# Patient Record
Sex: Male | Born: 1960 | Race: White | Hispanic: No | Smoking: Current every day smoker
Health system: Southern US, Community
[De-identification: ages and names within clinical notes are randomized; demographics above are authoritative.]

## PROBLEM LIST (undated history)

## (undated) DIAGNOSIS — R0600 Dyspnea, unspecified: Secondary | ICD-10-CM

## (undated) DIAGNOSIS — J449 Chronic obstructive pulmonary disease, unspecified: Secondary | ICD-10-CM

## (undated) DIAGNOSIS — M1990 Unspecified osteoarthritis, unspecified site: Secondary | ICD-10-CM

## (undated) DIAGNOSIS — R519 Headache, unspecified: Secondary | ICD-10-CM

## (undated) DIAGNOSIS — Z8719 Personal history of other diseases of the digestive system: Secondary | ICD-10-CM

## (undated) DIAGNOSIS — F209 Schizophrenia, unspecified: Secondary | ICD-10-CM

## (undated) DIAGNOSIS — M5090 Cervical disc disorder, unspecified, unspecified cervical region: Secondary | ICD-10-CM

## (undated) DIAGNOSIS — I10 Essential (primary) hypertension: Secondary | ICD-10-CM

## (undated) DIAGNOSIS — F4310 Post-traumatic stress disorder, unspecified: Secondary | ICD-10-CM

## (undated) DIAGNOSIS — K649 Unspecified hemorrhoids: Secondary | ICD-10-CM

## (undated) DIAGNOSIS — K219 Gastro-esophageal reflux disease without esophagitis: Secondary | ICD-10-CM

## (undated) DIAGNOSIS — T783XXA Angioneurotic edema, initial encounter: Secondary | ICD-10-CM

## (undated) DIAGNOSIS — N429 Disorder of prostate, unspecified: Secondary | ICD-10-CM

## (undated) HISTORY — DX: Essential (primary) hypertension: I10

## (undated) HISTORY — DX: Disorder of prostate, unspecified: N42.9

## (undated) HISTORY — DX: Unspecified osteoarthritis, unspecified site: M19.90

## (undated) HISTORY — PX: FOOT SURGERY: SHX648

## (undated) HISTORY — PX: EYE SURGERY: SHX253

## (undated) HISTORY — DX: Chronic obstructive pulmonary disease, unspecified: J44.9

---
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
## 2005-03-15 ENCOUNTER — Emergency Department: Payer: Self-pay | Admitting: Emergency Medicine

## 2005-07-20 ENCOUNTER — Emergency Department: Payer: Self-pay | Admitting: Internal Medicine

## 2005-09-26 ENCOUNTER — Ambulatory Visit: Payer: Self-pay | Admitting: Unknown Physician Specialty

## 2005-09-26 IMAGING — RF DG UGI W/ KUB
2 series · 15 of 24 positions shown · non-contrast
Comparison: none

REASON FOR EXAM: Abdominal pain, epigastric
COMMENTS:

[Series 1: run · 20 acquisitions, 14 frames shown]
[im 1/20]
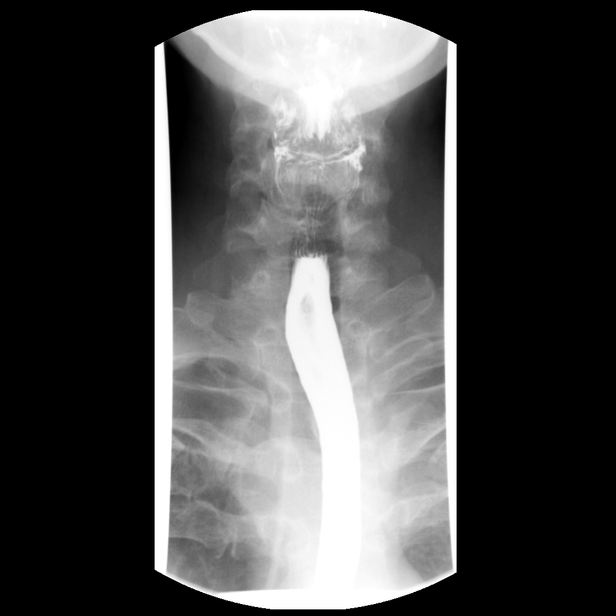
[im 1/20]
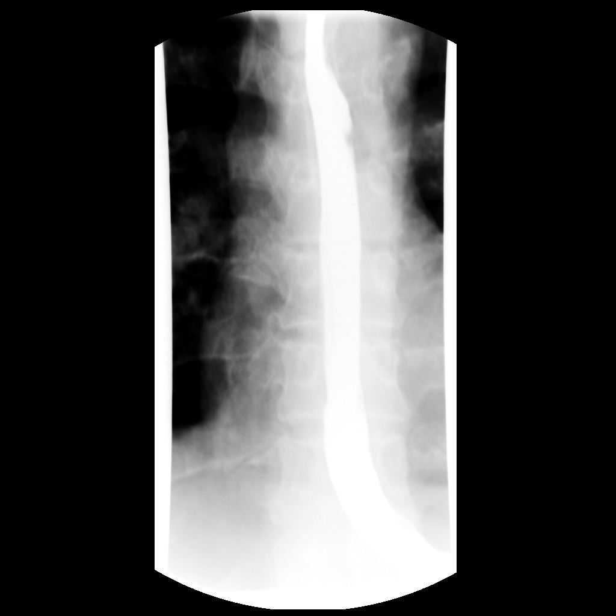
[im 2/20]
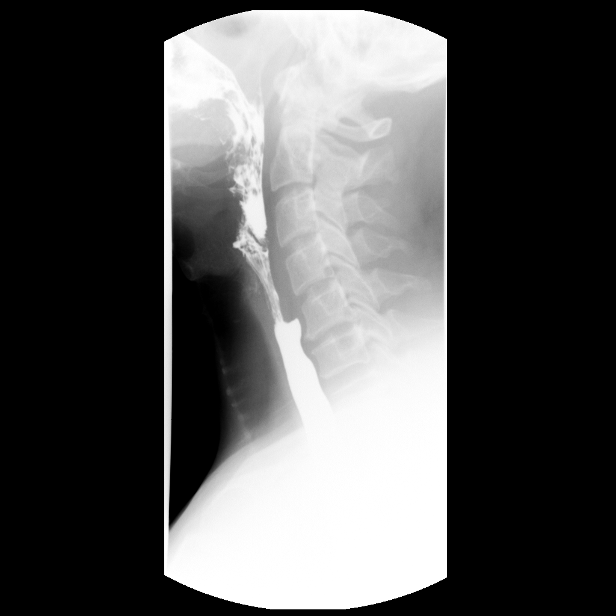
[im 2/20]
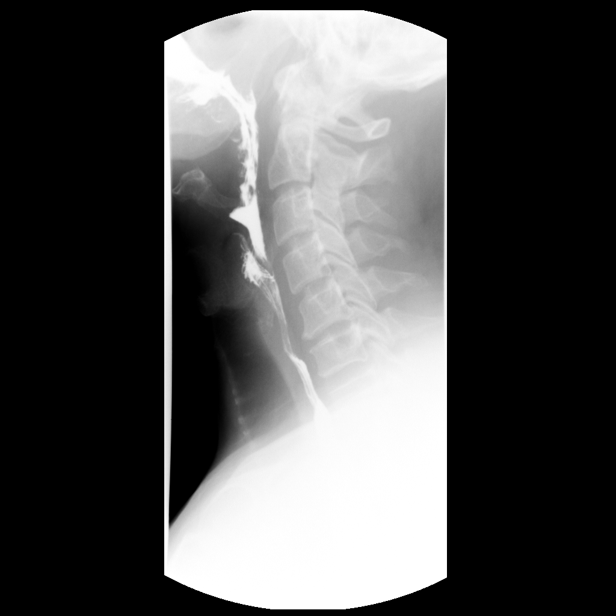
[im 3/20]
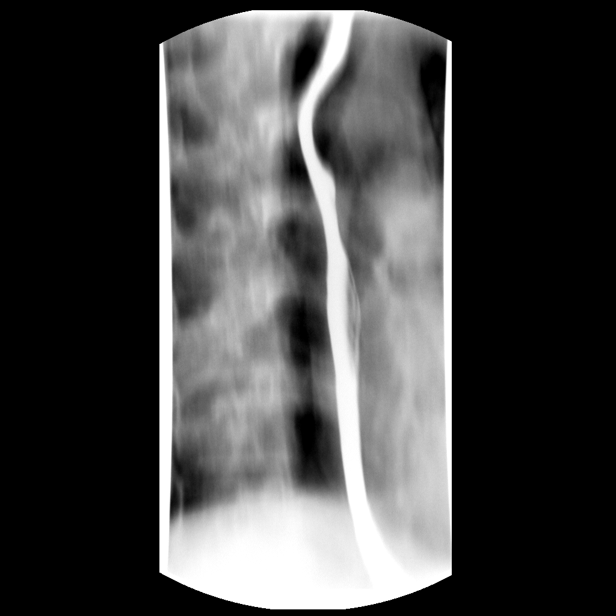
[im 3/20]
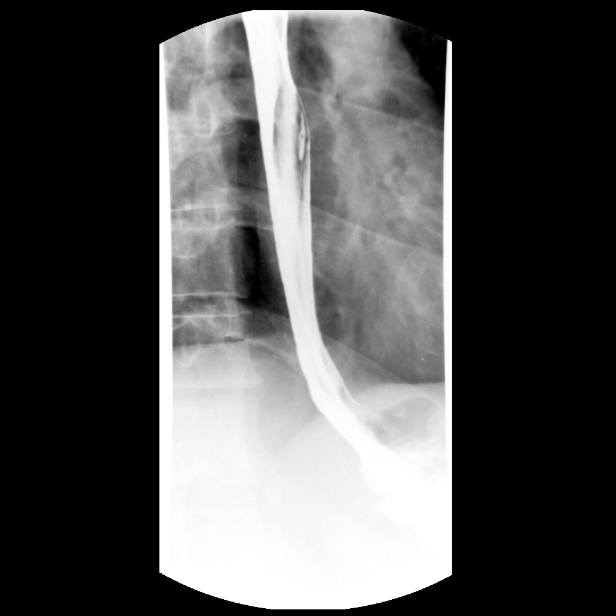
[im 6/20]
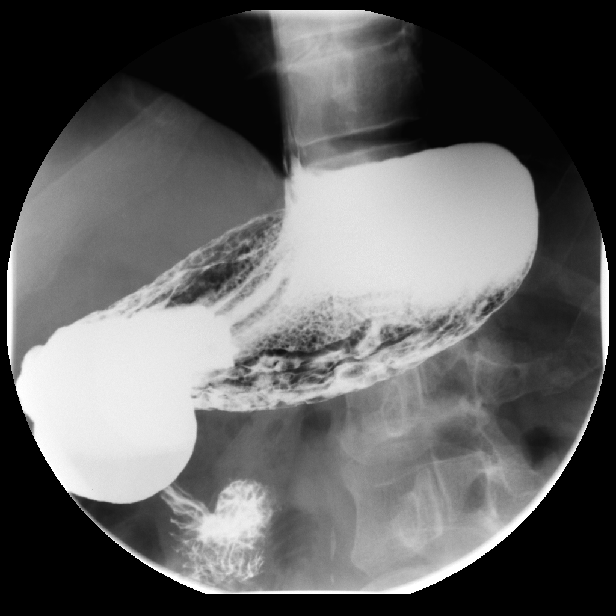
[im 8/20]
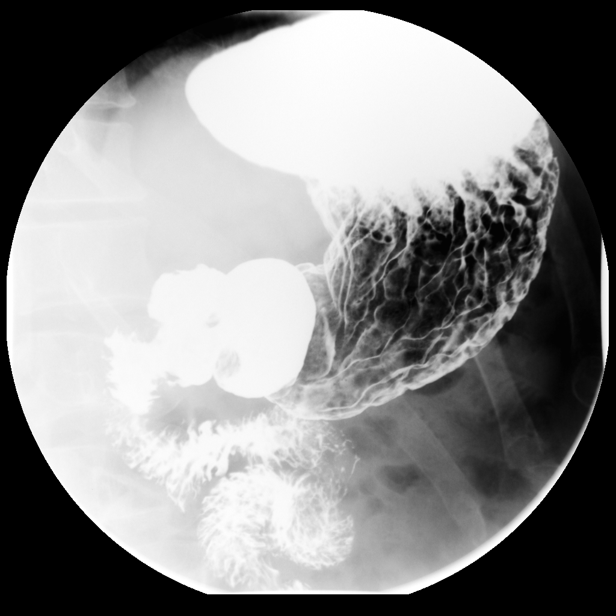
[im 9/20]
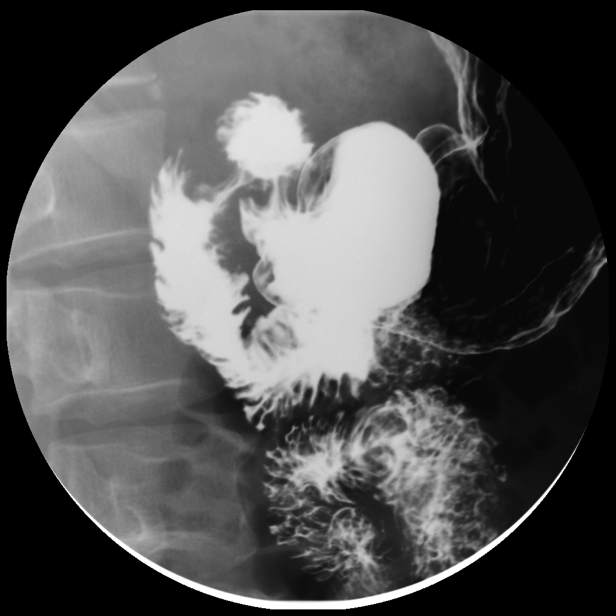
[im 12/20]
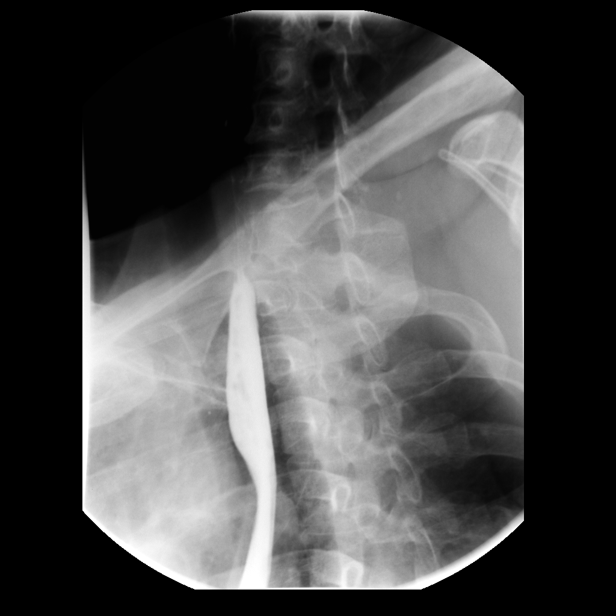
[im 13/20]
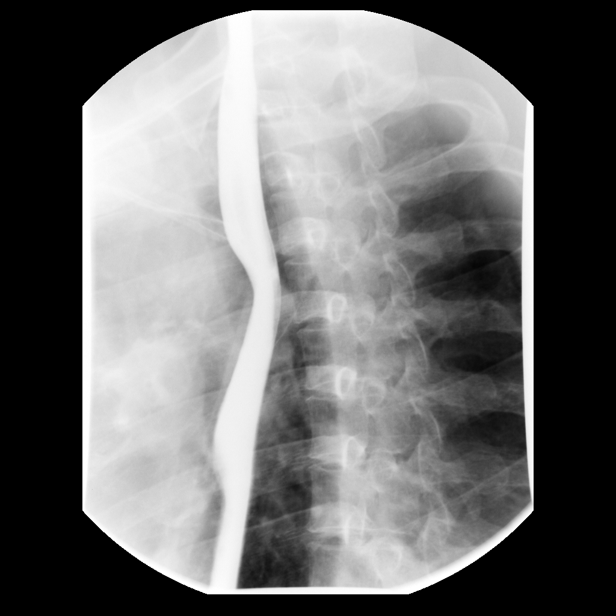
[im 15/20]
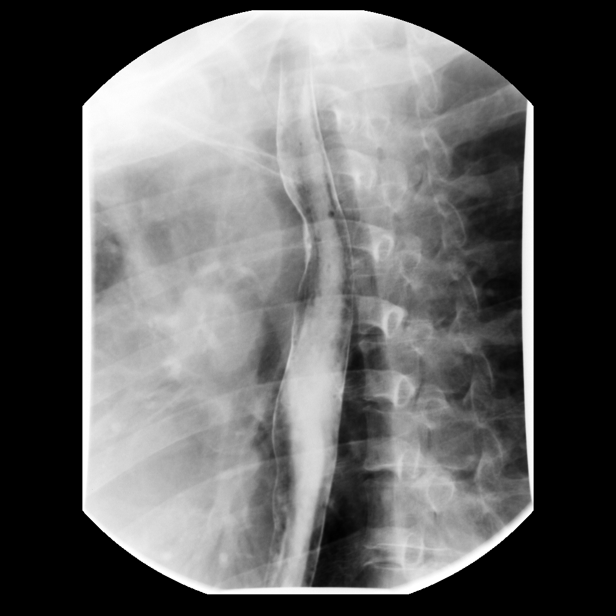
[im 17/20]
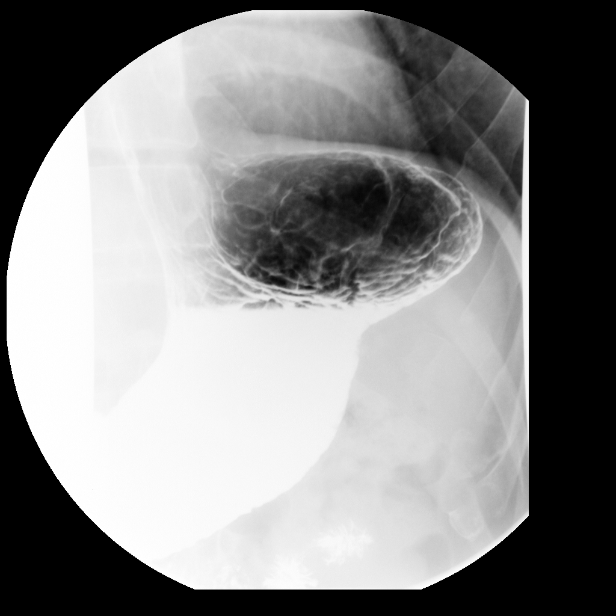
[im 19/20]
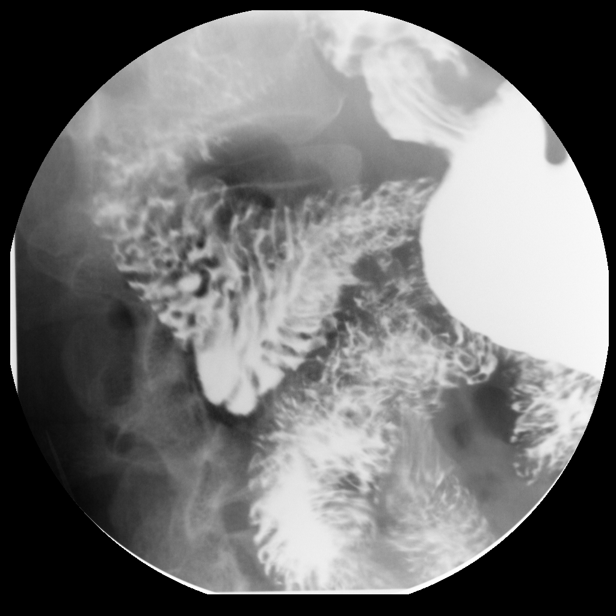

[Series 1: view not recorded · 0.17mm/px · 1 of 1 slices shown]
[im 1/1]
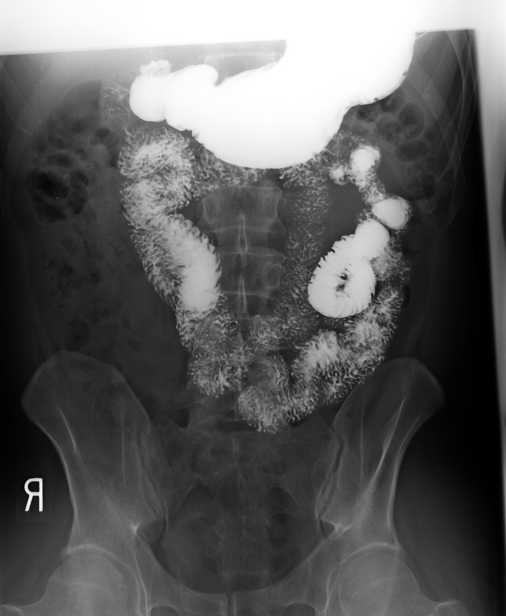

[15 of 24 positions shown; findings below may reference images not displayed]

PROCEDURE:     FL  - FL UPPER GI W/ BARIUM SWALLOW  - [DATE]  [DATE]

RESULT:     Double-contrast upper GI examination with barium swallow shows
the patient easily ingests the liquid barium.  The esophageal mucosa and
morphology are normal.  There is no stenosis. The barium flows into the
stomach.  The stomach distends fully.  The gastric folds are mildly
prominent as are the duodenal folds which can be consistent with a mild
gastroduodenitis.  No ulceration is evident.  No gastroesophageal reflux can
be elicited.
IMPRESSION: Mild gastroduodenitis.  Otherwise, unremarkable study.

## 2005-10-10 ENCOUNTER — Emergency Department: Payer: Self-pay | Admitting: Emergency Medicine

## 2006-11-30 ENCOUNTER — Emergency Department: Payer: Self-pay | Admitting: Emergency Medicine

## 2007-01-19 ENCOUNTER — Emergency Department: Payer: Self-pay | Admitting: Emergency Medicine

## 2007-02-04 ENCOUNTER — Emergency Department: Payer: Self-pay | Admitting: Emergency Medicine

## 2007-02-04 IMAGING — CR DG CHEST 2V
1 series · 2 of 2 positions shown · non-contrast
Comparison: none

REASON FOR EXAM: Injury, pain
COMMENTS:

PROCEDURE:     DXR - DXR CHEST PA (OR AP) AND LATERAL  - [DATE]  [DATE]
RESULT:     The lung fields are clear. The heart, mediastinal and osseous
structures show no significant abnormalities. The chest is mildly
hyperexpanded suspicious for a history of COPD or asthma.

[Series 1: view not recorded · 0.17mm/px · 2 of 2 slices shown]
[im 1/2]
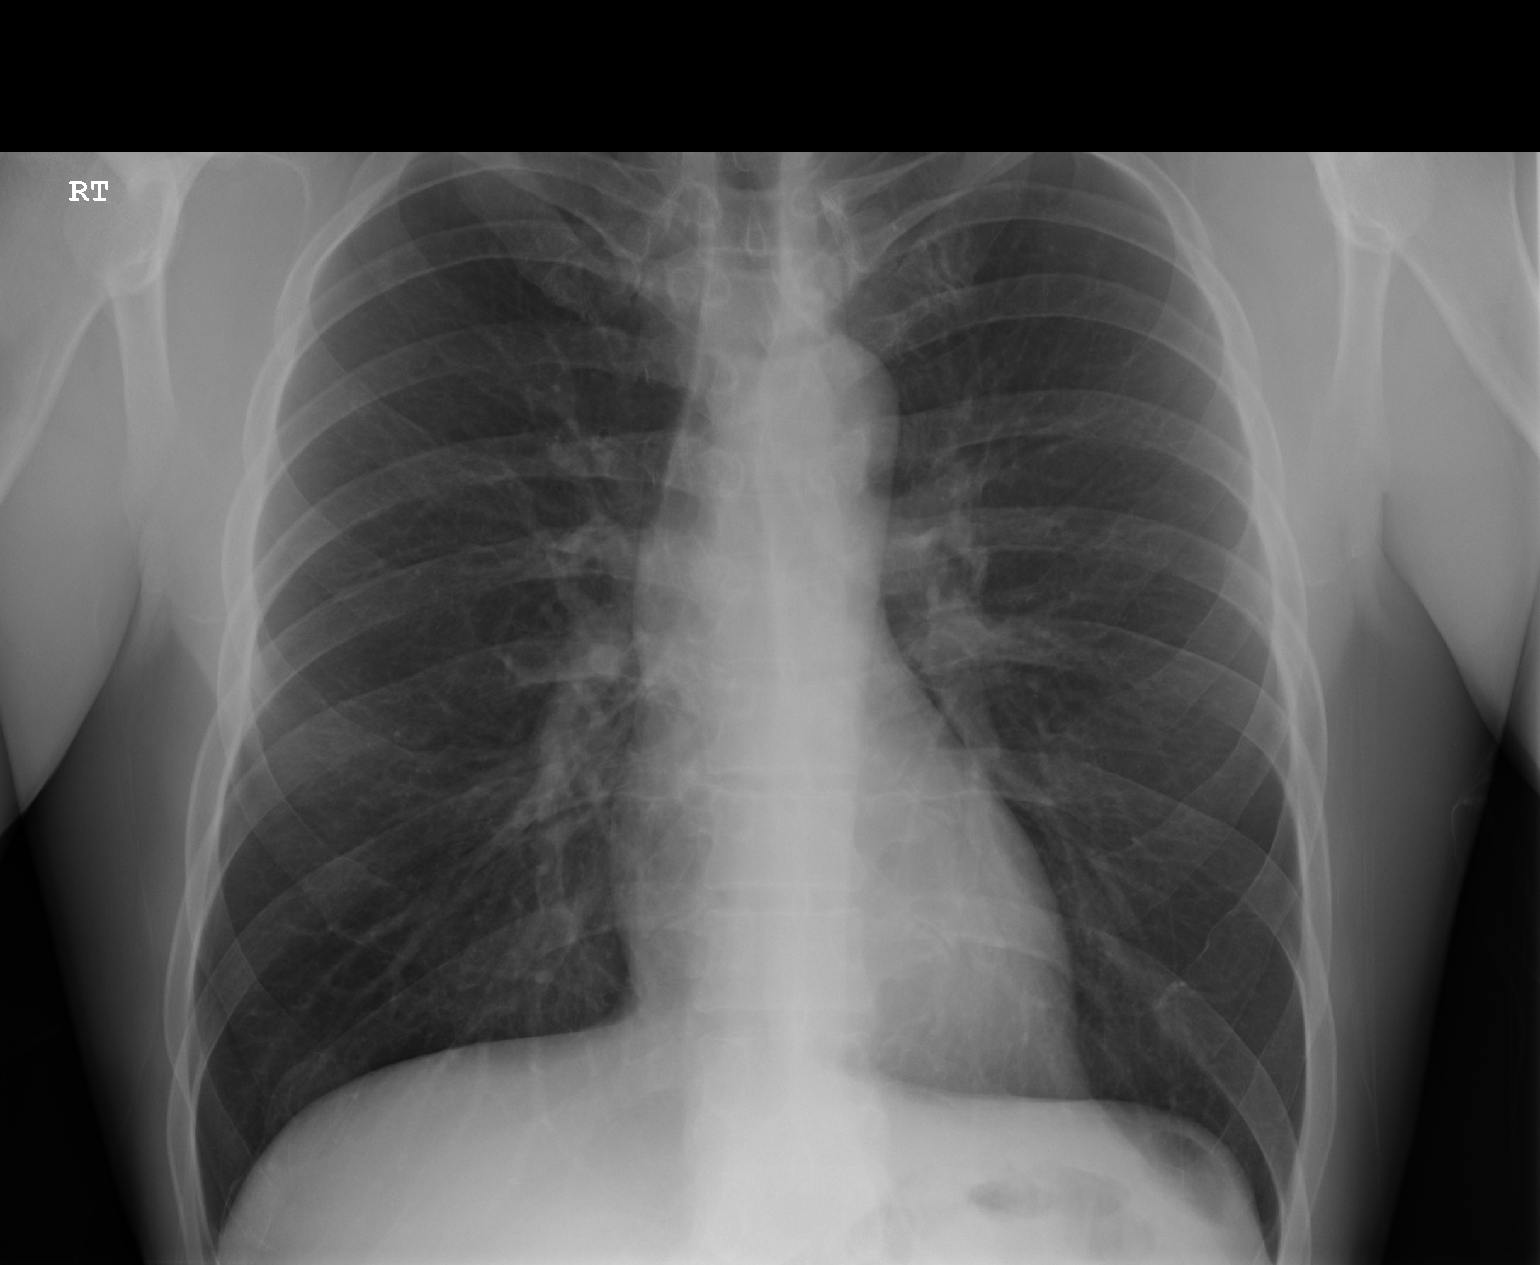
[im 2/2]
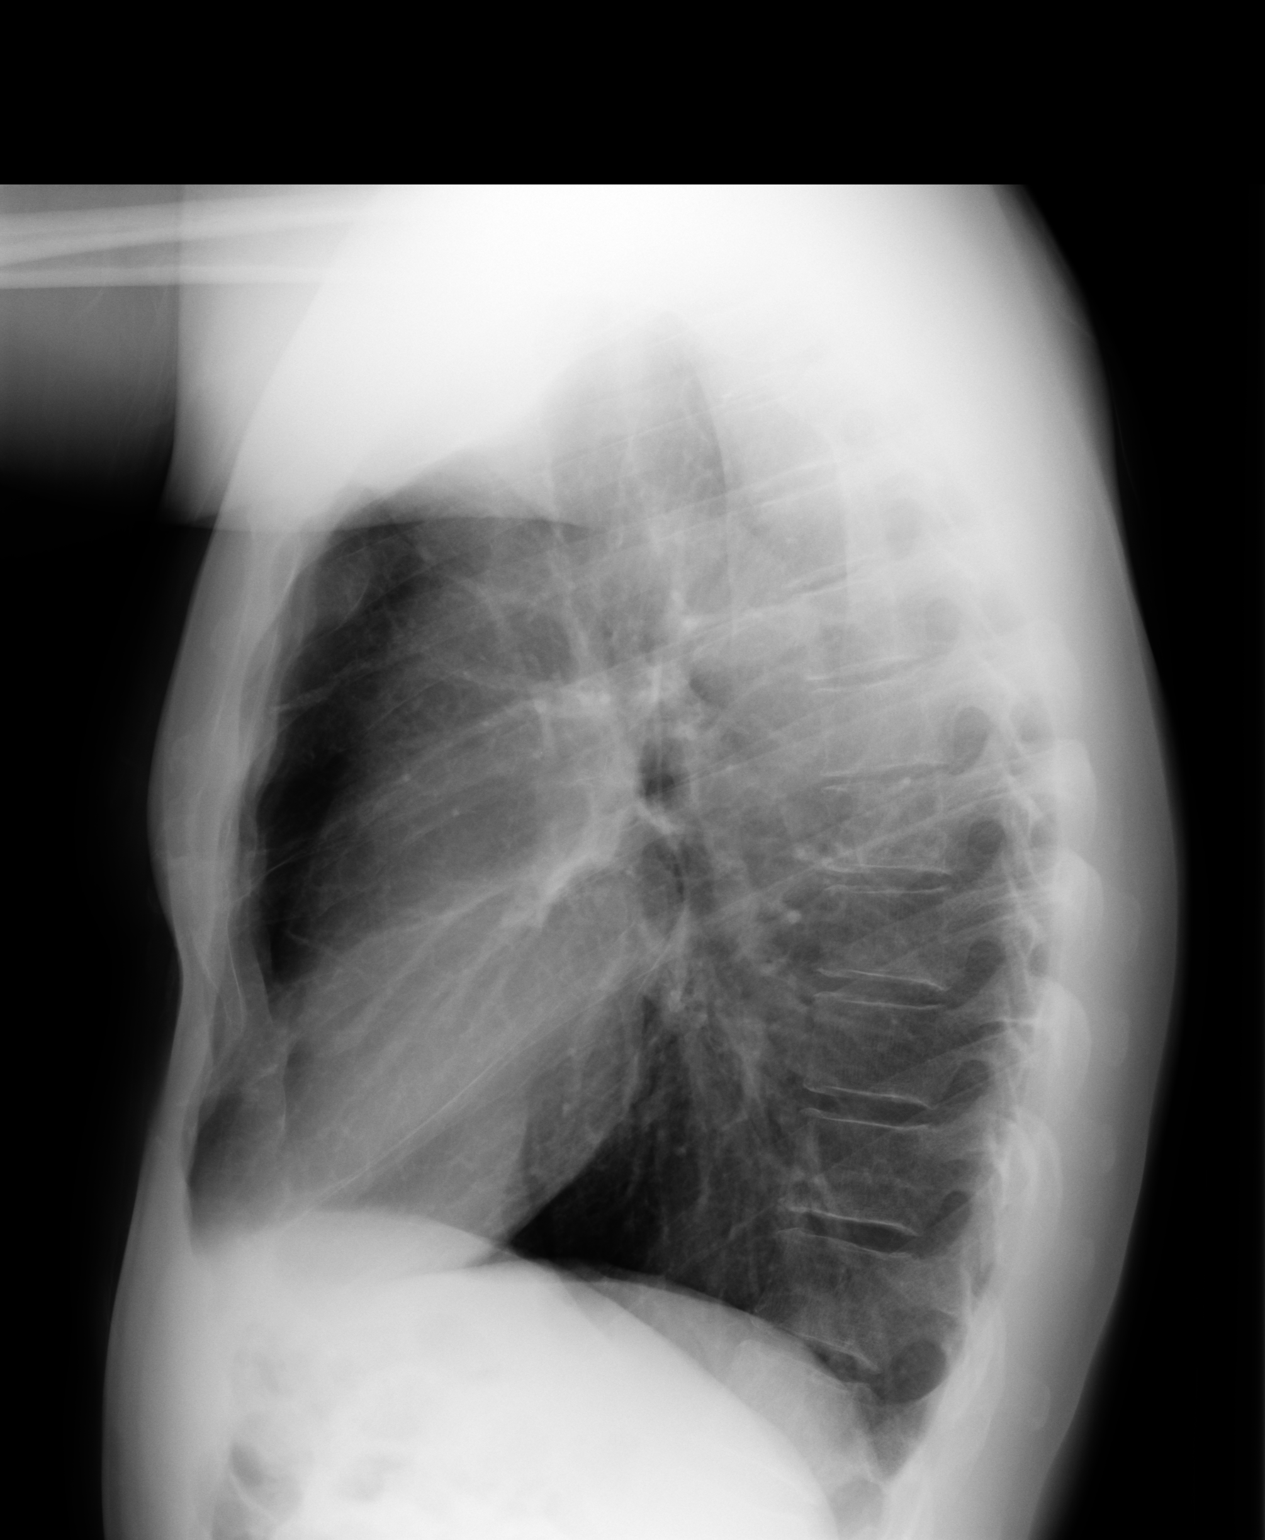

[2 of 2 positions shown; findings below may reference images not displayed]

IMPRESSION: 1.  The lung fields are clear.
2.  No acute bony abnormalities are identified.
3.  The chest appears mildly hyperexpanded.

## 2007-12-17 ENCOUNTER — Other Ambulatory Visit: Payer: Self-pay

## 2007-12-17 ENCOUNTER — Emergency Department: Payer: Self-pay | Admitting: Emergency Medicine

## 2007-12-17 IMAGING — CR DG CHEST 2V
1 series · 2 of 2 positions shown · non-contrast
Comparison: none

REASON FOR EXAM: hemoptysis
COMMENTS:

[Series 1: view not recorded · 0.17mm/px · 2 of 2 slices shown]
[im 1/2]
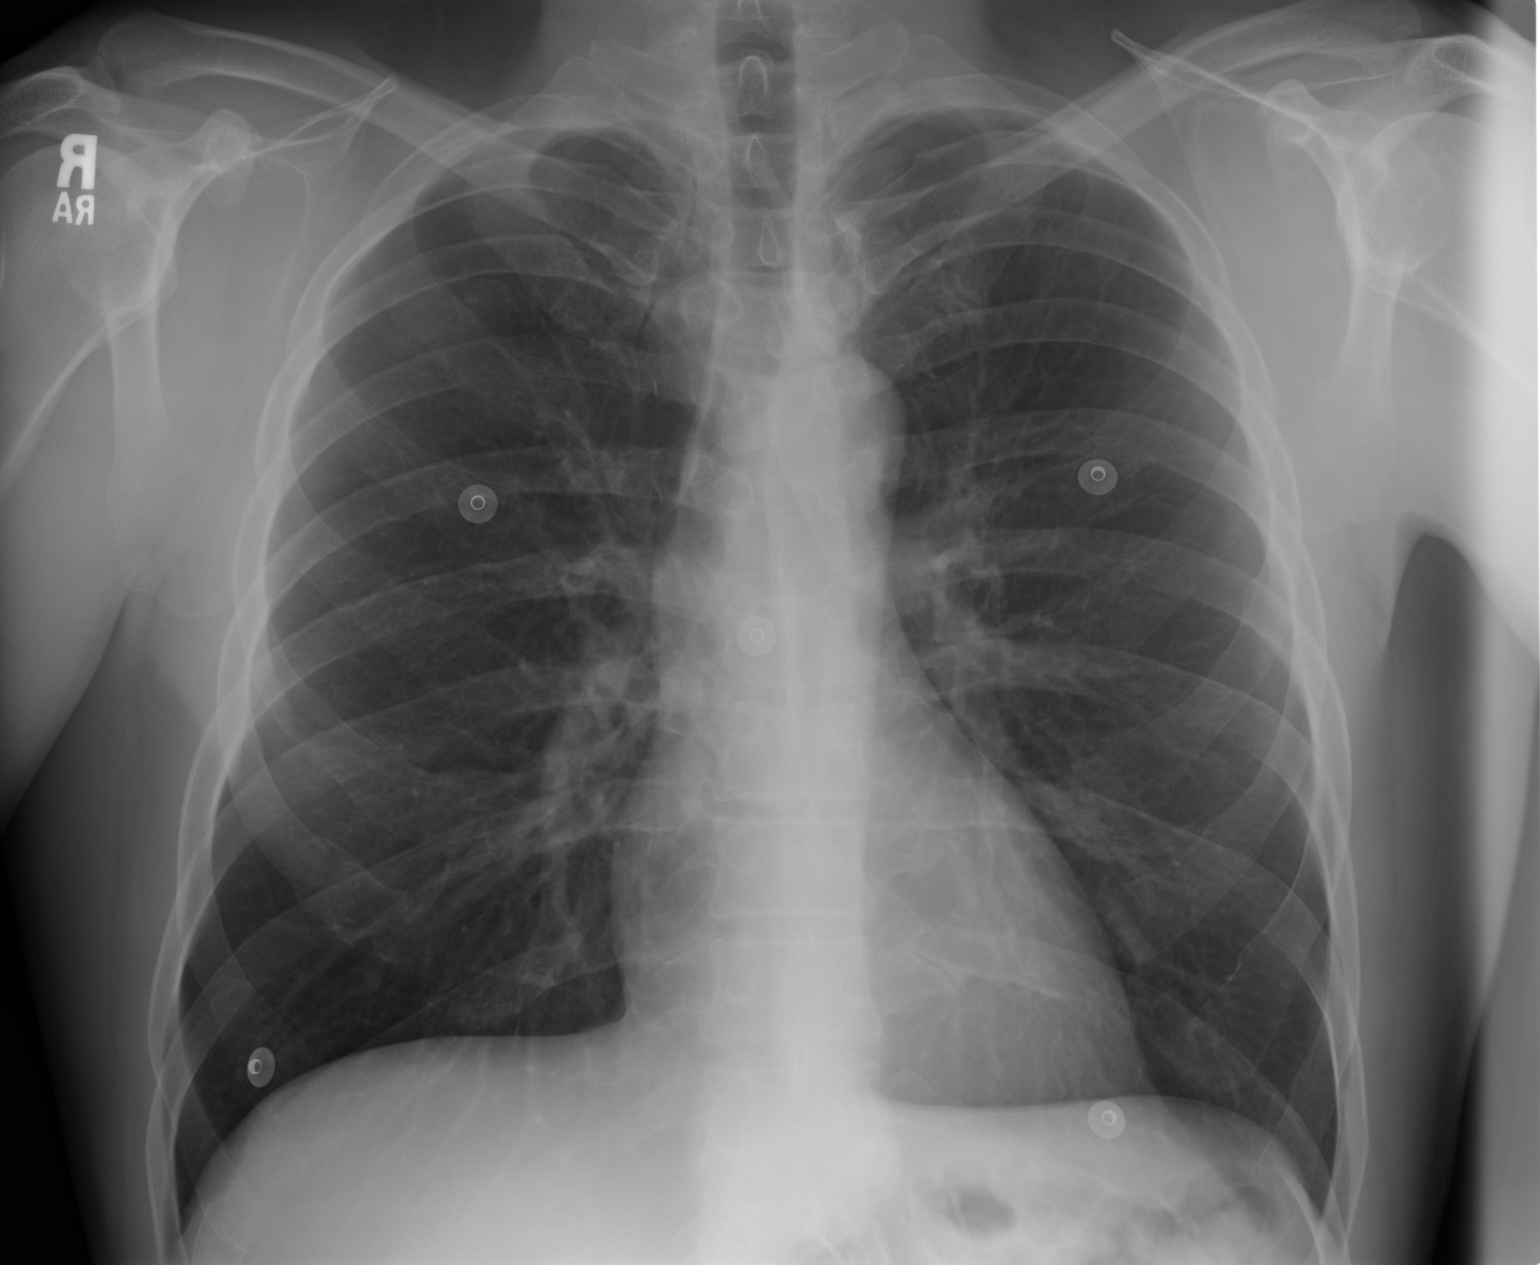
[im 2/2]
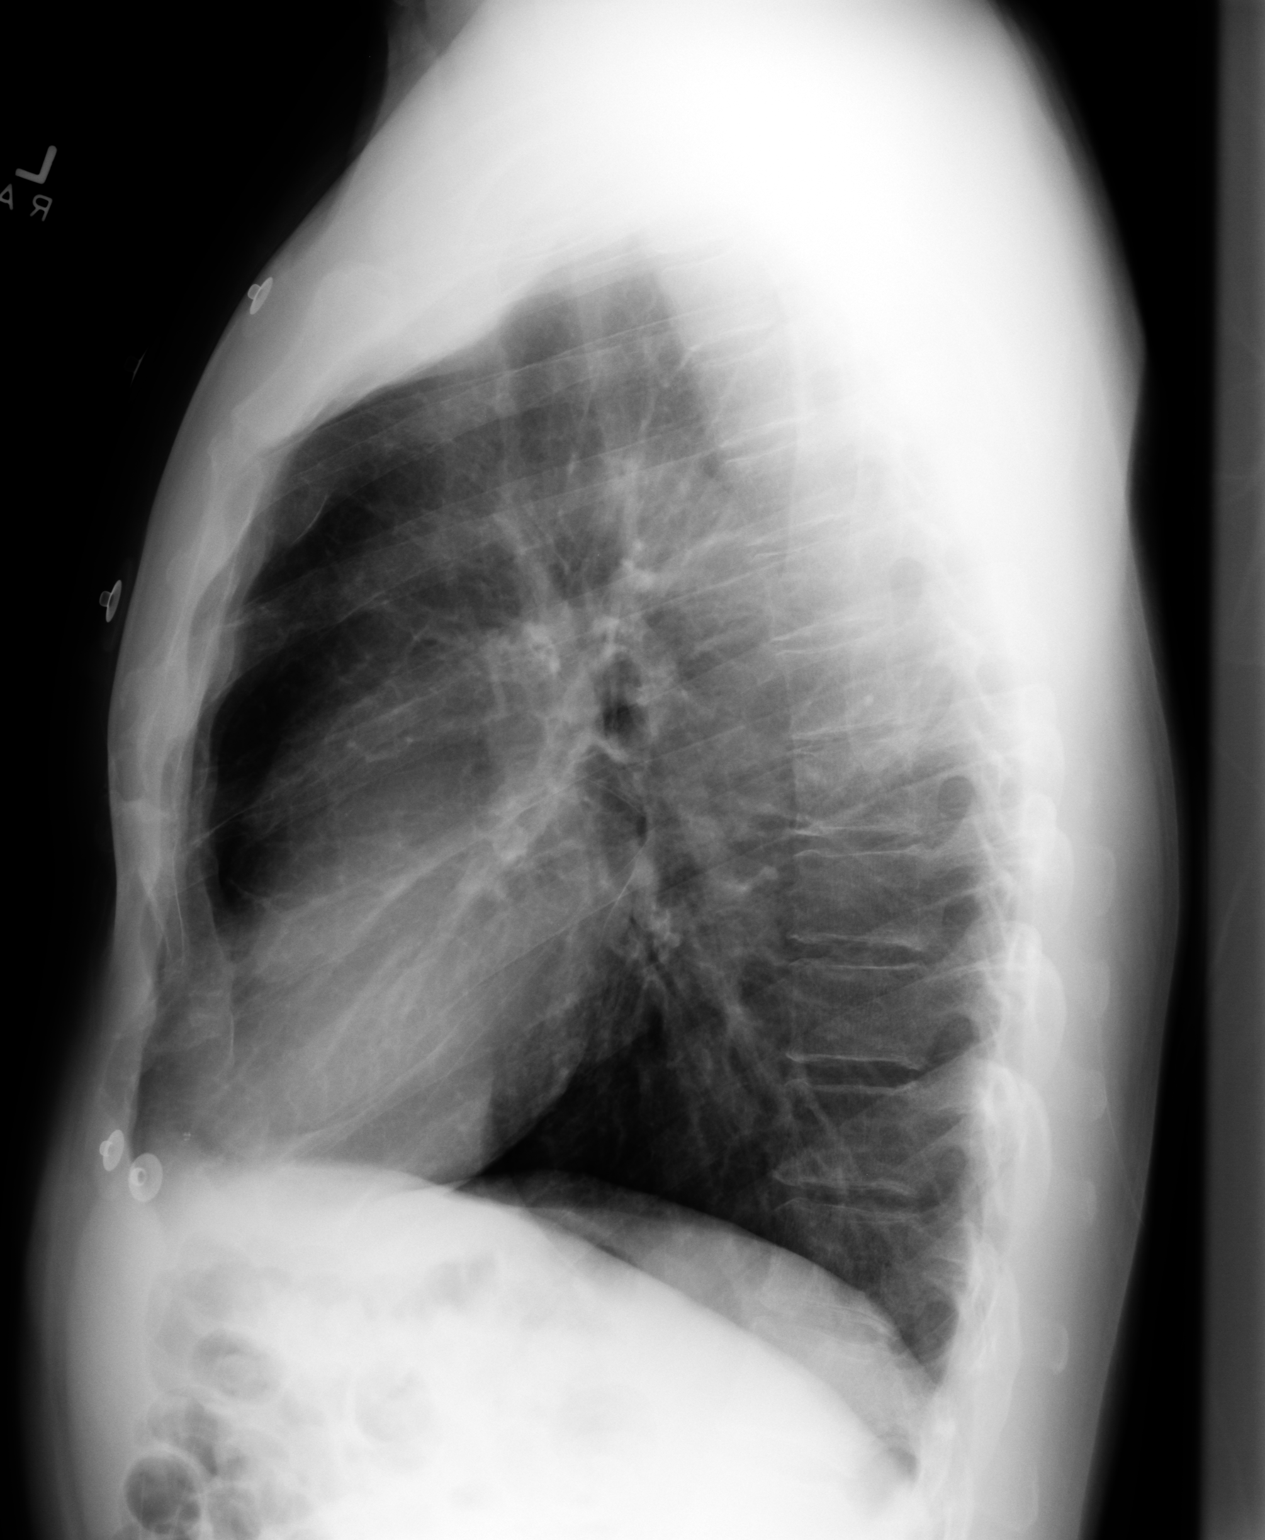

[2 of 2 positions shown; findings below may reference images not displayed]

PROCEDURE:     DXR - DXR CHEST PA (OR AP) AND LATERAL  - [DATE] [DATE]

RESULT:     Comparison is made to the exam dated [DATE].

The lungs are mildly hyperinflated. The lungs are clear. The heart and
pulmonary vessels are normal. The bony and mediastinal structures are
unremarkable. There is no effusion. There is no pneumothorax or evidence of
congestive failure.
IMPRESSION: No acute cardiopulmonary disease. There is mild
hyperinflation as noted previously.

## 2008-03-09 ENCOUNTER — Inpatient Hospital Stay: Payer: Self-pay | Admitting: Psychiatry

## 2008-04-11 ENCOUNTER — Emergency Department: Payer: Self-pay | Admitting: Emergency Medicine

## 2008-04-11 IMAGING — CT CT HEAD WITHOUT CONTRAST
1 series · 16 of 30 positions shown, 20 images · non-contrast
Comparison: none

REASON FOR EXAM: severe episodic headache
COMMENTS:

[Series 2: soft tissue · axial · 0.42mm/px · z∈[-560,-424]mm · 16 of 31 slices shown, 20 images]
[im 2/31  brain]
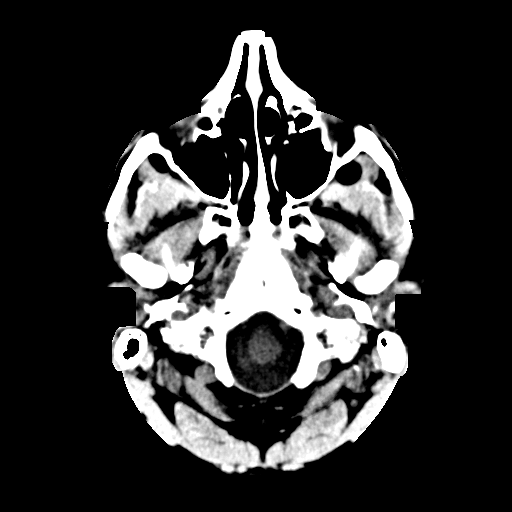
[im 2/31  bone]
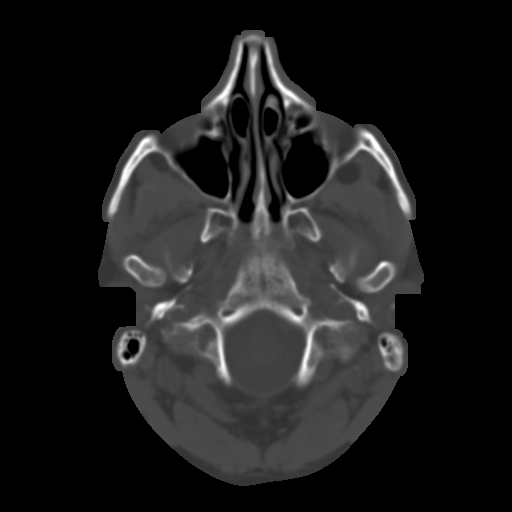
[im 4/31  brain]
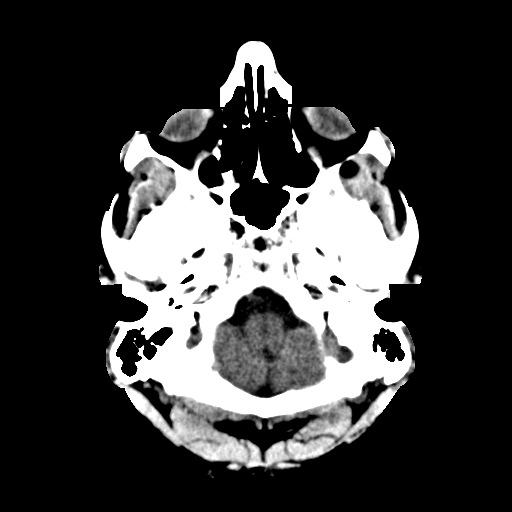
[im 6/31  brain]
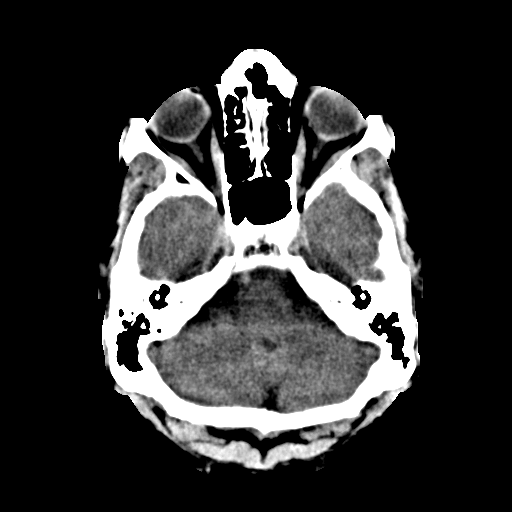
[im 8/31  brain]
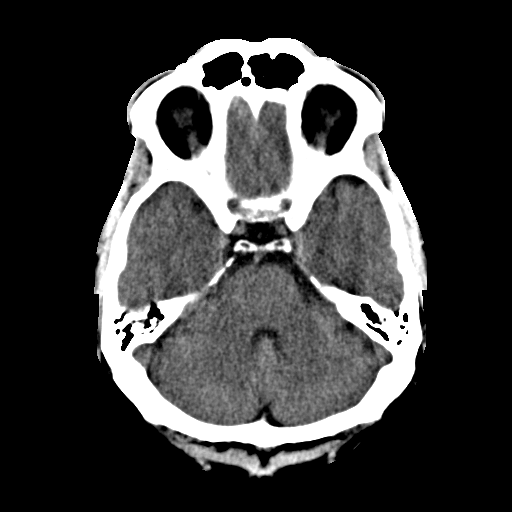
[im 9/31  brain]
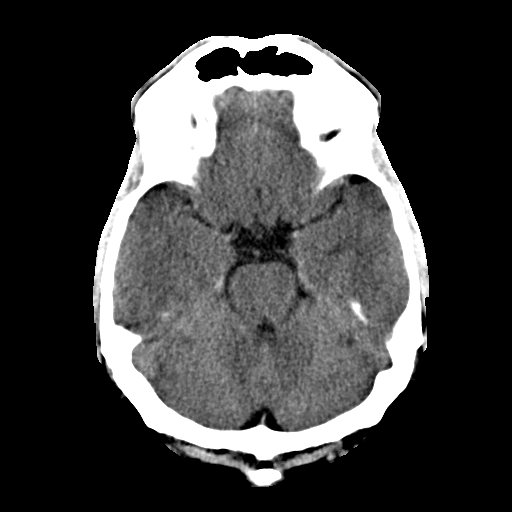
[im 9/31  bone]
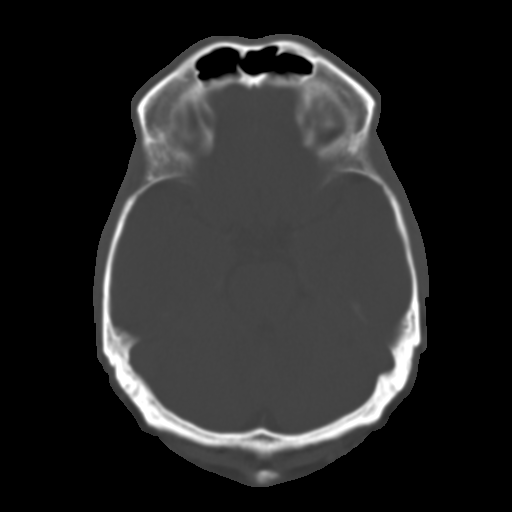
[im 11/31  brain]
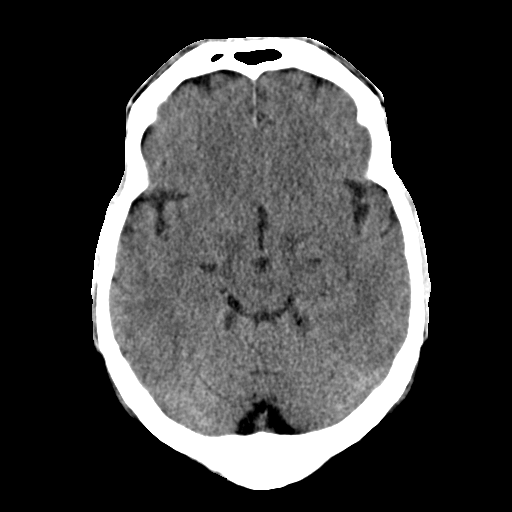
[im 13/31  brain]
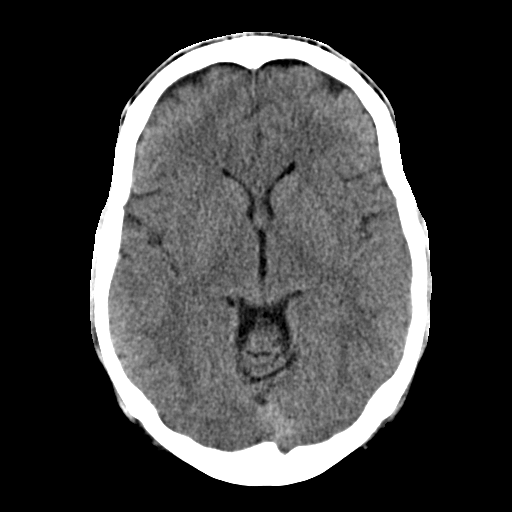
[im 15/31  brain]
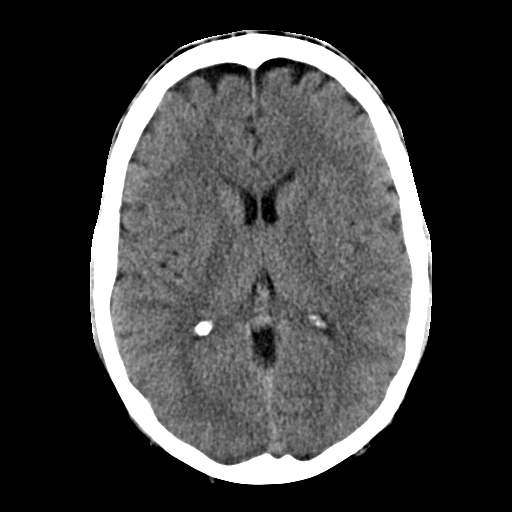
[im 16/31  brain]
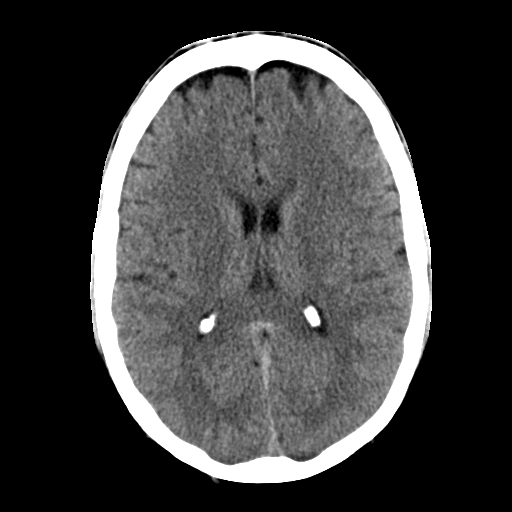
[im 16/31  bone]
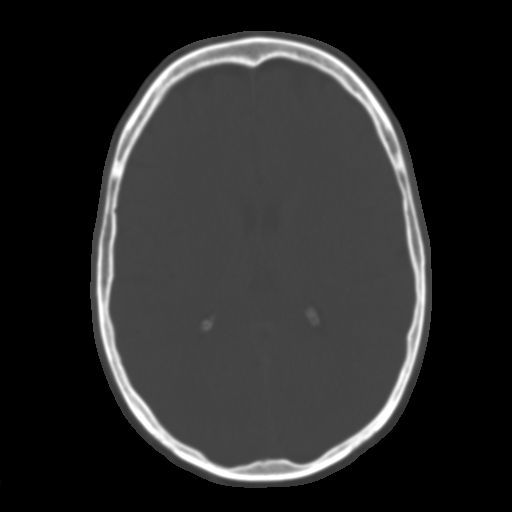
[im 18/31  brain]
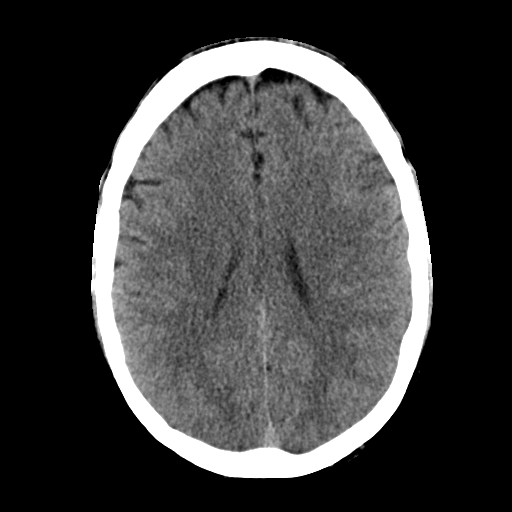
[im 20/31  brain]
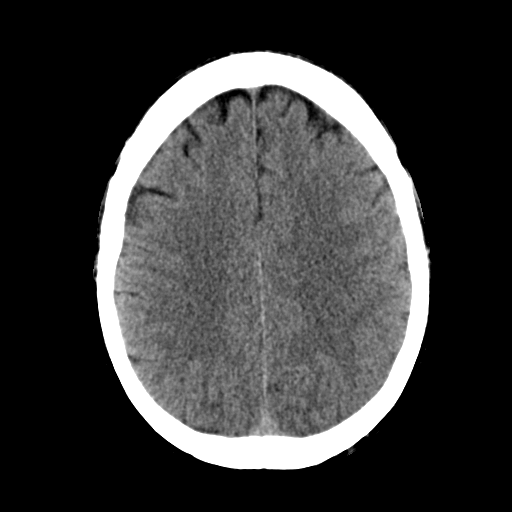
[im 22/31  brain]
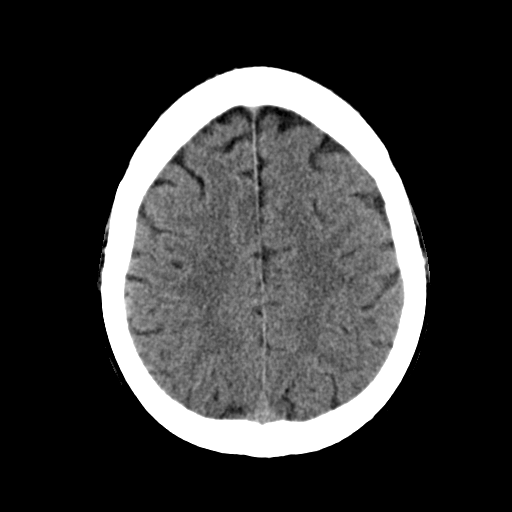
[im 23/31  brain]
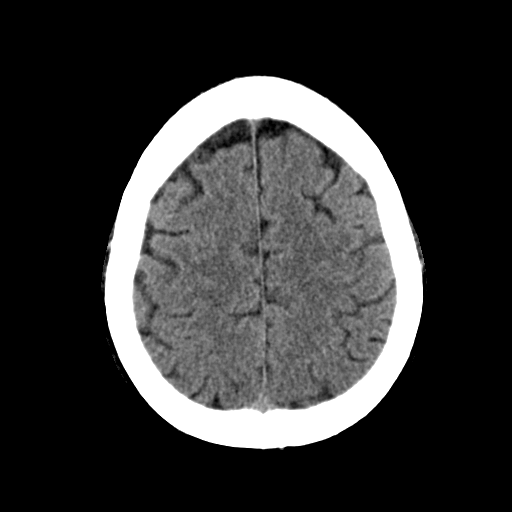
[im 23/31  bone]
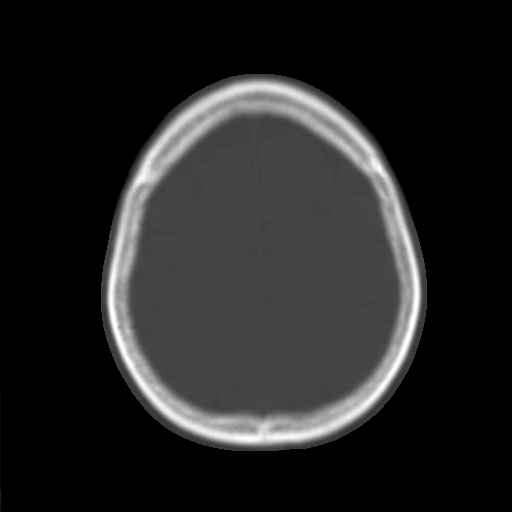
[im 25/31  brain]
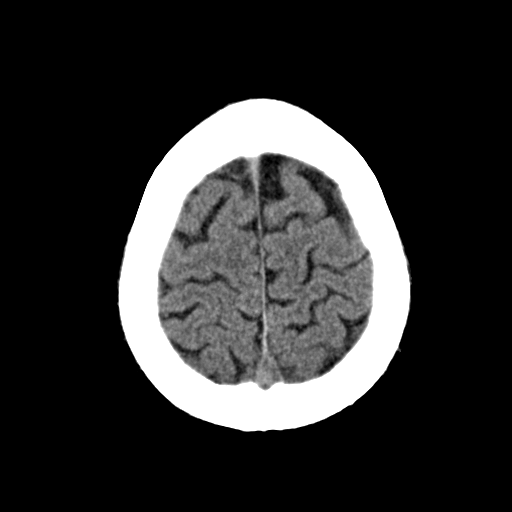
[im 27/31  brain]
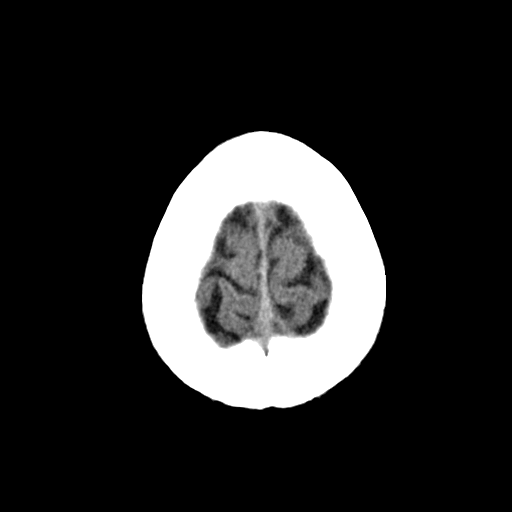
[im 29/31  brain]
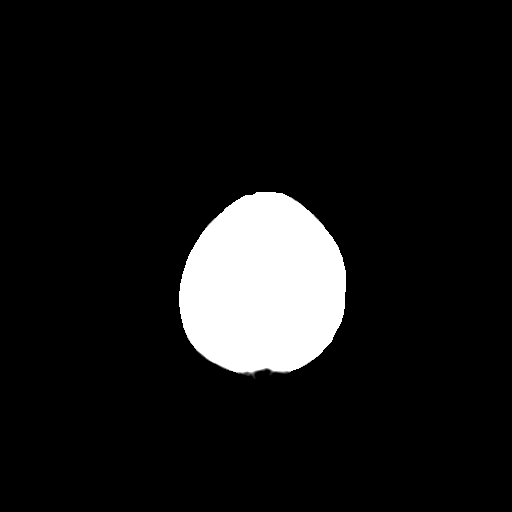

[16 of 30 positions shown; findings below may reference images not displayed]

PROCEDURE:     CT  - CT HEAD WITHOUT CONTRAST  - [DATE]  [DATE]

RESULT:     Emergent CT of the brain is performed without contrast. There is
no previous exam for comparison.

The ventricles and sulci are normal. There is no hemorrhage. There is no
focal mass, mass-effect or midline shift. There is no evidence of edema or
territorial infarct. The bone windows demonstrate normal aeration of the
paranasal sinuses and mastoid air cells. There is no skull fracture
demonstrated.
IMPRESSION: 1. No acute intracranial abnormality.

## 2008-04-11 IMAGING — CT CT ANGIOGRAPHY NECK
1 of 4 series · 12 of 33 positions shown · IV contrast (APPLIED)
Comparison: none

REASON FOR EXAM: pulsating pressure to right neck
COMMENTS:

[Series 5: soft tissue · axial · 0.41mm/px · z∈[-756,-472]mm · 12 of 113 slices shown]
[im 9/113  soft-tissue]
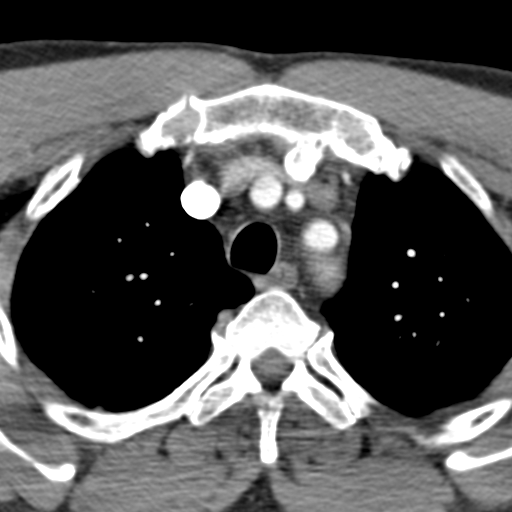
[im 18/113  bone]
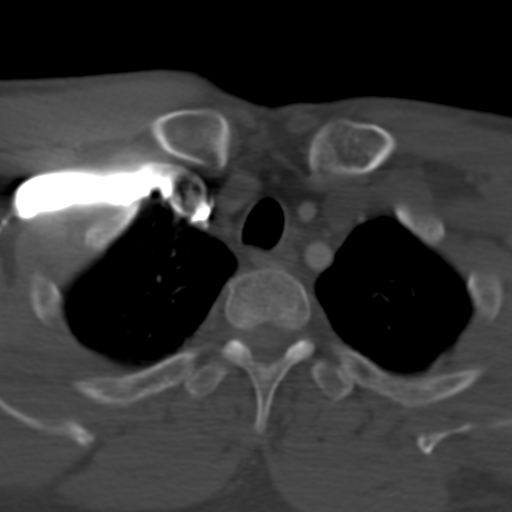
[im 26/113  soft-tissue]
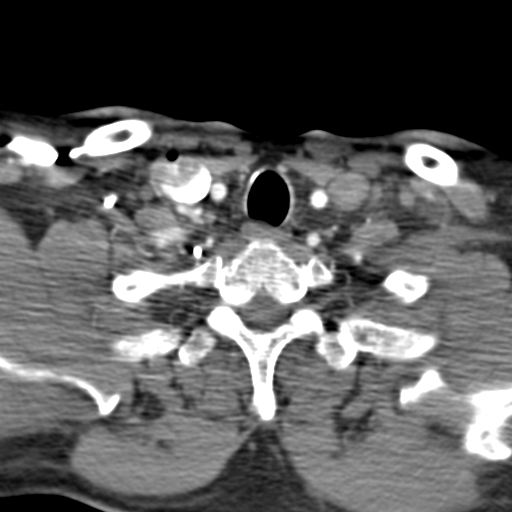
[im 35/113  bone]
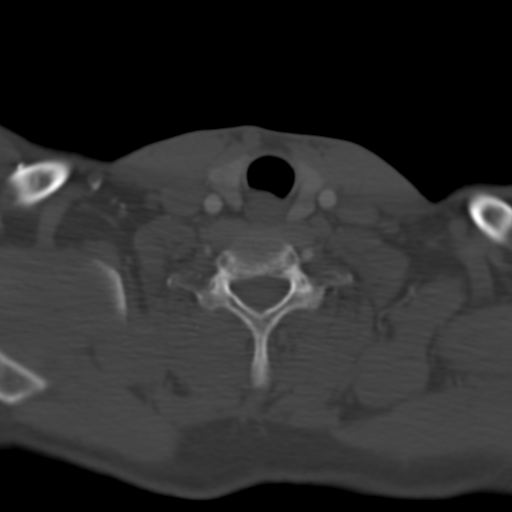
[im 44/113  soft-tissue]
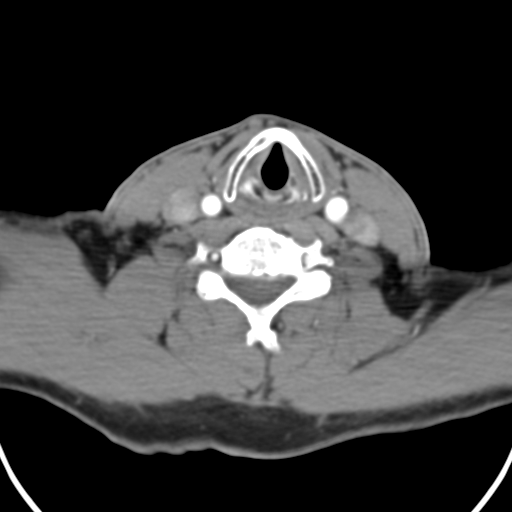
[im 52/113  bone]
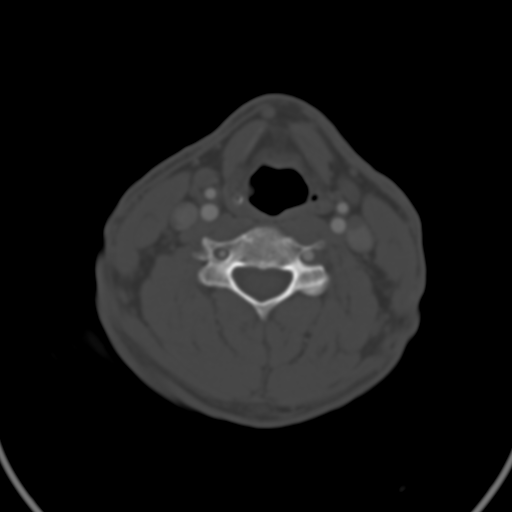
[im 61/113  soft-tissue]
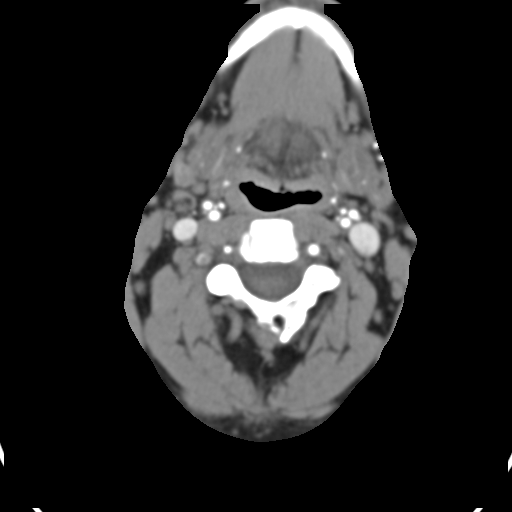
[im 69/113  bone]
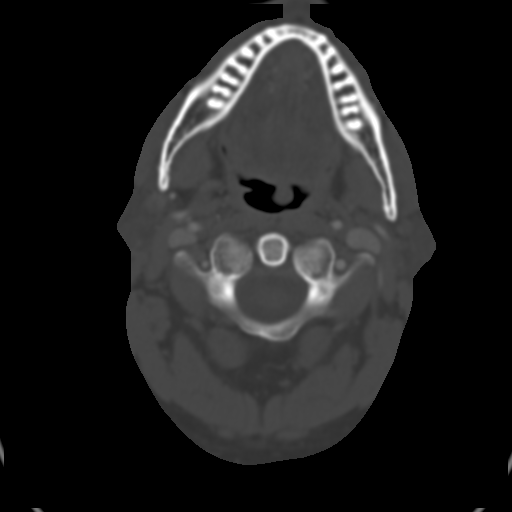
[im 78/113  soft-tissue]
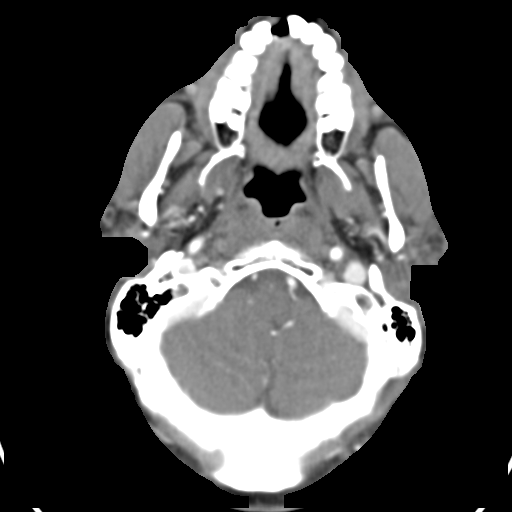
[im 87/113  bone]
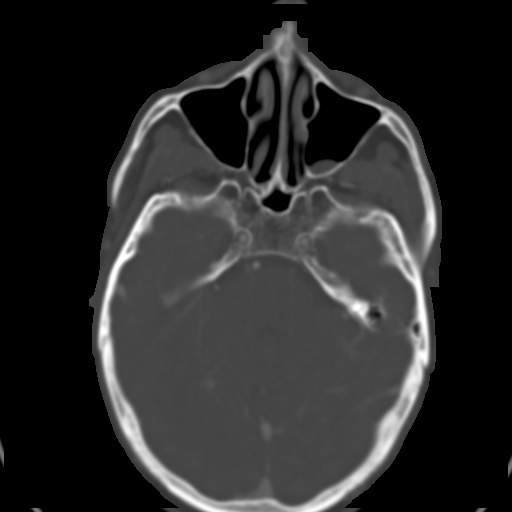
[im 95/113  soft-tissue]
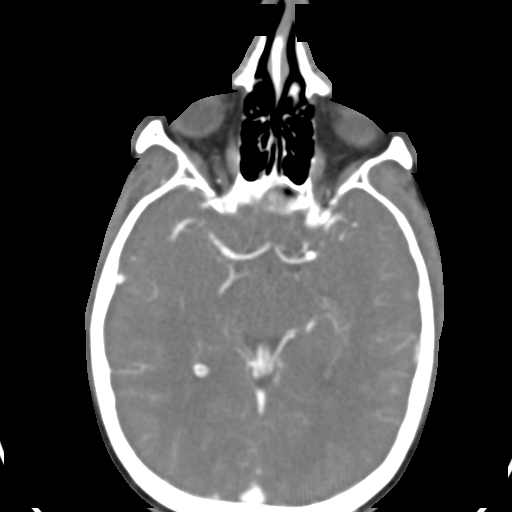
[im 104/113  bone]
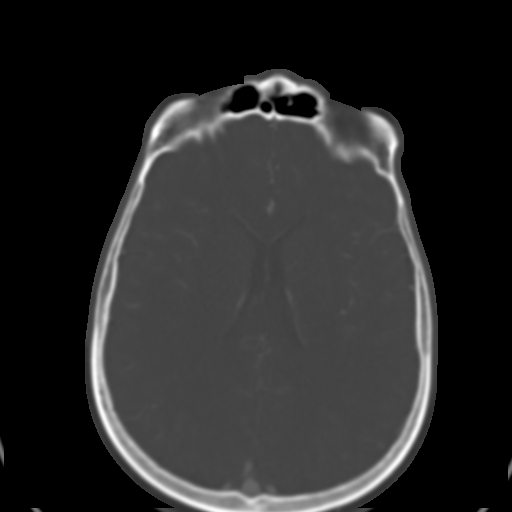

[12 of 33 positions shown; findings below may reference images not displayed]

PROCEDURE:     CT  - CT ANGIOGRAPHY NECK W/WO  - [DATE]  [DATE]

RESULT:     CTA of the carotids is performed. The patient received a bolus
of 100 ml of [ST] iodinated intravenous contrast for the exam.

 The examination demonstrates no evidence of stenosis. There is some venous
contamination in the superior portion of the neck in the jugular veins. The
thyroid lobes enhance normally. There is no aneurysm. The salivary glands
appear unremarkable. The sinuses appear to be clear. The intracranial
vessels included on the exam appear unremarkable.
IMPRESSION: No evidence of aneurysm. No significant stenosis.

## 2008-05-10 ENCOUNTER — Inpatient Hospital Stay: Payer: Self-pay | Admitting: Internal Medicine

## 2008-05-10 ENCOUNTER — Other Ambulatory Visit: Payer: Self-pay

## 2008-05-10 IMAGING — CR DG CHEST 2V
1 series · 2 of 2 positions shown · non-contrast
Comparison: none

REASON FOR EXAM: Chest Pain
COMMENTS:

PROCEDURE:     DXR - DXR CHEST PA (OR AP) AND LATERAL  - [DATE]  [DATE]
RESULT:     The lung fields are clear. No pneumonia, pneumothorax or pleural
effusion is seen. The heart size is normal.  The chest is hyperexpanded
compatible with a history of COPD or asthma.

[Series 1: view not recorded · 0.17mm/px · 2 of 2 slices shown]
[im 1/2]
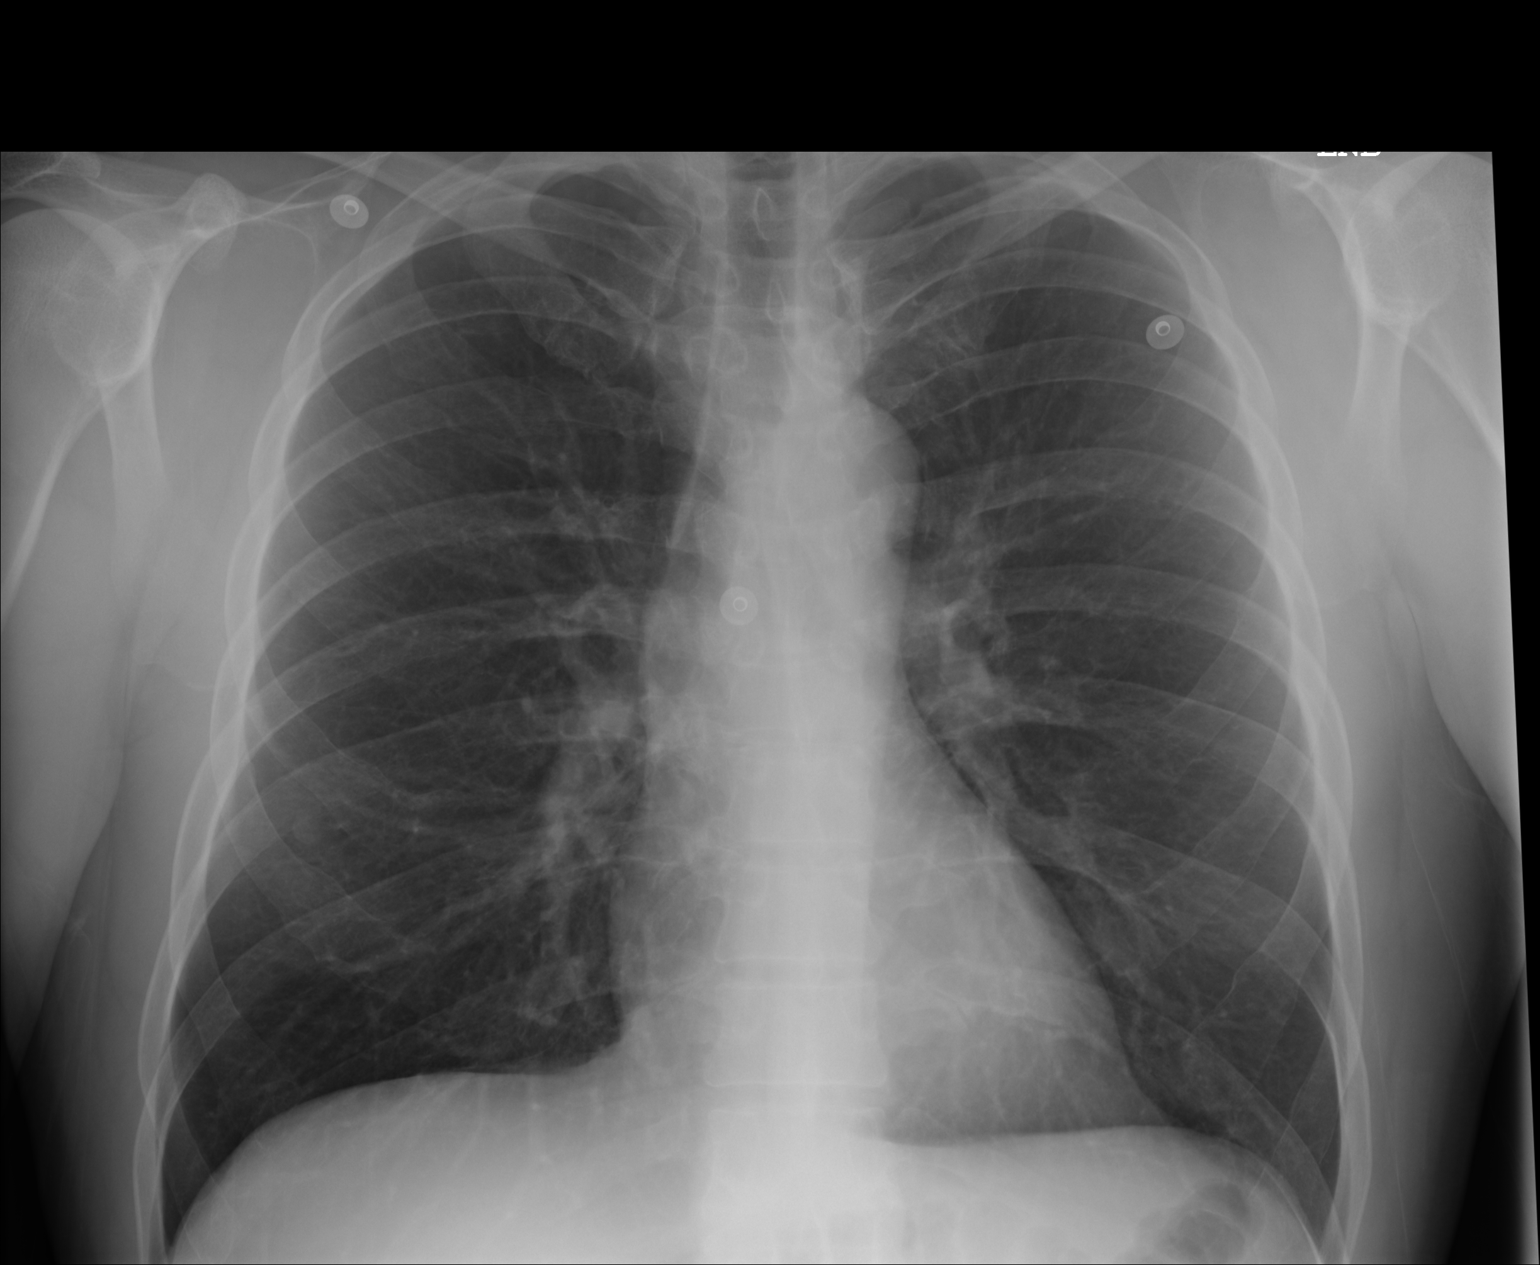
[im 2/2]
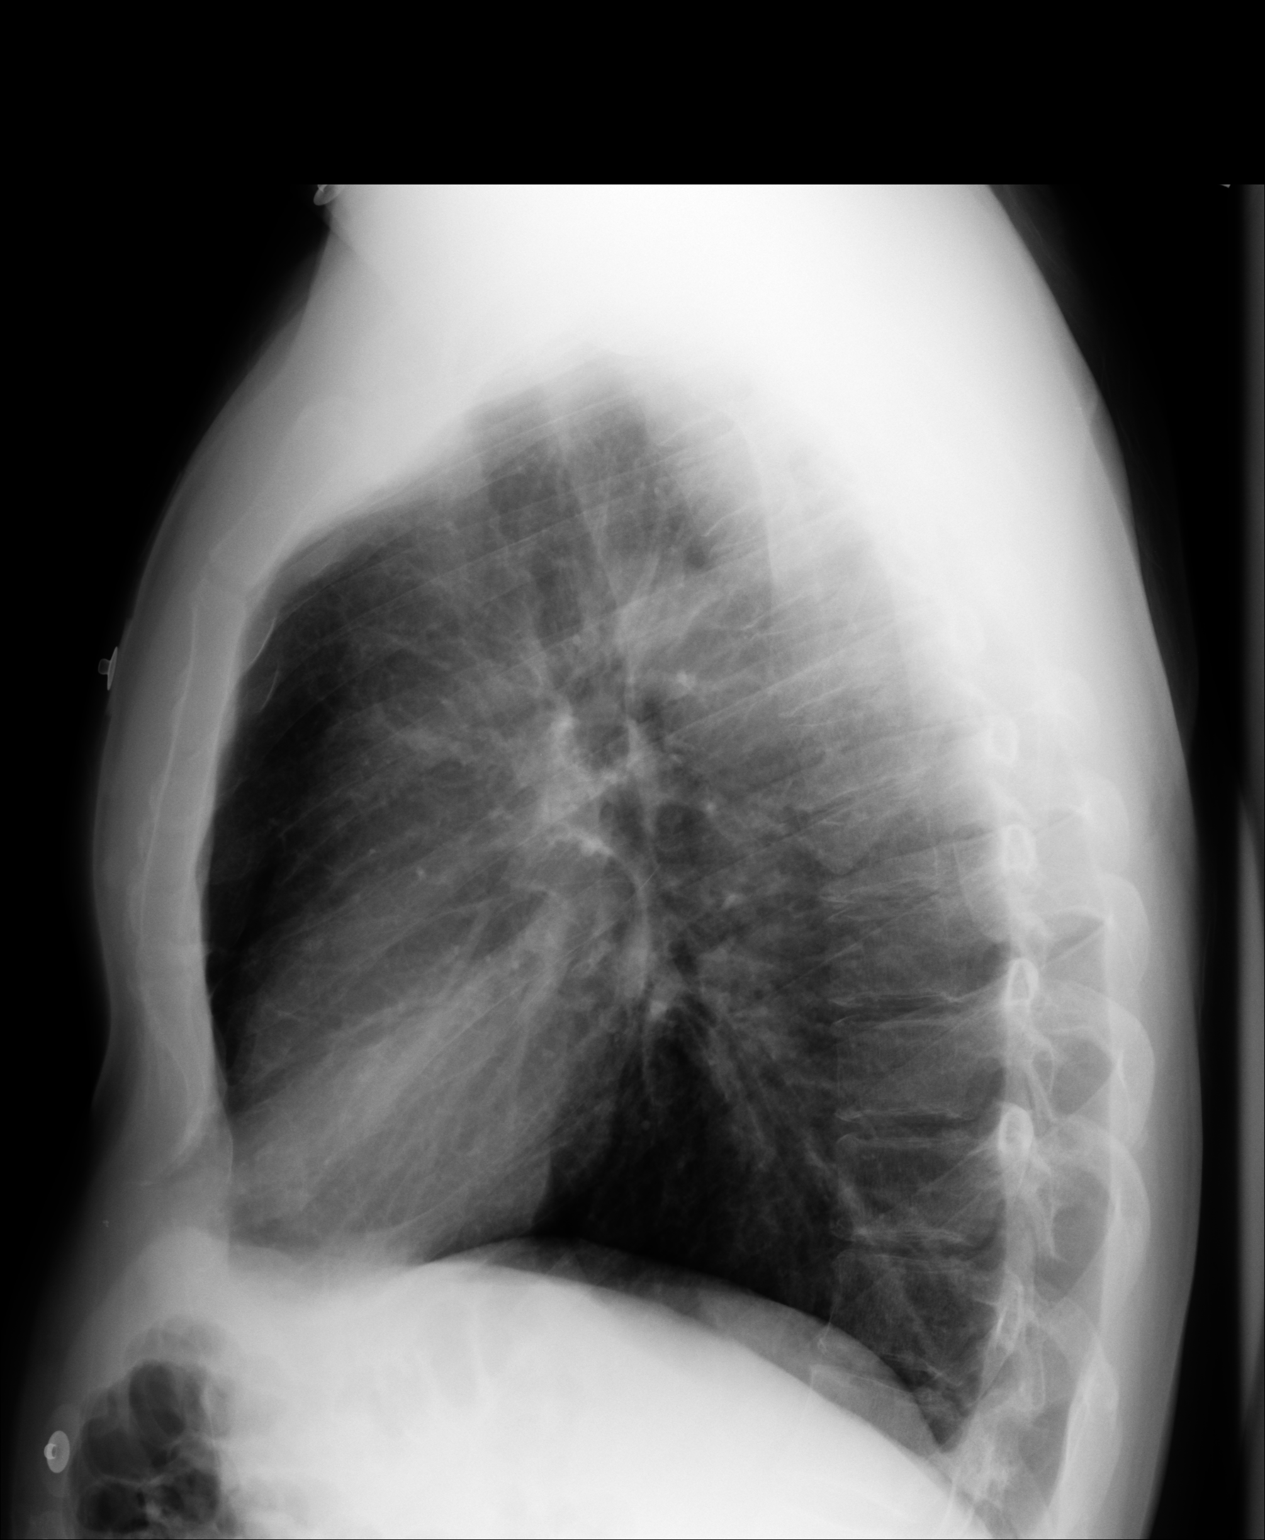

[2 of 2 positions shown; findings below may reference images not displayed]

IMPRESSION: 1. The lung fields are clear.
2. The chest is hyperexpanded compatible with a history of COPD or asthma.

## 2008-06-08 ENCOUNTER — Emergency Department: Payer: Self-pay | Admitting: Emergency Medicine

## 2008-06-08 IMAGING — CR DG CHEST 2V
1 series · 3 of 3 positions shown · non-contrast
Comparison: none

REASON FOR EXAM: Chest Pain
COMMENTS:

[Series 1: view not recorded · 0.17mm/px · 3 of 3 slices shown]
[im 1/3]
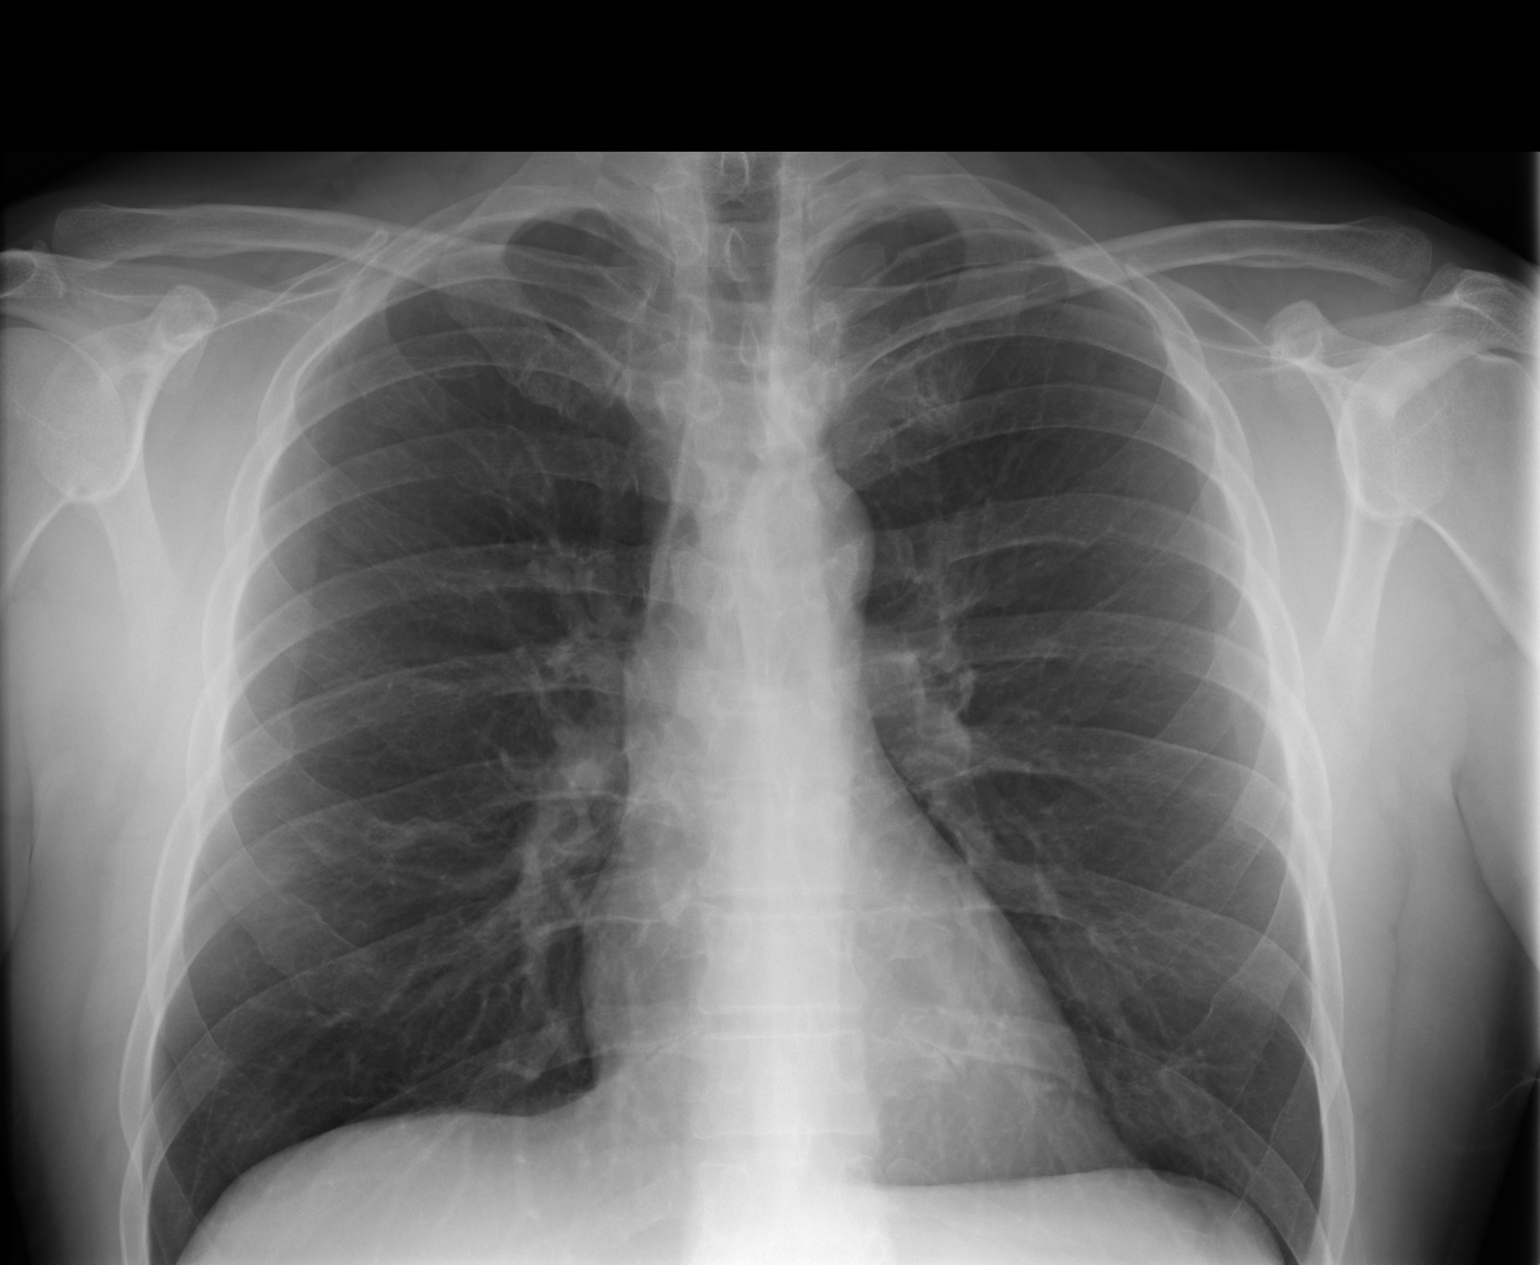
[im 2/3]
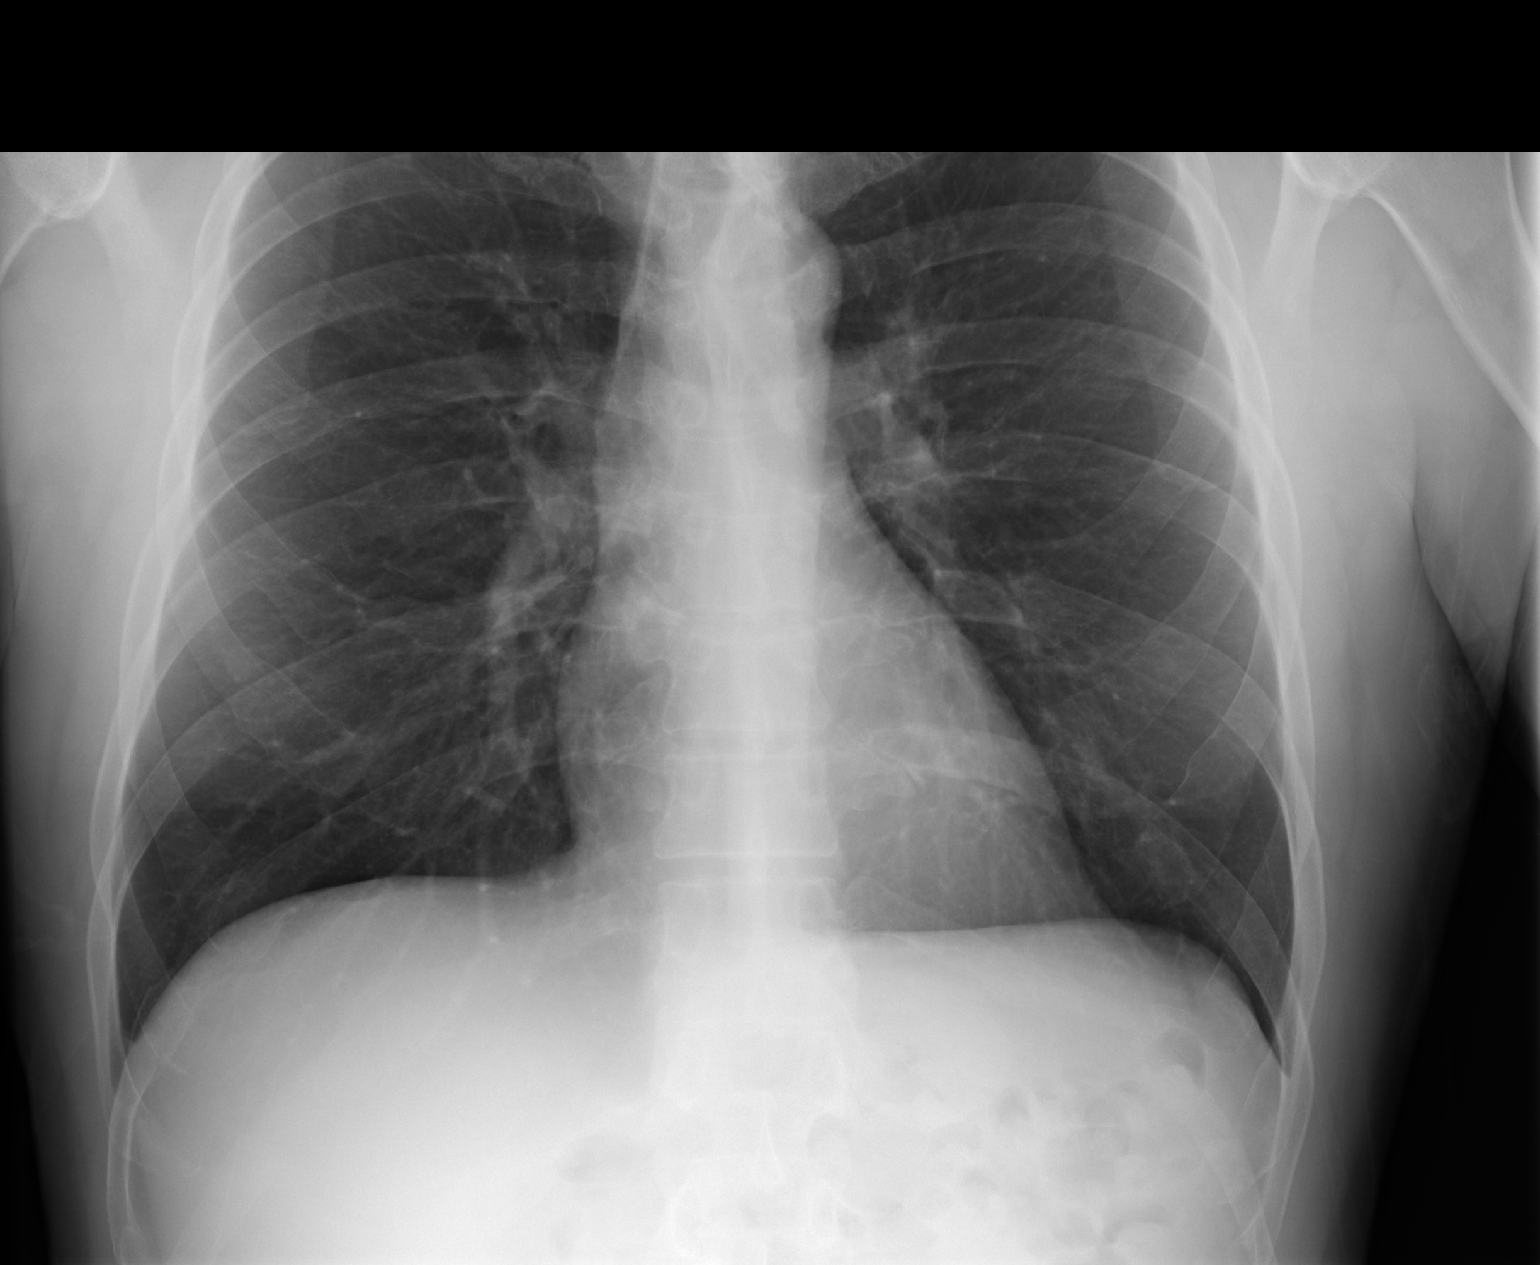
[im 3/3]
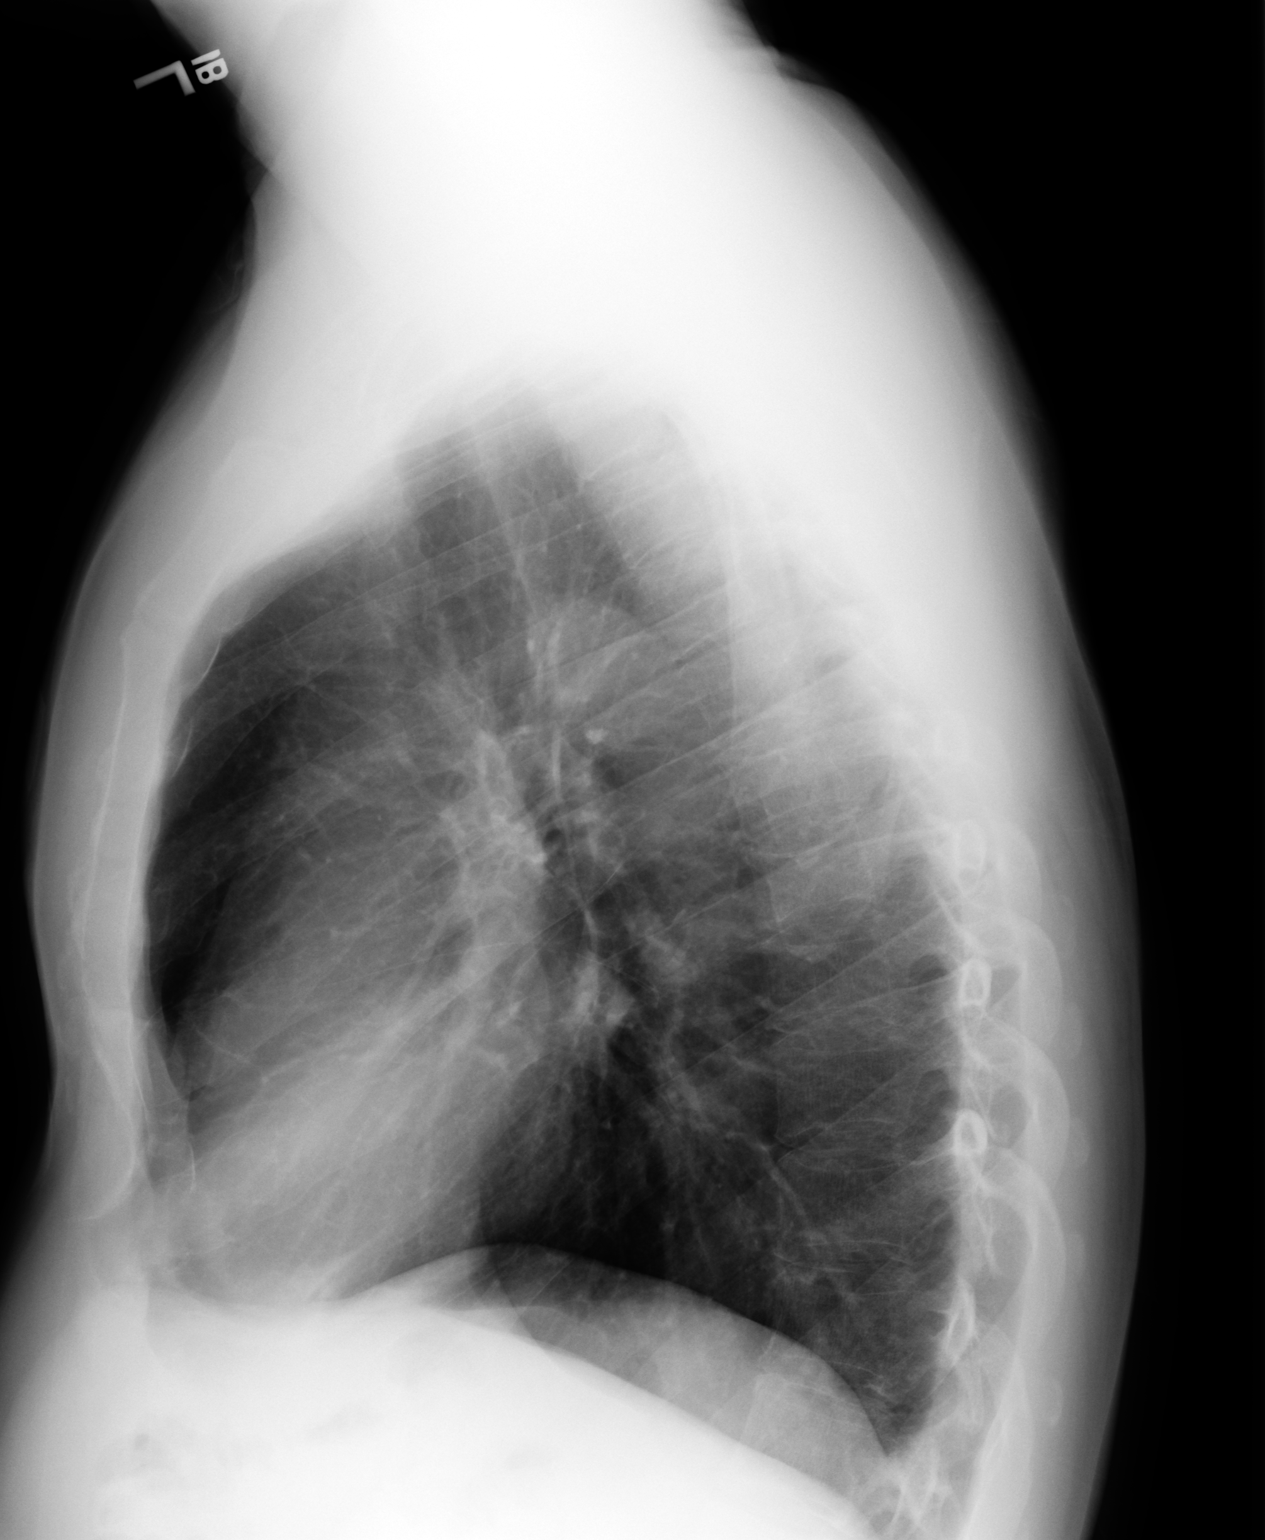

[3 of 3 positions shown; findings below may reference images not displayed]

PROCEDURE:     DXR - DXR CHEST PA (OR AP) AND LATERAL  - [DATE]  [DATE]

RESULT:     There is no previous examination for comparison. There is mild
hyperinflation. The lungs are clear. The heart and pulmonary vessels are
normal. The bony and mediastinal structures are unremarkable. There is no
effusion. There is no pneumothorax or evidence of congestive failure.
IMPRESSION: No acute cardiopulmonary disease.

## 2008-06-10 ENCOUNTER — Inpatient Hospital Stay: Payer: Self-pay | Admitting: *Deleted

## 2008-06-10 IMAGING — CR DG CHEST 1V PORT
1 series · 2 of 2 positions shown · non-contrast
Comparison: none

REASON FOR EXAM: pain
COMMENTS:

PROCEDURE:     DXR - DXR PORTABLE CHEST SINGLE VIEW  - [DATE] [DATE]
RESULT:     Comparison is made to study [DATE].
The lungs are adequately inflated. There is no focal infiltrate. The heart
is normal in size. The pulmonary vascularity does not appear engorged. I see
no pleural effusion.

[Series 1: view not recorded · 0.17mm/px · 2 of 2 slices shown]
[im 1/2]
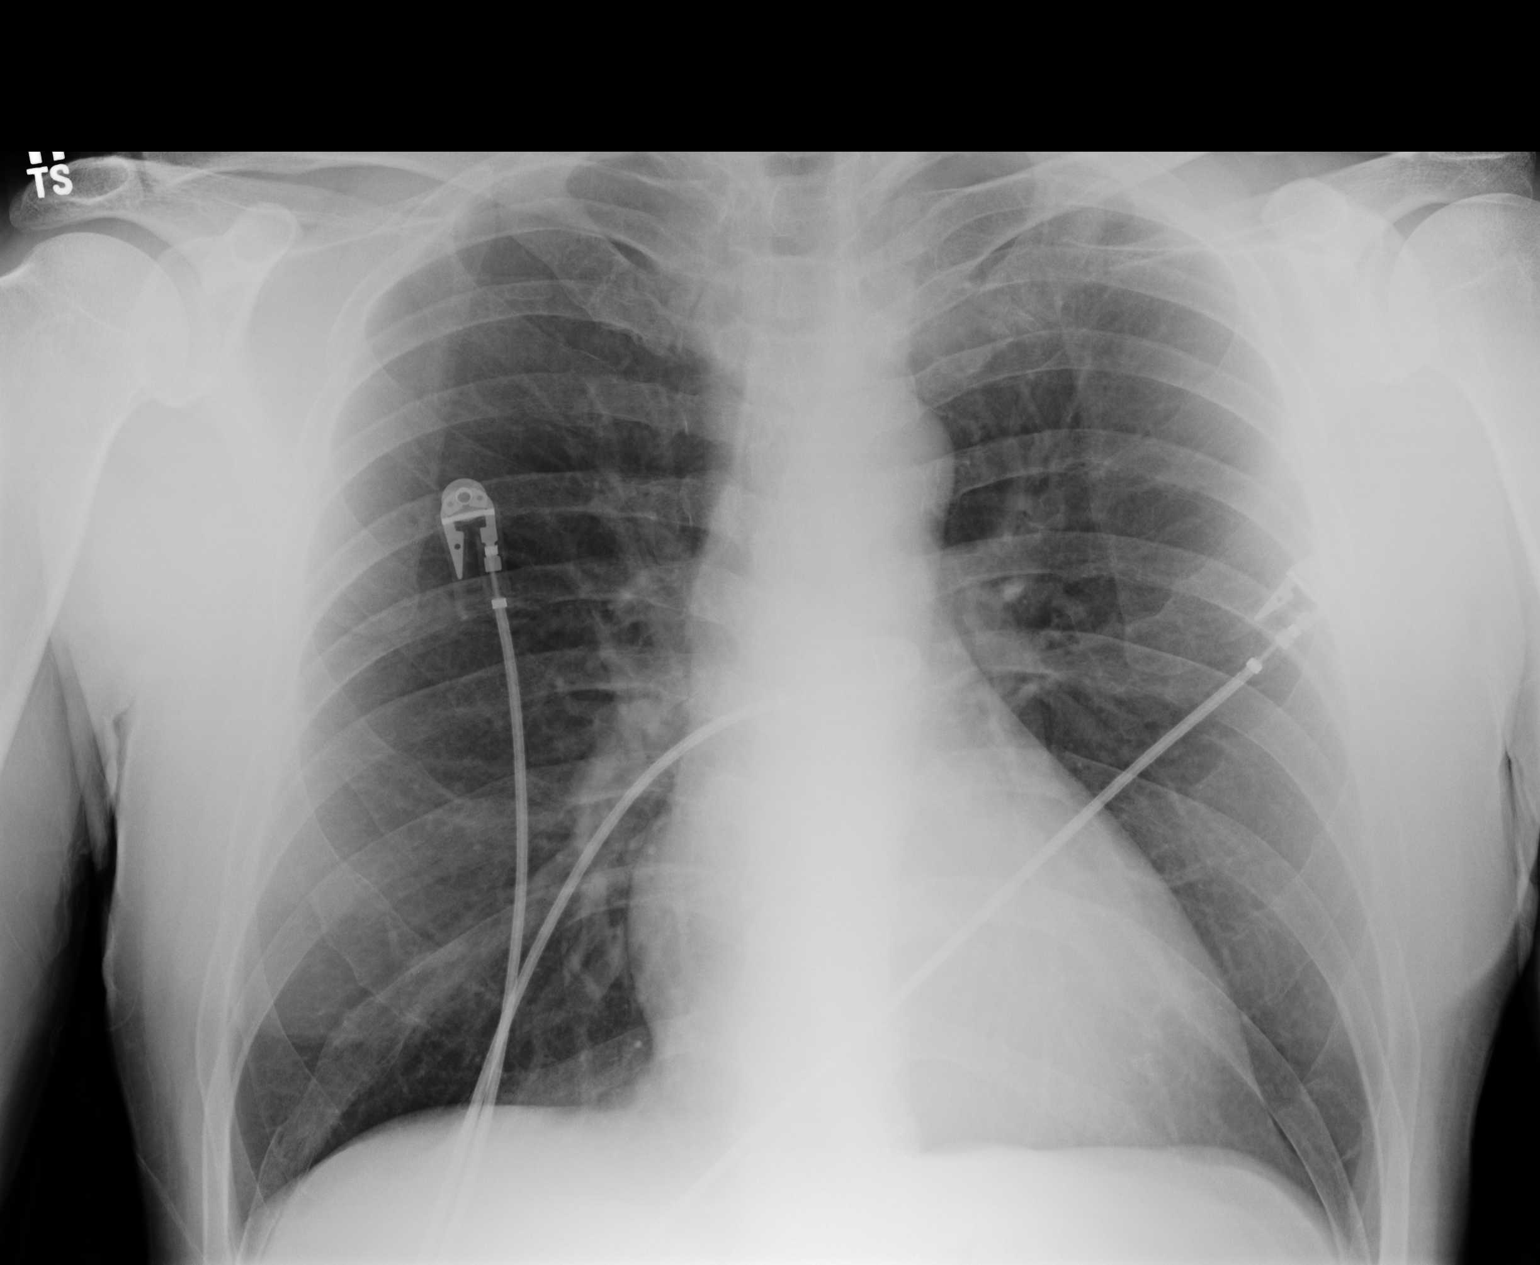
[im 2/2]
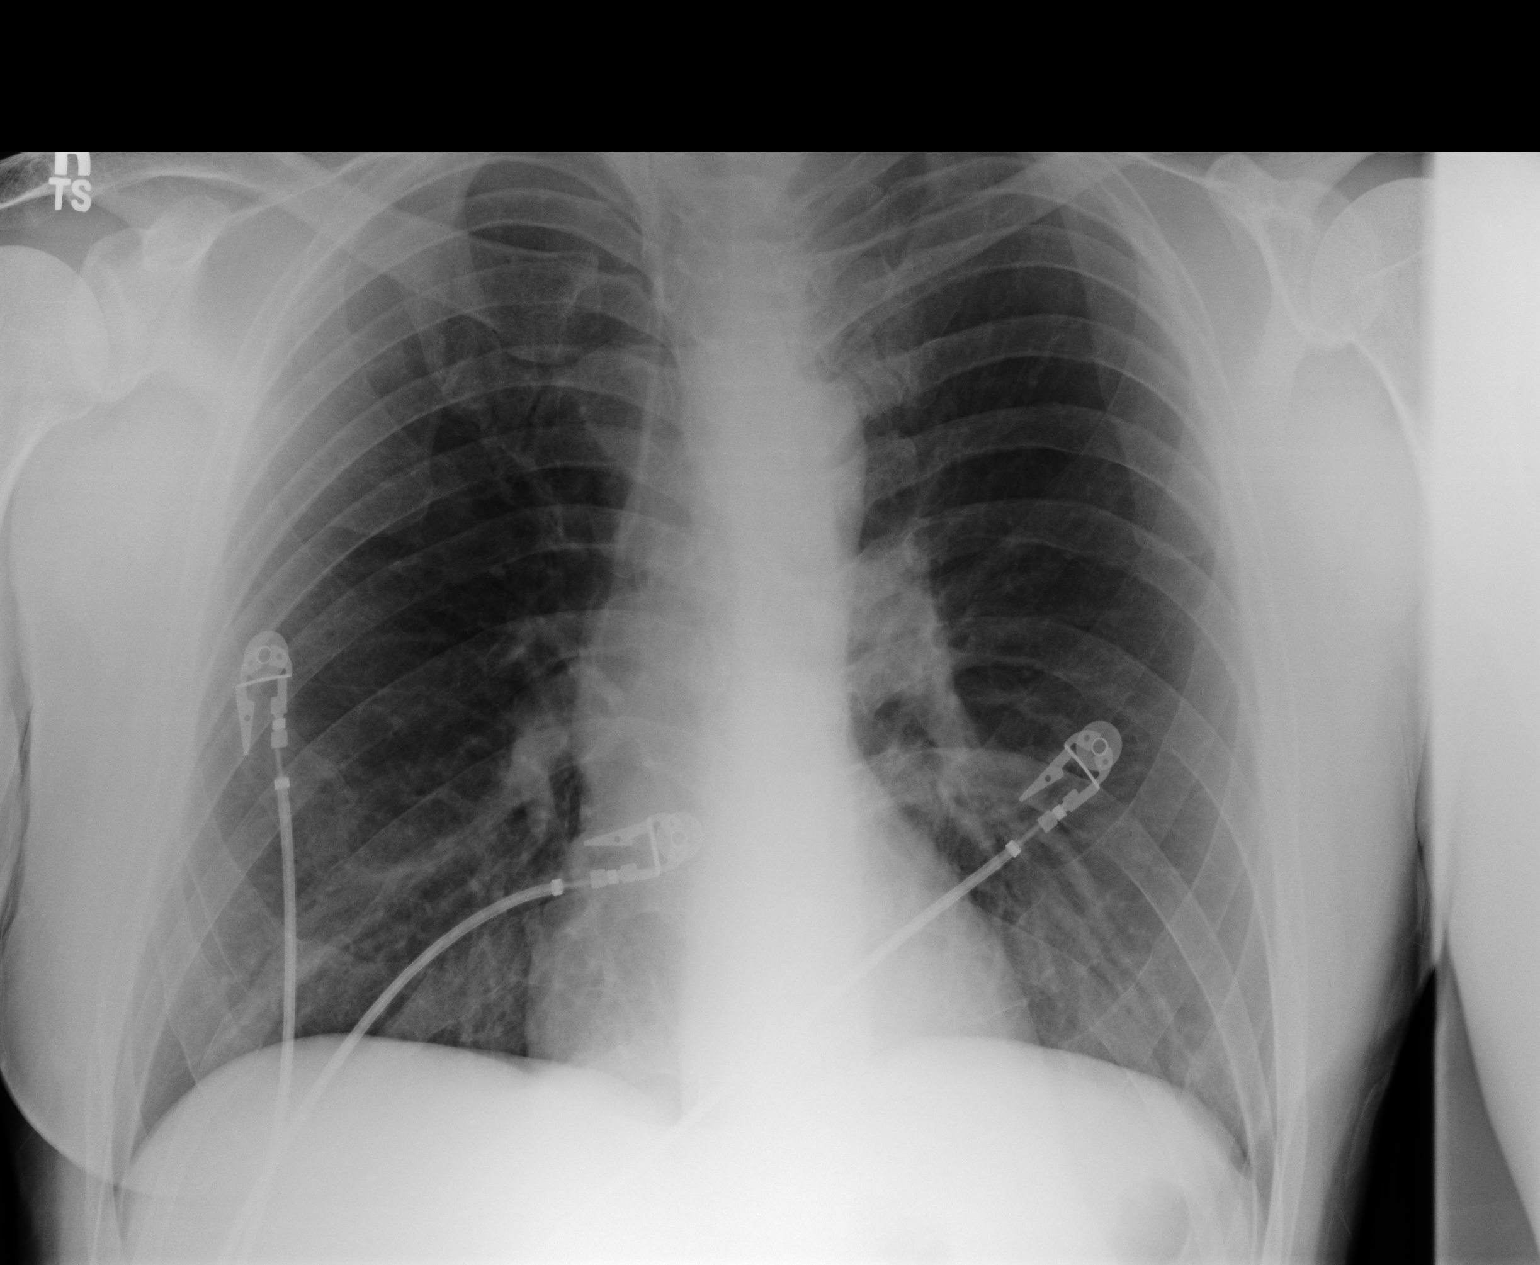

[2 of 2 positions shown; findings below may reference images not displayed]

IMPRESSION: I do not see evidence of acute cardiopulmonary disease.
Mild hyperinflation is present and stable.

## 2008-09-21 ENCOUNTER — Emergency Department: Payer: Self-pay | Admitting: Emergency Medicine

## 2008-09-21 IMAGING — CR DG CHEST 2V
1 series · 2 of 2 positions shown · non-contrast
Comparison: none

REASON FOR EXAM: Chest Pain
COMMENTS:

[Series 1: view not recorded · 0.17mm/px · 2 of 2 slices shown]
[im 1/2]
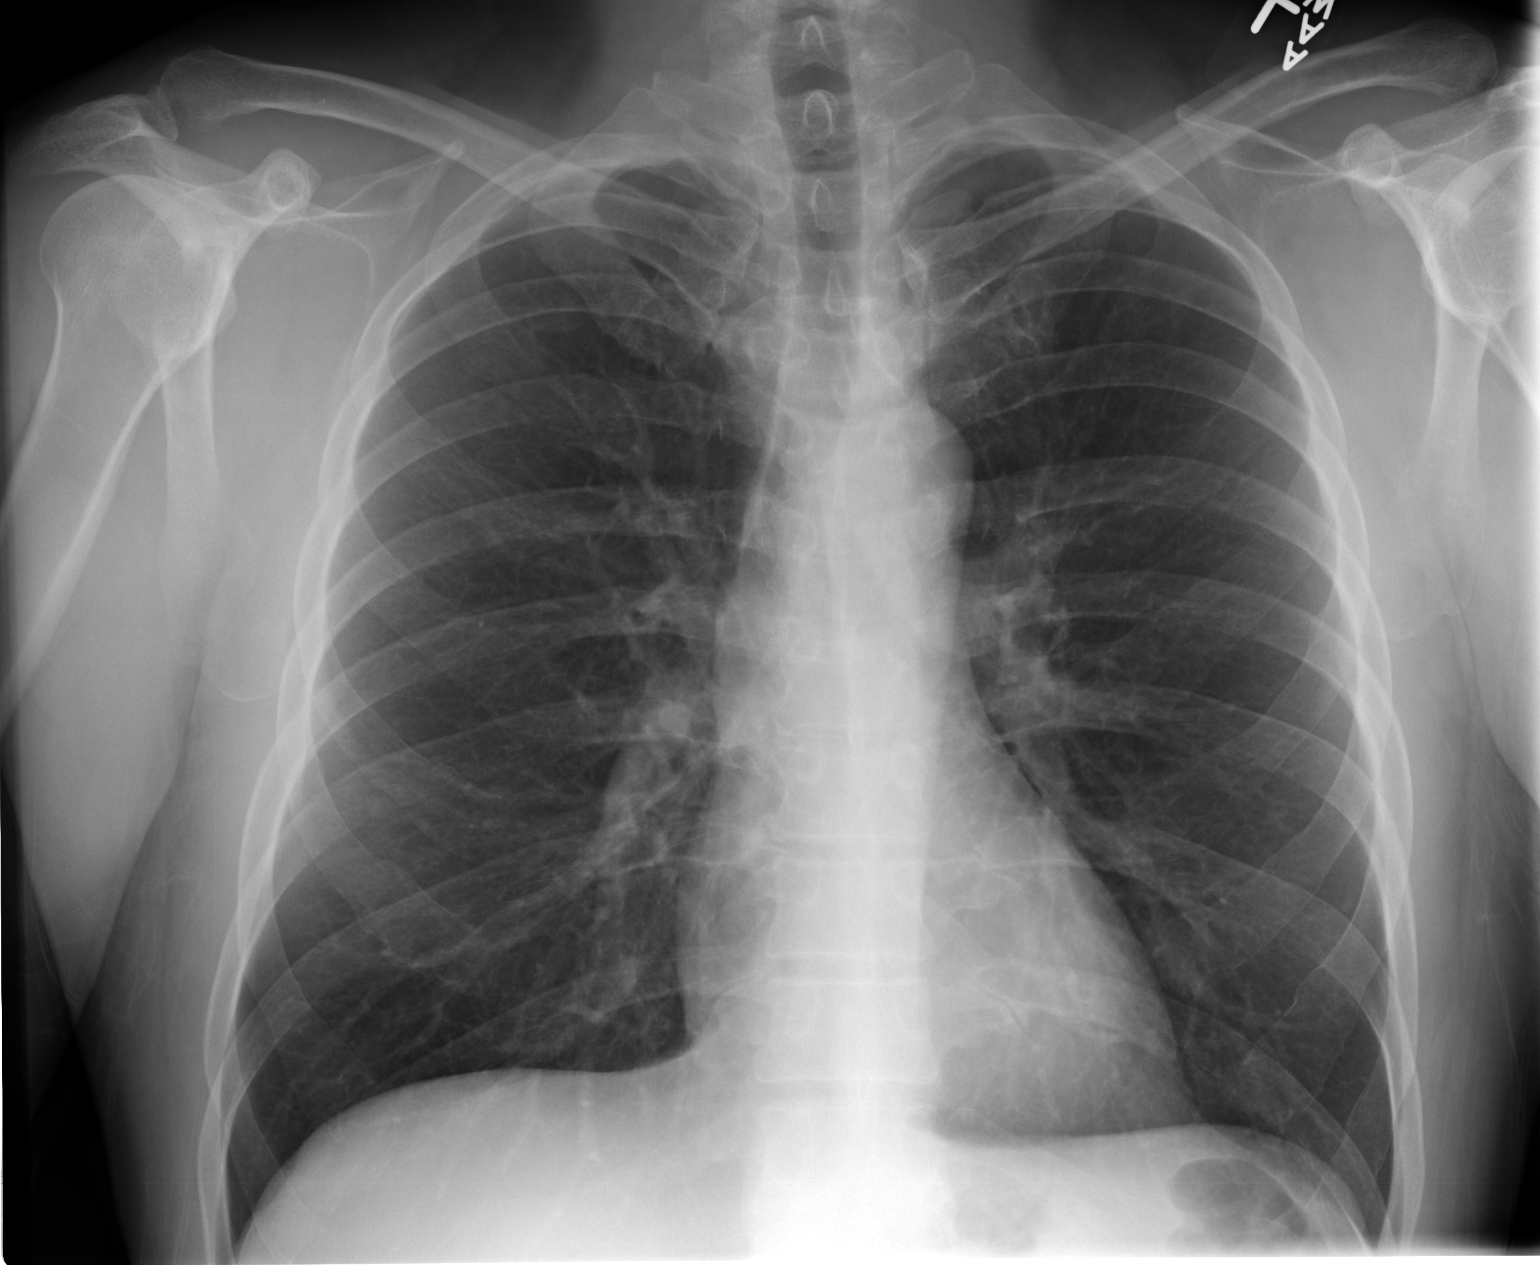
[im 2/2]
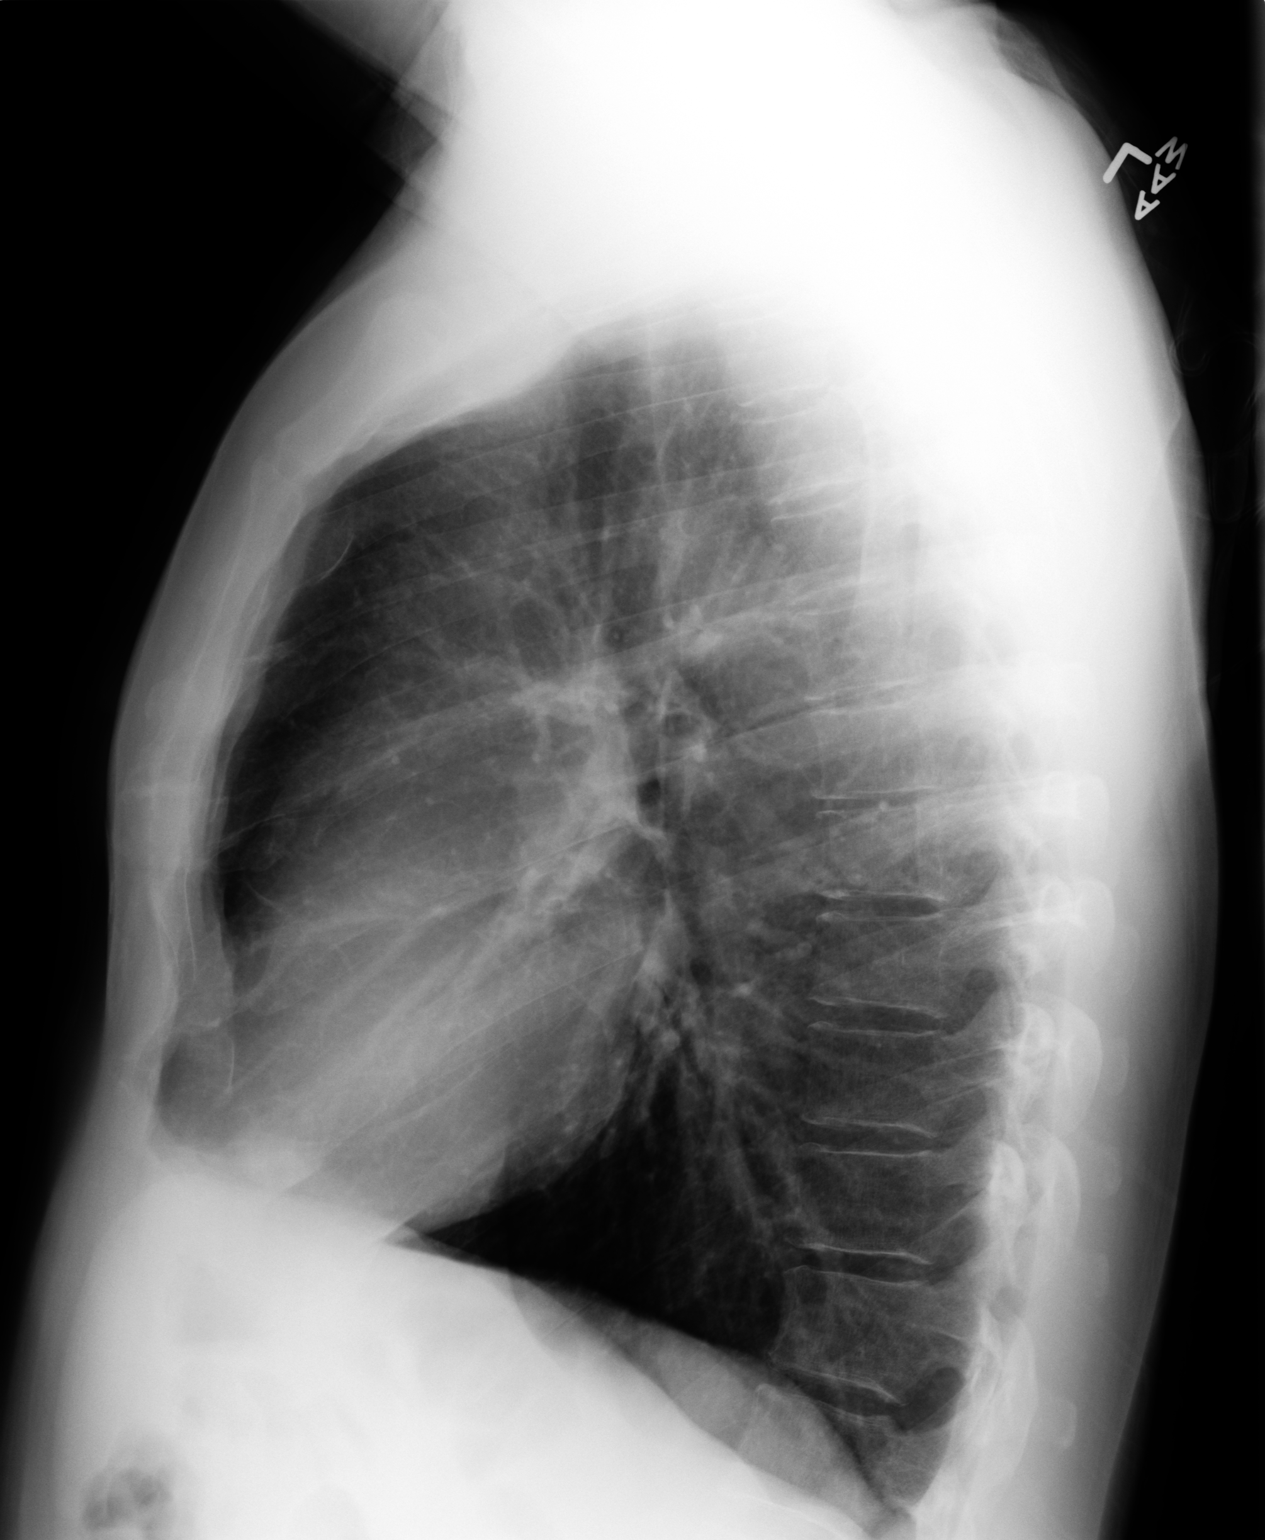

[2 of 2 positions shown; findings below may reference images not displayed]

PROCEDURE:     DXR - DXR CHEST PA (OR AP) AND LATERAL  - [DATE]  [DATE]

RESULT:     Comparison is made to the exam dated [DATE].

The lungs are hyperinflated. The lungs are clear. The heart and pulmonary
vessels are normal. The bony and mediastinal structures are unremarkable.
There is no effusion. There is no pneumothorax or evidence of congestive
failure.
IMPRESSION: No acute cardiopulmonary disease. Hyperinflation suggestive
of COPD. Reactive airway disease could give a similar appearance. Clinical
correlation is recommended.

## 2008-10-12 ENCOUNTER — Emergency Department: Payer: Self-pay | Admitting: Emergency Medicine

## 2017-04-21 LAB — PULMONARY FUNCTION TEST

## 2018-08-06 ENCOUNTER — Encounter: Payer: Self-pay | Admitting: Nurse Practitioner

## 2019-04-19 ENCOUNTER — Telehealth: Payer: Self-pay | Admitting: Family Medicine

## 2019-04-19 DIAGNOSIS — J189 Pneumonia, unspecified organism: Secondary | ICD-10-CM

## 2019-04-19 HISTORY — DX: Pneumonia, unspecified organism: J18.9

## 2019-04-19 NOTE — Telephone Encounter (Signed)
Copied from [place] [phone]. Topic: Appointment Scheduling - Scheduling Inquiry for Clinic >> Apr 19, 2019  9:16 AM Blase Mess A wrote: CRM for notification telephone encounter for: 04/19/19.  Patient is calling to schedule new patient appointment. Patient needs a medication refill. Patient moved back from [place].  And patient has medicaid. [phone] (M)

## 2019-04-28 ENCOUNTER — Encounter: Payer: Self-pay | Admitting: Nurse Practitioner

## 2019-04-28 ENCOUNTER — Ambulatory Visit (INDEPENDENT_AMBULATORY_CARE_PROVIDER_SITE_OTHER): Payer: Medicaid Other | Admitting: Nurse Practitioner

## 2019-04-28 ENCOUNTER — Other Ambulatory Visit: Payer: Self-pay

## 2019-04-28 VITALS — BP 148/88 | HR 96 | Temp 98.3°F

## 2019-04-28 DIAGNOSIS — N400 Enlarged prostate without lower urinary tract symptoms: Secondary | ICD-10-CM | POA: Insufficient documentation

## 2019-04-28 DIAGNOSIS — K219 Gastro-esophageal reflux disease without esophagitis: Secondary | ICD-10-CM | POA: Insufficient documentation

## 2019-04-28 DIAGNOSIS — L2082 Flexural eczema: Secondary | ICD-10-CM

## 2019-04-28 DIAGNOSIS — G894 Chronic pain syndrome: Secondary | ICD-10-CM

## 2019-04-28 DIAGNOSIS — K649 Unspecified hemorrhoids: Secondary | ICD-10-CM | POA: Insufficient documentation

## 2019-04-28 DIAGNOSIS — L309 Dermatitis, unspecified: Secondary | ICD-10-CM | POA: Insufficient documentation

## 2019-04-28 DIAGNOSIS — F209 Schizophrenia, unspecified: Secondary | ICD-10-CM

## 2019-04-28 DIAGNOSIS — F1021 Alcohol dependence, in remission: Secondary | ICD-10-CM | POA: Diagnosis not present

## 2019-04-28 DIAGNOSIS — K640 First degree hemorrhoids: Secondary | ICD-10-CM

## 2019-04-28 DIAGNOSIS — F4312 Post-traumatic stress disorder, chronic: Secondary | ICD-10-CM | POA: Diagnosis not present

## 2019-04-28 DIAGNOSIS — I10 Essential (primary) hypertension: Secondary | ICD-10-CM | POA: Insufficient documentation

## 2019-04-28 DIAGNOSIS — F17219 Nicotine dependence, cigarettes, with unspecified nicotine-induced disorders: Secondary | ICD-10-CM | POA: Insufficient documentation

## 2019-04-28 DIAGNOSIS — F1020 Alcohol dependence, uncomplicated: Secondary | ICD-10-CM | POA: Insufficient documentation

## 2019-04-28 DIAGNOSIS — F17200 Nicotine dependence, unspecified, uncomplicated: Secondary | ICD-10-CM | POA: Insufficient documentation

## 2019-04-28 MED ORDER — OMEPRAZOLE 40 MG PO CPDR: 40.0000 mg | DELAYED_RELEASE_CAPSULE | Freq: Every day | ORAL | 2 refills | Status: DC

## 2019-04-28 MED ORDER — TIOTROPIUM BROMIDE MONOHYDRATE 18 MCG IN CAPS: 18.0000 ug | ORAL_CAPSULE | Freq: Every day | RESPIRATORY_TRACT | 12 refills | Status: DC

## 2019-04-28 MED ORDER — OXYBUTYNIN CHLORIDE 5 MG PO TABS: 5.0000 mg | ORAL_TABLET | Freq: Two times a day (BID) | ORAL | 2 refills | Status: DC

## 2019-04-28 MED ORDER — HYDROCORTISONE (PERIANAL) 2.5 % EX CREA: 1.0000 "application " | TOPICAL_CREAM | Freq: Two times a day (BID) | CUTANEOUS | 0 refills | Status: DC

## 2019-04-28 MED ORDER — TRIAMCINOLONE ACETONIDE 0.1 % EX CREA: 1.0000 "application " | TOPICAL_CREAM | Freq: Two times a day (BID) | CUTANEOUS | 0 refills | Status: DC

## 2019-04-28 MED ORDER — CLONIDINE HCL 0.1 MG PO TABS: 0.1000 mg | ORAL_TABLET | Freq: Two times a day (BID) | ORAL | 2 refills | Status: DC

## 2019-04-28 MED ORDER — LISINOPRIL 5 MG PO TABS: 5.0000 mg | ORAL_TABLET | Freq: Every day | ORAL | 2 refills | Status: DC

## 2019-04-28 MED ORDER — BUPROPION HCL ER (XL) 150 MG PO TB24: 150.0000 mg | ORAL_TABLET | Freq: Every day | ORAL | 2 refills | Status: DC

## 2019-04-28 MED ORDER — FINASTERIDE 5 MG PO TABS: 5.0000 mg | ORAL_TABLET | Freq: Every day | ORAL | 2 refills | Status: DC

## 2019-04-28 MED ORDER — TAMSULOSIN HCL 0.4 MG PO CAPS: 0.4000 mg | ORAL_CAPSULE | Freq: Every day | ORAL | 2 refills | Status: DC

## 2019-04-28 MED ORDER — ALBUTEROL SULFATE HFA 108 (90 BASE) MCG/ACT IN AERS: 2.0000 | INHALATION_SPRAY | Freq: Four times a day (QID) | RESPIRATORY_TRACT | 3 refills | Status: DC | PRN

## 2019-04-28 NOTE — Assessment & Plan Note (Signed)
Reports history of heavy alcohol use, denies current heavy use, although also reported drinking 4-5 beers night before appointment today.  Monitor closely and obtain UDS with alc today.

## 2019-04-28 NOTE — Assessment & Plan Note (Addendum)
Chronic, ongoing with current treatment of Norco 1 tablet Q6H PRN.  Has tried multiple other pain management methods in past.  Will obtain UDS today and place pain management clinic referral, patient agrees to this plan of care and reports understanding that refill for Norco can not be provided today.  He agrees to return in one week, at which time we hope to have previous provider records.  If available and UDS shows no concerning results, will provide patient with 30 day supply of Norco only until he can be seen at pain clinic.  Return in one week.

## 2019-04-28 NOTE — Patient Instructions (Signed)

## 2019-04-28 NOTE — Assessment & Plan Note (Signed)
Chronic, ongoing.  Continue current medication regimen, consider discontinuation of Finasteride, as is on long term Flomax.  Discuss PSA lab next visit.

## 2019-04-28 NOTE — Assessment & Plan Note (Signed)
With COPD.  I have recommended complete cessation of tobacco use. I have discussed various options available for assistance with tobacco cessation including over the counter methods (Nicotine gum, patch and lozenges). We also discussed prescription options (Chantix, Nicotine Inhaler / Nasal Spray). The patient is not interested in pursuing any prescription tobacco cessation options at this time.  
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
## 2019-04-28 NOTE — Assessment & Plan Note (Signed)
Script for Triamcinolone cream sent.  Monitor for worsening.  No current flares.

## 2019-04-28 NOTE — Assessment & Plan Note (Signed)
Chronic per patient report.  Current psych medication is Wellbutrin.  Will monitor patient mood and behavior, may benefit from addition of antipsychotic at future visit.  He denies SI/HI.  Will review previous provider notes once available.

## 2019-04-28 NOTE — Assessment & Plan Note (Signed)
Chronic, ongoing.  Continue current medication regimen, Prilosec, and obtain Mag level next visit.
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
## 2019-04-28 NOTE — Assessment & Plan Note (Signed)
Anusol script sent, discuss with at next appointment patient GI referral for routine colonoscopy screening.

## 2019-04-28 NOTE — Assessment & Plan Note (Signed)
Chronic, ongoing.  Initial BP elevated and repeat improved, but continues to be above goal.  Continue current medication regimen and review past provider records once available, as patient reports allergy to a previous HTN medication although does not recall which one.  Consider increase of Lisinopril at next visit and obtain baseline labs.  Recommend cessation of smoking.

## 2019-04-30 LAB — URINE DRUGS OF ABUSE SCREEN W ALC, ROUTINE (REF LAB)
Amphetamines, Urine: NEGATIVE ng/mL
Barbiturate Quant, Ur: NEGATIVE ng/mL
Benzodiazepine Quant, Ur: NEGATIVE ng/mL
Cannabinoid Quant, Ur: NEGATIVE ng/mL
Cocaine (Metab.): NEGATIVE ng/mL
Ethanol, Urine: NEGATIVE %
Methadone Screen, Urine: NEGATIVE ng/mL
Opiate Quant, Ur: NEGATIVE ng/mL
PCP Quant, Ur: NEGATIVE ng/mL
Propoxyphene: NEGATIVE ng/mL

## 2019-05-04 ENCOUNTER — Telehealth: Payer: Self-pay | Admitting: Nurse Practitioner

## 2019-05-04 NOTE — Telephone Encounter (Signed)
Called pt to go over script screening for Covid-19 no answer left vm °

## 2019-05-05 ENCOUNTER — Encounter: Payer: Self-pay | Admitting: Nurse Practitioner

## 2019-05-05 ENCOUNTER — Ambulatory Visit (INDEPENDENT_AMBULATORY_CARE_PROVIDER_SITE_OTHER): Payer: Medicaid Other | Admitting: Nurse Practitioner

## 2019-05-05 ENCOUNTER — Other Ambulatory Visit: Payer: Self-pay

## 2019-05-05 VITALS — BP 152/86 | HR 78 | Temp 98.6°F

## 2019-05-05 DIAGNOSIS — Z79899 Other long term (current) drug therapy: Secondary | ICD-10-CM

## 2019-05-05 DIAGNOSIS — G894 Chronic pain syndrome: Secondary | ICD-10-CM | POA: Diagnosis not present

## 2019-05-05 DIAGNOSIS — Z13220 Encounter for screening for lipoid disorders: Secondary | ICD-10-CM

## 2019-05-05 DIAGNOSIS — Z114 Encounter for screening for human immunodeficiency virus [HIV]: Secondary | ICD-10-CM

## 2019-05-05 DIAGNOSIS — Z1329 Encounter for screening for other suspected endocrine disorder: Secondary | ICD-10-CM | POA: Diagnosis not present

## 2019-05-05 DIAGNOSIS — I10 Essential (primary) hypertension: Secondary | ICD-10-CM | POA: Diagnosis not present

## 2019-05-05 DIAGNOSIS — Z1159 Encounter for screening for other viral diseases: Secondary | ICD-10-CM

## 2019-05-05 MED ORDER — HYDROCODONE-ACETAMINOPHEN 5-325 MG PO TABS: 1.0000 | ORAL_TABLET | Freq: Four times a day (QID) | ORAL | 0 refills | Status: DC | PRN

## 2019-05-05 MED ORDER — LISINOPRIL 10 MG PO TABS: 10.0000 mg | ORAL_TABLET | Freq: Every day | ORAL | 3 refills | Status: DC

## 2019-05-05 NOTE — Patient Instructions (Signed)
DASH Eating Plan  DASH stands for "Dietary Approaches to Stop Hypertension." The DASH eating plan is a healthy eating plan that has been shown to reduce high blood pressure (hypertension). It may also reduce your risk for type 2 diabetes, heart disease, and stroke. The DASH eating plan may also help with weight loss.  What are tips for following this plan?    General guidelines   Avoid eating more than 2,300 mg (milligrams) of salt (sodium) a day. If you have hypertension, you may need to reduce your sodium intake to 1,500 mg a day.   Limit alcohol intake to no more than 1 drink a day for nonpregnant women and 2 drinks a day for men. One drink equals 12 oz of beer, 5 oz of wine, or 1 oz of hard liquor.   Work with your health care provider to maintain a healthy body weight or to lose weight. Ask what an ideal weight is for you.   Get at least 30 minutes of exercise that causes your heart to beat faster (aerobic exercise) most days of the week. Activities may include walking, swimming, or biking.   Work with your health care provider or diet and nutrition specialist (dietitian) to adjust your eating plan to your individual calorie needs.  Reading food labels     Check food labels for the amount of sodium per serving. Choose foods with less than 5 percent of the Daily Value of sodium. Generally, foods with less than 300 mg of sodium per serving fit into this eating plan.   To find whole grains, look for the word "whole" as the first word in the ingredient list.  Shopping   Buy products labeled as "low-sodium" or "no salt added."   Buy fresh foods. Avoid canned foods and premade or frozen meals.  Cooking   Avoid adding salt when cooking. Use salt-free seasonings or herbs instead of table salt or sea salt. Check with your health care provider or pharmacist before using salt substitutes.   Do not fry foods. Cook foods using healthy methods such as baking, boiling, grilling, and broiling instead.   Cook with  heart-healthy oils, such as olive, canola, soybean, or sunflower oil.  Meal planning   Eat a balanced diet that includes:  ? 5 or more servings of fruits and vegetables each day. At each meal, try to fill half of your plate with fruits and vegetables.  ? Up to 6-8 servings of whole grains each day.  ? Less than 6 oz of lean meat, poultry, or fish each day. A 3-oz serving of meat is about the same size as a deck of cards. One egg equals 1 oz.  ? 2 servings of low-fat dairy each day.  ? A serving of nuts, seeds, or beans 5 times each week.  ? Heart-healthy fats. Healthy fats called Omega-3 fatty acids are found in foods such as flaxseeds and coldwater fish, like sardines, salmon, and mackerel.   Limit how much you eat of the following:  ? Canned or prepackaged foods.  ? Food that is high in trans fat, such as fried foods.  ? Food that is high in saturated fat, such as fatty meat.  ? Sweets, desserts, sugary drinks, and other foods with added sugar.  ? Full-fat dairy products.   Do not salt foods before eating.   Try to eat at least 2 vegetarian meals each week.   Eat more home-cooked food and less restaurant, buffet, and fast food.     When eating at a restaurant, ask that your food be prepared with less salt or no salt, if possible.  What foods are recommended?  The items listed may not be a complete list. Talk with your dietitian about what dietary choices are best for you.  Grains  Whole-grain or whole-wheat bread. Whole-grain or whole-wheat pasta. Brown rice. Oatmeal. Quinoa. Bulgur. Whole-grain and low-sodium cereals. Pita bread. Low-fat, low-sodium crackers. Whole-wheat flour tortillas.  Vegetables  Fresh or frozen vegetables (raw, steamed, roasted, or grilled). Low-sodium or reduced-sodium tomato and vegetable juice. Low-sodium or reduced-sodium tomato sauce and tomato paste. Low-sodium or reduced-sodium canned vegetables.  Fruits  All fresh, dried, or frozen fruit. Canned fruit in natural juice (without  added sugar).  Meat and other protein foods  Skinless chicken or turkey. Ground chicken or turkey. Pork with fat trimmed off. Fish and seafood. Egg whites. Dried beans, peas, or lentils. Unsalted nuts, nut butters, and seeds. Unsalted canned beans. Lean cuts of beef with fat trimmed off. Low-sodium, lean deli meat.  Dairy  Low-fat (1%) or fat-free (skim) milk. Fat-free, low-fat, or reduced-fat cheeses. Nonfat, low-sodium ricotta or cottage cheese. Low-fat or nonfat yogurt. Low-fat, low-sodium cheese.  Fats and oils  Soft margarine without trans fats. Vegetable oil. Low-fat, reduced-fat, or light mayonnaise and salad dressings (reduced-sodium). Canola, safflower, olive, soybean, and sunflower oils. Avocado.  Seasoning and other foods  Herbs. Spices. Seasoning mixes without salt. Unsalted popcorn and pretzels. Fat-free sweets.  What foods are not recommended?  The items listed may not be a complete list. Talk with your dietitian about what dietary choices are best for you.  Grains  Baked goods made with fat, such as croissants, muffins, or some breads. Dry pasta or rice meal packs.  Vegetables  Creamed or fried vegetables. Vegetables in a cheese sauce. Regular canned vegetables (not low-sodium or reduced-sodium). Regular canned tomato sauce and paste (not low-sodium or reduced-sodium). Regular tomato and vegetable juice (not low-sodium or reduced-sodium). Pickles. Olives.  Fruits  Canned fruit in a light or heavy syrup. Fried fruit. Fruit in cream or butter sauce.  Meat and other protein foods  Fatty cuts of meat. Ribs. Fried meat. Bacon. Sausage. Bologna and other processed lunch meats. Salami. Fatback. Hotdogs. Bratwurst. Salted nuts and seeds. Canned beans with added salt. Canned or smoked fish. Whole eggs or egg yolks. Chicken or turkey with skin.  Dairy  Whole or 2% milk, cream, and half-and-half. Whole or full-fat cream cheese. Whole-fat or sweetened yogurt. Full-fat cheese. Nondairy creamers. Whipped toppings.  Processed cheese and cheese spreads.  Fats and oils  Butter. Stick margarine. Lard. Shortening. Ghee. Bacon fat. Tropical oils, such as coconut, palm kernel, or palm oil.  Seasoning and other foods  Salted popcorn and pretzels. Onion salt, garlic salt, seasoned salt, table salt, and sea salt. Worcestershire sauce. Tartar sauce. Barbecue sauce. Teriyaki sauce. Soy sauce, including reduced-sodium. Steak sauce. Canned and packaged gravies. Fish sauce. Oyster sauce. Cocktail sauce. Horseradish that you find on the shelf. Ketchup. Mustard. Meat flavorings and tenderizers. Bouillon cubes. Hot sauce and Tabasco sauce. Premade or packaged marinades. Premade or packaged taco seasonings. Relishes. Regular salad dressings.  Where to find more information:   National Heart, Lung, and Blood Institute: www.nhlbi.nih.gov   American Heart Association: www.heart.org  Summary   The DASH eating plan is a healthy eating plan that has been shown to reduce high blood pressure (hypertension). It may also reduce your risk for type 2 diabetes, heart disease, and stroke.   With the   DASH eating plan, you should limit salt (sodium) intake to 2,300 mg a day. If you have hypertension, you may need to reduce your sodium intake to 1,500 mg a day.   When on the DASH eating plan, aim to eat more fresh fruits and vegetables, whole grains, lean proteins, low-fat dairy, and heart-healthy fats.   Work with your health care provider or diet and nutrition specialist (dietitian) to adjust your eating plan to your individual calorie needs.  This information is not intended to replace advice given to you by your health care provider. Make sure you discuss any questions you have with your health care provider.  Document Released: 10/24/2011 Document Revised: 10/28/2016 Document Reviewed: 10/28/2016  Elsevier Interactive Patient Education  2019 [employer]

## 2019-05-05 NOTE — Progress Notes (Signed)
BP (!) 152/86 (BP Location: Left Arm, Patient Position: Sitting)   Pulse 78   Temp 98.6 F (37 C) (Oral)   SpO2 99%    Subjective:    Patient ID: Luke Pollen., Briggs    DOB: 09/15/1961, 58 y.o.   MRN: [id number]  HPI: Luke Mcnab. is a 58 y.o. Briggs  Chief Complaint  Patient presents with  . Follow-up    1 week   CHRONIC PAIN FOLLOW-UP: On Norco for two years, currently takes this four times a day.  Arthritis in neck, knees, hands, lower back.  Reports he has deterioration of discs in neck per his report.  Prior pain management went to [facility], Dr. Christen Bame.  Reviewed database and noted most recent refill was 03/03/2019 in [place] for 120 tablets.  This clinic is located in [place], [place].  Urine drug screen negative on 04/28/2019, had been without pain meds for 3 weeks.  He is very accepting of UDS and monitoring as did this with previous doctor.  Reports pain at baseline is 10/10 at worst and 7/10 at best to knees, neck, hands, neck.  Has had injections in both knees, steroid which helped for 1-2 weeks.  No history epidural injections.  Took Gabapentin at one time, but no longer takes as it made sick to stomach.  States he is not a drug addict and reports willingness to comply by rules of any pain clinic he attends.  States he has tried all things for pain and Norco works best.    States he has a history of drinking heavily, but reports now he does not. Reports he drinks only occasionally now, 1-2 times a week.  Says "I am not young and dumb anymore".   Denies illegal drug use.  We discussed that would provide 30 days medication today until can get into pain clinic.  Referral authorized on review and awaiting appointment.  HYPERTENSION Has been on Lisinopril for a long while.  Hypertension status: stable  Satisfied with current treatment? yes Duration of hypertension: chronic BP monitoring frequency:  not checking BP range: not checking BP  medication side effects:  no Medication compliance: good compliance Aspirin: no Recurrent headaches: no Visual changes: no Palpitations: no Dyspnea: no Chest pain: no Lower extremity edema: no Dizzy/lightheaded: no   Relevant past medical, surgical, family and social history reviewed and updated as indicated. Interim medical history since our last visit reviewed. Allergies and medications reviewed and updated.  Review of Systems  Constitutional: Negative for activity change, diaphoresis, fatigue and fever.  Respiratory: Negative for cough, chest tightness, shortness of breath and wheezing.   Cardiovascular: Negative for chest pain, palpitations and leg swelling.  Gastrointestinal: Negative for abdominal distention, abdominal pain, constipation, diarrhea, nausea and vomiting.  Endocrine: Negative for cold intolerance, heat intolerance, polydipsia, polyphagia and polyuria.  Musculoskeletal: Positive for arthralgias and neck pain.  Skin: Negative.   Neurological: Negative for dizziness, syncope, weakness, light-headedness, numbness and headaches.  Psychiatric/Behavioral: Negative.     Per HPI unless specifically indicated above     Objective:    BP (!) 152/86 (BP Location: Left Arm, Patient Position: Sitting)   Pulse 78   Temp 98.6 F (37 C) (Oral)   SpO2 99%   Wt Readings from Last 3 Encounters:  No data found for Wt    Physical Exam Vitals signs and nursing note reviewed.  Constitutional:      General: He is awake. He is not in  acute distress.    Appearance: He is well-developed. He is not ill-appearing.  HENT:     Head: Normocephalic and atraumatic.     Right Ear: Hearing normal. No drainage.     Left Ear: Hearing normal. No drainage.     Mouth/Throat:     Pharynx: Uvula midline.  Eyes:     General: Lids are normal.        Right eye: No discharge.        Left eye: No discharge.     Conjunctiva/sclera: Conjunctivae normal.     Pupils: Pupils are equal, round, and  reactive to light.  Neck:     Musculoskeletal: Normal range of motion and neck supple.     Thyroid: No thyromegaly.     Vascular: No carotid bruit or JVD.     Trachea: Trachea normal.  Cardiovascular:     Rate and Rhythm: Normal rate and regular rhythm.     Heart sounds: Normal heart sounds, S1 normal and S2 normal. No murmur. No gallop.   Pulmonary:     Effort: Pulmonary effort is normal.     Breath sounds: Normal breath sounds.  Abdominal:     General: Bowel sounds are normal.     Palpations: Abdomen is soft. There is no hepatomegaly or splenomegaly.  Musculoskeletal: Normal range of motion.     Right lower leg: No edema.     Left lower leg: No edema.  Skin:    General: Skin is warm and dry.     Capillary Refill: Capillary refill takes less than 2 seconds.     Findings: No rash.  Neurological:     Mental Status: He is alert and oriented to person, place, and time.     Deep Tendon Reflexes: Reflexes are normal and symmetric.  Psychiatric:        Mood and Affect: Mood normal.        Behavior: Behavior normal. Behavior is cooperative.        Thought Content: Thought content normal.        Judgment: Judgment normal.     Results for orders placed or performed in visit on 04/28/19  Urine drugs of abuse scrn w alc, routine (Ref Lab)  Result Value Ref Range   Amphetamines, Urine Negative Cutoff=1000 ng/mL   Barbiturate Quant, Ur Negative Cutoff=300 ng/mL   Benzodiazepine Quant, Ur Negative Cutoff=300 ng/mL   Cannabinoid Quant, Ur Negative Cutoff=50 ng/mL   Cocaine (Metab.) Negative Cutoff=300 ng/mL   Opiate Quant, Ur Negative Cutoff=300 ng/mL   PCP Quant, Ur Negative Cutoff=25 ng/mL   Methadone Screen, Urine Negative Cutoff=300 ng/mL   Propoxyphene Negative Cutoff=300 ng/mL   Ethanol, Urine Negative Cutoff=0.020 %      Assessment & Plan:   Problem List Items Addressed This Visit      Cardiovascular and Mediastinum   Essential hypertension - Primary    Chronic, ongoing.   Initial BP elevated and repeat remains above goal.  Will increase Lisinopril to 10 MG daily.  Labs ordered.  Recommend monitoring BP at home and smoking cessation. Return in 4 weeks.      Relevant Medications   lisinopril (ZESTRIL) 10 MG tablet   Other Relevant Orders   CBC with Differential/Platelet   Comprehensive metabolic panel     Other   Chronic pain syndrome    Chronic, ongoing.  UDS negative.  Referral to pain clinic in place and authorized.  Controlled substance contact signed.  Will provide one month supply only  of Norco to assist until seen by pain clinic.  He is aware of risks of opioid medications, had at length discussion.  Return in 4 weeks.      Relevant Medications   HYDROcodone-acetaminophen (NORCO/VICODIN) 5-325 MG tablet   Controlled substance agreement signed    Agreement signed on visit today 05/05/2019.       Other Visit Diagnoses    Thyroid disorder screen       Relevant Orders   TSH   Screening cholesterol level       Relevant Orders   Lipid Panel w/o Chol/HDL Ratio   Encounter for screening for HIV       Relevant Orders   HIV Antibody (routine testing w rflx)   Need for hepatitis C screening test       Relevant Orders   Hepatitis C antibody      Controlled substance.  Checked data base and no other refills of controlled substances noted, last 03/03/2019.  Follow up plan: Return in about 4 weeks (around 06/02/2019) for HTN and pain.

## 2019-05-05 NOTE — Assessment & Plan Note (Signed)
Agreement signed on visit today 05/05/2019.

## 2019-05-05 NOTE — Assessment & Plan Note (Signed)
Chronic, ongoing.  UDS negative.  Referral to pain clinic in place and authorized.  Controlled substance contact signed.  Will provide one month supply only of Norco to assist until seen by pain clinic.  He is aware of risks of opioid medications, had at length discussion.  Return in 4 weeks.

## 2019-05-05 NOTE — Assessment & Plan Note (Signed)
Chronic, ongoing.  Initial BP elevated and repeat remains above goal.  Will increase Lisinopril to 10 MG daily.  Labs ordered.  Recommend monitoring BP at home and smoking cessation. Return in 4 weeks.

## 2019-05-06 ENCOUNTER — Telehealth: Payer: Self-pay | Admitting: Nurse Practitioner

## 2019-05-06 LAB — CBC WITH DIFFERENTIAL/PLATELET
Basophils Absolute: 0.1 10*3/uL (ref 0.0–0.2)
Basos: 1 %
EOS (ABSOLUTE): 0.1 10*3/uL (ref 0.0–0.4)
Eos: 1 %
Hematocrit: 46.1 % (ref 37.5–51.0)
Hemoglobin: 16 g/dL (ref 13.0–17.7)
Immature Grans (Abs): 0 10*3/uL (ref 0.0–0.1)
Immature Granulocytes: 0 %
Lymphocytes Absolute: 2.7 10*3/uL (ref 0.7–3.1)
Lymphs: 31 %
MCH: 30.8 pg (ref 26.6–33.0)
MCHC: 34.7 g/dL (ref 31.5–35.7)
MCV: 89 fL (ref 79–97)
Monocytes Absolute: 0.9 10*3/uL (ref 0.1–0.9)
Monocytes: 10 %
Neutrophils Absolute: 4.9 10*3/uL (ref 1.4–7.0)
Neutrophils: 57 %
Platelets: 296 10*3/uL (ref 150–450)
RBC: 5.2 x10E6/uL (ref 4.14–5.80)
RDW: 12.3 % (ref 11.6–15.4)
WBC: 8.6 10*3/uL (ref 3.4–10.8)

## 2019-05-06 LAB — COMPREHENSIVE METABOLIC PANEL
ALT: 25 IU/L (ref 0–44)
AST: 27 IU/L (ref 0–40)
Albumin/Globulin Ratio: 1.4 (ref 1.2–2.2)
Albumin: 3.8 g/dL (ref 3.8–4.9)
Alkaline Phosphatase: 82 IU/L (ref 39–117)
BUN/Creatinine Ratio: 9 (ref 9–20)
BUN: 8 mg/dL (ref 6–24)
Bilirubin Total: 0.4 mg/dL (ref 0.0–1.2)
CO2: 24 mmol/L (ref 20–29)
Calcium: 8.9 mg/dL (ref 8.7–10.2)
Chloride: 105 mmol/L (ref 96–106)
Creatinine, Ser: 0.92 mg/dL (ref 0.76–1.27)
GFR calc Af Amer: 108 mL/min/{1.73_m2} (ref >59)
GFR calc non Af Amer: 93 mL/min/{1.73_m2} (ref >59)
Globulin, Total: 2.8 g/dL (ref 1.5–4.5)
Glucose: 73 mg/dL (ref 65–99)
Potassium: 3.9 mmol/L (ref 3.5–5.2)
Sodium: 141 mmol/L (ref 134–144)
Total Protein: 6.6 g/dL (ref 6.0–8.5)

## 2019-05-06 LAB — LIPID PANEL W/O CHOL/HDL RATIO
Cholesterol, Total: 166 mg/dL (ref 100–199)
HDL: 56 mg/dL (ref >39)
LDL Calculated: 86 mg/dL (ref 0–99)
Triglycerides: 121 mg/dL (ref 0–149)
VLDL Cholesterol Cal: 24 mg/dL (ref 5–40)

## 2019-05-06 LAB — TSH: TSH: 1.09 u[IU]/mL (ref 0.450–4.500)

## 2019-05-06 LAB — HIV ANTIBODY (ROUTINE TESTING W REFLEX): HIV Screen 4th Generation wRfx: NONREACTIVE

## 2019-05-06 LAB — HEPATITIS C ANTIBODY: Hep C Virus Ab: <0.1 s/co ratio (ref 0.0–0.9)

## 2019-05-06 NOTE — Telephone Encounter (Signed)
Have addressed this in another communication with patient.  Will have him come in one week for follow-up and placed one month refill to last until pain clinic appointment.  Due to STOP ACT one week only allowed for first fill.

## 2019-05-06 NOTE — Telephone Encounter (Signed)
Copied from [place] [id number]. Topic: General - Other >> May 06, 2019 10:11 AM Carolyn Stare wrote: Pharmacy told pt that since this is his time they can only give him a weeks worth so they only gave him 28 pills

## 2019-05-12 ENCOUNTER — Other Ambulatory Visit: Payer: Self-pay

## 2019-05-12 ENCOUNTER — Encounter: Payer: Self-pay | Admitting: Nurse Practitioner

## 2019-05-12 ENCOUNTER — Ambulatory Visit (INDEPENDENT_AMBULATORY_CARE_PROVIDER_SITE_OTHER): Payer: Medicaid Other | Admitting: Nurse Practitioner

## 2019-05-12 DIAGNOSIS — G894 Chronic pain syndrome: Secondary | ICD-10-CM | POA: Diagnosis not present

## 2019-05-12 MED ORDER — HYDROCODONE-ACETAMINOPHEN 5-325 MG PO TABS: 1.0000 | ORAL_TABLET | Freq: Four times a day (QID) | ORAL | 0 refills | Status: DC | PRN

## 2019-05-12 NOTE — Patient Instructions (Signed)
Pain Medicine Instructions  You may need pain medicine after an injury or illness. There are many types of pain medicine. Two common types of pain medicine are:   Non-opioid pain medicines. These include:  ? Over-the-counter (OTC) medicines such as acetaminophen or non-steroidal anti-inflammatory medicines (NSAIDS).  ? Prescription medicines such as gabapentin or pregabalin.   Opioid prescription pain medicines. These include:  ? Hydrocodone.  ? Oxycodone.  ? Tramadol.  It is important to follow instructions from your health care provider when you are taking prescription pain medicines. It is also important to take steps to keep others safe while taking pain medicine, such as avoiding certain activities and storing your medicines safely.  How can pain medicine affect me?  Pain medicine may be prescribed to relieve most or all of your pain. It may not be possible to make all of your pain go away, but you should be comfortable enough to move, breathe, and do normal activities.  Opioid pain medicines can cause side effects, such as:   Constipation.   Nausea.   Vomiting.   Drowsiness.   Confusion.   Taking opioids for nonmedical reasons even though taking them hurts your health and well-being (opioid use disorder).   Difficulty breathing (respiratory depression).  Using opioid pain medicines for longer than 3 days increases your risk of these side effects.  Taking opioid pain medicine for a long period of time can affect your ability to do daily tasks. It also puts you at risk for:   Motor vehicle accidents.   Depression.   Suicide.   Heart attack.   Taking too much of the medicine (overdose), which can sometimes lead to death.  What actions can I take to lower my risk of problems?  Take your medicine as directed     Take your pain medicine exactly as told by your health care provider. Take it only when you need it.  ? If your pain gets less severe, you may take less than your prescribed dose if your  health care provider approves.  ? If you are not having pain, do not take pain medicine unless your health care provider tells you to take it.   If your pain is severe, do not try to treat it yourself by taking more pills than instructed on your prescription. Contact your health care provider for help.   Write down the times when you take your pain medicine. It is easy to become confused while on pain medicine. Writing the time down can help you avoid overdose.   Take other over-the-counter or prescription medicines only as told by your health care provider.  ? If your pain medicine contains acetaminophen, do not take any other acetaminophen while taking this medicine. Acetaminophen is found in many over-the-counter and prescription medicines. An overdose of acetaminophen can result in severe liver damage.   To prevent or treat constipation while you are taking prescription pain medicine:  ? Drink enough water to keep your urine pale yellow.  ? Eat more fruits and vegetables.  ? Use a stool softener as recommended or prescribed by your health care provider.  Restrict your activity as directed  While you are taking prescription pain medicine, and for 8 hours after your last dose, follow these instructions:   Do not drive.   Do not use machinery or power tools.   Do not sign legal documents.   Do not drink alcohol.   Do not take sleeping pills.   Do not supervise   children by yourself.   Do not participate in activities that require climbing or being in high places.   Do not enter a body of water-such as a lake, river, ocean, spa, or swimming pool-unless an adult is nearby who can monitor and help you.    Keep others safe while you are taking prescription pain medicine   Keep prescription pain medicine in a locked cabinet, or in a secure area where children and pets cannot reach it.   Never share your prescription pain medicine with anyone.   Do not save any leftover pills. If you have leftover medicine,  you can:  ? Bring the medicine to a prescription take-back program. This is usually offered by the county or law enforcement.  ? Bring it to a pharmacy that has a drug disposal container.  ? Throw it out in the trash. Check the label or package insert of your medicine to see whether this is safe to do. If it is safe to throw it out, remove the medicine from the container and mix it with material that makes it unusable (such as pet waste) before putting it in the trash.  Know your pain treatment plan  To manage your pain successfully, you and your health care provider need to understand each other and work together. To do this:   Discuss the goals of your treatment, including how much pain you might expect to have and how you will manage the pain.   Ask your health care provider to refer you to one or more specialists who can help you manage pain in other ways. Some other ways of managing pain include physical therapy, exercise, massage, or biofeedback.   Review the risks and benefits of taking pain medicines for your condition.   Be honest about the amount of medicines you take, and about any drug or alcohol use.   Get pain medicine prescriptions from only one health care provider.   Keep all follow-up visits as told by your health care provider. This is important.  Contact a health care provider if:   Your medicine is not relieving your pain.   You have a rash.   You feel depressed.  Get help right away if:  Seek medical care right away if you are taking pain medicines and you (or people close to you) notice any of the following:   Difficulty breathing.   Breathing that is slower or more shallow than normal.   Confusion.   Sleepiness or difficulty staying awake.   Nausea and vomiting.   Your skin or lips turning pale or bluish in color.   Tongue swelling.  If you ever feel like you may hurt yourself or others, or have thoughts about taking your own life, get help right away. You can go to your  nearest emergency department or call:   Your local emergency services (911 in the [place]   A suicide crisis helpline, such as the National Suicide Prevention Lifeline at 1-800-273-8255. This is open 24 hours a day.  Summary   It is important to follow instructions from your health care provider when you are taking prescription pain medicines.   Pain medicine can help reduce pain but may also cause side effects. Make sure you understand the risks and benefits of taking pain medicine for your condition.   Take steps to lower your risk of problems by taking medicine exactly as told, restricting activity, keeping others safe, preventing constipation, and having a pain treatment plan.  This

## 2019-05-12 NOTE — Progress Notes (Signed)
BP (!) 149/85   Pulse 82   Temp 98.8 F (37.1 C) (Oral)   SpO2 98%    Subjective:    Patient ID: Luke Briggs., male    DOB: [birth year], 58 y.o.   MRN: [id number]  HPI: Luke Briggs. is a 58 y.o. male  Chief Complaint  Patient presents with  . Pain   CHRONIC PAIN: On Norco for two years, currently takes this four times a day. Has arthritis in neck, knees, hands, lower back. States he has deterioration ofdiscsin neck.  [facility].Reviewed database and noted most recent refill by this provider was 03/03/2019 in [place] for 120 tablets.[facility] in [place], [place]. Urine drug screen negative on 04/28/2019, had been without pain meds for 3 weeks. He is very accepting of UDS and monitoring as did this with previous doctor.  Reports pain at baseline is10/10 at worst and 7/10 at best to knees, neck, hands, neck. Has had injections in both knees, steroid which helped for 1-2 weeks. No history epidural injections. Took Gabapentin at one time, but no longer takes as it made sick to stomach. States he is not a drug addict and reports willingness to comply by rules of any pain clinic he attends. States he has tried all things for pain and Norco works best.   Has a history of drinking heavily, but reports now he does not. Reports he drinks only occasionally now, 1-2 times a week.  Denies illegal drug use. We discussed that would provide 30 days medication today until can get into pain clinic.  Referral authorized on review and awaiting appointment. Will follow-up on this.  Relevant past medical, surgical, family and social history reviewed and updated as indicated. Interim medical history since our last visit reviewed. Allergies and medications reviewed and updated.  Review of Systems  Constitutional: Negative for activity change, diaphoresis, fatigue and fever.  Respiratory: Negative for  cough, chest tightness, shortness of breath and wheezing.   Cardiovascular: Negative for chest pain, palpitations and leg swelling.  Gastrointestinal: Negative for abdominal distention, abdominal pain, constipation, diarrhea, nausea and vomiting.  Musculoskeletal: Negative.   Skin: Negative.   Neurological: Negative for dizziness, syncope, weakness, light-headedness, numbness and headaches.  Psychiatric/Behavioral: Negative.     Per HPI unless specifically indicated above     Objective:    BP (!) 149/85   Pulse 82   Temp 98.8 F (37.1 C) (Oral)   SpO2 98%   Wt Readings from Last 3 Encounters:  No data found for Wt    Physical Exam Vitals signs and nursing note reviewed.  Constitutional:      General: He is awake. He is not in acute distress.    Appearance: He is well-developed. He is not ill-appearing.  HENT:     Head: Normocephalic and atraumatic.     Right Ear: Hearing normal. No drainage.     Left Ear: Hearing normal. No drainage.     Mouth/Throat:     Pharynx: Uvula midline.  Eyes:     General: Lids are normal.        Right eye: No discharge.        Left eye: No discharge.     Conjunctiva/sclera: Conjunctivae normal.     Pupils: Pupils are equal, round, and reactive to light.  Neck:     Musculoskeletal: Normal range of motion and neck supple.     Thyroid: No thyromegaly.  Cardiovascular:  Rate and Rhythm: Normal rate and regular rhythm.     Heart sounds: Normal heart sounds, S1 normal and S2 normal. No murmur. No gallop.   Pulmonary:     Effort: Pulmonary effort is normal. No accessory muscle usage or respiratory distress.     Breath sounds: Normal breath sounds.  Abdominal:     General: Bowel sounds are normal.     Palpations: Abdomen is soft. There is no hepatomegaly or splenomegaly.  Musculoskeletal: Normal range of motion.     Right lower leg: No edema.     Left lower leg: No edema.  Skin:    General: Skin is warm and dry.     Capillary Refill:  Capillary refill takes less than 2 seconds.     Findings: No rash.  Neurological:     Mental Status: He is alert and oriented to person, place, and time.     Deep Tendon Reflexes: Reflexes are normal and symmetric.  Psychiatric:        Mood and Affect: Mood normal.        Behavior: Behavior normal. Behavior is cooperative.        Thought Content: Thought content normal.        Judgment: Judgment normal.     Results for orders placed or performed in visit on 05/05/19  CBC with Differential/Platelet  Result Value Ref Range   WBC 8.6 3.4 - 10.8 x10E3/uL   RBC 5.20 4.14 - 5.80 x10E6/uL   Hemoglobin 16.0 13.0 - 17.7 g/dL   Hematocrit 46.1 37.5 - 51.0 %   MCV 89 79 - 97 fL   MCH 30.8 26.6 - 33.0 pg   MCHC 34.7 31.5 - 35.7 g/dL   RDW 12.3 11.6 - 15.4 %   Platelets 296 150 - 450 x10E3/uL   Neutrophils 57 Not Estab. %   Lymphs 31 Not Estab. %   Monocytes 10 Not Estab. %   Eos 1 Not Estab. %   Basos 1 Not Estab. %   Neutrophils Absolute 4.9 1.4 - 7.0 x10E3/uL   Lymphocytes Absolute 2.7 0.7 - 3.1 x10E3/uL   Monocytes Absolute 0.9 0.1 - 0.9 x10E3/uL   EOS (ABSOLUTE) 0.1 0.0 - 0.4 x10E3/uL   Basophils Absolute 0.1 0.0 - 0.2 x10E3/uL   Immature Granulocytes 0 Not Estab. %   Immature Grans (Abs) 0.0 0.0 - 0.1 x10E3/uL  Comprehensive metabolic panel  Result Value Ref Range   Glucose 73 65 - 99 mg/dL   BUN 8 6 - 24 mg/dL   Creatinine, Ser 0.92 0.76 - 1.27 mg/dL   GFR calc non Af Amer 93 >59 mL/min/1.73   GFR calc Af Amer 108 >59 mL/min/1.73   BUN/Creatinine Ratio 9 9 - 20   Sodium 141 134 - 144 mmol/L   Potassium 3.9 3.5 - 5.2 mmol/L   Chloride 105 96 - 106 mmol/L   CO2 24 20 - 29 mmol/L   Calcium 8.9 8.7 - 10.2 mg/dL   Total Protein 6.6 6.0 - 8.5 g/dL   Albumin 3.8 3.8 - 4.9 g/dL   Globulin, Total 2.8 1.5 - 4.5 g/dL   Albumin/Globulin Ratio 1.4 1.2 - 2.2   Bilirubin Total 0.4 0.0 - 1.2 mg/dL   Alkaline Phosphatase 82 39 - 117 IU/L   AST 27 0 - 40 IU/L   ALT 25 0 - 44 IU/L   Lipid Panel w/o Chol/HDL Ratio  Result Value Ref Range   Cholesterol, Total 166 100 - 199 mg/dL   Triglycerides  121 0 - 149 mg/dL   HDL 56 >39 mg/dL   VLDL Cholesterol Cal 24 5 - 40 mg/dL   LDL Calculated 86 0 - 99 mg/dL  TSH  Result Value Ref Range   TSH 1.090 0.450 - 4.500 uIU/mL  HIV Antibody (routine testing w rflx)  Result Value Ref Range   HIV Screen 4th Generation wRfx Non Reactive Non Reactive  Hepatitis C antibody  Result Value Ref Range   Hep C Virus Ab <0.1 0.0 - 0.9 s/co ratio      Assessment & Plan:   Problem List Items Addressed This Visit      Other   Chronic pain syndrome    Chronic, ongoing.  Awaiting pain clinic appointment.  Received initial 7 day supply, returns today for 30 day supply.  Script sent for #120 pills with 0 refills.  Aware of risk of opioid medication.  Has controlled substance contract on file.  Return in 4 weeks.      Relevant Medications   HYDROcodone-acetaminophen (NORCO/VICODIN) 5-325 MG tablet       Follow up plan: Return in about 4 weeks (around 06/09/2019) for HTN, pain clinic.

## 2019-05-12 NOTE — Assessment & Plan Note (Signed)
Chronic, ongoing.  Awaiting pain clinic appointment.  Received initial 7 day supply, returns today for 30 day supply.  Script sent for #120 pills with 0 refills.  Aware of risk of opioid medication.  Has controlled substance contract on file.  Return in 4 weeks.

## 2019-05-17 ENCOUNTER — Telehealth: Payer: Self-pay | Admitting: Nurse Practitioner

## 2019-05-17 NOTE — Telephone Encounter (Signed)
Pt's insurance has only been covering the medication below for 7 day supplies. Pt is trying to get things straightened out with them but will need a refill for Wednesday through Wednesday for HYDROcodone-acetaminophen (NORCO/VICODIN) 5-325 MG tablet    Please advise   [place], [place] - [place] [phone] (Phone) [phone] (Fax)

## 2019-05-17 NOTE — Telephone Encounter (Signed)
Noted, will call refill in Wednesday.  Not sure why this is getting covered this way as patient has had initial fill and should be able to get chronic pain medication fills now.  Can we also look into pain clinic appointment?  Still not scheduled, although authorized.  Thanks

## 2019-05-18 NOTE — Telephone Encounter (Signed)
Submitted PA through medicaid. Approved for 120 tablets for 30 days. Length of therapy 60 days.   Confirmation #:[id number] W Prior Approval [id number]  Will look into pain management referral.

## 2019-05-19 ENCOUNTER — Other Ambulatory Visit: Payer: Self-pay | Admitting: Nurse Practitioner

## 2019-05-19 MED ORDER — HYDROCODONE-ACETAMINOPHEN 5-325 MG PO TABS: 1.0000 | ORAL_TABLET | Freq: Four times a day (QID) | ORAL | 0 refills | Status: DC | PRN

## 2019-05-19 NOTE — Telephone Encounter (Signed)
Patient calling to check and see if refill has been sent to the pharmacy yet. States that his insurance is only covering 1 week at a time. Would like a call once this prescription is sent to the pharmacy.  [place], [place] - [address]

## 2019-05-19 NOTE — Telephone Encounter (Signed)
PA information was sent to [place] correct?  Why will they not fill?

## 2019-05-19 NOTE — Telephone Encounter (Signed)
Pt called stating the pharmacy refused to fill medication. Pt states they asked him for a PA for the medication. Pt is confused because he states he has been taking it for years. Please advise.

## 2019-05-19 NOTE — Progress Notes (Signed)
Unable to fill 120 due to current PA on medication.  Was notified to supply 7 day supply until PA approved.

## 2019-05-19 NOTE — Telephone Encounter (Signed)
Completed.

## 2019-05-19 NOTE — Telephone Encounter (Signed)
Can he present his [employer] insurance to [facility]?  That is my only thought.

## 2019-05-19 NOTE — Telephone Encounter (Signed)
Spoke with pharmacist at [facility].  Please send in new rx for hydrocodone for #120.  Patient filled #28 on 05/12/2019 and forfeited the remaining from the 120 script.  The remained of the Rx was canceled and they can't go back into that one to fill.   Routing to provider.

## 2019-05-19 NOTE — Telephone Encounter (Signed)
Patient has separate insurance for medications with [employer].  PA sent via covermymeds Key: [id number]  [name], any advice till this prior Josem Kaufmann comes back?

## 2019-05-19 NOTE — Telephone Encounter (Signed)
[facility] and spoke with [place].  I'd ask if she see if the Rx went through since I got it approved. She stated that she hasn't ran in yet but when she does she'll give me a call back.   Creating CRM. PEC please forward information about Rx when pharmacist calls back.

## 2019-05-20 NOTE — Telephone Encounter (Addendum)
Spoke with [name].  Patient has separate insurance that his medications were being ran under. Not medicaid. [name] sent in 7 day supply due to me submitting new PA via covermymeds.  Called to notify pharmacy. They did not have patient's [place] Medicaid card on file. Medicaid information given and medication went through as being covered. Spoke with [name] again and told Melanie at the pharmacy to d/c the 7 day supply of oxycodone and fill the #120 since it went through [facility].   Called patient and patient was notified he could pick up his 30 day supply of medication at pharmacy. Patient verbalized understanding. PA still pending with additional insurance.  Routing to [employer] as FYI.

## 2019-05-20 NOTE — Telephone Encounter (Signed)
Thank you for all your help on this one.  It was wonderful.

## 2019-05-31 ENCOUNTER — Emergency Department
Admission: EM | Admit: 2019-05-31 | Discharge: 2019-05-31 | Disposition: A | Discharge status: Home / Self Care | Payer: Medicaid Other | Attending: Emergency Medicine | Admitting: Emergency Medicine

## 2019-05-31 ENCOUNTER — Other Ambulatory Visit: Payer: Self-pay

## 2019-05-31 ENCOUNTER — Emergency Department: Payer: Medicaid Other

## 2019-05-31 ENCOUNTER — Encounter: Payer: Self-pay | Admitting: Emergency Medicine

## 2019-05-31 DIAGNOSIS — Z20828 Contact with and (suspected) exposure to other viral communicable diseases: Secondary | ICD-10-CM | POA: Insufficient documentation

## 2019-05-31 DIAGNOSIS — J441 Chronic obstructive pulmonary disease with (acute) exacerbation: Secondary | ICD-10-CM | POA: Insufficient documentation

## 2019-05-31 DIAGNOSIS — R05 Cough: Secondary | ICD-10-CM | POA: Diagnosis present

## 2019-05-31 DIAGNOSIS — J40 Bronchitis, not specified as acute or chronic: Secondary | ICD-10-CM | POA: Diagnosis not present

## 2019-05-31 DIAGNOSIS — Z79899 Other long term (current) drug therapy: Secondary | ICD-10-CM | POA: Diagnosis not present

## 2019-05-31 DIAGNOSIS — F17210 Nicotine dependence, cigarettes, uncomplicated: Secondary | ICD-10-CM | POA: Insufficient documentation

## 2019-05-31 DIAGNOSIS — I10 Essential (primary) hypertension: Secondary | ICD-10-CM | POA: Diagnosis not present

## 2019-05-31 DIAGNOSIS — F17200 Nicotine dependence, unspecified, uncomplicated: Secondary | ICD-10-CM

## 2019-05-31 LAB — CBC
HCT: 46.5 % (ref 39.0–52.0)
Hemoglobin: 16 g/dL (ref 13.0–17.0)
MCH: 30.9 pg (ref 26.0–34.0)
MCHC: 34.4 g/dL (ref 30.0–36.0)
MCV: 89.8 fL (ref 80.0–100.0)
Platelets: 297 10*3/uL (ref 150–400)
RBC: 5.18 MIL/uL (ref 4.22–5.81)
RDW: 13.6 % (ref 11.5–15.5)
WBC: 9.2 10*3/uL (ref 4.0–10.5)
nRBC: 0 % (ref 0.0–0.2)

## 2019-05-31 LAB — BASIC METABOLIC PANEL
Anion gap: 9 (ref 5–15)
BUN: 10 mg/dL (ref 6–20)
CO2: 25 mmol/L (ref 22–32)
Calcium: 8.8 mg/dL — ABNORMAL LOW (ref 8.9–10.3)
Chloride: 99 mmol/L (ref 98–111)
Creatinine, Ser: 0.85 mg/dL (ref 0.61–1.24)
GFR calc Af Amer: >60 mL/min (ref >60)
GFR calc non Af Amer: >60 mL/min (ref >60)
Glucose, Bld: 102 mg/dL — ABNORMAL HIGH (ref 70–99)
Potassium: 4.5 mmol/L (ref 3.5–5.1)
Sodium: 133 mmol/L — ABNORMAL LOW (ref 135–145)

## 2019-05-31 LAB — TROPONIN I (HIGH SENSITIVITY)
Troponin I (High Sensitivity): 8 ng/L (ref <18)
Troponin I (High Sensitivity): 6 ng/L (ref <18)

## 2019-05-31 IMAGING — CR CHEST - 2 VIEW
1 series · 2 of 2 positions shown · non-contrast
Comparison: [DATE]

CLINICAL DATA: Cough and shortness of breath for 2 weeks.

EXAM:
CHEST - 2 VIEW

[Series 1: dg chest 2 view · 0.14mm/px · 2 of 2 slices shown]
[im 1/2]
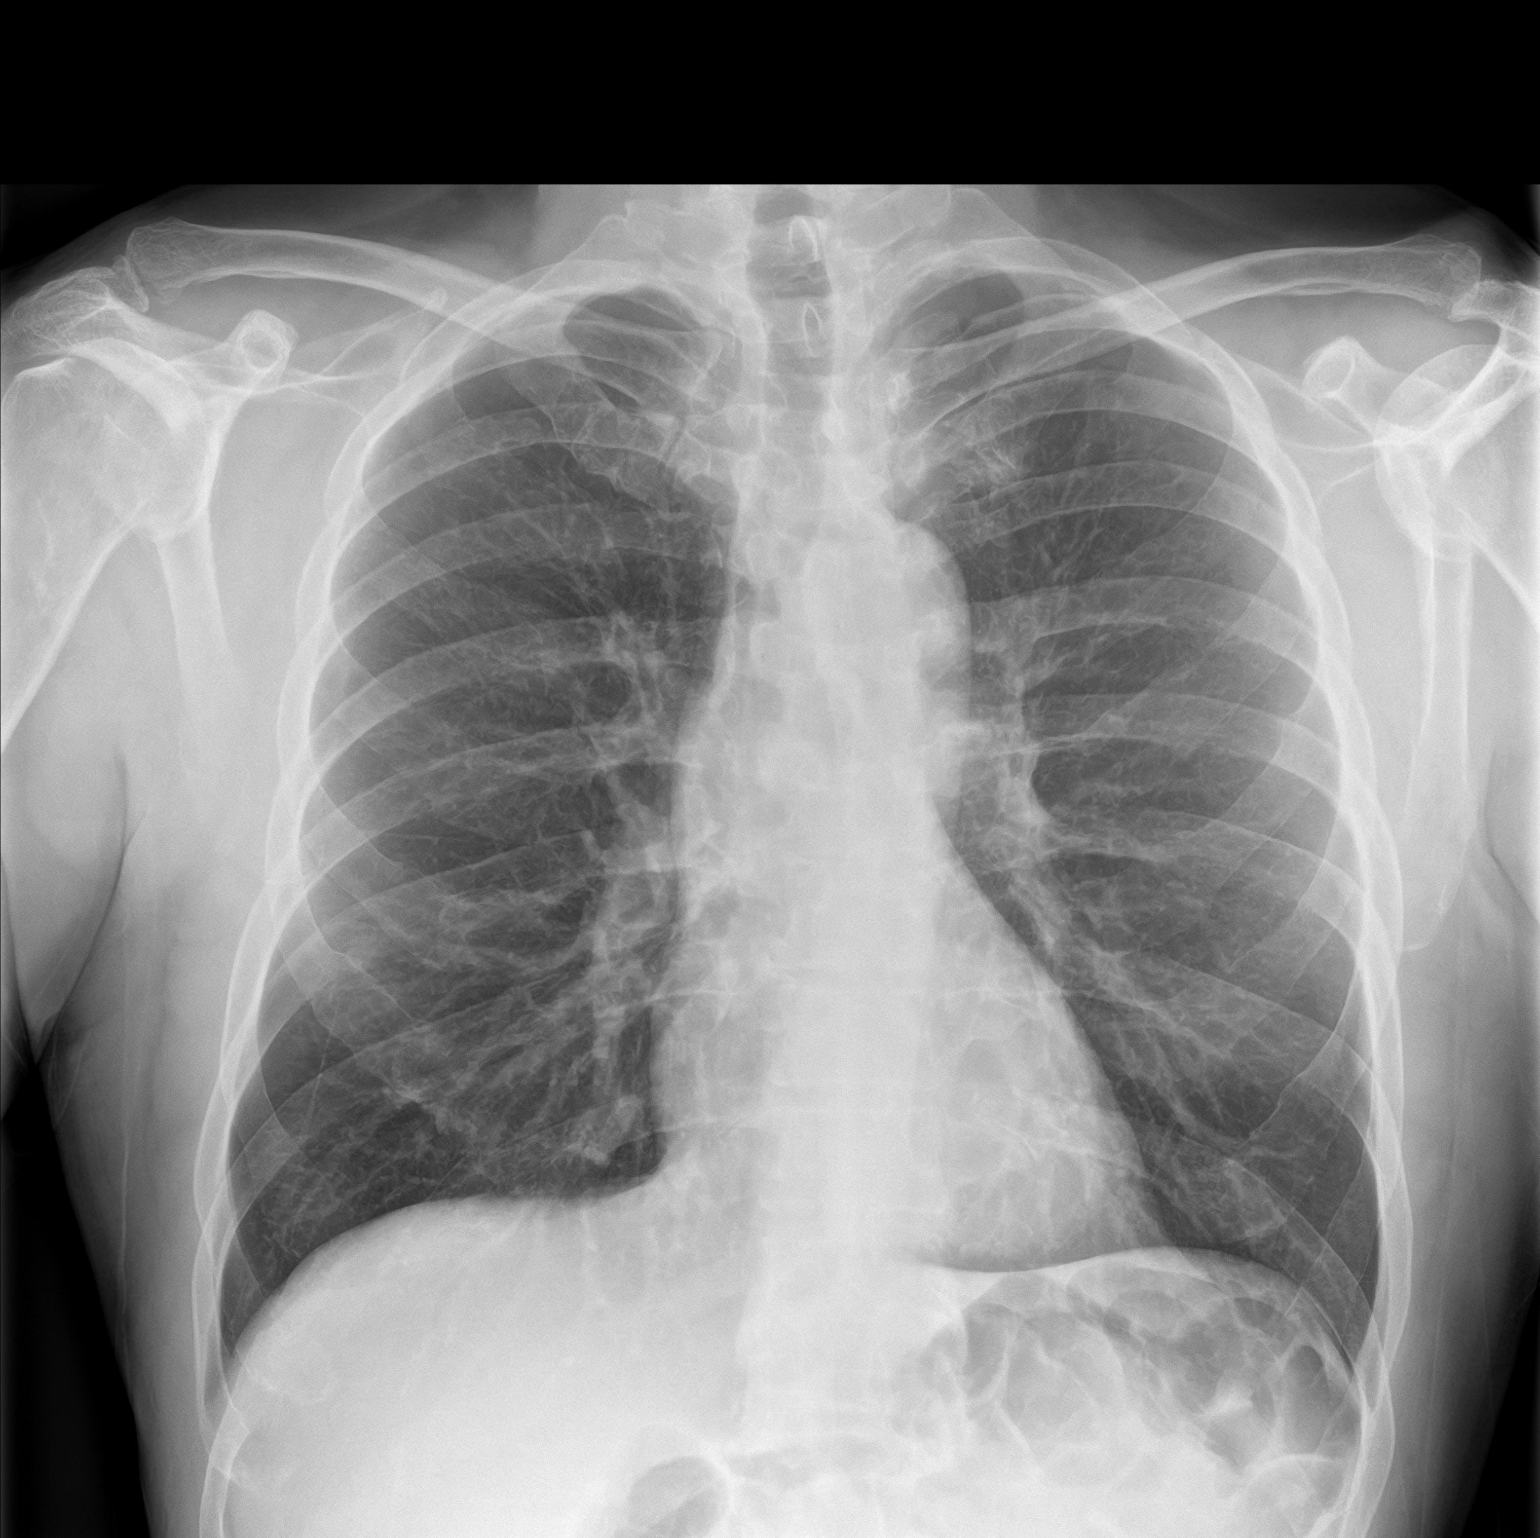
[im 2/2]
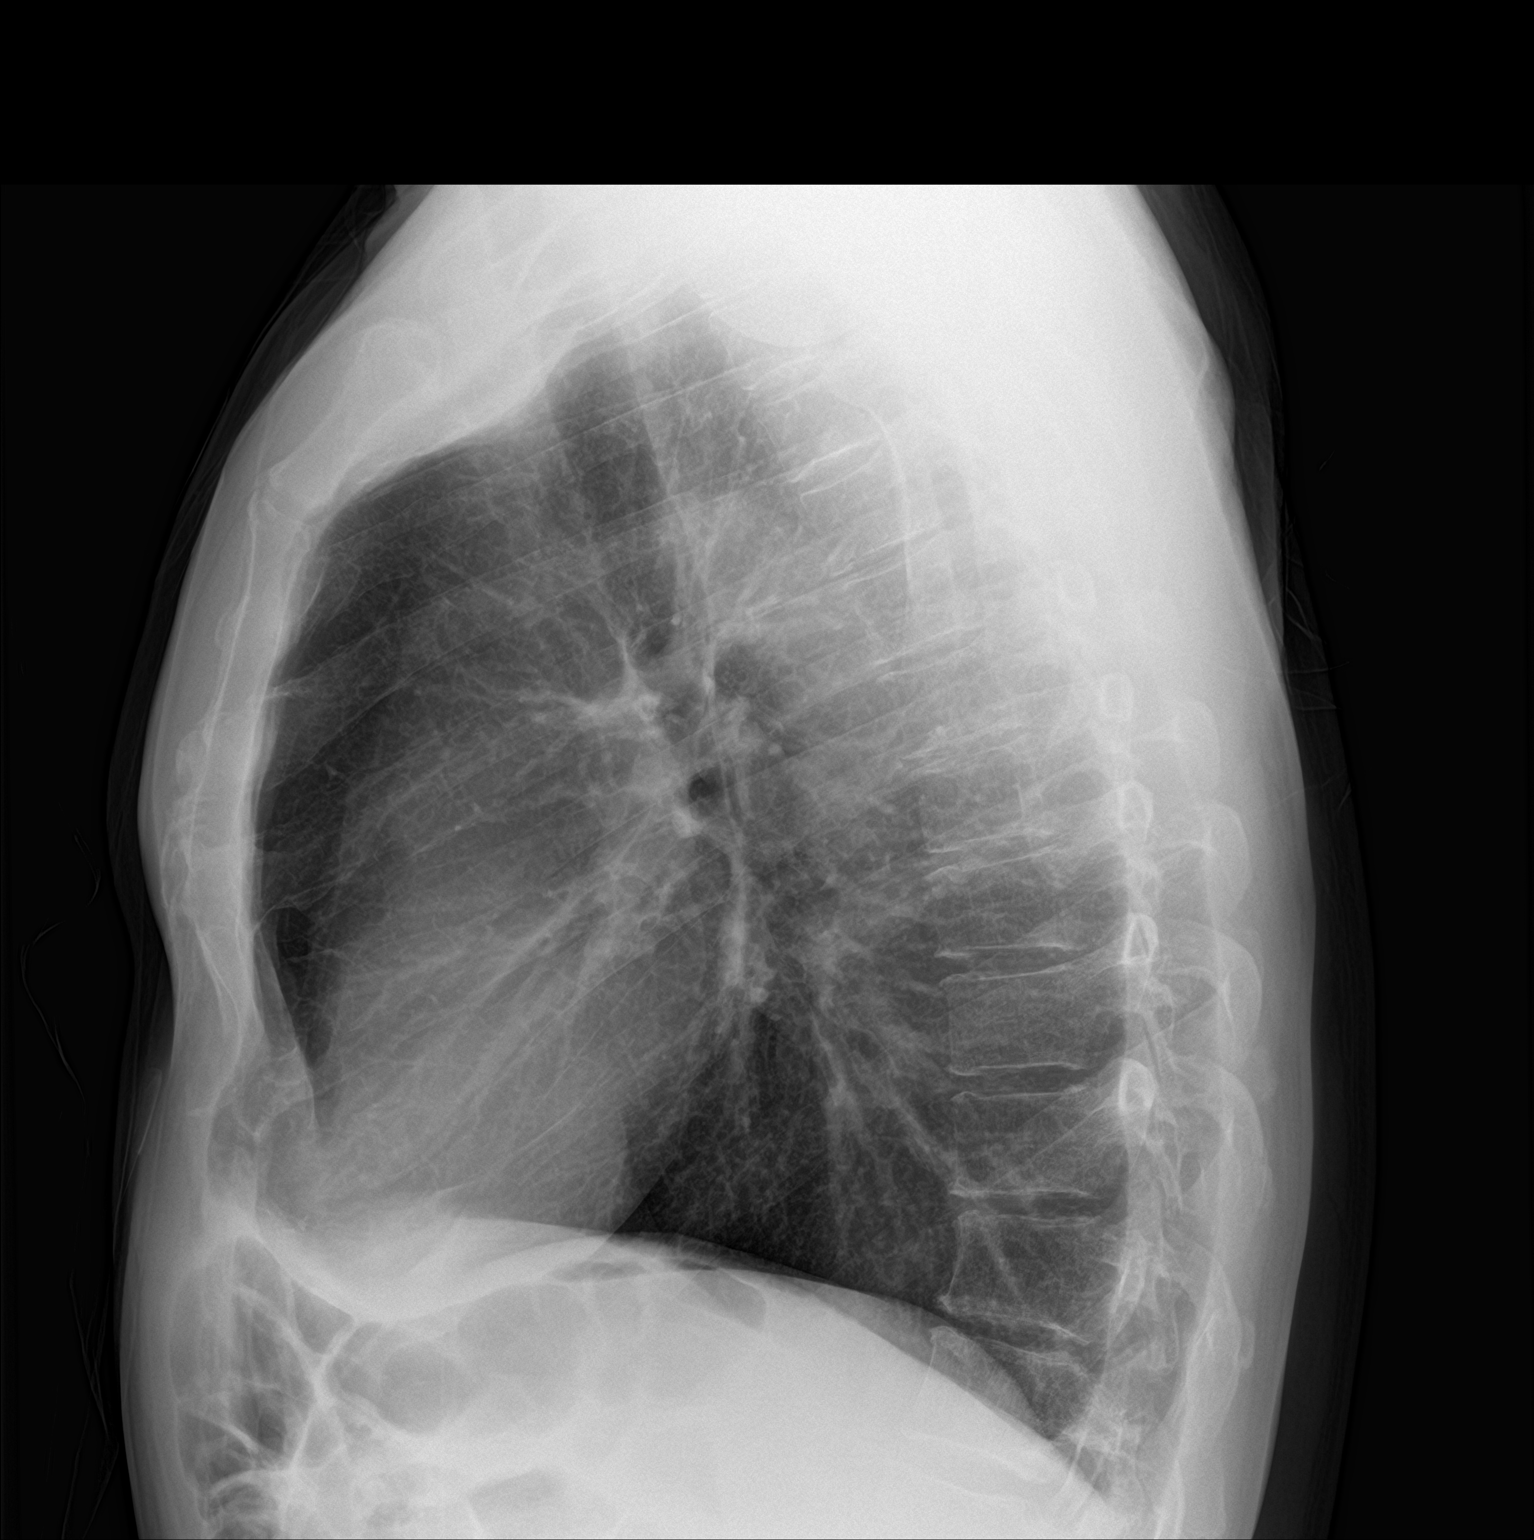

[2 of 2 positions shown; findings below may reference images not displayed]

FINDINGS: Cardiac silhouette is normal in size. No mediastinal or hilar
masses. No evidence of adenopathy.

Lungs are hyperexpanded, but clear.

No pleural effusion or pneumothorax.

Skeletal structures are intact.
IMPRESSION: 1. No acute cardiopulmonary disease.
2. COPD.

## 2019-05-31 MED ORDER — HYDROCODONE-ACETAMINOPHEN 5-325 MG PO TABS
2.0000 | ORAL_TABLET | Freq: Once | ORAL | Status: AC
  Administered 2019-05-31: 2 via ORAL
  Filled 2019-05-31: qty 2

## 2019-05-31 MED ORDER — SODIUM CHLORIDE 0.9% FLUSH: 3.0000 mL | Freq: Once | INTRAVENOUS | Status: DC

## 2019-05-31 MED ORDER — DOXYCYCLINE HYCLATE 100 MG PO CAPS: 100.0000 mg | ORAL_CAPSULE | Freq: Two times a day (BID) | ORAL | 0 refills | Status: AC

## 2019-05-31 MED ORDER — PROMETHAZINE-DM 6.25-15 MG/5ML PO SYRP: 5.0000 mL | ORAL_SOLUTION | Freq: Three times a day (TID) | ORAL | 0 refills | Status: DC | PRN

## 2019-05-31 MED ORDER — ALBUTEROL SULFATE HFA 108 (90 BASE) MCG/ACT IN AERS: 2.0000 | INHALATION_SPRAY | Freq: Four times a day (QID) | RESPIRATORY_TRACT | 0 refills | Status: DC | PRN

## 2019-05-31 MED ORDER — IPRATROPIUM-ALBUTEROL 0.5-2.5 (3) MG/3ML IN SOLN
3.0000 mL | Freq: Once | RESPIRATORY_TRACT | Status: AC
  Administered 2019-05-31: 3 mL via RESPIRATORY_TRACT
  Filled 2019-05-31: qty 3

## 2019-05-31 MED ORDER — PREDNISONE 20 MG PO TABS
60.0000 mg | ORAL_TABLET | Freq: Once | ORAL | Status: AC
  Administered 2019-05-31: 13:00:00 60 mg via ORAL
  Filled 2019-05-31: qty 3

## 2019-05-31 MED ORDER — PREDNISONE 20 MG PO TABS: 60.0000 mg | ORAL_TABLET | Freq: Every day | ORAL | 0 refills | Status: AC

## 2019-05-31 NOTE — ED Triage Notes (Signed)
C/O 2 week history of cough and SOB.  Denies fever.

## 2019-05-31 NOTE — ED Provider Notes (Signed)
[facility] Emergency Department Provider Note  ____________________________________________   First MD Initiated Contact with Patient 05/31/19 1205     (approximate)  I have reviewed the triage vital signs and the nursing notes.   HISTORY  Chief Complaint Cough    HPI Luke Briggs. is a 58 y.o. male  M with h/o COPD, HTN, schizophrenia, here with cough SOB. Pt states that over the last several days, he is a progressively worsening cough with wheezing.  It seems to be worse at night and when he wakes up, and he has had episodes in which she vomits after coughing for so much.  He states that he has felt short of breath, with only minimal relief with his rescue inhaler.  He denies any shortness of breath at rest.  Denies any fevers or chills.  No abdominal pain.  Is been able to eat and drink without difficulty and states his emesis episodes are only after coughing a significant amount of time.  He otherwise denies any acute complaints.  No recent sick contacts.  No loss of taste of sense or smell.        Past Medical History:  Diagnosis Date  . Arthritis   . COPD (chronic obstructive pulmonary disease) ([place])   . Hypertension   . Prostate disorder     Patient Active Problem List   Diagnosis Date Noted  . Controlled substance agreement signed 05/05/2019  . Chronic pain syndrome 04/28/2019  . BPH (benign prostatic hyperplasia) 04/28/2019  . Eczema 04/28/2019  . Essential hypertension 04/28/2019  . GERD (gastroesophageal reflux disease) 04/28/2019  . Schizophrenia ([place]) 04/28/2019  . Chronic post-traumatic stress disorder (PTSD) 04/28/2019  . Hemorrhoids 04/28/2019  . Nicotine dependence, cigarettes, w unsp disorders 04/28/2019  . History of alcohol dependence ([place]) 04/28/2019    Past Surgical History:  Procedure Laterality Date  . EYE SURGERY Left     Prior to Admission medications   Medication Sig Start Date End Date Taking?  Authorizing Provider  albuterol (VENTOLIN HFA) 108 (90 Base) MCG/ACT inhaler Inhale 2 puffs into the lungs every 6 (six) hours as needed for wheezing or shortness of breath. 05/31/19   Duffy Bruce, MD  buPROPion (WELLBUTRIN XL) 150 MG 24 hr tablet Take 1 tablet (150 mg total) by mouth daily. 04/28/19   [name], [name], NP  cloNIDine (CATAPRES) 0.1 MG tablet Take 1 tablet (0.1 mg total) by mouth 2 (two) times daily. 04/28/19   [name], [name], NP  doxycycline (VIBRAMYCIN) 100 MG capsule Take 1 capsule (100 mg total) by mouth 2 (two) times daily for 7 days. 05/31/19 06/07/19  Duffy Bruce, MD  finasteride (PROSCAR) 5 MG tablet Take 1 tablet (5 mg total) by mouth daily. 04/28/19   [name], [name], NP  hydrocortisone (ANUSOL-HC) 2.5 % rectal cream Place 1 application rectally 2 (two) times daily. 04/28/19   [name], [name], NP  lisinopril (ZESTRIL) 10 MG tablet Take 1 tablet (10 mg total) by mouth daily. 05/05/19   [name], [name], NP  omeprazole (PRILOSEC) 40 MG capsule Take 1 capsule (40 mg total) by mouth daily. 04/28/19   [name], [name], NP  oxybutynin (DITROPAN) 5 MG tablet Take 1 tablet (5 mg total) by mouth 2 (two) times daily. 04/28/19   [name], [name], NP  predniSONE (DELTASONE) 20 MG tablet Take 3 tablets (60 mg total) by mouth daily for 5 days. 05/31/19 06/05/19  Duffy Bruce, MD  promethazine-dextromethorphan (PROMETHAZINE-DM) 6.25-15 MG/5ML syrup Take  5 mLs by mouth 3 (three) times daily as needed for cough. 05/31/19   Duffy Bruce, MD  tamsulosin (FLOMAX) 0.4 MG CAPS capsule Take 1 capsule (0.4 mg total) by mouth daily. 04/28/19   [name], [name], NP  tiotropium (SPIRIVA HANDIHALER) 18 MCG inhalation capsule Place 1 capsule (18 mcg total) into inhaler and inhale daily. 04/28/19   [name], [name], NP  triamcinolone cream (KENALOG) 0.1 % Apply 1 application topically 2 (two) times daily. 04/28/19   Marnee Guarneri T, NP    Allergies Penicillins  Family History  Problem  Relation Age of Onset  . Heart disease Mother   . Osteoporosis Mother   . Heart disease Father   . Emphysema Father   . Heart disease Sister   . Emphysema Maternal Grandmother   . Heart disease Maternal Grandfather   . Osteoporosis Sister     Social History Social History   Tobacco Use  . Smoking status: Current Every Day Smoker    Packs/day: 1.00  . Smokeless tobacco: Never Used  Substance Use Topics  . Alcohol use: Yes    Comment: beer on occasion  . Drug use: Never    Review of Systems  Review of Systems  Constitutional: Positive for fatigue. Negative for chills and fever.  HENT: Negative for sore throat.   Respiratory: Positive for cough, shortness of breath and wheezing.   Cardiovascular: Negative for chest pain.  Gastrointestinal: Negative for abdominal pain.  Genitourinary: Negative for flank pain.  Musculoskeletal: Negative for neck pain.  Skin: Negative for rash and wound.  Allergic/Immunologic: Negative for immunocompromised state.  Neurological: Negative for weakness and numbness.  Hematological: Does not bruise/bleed easily.  All other systems reviewed and are negative.    ____________________________________________  PHYSICAL EXAM:      VITAL SIGNS: ED Triage Vitals  Enc Vitals Group     BP 05/31/19 0933 (!) 163/104     Pulse Rate 05/31/19 0933 80     Resp 05/31/19 0933 16     Temp 05/31/19 0933 99 F (37.2 C)     Temp Source 05/31/19 0933 Oral     SpO2 05/31/19 0933 98 %     Weight 05/31/19 0935 175 lb (79.4 kg)     Height 05/31/19 0935 5\' 10"  (1.778 m)     Head Circumference --      Peak Flow --      Pain Score 05/31/19 0934 8     Pain Loc --      Pain Edu? --      Excl. in [place]? --      Physical Exam Vitals signs and nursing note reviewed.  Constitutional:      General: He is not in acute distress.    Appearance: He is well-developed.  HENT:     Head: Normocephalic and atraumatic.  Eyes:     Conjunctiva/sclera: Conjunctivae  normal.  Neck:     Musculoskeletal: Neck supple.  Cardiovascular:     Rate and Rhythm: Normal rate and regular rhythm.     Heart sounds: Normal heart sounds. No murmur. No friction rub.  Pulmonary:     Effort: Tachypnea and respiratory distress present.     Breath sounds: Wheezing present. No rales.  Abdominal:     General: There is no distension.     Palpations: Abdomen is soft.     Tenderness: There is no abdominal tenderness.  Skin:    General: Skin is warm.     Capillary Refill:  Capillary refill takes less than 2 seconds.  Neurological:     Mental Status: He is alert and oriented to person, place, and time.     Motor: No abnormal muscle tone.       ____________________________________________   LABS (all labs ordered are listed, but only abnormal results are displayed)  Labs Reviewed  BASIC METABOLIC PANEL - Abnormal; Notable for the following components:      Result Value   Sodium 133 (*)    Glucose, Bld 102 (*)    Calcium 8.8 (*)    All other components within normal limits  NOVEL CORONAVIRUS, NAA (HOSPITAL ORDER, SEND-OUT TO REF LAB)  CBC  TROPONIN I (HIGH SENSITIVITY)  TROPONIN I (HIGH SENSITIVITY)    ____________________________________________  EKG: Normal sinus rhythm, ventricular rate 76.  PR 120, QRS 78, QTc 409.  No acute ischemic changes. ________________________________________  RADIOLOGY All imaging, including plain films, CT scans, and ultrasounds, independently reviewed by me, and interpretations confirmed via formal radiology reads.  ED MD interpretation:   Chest x-ray: COPD, no acute abnormality, no focal pneumonia  Official radiology report(s): Dg Chest 2 View  Result Date: 05/31/2019 CLINICAL DATA:  Cough and shortness of breath for 2 weeks. EXAM: CHEST - 2 VIEW COMPARISON:  09/21/2008 FINDINGS: Cardiac silhouette is normal in size. No mediastinal or hilar masses. No evidence of adenopathy. Lungs are hyperexpanded, but clear. No pleural  effusion or pneumothorax. Skeletal structures are intact. IMPRESSION: 1. No acute cardiopulmonary disease. 2. COPD. Electronically Signed   By: Lajean Manes M.D.   On: 05/31/2019 11:39    ____________________________________________  PROCEDURES   Procedure(s) performed (including Critical Care):  Procedures  ____________________________________________  INITIAL IMPRESSION / MDM / [place] / ED COURSE  As part of my medical decision making, I reviewed the following data within the electronic MEDICAL RECORD NUMBER Notes from prior ED visits and  Controlled Substance [place]. was evaluated in Emergency Department on 05/31/2019 for the symptoms described in the history of present illness. He was evaluated in the context of the global COVID-19 pandemic, which necessitated consideration that the patient might be at risk for infection with the SARS-CoV-2 virus that causes COVID-19. Institutional protocols and algorithms that pertain to the evaluation of patients at risk for COVID-19 are in a state of rapid change based on information released by regulatory bodies including the CDC and federal and state organizations. These policies and algorithms were followed during the patient's care in the ED.  Some ED evaluations and interventions may be delayed as a result of limited staffing during the pandemic.*   Clinical Course as of May 30 1299  Mon May 31, 2019  1219 58 yo M here with acute on chronic SOB. Suspect COPD vs early bronchitis/CAP. He is afebrile, non-toxic, satting well on RA. CXR w/o PNA or signs of CHF. Exam, history is not c/w PE and he is not hypoxic or tachycardic. H/o severe COPD w/ ongoing smoking/tobacco dependence. EKG non-ischemic, trop neg, doutb ACS. Will send outpt COVID testing, start on steroids/empiric ABX and d/c with good return precautions. Smoking cessation discussed at length.   [CI]  1244 Trop neg x 2. Doubt ACS. Will d/c with tx for  COPD, bronchitis.   [CI]    Clinical Course User Index [CI] Duffy Bruce, MD    Medical Decision Making: As above. Likely COPD/bronchitis. Satting well, nontoxic and in NAD.  ____________________________________________  FINAL CLINICAL IMPRESSION(S) /  ED DIAGNOSES  Final diagnoses:  Chronic obstructive pulmonary disease with acute exacerbation (HCC)  Bronchitis  Tobacco dependence     MEDICATIONS GIVEN DURING THIS VISIT:  Medications  sodium chloride flush (NS) 0.9 % injection 3 mL (has no administration in time range)  predniSONE (DELTASONE) tablet 60 mg (60 mg Oral Given 05/31/19 1239)  ipratropium-albuterol (DUONEB) 0.5-2.5 (3) MG/3ML nebulizer solution 3 mL (3 mLs Nebulization Given 05/31/19 1241)  HYDROcodone-acetaminophen (NORCO/VICODIN) 5-325 MG per tablet 2 tablet (2 tablets Oral Given 05/31/19 1239)     ED Discharge Orders         Ordered    predniSONE (DELTASONE) 20 MG tablet  Daily     05/31/19 1259    doxycycline (VIBRAMYCIN) 100 MG capsule  2 times daily     05/31/19 1259    promethazine-dextromethorphan (PROMETHAZINE-DM) 6.25-15 MG/5ML syrup  3 times daily PRN     05/31/19 1259    albuterol (VENTOLIN HFA) 108 (90 Base) MCG/ACT inhaler  Every 6 hours PRN     05/31/19 1300           Note:  This document was prepared using Dragon voice recognition software and may include unintentional dictation errors.   Duffy Bruce, MD 05/31/19 1301

## 2019-05-31 NOTE — Discharge Instructions (Signed)
Use your albuterol/pro-air inhaler every 4 hours for the next 24 to 48 hours, then every 4 hours as needed for wheezing.  Take the remainder of the medications as prescribed.

## 2019-06-01 LAB — NOVEL CORONAVIRUS, NAA (HOSP ORDER, SEND-OUT TO REF LAB; TAT 18-24 HRS): SARS-CoV-2, NAA: NOT DETECTED

## 2019-06-07 ENCOUNTER — Ambulatory Visit: Payer: Medicaid Other | Admitting: Nurse Practitioner

## 2019-06-09 ENCOUNTER — Ambulatory Visit: Payer: Medicaid Other | Admitting: Nurse Practitioner

## 2019-06-15 ENCOUNTER — Ambulatory Visit: Payer: Self-pay

## 2019-06-15 ENCOUNTER — Emergency Department
Admission: EM | Admit: 2019-06-15 | Discharge: 2019-06-15 | Disposition: A | Discharge status: Home / Self Care | Payer: Medicaid Other | Attending: Emergency Medicine | Admitting: Emergency Medicine

## 2019-06-15 ENCOUNTER — Emergency Department: Payer: Medicaid Other

## 2019-06-15 ENCOUNTER — Encounter: Payer: Self-pay | Admitting: Emergency Medicine

## 2019-06-15 ENCOUNTER — Other Ambulatory Visit: Payer: Self-pay

## 2019-06-15 DIAGNOSIS — I10 Essential (primary) hypertension: Secondary | ICD-10-CM | POA: Insufficient documentation

## 2019-06-15 DIAGNOSIS — Z79899 Other long term (current) drug therapy: Secondary | ICD-10-CM | POA: Diagnosis not present

## 2019-06-15 DIAGNOSIS — F209 Schizophrenia, unspecified: Secondary | ICD-10-CM | POA: Insufficient documentation

## 2019-06-15 DIAGNOSIS — F17210 Nicotine dependence, cigarettes, uncomplicated: Secondary | ICD-10-CM | POA: Insufficient documentation

## 2019-06-15 DIAGNOSIS — R1110 Vomiting, unspecified: Secondary | ICD-10-CM | POA: Insufficient documentation

## 2019-06-15 DIAGNOSIS — J441 Chronic obstructive pulmonary disease with (acute) exacerbation: Secondary | ICD-10-CM | POA: Diagnosis not present

## 2019-06-15 DIAGNOSIS — R0602 Shortness of breath: Secondary | ICD-10-CM | POA: Diagnosis present

## 2019-06-15 IMAGING — CR CHEST - 2 VIEW
1 series · 2 of 2 positions shown · non-contrast
Comparison: [DATE]

CLINICAL DATA: Cough, vomiting

EXAM:
CHEST - 2 VIEW

[Series 1: dg chest 2 view · 0.14mm/px · 2 of 2 slices shown]
[im 1/2]
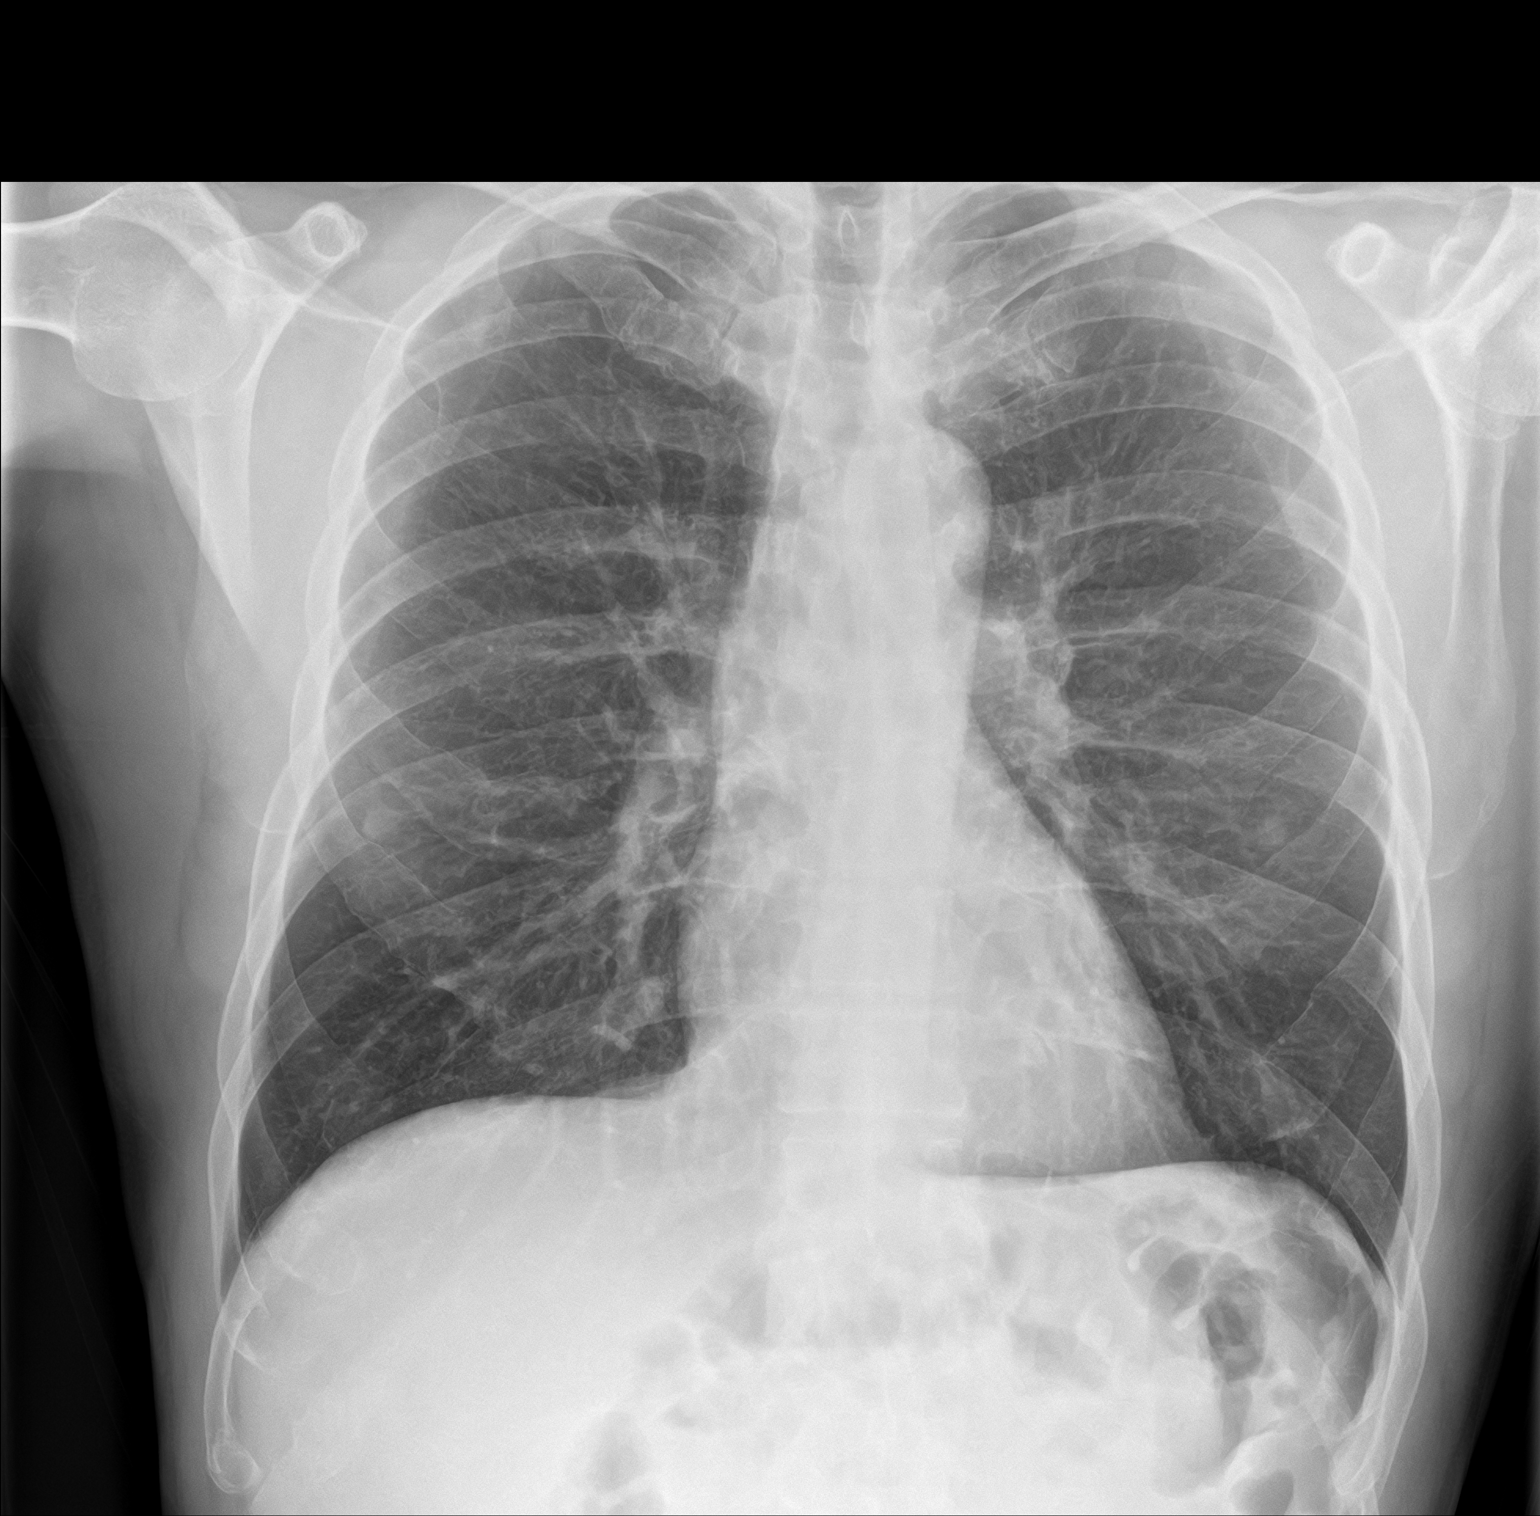
[im 2/2]
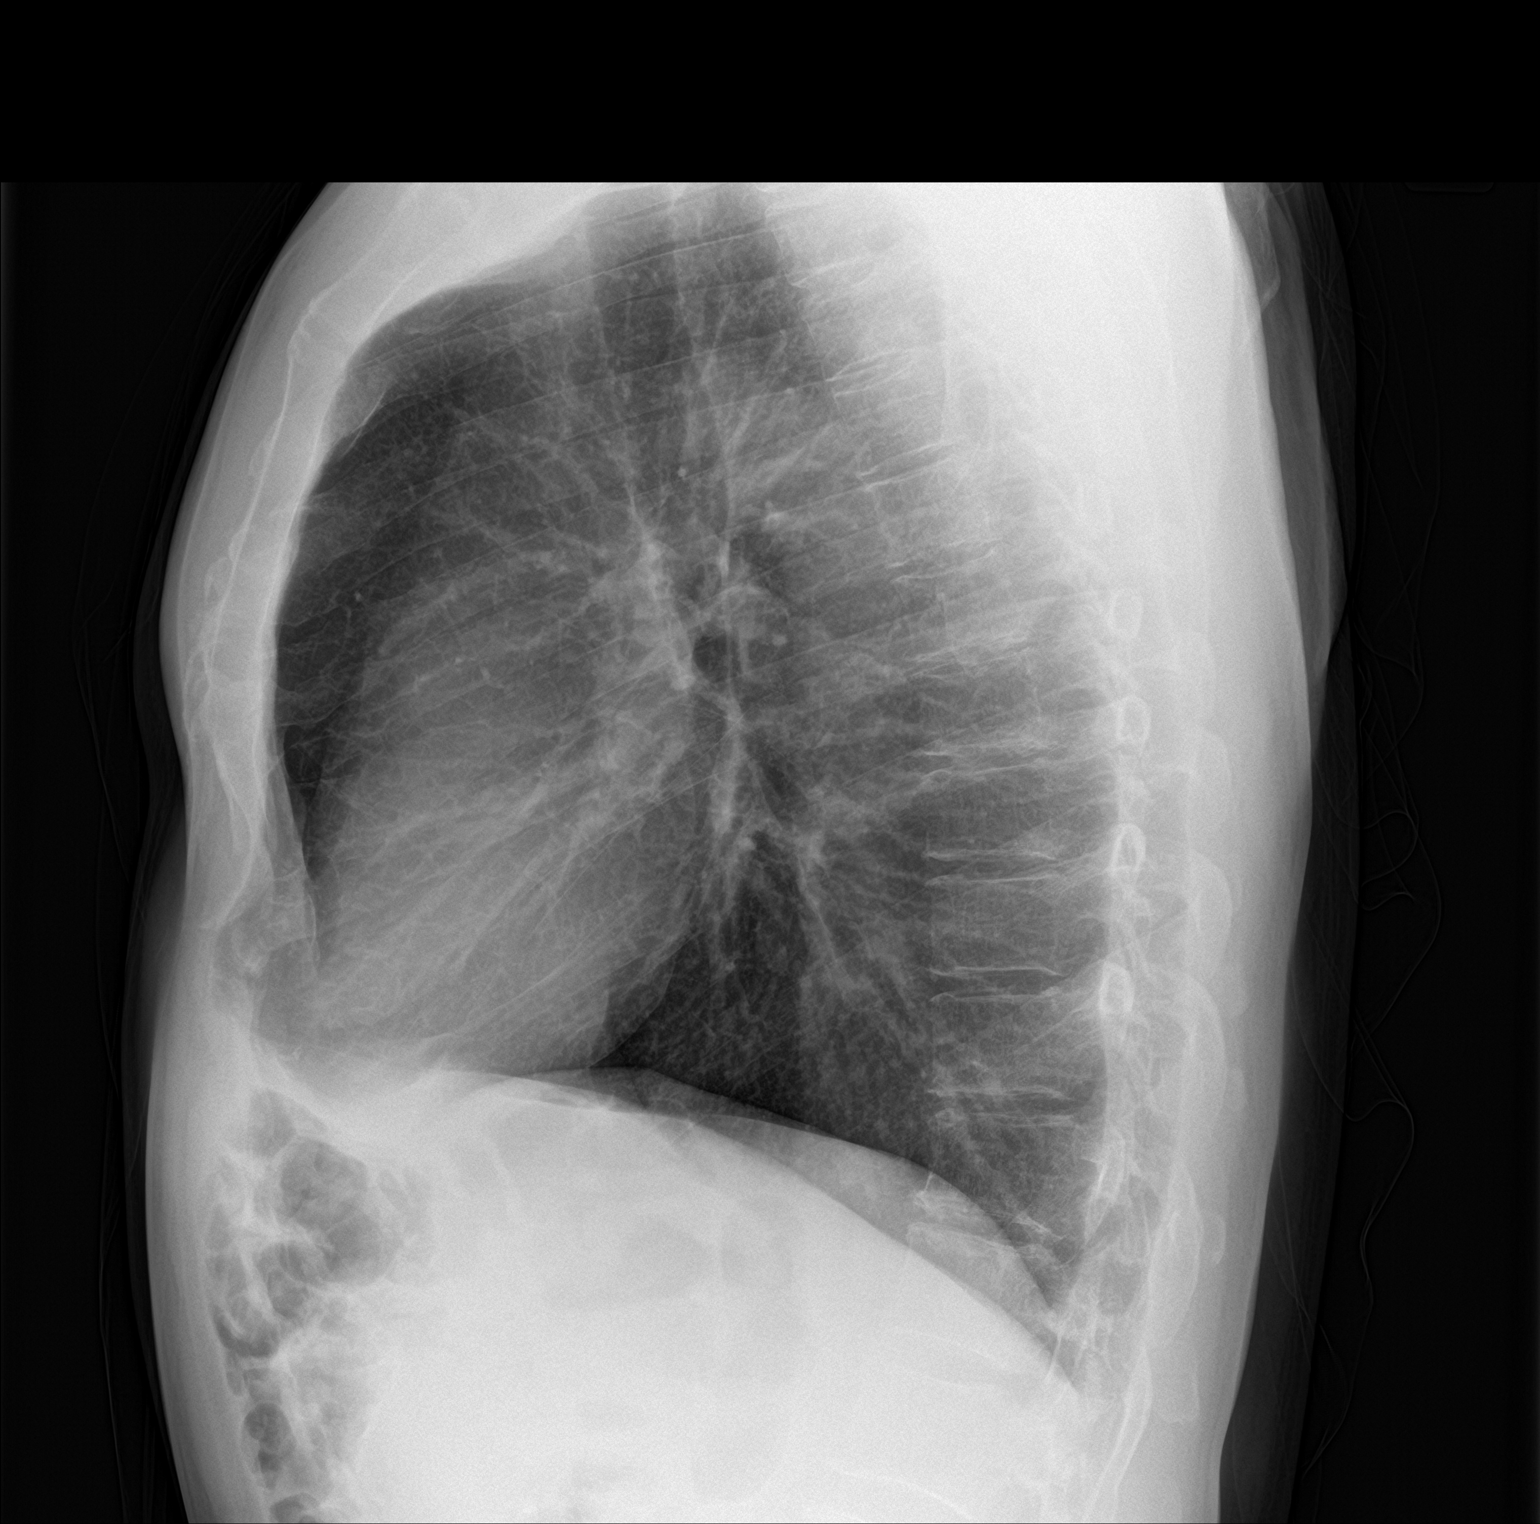

[2 of 2 positions shown; findings below may reference images not displayed]

FINDINGS: The heart size and mediastinal contours are within normal limits.
Both lungs are clear. Nipple shadows project over the bilateral mid
lungs. The visualized skeletal structures are unremarkable.
IMPRESSION: No acute abnormality of the lungs. No radiographic findings to
explain cough. Consider CT to further evaluate otherwise unexplained
persistent cough.

## 2019-06-15 MED ORDER — MONTELUKAST SODIUM 10 MG PO TABS: 10.0000 mg | ORAL_TABLET | Freq: Every day | ORAL | 0 refills | Status: DC

## 2019-06-15 MED ORDER — IPRATROPIUM-ALBUTEROL 20-100 MCG/ACT IN AERS: 1.0000 | INHALATION_SPRAY | Freq: Four times a day (QID) | RESPIRATORY_TRACT | 1 refills | Status: DC

## 2019-06-15 MED ORDER — DEXAMETHASONE SODIUM PHOSPHATE 10 MG/ML IJ SOLN
10.0000 mg | Freq: Once | INTRAMUSCULAR | Status: AC
  Administered 2019-06-15: 10 mg via INTRAMUSCULAR
  Filled 2019-06-15: qty 1

## 2019-06-15 NOTE — Telephone Encounter (Signed)
  Pt called to arrange appointment.  States 2 weeks ago he went to the ER with his symptoms. He states they tested him for the COVID-19. Test was negative. He states that his diagnoses was bronchitis or pneumonia. He has Hx of COPD. Pt states that his symptoms have gotten worse not better and he is really SOB. He has to vomit to breath. He states he is really sick.Triage ended and 911 was called and patient was connected to the operator for assistance.  Reason for Disposition . Sounds like a life-threatening emergency to the triager  Answer Assessment - Initial Assessment Questions 1. RESPIRATORY STATUS: "Describe your breathing?" (e.g., wheezing, shortness of breath, unable to speak, severe coughing)      sob 2. ONSET: "When did this breathing problem begin?"      2 weeks 3. PATTERN "Does the difficult breathing come and go, or has it been constant since it started?"      Getting worse 4. SEVERITY: "How bad is your breathing?" (e.g., mild, moderate, severe)    - MILD: No SOB at rest, mild SOB with walking, speaks normally in sentences, can lay down, no retractions, pulse < 100.    - MODERATE: SOB at rest, SOB with minimal exertion and prefers to sit, cannot lie down flat, speaks in phrases, mild retractions, audible wheezing, pulse 100-120.    - SEVERE: Very SOB at rest, speaks in single words, struggling to breathe, sitting hunched forward, retractions, pulse > 120      At rest 5. RECURRENT SYMPTOM: "Have you had difficulty breathing before?" If so, ask: "When was the last time?" and "What happened that time?"      Ongoing for 2 weeks 6. CARDIAC HISTORY: "Do you have any history of heart disease?" (e.g., heart attack, angina, bypass surgery, angioplasty)     none 7. LUNG HISTORY: "Do you have any history of lung disease?"  (e.g., pulmonary embolus, asthma, emphysema)    Copd 8. CAUSE: "What do you think is causing the breathing problem?"      pneumonis 9. OTHER SYMPTOMS: "Do you have any  other symptoms? (e.g., dizziness, runny nose, cough, chest pain, fever)     Cough vomiting 10. PREGNANCY: "Is there any chance you are pregnant?" "When was your last menstrual period?"      N/A 11. TRAVEL: "Have you traveled out of the country in the last month?" (e.g., travel history, exposures)  Protocols used: BREATHING DIFFICULTY-A-AH

## 2019-06-15 NOTE — ED Notes (Signed)
Patient ambulated to room with a steady gait. Patient was lying supine on the benches in the waiting room. Patient states he takes four Vicodin a day for chronic pain from arthritis and didn't bring any with him.

## 2019-06-15 NOTE — ED Provider Notes (Signed)
[facility] Emergency Department Provider Note  ____________________________________________   First MD Initiated Contact with Patient 06/15/19 1403     (approximate)  I have reviewed the triage vital signs and the nursing notes.   HISTORY  Chief Complaint Shortness of Breath and Emesis   HPI Luke Briggs. is a 58 y.o. male presents to the ED with complaint of difficulty breathing for 2-1/2 weeks.  Patient states that he was tested for COVID 2 weeks ago and never heard from the test.  He continues to smoke 1 pack cigarettes per day.  He denies any fever or chills.  He is unaware of any COVID exposure.  He states that each morning he vomits because he is coughing so hard.  Vomitus is mostly consistent with mucus.  Patient states that he is taking all his medications as directed by his PCP.  He states that the vomiting is every day for the last year which is nothing unusual.     Past Medical History:  Diagnosis Date  . Arthritis   . COPD (chronic obstructive pulmonary disease) ([place])   . Hypertension   . Prostate disorder     Patient Active Problem List   Diagnosis Date Noted  . Controlled substance agreement signed 05/05/2019  . Chronic pain syndrome 04/28/2019  . BPH (benign prostatic hyperplasia) 04/28/2019  . Eczema 04/28/2019  . Essential hypertension 04/28/2019  . GERD (gastroesophageal reflux disease) 04/28/2019  . Schizophrenia ([place]) 04/28/2019  . Chronic post-traumatic stress disorder (PTSD) 04/28/2019  . Hemorrhoids 04/28/2019  . Nicotine dependence, cigarettes, w unsp disorders 04/28/2019  . History of alcohol dependence ([place]) 04/28/2019    Past Surgical History:  Procedure Laterality Date  . EYE SURGERY Left     Prior to Admission medications   Medication Sig Start Date End Date Taking? Authorizing Provider  albuterol (VENTOLIN HFA) 108 (90 Base) MCG/ACT inhaler Inhale 2 puffs into the lungs every 6 (six) hours as needed for  wheezing or shortness of breath. 05/31/19   Duffy Bruce, MD  buPROPion (WELLBUTRIN XL) 150 MG 24 hr tablet Take 1 tablet (150 mg total) by mouth daily. 04/28/19   [name], [name], NP  cloNIDine (CATAPRES) 0.1 MG tablet Take 1 tablet (0.1 mg total) by mouth 2 (two) times daily. 04/28/19   [name], [name], NP  finasteride (PROSCAR) 5 MG tablet Take 1 tablet (5 mg total) by mouth daily. 04/28/19   [name], [name], NP  hydrocortisone (ANUSOL-HC) 2.5 % rectal cream Place 1 application rectally 2 (two) times daily. 04/28/19   [name], [name], NP  Ipratropium-Albuterol (COMBIVENT) 20-100 MCG/ACT AERS respimat Inhale 1 puff into the lungs every 6 (six) hours. 06/15/19   Johnn Hai, PA-C  lisinopril (ZESTRIL) 10 MG tablet Take 1 tablet (10 mg total) by mouth daily. 05/05/19   [name], [name] T, NP  montelukast (SINGULAIR) 10 MG tablet Take 1 tablet (10 mg total) by mouth daily. 06/15/19 06/14/20  Johnn Hai, PA-C  omeprazole (PRILOSEC) 40 MG capsule Take 1 capsule (40 mg total) by mouth daily. 04/28/19   [name], [name], NP  oxybutynin (DITROPAN) 5 MG tablet Take 1 tablet (5 mg total) by mouth 2 (two) times daily. 04/28/19   [name], [name], NP  promethazine-dextromethorphan (PROMETHAZINE-DM) 6.25-15 MG/5ML syrup Take 5 mLs by mouth 3 (three) times daily as needed for cough. 05/31/19   Duffy Bruce, MD  tamsulosin (FLOMAX) 0.4 MG CAPS capsule Take 1 capsule (0.4 mg total) by mouth  daily. 04/28/19   [name], [name], NP  tiotropium (SPIRIVA HANDIHALER) 18 MCG inhalation capsule Place 1 capsule (18 mcg total) into inhaler and inhale daily. 04/28/19   [name], [name], NP  triamcinolone cream (KENALOG) 0.1 % Apply 1 application topically 2 (two) times daily. 04/28/19   Marnee Guarneri T, NP    Allergies Penicillins  Family History  Problem Relation Age of Onset  . Heart disease Mother   . Osteoporosis Mother   . Heart disease Father   . Emphysema Father   . Heart disease Sister   .  Emphysema Maternal Grandmother   . Heart disease Maternal Grandfather   . Osteoporosis Sister     Social History Social History   Tobacco Use  . Smoking status: Current Every Day Smoker    Packs/day: 1.00  . Smokeless tobacco: Never Used  Substance Use Topics  . Alcohol use: Yes    Comment: beer on occasion  . Drug use: Never    Review of Systems  Constitutional: No fever/chills Eyes: No visual changes. ENT: No sore throat. Cardiovascular: Denies chest pain. Respiratory: Positive shortness of breath.  Positive cough, positive smoking.  Positive history of COPD.  Positive posttussive vomiting. Gastrointestinal: No abdominal pain.  No nausea, no vomiting.  No diarrhea.   Musculoskeletal: Negative for back pain. Skin: Negative for rash. Neurological: Negative for headaches, focal weakness or numbness. Psychiatric:  Positive schizophrenia. ____________________________________________   PHYSICAL EXAM:  VITAL SIGNS: ED Triage Vitals  Enc Vitals Group     BP 06/15/19 1245 123/84     Pulse Rate 06/15/19 1245 85     Resp 06/15/19 1245 18     Temp 06/15/19 1245 97.8 F (36.6 C)     Temp Source 06/15/19 1245 Oral     SpO2 06/15/19 1245 98 %     Weight 06/15/19 1246 170 lb (77.1 kg)     Height 06/15/19 1246 5\' 10"  (1.778 m)     Head Circumference --      Peak Flow --      Pain Score 06/15/19 1248 10     Pain Loc --      Pain Edu? --      Excl. in [place]? --    Constitutional: Alert and oriented. Well appearing and in no acute distress. Eyes: Conjunctivae are normal.  Head: Atraumatic. Nose: No congestion/rhinnorhea. Neck: No stridor.   Hematological/Lymphatic/Immunilogical: No cervical lymphadenopathy. Cardiovascular: Normal rate, regular rhythm. Grossly normal heart sounds.  Good peripheral circulation. Respiratory: Normal respiratory effort.  No retractions. Lungs no rales, mild bilateral expiratory wheeze present.  Patient is able to talk in complete sentences without  difficulty. Gastrointestinal: Soft and nontender. No distention. Musculoskeletal: Moves upper and lower extremities without any difficulty.  Patient is noted to be walking in the hallway without any difficulties. Neurologic:  Normal speech and language. No gross focal neurologic deficits are appreciated. No gait instability. Skin:  Skin is warm, dry and intact. No rash noted. Psychiatric: Mood and affect are normal. Speech and behavior are normal.  ____________________________________________   LABS (all labs ordered are listed, but only abnormal results are displayed)  Labs Reviewed - No data to display  RADIOLOGY  Official radiology report(s): Dg Chest 2 View  Result Date: 06/15/2019 CLINICAL DATA:  Cough, vomiting EXAM: CHEST - 2 VIEW COMPARISON:  05/31/2019 FINDINGS: The heart size and mediastinal contours are within normal limits. Both lungs are clear. Nipple shadows project over the bilateral mid lungs. The visualized skeletal structures  are unremarkable. IMPRESSION: No acute abnormality of the lungs. No radiographic findings to explain cough. Consider CT to further evaluate otherwise unexplained persistent cough. Electronically Signed   By: Eddie Candle M.D.   On: 06/15/2019 13:25    ____________________________________________   PROCEDURES  Procedure(s) performed (including Critical Care):  Procedures   ____________________________________________   INITIAL IMPRESSION / ASSESSMENT AND PLAN / ED COURSE  As part of my medical decision making, I reviewed the following data within the electronic MEDICAL RECORD NUMBER Notes from prior ED visits and [place] Controlled Substance Database  Patient presents to the ED with complaint of difficulty breathing for 2-1/2 weeks.  He states that he coughs each morning for the last year until he vomits.  He was tested 2 weeks ago for COVID which was reported as negative.  He states that his PCP will not see him because of his respiratory complaint  now due to [place].  Patient is unaware of any fever or chills.  He continues to smoke at least 1/2 pack cigarettes per day which is down from his usual 1 pack/day.  He continues to take his scheduled medication and states that he completed the prednisone that was prescribed for him on his last visit.  Physical exam is unremarkable with the exception of some mild wheezes heard bilaterally.  Patient O2 sat was 98% and patient was afebrile while in the ED.  Patient is to continue with his regular medication but also Singulair 10 mg 1 daily and Combivent inhaler was prescribed.  We discussed his discontinuing smoking however it is unlikely that he will do this.  He is to follow-up with his PCP as soon as possible.  He was given return precautions should he develop any sudden shortness of breath or difficulty breathing.  We also discussed staying indoors when the humidity is high which would make his COPD more difficult for him to breathe.  ____________________________________________   FINAL CLINICAL IMPRESSION(S) / ED DIAGNOSES  Final diagnoses:  COPD exacerbation ([place])  Cigarette smoker     ED Discharge Orders         Ordered    montelukast (SINGULAIR) 10 MG tablet  Daily     06/15/19 1454    Ipratropium-Albuterol (COMBIVENT) 20-100 MCG/ACT AERS respimat  Every 6 hours     06/15/19 1454           Note:  This document was prepared using Dragon voice recognition software and may include unintentional dictation errors.    Johnn Hai, PA-C 06/15/19 1510    Harvest Dark, MD 06/16/19 1300

## 2019-06-15 NOTE — Discharge Instructions (Signed)
Continue regular medication as prescribed by your doctor.  The Combivent inhaler has 2 medications to help with your breathing.  Use this medication first and save your albuterol inhaler for times that you are short of breath in between.  Also begin taking Singulair 10 mg daily.  Increase fluids.  Decrease or discontinue smoking as this will help your breathing greatly.  Chest x-ray today did not show any pneumonia.  Your oxygen level was 98%.  On days that the humidity is high you should stay indoors.  Call your doctor this week and make an appointment.  Let them know that your COVID test was negative.  Return to the emergency department if any severe worsening of your symptoms or difficulty breathing.

## 2019-06-15 NOTE — Telephone Encounter (Signed)
Noted and agree with plan of care for ER visit.  Will follow-up.

## 2019-06-15 NOTE — ED Triage Notes (Addendum)
Says he cant breath for 2.5 weeks.  Says he vomits every day for a year.  Says he coughs so hard each am, he vomits.   He came in 2 weeks ago and was tested afor covid and he is not any better. His pcp will not see him for resp compliants right now.

## 2019-06-17 ENCOUNTER — Ambulatory Visit (INDEPENDENT_AMBULATORY_CARE_PROVIDER_SITE_OTHER): Payer: Medicaid Other | Admitting: Nurse Practitioner

## 2019-06-17 ENCOUNTER — Other Ambulatory Visit: Payer: Self-pay

## 2019-06-17 ENCOUNTER — Encounter: Payer: Self-pay | Admitting: Nurse Practitioner

## 2019-06-17 DIAGNOSIS — I10 Essential (primary) hypertension: Secondary | ICD-10-CM | POA: Diagnosis not present

## 2019-06-17 DIAGNOSIS — F17219 Nicotine dependence, cigarettes, with unspecified nicotine-induced disorders: Secondary | ICD-10-CM | POA: Diagnosis not present

## 2019-06-17 DIAGNOSIS — J449 Chronic obstructive pulmonary disease, unspecified: Secondary | ICD-10-CM

## 2019-06-17 DIAGNOSIS — G894 Chronic pain syndrome: Secondary | ICD-10-CM | POA: Diagnosis not present

## 2019-06-17 DIAGNOSIS — J438 Other emphysema: Secondary | ICD-10-CM | POA: Insufficient documentation

## 2019-06-17 MED ORDER — HYDROCODONE-ACETAMINOPHEN 5-325 MG PO TABS: 1.0000 | ORAL_TABLET | Freq: Four times a day (QID) | ORAL | 0 refills | Status: AC | PRN

## 2019-06-17 NOTE — Assessment & Plan Note (Signed)
Chronic, ongoing with recent exacerbation (improved) in setting of long term smoking. Continue current inhaler regimen and Singulair.  Consider change to LABA/LAMA combo, such as Stiolto, at upcoming appointment.  Obtain spirometry and discuss low dose CT scan for CA screening.  May require CCM referral for assistance with inhaler costs, will discuss with him at upcoming visit.  Return in 3 weeks.

## 2019-06-17 NOTE — Progress Notes (Signed)
There were no vitals taken for this visit.   Subjective:    Patient ID: Luke Briggs., male    DOB: 1961-05-06, 58 y.o.   MRN: [id number]  HPI: Luke Briggs. is a 58 y.o. male  Chief Complaint  Patient presents with  . Hypertension  . Pain    . This visit was completed via telephone due to the restrictions of the COVID-19 pandemic. All issues as above were discussed and addressed but no physical exam was performed. If it was felt that the patient should be evaluated in the office, they were directed there. The patient verbally consented to this visit. Patient was unable to complete an audio/visual visit due to Lack of equipment. Due to the catastrophic nature of the COVID-19 pandemic, this visit was done through audio contact only. . Location of the patient: home . Location of the provider: home . Those involved with this call:  . Provider: Marnee Guarneri, DNP . CMA: [name], CMA . Front Desk/Registration: [name]  . Time spent on call: 25 minutes on the phone discussing health concerns. 10 minutes total spent in review of patient's record and preparation of their chart.  . I verified patient identity using two factors (patient name and date of birth). Patient consents verbally to being seen via telemedicine visit today.    ER FOLLOW UP Seen in ER x 2 05/31/2019 and 06/15/2019 for SOB, diagnosed with COPD exacerbation.  Was given Combivent to take Q6H and Singulair + had a Prednisone taper.  CXR noted no acute abnormality.  Covid testing negative. Time since discharge: 06/15/2019 Hospital/facility: [facility] Diagnosis: COPD exacerbation Procedures/tests: CXR and labs Consultants: none New medications: Singulair and Combivent Discharge instructions:  Follow-up with PCP Status: better , reports major improvement in cough and SOB with new medications  COPD Continues to smoke 1 PPD and is not interested in quitting.  Restarted on Spiriva and Albuterol on  04/28/2019, current new medications provided in ER.  Discussed with patient need for spirometry testing, which he agrees to and benefit of changing regimen to a maintenance LAMA/LABA like Stiolto in future. COPD status: stable, improvement reported in current exacerbation symptoms Satisfied with current treatment?: yes Oxygen use: no Dyspnea frequency:  Cough frequency:  Rescue inhaler frequency:   Limitation of activity: no Productive cough:  Last Spirometry:  Pneumovax: Not up to Date Influenza: Up to Date  HYPERTENSION Continues on Lisinopril and Clonidine.  Reports BP readings are doing better Hypertension status: stable  Satisfied with current treatment? yes Duration of hypertension: chronic BP monitoring frequency:  daily BP range: 140/82 this morning BP medication side effects:  no Medication compliance: good compliance Aspirin: no Recurrent headaches: no Visual changes: no Palpitations: no Dyspnea: no Chest pain: no Lower extremity edema: no Dizzy/lightheaded: no   CHRONIC PAIN: On Norco for two years, currently takes this four times a day. Has arthritis in neck, knees, hands, lower back. States he has deterioration ofdiscsin neck.  [facility] Christ[facility].Urine drug screen negative on 04/28/2019, had been without pain meds for 3 weeks. He is very accepting of UDS and monitoring as did this with previous doctor.Reports pain at baseline is10/10 at worst and 7/10 at best to knees, neck, hands,neck. Has had injections in both knees, steroidwhich helped for 1-2 weeks. No history epidural injections. Took Gabapentin at one time, but no longer takes as it made sick to stomach. Is scheduled to see pain clinic locally,  Dr. Holley Raring, 07/01/2019.    Relevant past medical, surgical, family and social history reviewed and updated as indicated. Interim medical history since our last visit reviewed. Allergies and  medications reviewed and updated.  Review of Systems  Constitutional: Negative for activity change, diaphoresis, fatigue and fever.  Respiratory: Positive for cough (chronic, but reports improved). Negative for chest tightness, shortness of breath (reports improvement) and wheezing.   Cardiovascular: Negative for chest pain, palpitations and leg swelling.  Gastrointestinal: Negative for abdominal distention, abdominal pain, constipation, diarrhea, nausea and vomiting.  Musculoskeletal: Positive for arthralgias.  Skin: Negative.   Neurological: Negative for dizziness, syncope, weakness, light-headedness, numbness and headaches.  Psychiatric/Behavioral: Negative.     Per HPI unless specifically indicated above     Objective:    There were no vitals taken for this visit.  Wt Readings from Last 3 Encounters:  06/15/19 170 lb (77.1 kg)  05/31/19 175 lb (79.4 kg)    Physical Exam   Unable to perform due to patient with lack of equipment, telephone only.  Talkative without SOB noted.   Results for orders placed or performed during the hospital encounter of 05/31/19  Novel Coronavirus,NAA,(SEND-OUT TO REF LAB - TAT 24-48 hrs); Hosp Order   Specimen: Nasopharyngeal Swab; Respiratory  Result Value Ref Range   SARS-CoV-2, NAA NOT DETECTED NOT DETECTED   Coronavirus Source NASOPHARYNGEAL   Basic metabolic panel  Result Value Ref Range   Sodium 133 (L) 135 - 145 mmol/L   Potassium 4.5 3.5 - 5.1 mmol/L   Chloride 99 98 - 111 mmol/L   CO2 25 22 - 32 mmol/L   Glucose, Bld 102 (H) 70 - 99 mg/dL   BUN 10 6 - 20 mg/dL   Creatinine, Ser 0.85 0.61 - 1.24 mg/dL   Calcium 8.8 (L) 8.9 - 10.3 mg/dL   GFR calc non Af Amer >60 >60 mL/min   GFR calc Af Amer >60 >60 mL/min   Anion gap 9 5 - 15  CBC  Result Value Ref Range   WBC 9.2 4.0 - 10.5 K/uL   RBC 5.18 4.22 - 5.81 MIL/uL   Hemoglobin 16.0 13.0 - 17.0 g/dL   HCT 46.5 39.0 - 52.0 %   MCV 89.8 80.0 - 100.0 fL   MCH 30.9 26.0 - 34.0 pg    MCHC 34.4 30.0 - 36.0 g/dL   RDW 13.6 11.5 - 15.5 %   Platelets 297 150 - 400 K/uL   nRBC 0.0 0.0 - 0.2 %  Troponin I (High Sensitivity)  Result Value Ref Range   Troponin I (High Sensitivity) 8 <18 ng/L  Troponin I (High Sensitivity)  Result Value Ref Range   Troponin I (High Sensitivity) 6 <18 ng/L      Assessment & Plan:   Problem List Items Addressed This Visit      Cardiovascular and Mediastinum   Essential hypertension    Chronic, ongoing.  BP improving, but remains above goal.  Continue current medication regimen and continue daily BP checks at home.  Consider change from Lisinopril to Losartan at next visit due to underlying COPD.  Return in 3 weeks.        Respiratory   COPD, mild (HCC) - Primary    Chronic, ongoing with recent exacerbation (improved) in setting of long term smoking. Continue current inhaler regimen and Singulair.  Consider change to LABA/LAMA combo, such as Stiolto, at upcoming appointment.  Obtain spirometry and discuss low dose CT scan for CA screening.  May require  CCM referral for assistance with inhaler costs, will discuss with him at upcoming visit.  Return in 3 weeks.        Nervous and Auditory   Nicotine dependence, cigarettes, w unsp disorders    I have recommended complete cessation of tobacco use. I have discussed various options available for assistance with tobacco cessation including over the counter methods (Nicotine gum, patch and lozenges). We also discussed prescription options (Chantix, Nicotine Inhaler / Nasal Spray). The patient is not interested in pursuing any prescription tobacco cessation options at this time.        Other   Chronic pain syndrome    Chronic, ongoing.  Has pain clinic initial appointment coming up and is aware for chronic pain management will need to ensure pain clinic visits are attended.  Continue current medication regimen, next refill due 06/19/2019.  Sent script to be filled on that day for 30 day supply, no  refills.         Relevant Medications   HYDROcodone-acetaminophen (NORCO/VICODIN) 5-325 MG tablet (Start on 06/19/2019)      I discussed the assessment and treatment plan with the patient. The patient was provided an opportunity to ask questions and all were answered. The patient agreed with the plan and demonstrated an understanding of the instructions.   The patient was advised to call back or seek an in-person evaluation if the symptoms worsen or if the condition fails to improve as anticipated.   I provided 21+ minutes of time during this encounter.  Follow up plan: Return in about 3 weeks (around 07/08/2019) for COPD in office for spirometry + HTN.

## 2019-06-17 NOTE — Patient Instructions (Signed)
COPD and Physical Activity °Chronic obstructive pulmonary disease (COPD) is a long-term (chronic) condition that affects the lungs. COPD is a general term that can be used to describe many different lung problems that cause lung swelling (inflammation) and limit airflow, including chronic bronchitis and emphysema. °The main symptom of COPD is shortness of breath, which makes it harder to do even simple tasks. This can also make it harder to exercise and be active. Talk with your health care provider about treatments to help you breathe better and actions you can take to prevent breathing problems during physical activity. °What are the benefits of exercising with COPD? °Exercising regularly is an important part of a healthy lifestyle. You can still exercise and do physical activities even though you have COPD. Exercise and physical activity improve your shortness of breath by increasing blood flow (circulation). This causes your heart to pump more oxygen through your body. Moderate exercise can improve your: °· Oxygen use. °· Energy level. °· Shortness of breath. °· Strength in your breathing muscles. °· Heart health. °· Sleep. °· Self-esteem and feelings of self-worth. °· Depression, stress, and anxiety levels. °Exercise can benefit everyone with COPD. The severity of your disease may affect how hard you can exercise, especially at first, but everyone can benefit. Talk with your health care provider about how much exercise is safe for you, and which activities and exercises are safe for you. °What actions can I take to prevent breathing problems during physical activity? °· Sign up for a pulmonary rehabilitation program. This type of program may include: °? Education about lung diseases. °? Exercise classes that teach you how to exercise and be more active while improving your breathing. This usually involves: °§ Exercise using your lower extremities, such as a stationary bicycle. °§ About 30 minutes of exercise, 2  to 5 times per week, for 6 to 12 weeks °§ Strength training, such as push ups or leg lifts. °? Nutrition education. °? Group classes in which you can talk with others who also have COPD and learn ways to manage stress. °· If you use an oxygen tank, you should use it while you exercise. Work with your health care provider to adjust your oxygen for your physical activity. Your resting flow rate is different from your flow rate during physical activity. °· While you are exercising: °? Take slow breaths. °? Pace yourself and do not try to go too fast. °? Purse your lips while breathing out. Pursing your lips is similar to a kissing or whistling position. °? If doing exercise that uses a quick burst of effort, such as weight lifting: °§ Breathe in before starting the exercise. °§ Breathe out during the hardest part of the exercise (such as raising the weights). °Where to find support °You can find support for exercising with COPD from: °· Your health care provider. °· A pulmonary rehabilitation program. °· Your local health department or community health programs. °· Support groups, online or in-person. Your health care provider may be able to recommend support groups. °Where to find more information °You can find more information about exercising with COPD from: °· [employer] Association: lung.org. °· COPD Foundation: copdfoundation.org. °Contact a health care provider if: °· Your symptoms get worse. °· You have chest pain. °· You have nausea. °· You have a fever. °· You have trouble talking or catching your breath. °· You want to start a new exercise program or a new activity. °Summary °· COPD is a general term that can   be used to describe many different lung problems that cause lung swelling (inflammation) and limit airflow. This includes chronic bronchitis and emphysema. °· Exercise and physical activity improve your shortness of breath by increasing blood flow (circulation). This causes your heart to provide more  oxygen to your body. °· Contact your health care provider before starting any exercise program or new activity. Ask your health care provider what exercises and activities are safe for you. °This information is not intended to replace advice given to you by your health care provider. Make sure you discuss any questions you have with your health care provider. °Document Released: 11/27/2017 Document Revised: 02/24/2019 Document Reviewed: 11/27/2017 °[employer]vier Patient Education © 2020 [employer] ° °

## 2019-06-17 NOTE — Assessment & Plan Note (Signed)
Chronic, ongoing.  BP improving, but remains above goal.  Continue current medication regimen and continue daily BP checks at home.  Consider change from Lisinopril to Losartan at next visit due to underlying COPD.  Return in 3 weeks.

## 2019-06-17 NOTE — Assessment & Plan Note (Signed)
Chronic, ongoing.  Has pain clinic initial appointment coming up and is aware for chronic pain management will need to ensure pain clinic visits are attended.  Continue current medication regimen, next refill due 06/19/2019.  Sent script to be filled on that day for 30 day supply, no refills.

## 2019-06-17 NOTE — Assessment & Plan Note (Signed)
I have recommended complete cessation of tobacco use. I have discussed various options available for assistance with tobacco cessation including over the counter methods (Nicotine gum, patch and lozenges). We also discussed prescription options (Chantix, Nicotine Inhaler / Nasal Spray). The patient is not interested in pursuing any prescription tobacco cessation options at this time.  

## 2019-06-21 ENCOUNTER — Ambulatory Visit: Payer: Self-pay | Admitting: Nurse Practitioner

## 2019-06-30 NOTE — Progress Notes (Signed)
Patient's Name: Luke Briggs.  MRN: [id number]  Referring Provider: [name], NP  DOB: [birth year]  PCP: [name], NP  DOS: 07/01/2019  Note by: Gillis Santa, MD  Service setting: Ambulatory outpatient  Specialty: Interventional Pain Management  Location: [facility] (AMB) Pain Management Facility  Visit type: Initial Patient Evaluation  Patient type: New Patient   Primary Reason(s) for Visit: Encounter for initial evaluation of one or more chronic problems (new to examiner) potentially causing chronic pain, and posing a threat to normal musculoskeletal function. (Level of risk: High) CC: Knee Pain (bilateral) and Neck Pain  HPI  Luke Briggs is a 59 y.o. year old, male patient, who comes today to see [place] for the first time for an initial evaluation of his chronic pain. He has Chronic pain syndrome; BPH (benign prostatic hyperplasia); Eczema; Essential hypertension; GERD (gastroesophageal reflux disease); Schizophrenia ([place]); Chronic post-traumatic stress disorder (PTSD); Hemorrhoids; Nicotine dependence, cigarettes, w unsp disorders; History of alcohol dependence ([place]); Controlled substance agreement signed; COPD, mild ([place]); Bilateral primary osteoarthritis of knee; Primary osteoarthritis of right knee; Primary osteoarthritis of left knee; and Drug-seeking behavior on their problem list. Today he comes in for evaluation of his Knee Pain (bilateral) and Neck Pain  Pain Assessment: Location: Right, Left Knee Radiating: denies Onset: More than a month ago Duration: Chronic pain Quality: (pulling) Severity: 6 /10 (subjective, self-reported pain score)  Note: Clear symptom exaggeration. Reported level of pain is not compatible with clinical observations.  Effect on ADL: not able to work Timing: Constant Modifying factors: medications BP: (!) 167/100  HR: 65  Onset and Duration: Gradual Cause of pain: Unknown Severity: Getting worse, NAS-11 at its worse: 10/10, NAS-11 at its  best: 7/10, NAS-11 now: 7/10 and NAS-11 on the average: 7/10 Timing: After activity or exercise and After a period of immobility Aggravating Factors: Motion and Walking Alleviating Factors: Medications Associated Problems: Pain that does not allow patient to sleep Quality of Pain: Constant, Disabling, Distressing, Exhausting, Getting shorter, Uncomfortable and Work related Previous Examinations or Tests: The patient denies any exams or tests Previous Treatments: The patient denies did have steroid injectins in knees and shoulders  The patient comes into the clinics today for the first time for a chronic pain management evaluation.   58 year old male with a history of alcohol dependence, PTSD, schizophrenia, history of prior incarceration who presents complaining of bilateral knee pain and neck pain.  Patient presents demanding hydrocodone.  He was somewhat aggressive and unpleasant and started the conversation off ask "why do I need to see a specialist for these damn medications" I informed the patient about opioid prescribing guidelines and how our pain clinic works.  I also informed him that primary care providers in [place] are less willing to prescribe opioid analgesics especially with the current landscape of opioid misuse and abuse in the community.  Patient moved from [place] and states that he was receiving hydrocodone 10 mg 4 times a day as needed for his pain.  When asked about his pain patient was very short and did not elaborate.  I tried to explain to the patient evidence-based strategies for arthritis and also informed him how we will need to do a work-up to evaluate his pathology or get records from his primary care provider in [place].  Patient became rude and stood up attempting to leave where he proceeded to tell me "shove it up your ass"  This patient is not an appropriate patient for this pain medicine  clinic.  He is practicing drug-seeking behavior.  He would not even let me  present a comprehensive pain management plan to him which could have included opioid analgesics so long as he completed the necessary work-up which usually includes psychiatric assessment as well as urine drug screen however the patient's behavior that he demonstrated in clinic illustrates a dysfunctional emotional process for pain management coping strategies.   I do not recommend opioid analgesics for this patient given behavior that he demonstrated in clinic, unclear pain etiology, lack of opioid narcotics being beneficial primary for arthritic pain, prior history of incarceration, PTSD, schizophrenia, history of alcohol abuse which the patient denied however in his PCP note, patient endorsed drinking 4-6 beers a night before.    Historic Controlled Substance Pharmacotherapy Review  Hydrocodone 10 mg 4 times daily as needed in [place] by PCP  Pharmacodynamics: Desired effects: Analgesia: The patient reports 50% benefit. Reported improvement in function: The patient reports medication allows him to accomplish basic ADLs. Clinically meaningful improvement in function (CMIF): Sustained CMIF goals met Perceived effectiveness: Described as relatively effective, allowing for increase in activities of daily living (ADL) Undesirable effects: Side-effects or Adverse reactions: None reported Historical Monitoring: The patient  reports no history of drug use. List of all UDS Test(s): Lab Results  Component Value Date   COCAINSCRNUR Negative 04/28/2019   PCPQUANT Negative 04/28/2019   CANNABQUANT Negative 04/28/2019   List of other Serum/Urine Drug Screening Test(s):  Lab Results  Component Value Date   COCAINSCRNUR Negative 04/28/2019   PCPQUANT Negative 04/28/2019   CANNABQUANT Negative 04/28/2019   Historical Background Evaluation: [place] PMP: PDMP reviewed during this encounter. Six (6) year initial data search conducted.             [place] Department of public safety, offender search: Editor, commissioning  Information) Non-contributory Risk Assessment Profile: Aberrant behavior: aggressive complaining about need for higher doses or stronger medication, claims that "nothing else works", diminished ability to recognize a problem with one's behavior or use of the medication, drug seeking behavior, dysfunctional emotional process, extensive time discussing medicaiton and resistance to changing therapy Risk factors for fatal opioid overdose: history of alcoholism, liver disease and schizophrenia Fatal overdose hazard ratio (HR): Calculation deferred Non-fatal overdose hazard ratio (HR): Calculation deferred Risk of opioid abuse or dependence: 0.7-3.0% with doses ? 36 MME/day and 6.1-26% with doses ? 120 MME/day. Substance use disorder (SUD) risk level: High Personal History of Substance Abuse (SUD-Substance use disorder):  Alcohol: Positive Male or Male  Illegal Drugs: Negative  Rx Drugs: Negative  ORT Risk Level calculation: High Risk Opioid Risk Tool - 07/01/19 1212      Family History of Substance Abuse   Alcohol  Positive Male    Illegal Drugs  Negative    Rx Drugs  Negative      Personal History of Substance Abuse   Alcohol  Positive Male or Male    Illegal Drugs  Negative    Rx Drugs  Negative      Age   Age between 75-45 years   No      History of Preadolescent Sexual Abuse   History of Preadolescent Sexual Abuse  Negative or Male      Psychological Disease   Psychological Disease  Positive    OCD  Negative    Bipolar  Positive    Schizophrenia  Positive    Depression  Negative      Total Score   Opioid Risk Tool Scoring  8  Opioid Risk Interpretation  High Risk      ORT Scoring interpretation table:  Score <3 = Low Risk for SUD  Score between 4-7 = Moderate Risk for SUD  Score >8 = High Risk for Opioid Abuse   PHQ-2 Depression Scale:  Total score: 0  PHQ-2 Scoring interpretation table: (Score and probability of major depressive disorder)  Score 0 = No  depression  Score 1 = 15.4% Probability  Score 2 = 21.1% Probability  Score 3 = 38.4% Probability  Score 4 = 45.5% Probability  Score 5 = 56.4% Probability  Score 6 = 78.6% Probability   PHQ-9 Depression Scale:  Total score: 0  PHQ-9 Scoring interpretation table:  Score 0-4 = No depression  Score 5-9 = Mild depression  Score 10-14 = Moderate depression  Score 15-19 = Moderately severe depression  Score 20-27 = Severe depression (2.4 times higher risk of SUD and 2.89 times higher risk of overuse)   Pharmacologic Plan: Non-opioid analgesic therapy offered.            Initial impression: High risk for opiate therapy.  Meds   Current Outpatient Medications:  .  albuterol (VENTOLIN HFA) 108 (90 Base) MCG/ACT inhaler, Inhale 2 puffs into the lungs every 6 (six) hours as needed for wheezing or shortness of breath., Disp: 6.7 g, Rfl: 0 .  buPROPion (WELLBUTRIN XL) 150 MG 24 hr tablet, Take 1 tablet (150 mg total) by mouth daily., Disp: 90 tablet, Rfl: 2 .  cloNIDine (CATAPRES) 0.1 MG tablet, Take 1 tablet (0.1 mg total) by mouth 2 (two) times daily., Disp: 180 tablet, Rfl: 2 .  finasteride (PROSCAR) 5 MG tablet, Take 1 tablet (5 mg total) by mouth daily., Disp: 90 tablet, Rfl: 2 .  HYDROcodone-acetaminophen (NORCO/VICODIN) 5-325 MG tablet, Take 1 tablet by mouth every 6 (six) hours as needed for moderate pain., Disp: 120 tablet, Rfl: 0 .  hydrocortisone (ANUSOL-HC) 2.5 % rectal cream, Place 1 application rectally 2 (two) times daily., Disp: 30 g, Rfl: 0 .  Ipratropium-Albuterol (COMBIVENT) 20-100 MCG/ACT AERS respimat, Inhale 1 puff into the lungs every 6 (six) hours., Disp: 4 g, Rfl: 1 .  lisinopril (ZESTRIL) 10 MG tablet, Take 1 tablet (10 mg total) by mouth daily., Disp: 90 tablet, Rfl: 3 .  montelukast (SINGULAIR) 10 MG tablet, Take 1 tablet (10 mg total) by mouth daily., Disp: 30 tablet, Rfl: 0 .  omeprazole (PRILOSEC) 40 MG capsule, Take 1 capsule (40 mg total) by mouth daily., Disp: 90  capsule, Rfl: 2 .  tamsulosin (FLOMAX) 0.4 MG CAPS capsule, Take 1 capsule (0.4 mg total) by mouth daily., Disp: 90 capsule, Rfl: 2 .  tiotropium (SPIRIVA HANDIHALER) 18 MCG inhalation capsule, Place 1 capsule (18 mcg total) into inhaler and inhale daily., Disp: 30 capsule, Rfl: 12 .  triamcinolone cream (KENALOG) 0.1 %, Apply 1 application topically 2 (two) times daily., Disp: 30 g, Rfl: 0 .  oxybutynin (DITROPAN) 5 MG tablet, Take 1 tablet (5 mg total) by mouth 2 (two) times daily. (Patient not taking: Reported on 07/01/2019), Disp: 180 tablet, Rfl: 2    ROS  Cardiovascular: High blood pressure Pulmonary or Respiratory: Difficulty blowing air out (Emphysema) and Shortness of breath Neurological: No reported neurological signs or symptoms such as seizures, abnormal skin sensations, urinary and/or fecal incontinence, being born with an abnormal open spine and/or a tethered spinal cord Review of Past Neurological Studies: No results found for this or any previous visit. Psychological-Psychiatric: Psychiatric disorder Gastrointestinal: Reflux or  heatburn Genitourinary: No reported renal or genitourinary signs or symptoms such as difficulty voiding or producing urine, peeing blood, non-functioning kidney, kidney stones, difficulty emptying the bladder, difficulty controlling the flow of urine, or chronic kidney disease Hematological: No reported hematological signs or symptoms such as prolonged bleeding, low or poor functioning platelets, bruising or bleeding easily, hereditary bleeding problems, low energy levels due to low hemoglobin or being anemic Endocrine: 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  Laboratory Chemistry Profile   Screening Lab Results  Component Value Date   SARSCOV2NAA NOT DETECTED 05/31/2019   COVIDSOURCE NASOPHARYNGEAL 05/31/2019   HIV Non Reactive 05/05/2019    Renal Lab Results  Component Value Date   BUN 10 05/31/2019   CREATININE 0.85 05/31/2019   BCR 9 05/05/2019   GFRAA >60 05/31/2019   GFRNONAA >60 05/31/2019                             Hepatic Lab Results  Component Value Date   AST 27 05/05/2019   ALT 25 05/05/2019   ALBUMIN 3.8 05/05/2019   ALKPHOS 82 05/05/2019                        Electrolytes Lab Results  Component Value Date   NA 133 (L) 05/31/2019   K 4.5 05/31/2019   CL 99 05/31/2019   CALCIUM 8.8 (L) 05/31/2019                        Neuropathy Lab Results  Component Value Date   HIV Non Reactive 05/05/2019                        Coagulation Lab Results  Component Value Date   PLT 297 05/31/2019                        Cardiovascular Lab Results  Component Value Date   HGB 16.0 05/31/2019   HCT 46.5 05/31/2019                         ID Lab Results  Component Value Date   HIV Non Reactive 05/05/2019   SARSCOV2NAA NOT DETECTED 05/31/2019    Cancer No results found for: CEA, CA125, LABCA2                      Endocrine Lab Results  Component Value Date   TSH 1.090 05/05/2019                        Note: Lab results reviewed.  PFSH  Drug: Luke Briggs  reports no history of drug use. Alcohol:  reports previous alcohol use. Tobacco:  reports that he has been smoking. He has been smoking about 0.50 packs per day. He has never used smokeless tobacco. Medical:  has a past medical history of Arthritis, COPD (chronic obstructive pulmonary disease) ([place]), Hypertension, and Prostate disorder. Family: family history includes Emphysema in his father and maternal grandmother; Heart disease in his father, maternal grandfather, mother, and sister; Osteoporosis  in his mother and sister.  Past  Surgical History:  Procedure Laterality Date  . EYE SURGERY Left    Active Ambulatory Problems    Diagnosis Date Noted  . Chronic pain syndrome 04/28/2019  . BPH (benign prostatic hyperplasia) 04/28/2019  . Eczema 04/28/2019  . Essential hypertension 04/28/2019  . GERD (gastroesophageal reflux disease) 04/28/2019  . Schizophrenia ([place]) 04/28/2019  . Chronic post-traumatic stress disorder (PTSD) 04/28/2019  . Hemorrhoids 04/28/2019  . Nicotine dependence, cigarettes, w unsp disorders 04/28/2019  . History of alcohol dependence ([place]) 04/28/2019  . Controlled substance agreement signed 05/05/2019  . COPD, mild ([place]) 06/17/2019  . Bilateral primary osteoarthritis of knee 07/01/2019  . Primary osteoarthritis of right knee 07/01/2019  . Primary osteoarthritis of left knee 07/01/2019  . Drug-seeking behavior 07/01/2019   Resolved Ambulatory Problems    Diagnosis Date Noted  . No Resolved Ambulatory Problems   Past Medical History:  Diagnosis Date  . Arthritis   . COPD (chronic obstructive pulmonary disease) ([place])   . Hypertension   . Prostate disorder    Constitutional Exam  General appearance: alert, controversial and demanding Vitals:   07/01/19 1208  BP: (!) 167/100  Pulse: 65  Resp: 18  Temp: 98.6 F (37 C)  TempSrc: Oral  SpO2: 99%  Weight: 170 lb (77.1 kg)  Height: _0  (1.778 m)   BMI Assessment: Estimated body mass index is 24.39 kg/m as calculated from the following:   Height as of this encounter: _1  (1.778 m).   Weight as of this encounter: 170 lb (77.1 kg).  BMI interpretation table: BMI level Category Range association with higher incidence of chronic pain  <18 kg/m2 Underweight   18.5-24.9 kg/m2 Ideal body weight   25-29.9 kg/m2 Overweight Increased incidence by 20%  30-34.9 kg/m2 Obese (Class I) Increased incidence by 68%  35-39.9 kg/m2 Severe obesity (Class II) Increased incidence by 136%  >40 kg/m2 Extreme obesity (Class III) Increased  incidence by 254%   Patient's current BMI Ideal Body weight  Body mass index is 24.39 kg/m. Ideal body weight: 73 kg (160 lb 15 oz) Adjusted ideal body weight: 74.6 kg (164 lb 9 oz)   BMI Readings from Last 4 Encounters:  07/01/19 24.39 kg/m  06/15/19 24.39 kg/m  05/31/19 25.11 kg/m   Wt Readings from Last 4 Encounters:  07/01/19 170 lb (77.1 kg)  06/15/19 170 lb (77.1 kg)  05/31/19 175 lb (79.4 kg)  Psych/Mental status: Alert, oriented x 3 (person, place, & time)       Eyes: PERLA Respiratory: No evidence of acute respiratory distress  Cervical Spine Area Exam  Skin & Axial Inspection: No masses, redness, edema, swelling, or associated skin lesions Alignment: Symmetrical Functional ROM: Unrestricted ROM      Stability: No instability detected Muscle Tone/Strength: Functionally intact. No obvious neuro-muscular anomalies detected. Sensory (Neurological): Musculoskeletal pain pattern  Thoracic Spine Area Exam  Skin & Axial Inspection: No masses, redness, or swelling Alignment: Symmetrical Functional ROM: Unrestricted ROM Stability: No instability detected Muscle Tone/Strength: Functionally intact. No obvious neuro-muscular anomalies detected. Sensory (Neurological): Unimpaired Muscle strength & Tone: No palpable anomalies  Lumbar Spine Area Exam  Skin & Axial Inspection: No masses, redness, or swelling Alignment: Symmetrical Functional ROM: Unrestricted ROM       Stability: No instability detected Muscle Tone/Strength: Functionally intact. No obvious neuro-muscular anomalies detected. Sensory (Neurological): Musculoskeletal pain pattern    Gait & Posture Assessment  Ambulation: Unassisted Gait: Relatively normal for age and body habitus  Posture: WNL   Lower Extremity Exam    Side: Right lower extremity  Side: Left lower extremity  Stability: No instability observed          Stability: No instability observed          Skin & Extremity Inspection: Skin color,  temperature, and hair growth are WNL. No peripheral edema or cyanosis. No masses, redness, swelling, asymmetry, or associated skin lesions. No contractures.  Skin & Extremity Inspection: Skin color, temperature, and hair growth are WNL. No peripheral edema or cyanosis. No masses, redness, swelling, asymmetry, or associated skin lesions. No contractures.  Functional ROM: Unrestricted ROM                  Functional ROM: Unrestricted ROM                  Muscle Tone/Strength: Functionally intact. No obvious neuro-muscular anomalies detected.  Muscle Tone/Strength: Functionally intact. No obvious neuro-muscular anomalies detected.  Sensory (Neurological): Arthropathic arthralgia        Sensory (Neurological): Arthropathic arthralgia        DTR: Patellar: deferred today Achilles: deferred today Plantar: deferred today  DTR: Patellar: deferred today Achilles: deferred today Plantar: deferred today       Assessment  Primary Diagnosis & Pertinent Problem List: The primary encounter diagnosis was Bilateral primary osteoarthritis of knee. Diagnoses of Primary osteoarthritis of right knee, Primary osteoarthritis of left knee, Chronic pain syndrome, Schizophrenia, unspecified type ([place]), Chronic post-traumatic stress disorder (PTSD), History of alcohol dependence ([place]), and Drug-seeking behavior were also pertinent to this visit.  Visit Diagnosis (New problems to examiner): 1. Bilateral primary osteoarthritis of knee   2. Primary osteoarthritis of right knee   3. Primary osteoarthritis of left knee   4. Chronic pain syndrome   5. Schizophrenia, unspecified type ([place])   6. Chronic post-traumatic stress disorder (PTSD)   7. History of alcohol dependence ([place])   8. Drug-seeking behavior    Plan of Care   Patient is high risk for opioid prescribing. The patient will not be a candidate for opioid therapy through this clinic. Patient will be optimized on non-opioid adjunctive analgesics and a  comprehensive pain management plan was developed which included diagnostic imaging studies in the form of x-rays of his knees as well as his neck region and discussion of steroid injection in his knees versus intra-articular Hyalgan injection as well as genicular nerve block.  I also offered to send the patient to physical therapy for his knee pain and neck pain.  Patient was not interested in any of these.  Please see HPI above.  Patient was very rude and walked out of the clinic once I informed him that he would not be receiving opioid analgesics today and that a work-up would be required to determine his suitability for chronic opioid therapy depending upon his pathology.  I also informed him that chronic opioid therapy is not effective long-term for arthritic type of pain it increases his risk of substance abuse and misuse as well as negative cardiorespiratory side effects in the context of his psychiatric history which includes schizophrenia, PTSD as well as substance abuse disorder with alcohol.  These are all signs at the patient is high risk for opioid prescribing and furthermore his behavior in clinic reinforces that.  Provider-requested follow-up: No follow-ups on file.  No future appointments.  Primary Care Physician: [name], NP Location: [facility] Note by: Gillis Santa, MD Date: 07/01/2019; Time:  2:43 PM  Note: This dictation was prepared with Dragon dictation. Any transcriptional errors that may result from this process are unintentional.

## 2019-07-01 ENCOUNTER — Telehealth: Payer: Self-pay | Admitting: Nurse Practitioner

## 2019-07-01 ENCOUNTER — Other Ambulatory Visit: Payer: Self-pay

## 2019-07-01 ENCOUNTER — Encounter: Payer: Self-pay | Admitting: Student in an Organized Health Care Education/Training Program

## 2019-07-01 ENCOUNTER — Ambulatory Visit
Payer: Medicaid Other | Attending: Student in an Organized Health Care Education/Training Program | Admitting: Student in an Organized Health Care Education/Training Program

## 2019-07-01 VITALS — BP 167/100 | HR 65 | Temp 98.6°F | Resp 18 | Ht 70.0 in | Wt 170.0 lb

## 2019-07-01 DIAGNOSIS — M1712 Unilateral primary osteoarthritis, left knee: Secondary | ICD-10-CM | POA: Insufficient documentation

## 2019-07-01 DIAGNOSIS — M1711 Unilateral primary osteoarthritis, right knee: Secondary | ICD-10-CM | POA: Diagnosis present

## 2019-07-01 DIAGNOSIS — F4312 Post-traumatic stress disorder, chronic: Secondary | ICD-10-CM

## 2019-07-01 DIAGNOSIS — F1021 Alcohol dependence, in remission: Secondary | ICD-10-CM | POA: Diagnosis present

## 2019-07-01 DIAGNOSIS — G894 Chronic pain syndrome: Secondary | ICD-10-CM

## 2019-07-01 DIAGNOSIS — F209 Schizophrenia, unspecified: Secondary | ICD-10-CM

## 2019-07-01 DIAGNOSIS — Z765 Malingerer [conscious simulation]: Secondary | ICD-10-CM | POA: Diagnosis present

## 2019-07-01 DIAGNOSIS — M170 Bilateral primary osteoarthritis of knee: Secondary | ICD-10-CM | POA: Diagnosis present

## 2019-07-01 NOTE — Progress Notes (Signed)
Safety precautions to be maintained throughout the outpatient stay will include: orient to surroundings, keep bed in low position, maintain call bell within reach at all times, provide assistance with transfer out of bed and ambulation.  

## 2019-07-01 NOTE — Telephone Encounter (Signed)
New referral placed for pain management clinic

## 2019-07-01 NOTE — Telephone Encounter (Signed)
Copied from [place] [id number]. Topic: Referral - Request for Referral >> Jul 01, 2019  1:44 PM Erick Blinks wrote: Has patient seen PCP for this complaint? Yes.   *If NO, is insurance requiring patient see PCP for this issue before PCP can refer them? Referral for which specialty: Pain management  Preferred provider/office: Different Office/Provider Reason for referral: Pt called to report that provider Gillis Santa was rude and he does not want to return.  Best contact: [phone]

## 2019-07-01 NOTE — Telephone Encounter (Signed)
Patient notified

## 2019-07-06 ENCOUNTER — Other Ambulatory Visit: Payer: Self-pay

## 2019-07-06 ENCOUNTER — Emergency Department
Admission: EM | Admit: 2019-07-06 | Discharge: 2019-07-06 | Disposition: A | Discharge status: Home / Self Care | Payer: Medicaid Other | Attending: Emergency Medicine | Admitting: Emergency Medicine

## 2019-07-06 ENCOUNTER — Emergency Department: Payer: Medicaid Other

## 2019-07-06 DIAGNOSIS — F17200 Nicotine dependence, unspecified, uncomplicated: Secondary | ICD-10-CM | POA: Diagnosis not present

## 2019-07-06 DIAGNOSIS — I10 Essential (primary) hypertension: Secondary | ICD-10-CM | POA: Insufficient documentation

## 2019-07-06 DIAGNOSIS — J209 Acute bronchitis, unspecified: Secondary | ICD-10-CM | POA: Insufficient documentation

## 2019-07-06 DIAGNOSIS — Z79899 Other long term (current) drug therapy: Secondary | ICD-10-CM | POA: Insufficient documentation

## 2019-07-06 DIAGNOSIS — Z20828 Contact with and (suspected) exposure to other viral communicable diseases: Secondary | ICD-10-CM | POA: Insufficient documentation

## 2019-07-06 DIAGNOSIS — R05 Cough: Secondary | ICD-10-CM | POA: Diagnosis present

## 2019-07-06 DIAGNOSIS — J449 Chronic obstructive pulmonary disease, unspecified: Secondary | ICD-10-CM | POA: Diagnosis not present

## 2019-07-06 DIAGNOSIS — R0789 Other chest pain: Secondary | ICD-10-CM | POA: Diagnosis not present

## 2019-07-06 LAB — BRAIN NATRIURETIC PEPTIDE: B Natriuretic Peptide: 19 pg/mL (ref 0.0–100.0)

## 2019-07-06 LAB — BASIC METABOLIC PANEL
Anion gap: 5 (ref 5–15)
BUN: 13 mg/dL (ref 6–20)
CO2: 25 mmol/L (ref 22–32)
Calcium: 8.5 mg/dL — ABNORMAL LOW (ref 8.9–10.3)
Chloride: 105 mmol/L (ref 98–111)
Creatinine, Ser: 0.85 mg/dL (ref 0.61–1.24)
GFR calc Af Amer: >60 mL/min (ref >60)
GFR calc non Af Amer: >60 mL/min (ref >60)
Glucose, Bld: 126 mg/dL — ABNORMAL HIGH (ref 70–99)
Potassium: 4.2 mmol/L (ref 3.5–5.1)
Sodium: 135 mmol/L (ref 135–145)

## 2019-07-06 LAB — CBC
HCT: 43.5 % (ref 39.0–52.0)
Hemoglobin: 14.9 g/dL (ref 13.0–17.0)
MCH: 30.7 pg (ref 26.0–34.0)
MCHC: 34.3 g/dL (ref 30.0–36.0)
MCV: 89.7 fL (ref 80.0–100.0)
Platelets: 292 10*3/uL (ref 150–400)
RBC: 4.85 MIL/uL (ref 4.22–5.81)
RDW: 14.6 % (ref 11.5–15.5)
WBC: 9.7 10*3/uL (ref 4.0–10.5)
nRBC: 0 % (ref 0.0–0.2)

## 2019-07-06 LAB — TROPONIN I (HIGH SENSITIVITY)
Troponin I (High Sensitivity): 6 ng/L (ref <18)
Troponin I (High Sensitivity): 6 ng/L (ref <18)

## 2019-07-06 IMAGING — DX PORTABLE CHEST - 1 VIEW
1 series · 2 of 2 positions shown · non-contrast
Comparison: [DATE]

CLINICAL DATA: Chest pain.

EXAM:
PORTABLE CHEST 1 VIEW

[Series 1: chest ap · 0.14mm/px · 2 of 2 slices shown]
[im 1/2]
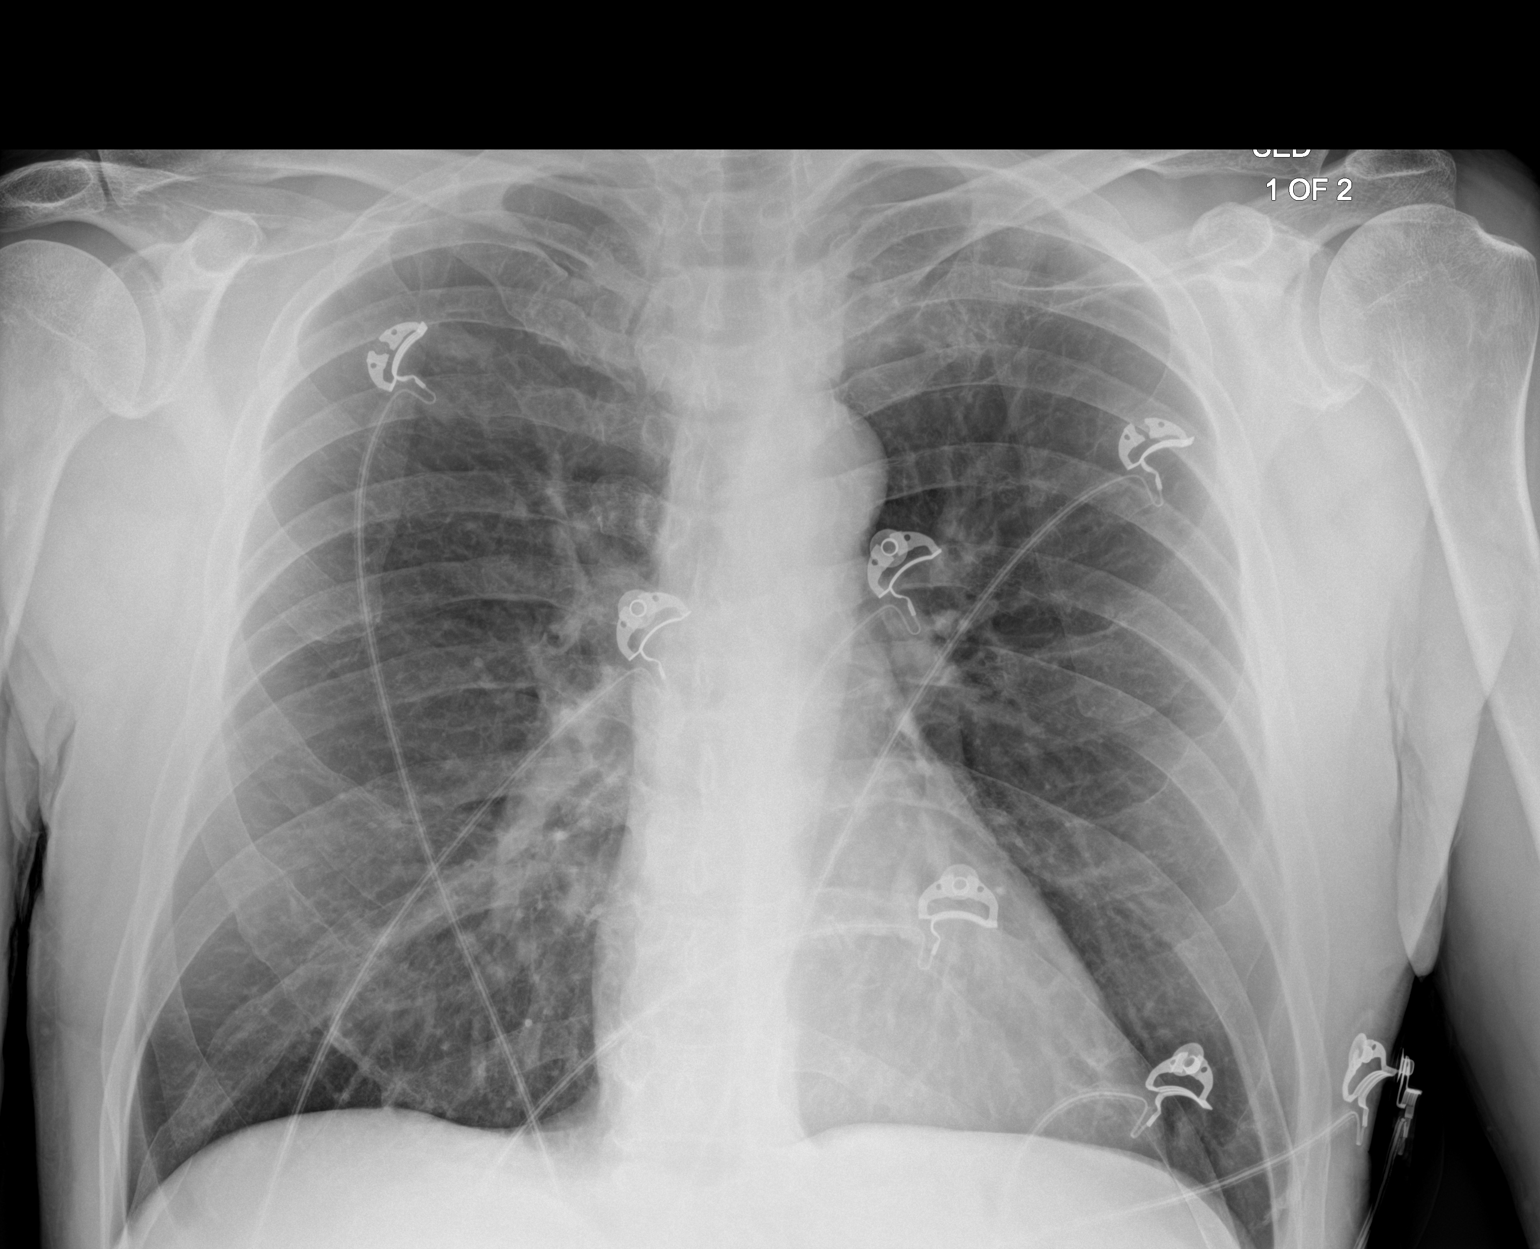
[im 2/2]
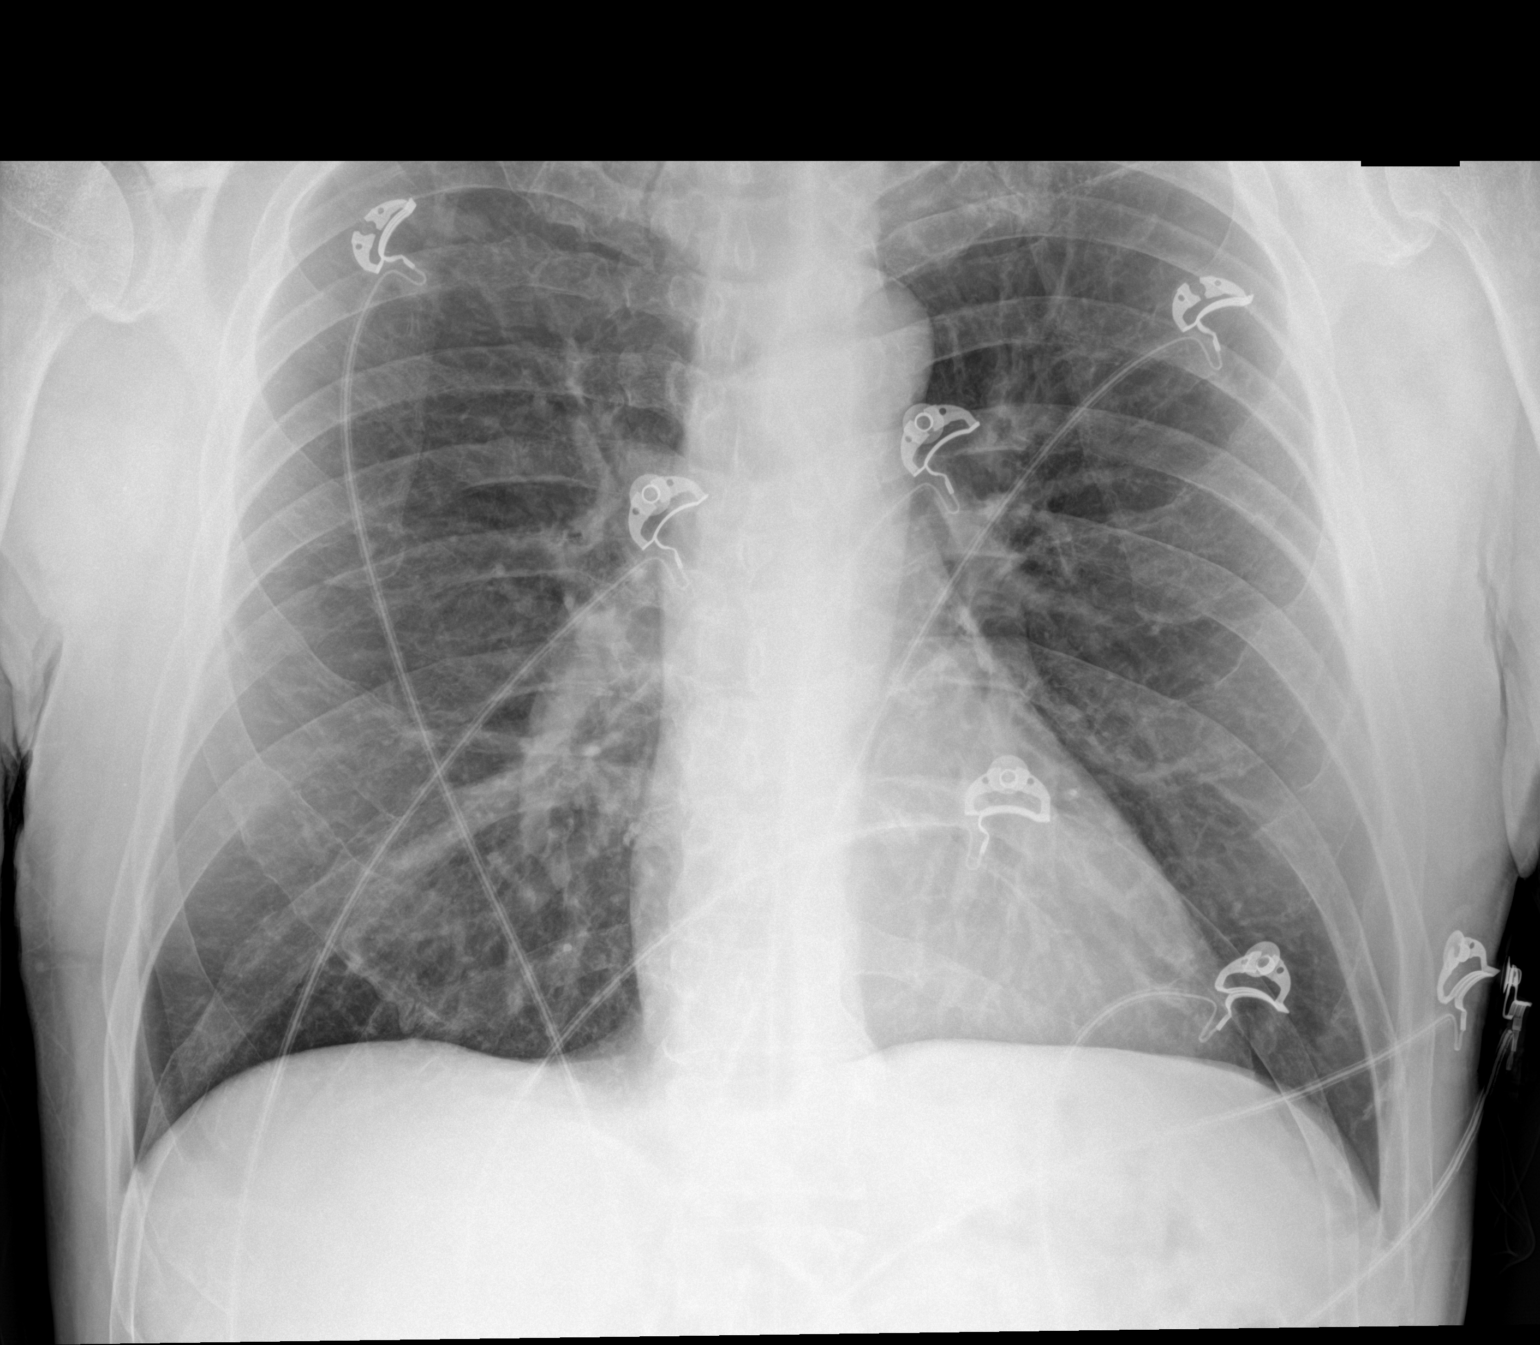

[2 of 2 positions shown; findings below may reference images not displayed]

FINDINGS: Cardiomediastinal silhouette is normal. Mediastinal contours appear
intact.

There is no evidence of focal airspace consolidation, pleural
effusion or pneumothorax.

Osseous structures are without acute abnormality. Soft tissues are
grossly normal.
IMPRESSION: No active disease.

## 2019-07-06 MED ORDER — ASPIRIN 81 MG PO CHEW
324.0000 mg | CHEWABLE_TABLET | Freq: Once | ORAL | Status: AC
  Administered 2019-07-06: 324 mg via ORAL
  Filled 2019-07-06: qty 4

## 2019-07-06 MED ORDER — LIDOCAINE 5 % EX PTCH
1.0000 | MEDICATED_PATCH | CUTANEOUS | Status: DC
  Administered 2019-07-06: 19:00:00 1 via TRANSDERMAL
  Filled 2019-07-06: qty 1

## 2019-07-06 MED ORDER — LIDOCAINE 5 % EX PTCH: 1.0000 | MEDICATED_PATCH | Freq: Two times a day (BID) | CUTANEOUS | 0 refills | Status: DC

## 2019-07-06 MED ORDER — AZITHROMYCIN 250 MG PO TABS: ORAL_TABLET | ORAL | 0 refills | Status: AC

## 2019-07-06 MED ORDER — ALBUTEROL SULFATE HFA 108 (90 BASE) MCG/ACT IN AERS
2.0000 | INHALATION_SPRAY | Freq: Once | RESPIRATORY_TRACT | Status: AC
  Administered 2019-07-06: 19:00:00 2 via RESPIRATORY_TRACT
  Filled 2019-07-06: qty 6.7

## 2019-07-06 NOTE — ED Triage Notes (Signed)
Pt arrives to ED from hotel via [place] for CP. States "its where my heart is." pt states symptoms x 4 days. Treated for pneumonia a month ago. Also productive cough. No fevers. State "every time my heart beats I can feel it." A&O upon arrival.

## 2019-07-06 NOTE — ED Provider Notes (Signed)
[facility] Emergency Department Provider Note   ____________________________________________   First MD Initiated Contact with Patient 07/06/19 1756     (approximate)  I have reviewed the triage vital signs and the nursing notes.   HISTORY  Chief Complaint Chest Pain    HPI Luke Briggs. is a 58 y.o. male with past medical history of hypertension, COPD, and alcohol abuse who presents to the ED complaining of chest pain.  Patient describes sharp pain in the left side of his chest, states "it feels like someone is stabbing me every time my heart beats".  Pain is intermittent but not exacerbated or alleviated by anything.  This is been associated with an increased cough that is productive of brownish sputum.  He denies any fevers or shortness of breath, does continue to smoke 1 pack/day cigarettes.  He was treated for pneumonia approximately a month ago, states he seemed to improve until about 4 days ago.  He has not noticed any pain or swelling in his legs, denies any history of DVT/PE.        Past Medical History:  Diagnosis Date  . Arthritis   . COPD (chronic obstructive pulmonary disease) ([place])   . Hypertension   . Prostate disorder     Patient Active Problem List   Diagnosis Date Noted  . Bilateral primary osteoarthritis of knee 07/01/2019  . Primary osteoarthritis of right knee 07/01/2019  . Primary osteoarthritis of left knee 07/01/2019  . Drug-seeking behavior 07/01/2019  . COPD, mild ([place]) 06/17/2019  . Controlled substance agreement signed 05/05/2019  . Chronic pain syndrome 04/28/2019  . BPH (benign prostatic hyperplasia) 04/28/2019  . Eczema 04/28/2019  . Essential hypertension 04/28/2019  . GERD (gastroesophageal reflux disease) 04/28/2019  . Schizophrenia ([place]) 04/28/2019  . Chronic post-traumatic stress disorder (PTSD) 04/28/2019  . Hemorrhoids 04/28/2019  . Nicotine dependence, cigarettes, w unsp disorders 04/28/2019  .  History of alcohol dependence ([place]) 04/28/2019    Past Surgical History:  Procedure Laterality Date  . EYE SURGERY Left     Prior to Admission medications   Medication Sig Start Date End Date Taking? Authorizing Provider  albuterol (VENTOLIN HFA) 108 (90 Base) MCG/ACT inhaler Inhale 2 puffs into the lungs every 6 (six) hours as needed for wheezing or shortness of breath. 05/31/19   Duffy Bruce, MD  azithromycin (ZITHROMAX Z-PAK) 250 MG tablet Take 2 tablets (500 mg) on  Day 1,  followed by 1 tablet (250 mg) once daily on Days 2 through 5. 07/06/19 07/11/19  Blake Divine, MD  buPROPion (WELLBUTRIN XL) 150 MG 24 hr tablet Take 1 tablet (150 mg total) by mouth daily. 04/28/19   [name], [name], NP  cloNIDine (CATAPRES) 0.1 MG tablet Take 1 tablet (0.1 mg total) by mouth 2 (two) times daily. 04/28/19   [name], [name], NP  finasteride (PROSCAR) 5 MG tablet Take 1 tablet (5 mg total) by mouth daily. 04/28/19   [name], [name], NP  HYDROcodone-acetaminophen (NORCO/VICODIN) 5-325 MG tablet Take 1 tablet by mouth every 6 (six) hours as needed for moderate pain. 06/19/19 07/19/19  [name], [name], NP  hydrocortisone (ANUSOL-HC) 2.5 % rectal cream Place 1 application rectally 2 (two) times daily. 04/28/19   [name], [name], NP  Ipratropium-Albuterol (COMBIVENT) 20-100 MCG/ACT AERS respimat Inhale 1 puff into the lungs every 6 (six) hours. 06/15/19   Johnn Hai, PA-C  lidocaine (LIDODERM) 5 % Place 1 patch onto the skin every 12 (twelve) hours. Remove &  Discard patch within 12 hours or as directed by MD 07/06/19 07/05/20  Blake Divine, MD  lisinopril (ZESTRIL) 10 MG tablet Take 1 tablet (10 mg total) by mouth daily. 05/05/19   [name], [name] T, NP  montelukast (SINGULAIR) 10 MG tablet Take 1 tablet (10 mg total) by mouth daily. 06/15/19 06/14/20  Johnn Hai, PA-C  omeprazole (PRILOSEC) 40 MG capsule Take 1 capsule (40 mg total) by mouth daily. 04/28/19   [name], [name], NP  oxybutynin  (DITROPAN) 5 MG tablet Take 1 tablet (5 mg total) by mouth 2 (two) times daily. Patient not taking: Reported on 07/01/2019 04/28/19   Marnee Guarneri T, NP  tamsulosin (FLOMAX) 0.4 MG CAPS capsule Take 1 capsule (0.4 mg total) by mouth daily. 04/28/19   [name], [name], NP  tiotropium (SPIRIVA HANDIHALER) 18 MCG inhalation capsule Place 1 capsule (18 mcg total) into inhaler and inhale daily. 04/28/19   [name], [name], NP  triamcinolone cream (KENALOG) 0.1 % Apply 1 application topically 2 (two) times daily. 04/28/19   Marnee Guarneri T, NP    Allergies Penicillins  Family History  Problem Relation Age of Onset  . Heart disease Mother   . Osteoporosis Mother   . Heart disease Father   . Emphysema Father   . Heart disease Sister   . Emphysema Maternal Grandmother   . Heart disease Maternal Grandfather   . Osteoporosis Sister     Social History Social History   Tobacco Use  . Smoking status: Current Every Day Smoker    Packs/day: 0.50  . Smokeless tobacco: Never Used  Substance Use Topics  . Alcohol use: Not Currently    Comment: beer on occasion  . Drug use: Never    Review of Systems  Constitutional: No fever/chills Eyes: No visual changes. ENT: No sore throat. Cardiovascular: Positive for chest pain. Respiratory: Denies shortness of breath.  Positive for cough. Gastrointestinal: No abdominal pain.  No nausea, no vomiting.  No diarrhea.  No constipation. Genitourinary: Negative for dysuria. Musculoskeletal: Negative for back pain. Skin: Negative for rash. Neurological: Negative for headaches, focal weakness or numbness.  ____________________________________________   PHYSICAL EXAM:  VITAL SIGNS: ED Triage Vitals  Enc Vitals Group     BP      Pulse      Resp      Temp      Temp src      SpO2      Weight      Height      Head Circumference      Peak Flow      Pain Score      Pain Loc      Pain Edu?      Excl. in [place]?     Constitutional: Alert and  oriented. Eyes: Conjunctivae are normal. Head: Atraumatic. Nose: No congestion/rhinnorhea. Mouth/Throat: Mucous membranes are moist. Neck: Normal ROM Cardiovascular: Normal rate, regular rhythm. Grossly normal heart sounds. Respiratory: Normal respiratory effort.  No retractions. Lungs CTAB.  Left chest wall tender to palpation with reproduction of previous pain. Gastrointestinal: Soft and nontender. No distention. Genitourinary: deferred Musculoskeletal: No lower extremity tenderness nor edema. Neurologic:  Normal speech and language. No gross focal neurologic deficits are appreciated. Skin:  Skin is warm, dry and intact. No rash noted. Psychiatric: Mood and affect are normal. Speech and behavior are normal.  ____________________________________________   LABS (all labs ordered are listed, but only abnormal results are displayed)  Labs Reviewed  BASIC METABOLIC PANEL -  Abnormal; Notable for the following components:      Result Value   Glucose, Bld 126 (*)    Calcium 8.5 (*)    All other components within normal limits  SARS CORONAVIRUS 2  CBC  BRAIN NATRIURETIC PEPTIDE  TROPONIN I (HIGH SENSITIVITY)  TROPONIN I (HIGH SENSITIVITY)   ____________________________________________  EKG  ED ECG REPORT I, Blake Divine, the attending physician, personally viewed and interpreted this ECG.   Date: 07/06/2019  EKG Time: 17:57  Rate: 77  Rhythm: normal sinus rhythm  Axis: Normal  Intervals:none  ST&T Change: None    PROCEDURES  Procedure(s) performed (including Critical Care):  Procedures   ____________________________________________   INITIAL IMPRESSION / ASSESSMENT AND PLAN / ED COURSE       58 year old male with history of COPD, hypertension, and alcohol abuse presents to the ED with sharp left-sided chest pain worse upon palpation over the past 4 days.  EKG without acute ischemic changes and history not particularly concerning for ACS.  Appears more  consistent with pneumonia versus bronchitis, will check chest x-ray.  Will give aspirin and Lidoderm patch to treat patient's pain, additionally give albuterol for cough.  Will check 2 sets of troponin to further assess for ACS, but he is appropriate for discharge home if these are negative given his heart score of 2.  Do not suspect PE as he is low risk by [place], symptoms not clinically consistent with dissection.  2 sets of troponin negative and remainder of labs unremarkable.  Patient reports symptoms improved following albuterol, aspirin, and Lidoderm patch.  Chest x-ray negative for acute process.  Suspect bronchitis with an element of COPD exacerbation, counseled patient to continue albuterol at home and with his sputum changes will prescribe azithromycin.  Counseled to follow-up with his PCP and return to the ED for new or worsening symptoms, patient agrees with plan.      ____________________________________________   FINAL CLINICAL IMPRESSION(S) / ED DIAGNOSES  Final diagnoses:  Atypical chest pain  Acute bronchitis, unspecified organism     ED Discharge Orders         Ordered    azithromycin (ZITHROMAX Z-PAK) 250 MG tablet     07/06/19 2127    lidocaine (LIDODERM) 5 %  Every 12 hours     07/06/19 2127           Note:  This document was prepared using Dragon voice recognition software and may include unintentional dictation errors.   Blake Divine, MD 07/07/19 [phone]

## 2019-07-07 LAB — SARS CORONAVIRUS 2 (TAT 6-24 HRS): SARS Coronavirus 2: NEGATIVE

## 2019-07-13 ENCOUNTER — Telehealth: Payer: Self-pay | Admitting: Nurse Practitioner

## 2019-07-13 ENCOUNTER — Other Ambulatory Visit: Payer: Self-pay | Admitting: Nurse Practitioner

## 2019-07-13 DIAGNOSIS — G894 Chronic pain syndrome: Secondary | ICD-10-CM

## 2019-07-13 NOTE — Telephone Encounter (Signed)
Placed new referral.  Reach out to different facilties locally, [place] to see who would be able to see this patient with long term opioid use [place]CP.

## 2019-07-13 NOTE — Telephone Encounter (Signed)
Received note from [facility] pain management, referral declined due to drug seeking behavior and aggression toward staff.  Please advise where you would like to send referral.

## 2019-07-20 ENCOUNTER — Encounter: Payer: Self-pay | Admitting: Nurse Practitioner

## 2019-07-20 ENCOUNTER — Telehealth: Payer: Self-pay

## 2019-07-20 ENCOUNTER — Ambulatory Visit (INDEPENDENT_AMBULATORY_CARE_PROVIDER_SITE_OTHER): Payer: Medicaid Other | Admitting: Nurse Practitioner

## 2019-07-20 ENCOUNTER — Other Ambulatory Visit: Payer: Self-pay

## 2019-07-20 VITALS — BP 127/86

## 2019-07-20 DIAGNOSIS — I10 Essential (primary) hypertension: Secondary | ICD-10-CM | POA: Diagnosis not present

## 2019-07-20 DIAGNOSIS — F17219 Nicotine dependence, cigarettes, with unspecified nicotine-induced disorders: Secondary | ICD-10-CM

## 2019-07-20 DIAGNOSIS — G894 Chronic pain syndrome: Secondary | ICD-10-CM

## 2019-07-20 MED ORDER — HYDROCODONE-ACETAMINOPHEN 5-325 MG PO TABS: 1.0000 | ORAL_TABLET | Freq: Four times a day (QID) | ORAL | 0 refills | Status: DC | PRN

## 2019-07-20 NOTE — Patient Instructions (Signed)

## 2019-07-20 NOTE — Assessment & Plan Note (Addendum)
Chronic, ongoing with opioid use since September 2018, well documented on previous provider notes and in PMP.  Have placed referral to [facility] in [place] for assistance with patient, [facility] declined referral.  He has tolerated reduction from 10/325 to [place], is agreeable to discussing alternate pain medication options and further reductions, just does not want to be taken off pain medication "cold [place]".  CCM referral placed to assist provider in finding a pain management environment for patient.  Will also discuss with him at next visit a referral to psychiatry, which would be beneficial.  Sent Norco script refill today for 30 days supply only with 0 refills.

## 2019-07-20 NOTE — Assessment & Plan Note (Signed)
Chronic, stable with improvement on home BPs with increase in Lisinopril.  Continue current medication regimen and daily BP checks at home.  May consider switch from Lisinopril to Losartan in upcoming months due to underlying COPD.  Plan to have him return in 4 weeks for in office visit with labs.

## 2019-07-20 NOTE — Telephone Encounter (Addendum)
Prior Authorization initiated via [employer] for Hydrocodone Confirmation #: [id number]

## 2019-07-20 NOTE — Assessment & Plan Note (Signed)
I have recommended complete cessation of tobacco use. I have discussed various options available for assistance with tobacco cessation including over the counter methods (Nicotine gum, patch and lozenges). We also discussed prescription options (Chantix, Nicotine Inhaler / Nasal Spray). The patient is not interested in pursuing any prescription tobacco cessation options at this time.  

## 2019-07-20 NOTE — Progress Notes (Signed)
BP 127/86 Comment: pt reported   Subjective:    Patient ID: Luke Pollen., male    DOB: May 09, 1961, 58 y.o.   MRN: [id number]  HPI: Luke Haris. is a 58 y.o. male  Chief Complaint  Patient presents with   Pain    3 week f/up- states he needs a refill     This visit was completed via telephone due to the restrictions of the COVID-19 pandemic. All issues as above were discussed and addressed but no physical exam was performed. If it was felt that the patient should be evaluated in the office, they were directed there. The patient verbally consented to this visit. Patient was unable to complete an audio/visual visit due to Lack of equipment. Due to the catastrophic nature of the COVID-19 pandemic, this visit was done through audio contact only.  Location of the patient: home  Location of the provider: home  Those involved with this call:   Provider: Marnee Guarneri, DNP  CMA: [name], [place] Desk/Registration: [name]   Time spent on call: 15 minutes on the phone discussing health concerns. 10 minutes total spent in review of patient's record and preparation of their chart.   I verified patient identity using two factors (patient name and date of birth). Patient consents verbally to being seen via telemedicine visit today.    HYPERTENSION Lisinopril increased at previous visit to 10 MG + continues on Clonidine.  Reports BP has improved and he continues to attempt cutting back on cigarettes.   Hypertension status: stable  Satisfied with current treatment? yes Duration of hypertension: chronic BP monitoring frequency:  daily BP range: 120-130/70's average at home BP medication side effects:  no Medication compliance: good compliance Aspirin: no Recurrent headaches: no Visual changes: no Palpitations: no Dyspnea: no Chest pain: no Lower extremity edema: no Dizzy/lightheaded: no   CHRONIC PAIN: On Norco for two years, currently takes  this four times a day.  On review of PMP his scripts for this go back to 08/08/2017 when he was prescribed Norco 10/325. Has arthritis in neck, knees, hands, lower back. States he has deterioration ofdiscsin neck.[facility] Dr. Christen Bame.Urine drug screen negative on 04/28/2019, had been without pain meds for 3 weeks. He is very accepting of UDS and monitoring as did this with previous doctor.Reports pain at baseline is10/10 at worst and 7/10 at best to knees, neck, hands,neck. Has had injections in both knees, steroidwhich helped for 1-2 weeks but then stopped. No history epidural injections, but is willing to try. Took Gabapentin at one time, but no longer takes as it made sick to stomach.   Attended pain management clinic at [facility] on 07/01/2019 and patient admits he spoke inappropriately to physician in office.  Discussed at length with him appropriate engagement with providers is important to care.  He states "I know, I think I just felt they were going to cut me off cold [place] and I don't want to feel sick like that", endorses regrets over how he acted.  Has been out of medication for weeks before and reports it made him feel bad, so he fears being "just cut off".  He is willing to trial reduction if a pain management clinic discussed this with him, but he does not want to quit "cold [place]".  He also reports he is willing to try other things in conjunction with his current regimen to help him use less or come  off opioids.  At this time is taking 5/325 pills vs 10/325 pills and has been tolerating this well, is okay with staying on this dose.  Discussed with him referral to CCM team to assist in helping find chronic pain management practice for him, he agrees to this referral.  Discussed at length with him importance of appropriate behavior at any upcoming pain management visits and to go in with open mind, willing to work towards safer  pain management goals (such as continued reduction in medication and trying other pain management options).  Educated him on risk of opioids in patient with COPD and history of heavy alcohol use.    Relevant past medical, surgical, family and social history reviewed and updated as indicated. Interim medical history since our last visit reviewed. Allergies and medications reviewed and updated.  Review of Systems  Constitutional: Negative for activity change, diaphoresis, fatigue and fever.  Respiratory: Negative for cough, chest tightness, shortness of breath and wheezing.   Cardiovascular: Negative for chest pain, palpitations and leg swelling.  Gastrointestinal: Negative for abdominal distention, abdominal pain, constipation, diarrhea, nausea and vomiting.  Musculoskeletal: Positive for arthralgias.  Neurological: Negative for dizziness, syncope, weakness, light-headedness, numbness and headaches.  Psychiatric/Behavioral: Negative.     Per HPI unless specifically indicated above     Objective:    BP 127/86 Comment: pt reported  Wt Readings from Last 3 Encounters:  07/06/19 170 lb (77.1 kg)  07/01/19 170 lb (77.1 kg)  06/15/19 170 lb (77.1 kg)    Physical Exam   Unable to perform, telephone visit only.  Results for orders placed or performed during the hospital encounter of 07/06/19  SARS CORONAVIRUS 2 Nasal Swab Aptima Multi Swab   Specimen: Aptima Multi Swab; Nasal Swab  Result Value Ref Range   SARS Coronavirus 2 NEGATIVE NEGATIVE  Basic metabolic panel  Result Value Ref Range   Sodium 135 135 - 145 mmol/L   Potassium 4.2 3.5 - 5.1 mmol/L   Chloride 105 98 - 111 mmol/L   CO2 25 22 - 32 mmol/L   Glucose, Bld 126 (H) 70 - 99 mg/dL   BUN 13 6 - 20 mg/dL   Creatinine, Ser 0.85 0.61 - 1.24 mg/dL   Calcium 8.5 (L) 8.9 - 10.3 mg/dL   GFR calc non Af Amer >60 >60 mL/min   GFR calc Af Amer >60 >60 mL/min   Anion gap 5 5 - 15  CBC  Result Value Ref Range   WBC 9.7 4.0 -  10.5 K/uL   RBC 4.85 4.22 - 5.81 MIL/uL   Hemoglobin 14.9 13.0 - 17.0 g/dL   HCT 43.5 39.0 - 52.0 %   MCV 89.7 80.0 - 100.0 fL   MCH 30.7 26.0 - 34.0 pg   MCHC 34.3 30.0 - 36.0 g/dL   RDW 14.6 11.5 - 15.5 %   Platelets 292 150 - 400 K/uL   nRBC 0.0 0.0 - 0.2 %  Brain natriuretic peptide  Result Value Ref Range   B Natriuretic Peptide 19.0 0.0 - 100.0 pg/mL  Troponin I (High Sensitivity)  Result Value Ref Range   Troponin I (High Sensitivity) 6 <18 ng/L  Troponin I (High Sensitivity)  Result Value Ref Range   Troponin I (High Sensitivity) 6 <18 ng/L      Assessment & Plan:   Problem List Items Addressed This Visit      Cardiovascular and Mediastinum   Essential hypertension - Primary    Chronic, stable with improvement on  home BPs with increase in Lisinopril.  Continue current medication regimen and daily BP checks at home.  May consider switch from Lisinopril to Losartan in upcoming months due to underlying COPD.  Plan to have him return in 4 weeks for in office visit with labs.      Relevant Orders   Ambulatory referral to Chronic Care Management Services     Nervous and Auditory   Nicotine dependence, cigarettes, w unsp disorders    I have recommended complete cessation of tobacco use. I have discussed various options available for assistance with tobacco cessation including over the counter methods (Nicotine gum, patch and lozenges). We also discussed prescription options (Chantix, Nicotine Inhaler / Nasal Spray). The patient is not interested in pursuing any prescription tobacco cessation options at this time.        Other   Chronic pain syndrome    Chronic, ongoing with opioid use since September 2018, well documented on previous provider notes and in PMP.  Have placed referral to [facility] in [place] for assistance with patient, [facility] declined referral.  He has tolerated reduction from 10/325 to [place], is agreeable to discussing alternate pain medication  options and further reductions, just does not want to be taken off pain medication "cold [place]".  CCM referral placed to assist provider in finding a pain management environment for patient.  Will also discuss with him at next visit a referral to psychiatry, which would be beneficial.  Sent Norco script refill today for 30 days supply only with 0 refills.        Relevant Medications   HYDROcodone-acetaminophen (NORCO/VICODIN) 5-325 MG tablet   Other Relevant Orders   Ambulatory referral to Chronic Care Management Services      I discussed the assessment and treatment plan with the patient. The patient was provided an opportunity to ask questions and all were answered. The patient agreed with the plan and demonstrated an understanding of the instructions.   The patient was advised to call back or seek an in-person evaluation if the symptoms worsen or if the condition fails to improve as anticipated.   I provided 15 minutes of time during this encounter.  Follow up plan: Return in about 4 weeks (around 08/17/2019) for Chronic pain, HTN/HLD, COPD.

## 2019-07-21 NOTE — Telephone Encounter (Signed)
PA Approved and patient notified.

## 2019-07-29 ENCOUNTER — Other Ambulatory Visit: Payer: Self-pay | Admitting: Nurse Practitioner

## 2019-08-02 ENCOUNTER — Other Ambulatory Visit: Payer: Self-pay | Admitting: Nurse Practitioner

## 2019-08-02 DIAGNOSIS — G894 Chronic pain syndrome: Secondary | ICD-10-CM

## 2019-08-02 NOTE — Progress Notes (Signed)
Pain management referral

## 2019-08-07 ENCOUNTER — Encounter: Payer: Self-pay | Admitting: Emergency Medicine

## 2019-08-07 ENCOUNTER — Emergency Department: Payer: Medicaid Other

## 2019-08-07 ENCOUNTER — Other Ambulatory Visit: Payer: Self-pay

## 2019-08-07 ENCOUNTER — Observation Stay
Admission: EM | Admit: 2019-08-07 | Discharge: 2019-08-09 | DRG: 916 | Disposition: A | Discharge status: Home / Self Care | Payer: Medicaid Other | Attending: Internal Medicine | Admitting: Internal Medicine

## 2019-08-07 DIAGNOSIS — I10 Essential (primary) hypertension: Secondary | ICD-10-CM | POA: Diagnosis present

## 2019-08-07 DIAGNOSIS — M1990 Unspecified osteoarthritis, unspecified site: Secondary | ICD-10-CM | POA: Diagnosis not present

## 2019-08-07 DIAGNOSIS — T783XXA Angioneurotic edema, initial encounter: Secondary | ICD-10-CM | POA: Diagnosis present

## 2019-08-07 DIAGNOSIS — J449 Chronic obstructive pulmonary disease, unspecified: Secondary | ICD-10-CM | POA: Diagnosis present

## 2019-08-07 DIAGNOSIS — T782XXA Anaphylactic shock, unspecified, initial encounter: Secondary | ICD-10-CM

## 2019-08-07 DIAGNOSIS — Z825 Family history of asthma and other chronic lower respiratory diseases: Secondary | ICD-10-CM | POA: Diagnosis not present

## 2019-08-07 DIAGNOSIS — Z20828 Contact with and (suspected) exposure to other viral communicable diseases: Secondary | ICD-10-CM | POA: Diagnosis present

## 2019-08-07 DIAGNOSIS — Z79899 Other long term (current) drug therapy: Secondary | ICD-10-CM | POA: Diagnosis not present

## 2019-08-07 DIAGNOSIS — T464X5A Adverse effect of angiotensin-converting-enzyme inhibitors, initial encounter: Secondary | ICD-10-CM | POA: Diagnosis not present

## 2019-08-07 DIAGNOSIS — K219 Gastro-esophageal reflux disease without esophagitis: Secondary | ICD-10-CM | POA: Diagnosis not present

## 2019-08-07 DIAGNOSIS — Z880 Allergy status to penicillin: Secondary | ICD-10-CM | POA: Diagnosis not present

## 2019-08-07 DIAGNOSIS — Z7952 Long term (current) use of systemic steroids: Secondary | ICD-10-CM | POA: Diagnosis not present

## 2019-08-07 DIAGNOSIS — F17210 Nicotine dependence, cigarettes, uncomplicated: Secondary | ICD-10-CM | POA: Diagnosis not present

## 2019-08-07 DIAGNOSIS — F209 Schizophrenia, unspecified: Secondary | ICD-10-CM | POA: Diagnosis not present

## 2019-08-07 DIAGNOSIS — N400 Enlarged prostate without lower urinary tract symptoms: Secondary | ICD-10-CM | POA: Diagnosis present

## 2019-08-07 DIAGNOSIS — F4310 Post-traumatic stress disorder, unspecified: Secondary | ICD-10-CM | POA: Diagnosis not present

## 2019-08-07 DIAGNOSIS — Z8249 Family history of ischemic heart disease and other diseases of the circulatory system: Secondary | ICD-10-CM | POA: Diagnosis not present

## 2019-08-07 DIAGNOSIS — J438 Other emphysema: Secondary | ICD-10-CM | POA: Diagnosis present

## 2019-08-07 HISTORY — DX: Unspecified hemorrhoids: K64.9

## 2019-08-07 HISTORY — DX: Angioneurotic edema, initial encounter: T78.3XXA

## 2019-08-07 HISTORY — DX: Gastro-esophageal reflux disease without esophagitis: K21.9

## 2019-08-07 HISTORY — DX: Post-traumatic stress disorder, unspecified: F43.10

## 2019-08-07 HISTORY — DX: Schizophrenia, unspecified: F20.9

## 2019-08-07 LAB — CBC WITH DIFFERENTIAL/PLATELET
Abs Immature Granulocytes: 0.1 10*3/uL — ABNORMAL HIGH (ref 0.00–0.07)
Basophils Absolute: 0.1 10*3/uL (ref 0.0–0.1)
Basophils Relative: 0 %
Eosinophils Absolute: 0.1 10*3/uL (ref 0.0–0.5)
Eosinophils Relative: 1 %
HCT: 48.8 % (ref 39.0–52.0)
Hemoglobin: 16.8 g/dL (ref 13.0–17.0)
Immature Granulocytes: 1 %
Lymphocytes Relative: 4 %
Lymphs Abs: 0.8 10*3/uL (ref 0.7–4.0)
MCH: 30.9 pg (ref 26.0–34.0)
MCHC: 34.4 g/dL (ref 30.0–36.0)
MCV: 89.9 fL (ref 80.0–100.0)
Monocytes Absolute: 1.2 10*3/uL — ABNORMAL HIGH (ref 0.1–1.0)
Monocytes Relative: 6 %
Neutro Abs: 19 10*3/uL — ABNORMAL HIGH (ref 1.7–7.7)
Neutrophils Relative %: 88 %
Platelets: 413 10*3/uL — ABNORMAL HIGH (ref 150–400)
RBC: 5.43 MIL/uL (ref 4.22–5.81)
RDW: 12.9 % (ref 11.5–15.5)
WBC: 21.3 10*3/uL — ABNORMAL HIGH (ref 4.0–10.5)
nRBC: 0 % (ref 0.0–0.2)

## 2019-08-07 LAB — BASIC METABOLIC PANEL
Anion gap: 10 (ref 5–15)
BUN: 13 mg/dL (ref 6–20)
CO2: 25 mmol/L (ref 22–32)
Calcium: 9.2 mg/dL (ref 8.9–10.3)
Chloride: 99 mmol/L (ref 98–111)
Creatinine, Ser: 0.92 mg/dL (ref 0.61–1.24)
GFR calc Af Amer: >60 mL/min (ref >60)
GFR calc non Af Amer: >60 mL/min (ref >60)
Glucose, Bld: 129 mg/dL — ABNORMAL HIGH (ref 70–99)
Potassium: 4.1 mmol/L (ref 3.5–5.1)
Sodium: 134 mmol/L — ABNORMAL LOW (ref 135–145)

## 2019-08-07 IMAGING — DX DG CHEST 1V PORT
1 series · 2 of 2 positions shown · non-contrast
Comparison: [DATE]

CLINICAL DATA: Angioedema.

EXAM:
PORTABLE CHEST 1 VIEW

[Series 1: chest ap · 0.14mm/px · 2 of 2 slices shown]
[im 1/2]
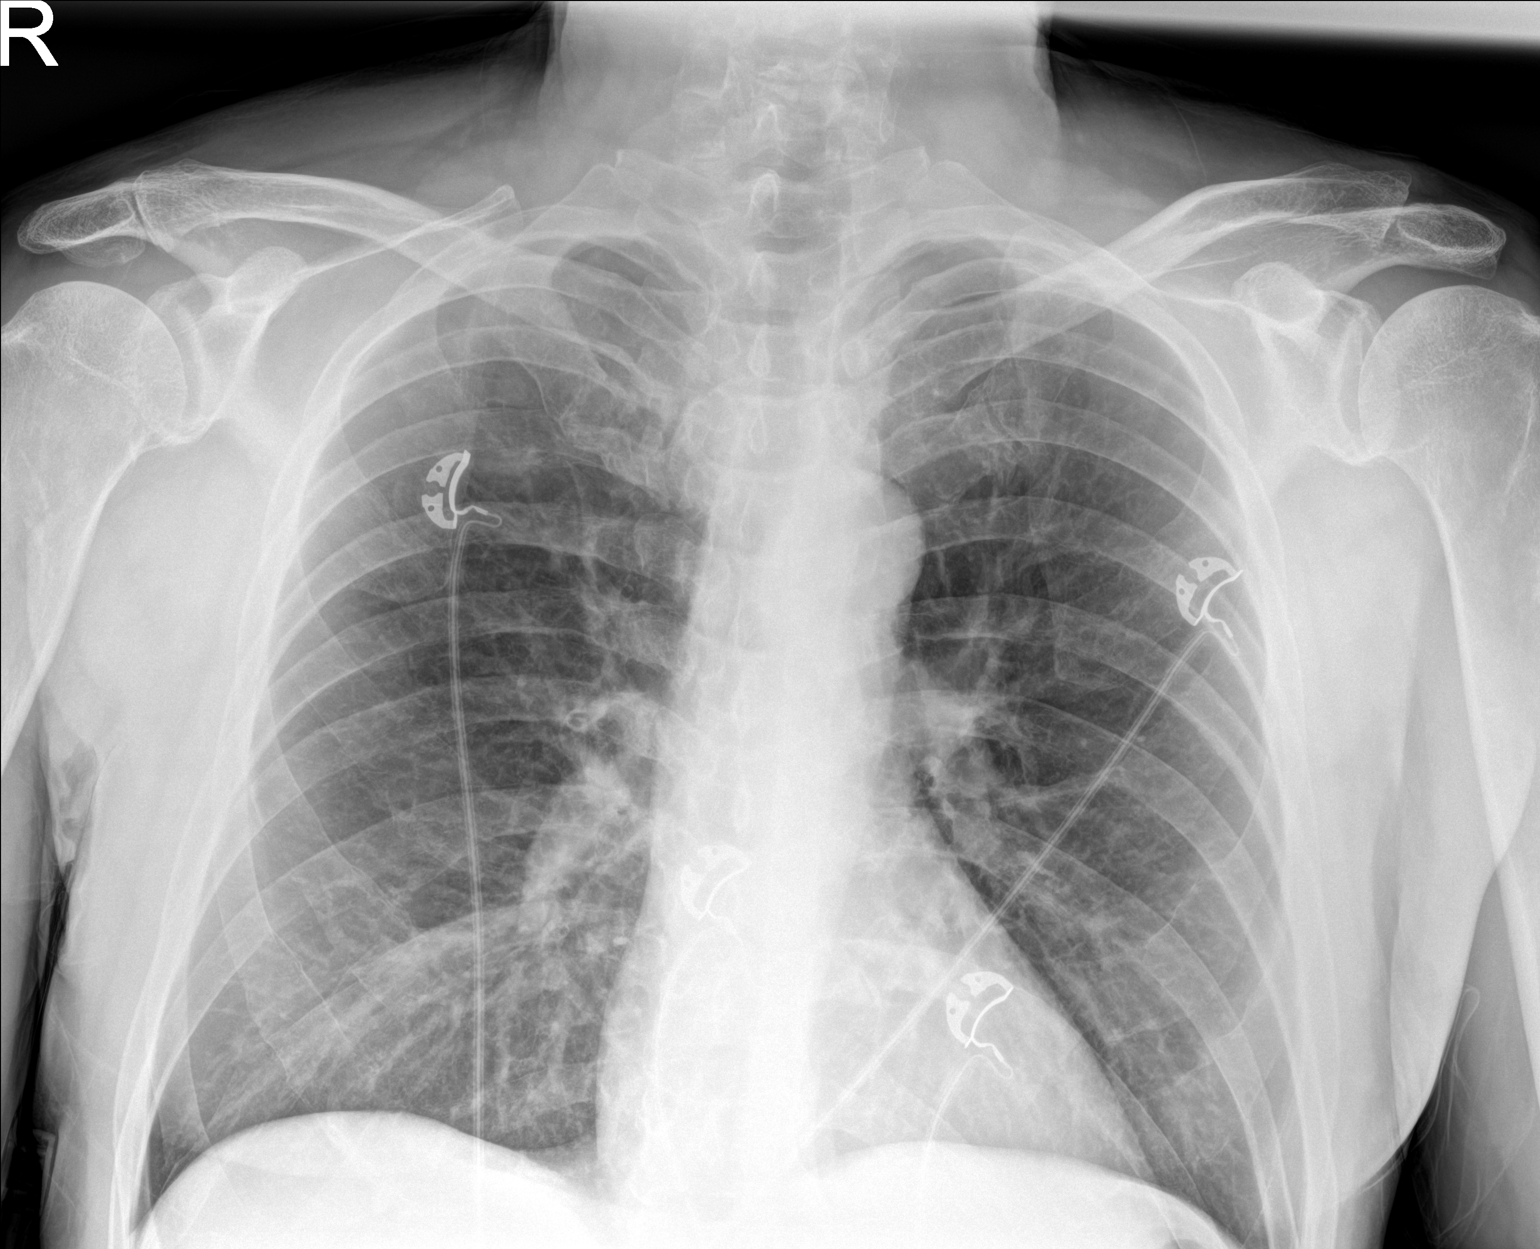
[im 2/2]
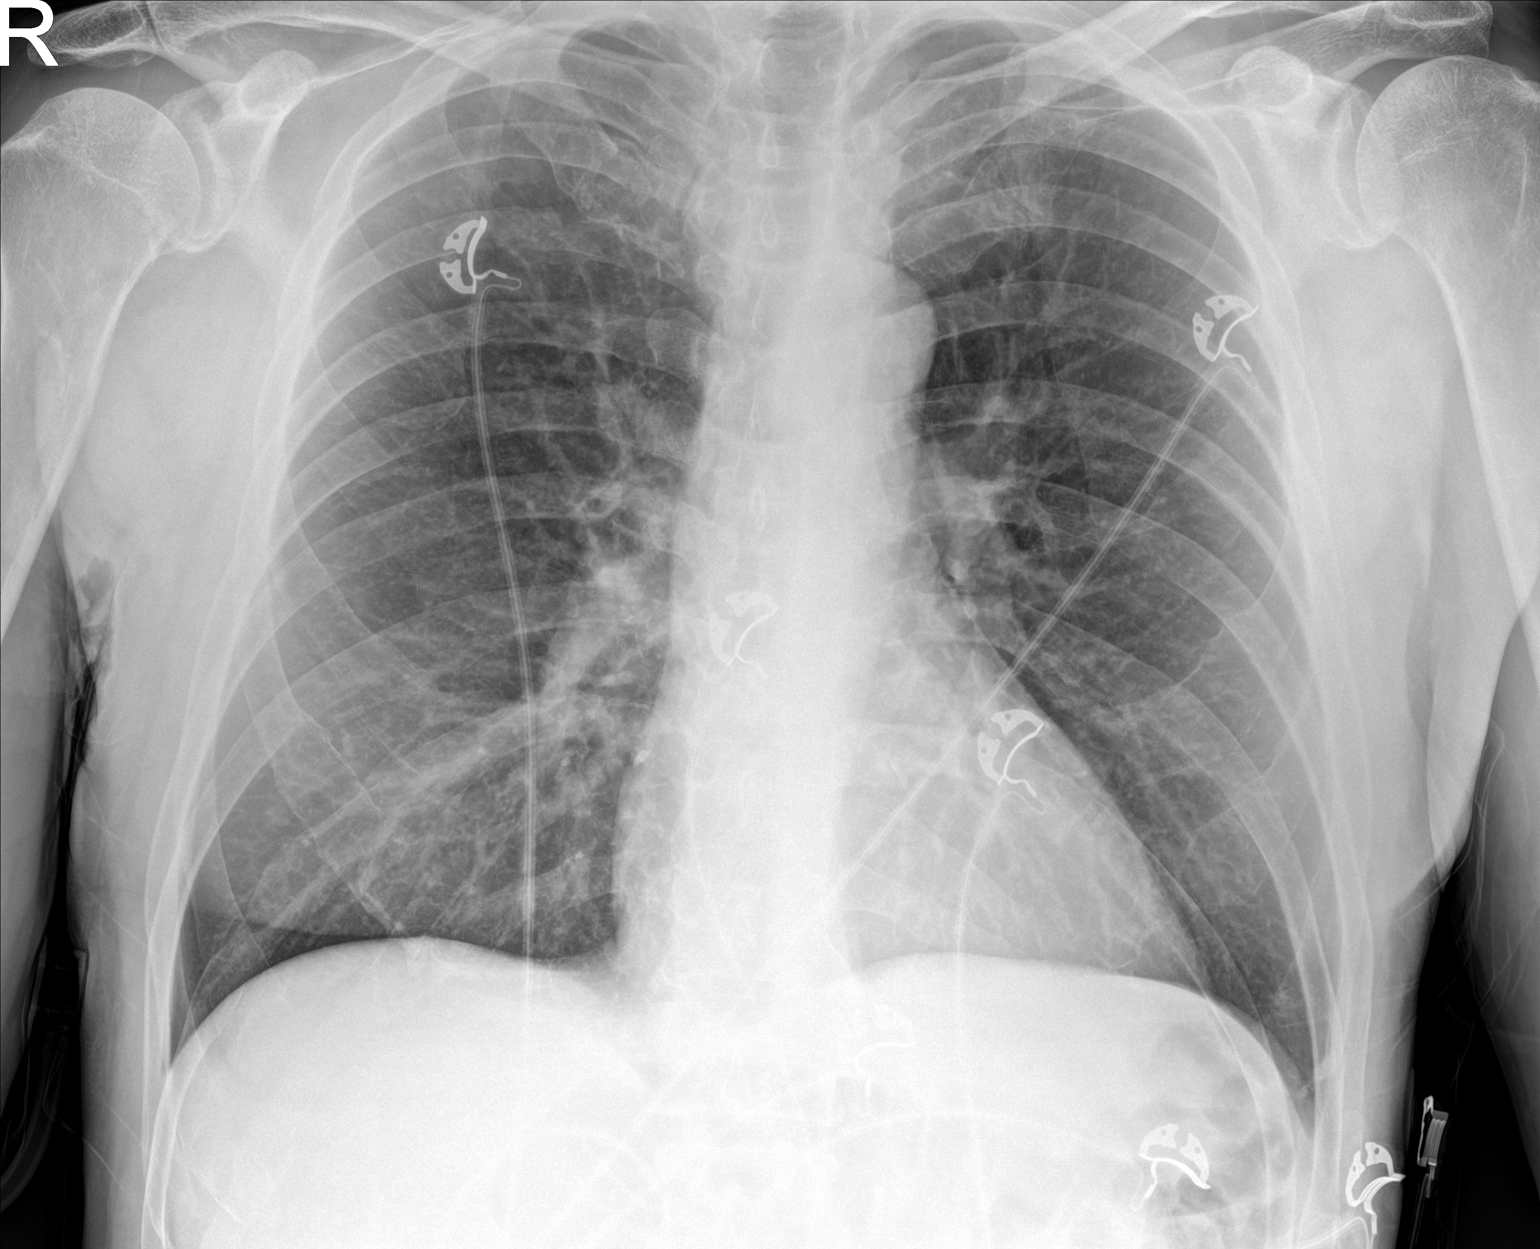

[2 of 2 positions shown; findings below may reference images not displayed]

FINDINGS: The heart size and mediastinal contours are within normal limits.
Both lungs are clear. The visualized skeletal structures are
unremarkable.
IMPRESSION: No active disease.

## 2019-08-07 MED ORDER — DIPHENHYDRAMINE HCL 50 MG/ML IJ SOLN
25.0000 mg | Freq: Once | INTRAMUSCULAR | Status: AC
  Administered 2019-08-07: 25 mg via INTRAVENOUS
  Filled 2019-08-07: qty 1

## 2019-08-07 MED ORDER — SODIUM CHLORIDE 0.9 % IV BOLUS
1000.0000 mL | Freq: Once | INTRAVENOUS | Status: AC
  Administered 2019-08-07: 23:00:00 1000 mL via INTRAVENOUS

## 2019-08-07 MED ORDER — FAMOTIDINE IN NACL 20-0.9 MG/50ML-% IV SOLN
20.0000 mg | Freq: Once | INTRAVENOUS | Status: AC
  Administered 2019-08-07: 20 mg via INTRAVENOUS

## 2019-08-07 MED ORDER — EPINEPHRINE 0.3 MG/0.3ML IJ SOAJ
0.3000 mg | Freq: Once | INTRAMUSCULAR | Status: AC
  Administered 2019-08-07: 0.3 mg via INTRAMUSCULAR

## 2019-08-07 MED ORDER — METHYLPREDNISOLONE SODIUM SUCC 125 MG IJ SOLR
125.0000 mg | Freq: Once | INTRAMUSCULAR | Status: AC
  Administered 2019-08-07: 125 mg via INTRAVENOUS

## 2019-08-07 MED ORDER — DIPHENHYDRAMINE HCL 50 MG/ML IJ SOLN
25.0000 mg | Freq: Once | INTRAMUSCULAR | Status: AC
  Administered 2019-08-07: 25 mg via INTRAVENOUS

## 2019-08-07 NOTE — H&P (Signed)
[facility] NAME: Luke Briggs    MR#:  [id number]  DATE OF BIRTH:  02-07-1961  DATE OF ADMISSION:  08/07/2019  PRIMARY CARE PHYSICIAN: [name], NP   REQUESTING/REFERRING PHYSICIAN: Beather Arbour, MD  CHIEF COMPLAINT:   Chief Complaint  Patient presents with  . Angioedema    HISTORY OF PRESENT ILLNESS:  Luke Briggs  is a 58 y.o. male who presents with chief complaint as above.  Patient presents the ED with a complaint of lip and face swelling.  He states this started around 8:00 in the evening.  He does state that he ate some seafood, some scallops.  However, he states that he is eaten scallops and other seafood and shellfish multiple times in the past, never had any reaction.  Patient is also on lisinopril and has been for the last 20 years or so per his report.  On evaluation in the ED he is found to have significant angioedema, though without posterior pharyngeal involvement.  He was given epinephrine, Solu-Medrol, Benadryl, Pepcid.  Hospitalist were called for admission  PAST MEDICAL HISTORY:   Past Medical History:  Diagnosis Date  . Arthritis   . COPD (chronic obstructive pulmonary disease) ([place])   . Hypertension   . Prostate disorder      PAST SURGICAL HISTORY:   Past Surgical History:  Procedure Laterality Date  . EYE SURGERY Left      SOCIAL HISTORY:   Social History   Tobacco Use  . Smoking status: Current Every Day Smoker    Packs/day: 0.50  . Smokeless tobacco: Never Used  Substance Use Topics  . Alcohol use: Not Currently    Comment: beer on occasion     FAMILY HISTORY:   Family History  Problem Relation Age of Onset  . Heart disease Mother   . Osteoporosis Mother   . Heart disease Father   . Emphysema Father   . Heart disease Sister   . Emphysema Maternal Grandmother   . Heart disease Maternal Grandfather   . Osteoporosis Sister      DRUG ALLERGIES:   Allergies   Allergen Reactions  . Penicillins     MEDICATIONS AT HOME:   Prior to Admission medications   Medication Sig Start Date End Date Taking? Authorizing Provider  albuterol (VENTOLIN HFA) 108 (90 Base) MCG/ACT inhaler Inhale 2 puffs into the lungs every 6 (six) hours as needed for wheezing or shortness of breath. 05/31/19   Duffy Bruce, MD  buPROPion (WELLBUTRIN XL) 150 MG 24 hr tablet Take 1 tablet (150 mg total) by mouth daily. 04/28/19   [name], [name], NP  cloNIDine (CATAPRES) 0.1 MG tablet Take 1 tablet (0.1 mg total) by mouth 2 (two) times daily. 04/28/19   [name], [name], NP  finasteride (PROSCAR) 5 MG tablet Take 1 tablet (5 mg total) by mouth daily. 04/28/19   [name], [name], NP  HYDROcodone-acetaminophen (NORCO/VICODIN) 5-325 MG tablet Take 1 tablet by mouth every 6 (six) hours as needed for moderate pain. 07/20/19 08/19/19  [name], [name], NP  hydrocortisone (ANUSOL-HC) 2.5 % rectal cream Place 1 application rectally 2 (two) times daily. 07/29/19   [name], [name], NP  Ipratropium-Albuterol (COMBIVENT) 20-100 MCG/ACT AERS respimat Inhale 1 puff into the lungs every 6 (six) hours. 06/15/19   Johnn Hai, PA-C  lidocaine (LIDODERM) 5 % Place 1 patch onto the skin every 12 (twelve) hours. Remove & Discard patch within 12 hours  or as directed by MD 07/06/19 07/05/20  Blake Divine, MD  montelukast (SINGULAIR) 10 MG tablet Take 1 tablet (10 mg total) by mouth daily. 06/15/19 06/14/20  Johnn Hai, PA-C  omeprazole (PRILOSEC) 40 MG capsule Take 1 capsule (40 mg total) by mouth daily. 04/28/19   [name], [name], NP  oxybutynin (DITROPAN) 5 MG tablet Take 1 tablet (5 mg total) by mouth 2 (two) times daily. Patient not taking: Reported on 07/01/2019 04/28/19   Marnee Guarneri T, NP  tamsulosin (FLOMAX) 0.4 MG CAPS capsule Take 1 capsule (0.4 mg total) by mouth daily. 04/28/19   [name], [name], NP  tiotropium (SPIRIVA HANDIHALER) 18 MCG inhalation capsule Place 1 capsule (18  mcg total) into inhaler and inhale daily. 04/28/19   [name], [name], NP  triamcinolone cream (KENALOG) 0.1 % Apply 1 application topically 2 (two) times daily. 07/29/19   Marnee Guarneri T, NP    REVIEW OF SYSTEMS:  Review of Systems  Constitutional: Negative for chills, fever, malaise/fatigue and weight loss.  HENT: Negative for ear pain, hearing loss and tinnitus.        Lip and face swelling  Eyes: Negative for blurred vision, double vision, pain and redness.  Respiratory: Negative for cough, hemoptysis and shortness of breath.   Cardiovascular: Negative for chest pain, palpitations, orthopnea and leg swelling.  Gastrointestinal: Negative for abdominal pain, constipation, diarrhea, nausea and vomiting.  Genitourinary: Negative for dysuria, frequency and hematuria.  Musculoskeletal: Negative for back pain, joint pain and neck pain.  Skin:       No acne, rash, or lesions  Neurological: Negative for dizziness, tremors, focal weakness and weakness.  Endo/Heme/Allergies: Negative for polydipsia. Does not bruise/bleed easily.  Psychiatric/Behavioral: Negative for depression. The patient is not nervous/anxious and does not have insomnia.      VITAL SIGNS:   Vitals:   08/07/19 2313 08/07/19 2314 08/07/19 2330  BP: (!) 128/98  (!) 159/95  Pulse: 93  77  Resp: (!) 28  (!) 25  Temp: 98.3 F (36.8 C)    TempSrc: Axillary    SpO2: 99%  98%  Weight:  81.6 kg   Height:  6' (1.829 m)    Wt Readings from Last 3 Encounters:  08/07/19 81.6 kg  07/06/19 77.1 kg  07/01/19 77.1 kg    PHYSICAL EXAMINATION:  Physical Exam  Vitals reviewed. Constitutional: He is oriented to person, place, and time. He appears well-developed and well-nourished. No distress.  HENT:  Head: Normocephalic and atraumatic.  Mouth/Throat: Oropharynx is clear and moist.  Lip and face swelling, no posterior pharyngeal involvement  Eyes: Pupils are equal, round, and reactive to light. Conjunctivae and EOM are  normal. No scleral icterus.  Neck: Normal range of motion. Neck supple. No JVD present. No thyromegaly present.  Cardiovascular: Normal rate, regular rhythm and intact distal pulses. Exam reveals no gallop and no friction rub.  No murmur heard. Respiratory: Effort normal and breath sounds normal. No respiratory distress. He has no wheezes. He has no rales.  GI: Soft. Bowel sounds are normal. He exhibits no distension. There is no abdominal tenderness.  Musculoskeletal: Normal range of motion.        General: No edema.     Comments: No arthritis, no gout  Lymphadenopathy:    He has no cervical adenopathy.  Neurological: He is alert and oriented to person, place, and time. No cranial nerve deficit.  No dysarthria, no aphasia  Skin: Skin is warm and dry. No rash  noted. No erythema.  Psychiatric: He has a normal mood and affect. His behavior is normal. Judgment and thought content normal.    LABORATORY PANEL:   CBC Recent Labs  Lab 08/07/19 2323  WBC 21.3*  HGB 16.8  HCT 48.8  PLT 413*   ------------------------------------------------------------------------------------------------------------------  Chemistries  Recent Labs  Lab 08/07/19 2323  NA 134*  K 4.1  CL 99  CO2 25  GLUCOSE 129*  BUN 13  CREATININE 0.92  CALCIUM 9.2   ------------------------------------------------------------------------------------------------------------------  Cardiac Enzymes No results for input(s): TROPONINI in the last 168 hours. ------------------------------------------------------------------------------------------------------------------  RADIOLOGY:  No results found.  EKG:   Orders placed or performed during the hospital encounter of 08/07/19  . ED EKG  . ED EKG  . EKG 12-Lead  . EKG 12-Lead    IMPRESSION AND PLAN:  Principal Problem:   Angioedema -admit to stepdown unit for close monitoring.  Patient is maintaining his airway at this time.  Continue steroids and H2  blockers.  DC lisinopril and added to his allergy list. Active Problems:   Essential hypertension -continue home meds   COPD (chronic obstructive pulmonary disease) (HCC) -home medications   GERD (gastroesophageal reflux disease) -home dose PPI  Chart review performed and case discussed with ED provider. Labs, imaging and/or ECG reviewed by provider and discussed with patient/family. Management plans discussed with the patient and/or family.  COVID-19 status: Pending  DVT PROPHYLAXIS: SubQ lovenox   GI PROPHYLAXIS:  PPI  ADMISSION STATUS: Inpatient     CODE STATUS: Full  TOTAL CRITICAL CARE TIME TAKING CARE OF THIS PATIENT: 50 minutes.   This patient was evaluated in the context of the global COVID-19 pandemic, which necessitated consideration that the patient might be at risk for infection with the SARS-CoV-2 virus that causes COVID-19. Institutional protocols and algorithms that pertain to the evaluation of patients at risk for COVID-19 are in a state of rapid change based on information released by regulatory bodies including the CDC and federal and state organizations. These policies and algorithms were followed to the best of this provider's knowledge to date during the patient's care at this facility.  Ethlyn Daniels 08/07/2019, 11:58 PM  [facility]  Office  [phone]  CC: Primary care physician; [name], NP  Note:  This document was prepared using Dragon voice recognition software and may include unintentional dictation errors.

## 2019-08-07 NOTE — ED Provider Notes (Signed)
[facility] Emergency Department Provider Note   ____________________________________________   First MD Initiated Contact with Patient 08/07/19 2319     (approximate)  I have reviewed the triage vital signs and the nursing notes.   HISTORY  Chief Complaint Angioedema    HPI Luke Briggs. is a 58 y.o. male who presents to the ED from home with a chief complaint of face, tongue and lip swelling.  Started around 9 PM after he ate some shellfish which he has done previously.  Patient is also taking lisinopril for hypertension.  Took 50 mg oral Benadryl prior to arrival without effect.  Denies chest pain, shortness of breath, abdominal pain, nausea, vomiting, diarrhea.       Past Medical History:  Diagnosis Date  . Arthritis   . COPD (chronic obstructive pulmonary disease) ([place])   . Hypertension   . Prostate disorder     Patient Active Problem List   Diagnosis Date Noted  . Angioedema 08/07/2019  . Bilateral primary osteoarthritis of knee 07/01/2019  . Primary osteoarthritis of right knee 07/01/2019  . Primary osteoarthritis of left knee 07/01/2019  . Drug-seeking behavior 07/01/2019  . COPD (chronic obstructive pulmonary disease) ([place]) 06/17/2019  . Controlled substance agreement signed 05/05/2019  . Chronic pain syndrome 04/28/2019  . BPH (benign prostatic hyperplasia) 04/28/2019  . Eczema 04/28/2019  . Essential hypertension 04/28/2019  . GERD (gastroesophageal reflux disease) 04/28/2019  . Schizophrenia ([place]) 04/28/2019  . Chronic post-traumatic stress disorder (PTSD) 04/28/2019  . Hemorrhoids 04/28/2019  . Nicotine dependence, cigarettes, w unsp disorders 04/28/2019  . History of alcohol dependence ([place]) 04/28/2019    Past Surgical History:  Procedure Laterality Date  . EYE SURGERY Left     Prior to Admission medications   Medication Sig Start Date End Date Taking? Authorizing Provider  albuterol (VENTOLIN HFA) 108 (90 Base)  MCG/ACT inhaler Inhale 2 puffs into the lungs every 6 (six) hours as needed for wheezing or shortness of breath. 05/31/19  Yes Duffy Bruce, MD  buPROPion (WELLBUTRIN XL) 150 MG 24 hr tablet Take 1 tablet (150 mg total) by mouth daily. 04/28/19  Yes [name], [name] T, NP  cloNIDine (CATAPRES) 0.1 MG tablet Take 1 tablet (0.1 mg total) by mouth 2 (two) times daily. 04/28/19  Yes [name], [name] T, NP  finasteride (PROSCAR) 5 MG tablet Take 1 tablet (5 mg total) by mouth daily. 04/28/19  Yes [name], [name] T, NP  HYDROcodone-acetaminophen (NORCO/VICODIN) 5-325 MG tablet Take 1 tablet by mouth every 6 (six) hours as needed for moderate pain. 07/20/19 08/19/19 Yes [name], [name] T, NP  hydrocortisone (ANUSOL-HC) 2.5 % rectal cream Place 1 application rectally 2 (two) times daily. 07/29/19  Yes [name], [name] T, NP  Ipratropium-Albuterol (COMBIVENT) 20-100 MCG/ACT AERS respimat Inhale 1 puff into the lungs every 6 (six) hours. 06/15/19  Yes Letitia Neri L, PA-C  lidocaine (LIDODERM) 5 % Place 1 patch onto the skin every 12 (twelve) hours. Remove & Discard patch within 12 hours or as directed by MD 07/06/19 07/05/20 Yes Blake Divine, MD  montelukast (SINGULAIR) 10 MG tablet Take 1 tablet (10 mg total) by mouth daily. 06/15/19 06/14/20 Yes Johnn Hai, PA-C  omeprazole (PRILOSEC) 40 MG capsule Take 1 capsule (40 mg total) by mouth daily. 04/28/19  Yes [name], [name] T, NP  tamsulosin (FLOMAX) 0.4 MG CAPS capsule Take 1 capsule (0.4 mg total) by mouth daily. 04/28/19  Yes [name], [name] T, NP  tiotropium (SPIRIVA HANDIHALER) 18 MCG inhalation  capsule Place 1 capsule (18 mcg total) into inhaler and inhale daily. 04/28/19  Yes [name], [name] T, NP  triamcinolone cream (KENALOG) 0.1 % Apply 1 application topically 2 (two) times daily. 07/29/19  Yes [name], [name], NP    Allergies Lisinopril and Penicillins  Family History  Problem Relation Age of Onset  . Heart disease Mother   . Osteoporosis Mother    . Heart disease Father   . Emphysema Father   . Heart disease Sister   . Emphysema Maternal Grandmother   . Heart disease Maternal Grandfather   . Osteoporosis Sister     Social History Social History   Tobacco Use  . Smoking status: Current Every Day Smoker    Packs/day: 0.50  . Smokeless tobacco: Never Used  Substance Use Topics  . Alcohol use: Not Currently    Comment: beer on occasion  . Drug use: Never    Review of Systems  Constitutional: No fever/chills Eyes: No visual changes. ENT: Positive for angioedema.  No sore throat. Cardiovascular: Denies chest pain. Respiratory: Denies shortness of breath. Gastrointestinal: No abdominal pain.  No nausea, no vomiting.  No diarrhea.  No constipation. Genitourinary: Negative for dysuria. Musculoskeletal: Negative for back pain. Skin: Negative for rash. Neurological: Negative for headaches, focal weakness or numbness.   ____________________________________________   PHYSICAL EXAM:  VITAL SIGNS: ED Triage Vitals  Enc Vitals Group     BP 08/07/19 2313 (!) 128/98     Pulse Rate 08/07/19 2313 93     Resp 08/07/19 2313 (!) 28     Temp 08/07/19 2313 98.3 F (36.8 C)     Temp Source 08/07/19 2313 Axillary     SpO2 08/07/19 2313 99 %     Weight 08/07/19 2314 180 lb (81.6 kg)     Height 08/07/19 2314 6' (1.829 m)     Head Circumference --      Peak Flow --      Pain Score 08/07/19 2314 0     Pain Loc --      Pain Edu? --      Excl. in [place]? --     Constitutional: Alert and oriented. Well appearing and in moderate acute distress. Eyes: Conjunctivae are normal. PERRL. EOMI. Head: Atraumatic.  Right cheek and lower face swollen. Nose: No congestion/rhinnorhea. Mouth/Throat: Mucous membranes are moist.  Right-sided tongue and lip severe angioedema.  Difficulty tolerating secretions.  Phonation muffled.  Posterior oropharynx intact without edema. Neck: No stridor.  No palpable mass. Cardiovascular: Normal rate, regular  rhythm. Grossly normal heart sounds.  Good peripheral circulation. Respiratory: Increased respiratory effort.  No retractions. Lungs CTAB. Gastrointestinal: Soft and nontender. No distention. No abdominal bruits. No CVA tenderness. Musculoskeletal: No lower extremity tenderness nor edema.  No joint effusions. Neurologic:  Normal speech and language. No gross focal neurologic deficits are appreciated.  Skin:  Skin is warm, dry and intact. No rash noted.  No urticaria. Psychiatric: Mood and affect are normal. Speech and behavior are normal.  ____________________________________________   LABS (all labs ordered are listed, but only abnormal results are displayed)  Labs Reviewed  CBC WITH DIFFERENTIAL/PLATELET - Abnormal; Notable for the following components:      Result Value   WBC 21.3 (*)    Platelets 413 (*)    Neutro Abs 19.0 (*)    Monocytes Absolute 1.2 (*)    Abs Immature Granulocytes 0.10 (*)    All other components within normal limits  BASIC METABOLIC PANEL - Abnormal;  Notable for the following components:   Sodium 134 (*)    Glucose, Bld 129 (*)    All other components within normal limits  URINALYSIS, COMPLETE (UACMP) WITH MICROSCOPIC - Abnormal; Notable for the following components:   Color, Urine YELLOW (*)    APPearance CLEAR (*)    Ketones, ur 5 (*)    All other components within normal limits  GLUCOSE, CAPILLARY - Abnormal; Notable for the following components:   Glucose-Capillary 110 (*)    All other components within normal limits  SARS CORONAVIRUS 2 (HOSPITAL ORDER, [place] [facility])  MRSA PCR SCREENING  BASIC METABOLIC PANEL  CBC  TROPONIN I (HIGH SENSITIVITY)   ____________________________________________  EKG  ED ECG REPORT I, SUNG,JADE J, the attending physician, personally viewed and interpreted this ECG.   Date: 08/07/2019  EKG Time: 2320  Rate: 82  Rhythm: normal EKG, normal sinus rhythm  Axis: Normal  Intervals:none   ST&T Change: Nonspecific  ____________________________________________  RADIOLOGY  ED MD interpretation: No acute cardiopulmonary process  Official radiology report(s): Dg Chest Port 1 View  Result Date: 08/07/2019 CLINICAL DATA:  Angioedema. EXAM: PORTABLE CHEST 1 VIEW COMPARISON:  May 31, 2019 FINDINGS: The heart size and mediastinal contours are within normal limits. Both lungs are clear. The visualized skeletal structures are unremarkable. IMPRESSION: No active disease. Electronically Signed   By: Constance Holster M.D.   On: 08/07/2019 23:56    ____________________________________________   PROCEDURES  Procedure(s) performed (including Critical Care):  Procedures  CRITICAL CARE Performed by: Paulette Blanch   Total critical care time: 45 minutes  Critical care time was exclusive of separately billable procedures and treating other patients.  Critical care was necessary to treat or prevent imminent or life-threatening deterioration.  Critical care was time spent personally by me on the following activities: development of treatment plan with patient and/or surrogate as well as nursing, discussions with consultants, evaluation of patient's response to treatment, examination of patient, obtaining history from patient or surrogate, ordering and performing treatments and interventions, ordering and review of laboratory studies, ordering and review of radiographic studies, pulse oximetry and re-evaluation of patient's condition. ____________________________________________   INITIAL IMPRESSION / ASSESSMENT AND PLAN / ED COURSE  As part of my medical decision making, I reviewed the following data within the [place] History obtained from family, Nursing notes reviewed and incorporated, Labs reviewed, EKG interpreted, Old chart reviewed, Radiograph reviewed, Discussed with admitting physician and Notes from prior ED visits     Vishan Gallerani. was evaluated  in Emergency Department on 08/08/2019 for the symptoms described in the history of present illness. He was evaluated in the context of the global COVID-19 pandemic, which necessitated consideration that the patient might be at risk for infection with the SARS-CoV-2 virus that causes COVID-19. Institutional protocols and algorithms that pertain to the evaluation of patients at risk for COVID-19 are in a state of rapid change based on information released by regulatory bodies including the CDC and federal and state organizations. These policies and algorithms were followed during the patient's care in the ED.    59 year old male on lisinopril who presents with angioedema after eating shellfish.  IM epinephrine administer immediately upon patient's arrival to the treatment room.  125 mg IV Solu-Medrol, 25 mg IV Benadryl and 20 mg IV Pepcid as well as 1 L IV normal saline administered.  Will obtain basic lab work, COVID-19 swab in anticipation for admission.   Clinical Course  as of Aug 07 506  Sat Aug 07, 2019  2327 Reexamined: Patient better after given suction so he can self suction.  Reexamined posterior oropharynx which remains nonedematous.   [JS]  2342 Leukocytosis noted.  Feel this is likely secondary to acute stress reaction; however, will obtain chest x-ray and urinalysis.   [JS]  2343 Reassessed patient at bedside: No change in posterior oropharynx.  Will add additional Benadryl.  Patient tells me that he has been having some suprapubic discomfort and dysuria.  Will obtain urine when he can provide it.  Discussed with hospitalist Dr. Jannifer Franklin who will evaluate patient in the emergency department for admission.   [JS]    Clinical Course User Index [JS] Paulette Blanch, MD     ____________________________________________   FINAL CLINICAL IMPRESSION(S) / ED DIAGNOSES  Final diagnoses:  Angioedema, initial encounter  Anaphylaxis, initial encounter     ED Discharge Orders    None        Note:  This document was prepared using Dragon voice recognition software and may include unintentional dictation errors.   Paulette Blanch, MD 08/08/19 [phone]

## 2019-08-07 NOTE — ED Triage Notes (Signed)
Pt arrives POV to triage with c/o angioedema which started around 2100. Pt states that he ate some seafood and and thought that it may be the seafood. Pt is on Lisinopril.

## 2019-08-08 ENCOUNTER — Encounter: Payer: Self-pay | Admitting: Adult Health

## 2019-08-08 DIAGNOSIS — T783XXA Angioneurotic edema, initial encounter: Secondary | ICD-10-CM | POA: Diagnosis not present

## 2019-08-08 DIAGNOSIS — J449 Chronic obstructive pulmonary disease, unspecified: Secondary | ICD-10-CM | POA: Diagnosis not present

## 2019-08-08 DIAGNOSIS — K219 Gastro-esophageal reflux disease without esophagitis: Secondary | ICD-10-CM

## 2019-08-08 DIAGNOSIS — I10 Essential (primary) hypertension: Secondary | ICD-10-CM

## 2019-08-08 DIAGNOSIS — T464X5A Adverse effect of angiotensin-converting-enzyme inhibitors, initial encounter: Secondary | ICD-10-CM

## 2019-08-08 LAB — URINALYSIS, COMPLETE (UACMP) WITH MICROSCOPIC
Bacteria, UA: NONE SEEN
Bilirubin Urine: NEGATIVE
Glucose, UA: NEGATIVE mg/dL
Hgb urine dipstick: NEGATIVE
Ketones, ur: 5 mg/dL — AB
Leukocytes,Ua: NEGATIVE
Nitrite: NEGATIVE
Protein, ur: NEGATIVE mg/dL
Specific Gravity, Urine: 1.013 (ref 1.005–1.030)
Squamous Epithelial / HPF: NONE SEEN (ref 0–5)
pH: 6 (ref 5.0–8.0)

## 2019-08-08 LAB — CBC
HCT: 44.7 % (ref 39.0–52.0)
Hemoglobin: 15.4 g/dL (ref 13.0–17.0)
MCH: 30.8 pg (ref 26.0–34.0)
MCHC: 34.5 g/dL (ref 30.0–36.0)
MCV: 89.4 fL (ref 80.0–100.0)
Platelets: 380 10*3/uL (ref 150–400)
RBC: 5 MIL/uL (ref 4.22–5.81)
RDW: 12.9 % (ref 11.5–15.5)
WBC: 16.7 10*3/uL — ABNORMAL HIGH (ref 4.0–10.5)
nRBC: 0 % (ref 0.0–0.2)

## 2019-08-08 LAB — TYPE AND SCREEN
ABO/RH(D): O NEG
Antibody Screen: NEGATIVE

## 2019-08-08 LAB — ABO/RH: ABO/RH(D): O NEG

## 2019-08-08 LAB — MRSA PCR SCREENING: MRSA by PCR: POSITIVE — AB

## 2019-08-08 LAB — BASIC METABOLIC PANEL
Anion gap: 10 (ref 5–15)
BUN: 12 mg/dL (ref 6–20)
CO2: 21 mmol/L — ABNORMAL LOW (ref 22–32)
Calcium: 8.7 mg/dL — ABNORMAL LOW (ref 8.9–10.3)
Chloride: 104 mmol/L (ref 98–111)
Creatinine, Ser: 0.78 mg/dL (ref 0.61–1.24)
GFR calc Af Amer: >60 mL/min (ref >60)
GFR calc non Af Amer: >60 mL/min (ref >60)
Glucose, Bld: 135 mg/dL — ABNORMAL HIGH (ref 70–99)
Potassium: 4.2 mmol/L (ref 3.5–5.1)
Sodium: 135 mmol/L (ref 135–145)

## 2019-08-08 LAB — TROPONIN I (HIGH SENSITIVITY): Troponin I (High Sensitivity): 8 ng/L (ref <18)

## 2019-08-08 LAB — SARS CORONAVIRUS 2 BY RT PCR (HOSPITAL ORDER, PERFORMED IN ~~LOC~~ HOSPITAL LAB): SARS Coronavirus 2: NEGATIVE

## 2019-08-08 LAB — GLUCOSE, CAPILLARY: Glucose-Capillary: 110 mg/dL — ABNORMAL HIGH (ref 70–99)

## 2019-08-08 MED ORDER — METHYLPREDNISOLONE SODIUM SUCC 125 MG IJ SOLR: 60.0000 mg | Freq: Every day | INTRAMUSCULAR | Status: DC

## 2019-08-08 MED ORDER — DEXAMETHASONE SODIUM PHOSPHATE 4 MG/ML IJ SOLN
4.0000 mg | INTRAMUSCULAR | Status: AC
  Administered 2019-08-08: 4 mg via INTRAVENOUS

## 2019-08-08 MED ORDER — LORAZEPAM 2 MG/ML IJ SOLN
1.0000 mg | INTRAMUSCULAR | Status: DC | PRN
  Administered 2019-08-08: 2 mg via INTRAVENOUS
  Filled 2019-08-08: qty 1

## 2019-08-08 MED ORDER — LABETALOL HCL 5 MG/ML IV SOLN
10.0000 mg | INTRAVENOUS | Status: DC | PRN
  Administered 2019-08-08: 10 mg via INTRAVENOUS
  Filled 2019-08-08: qty 4

## 2019-08-08 MED ORDER — IPRATROPIUM-ALBUTEROL 0.5-2.5 (3) MG/3ML IN SOLN
3.0000 mL | Freq: Four times a day (QID) | RESPIRATORY_TRACT | Status: DC
  Administered 2019-08-08 – 2019-08-09 (×5): 3 mL via RESPIRATORY_TRACT
  Filled 2019-08-08 (×5): qty 3

## 2019-08-08 MED ORDER — FOLIC ACID 1 MG PO TABS
1.0000 mg | ORAL_TABLET | Freq: Every day | ORAL | Status: DC
  Administered 2019-08-08 – 2019-08-09 (×2): 1 mg via ORAL
  Filled 2019-08-08 (×2): qty 1

## 2019-08-08 MED ORDER — ACETAMINOPHEN 325 MG PO TABS: 650.0000 mg | ORAL_TABLET | Freq: Four times a day (QID) | ORAL | Status: DC | PRN

## 2019-08-08 MED ORDER — SODIUM CHLORIDE 0.9% IV SOLUTION: Freq: Once | INTRAVENOUS | Status: DC

## 2019-08-08 MED ORDER — CLONIDINE HCL 0.1 MG PO TABS: 0.1000 mg | ORAL_TABLET | Freq: Two times a day (BID) | ORAL | Status: DC

## 2019-08-08 MED ORDER — THIAMINE HCL 100 MG/ML IJ SOLN: 100.0000 mg | Freq: Every day | INTRAMUSCULAR | Status: DC

## 2019-08-08 MED ORDER — EPINEPHRINE 0.3 MG/0.3ML IJ SOAJ
0.3000 mg | INTRAMUSCULAR | Status: DC | PRN
  Filled 2019-08-08: qty 0.3

## 2019-08-08 MED ORDER — PANTOPRAZOLE SODIUM 40 MG PO TBEC: 40.0000 mg | DELAYED_RELEASE_TABLET | Freq: Every day | ORAL | Status: DC

## 2019-08-08 MED ORDER — VITAMIN B-1 100 MG PO TABS
100.0000 mg | ORAL_TABLET | Freq: Every day | ORAL | Status: DC
  Administered 2019-08-08 – 2019-08-09 (×2): 100 mg via ORAL
  Filled 2019-08-08: qty 1

## 2019-08-08 MED ORDER — EPINEPHRINE 1 MG/10ML IJ SOSY
PREFILLED_SYRINGE | INTRAMUSCULAR | Status: AC
  Filled 2019-08-08: qty 10

## 2019-08-08 MED ORDER — DIPHENHYDRAMINE HCL 50 MG/ML IJ SOLN
25.0000 mg | Freq: Four times a day (QID) | INTRAMUSCULAR | Status: DC
  Administered 2019-08-08 – 2019-08-09 (×5): 25 mg via INTRAVENOUS
  Filled 2019-08-08 (×5): qty 1

## 2019-08-08 MED ORDER — HYDROCODONE-ACETAMINOPHEN 5-325 MG PO TABS
1.0000 | ORAL_TABLET | Freq: Four times a day (QID) | ORAL | Status: DC | PRN
  Administered 2019-08-08 – 2019-08-09 (×2): 1 via ORAL
  Filled 2019-08-08 (×2): qty 1

## 2019-08-08 MED ORDER — ACETAMINOPHEN 650 MG RE SUPP: 650.0000 mg | Freq: Four times a day (QID) | RECTAL | Status: DC | PRN

## 2019-08-08 MED ORDER — ENOXAPARIN SODIUM 40 MG/0.4ML ~~LOC~~ SOLN
40.0000 mg | Freq: Every day | SUBCUTANEOUS | Status: DC
  Administered 2019-08-08 (×2): 40 mg via SUBCUTANEOUS
  Filled 2019-08-08 (×2): qty 0.4

## 2019-08-08 MED ORDER — ADULT MULTIVITAMIN W/MINERALS CH
1.0000 | ORAL_TABLET | Freq: Every day | ORAL | Status: DC
  Administered 2019-08-08 – 2019-08-09 (×2): 1 via ORAL
  Filled 2019-08-08 (×2): qty 1

## 2019-08-08 MED ORDER — EPINEPHRINE 0.3 MG/0.3ML IJ SOAJ
0.3000 mg | INTRAMUSCULAR | Status: AC
  Administered 2019-08-08: 0.3 mg via INTRAMUSCULAR
  Filled 2019-08-08: qty 0.3

## 2019-08-08 MED ORDER — C1 ESTERASE INHIBITOR (HUMAN) 500 UNITS IV KIT
20.0000 [IU]/kg | PACK | Freq: Once | INTRAVENOUS | Status: AC
  Administered 2019-08-08: 1500 [IU] via INTRAVENOUS
  Filled 2019-08-08: qty 1500

## 2019-08-08 MED ORDER — SODIUM CHLORIDE 0.9 % IV SOLN: INTRAVENOUS | Status: DC

## 2019-08-08 MED ORDER — FENTANYL 12 MCG/HR TD PT72
1.0000 | MEDICATED_PATCH | TRANSDERMAL | Status: DC
  Administered 2019-08-08: 1 via TRANSDERMAL
  Filled 2019-08-08: qty 1

## 2019-08-08 MED ORDER — METHYLPREDNISOLONE SODIUM SUCC 125 MG IJ SOLR
60.0000 mg | Freq: Two times a day (BID) | INTRAMUSCULAR | Status: DC
  Administered 2019-08-09: 60 mg via INTRAVENOUS
  Filled 2019-08-08: qty 2

## 2019-08-08 MED ORDER — SODIUM CHLORIDE 0.9 % IV SOLN: INTRAVENOUS | Status: DC | PRN

## 2019-08-08 MED ORDER — VITAMIN B-1 100 MG PO TABS
100.0000 mg | ORAL_TABLET | Freq: Every day | ORAL | Status: DC
  Filled 2019-08-08: qty 1

## 2019-08-08 MED ORDER — LORAZEPAM 1 MG PO TABS: 1.0000 mg | ORAL_TABLET | ORAL | Status: DC | PRN

## 2019-08-08 MED ORDER — BUPROPION HCL ER (XL) 150 MG PO TB24
150.0000 mg | ORAL_TABLET | Freq: Every day | ORAL | Status: DC
  Administered 2019-08-08 – 2019-08-09 (×2): 150 mg via ORAL
  Filled 2019-08-08 (×3): qty 1

## 2019-08-08 MED ORDER — FAMOTIDINE IN NACL 20-0.9 MG/50ML-% IV SOLN
20.0000 mg | Freq: Two times a day (BID) | INTRAVENOUS | Status: DC
  Administered 2019-08-08 (×2): 20 mg via INTRAVENOUS
  Filled 2019-08-08 (×2): qty 50

## 2019-08-08 MED ORDER — TAMSULOSIN HCL 0.4 MG PO CAPS: 0.4000 mg | ORAL_CAPSULE | Freq: Every day | ORAL | Status: DC

## 2019-08-08 MED ORDER — CLONIDINE HCL 0.1 MG PO TABS
0.2000 mg | ORAL_TABLET | Freq: Two times a day (BID) | ORAL | Status: DC
  Administered 2019-08-08 – 2019-08-09 (×2): 0.2 mg via ORAL
  Filled 2019-08-08 (×2): qty 2

## 2019-08-08 MED ORDER — FINASTERIDE 5 MG PO TABS: 5.0000 mg | ORAL_TABLET | Freq: Every day | ORAL | Status: DC

## 2019-08-08 MED ORDER — ONDANSETRON HCL 4 MG PO TABS: 4.0000 mg | ORAL_TABLET | Freq: Four times a day (QID) | ORAL | Status: DC | PRN

## 2019-08-08 MED ORDER — CLONIDINE HCL 0.2 MG/24HR TD PTWK
0.2000 mg | MEDICATED_PATCH | TRANSDERMAL | Status: DC
  Administered 2019-08-08: 0.2 mg via TRANSDERMAL
  Filled 2019-08-08: qty 1

## 2019-08-08 MED ORDER — THIAMINE HCL 100 MG/ML IJ SOLN
100.0000 mg | Freq: Every day | INTRAMUSCULAR | Status: DC
  Filled 2019-08-08: qty 2

## 2019-08-08 MED ORDER — ONDANSETRON HCL 4 MG/2ML IJ SOLN: 4.0000 mg | Freq: Four times a day (QID) | INTRAMUSCULAR | Status: DC | PRN

## 2019-08-08 MED ORDER — METHYLPREDNISOLONE SODIUM SUCC 125 MG IJ SOLR
60.0000 mg | Freq: Three times a day (TID) | INTRAMUSCULAR | Status: DC
  Administered 2019-08-08: 60 mg via INTRAVENOUS
  Filled 2019-08-08 (×2): qty 2

## 2019-08-08 MED ORDER — AMLODIPINE BESYLATE 10 MG PO TABS: 10.0000 mg | ORAL_TABLET | Freq: Every day | ORAL | Status: DC

## 2019-08-08 MED ORDER — NICOTINE 21 MG/24HR TD PT24
21.0000 mg | MEDICATED_PATCH | Freq: Every day | TRANSDERMAL | Status: DC
  Administered 2019-08-08: 21 mg via TRANSDERMAL
  Filled 2019-08-08: qty 1

## 2019-08-08 MED ORDER — CHLORHEXIDINE GLUCONATE CLOTH 2 % EX PADS
6.0000 | MEDICATED_PAD | Freq: Every day | CUTANEOUS | Status: DC
  Filled 2019-08-08: qty 6

## 2019-08-08 MED ORDER — DIPHENHYDRAMINE HCL 50 MG/ML IJ SOLN
25.0000 mg | INTRAMUSCULAR | Status: AC
  Administered 2019-08-08: 25 mg via INTRAVENOUS

## 2019-08-08 MED ORDER — TAMSULOSIN HCL 0.4 MG PO CAPS
0.4000 mg | ORAL_CAPSULE | Freq: Every day | ORAL | Status: DC
  Administered 2019-08-08: 0.4 mg via ORAL
  Filled 2019-08-08: qty 1

## 2019-08-08 MED ORDER — DEXAMETHASONE SODIUM PHOSPHATE 4 MG/ML IJ SOLN: 4.0000 mg | Freq: Four times a day (QID) | INTRAMUSCULAR | Status: DC

## 2019-08-08 MED ORDER — BUPROPION HCL ER (XL) 150 MG PO TB24
150.0000 mg | ORAL_TABLET | Freq: Every day | ORAL | Status: DC
  Filled 2019-08-08: qty 1

## 2019-08-08 NOTE — Consult Note (Signed)
PULMONARY / CRITICAL CARE MEDICINE  Name: Luke Briggs. MRN: [id number] DOB: May 29, 1961    LOS: 1  Referring Provider: Dr. Jannifer Franklin Reason for Referral: Angioedema Brief patient description: 58 year old male with a medical history as indicated below, current everyday smoker who presented with possible ACE inhibitor induced angioedema.  Currently maintaining airway  HPI: This is a 58 year old Caucasian male with a medical history as indicated below, current everyday smoker, currently on lisinopril for the last 20 years for hypertension, no known allergy to shellfish, and known allergy to penicillins who presented to the ED with complaints of tongue, lips and facial swelling.  He reported that symptoms started at approximately 8 PM after he ate some scallops.  He reports eating scallops and other seafood in the past without any issues.  At the ED, he was given Solu-Medrol, epinephrine, Benadryl and Pepcid.  He continued to maintain his airway but indicated that he did not want to be put him on the ventilator should he decompensate.  He is being admitted to the ICU for further management.  Past Medical History:  Diagnosis Date  . Arthritis   . COPD (chronic obstructive pulmonary disease) ([place])   . GERD (gastroesophageal reflux disease)   . Hemorrhoids   . Hypertension   . Prostate disorder   . PTSD (post-traumatic stress disorder)   . Schizophrenia ([place])    Per patient's report.   Past Surgical History:  Procedure Laterality Date  . EYE SURGERY Left    No current facility-administered medications on file prior to encounter.    Current Outpatient Medications on File Prior to Encounter  Medication Sig  . albuterol (VENTOLIN HFA) 108 (90 Base) MCG/ACT inhaler Inhale 2 puffs into the lungs every 6 (six) hours as needed for wheezing or shortness of breath.  [name] buPROPion (WELLBUTRIN XL) 150 MG 24 hr tablet Take 1 tablet (150 mg total) by mouth daily.  . cloNIDine (CATAPRES) 0.1 MG  tablet Take 1 tablet (0.1 mg total) by mouth 2 (two) times daily.  . finasteride (PROSCAR) 5 MG tablet Take 1 tablet (5 mg total) by mouth daily.  [name] HYDROcodone-acetaminophen (NORCO/VICODIN) 5-325 MG tablet Take 1 tablet by mouth every 6 (six) hours as needed for moderate pain.  . hydrocortisone (ANUSOL-HC) 2.5 % rectal cream Place 1 application rectally 2 (two) times daily.  . Ipratropium-Albuterol (COMBIVENT) 20-100 MCG/ACT AERS respimat Inhale 1 puff into the lungs every 6 (six) hours.  . lidocaine (LIDODERM) 5 % Place 1 patch onto the skin every 12 (twelve) hours. Remove & Discard patch within 12 hours or as directed by MD  . montelukast (SINGULAIR) 10 MG tablet Take 1 tablet (10 mg total) by mouth daily.  [name] omeprazole (PRILOSEC) 40 MG capsule Take 1 capsule (40 mg total) by mouth daily.  . tamsulosin (FLOMAX) 0.4 MG CAPS capsule Take 1 capsule (0.4 mg total) by mouth daily.  [name] tiotropium (SPIRIVA HANDIHALER) 18 MCG inhalation capsule Place 1 capsule (18 mcg total) into inhaler and inhale daily.  [name] triamcinolone cream (KENALOG) 0.1 % Apply 1 application topically 2 (two) times daily.    Allergies Allergies  Allergen Reactions  . Lisinopril Swelling    Reaction: angioedema  . Penicillins Rash    Reaction: Feels like skin is on fire Did it involve swelling of the face/tongue/throat, SOB, or low BP? No Did it involve sudden or severe rash/hives, skin peeling, or any reaction on the inside of your mouth or nose? No Did you need to seek medical  attention at a hospital or doctor's office? Yes When did it last happen?30 years ago If all above answers are "NO", may proceed with cephalosporin use.     Family History Family History  Problem Relation Age of Onset  . Heart disease Mother   . Osteoporosis Mother   . Heart disease Father   . Emphysema Father   . Heart disease Sister   . Emphysema Maternal Grandmother   . Heart disease Maternal Grandfather   . Osteoporosis Sister     Social History  reports that he has been smoking. He has been smoking about 0.50 packs per day. He has never used smokeless tobacco. He reports previous alcohol use. He reports that he does not use drugs.  Review Of Systems:   Constitutional: Negative for fever and chills.  HENT: Reports lips, tongue and facial swelling as well as difficulty swallowing Eyes: Negative for redness and visual disturbance.  Respiratory: Negative for shortness of breath and wheezing.  Cardiovascular: Negative for chest pain and palpitations.  Gastrointestinal: Negative  for nausea , vomiting and abdominal pain and  Loose stools Genitourinary: Reports dysuria and suprapubic pain Endocrine: Denies polyuria, polyphagia and heat intolerance Musculoskeletal: Negative for myalgias and arthralgias.  Skin: Negative for pallor and wound.  Neurological: Negative for dizziness and headaches   VITAL SIGNS: BP (!) 166/91   Pulse 81   Temp 97.9 F (36.6 C) (Oral)   Resp 12   Ht 5\' 10"  (1.778 m)   Wt 79 kg   SpO2 96%   BMI 24.99 kg/m   HEMODYNAMICS:    VENTILATOR SETTINGS:    INTAKE / OUTPUT: I/O last 3 completed shifts: In: 1050 [IV Piggyback:1050] Out: -   PHYSICAL EXAMINATION: General: Well-groomed, well-nourished, in no acute distress HEENT: Normocephalic and atraumatic, PERRLA, lips, face and tongue swollen, positive gag reflex, tonsils invisible Neuro: Alert and oriented x4, no focal deficits Cardiovascular: Apical pulse regular, S1-S2, no murmur regurg or gallop, +2 pulses bilaterally, no edema Lungs: Bilateral breath sounds, diminished in bilateral lower lung fields, no stridor or wheezing Abdomen: Nondistended, normal bowel sounds in all 4 quadrants, mild suprapubic tenderness on palpation Musculoskeletal: No joint deformities, positive range of motion Skin: Warm and dry  LABS:  BMET Recent Labs  Lab 08/07/19 2323 08/08/19 0436  NA 134* 135  K 4.1 4.2  CL 99 104  CO2 25 21*  BUN  13 12  CREATININE 0.92 0.78  GLUCOSE 129* 135*    Electrolytes Recent Labs  Lab 08/07/19 2323 08/08/19 0436  CALCIUM 9.2 8.7*    CBC Recent Labs  Lab 08/07/19 2323 08/08/19 0436  WBC 21.3* 16.7*  HGB 16.8 15.4  HCT 48.8 44.7  PLT 413* 380    Coag's No results for input(s): APTT, INR in the last 168 hours.  Sepsis Markers No results for input(s): LATICACIDVEN, PROCALCITON, O2SATVEN in the last 168 hours.  ABG No results for input(s): PHART, PCO2ART, PO2ART in the last 168 hours.  Liver Enzymes No results for input(s): AST, ALT, ALKPHOS, BILITOT, ALBUMIN in the last 168 hours.  Cardiac Enzymes No results for input(s): TROPONINI, PROBNP in the last 168 hours.  Glucose Recent Labs  Lab 08/08/19 0223  GLUCAP 110*    Imaging Dg Chest Port 1 View  Result Date: 08/07/2019 CLINICAL DATA:  Angioedema. EXAM: PORTABLE CHEST 1 VIEW COMPARISON:  May 31, 2019 FINDINGS: The heart size and mediastinal contours are within normal limits. Both lungs are clear. The visualized skeletal structures are unremarkable.  IMPRESSION: No active disease. Electronically Signed   By: Constance Holster M.D.   On: 08/07/2019 23:56   CULTURES: Urine culture  ANTIBIOTICS: None SIGNIFICANT EVENTS: 08/07/2019: Admitted  LINES/TUBES: Peripheral IV  DISCUSSION: 58 year old male presenting with ACE inhibitor induced angioedema  ASSESSMENT  ACE inhibitor induced angioedema Tobacco use disorder Hypertension COPD BPH  PLAN Monitor airway closely and continue to review options for tracheostomy with patient if needed DC lisinopril Clonidine for blood pressure control Decadron 4 mg given x1 Continue Benadryl, EpiPen, Pepcid and Solu-Medrol Resume all home medications except for lisinopril Fresh frozen plasma 1 unit x 1 ACE inhibitors added to allergy list  Best Practice: Code Status: Full code Diet: N.p.o. until airway swelling has resolved GI prophylaxis: Already on Pepcid VTE  prophylaxis: SCDs and Lovenox  FAMILY  - Updates: Patient updated on current treatment plan.  Will update with further changes in treatment plan.  Murad Staples S. Tukov-Yual ANP-BC Pulmonary and [place] Pager [phone] or [phone]  NB: This document was prepared using Dragon voice recognition software and may include unintentional dictation errors.    08/08/2019, 7:56 AM

## 2019-08-08 NOTE — ED Notes (Signed)
MD Willis at bedside. 

## 2019-08-08 NOTE — Progress Notes (Signed)
[facility] [place] NAME: Luke Briggs    MR#:  [id number]  DATE OF BIRTH:  07-05-1961  SUBJECTIVE:  CHIEF COMPLAINT:  Pt is feeling much better ,swelling of lips is improving  Denies any diff with swallowing  REVIEW OF SYSTEMS:  CONSTITUTIONAL: No fever, fatigue or weakness.  EYES: No blurred or double vision.  EARS, NOSE, AND THROAT: No tinnitus or ear pain. Swallowing ok  RESPIRATORY: No cough, shortness of breath, wheezing or hemoptysis.  CARDIOVASCULAR: No chest pain, orthopnea, edema.  GASTROINTESTINAL: No nausea, vomiting, diarrhea or abdominal pain.  GENITOURINARY: No dysuria, hematuria.  ENDOCRINE: No polyuria, nocturia,  HEMATOLOGY: No anemia, easy bruising or bleeding SKIN: No rash or lesion. MUSCULOSKELETAL: No joint pain or arthritis.   NEUROLOGIC: No tingling, numbness, weakness.  PSYCHIATRY: No anxiety or depression.   DRUG ALLERGIES:   Allergies  Allergen Reactions  . Lisinopril Swelling    Reaction: angioedema  . Penicillins Rash    Reaction: Feels like skin is on fire Did it involve swelling of the face/tongue/throat, SOB, or low BP? No Did it involve sudden or severe rash/hives, skin peeling, or any reaction on the inside of your mouth or nose? No Did you need to seek medical attention at a hospital or doctor's office? Yes When did it last happen?30 years ago If all above answers are "NO", may proceed with cephalosporin use.     VITALS:  Blood pressure (!) 166/89, pulse 69, temperature 98.6 F (37 C), temperature source Oral, resp. rate (!) 26, height 5\' 10"  (1.778 m), weight 79 kg, SpO2 95 %.  PHYSICAL EXAMINATION:  GENERAL:  58 y.o.-year-old patient lying in the bed with no acute distress.  EYES: Pupils equal, round, reactive to light and accommodation. No scleral icterus. Extraocular muscles intact.  HEENT: Head atraumatic, normocephalic. Oropharynx and nasopharynx clear. Lips with less edema   NECK:  Supple, no jugular venous distention. No thyroid enlargement, no tenderness.  LUNGS: Normal breath sounds bilaterally, no wheezing, rales,rhonchi or crepitation. No use of accessory muscles of respiration.  CARDIOVASCULAR: S1, S2 normal. No murmurs, rubs, or gallops.  ABDOMEN: Soft, nontender, nondistended. Bowel sounds present. EXTREMITIES: No pedal edema, cyanosis, or clubbing.  NEUROLOGIC: Cranial nerves II through XII are intact. Muscle strength 5/5 in all extremities. Sensation intact. Gait not checked.  PSYCHIATRIC: The patient is alert and oriented x 3.  SKIN: No obvious rash, lesion, or ulcer.    LABORATORY PANEL:   CBC Recent Labs  Lab 08/08/19 0436  WBC 16.7*  HGB 15.4  HCT 44.7  PLT 380   ------------------------------------------------------------------------------------------------------------------  Chemistries  Recent Labs  Lab 08/08/19 0436  NA 135  K 4.2  CL 104  CO2 21*  GLUCOSE 135*  BUN 12  CREATININE 0.78  CALCIUM 8.7*   ------------------------------------------------------------------------------------------------------------------  Cardiac Enzymes No results for input(s): TROPONINI in the last 168 hours. ------------------------------------------------------------------------------------------------------------------  RADIOLOGY:  Dg Chest Port 1 View  Result Date: 08/07/2019 CLINICAL DATA:  Angioedema. EXAM: PORTABLE CHEST 1 VIEW COMPARISON:  May 31, 2019 FINDINGS: The heart size and mediastinal contours are within normal limits. Both lungs are clear. The visualized skeletal structures are unremarkable. IMPRESSION: No active disease. Electronically Signed   By: Constance Holster M.D.   On: 08/07/2019 23:56    EKG:   Orders placed or performed during the hospital encounter of 08/07/19  . ED EKG  . ED EKG  . EKG 12-Lead  . EKG 12-Lead  ASSESSMENT AND PLAN:   Angioedema - from ACEI  Patient is maintaining his airway at this  time.   Continue steroids and H2 blockers.   [place] one time  DC lisinopril and added to his allergy list.    Essential hypertension -continue home meds and titrate prn     COPD (chronic obstructive pulmonary disease) (HCC) -home medications, inhalers prn     GERD (gastroesophageal reflux disease) - pepcid      All the records are reviewed and case discussed with Care Management/Social Workerr. Management plans discussed with the patient, family and they are in agreement.  CODE STATUS: fc   TOTAL TIME TAKING CARE OF THIS PATIENT: 36  minutes.   POSSIBLE D/C IN 2  DAYS, DEPENDING ON CLINICAL CONDITION.  Note: This dictation was prepared with Dragon dictation along with smaller phrase technology. Any transcriptional errors that result from this process are unintentional.   Nicholes Mango M.D on 08/08/2019 at 11:41 AM  Between 7am to 6pm - Pager - [phone] After 6pm go to [email or web address] - password [email or web address]  Office  [phone]  CC: Primary care physician; [name], NP

## 2019-08-08 NOTE — ED Notes (Signed)
ED TO INPATIENT HANDOFF REPORT  ED Nurse Name and Phone #: Wells Guiles [phone] Name/Age/Gender Luke Briggs. 58 y.o. male Room/Bed: ED02A/ED02A  Code Status   Code Status: Not on file  Home/SNF/Other Home Patient oriented to: self, place, time and situation Is this baseline? Yes   Triage Complete: Triage complete  Chief Complaint allergic reaction  Triage Note Pt arrives POV to triage with c/o angioedema which started around 2100. Pt states that he ate some seafood and and thought that it may be the seafood. Pt is on Lisinopril.    Allergies Allergies  Allergen Reactions  . Lisinopril Swelling    Reaction: angioedema  . Penicillins Rash    Reaction: Feels like skin is on fire Did it involve swelling of the face/tongue/throat, SOB, or low BP? No Did it involve sudden or severe rash/hives, skin peeling, or any reaction on the inside of your mouth or nose? No Did you need to seek medical attention at a hospital or doctor's office? Yes When did it last happen?30 years ago If all above answers are "NO", may proceed with cephalosporin use.     Level of Care/Admitting Diagnosis ED Disposition    ED Disposition Condition [facility] Area: [place] [[id number]]  Level of Care: Stepdown [14]  Covid Evaluation: Asymptomatic Screening Protocol (No Symptoms)  Diagnosis: Angioedema [id number]  Admitting Physician: Lance Coon [id number]  Attending Physician: Lance Coon [phone]  Estimated length of stay: past midnight tomorrow  Certification:: I certify this patient will need inpatient services for at least 2 midnights  PT Class (Do Not Modify): Inpatient [101]  PT Acc Code (Do Not Modify): Private [1]       B Medical/Surgery History Past Medical History:  Diagnosis Date  . Arthritis   . COPD (chronic obstructive pulmonary disease) ([place])   . Hypertension   . Prostate disorder    Past Surgical History:  Procedure Laterality  Date  . EYE SURGERY Left      A IV Location/Drains/Wounds Patient Lines/Drains/Airways Status   Active Line/Drains/Airways    Name:   Placement date:   Placement time:   Site:   Days:   Peripheral IV 08/07/19 Left Antecubital   08/07/19    2305    Antecubital   1          Intake/Output Last 24 hours No intake or output data in the 24 hours ending 08/08/19 0146  Labs/Imaging Results for orders placed or performed during the hospital encounter of 08/07/19 (from the past 48 hour(s))  CBC with Differential     Status: Abnormal   Collection Time: 08/07/19 11:23 PM  Result Value Ref Range   WBC 21.3 (H) 4.0 - 10.5 K/uL   RBC 5.43 4.22 - 5.81 MIL/uL   Hemoglobin 16.8 13.0 - 17.0 g/dL   HCT 48.8 39.0 - 52.0 %   MCV 89.9 80.0 - 100.0 fL   MCH 30.9 26.0 - 34.0 pg   MCHC 34.4 30.0 - 36.0 g/dL   RDW 12.9 11.5 - 15.5 %   Platelets 413 (H) 150 - 400 K/uL   nRBC 0.0 0.0 - 0.2 %   Neutrophils Relative % 88 %   Neutro Abs 19.0 (H) 1.7 - 7.7 K/uL   Lymphocytes Relative 4 %   Lymphs Abs 0.8 0.7 - 4.0 K/uL   Monocytes Relative 6 %   Monocytes Absolute 1.2 (H) 0.1 - 1.0 K/uL   Eosinophils Relative 1 %  Eosinophils Absolute 0.1 0.0 - 0.5 K/uL   Basophils Relative 0 %   Basophils Absolute 0.1 0.0 - 0.1 K/uL   Immature Granulocytes 1 %   Abs Immature Granulocytes 0.10 (H) 0.00 - 0.07 K/uL    Comment: Performed at [facility], [address] [place], [place] [id number]  Basic metabolic panel     Status: Abnormal   Collection Time: 08/07/19 11:23 PM  Result Value Ref Range   Sodium 134 (L) 135 - 145 mmol/L   Potassium 4.1 3.5 - 5.1 mmol/L   Chloride 99 98 - 111 mmol/L   CO2 25 22 - 32 mmol/L   Glucose, Bld 129 (H) 70 - 99 mg/dL   BUN 13 6 - 20 mg/dL   Creatinine, Ser 0.92 0.61 - 1.24 mg/dL   Calcium 9.2 8.9 - 10.3 mg/dL   GFR calc non Af Amer >60 >60 mL/min   GFR calc Af Amer >60 >60 mL/min   Anion gap 10 5 - 15    Comment: Performed at [facility], [address], [place], [place] [address]  SARS Coronavirus 2 [facility] order, Performed in [facility] hospital lab) Nasopharyngeal Nasopharyngeal Swab     Status: None   Collection Time: 08/07/19 11:23 PM   Specimen: Nasopharyngeal Swab  Result Value Ref Range   SARS Coronavirus 2 NEGATIVE NEGATIVE    Comment: (NOTE) If result is NEGATIVE SARS-CoV-2 target nucleic acids are NOT DETECTED. The SARS-CoV-2 RNA is generally detectable in upper and lower  respiratory specimens during the acute phase of infection. The lowest  concentration of SARS-CoV-2 viral copies this assay can detect is 250  copies / mL. A negative result does not preclude SARS-CoV-2 infection  and should not be used as the sole basis for treatment or other  patient management decisions.  A negative result may occur with  improper specimen collection / handling, submission of specimen other  than nasopharyngeal swab, presence of viral mutation(s) within the  areas targeted by this assay, and inadequate number of viral copies  (<250 copies / mL). A negative result must be combined with clinical  observations, patient history, and epidemiological information. If result is POSITIVE SARS-CoV-2 target nucleic acids are DETECTED. The SARS-CoV-2 RNA is generally detectable in upper and lower  respiratory specimens dur ing the acute phase of infection.  Positive  results are indicative of active infection with SARS-CoV-2.  Clinical  correlation with patient history and other diagnostic information is  necessary to determine patient infection status.  Positive results do  not rule out bacterial infection or co-infection with other viruses. If result is PRESUMPTIVE POSTIVE SARS-CoV-2 nucleic acids MAY BE PRESENT.   A presumptive positive result was obtained on the submitted specimen  and confirmed on repeat testing.  While 2019 novel coronavirus  (SARS-CoV-2) nucleic acids may be present in the submitted sample  additional  confirmatory testing may be necessary for epidemiological  and / or clinical management purposes  to differentiate between  SARS-CoV-2 and other Sarbecovirus currently known to infect humans.  If clinically indicated additional testing with an alternate test  methodology [id number]) is advised. The SARS-CoV-2 RNA is generally  detectable in upper and lower respiratory sp ecimens during the acute  phase of infection. The expected result is Negative. Fact Sheet for Patients:  StrictlyIdeas.no Fact Sheet for Healthcare Providers: BankingDealers.co.za This test is not yet approved or cleared by the [place] FDA and has been authorized for detection and/or diagnosis of SARS-CoV-2 by  FDA under an Emergency Use Authorization (EUA).  This EUA will remain in effect (meaning this test can be used) for the duration of the COVID-19 declaration under Section 564(b)(1) of the Act, 21 U.S.C. section 360bbb-3(b)(1), unless the authorization is terminated or revoked sooner. Performed at [facility], [place], [place] [address]   Troponin I (High Sensitivity)     Status: None   Collection Time: 08/07/19 11:23 PM  Result Value Ref Range   Troponin I (High Sensitivity) 8 <18 ng/L    Comment: (NOTE) Elevated high sensitivity troponin I (hsTnI) values and significant  changes across serial measurements may suggest ACS but many other  chronic and acute conditions are known to elevate hsTnI results.  Refer to the "Links" section for chest pain algorithms and additional  guidance. Performed at [facility], [address] [place], [place] [address]    Dg Chest Port 1 View  Result Date: 08/07/2019 CLINICAL DATA:  Angioedema. EXAM: PORTABLE CHEST 1 VIEW COMPARISON:  May 31, 2019 FINDINGS: The heart size and mediastinal contours are within normal limits. Both lungs are clear. The visualized skeletal structures are  unremarkable. IMPRESSION: No active disease. Electronically Signed   By: Constance Holster M.D.   On: 08/07/2019 23:56    Pending Labs Unresulted Labs (From admission, onward)    Start     Ordered   08/07/19 2343  Urinalysis, Complete w Microscopic  ONCE - STAT,   STAT     08/07/19 2342   Signed and Held  CBC  (enoxaparin (LOVENOX)    CrCl >/= 30 ml/min)  Once,   R    Comments: Baseline for enoxaparin therapy IF NOT ALREADY DRAWN.  Notify MD if PLT < 100 K.    Signed and Held   Signed and Held  Creatinine, serum  (enoxaparin (LOVENOX)    CrCl >/= 30 ml/min)  Once,   R    Comments: Baseline for enoxaparin therapy IF NOT ALREADY DRAWN.    Signed and Held   Signed and Held  Creatinine, serum  (enoxaparin (LOVENOX)    CrCl >/= 30 ml/min)  Weekly,   R    Comments: while on enoxaparin therapy    Signed and Held   Signed and Held  Basic metabolic panel  Tomorrow morning,   R     Signed and Held   Signed and Held  CBC  Tomorrow morning,   R     Signed and Held          Vitals/Pain Today's Vitals   08/07/19 2330 08/08/19 0000 08/08/19 0100 08/08/19 0130  BP: (!) 159/95 (!) 170/93 (!) 149/95 (!) 158/103  Pulse: 77 70 75 72  Resp: (!) 25 19 (!) 22 20  Temp:      TempSrc:      SpO2: 98% 99% 98% 97%  Weight:      Height:      PainSc:        Isolation Precautions No active isolations  Medications Medications  methylPREDNISolone sodium succinate (SOLU-MEDROL) 125 mg/2 mL injection 60 mg (has no administration in time range)  diphenhydrAMINE (BENADRYL) injection 25 mg (has no administration in time range)  EPINEPHrine (EPI-PEN) injection 0.3 mg (0.3 mg Intramuscular Given 08/07/19 2306)  diphenhydrAMINE (BENADRYL) injection 25 mg (25 mg Intravenous Given 08/07/19 2308)  methylPREDNISolone sodium succinate (SOLU-MEDROL) 125 mg/2 mL injection 125 mg (125 mg Intravenous Given 08/07/19 2308)  sodium chloride 0.9 % bolus 1,000 mL (0 mLs  Intravenous Stopped 08/08/19 0021)  famotidine  (PEPCID) IVPB 20 mg premix (0 mg Intravenous Stopped 08/07/19 2339)  diphenhydrAMINE (BENADRYL) injection 25 mg (25 mg Intravenous Given 08/07/19 2354)    Mobility walks Low fall risk   Focused Assessments angioedema   R Recommendations: See Admitting Provider Note  Report given to:   Additional Notes:

## 2019-08-09 LAB — MAGNESIUM: Magnesium: 2.2 mg/dL (ref 1.7–2.4)

## 2019-08-09 LAB — PREPARE FRESH FROZEN PLASMA: Unit division: 0

## 2019-08-09 LAB — BPAM FFP
Blood Product Expiration Date: 202009252359
Unit Type and Rh: 9500

## 2019-08-09 LAB — BASIC METABOLIC PANEL
Anion gap: 11 (ref 5–15)
BUN: 16 mg/dL (ref 6–20)
CO2: 23 mmol/L (ref 22–32)
Calcium: 8.6 mg/dL — ABNORMAL LOW (ref 8.9–10.3)
Chloride: 102 mmol/L (ref 98–111)
Creatinine, Ser: 0.96 mg/dL (ref 0.61–1.24)
GFR calc Af Amer: >60 mL/min (ref >60)
GFR calc non Af Amer: >60 mL/min (ref >60)
Glucose, Bld: 151 mg/dL — ABNORMAL HIGH (ref 70–99)
Potassium: 4 mmol/L (ref 3.5–5.1)
Sodium: 136 mmol/L (ref 135–145)

## 2019-08-09 LAB — C1 ESTERASE INHIBITOR: C1INH SerPl-mCnc: 42 mg/dL — ABNORMAL HIGH (ref 21–39)

## 2019-08-09 LAB — URINE CULTURE: Culture: NO GROWTH

## 2019-08-09 LAB — PHOSPHORUS: Phosphorus: 2.7 mg/dL (ref 2.5–4.6)

## 2019-08-09 MED ORDER — DIPHENHYDRAMINE HCL 25 MG PO CAPS: 25.0000 mg | ORAL_CAPSULE | Freq: Four times a day (QID) | ORAL | 0 refills | Status: DC | PRN

## 2019-08-09 MED ORDER — FOLIC ACID 1 MG PO TABS: 1.0000 mg | ORAL_TABLET | Freq: Every day | ORAL | 0 refills | Status: DC

## 2019-08-09 MED ORDER — PREDNISONE 10 MG (21) PO TBPK: 10.0000 mg | ORAL_TABLET | Freq: Every day | ORAL | 0 refills | Status: DC

## 2019-08-09 MED ORDER — CLONIDINE HCL 0.1 MG PO TABS: 0.2000 mg | ORAL_TABLET | Freq: Two times a day (BID) | ORAL | 0 refills | Status: DC

## 2019-08-09 MED ORDER — ADULT MULTIVITAMIN W/MINERALS CH: 1.0000 | ORAL_TABLET | Freq: Every day | ORAL | 0 refills | Status: DC

## 2019-08-09 MED ORDER — ACETAMINOPHEN 325 MG PO TABS: 650.0000 mg | ORAL_TABLET | Freq: Four times a day (QID) | ORAL | Status: DC | PRN

## 2019-08-09 MED ORDER — THIAMINE HCL 100 MG PO TABS: 100.0000 mg | ORAL_TABLET | Freq: Every day | ORAL | 0 refills | Status: DC

## 2019-08-09 MED ORDER — NICOTINE 21 MG/24HR TD PT24: 21.0000 mg | MEDICATED_PATCH | Freq: Every day | TRANSDERMAL | 0 refills | Status: DC

## 2019-08-09 MED ORDER — DIPHENHYDRAMINE HCL 25 MG PO CAPS: 25.0000 mg | ORAL_CAPSULE | Freq: Four times a day (QID) | ORAL | Status: DC | PRN

## 2019-08-09 MED ORDER — EPINEPHRINE 0.3 MG/0.3ML IJ SOAJ: 0.3000 mg | INTRAMUSCULAR | 1 refills | Status: DC | PRN

## 2019-08-09 MED ORDER — FAMOTIDINE 20 MG PO TABS: 20.0000 mg | ORAL_TABLET | Freq: Two times a day (BID) | ORAL | 1 refills | Status: DC

## 2019-08-09 NOTE — Discharge Summary (Signed)
[facility] [place] NAME: Luke Briggs    MR#:  [id number]  DATE OF BIRTH:  09/15/1961  DATE OF ADMISSION:  08/07/2019 ADMITTING PHYSICIAN: Lance Coon, MD  DATE OF DISCHARGE:  08/09/19 PRIMARY CARE PHYSICIAN: [name], NP    ADMISSION DIAGNOSIS:  Anaphylaxis, initial encounter [T78.2XXA] Angioedema, initial encounter [T78.3XXA]  DISCHARGE DIAGNOSIS:  Principal Problem:   Angioedema Active Problems:   Essential hypertension   GERD (gastroesophageal reflux disease)   COPD (chronic obstructive pulmonary disease) ([place])   SECONDARY DIAGNOSIS:   Past Medical History:  Diagnosis Date  . Arthritis   . COPD (chronic obstructive pulmonary disease) ([place])   . GERD (gastroesophageal reflux disease)   . Hemorrhoids   . Hypertension   . Prostate disorder   . PTSD (post-traumatic stress disorder)   . Schizophrenia ([place])    Per patient's report.    HOSPITAL COURSE:  Luke Briggs  is a 58 y.o. male who presents with chief complaint as above.  Patient presents the ED with a complaint of lip and face swelling.  He states this started around 8:00 in the evening.  He does state that he ate some seafood, some scallops.  However, he states that he is eaten scallops and other seafood and shellfish multiple times in the past, never had any reaction.  Patient is also on lisinopril and has been for the last 20 years or so per his report.  On evaluation in the ED he is found to have significant angioedema, though without posterior pharyngeal involvement.  He was given epinephrine, Solu-Medrol, Benadryl, Pepcid.  Hospitalist were called for admission  Hospital course  Angioedema - from ACEI Patient is maintaining his airway.  Clinically improved and back to her normal With steroids and H2 blockers.  Patient will be discharged with the steroids and Pepcid.  He has to take Benadryl as needed.  Patient should call 911 immediately and  come to the emergency department if this recurs in the near future Berinest given one time  DC lisinopril and added to his allergy list.  Patient understands that he cannot take any ACE inhibitors anymore  Essential hypertension -continue home meds and titrate prn.  Lisinopril discontinued, clonidine dose increased 0.2 mg p.o. twice daily   COPD (chronic obstructive pulmonary disease) ([place]) -home medications, inhalers prn   GERD (gastroesophageal reflux disease) - pepcid    DISCHARGE CONDITIONS:   stable  CONSULTS OBTAINED:     PROCEDURES  None   DRUG ALLERGIES:   Allergies  Allergen Reactions  . Lisinopril Swelling    Reaction: angioedema  . Penicillins Rash    Reaction: Feels like skin is on fire Did it involve swelling of the face/tongue/throat, SOB, or low BP? No Did it involve sudden or severe rash/hives, skin peeling, or any reaction on the inside of your mouth or nose? No Did you need to seek medical attention at a hospital or doctor's office? Yes When did it last happen?30 years ago If all above answers are "NO", may proceed with cephalosporin use.     DISCHARGE MEDICATIONS:   Allergies as of 08/09/2019      Reactions   Lisinopril Swelling   Reaction: angioedema   Penicillins Rash   Reaction: Feels like skin is on fire Did it involve swelling of the face/tongue/throat, SOB, or low BP? No Did it involve sudden or severe rash/hives, skin peeling, or any reaction on the inside of your mouth or nose?  No Did you need to seek medical attention at a hospital or doctor's office? Yes When did it last happen?30 years ago If all above answers are "NO", may proceed with cephalosporin use.      Medication List    TAKE these medications   acetaminophen 325 MG tablet Commonly known as: TYLENOL Take 2 tablets (650 mg total) by mouth every 6 (six) hours as needed for mild pain (or Fever >/= 101).   albuterol 108 (90 Base) MCG/ACT inhaler Commonly known  as: VENTOLIN HFA Inhale 2 puffs into the lungs every 6 (six) hours as needed for wheezing or shortness of breath.   buPROPion 150 MG 24 hr tablet Commonly known as: WELLBUTRIN XL Take 1 tablet (150 mg total) by mouth daily.   cloNIDine 0.1 MG tablet Commonly known as: CATAPRES Take 2 tablets (0.2 mg total) by mouth 2 (two) times daily. What changed: how much to take   diphenhydrAMINE 25 mg capsule Commonly known as: BENADRYL Take 1 capsule (25 mg total) by mouth every 6 (six) hours as needed for itching or allergies.   EPINEPHrine 0.3 mg/0.3 mL Soaj injection Commonly known as: EPI-PEN Inject 0.3 mLs (0.3 mg total) into the muscle as needed for anaphylaxis.   famotidine 20 MG tablet Commonly known as: PEPCID Take 1 tablet (20 mg total) by mouth 2 (two) times daily.   finasteride 5 MG tablet Commonly known as: PROSCAR Take 1 tablet (5 mg total) by mouth daily.   folic acid 1 MG tablet Commonly known as: FOLVITE Take 1 tablet (1 mg total) by mouth daily.   HYDROcodone-acetaminophen 5-325 MG tablet Commonly known as: NORCO/VICODIN Take 1 tablet by mouth every 6 (six) hours as needed for moderate pain.   hydrocortisone 2.5 % rectal cream Commonly known as: ANUSOL-HC Place 1 application rectally 2 (two) times daily.   Ipratropium-Albuterol 20-100 MCG/ACT Aers respimat Commonly known as: COMBIVENT Inhale 1 puff into the lungs every 6 (six) hours.   lidocaine 5 % Commonly known as: Lidoderm Place 1 patch onto the skin every 12 (twelve) hours. Remove & Discard patch within 12 hours or as directed by MD   montelukast 10 MG tablet Commonly known as: SINGULAIR Take 1 tablet (10 mg total) by mouth daily.   multivitamin with minerals Tabs tablet Take 1 tablet by mouth daily.   nicotine 21 mg/24hr patch Commonly known as: NICODERM CQ - dosed in mg/24 hours Place 1 patch (21 mg total) onto the skin daily.   omeprazole 40 MG capsule Commonly known as: PRILOSEC Take 1  capsule (40 mg total) by mouth daily.   predniSONE 10 MG (21) Tbpk tablet Commonly known as: STERAPRED UNI-PAK 21 TAB Take 1 tablet (10 mg total) by mouth daily. Take 6 tablets by mouth for 1 day followed by  5 tablets by mouth for 1 day followed by  4 tablets by mouth for 1 day followed by  3 tablets by mouth for 1 day followed by  2 tablets by mouth for 1 day followed by  1 tablet by mouth for a day and stop   tamsulosin 0.4 MG Caps capsule Commonly known as: FLOMAX Take 1 capsule (0.4 mg total) by mouth daily.   thiamine 100 MG tablet Take 1 tablet (100 mg total) by mouth daily.   tiotropium 18 MCG inhalation capsule Commonly known as: Spiriva HandiHaler Place 1 capsule (18 mcg total) into inhaler and inhale daily.   triamcinolone cream 0.1 % Commonly known as: KENALOG Apply 1  application topically 2 (two) times daily.        DISCHARGE INSTRUCTIONS:   Patient to follow-up with primary care physician 2 to 3 days or sooner as needed  DIET:  Low salt diet  DISCHARGE CONDITION:  Fair  ACTIVITY:  Activity as tolerated  OXYGEN:  Home Oxygen: No.   Oxygen Delivery: room air  DISCHARGE LOCATION:  home   If you experience worsening of your admission symptoms, develop shortness of breath, life threatening emergency, suicidal or homicidal thoughts you must seek medical attention immediately by calling 911 or calling your MD immediately  if symptoms less severe.  You Must read complete instructions/literature along with all the possible adverse reactions/side effects for all the Medicines you take and that have been prescribed to you. Take any new Medicines after you have completely understood and accpet all the possible adverse reactions/side effects.   Please note  You were cared for by a hospitalist during your hospital stay. If you have any questions about your discharge medications or the care you received while you were in the hospital after you are discharged,  you can call the unit and asked to speak with the hospitalist on call if the hospitalist that took care of you is not available. Once you are discharged, your primary care physician will handle any further medical issues. Please note that NO REFILLS for any discharge medications will be authorized once you are discharged, as it is imperative that you return to your primary care physician (or establish a relationship with a primary care physician if you do not have one) for your aftercare needs so that they can reassess your need for medications and monitor your lab values.     Today  Chief Complaint  Patient presents with  . Angioedema   Patient is feeling fine.  Swallowing without any difficulty denies any throat pain shortness of breath.  Lips and tongue are back to normal and he wants to go home  ROS:  CONSTITUTIONAL: Denies fevers, chills. Denies any fatigue, weakness.  EYES: Denies blurry vision, double vision, eye pain. EARS, NOSE, THROAT: Denies tinnitus, ear pain, hearing loss. RESPIRATORY: Denies cough, wheeze, shortness of breath.  CARDIOVASCULAR: Denies chest pain, palpitations, edema.  GASTROINTESTINAL: Denies nausea, vomiting, diarrhea, abdominal pain. Denies bright red blood per rectum. GENITOURINARY: Denies dysuria, hematuria. ENDOCRINE: Denies nocturia or thyroid problems. HEMATOLOGIC AND LYMPHATIC: Denies easy bruising or bleeding. SKIN: Denies rash or lesion. MUSCULOSKELETAL: Denies pain in neck, back, shoulder, knees, hips or arthritic symptoms.  NEUROLOGIC: Denies paralysis, paresthesias.  PSYCHIATRIC: Denies anxiety or depressive symptoms.   VITAL SIGNS:  Blood pressure 120/70, pulse 76, temperature 98.7 F (37.1 C), temperature source Oral, resp. rate 16, height 5\' 10"  (1.778 m), weight 79 kg, SpO2 99 %.  I/O:    Intake/Output Summary (Last 24 hours) at 08/09/2019 0933 Last data filed at 08/09/2019 0200 Gross per 24 hour  Intake 89.66 ml  Output 680 ml  Net  -590.34 ml    PHYSICAL EXAMINATION:  GENERAL:  58 y.o.-year-old patient lying in the bed with no acute distress.  EYES: Pupils equal, round, reactive to light and accommodation. No scleral icterus. Extraocular muscles intact.  HEENT: Head atraumatic, normocephalic. Oropharynx and nasopharynx clear.  NECK:  Supple, no jugular venous distention. No thyroid enlargement, no tenderness.  LUNGS: Normal breath sounds bilaterally, no wheezing, rales,rhonchi or crepitation. No use of accessory muscles of respiration.  CARDIOVASCULAR: S1, S2 normal. No murmurs, rubs, or gallops.  ABDOMEN: Soft, non-tender, non-distended.  Bowel sounds present.  EXTREMITIES: No pedal edema, cyanosis, or clubbing.  NEUROLOGIC: Cranial nerves II through XII are intact. Muscle strength 5/5 in all extremities. Sensation intact. Gait not checked.  PSYCHIATRIC: The patient is alert and oriented x 3.  SKIN: No obvious rash, lesion, or ulcer.   DATA REVIEW:   CBC Recent Labs  Lab 08/08/19 0436  WBC 16.7*  HGB 15.4  HCT 44.7  PLT 380    Chemistries  Recent Labs  Lab 08/09/19 0558  NA 136  K 4.0  CL 102  CO2 23  GLUCOSE 151*  BUN 16  CREATININE 0.96  CALCIUM 8.6*  MG 2.2    Cardiac Enzymes No results for input(s): TROPONINI in the last 168 hours.  Microbiology Results  Results for orders placed or performed during the hospital encounter of 08/07/19  SARS Coronavirus 2 [facility] order, Performed in [facility] hospital lab) Nasopharyngeal Nasopharyngeal Swab     Status: None   Collection Time: 08/07/19 11:23 PM   Specimen: Nasopharyngeal Swab  Result Value Ref Range Status   SARS Coronavirus 2 NEGATIVE NEGATIVE Final    Comment: (NOTE) If result is NEGATIVE SARS-CoV-2 target nucleic acids are NOT DETECTED. The SARS-CoV-2 RNA is generally detectable in upper and lower  respiratory specimens during the acute phase of infection. The lowest  concentration of SARS-CoV-2 viral copies this assay can  detect is 250  copies / mL. A negative result does not preclude SARS-CoV-2 infection  and should not be used as the sole basis for treatment or other  patient management decisions.  A negative result may occur with  improper specimen collection / handling, submission of specimen other  than nasopharyngeal swab, presence of viral mutation(s) within the  areas targeted by this assay, and inadequate number of viral copies  (<250 copies / mL). A negative result must be combined with clinical  observations, patient history, and epidemiological information. If result is POSITIVE SARS-CoV-2 target nucleic acids are DETECTED. The SARS-CoV-2 RNA is generally detectable in upper and lower  respiratory specimens dur ing the acute phase of infection.  Positive  results are indicative of active infection with SARS-CoV-2.  Clinical  correlation with patient history and other diagnostic information is  necessary to determine patient infection status.  Positive results do  not rule out bacterial infection or co-infection with other viruses. If result is PRESUMPTIVE POSTIVE SARS-CoV-2 nucleic acids MAY BE PRESENT.   A presumptive positive result was obtained on the submitted specimen  and confirmed on repeat testing.  While 2019 novel coronavirus  (SARS-CoV-2) nucleic acids may be present in the submitted sample  additional confirmatory testing may be necessary for epidemiological  and / or clinical management purposes  to differentiate between  SARS-CoV-2 and other Sarbecovirus currently known to infect humans.  If clinically indicated additional testing with an alternate test  methodology [phone]) is advised. The SARS-CoV-2 RNA is generally  detectable in upper and lower respiratory sp ecimens during the acute  phase of infection. The expected result is Negative. Fact Sheet for Patients:  StrictlyIdeas.no Fact Sheet for Healthcare  Providers: BankingDealers.co.za This test is not yet approved or cleared by the [place] FDA and has been authorized for detection and/or diagnosis of SARS-CoV-2 by FDA under an Emergency Use Authorization (EUA).  This EUA will remain in effect (meaning this test can be used) for the duration of the COVID-19 declaration under Section 564(b)(1) of the Act, 21 U.S.C. section 360bbb-3(b)(1), unless the authorization is terminated or  revoked sooner. Performed at [facility], [address] [place], [place] [address]   MRSA PCR Screening     Status: Abnormal   Collection Time: 08/08/19  2:23 AM   Specimen: Urine, Clean Catch; Nasopharyngeal  Result Value Ref Range Status   MRSA by PCR POSITIVE (A) NEGATIVE Final    Comment:        The GeneXpert MRSA Assay (FDA approved for NASAL specimens only), is one component of a comprehensive MRSA colonization surveillance program. It is not intended to diagnose MRSA infection nor to guide or monitor treatment for MRSA infections. RESULT CALLED TO, READ BACK BY AND VERIFIED WITH: CHRISTINA BOYNES @509  08/08/2019 TTG Performed at [facility], [address] [place], [place] [address]     RADIOLOGY:  Dg Chest Port 1 View  Result Date: 08/07/2019 CLINICAL DATA:  Angioedema. EXAM: PORTABLE CHEST 1 VIEW COMPARISON:  May 31, 2019 FINDINGS: The heart size and mediastinal contours are within normal limits. Both lungs are clear. The visualized skeletal structures are unremarkable. IMPRESSION: No active disease. Electronically Signed   By: Constance Holster M.D.   On: 08/07/2019 23:56    EKG:   Orders placed or performed during the hospital encounter of 08/07/19  . ED EKG  . ED EKG  . EKG 12-Lead  . EKG 12-Lead      Management plans discussed with the patient,he is  in agreement.  CODE STATUS:     Code Status Orders  (From admission, onward)         Start     Ordered   08/08/19 0226   Full code  Continuous     08/08/19 0225        Code Status History    This patient has a current code status but no historical code status.   Advance Care Planning Activity      TOTAL TIME TAKING CARE OF THIS PATIENT: 45  minutes.   Note: This dictation was prepared with Dragon dictation along with smaller phrase technology. Any transcriptional errors that result from this process are unintentional.   @MEC @  on 08/09/2019 at 9:33 AM  Between 7am to 6pm - Pager - [phone]  After 6pm go to [email or web address] - password [email or web address] [facility]  Office  [phone]  CC: Primary care physician; [name], NP

## 2019-08-09 NOTE — Progress Notes (Signed)
Discharged home with a friend.  Instructions given about medication changes.  He has a follow up appointment with his PCP on the 29th.

## 2019-08-09 NOTE — Discharge Instructions (Signed)
Patient to follow-up with primary care physician 2 to 3 days or sooner as needed

## 2019-08-09 NOTE — Progress Notes (Signed)
A few minutes ago patient says his rides works from 7 pm to 7 am and he would like to leave soon so as not to interrupt their sleep.  He now says "Either the doctor comes to see me now or I'm gong to walk out of here".  Messages sent to Dr. Margaretmary Eddy with both concerns.

## 2019-08-10 LAB — C4 COMPLEMENT: Complement C4, Body Fluid: 27 mg/dL (ref 14–44)

## 2019-08-11 ENCOUNTER — Telehealth: Payer: Self-pay

## 2019-08-11 ENCOUNTER — Ambulatory Visit: Payer: Self-pay | Admitting: *Deleted

## 2019-08-11 LAB — COMPLEMENT, TOTAL: Compl, Total (CH50): 42 U/mL (ref >41)

## 2019-08-11 NOTE — Telephone Encounter (Signed)
If he has access to video we can do virtual tomorrow, but if no access to video he will need urgent care for quicker assessment.

## 2019-08-11 NOTE — Telephone Encounter (Signed)
Summary: advice for jaw, tongue, face soreness and slight swelling   Patient called in stating he had an allergic reaction to lisinopril Saturday that caused him to go to the ED and admitted into hospital. Patient is asking for advice on what to do for his jaw, tongue, and face soreness and slight swelling. Stated he has tried rising with peroxide and it cause it to feel like he had "swallowed gasoline and lit a match." Patient has been unable to get comfortable swallowing since. Please advise.      Call to patient- he had allergic reaction- to Lisinopril on Saturday that sent him to hospital ICU. Patient states he is having pain in his mouth due to "rawness" on tongue and inside of mouth.  Call to office- they do not have opening today and protocol states patient needs to be seen in 4 hours. Advised patient UC- patient states he can not go because the charge is too expensive. He does have appointment with office 9/29- but that is too far out. Note sent to office for review.  Reason for Disposition . [1] SEVERE mouth pain (e.g., excruciating) AND [2] not improved after 2 hours of pain medicine  Answer Assessment - Initial Assessment Questions 1. ONSET: "When did the mouth start hurting?" (e.g., hours or days ago)      Mouth has been hurting since Saturday 2. SEVERITY: "How bad is the pain?" (Scale 1-10; mild, moderate or severe)   - MILD (1-3):  doesn't interfere with eating or normal activities   - MODERATE (4-7): interferes with eating some solids and normal activities   - SEVERE (8-10):  excruciating pain, interferes with most normal activities   - SEVERE DYSPHAGIA: can't swallow liquids, drooling     10- severe 3. SORES: "Are there any sores or ulcers in the mouth?" If so, ask: "What part of the mouth are the sores in?"     Tongue has sores 4. FEVER: "Do you have a fever?" If so, ask: "What is your temperature, how was it measured, and when did it start?"     No fever 5. CAUSE: "What do  you think is causing the mouth pain?"     Rawness in mouth 6. OTHER SYMPTOMS: "Do you have any other symptoms?" (e.g., difficulty breathing)     Swelling in tongue and jaw- still present- not returned to normal.  Protocols used: MOUTH PAIN-A-AH

## 2019-08-11 NOTE — Telephone Encounter (Signed)
I have made the 1st attempt to contact the patient or family member in charge, in order to follow up from recently being discharged from the hospital. I left a message on voicemail but I will make another attempt at a different time.  

## 2019-08-13 ENCOUNTER — Telehealth: Payer: Self-pay

## 2019-08-17 ENCOUNTER — Encounter: Payer: Self-pay | Admitting: Nurse Practitioner

## 2019-08-17 ENCOUNTER — Ambulatory Visit (INDEPENDENT_AMBULATORY_CARE_PROVIDER_SITE_OTHER): Payer: Medicaid Other | Admitting: Nurse Practitioner

## 2019-08-17 ENCOUNTER — Other Ambulatory Visit: Payer: Self-pay

## 2019-08-17 ENCOUNTER — Ambulatory Visit: Payer: Self-pay | Admitting: *Deleted

## 2019-08-17 ENCOUNTER — Encounter: Payer: Self-pay | Admitting: *Deleted

## 2019-08-17 ENCOUNTER — Ambulatory Visit: Payer: Self-pay | Admitting: Pharmacist

## 2019-08-17 VITALS — BP 152/88 | HR 81 | Temp 98.1°F

## 2019-08-17 DIAGNOSIS — T783XXS Angioneurotic edema, sequela: Secondary | ICD-10-CM

## 2019-08-17 DIAGNOSIS — F17219 Nicotine dependence, cigarettes, with unspecified nicotine-induced disorders: Secondary | ICD-10-CM

## 2019-08-17 DIAGNOSIS — J449 Chronic obstructive pulmonary disease, unspecified: Secondary | ICD-10-CM

## 2019-08-17 DIAGNOSIS — F4312 Post-traumatic stress disorder, chronic: Secondary | ICD-10-CM

## 2019-08-17 DIAGNOSIS — F209 Schizophrenia, unspecified: Secondary | ICD-10-CM | POA: Diagnosis not present

## 2019-08-17 DIAGNOSIS — I10 Essential (primary) hypertension: Secondary | ICD-10-CM | POA: Diagnosis not present

## 2019-08-17 DIAGNOSIS — Z23 Encounter for immunization: Secondary | ICD-10-CM

## 2019-08-17 DIAGNOSIS — G894 Chronic pain syndrome: Secondary | ICD-10-CM

## 2019-08-17 MED ORDER — HYDROCODONE-ACETAMINOPHEN 5-325 MG PO TABS: 1.0000 | ORAL_TABLET | Freq: Four times a day (QID) | ORAL | 0 refills | Status: DC | PRN

## 2019-08-17 MED ORDER — TIOTROPIUM BROMIDE-OLODATEROL 2.5-2.5 MCG/ACT IN AERS: 2.0000 | INHALATION_SPRAY | Freq: Every day | RESPIRATORY_TRACT | 3 refills | Status: DC

## 2019-08-17 MED ORDER — AMLODIPINE BESYLATE 5 MG PO TABS: 5.0000 mg | ORAL_TABLET | Freq: Every day | ORAL | 3 refills | Status: DC

## 2019-08-17 MED ORDER — HYDROCHLOROTHIAZIDE 25 MG PO TABS: 25.0000 mg | ORAL_TABLET | Freq: Every day | ORAL | 3 refills | Status: DC

## 2019-08-17 NOTE — Patient Instructions (Addendum)
STOP taking spiriva and start taking stiolto.    Stop taking clonidine and start taking Amlodipine-HCTZ.  Influenza (Flu) Vaccine (Inactivated or Recombinant): What You Need to Know 1. Why get vaccinated? Influenza vaccine can prevent influenza (flu). Flu is a contagious disease that spreads around the [place] every year, usually between October and May. Anyone can get the flu, but it is more dangerous for some people. Infants and young children, people 56 years of age and older, pregnant women, and people with certain health conditions or a weakened immune system are at greatest risk of flu complications. Pneumonia, bronchitis, sinus infections and ear infections are examples of flu-related complications. If you have a medical condition, such as heart disease, cancer or diabetes, flu can make it worse. Flu can cause fever and chills, sore throat, muscle aches, fatigue, cough, headache, and runny or stuffy nose. Some people may have vomiting and diarrhea, though this is more common in children than adults. Each year thousands of people in the [place] die from flu, and many more are hospitalized. Flu vaccine prevents millions of illnesses and flu-related visits to the doctor each year. 2. Influenza vaccine CDC recommends everyone 3 months of age and older get vaccinated every flu season. Children 6 months through 49 years of age may need 2 doses during a single flu season. Everyone else needs only 1 dose each flu season. It takes about 2 weeks for protection to develop after vaccination. There are many flu viruses, and they are always changing. Each year a new flu vaccine is made to protect against three or four viruses that are likely to cause disease in the upcoming flu season. Even when the vaccine doesn't exactly match these viruses, it may still provide some protection. Influenza vaccine does not cause flu. Influenza vaccine may be given at the same time as other vaccines. 3. Talk  with your health care provider Tell your vaccine provider if the person getting the vaccine:  Has had an allergic reaction after a previous dose of influenza vaccine, or has any severe, life-threatening allergies.  Has ever had Guillain-Barr Syndrome (also called GBS). In some cases, your health care provider may decide to postpone influenza vaccination to a future visit. People with minor illnesses, such as a cold, may be vaccinated. People who are moderately or severely ill should usually wait until they recover before getting influenza vaccine. Your health care provider can give you more information. 4. Risks of a vaccine reaction  Soreness, redness, and swelling where shot is given, fever, muscle aches, and headache can happen after influenza vaccine.  There may be a very small increased risk of Guillain-Barr Syndrome (GBS) after inactivated influenza vaccine (the flu shot). Young children who get the flu shot along with pneumococcal vaccine (PCV13), and/or DTaP vaccine at the same time might be slightly more likely to have a seizure caused by fever. Tell your health care provider if a child who is getting flu vaccine has ever had a seizure. People sometimes faint after medical procedures, including vaccination. Tell your provider if you feel dizzy or have vision changes or ringing in the ears. As with any medicine, there is a very remote chance of a vaccine causing a severe allergic reaction, other serious injury, or death. 5. What if there is a serious problem? An allergic reaction could occur after the vaccinated person leaves the clinic. If you see signs of a severe allergic reaction (hives, swelling of the face and throat, difficulty breathing, a fast  heartbeat, dizziness, or weakness), call 9-1-1 and get the person to the nearest hospital. For other signs that concern you, call your health care provider. Adverse reactions should be reported to the Vaccine Adverse Event Reporting  System (VAERS). Your health care provider will usually file this report, or you can do it yourself. Visit the VAERS website at www.vaers.SamedayNews.es or call 959-348-1889.VAERS is only for reporting reactions, and VAERS staff do not give medical advice. 6. The National Vaccine Injury Compensation Program The [employer] Vaccine Injury Compensation Program (VICP) is a federal program that was created to compensate people who may have been injured by certain vaccines. Visit the VICP website at GoldCloset.com.ee or call (281) 134-5224 to learn about the program and about filing a claim. There is a time limit to file a claim for compensation. 7. How can I learn more?  Ask your healthcare provider.  Call your local or state health department.  Contact the Centers for Disease Control and Prevention (CDC): ? Call (947)480-8615 (1-800-CDC-INFO) or ? Visit CDC's https://gibson.com/ Vaccine Information Statement (Interim) Inactivated Influenza Vaccine (07/02/2018) This information is not intended to replace advice given to you by your health care provider. Make sure you discuss any questions you have with your health care provider. Document Released: 08/29/2006 Document Revised: 02/23/2019 Document Reviewed: 07/06/2018 Elsevier Patient Education  2020 [employer].

## 2019-08-17 NOTE — Patient Instructions (Signed)
Visit Information  Goals Addressed            This Visit's Progress     Patient Stated   . PharmD "I have a hard time with my breathing" (pt-stated)       Current Barriers:  . Polypharmacy; complex patient with multiple comorbidities including COPD, tobacco abuse . Patient reports that if he stops smoking "I get mean as a snake". Is not interested in discussing cessation at this time. Is not taking bupropion or nicotine patches . Notes that he is taking Spiriva QAM, with the occasional 1-2 doses of Combivent in the afternoons.   Pharmacist Clinical Goal(s):  [name] Over the next 90 days, patient will work with PharmD and provider towards optimized medication management  Interventions: . Comprehensive medication review performed; medication list updated in electronic medical record . Discussed benefit of combo LABA/LAMA therapy. Discussed w/ [name] [name], recommend d/c Spiriva and start Stiolo Respimat (patient understands administration technique d/t taking Combivent Respimat). Patient verbalizes understanding . Will continue to evaluate desire/ability to consider tobacco cessation as patient's psychiatric needs and chronic pain needs are better addressed  Patient Self Care Activities:  . Patient will take medications as prescribed  Initial goal documentation     . PharmD "My blood pressure is uncontrolled" (pt-stated)       Current Barriers:  . Uncontrolled hypertension, complicated by BPH, PTSD, recent admission for angioedema related to lisinopril therapy . Current antihypertensive regimen: clonidine 0.1 mg QAM (though prescribed 0.2 mg BID) o Patient reports feeling "dried out" and "sleepy" with this medication; does report that he will occasionally take a second tablet in the evening to help him sleep . Previous antihypertensives tried: lisinopril (angioedema); HCTZ, remembers diuresis . Current home BP readings: not checking at home since admission  Pharmacist Clinical  Goal(s):  [name] Over the next 90 days, patient will work with PharmD and providers to optimize antihypertensive regimen  Interventions: . Comprehensive medication review performed; medication list updated in the electronic medical record.  . Discussed anticholinergic side effects with clonidine and that it would not be preferred therapy. Discussed w/ [name] [name]; recommend d/c clonidine and start amlodipine + HCTZ. She has sent these in today . Recommended patient check BP at home periodically. Provided with BP log to document readings for future telephonic review with providers.   Patient Self Care Activities:  . Patient will continue to check BP daily , document, and provide at future appointments . Patient will focus on medication adherence  Initial goal documentation        The patient verbalized understanding of instructions provided today and declined a print copy of patient instruction materials.   Plan: - Will outreach patient in 4-6 weeks for continued medication management support.  Catie Darnelle Maffucci, PharmD Clinical Pharmacist [place] [phone]

## 2019-08-17 NOTE — Assessment & Plan Note (Signed)
Chronic, ongoing in setting of long term smoking.  Will discontinue Spiriva and start Stiolto for benefit of LABA/LAMA per GOLD standards, script sent.  Obtain spirometry and discuss low dose CT scan for CA screening next visit.  Recommend complete cessation of smoking.  Return in 4 weeks.

## 2019-08-17 NOTE — Chronic Care Management (AMB) (Signed)
Chronic Care Management   Note  08/17/2019 Name: Luke Briggs. MRN: [id number] DOB: 1960-12-10   Subjective:  Luke Vignola. is a 58 y.o. year old male who is a primary care patient of [name], Luke Faster, NP. The CCM team was consulted for assistance with chronic disease management and care coordination needs.    Met with patient face to face with Luke Minor, RN CM, during primary care provider appointment today.   Review of patient status, including review of consultants reports, laboratory and other test data, was performed as part of comprehensive evaluation and provision of chronic care management services.   Objective:  Lab Results  Component Value Date   CREATININE 0.96 08/09/2019   CREATININE 0.78 08/08/2019   CREATININE 0.92 08/07/2019    No results found for: HGBA1C     Component Value Date/Time   CHOL 166 05/05/2019 1424   TRIG 121 05/05/2019 1424   HDL 56 05/05/2019 1424   Gillespie 86 05/05/2019 1424    Clinical ASCVD: No  The 10-year ASCVD risk score Mikey Bussing DC Jr., et al., 2013) is: 12.8%   Values used to calculate the score:     Age: 49 years     Sex: Male     Is Non-Hispanic African American: No     Diabetic: No     Tobacco smoker: Yes     Systolic Blood Pressure: 417 mmHg     Is BP treated: Yes     HDL Cholesterol: 56 mg/dL     Total Cholesterol: 166 mg/dL    BP Readings from Last 3 Encounters:  08/17/19 (!) 152/88  08/09/19 120/70  07/20/19 127/86    Allergies  Allergen Reactions  . Lisinopril Swelling    Reaction: angioedema  . Penicillins Rash    Reaction: Feels like skin is on fire Did it involve swelling of the face/tongue/throat, SOB, or low BP? No Did it involve sudden or severe rash/hives, skin peeling, or any reaction on the inside of your mouth or nose? No Did you need to seek medical attention at a hospital or doctor's office? Yes When did it last happen?30 years ago If all above answers are "NO", may proceed with  cephalosporin use.     Medications Reviewed Today    Reviewed by Luke Lick, NP (Nurse Practitioner) on 08/17/19 at 1147  Med List Status: <None>  Medication Order Taking? Sig Documenting Provider Last Dose Status Informant  albuterol (VENTOLIN HFA) 108 (90 Base) MCG/ACT inhaler [id number] Yes Inhale 2 puffs into the lungs every 6 (six) hours as needed for wheezing or shortness of breath. Duffy Bruce, MD Taking Active Self  buPROPion (WELLBUTRIN XL) 150 MG 24 hr tablet [id number] Yes Take 1 tablet (150 mg total) by mouth daily.  Patient not taking: Reported on 08/17/2019   Marnee Guarneri T, NP Taking Active Self  cloNIDine (CATAPRES) 0.1 MG tablet [id number] Yes Take 2 tablets (0.2 mg total) by mouth 2 (two) times daily. Nicholes Mango, MD Taking Active            Med Note Darnelle Maffucci,  E   Tue Aug 17, 2019 11:13 AM) Taking 1 tab QAM   diphenhydrAMINE (BENADRYL) 25 mg capsule [id number] Yes Take 1 capsule (25 mg total) by mouth every 6 (six) hours as needed for itching or allergies.  Patient not taking: Reported on 08/17/2019   Nicholes Mango, MD Taking Active   EPINEPHrine 0.3 mg/0.3 mL IJ SOAJ injection [id number] Yes Inject 0.3 mLs (  0.3 mg total) into the muscle as needed for anaphylaxis.  Patient not taking: Reported on 08/17/2019   Nicholes Mango, MD Taking Active   finasteride (PROSCAR) 5 MG tablet [id number] Yes Take 1 tablet (5 mg total) by mouth daily. Marnee Guarneri T, NP Taking Active Self  folic acid (FOLVITE) 1 MG tablet [id number] Yes Take 1 tablet (1 mg total) by mouth daily.  Patient not taking: Reported on 08/17/2019   Nicholes Mango, MD Taking Active   HYDROcodone-acetaminophen (NORCO/VICODIN) 5-325 MG tablet [id number] Yes Take 1 tablet by mouth every 6 (six) hours as needed for moderate pain. Luke Lick, NP Taking Active Self           Med Note ([place] Aug 17, 2019 11:16 AM) Taking 3-4 times daily   hydrocortisone (ANUSOL-HC) 2.5 % rectal cream  [id number] Yes Place 1 application rectally 2 (two) times daily. Luke Lick, NP Taking Active Self  Ipratropium-Albuterol (COMBIVENT) 20-100 MCG/ACT AERS respimat [id number] Yes Inhale 1 puff into the lungs every 6 (six) hours. Johnn Hai, PA-C Taking Active Self           Med Note ([place] Aug 17, 2019 11:17 AM) Using 1-2 times daily   montelukast (SINGULAIR) 10 MG tablet [id number] Yes Take 1 tablet (10 mg total) by mouth daily.  Patient not taking: Reported on 08/17/2019   Johnn Hai, PA-C Taking Active Self  Multiple Vitamin (MULTIVITAMIN WITH MINERALS) TABS tablet [id number] Yes Take 1 tablet by mouth daily. Nicholes Mango, MD Taking Active   omeprazole (PRILOSEC) 40 MG capsule [id number] Yes Take 1 capsule (40 mg total) by mouth daily. Marnee Guarneri T, NP Taking Active Self  tamsulosin (FLOMAX) 0.4 MG CAPS capsule [id number] Yes Take 1 capsule (0.4 mg total) by mouth daily. Marnee Guarneri T, NP Taking Active Self  thiamine 100 MG tablet [id number] No Take 1 tablet (100 mg total) by mouth daily.  Patient not taking: Reported on 08/17/2019   Nicholes Mango, MD Not Taking Active   tiotropium (SPIRIVA HANDIHALER) 18 MCG inhalation capsule [id number] Yes Place 1 capsule (18 mcg total) into inhaler and inhale daily. Marnee Guarneri T, NP Taking Active Self           Med Note Darnelle Maffucci,  E   Tue Aug 17, 2019 11:17 AM) QAM  triamcinolone cream (KENALOG) 0.1 % [id number] Yes Apply 1 application topically 2 (two) times daily. Luke Lick, NP Taking Active Self           Assessment:   Goals Addressed            This Visit's Progress     Patient Stated   . PharmD "I have a hard time with my breathing" (pt-stated)       Current Barriers:  . Polypharmacy; complex patient with multiple comorbidities including COPD, tobacco abuse . Patient reports that if he stops smoking "I get mean as a snake". Is not interested in discussing cessation at this time.  Is not taking bupropion or nicotine patches . Notes that he is taking Spiriva QAM, with the occasional 1-2 doses of Combivent in the afternoons.   Pharmacist Clinical Goal(s):  [name] Over the next 90 days, patient will work with PharmD and provider towards optimized medication management  Interventions: . Comprehensive medication review performed; medication list updated in electronic medical record . Discussed benefit of combo LABA/LAMA therapy. Discussed w/ [name] [name], recommend d/c Spiriva and start Stiolo  Respimat (patient understands administration technique d/t taking Combivent Respimat). Patient verbalizes understanding . Will continue to evaluate desire/ability to consider tobacco cessation as patient's psychiatric needs and chronic pain needs are better addressed  Patient Self Care Activities:  . Patient will take medications as prescribed  Initial goal documentation     . PharmD "My blood pressure is uncontrolled" (pt-stated)       Current Barriers:  . Uncontrolled hypertension, complicated by BPH, PTSD, recent admission for angioedema related to lisinopril therapy . Current antihypertensive regimen: clonidine 0.1 mg QAM (though prescribed 0.2 mg BID) o Patient reports feeling "dried out" and "sleepy" with this medication; does report that he will occasionally take a second tablet in the evening to help him sleep . Previous antihypertensives tried: lisinopril (angioedema); HCTZ, remembers diuresis . Current home BP readings: not checking at home since admission  Pharmacist Clinical Goal(s):  [name] Over the next 90 days, patient will work with PharmD and providers to optimize antihypertensive regimen  Interventions: . Comprehensive medication review performed; medication list updated in the electronic medical record.  . Discussed anticholinergic side effects with clonidine and that it would not be preferred therapy. Discussed w/ [name] [name]; recommend d/c clonidine and start  amlodipine + HCTZ. She has sent these in today . Recommended patient check BP at home periodically. Provided with BP log to document readings for future telephonic review with providers.   Patient Self Care Activities:  . Patient will continue to check BP daily , document, and provide at future appointments . Patient will focus on medication adherence  Initial goal documentation        Plan: - Will outreach patient in 4-6 weeks for continued medication management support.  Catie Darnelle Maffucci, PharmD Clinical Pharmacist [place] [phone]

## 2019-08-17 NOTE — Assessment & Plan Note (Signed)
Ongoing, referral to psychiatry placed as patient would benefit from more comprehensive psychiatric care due to long term history and opioid use.  May consider Depakote, which would benefit mood stabilization if increase in behaviors.  Avoid benzos.
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
## 2019-08-17 NOTE — Assessment & Plan Note (Signed)
Chronic, improved.  No further use of ACE due to angioedema.  Continue to carry epi pen as safety precaution.

## 2019-08-17 NOTE — Patient Instructions (Addendum)
Thank you allowing the Chronic Care Management Team to be a part of your care! It was a pleasure speaking with you today!   CCM (Chronic Care Management) Team   Renea Schoonmaker RN, BSN Nurse Care Coordinator  [phone]  Cat[facility] PharmD  Clinical Pharmacist  [phone]  Eula Fried LCSW Clinical Social Worker [phone]  Goals Addressed            This Visit's Progress   . 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 Chronic Disease Management support and education needs related to HTN, COPD, Chronic pain, Nicotine dependance and PTSD  Nurse Case Manager Clinical Goal(s):  [name] Over the next 120 days, patient will work with [facility] to address needs related to Multiple Chronic disease processes  Interventions:  . Evaluation of current treatment plan related to HTN and COPD and patient's adherence to plan as established by provider. . Advised patient to go by DSS office to make sure his insurance was correct in their system . Provided education to patient re: Tobacco cessation and how to obtain Lung Cancer Screening.  [name] with PCP and [facility] regarding pain management referral. At this time [place] is not accepting [facility] patients. Noted there was a discrepancy with patient's insurance when initial referral was made. Called a second time to clarify with [facility] patient's correct insurance and request they run the referral again, had to leave this on confidential vm. Awaiting a call back.   . Discussed plans with patient for ongoing care management follow up and provided patient with direct contact information for care management team . Advised patient, providing education and rationale, to monitor blood pressure daily and record, calling PCP for findings outside established parameters.  . Provided patient and/or caregiver with mailed  information about [place] TRANSIT community transportation Advice worker). . Provided patient with mailed written  educational materials related to HTN . Care Guide referral for assistance with Medicaid Transportation  Patient Self Care Activities:  . Currently UNABLE TO independently manage multiple chronic conditions.  [name] Performs ADL's independently  Initial goal documentation         Print copy of patient instructions provided.   The patient has been provided with contact information for the care management team and has been advised to call with any health related questions or concerns.     Hypertension, Adult Hypertension is another name for high blood pressure. High blood pressure forces your heart to work harder to pump blood. This can cause problems over time. There are two numbers in a blood pressure reading. There is a top number (systolic) over a bottom number (diastolic). It is best to have a blood pressure that is below 120/80. Healthy choices can help lower your blood pressure, or you may need medicine to help lower it. What are the causes? The cause of this condition is not known. Some conditions may be related to high blood pressure. What increases the risk?  Smoking.  Having type 2 diabetes mellitus, high cholesterol, or both.  Not getting enough exercise or physical activity.  Being overweight.  Having too much fat, sugar, calories, or salt (sodium) in your diet.  Drinking too much alcohol.  Having long-term (chronic) kidney disease.  Having a family history of high blood pressure.  Age. Risk increases with age.  Race. You may be at higher risk if you are  African American.  Gender. Men are at higher risk than women before age 30. After age 26, women are at higher risk than men.  Having obstructive sleep apnea.  Stress. What are the signs or symptoms?  High blood pressure may not cause symptoms. Very high blood pressure (hypertensive crisis) may  cause: ? Headache. ? Feelings of worry or nervousness (anxiety). ? Shortness of breath. ? Nosebleed. ? A feeling of being sick to your stomach (nausea). ? Throwing up (vomiting). ? Changes in how you see. ? Very bad chest pain. ? Seizures. How is this treated?  This condition is treated by making healthy lifestyle changes, such as: ? Eating healthy foods. ? Exercising more. ? Drinking less alcohol.  Your health care provider may prescribe medicine if lifestyle changes are not enough to get your blood pressure under control, and if: ? Your top number is above 130. ? Your bottom number is above 80.  Your personal target blood pressure may vary. Follow these instructions at home: Eating and drinking   If told, follow the DASH eating plan. To follow this plan: ? Fill one half of your plate at each meal with fruits and vegetables. ? Fill one fourth of your plate at each meal with whole grains. Whole grains include whole-wheat pasta, brown rice, and whole-grain bread. ? Eat or drink low-fat dairy products, such as skim milk or low-fat yogurt. ? Fill one fourth of your plate at each meal with low-fat (lean) proteins. Low-fat proteins include fish, chicken without skin, eggs, beans, and tofu. ? Avoid fatty meat, cured and processed meat, or chicken with skin. ? Avoid pre-made or processed food.  Eat less than 1,500 mg of salt each day.  Do not drink alcohol if: ? Your doctor tells you not to drink. ? You are pregnant, may be pregnant, or are planning to become pregnant.  If you drink alcohol: ? Limit how much you use to:  0-1 drink a day for women.  0-2 drinks a day for men. ? Be aware of how much alcohol is in your drink. In the [place] one drink equals one 12 oz bottle of beer (355 mL), one 5 oz glass of wine (148 mL), or one 1 oz glass of hard liquor (44 mL). Lifestyle   Work with your doctor to stay at a healthy weight or to lose weight. Ask your doctor what the best  weight is for you.  Get at least 30 minutes of exercise most days of the week. This may include walking, swimming, or biking.  Get at least 30 minutes of exercise that strengthens your muscles (resistance exercise) at least 3 days a week. This may include lifting weights or doing Pilates.  Do not use any products that contain nicotine or tobacco, such as cigarettes, e-cigarettes, and chewing tobacco. If you need help quitting, ask your doctor.  Check your blood pressure at home as told by your doctor.  Keep all follow-up visits as told by your doctor. This is important. Medicines  Take over-the-counter and prescription medicines only as told by your doctor. Follow directions carefully.  Do not skip doses of blood pressure medicine. The medicine does not work as well if you skip doses. Skipping doses also puts you at risk for problems.  Ask your doctor about side effects or reactions to medicines that you should watch for. Contact a doctor if you:  Think you are having a reaction to the medicine you are taking.  Have headaches that keep  coming back (recurring).  Feel dizzy.  Have swelling in your ankles.  Have trouble with your vision. Get help right away if you:  Get a very bad headache.  Start to feel mixed up (confused).  Feel weak or numb.  Feel faint.  Have very bad pain in your: ? Chest. ? Belly (abdomen).  Throw up more than once.  Have trouble breathing. Summary  Hypertension is another name for high blood pressure.  High blood pressure forces your heart to work harder to pump blood.  For most people, a normal blood pressure is less than 120/80.  Making healthy choices can help lower blood pressure. If your blood pressure does not get lower with healthy choices, you may need to take medicine. This information is not intended to replace advice given to you by your health care provider. Make sure you discuss any questions you have with your health care  provider. Document Released: 04/22/2008 Document Revised: 07/15/2018 Document Reviewed: 07/15/2018 Elsevier Patient Education  2020 [employer].
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
## 2019-08-17 NOTE — Assessment & Plan Note (Signed)
Chronic, ongoing with opioid use since September 2018, well documented on previous provider notes and in PMP.  Continue to work on pain clinic location for patient as he would benefit from more comprehensive pain management and specialist.  He has tolerated reduction from 10/325 to [place], is agreeable to discussing alternate pain medication options and further reductions, just does not want to be taken off pain medication "cold [place]".  CCM team assisting with pain management referral.  He agrees to psychiatry referral today.  Sent Norco script refill today for 30 days supply only with 0 refills to be filled 08/19/19.

## 2019-08-17 NOTE — Chronic Care Management (AMB) (Signed)
Care Management   Initial Visit Note  08/17/2019 Name: Luke Briggs. MRN: [id number] DOB: Mar 05, 1961  Subjective:   Objective:  Assessment: Luke Briggs. is a 58 y.o. year old male who sees [name], [name], NP for primary care. The care management team was consulted for assistance with care management and care coordination needs related to Disease Management Educational Needs Care Coordination Other transportation needs..   Review of patient status, including review of consultants reports, relevant laboratory and other test results, and collaboration with appropriate care team members and the patient's provider was performed as part of comprehensive patient evaluation and provision of care management services.    SDOH (Social Determinants of Health) screening performed today: Biomedical engineer  Food Insecurity  Social Connections Tobacco Use Physical Activity. See Care Plan for related entries.   Advanced Directives: N See Care Plan and Vynca application for related entries.   Outpatient Encounter Medications as of 08/17/2019  Medication Sig Note  . albuterol (VENTOLIN HFA) 108 (90 Base) MCG/ACT inhaler Inhale 2 puffs into the lungs every 6 (six) hours as needed for wheezing or shortness of breath.   [name] amLODipine (NORVASC) 5 MG tablet Take 1 tablet (5 mg total) by mouth daily.   [name] buPROPion (WELLBUTRIN XL) 150 MG 24 hr tablet Take 1 tablet (150 mg total) by mouth daily. (Patient not taking: Reported on 08/17/2019)   . diphenhydrAMINE (BENADRYL) 25 mg capsule Take 1 capsule (25 mg total) by mouth every 6 (six) hours as needed for itching or allergies. (Patient not taking: Reported on 08/17/2019)   . EPINEPHrine 0.3 mg/0.3 mL IJ SOAJ injection Inject 0.3 mLs (0.3 mg total) into the muscle as needed for anaphylaxis. (Patient not taking: Reported on 08/17/2019)   . finasteride (PROSCAR) 5 MG tablet Take 1 tablet (5 mg total) by mouth daily.   . folic acid  (FOLVITE) 1 MG tablet Take 1 tablet (1 mg total) by mouth daily. (Patient not taking: Reported on 08/17/2019)   . hydrochlorothiazide (HYDRODIURIL) 25 MG tablet Take 1 tablet (25 mg total) by mouth daily.   [name] HYDROcodone-acetaminophen (NORCO/VICODIN) 5-325 MG tablet Take 1 tablet by mouth every 6 (six) hours as needed for moderate pain. 08/17/2019: Taking 3-4 times daily   . hydrocortisone (ANUSOL-HC) 2.5 % rectal cream Place 1 application rectally 2 (two) times daily.   . Ipratropium-Albuterol (COMBIVENT) 20-100 MCG/ACT AERS respimat Inhale 1 puff into the lungs every 6 (six) hours. 08/17/2019: Using 1-2 times daily   . montelukast (SINGULAIR) 10 MG tablet Take 1 tablet (10 mg total) by mouth daily. (Patient not taking: Reported on 08/17/2019)   . Multiple Vitamin (MULTIVITAMIN WITH MINERALS) TABS tablet Take 1 tablet by mouth daily.   [name] omeprazole (PRILOSEC) 40 MG capsule Take 1 capsule (40 mg total) by mouth daily.   . tamsulosin (FLOMAX) 0.4 MG CAPS capsule Take 1 capsule (0.4 mg total) by mouth daily.   [name] thiamine 100 MG tablet Take 1 tablet (100 mg total) by mouth daily. (Patient not taking: Reported on 08/17/2019)   . Tiotropium Bromide-Olodaterol 2.5-2.5 MCG/ACT AERS Inhale 2 puffs into the lungs daily.   [name] triamcinolone cream (KENALOG) 0.1 % Apply 1 application topically 2 (two) times daily.   . [DISCONTINUED] cloNIDine (CATAPRES) 0.1 MG tablet Take 2 tablets (0.2 mg total) by mouth 2 (two) times daily. 08/17/2019: Taking 1 tab QAM    No facility-administered encounter medications on file as of 08/17/2019.     Goals  Addressed            This Visit's Progress   . 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 Chronic Disease Management support and education needs related to HTN, COPD, Chronic pain, Nicotine dependance and PTSD  Nurse Case Manager Clinical Goal(s):  [name] Over the next 120 days, patient  will work with [facility] to address needs related to Multiple Chronic disease processes  Interventions:  . Evaluation of current treatment plan related to HTN and COPD and patient's adherence to plan as established by provider. . Advised patient to go by DSS office to make sure his insurance was correct in their system . Provided education to patient re: Tobacco cessation and how to obtain Lung Cancer Screening.  [name] with PCP and [facility] regarding pain management referral. At this time [name] is not accepting Luke Briggs patients. Noted there was a discrepancy with patient's insurance when initial referral was made. Called a second time to clarify with [facility] patient's correct insurance and request they run the referral again, had to leave this on confidential vm. Awaiting a call back.   . Discussed plans with patient for ongoing care management follow up and provided patient with direct contact information for care management team . Advised patient, providing education and rationale, to monitor blood pressure daily and record, calling PCP for findings outside established parameters.  . Provided patient and/or caregiver with mailed information about [place] TRANS[place] community transportation Advice worker). . Provided patient with mailed written  educational materials related to HTN . Care Guide referral for assistance with Medicaid Transportation  Patient Self Care Activities:  . Currently UNABLE TO independently manage multiple chronic conditions.  [name] Performs ADL's independently  Initial goal documentation          Follow up plan:  The care management team will reach out to the patient again over the next 30  days.  The patient has been provided with contact information for the care management team and has been advised to call with any health related questions or concerns.   Luke Briggs was given information about Care Management services today  including:  1. Care Management services include personalized support from designated clinical staff supervised by a physician, including individualized plan of care and coordination with other care providers 2. 24/7 contact phone numbers for assistance for urgent and routine care needs. 3. The patient may stop Care Management services at any time (effective at the end of the month) by phone call to the office staff.  Patient agreed to services and verbal consent obtained.  Merlene Morse Jozlin Bently RN, BSN Nurse Case Editor, [facility] Care Management  [phone]) Business Mobile;

## 2019-08-17 NOTE — Assessment & Plan Note (Signed)
I have recommended complete cessation of tobacco use. I have discussed various options available for assistance with tobacco cessation including over the counter methods (Nicotine gum, patch and lozenges). We also discussed prescription options (Chantix, Nicotine Inhaler / Nasal Spray). The patient is not interested in pursuing any prescription tobacco cessation options at this time.  

## 2019-08-17 NOTE — Assessment & Plan Note (Addendum)
Chronic, ongoing with recent angioedema with Lisinopril.  Discussed with CCM PharmD will discontinue Clonidine and start HCTZ 25 MG and Amlodipine 5 MG, scripts sent.  Recommend patient check BP daily at home and provided log to him for documentation.  CBC and CMP today.  Plan to have him return in 4 weeks for in office visit with labs.

## 2019-08-18 LAB — COMPREHENSIVE METABOLIC PANEL
ALT: 29 IU/L (ref 0–44)
AST: 25 IU/L (ref 0–40)
Albumin/Globulin Ratio: 1.5 (ref 1.2–2.2)
Albumin: 4.1 g/dL (ref 3.8–4.9)
Alkaline Phosphatase: 77 IU/L (ref 39–117)
BUN/Creatinine Ratio: 13 (ref 9–20)
BUN: 13 mg/dL (ref 6–24)
Bilirubin Total: 0.5 mg/dL (ref 0.0–1.2)
CO2: 24 mmol/L (ref 20–29)
Calcium: 9.2 mg/dL (ref 8.7–10.2)
Chloride: 100 mmol/L (ref 96–106)
Creatinine, Ser: 0.98 mg/dL (ref 0.76–1.27)
GFR calc Af Amer: 99 mL/min/{1.73_m2} (ref >59)
GFR calc non Af Amer: 86 mL/min/{1.73_m2} (ref >59)
Globulin, Total: 2.8 g/dL (ref 1.5–4.5)
Glucose: 68 mg/dL (ref 65–99)
Potassium: 4.6 mmol/L (ref 3.5–5.2)
Sodium: 139 mmol/L (ref 134–144)
Total Protein: 6.9 g/dL (ref 6.0–8.5)

## 2019-08-18 LAB — CBC WITH DIFFERENTIAL/PLATELET
Basophils Absolute: 0.1 10*3/uL (ref 0.0–0.2)
Basos: 1 %
EOS (ABSOLUTE): 0.2 10*3/uL (ref 0.0–0.4)
Eos: 1 %
Hematocrit: 43.1 % (ref 37.5–51.0)
Hemoglobin: 14.9 g/dL (ref 13.0–17.7)
Immature Grans (Abs): 0.1 10*3/uL (ref 0.0–0.1)
Immature Granulocytes: 1 %
Lymphocytes Absolute: 3.1 10*3/uL (ref 0.7–3.1)
Lymphs: 21 %
MCH: 31.5 pg (ref 26.6–33.0)
MCHC: 34.6 g/dL (ref 31.5–35.7)
MCV: 91 fL (ref 79–97)
Monocytes Absolute: 1.2 10*3/uL — ABNORMAL HIGH (ref 0.1–0.9)
Monocytes: 8 %
Neutrophils Absolute: 9.9 10*3/uL — ABNORMAL HIGH (ref 1.4–7.0)
Neutrophils: 68 %
Platelets: 405 10*3/uL (ref 150–450)
RBC: 4.73 x10E6/uL (ref 4.14–5.80)
RDW: 12.8 % (ref 11.6–15.4)
WBC: 14.6 10*3/uL — ABNORMAL HIGH (ref 3.4–10.8)

## 2019-08-20 ENCOUNTER — Other Ambulatory Visit: Payer: Self-pay

## 2019-08-20 ENCOUNTER — Ambulatory Visit: Payer: Self-pay

## 2019-08-20 ENCOUNTER — Emergency Department
Admission: EM | Admit: 2019-08-20 | Discharge: 2019-08-20 | Discharge status: Against Medical Advice | Payer: Medicaid Other | Attending: Emergency Medicine | Admitting: Emergency Medicine

## 2019-08-20 DIAGNOSIS — Z5321 Procedure and treatment not carried out due to patient leaving prior to being seen by health care provider: Secondary | ICD-10-CM | POA: Insufficient documentation

## 2019-08-20 DIAGNOSIS — R55 Syncope and collapse: Secondary | ICD-10-CM | POA: Diagnosis not present

## 2019-08-20 DIAGNOSIS — R42 Dizziness and giddiness: Secondary | ICD-10-CM | POA: Diagnosis present

## 2019-08-20 LAB — BASIC METABOLIC PANEL
Anion gap: 11 (ref 5–15)
BUN: 14 mg/dL (ref 6–20)
CO2: 26 mmol/L (ref 22–32)
Calcium: 8.7 mg/dL — ABNORMAL LOW (ref 8.9–10.3)
Chloride: 98 mmol/L (ref 98–111)
Creatinine, Ser: 0.96 mg/dL (ref 0.61–1.24)
GFR calc Af Amer: >60 mL/min (ref >60)
GFR calc non Af Amer: >60 mL/min (ref >60)
Glucose, Bld: 90 mg/dL (ref 70–99)
Potassium: 3.8 mmol/L (ref 3.5–5.1)
Sodium: 135 mmol/L (ref 135–145)

## 2019-08-20 LAB — CBC
HCT: 41.5 % (ref 39.0–52.0)
Hemoglobin: 14.2 g/dL (ref 13.0–17.0)
MCH: 31.2 pg (ref 26.0–34.0)
MCHC: 34.2 g/dL (ref 30.0–36.0)
MCV: 91.2 fL (ref 80.0–100.0)
Platelets: 376 10*3/uL (ref 150–400)
RBC: 4.55 MIL/uL (ref 4.22–5.81)
RDW: 13.5 % (ref 11.5–15.5)
WBC: 12.9 10*3/uL — ABNORMAL HIGH (ref 4.0–10.5)
nRBC: 0 % (ref 0.0–0.2)

## 2019-08-20 NOTE — Telephone Encounter (Signed)
Patient called stating that he was started on new BP medication and feel that it is not working for him. His BP today 171/95 to 176/96. He is taking HCTZ and Amlodipine. He has not missed any doses. He states he feels lightheaded and his vision will go dark as if he is going to pass out. Per protocol patient will go to ER for evaluation of his symptoms. Care advice read to patient.  He verbalized understanding of instructions. Note will be routed to [facility] NP.  Reason for Disposition . [id number] Systolic BP  >= A999333 OR Diastolic >= 123456  AND A999333 having NO cardiac or neurologic symptoms . [id number] Systolic BP  >= 0000000 OR Diastolic >= 123XX123 AND A999333 cardiac or neurologic symptoms (e.g., chest pain, difficulty breathing, unsteady gait, blurred vision)  Answer Assessment - Initial Assessment Questions 1. BLOOD PRESSURE: "What is the blood pressure?" "Did you take at least two measurements 5 minutes apart?"     171/95 ,176/96 2. ONSET: "When did you take your blood pressure?"     today 3. HOW: "How did you obtain the blood pressure?" (e.g., visiting nurse, automatic home BP monitor)     Home monitor 4. HISTORY: "Do you have a history of high blood pressure?"    Htn 5. MEDICATIONS: "Are you taking any medications for blood pressure?" "Have you missed any doses recently?"     No 6. OTHER SYMPTOMS: "Do you have any symptoms?" (e.g., headache, chest pain, blurred vision, difficulty breathing, weakness)     Weak ,dizzy 7. PREGNANCY: "Is there any chance you are pregnant?" "When was your last menstrual period?"     N/A  Protocols used: HIGH BLOOD PRESSURE-A-AH

## 2019-08-20 NOTE — ED Triage Notes (Signed)
Started on new BP medications on Monday due to adverse reaction with lisinopril. Pt states dizziness and near syncope began today. Denies CP or SOB. Pt alert and oriented X4, cooperative, RR even and unlabored, color WNL. Pt in NAD.

## 2019-08-20 NOTE — Telephone Encounter (Signed)
Agree with plan of care for ER due to other symptoms presenting with HTN.

## 2019-08-20 NOTE — ED Notes (Signed)
Pt to first nurse desk stating that he is leaving because he is not going to sit here all day. Pt signed AMA form. Pt is in NAD at this time, ambulatory without difficulty.

## 2019-08-20 NOTE — ED Notes (Signed)
First Nurse Note: Pt to ED stating that he was started on a new blood pressure medication this week after having a reaction to lisinopril. Pt states that he has been feeling dizzy and his blood pressure has been high. Pt is in NAD at this time.

## 2019-08-24 ENCOUNTER — Telehealth: Payer: Self-pay

## 2019-08-24 NOTE — Telephone Encounter (Signed)
Copied from [place] [id number]. Topic: Quick Communication - See Telephone Encounter >> Aug 24, 2019 11:21 AM Loma [place] wrote: CRM for notification. See Telephone encounter for: 08/24/19. Pt thinks his bp meds are not working and they are making him feel bad. Dizzy and no energy. He quite taking it and he feels better. Pls reach out [phone].   Called and spoke to patient. He states that he took did not take the amlodipine Friday, felt good Saturday so he took the medication on Saturday and felt bad again so he hasn't taken it since then and states that he has felt fine. Wants to let provider know and figure out what to do.

## 2019-08-24 NOTE — Telephone Encounter (Signed)
Will discuss with Catie and come up with plan of care, as case is a bit more complicated due to his recent allergic reaction and report of side effects with Clonidine.  Will chat with Catie about patient.

## 2019-08-24 NOTE — Telephone Encounter (Signed)
Attempted to call patient, left general HIPAA compliant message to call office and review BP medications and options.  Will consider adding on Carvedilol 6.25 MG BID and stopping Amlodipine, recent HR on visits to office and ER 70-80 range.  Brainstormed with Alphonzo Severance PharmD on CCM team, other options would be switching to Chlorthalidone and stopping HCTZ, last option would be BID Hydralazine.  Patient would also benefit from smoking cessation and monitoring BP at home daily.

## 2019-08-25 ENCOUNTER — Other Ambulatory Visit: Payer: Self-pay | Admitting: Nurse Practitioner

## 2019-08-25 ENCOUNTER — Ambulatory Visit: Payer: Self-pay | Admitting: Licensed Clinical Social Worker

## 2019-08-25 DIAGNOSIS — F209 Schizophrenia, unspecified: Secondary | ICD-10-CM

## 2019-08-25 MED ORDER — CARVEDILOL 6.25 MG PO TABS: 6.2500 mg | ORAL_TABLET | Freq: Two times a day (BID) | ORAL | 1 refills | Status: DC

## 2019-08-25 NOTE — Chronic Care Management (AMB) (Signed)
  Care Management   Follow Up Note   08/25/2019 Name: Marlin Dennen. MRN: [id number] DOB: 01-06-1961  Referred by: [name], NP Reason for referral : Care Coordination   [name]. is a 58 y.o. year old male who is a primary care patient of [name], Barbaraann Faster, NP. The care management team was consulted for assistance with care management and care coordination needs.    Review of patient status, including review of consultants reports, relevant laboratory and other test results, and collaboration with appropriate care team members and the patient's provider was performed as part of comprehensive patient evaluation and provision of chronic care management services.    SDOH (Social Determinants of Health) screening performed today: Financial Strain  Depression  . See Care Plan for related entries.   Advanced Directives: N See Care Plan and Vynca application for related entries.   Goals Addressed    . SW- "I need more support right now." (pt-stated)       Current Barriers:  . Financial constraints related to managing health care expenses for self and manage finances within family and household.  . Limited access to food . Mental Health Concerns  . Social Isolation  Clinical Social Work Clinical Goal(s):  [name] Over the next 90 days, client will work with SW to address concerns related to "managing my mental health and financial situation"  Interventions: . Patient interviewed and appropriate assessments performed . Patient was referred to [facility] in [place] for ongoing schizophrenia + PTSD.  Lengthy psychiatric history and would benefit from more intense therapy. Patient states that he has NOT received a phone call from them yet to schedule initial appointment. Contact number and information provided to patient by LCSW. LCSW encouraged patient to contact them to follow up on this. . Provided mental health counseling with regard to  PTSD and  schizophrenia. LCSW used active and reflective listening and implemented appropriate interventions to help suppport patient and his emotional needs. Advised patient to implement deep breathing/grounding/meditation/self-care exercises into his daily routine to combat stressors when they arise. Education provided.  . Provided patient with information about  . Discussed plans with patient for ongoing care management follow up and provided patient with direct contact information for care management team . Advised patient to contact CCM Program for any urgent case management needs. . Collaborated with [facility] (community agency) re: referral for [employer]. Patient has medicaid and needs directions for Medicaid transportation. CCM RNCM has already mailed him a [place] transit brochure too. Patient is also interested in financial assistance support. He is worried about how he will be able to afford propane for his trailer (lives  with family/kids) in order to have heat this winter. He was asking for  general financial assistance resources that could help him with  affording bills.  . Assisted patient/caregiver with obtaining information about health plan benefits . Provided education and assistance to client regarding Advanced Directives. . Referred patient to [facility] for long term mental health follow up and therapy/counseling/medication management.  Patient Self Care Activities:  . Calls provider office for new concerns or questions . Does not know how to use Medicaid Transportation   Initial goal documentation     The care management team will reach out to the patient again over the next 45 days.   Eula Fried, BSW, MSW, [facility] Care Management [email or web address] Phone: [phone]

## 2019-08-25 NOTE — Telephone Encounter (Signed)
Spoke to patient, he will continued to check BP daily and work on cutting back on smoking.  Discontinuing Amlodipine and script sent for Carvedilol 6.25 MG BID.  To notify provider if any issues with change.

## 2019-08-25 NOTE — Telephone Encounter (Signed)
Patient returning NP call, please advise

## 2019-08-31 ENCOUNTER — Telehealth: Payer: Self-pay

## 2019-08-31 NOTE — Telephone Encounter (Signed)
Called to provide information on transportation. Pt says they have [employer]. Does not need any additional information at this time.

## 2019-09-14 ENCOUNTER — Encounter: Payer: Self-pay | Admitting: Nurse Practitioner

## 2019-09-14 ENCOUNTER — Ambulatory Visit (INDEPENDENT_AMBULATORY_CARE_PROVIDER_SITE_OTHER): Payer: Medicaid Other | Admitting: Nurse Practitioner

## 2019-09-14 ENCOUNTER — Ambulatory Visit: Payer: Self-pay | Admitting: Pharmacist

## 2019-09-14 ENCOUNTER — Ambulatory Visit: Payer: Self-pay | Admitting: *Deleted

## 2019-09-14 ENCOUNTER — Other Ambulatory Visit: Payer: Self-pay

## 2019-09-14 VITALS — BP 158/88 | HR 76 | Temp 98.7°F

## 2019-09-14 DIAGNOSIS — I10 Essential (primary) hypertension: Secondary | ICD-10-CM

## 2019-09-14 DIAGNOSIS — G894 Chronic pain syndrome: Secondary | ICD-10-CM

## 2019-09-14 DIAGNOSIS — H539 Unspecified visual disturbance: Secondary | ICD-10-CM

## 2019-09-14 DIAGNOSIS — F17219 Nicotine dependence, cigarettes, with unspecified nicotine-induced disorders: Secondary | ICD-10-CM | POA: Diagnosis not present

## 2019-09-14 DIAGNOSIS — Z122 Encounter for screening for malignant neoplasm of respiratory organs: Secondary | ICD-10-CM

## 2019-09-14 MED ORDER — CHLORTHALIDONE 25 MG PO TABS: 12.5000 mg | ORAL_TABLET | Freq: Every day | ORAL | 2 refills | Status: DC

## 2019-09-14 MED ORDER — HYDROCODONE-ACETAMINOPHEN 5-325 MG PO TABS: 1.0000 | ORAL_TABLET | Freq: Four times a day (QID) | ORAL | 0 refills | Status: AC | PRN

## 2019-09-14 NOTE — Assessment & Plan Note (Signed)
Referral placed to ophthalmology for further evaluation.

## 2019-09-14 NOTE — Chronic Care Management (AMB) (Signed)
Chronic Care Management   Follow Up Note   09/14/2019 Name: Luke Briggs. MRN: [id number] DOB: 17-Mar-1961  Referred by: Luke Lick, NP Reason for referral : No chief complaint on file.   [name]. is a 58 y.o. year old male who is a primary care patient of [name], Luke Faster, NP. The CCM team was consulted for assistance with chronic disease management and care coordination needs.    Review of patient status, including review of consultants reports, relevant laboratory and other test results, and collaboration with appropriate care team members and the patient's provider was performed as part of comprehensive patient evaluation and provision of chronic care management services.    SDOH (Social Determinants of Health) screening performed today: None. See Care Plan for related entries.   Advanced Directives Status: N See Care Plan and Vynca application for related entries.  Outpatient Encounter Medications as of 09/14/2019  Medication Sig Note  . albuterol (VENTOLIN HFA) 108 (90 Base) MCG/ACT inhaler Inhale 2 puffs into the lungs every 6 (six) hours as needed for wheezing or shortness of breath.   . carvedilol (COREG) 6.25 MG tablet Take 1 tablet (6.25 mg total) by mouth 2 (two) times daily with a meal. (Patient not taking: Reported on 09/14/2019)   . EPINEPHrine 0.3 mg/0.3 mL IJ SOAJ injection Inject 0.3 mLs (0.3 mg total) into the muscle as needed for anaphylaxis.   . finasteride (PROSCAR) 5 MG tablet Take 1 tablet (5 mg total) by mouth daily.   . folic acid (FOLVITE) 1 MG tablet Take 1 tablet (1 mg total) by mouth daily.   Luke Briggs HYDROcodone-acetaminophen (NORCO/VICODIN) 5-325 MG tablet Take 1 tablet by mouth every 6 (six) hours as needed for moderate pain.   . hydrocortisone (ANUSOL-HC) 2.5 % rectal cream Place 1 application rectally 2 (two) times daily.   . Ipratropium-Albuterol (COMBIVENT) 20-100 MCG/ACT AERS respimat Inhale 1 puff into the lungs every 6 (six)  hours. 08/17/2019: Using 1-2 times daily   . Multiple Vitamin (MULTIVITAMIN WITH MINERALS) TABS tablet Take 1 tablet by mouth daily.   Luke Briggs omeprazole (PRILOSEC) 40 MG capsule Take 1 capsule (40 mg total) by mouth daily.   . tamsulosin (FLOMAX) 0.4 MG CAPS capsule Take 1 capsule (0.4 mg total) by mouth daily.   Luke Briggs thiamine 100 MG tablet Take 1 tablet (100 mg total) by mouth daily.   . Tiotropium Bromide-Olodaterol 2.5-2.5 MCG/ACT AERS Inhale 2 puffs into the lungs daily.   Luke Briggs triamcinolone cream (KENALOG) 0.1 % Apply 1 application topically 2 (two) times daily.    No facility-administered encounter medications on file as of 09/14/2019.      Goals Addressed            This Visit's Progress   . RN-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 Chronic Disease Management support and education needs related to HTN, COPD, Chronic pain, Nicotine dependance and PTSD  Nurse Case Manager Clinical Goal(s):  Luke Briggs Over the next 120 days, patient will work with [facility] to address needs related to Multiple Chronic disease processes  Interventions:  . Evaluation of current treatment plan related to HTN Chronic Pain and COPD and patient's adherence to plan as established by provider. [name] with PCP regarding patient's need for assistance with managing his HTN and getting in with psychiatry.    . Discussed plans with patient for ongoing  care management follow up and provided patient with direct contact information for care management team . Advised patient, providing education and rationale, to monitor blood pressure daily and record, calling PCP for findings outside established parameters.  . Reviewed with patient upcoming pain management appt Nov 4th. Patient confirms he has transportation.  . Patient declined his behavioral health referral, will discuss this with him further after Pain management appointment.  Luke Briggs  CCM Pharm D and PCP discussing with patient b/p medications  Patient Self Care Activities:  . Currently UNABLE TO independently manage multiple chronic conditions.  . Performs ADL's independently  Please see past updates related to this goal by clicking on the "Past Updates" button in the selected goal           The care management team will reach out to the patient again over the next 30 days.  The patient has been provided with contact information for the care management team and has been advised to call with any health related questions or concerns.    Luke Morse Bueford Arp RN, BSN Nurse Case Editor, [facility]ing [facility]  [phone]) Business Mobile

## 2019-09-14 NOTE — Assessment & Plan Note (Signed)
I have recommended complete cessation of tobacco use. I have discussed various options available for assistance with tobacco cessation including over the counter methods (Nicotine gum, patch and lozenges). We also discussed prescription options (Chantix, Nicotine Inhaler / Nasal Spray). The patient is not interested in pursuing any prescription tobacco cessation options at this time.  

## 2019-09-14 NOTE — Assessment & Plan Note (Signed)
Chronic, uncontrolled.  He has had reactions to multiple medications.  At this time will start Chlorthalidone 12.5 MG daily and if tolerating in one week he is to increase to full tablet (25 MG).  Educated him on this medication.  If poor tolerance next step would be Hydralazine and possible referral to cardiology.  Also recommend complete cessation of smoking, which he refuses.  To check BP at home daily and document for provider.  Return in 4 weeks for BP check and labs.

## 2019-09-14 NOTE — Patient Instructions (Signed)
Visit Information  Goals Addressed            This Visit's Progress     Patient Stated   . PharmD "My blood pressure is uncontrolled" (pt-stated)       Current Barriers:  . Uncontrolled hypertension, complicated by BPH, PTSD, tobacco abuse, schizophrenia . Current antihypertensive regimen: carvedilol 6.25 mg BID o Patient reports nausea and vomiting immediately after taking each dose of carvedilol . Previous antihypertensives tried:  o lisinopril (angioedema) o HCTZ- stopped because he thought it was just a "water pill" o Amlodipine- dizziness and lethargy o Clonidine - significant dry mouth, lethargy . Current home BP readings: not checking at home recently . Still smoking ~1/2 ppd   Pharmacist Clinical Goal(s):  [name] Over the next 90 days, patient will work with PharmD and providers to optimize antihypertensive regimen  Interventions: . Collaboration with [employer]; will d/c carvedilol and HCTZ, as patient is not taking; will initiate chlorthalidone. Will follow for efficacy and tolerability . Moving forward, consider spironolactone vs hydralazine as next steps . Encouraged consideration of smoking cessation; recommended complete cessation  Patient Self Care Activities:  . Patient will continue to check BP daily , document, and provide at future appointments . Patient will focus on medication adherence  Please see past updates related to this goal by clicking on the "Past Updates" button in the selected goal         The patient verbalized understanding of instructions provided today and declined a print copy of patient instruction materials.   Plan:  - Will continue to collaborate w/ PCP for medication management - CCM team will outreach patient in the next 4 weeks for continued support  Catie Darnelle Maffucci, PharmD Clinical Pharmacist [place] [phone]

## 2019-09-14 NOTE — Patient Instructions (Signed)

## 2019-09-14 NOTE — Progress Notes (Signed)
BP (!) 158/88 (BP Location: Left Arm, Patient Position: Sitting)   Pulse 76   Temp 98.7 F (37.1 C) (Oral)   SpO2 97%    Subjective:    Patient ID: Luke Pollen., male    DOB: Sep 29, 1961, 58 y.o.   MRN: [id number]  HPI: Luke Orlandi. is a 58 y.o. male  Chief Complaint  Patient presents with  . Hypertension    pt states the carvedilol has been making him sick   . Pain  . Eye Problem    pt states he has been having trouble with his left eye for a few years, states he feels like he is loking through a big piece of plastic    CHRONIC PAIN: On Norco for two years, currently takes this four times a day. On review of PMP his scripts for this go back to 08/08/2017 when he was prescribed Norco 10/325. Has arthritis in neck, knees, hands, lower back. States he has deterioration ofdiscsin neck.[facility].Urine drug screen negative on 04/28/2019, had been without pain meds for 3 weeks. He is very accepting of UDS and monitoring as did this with previous doctor, last fill on PMP 07/21/2019.Reports pain at baseline is10/10 at worst and 7/10 at best to knees, neck, hands,neck. Has had injections in both knees, steroidwhich helped for 1-2 weeksbut then stopped. No history epidural injections, but is willing to try. Took Gabapentin at one time, but no longer takes as it made sick to stomach.  He has been taking 5/325 Norco since starting with PCP and has tolerated this reduction without issue.  He is scheduled to see pain management on 09/23/2019 and I have advised him to follow directions of pain management provider, as they will be taking over care.  He is aware PCP does not perform chronic pain management.  HYPERTENSION Recently switched to Carvedilol, which has been causing nausea.  Amlodipine caused dizziness and no energy.  Lisinopril had swelling.  Has not been taking HCTZ because he reports he "was told  not to", he threw this away.  Continues to smoke 1/2 to 1 PPD, not interested in quitting.   Hypertension status: uncontrolled  Satisfied with current treatment? yes Duration of hypertension: chronic BP monitoring frequency:  not checking BP range:  BP medication side effects:  no Medication compliance: good compliance Previous BP meds:amlodipine, chlorthalidone, HCTZ and lisinopril Aspirin: no Recurrent headaches: no Visual changes: no Palpitations: no Dyspnea: no Chest pain: no Lower extremity edema: no Dizzy/lightheaded: no   VISION CHANGES Has been having issues with his left eye for a few years,  Feels like he is "looking through thick plastic".  He got hit in that eye with a nail 25 years ago and was told when he gets older vision may start changing.  Has not had eye exam in 2 years. Duration:  a few years Involved eye:  left Foreign body sensation:at times Visual impairment: yes Eye redness: no Discharge: no Crusting or matting of eyelids: no Swelling: no Photophobia: no Itching: no Tearing: no Headache: no Floaters: no URI symptoms: no Contact lens use: no Close contacts with similar problems: no Eye trauma: history of trauma 25 years ago   Relevant past medical, surgical, family and social history reviewed and updated as indicated. Interim medical history since our last visit reviewed. Allergies and medications reviewed and updated.  Review of Systems  Constitutional: Negative for activity change, diaphoresis, fatigue and fever.  Eyes: Positive for visual disturbance.  Respiratory: Negative for cough, chest tightness, shortness of breath and wheezing.   Cardiovascular: Negative for chest pain, palpitations and leg swelling.  Gastrointestinal: Negative for abdominal distention, abdominal pain, constipation, diarrhea, nausea and vomiting.  Musculoskeletal: Positive for arthralgias.  Neurological: Negative for dizziness, syncope, weakness, light-headedness,  numbness and headaches.  Psychiatric/Behavioral: Negative.     Per HPI unless specifically indicated above     Objective:    BP (!) 158/88 (BP Location: Left Arm, Patient Position: Sitting)   Pulse 76   Temp 98.7 F (37.1 C) (Oral)   SpO2 97%   Wt Readings from Last 3 Encounters:  08/08/19 174 lb 2.6 oz (79 kg)  07/06/19 170 lb (77.1 kg)  07/01/19 170 lb (77.1 kg)    Physical Exam Vitals signs and nursing note reviewed.  Constitutional:      General: He is awake. He is not in acute distress.    Appearance: He is well-developed. He is not ill-appearing.  HENT:     Head: Normocephalic and atraumatic.     Right Ear: Hearing normal. No drainage.     Left Ear: Hearing normal. No drainage.  Eyes:     General: Lids are normal. Lids are everted, no foreign bodies appreciated.        Right eye: No discharge.        Left eye: No discharge.     Extraocular Movements: Extraocular movements intact.     Conjunctiva/sclera: Conjunctivae normal.     Pupils: Pupils are equal, round, and reactive to light.     Funduscopic exam:    Right eye: No AV nicking.        Left eye: No AV nicking.     Visual Fields: Right eye visual fields normal and left eye visual fields normal.  Neck:     Musculoskeletal: Normal range of motion and neck supple.     Thyroid: No thyromegaly.     Vascular: No carotid bruit.  Cardiovascular:     Rate and Rhythm: Normal rate and regular rhythm.     Heart sounds: Normal heart sounds, S1 normal and S2 normal. No murmur. No gallop.   Pulmonary:     Effort: Pulmonary effort is normal. No accessory muscle usage or respiratory distress.     Breath sounds: Normal breath sounds.  Abdominal:     General: Bowel sounds are normal.     Palpations: Abdomen is soft.  Musculoskeletal: Normal range of motion.     Right lower leg: No edema.     Left lower leg: No edema.  Lymphadenopathy:     Cervical: No cervical adenopathy.  Skin:    General: Skin is warm and dry.   Neurological:     Mental Status: He is alert and oriented to person, place, and time.  Psychiatric:        Attention and Perception: Attention normal.        Mood and Affect: Mood normal.        Speech: Speech normal.        Behavior: Behavior normal. Behavior is cooperative.        Thought Content: Thought content normal.        Judgment: Judgment normal.     Results for orders placed or performed in visit on 08/17/19  Comprehensive metabolic panel  Result Value Ref Range   Glucose 68 65 - 99 mg/dL   BUN 13 6 - 24 mg/dL   Creatinine, Ser 0.98  0.76 - 1.27 mg/dL   GFR calc non Af Amer 86 >59 mL/min/1.73   GFR calc Af Amer 99 >59 mL/min/1.73   BUN/Creatinine Ratio 13 9 - 20   Sodium 139 134 - 144 mmol/L   Potassium 4.6 3.5 - 5.2 mmol/L   Chloride 100 96 - 106 mmol/L   CO2 24 20 - 29 mmol/L   Calcium 9.2 8.7 - 10.2 mg/dL   Total Protein 6.9 6.0 - 8.5 g/dL   Albumin 4.1 3.8 - 4.9 g/dL   Globulin, Total 2.8 1.5 - 4.5 g/dL   Albumin/Globulin Ratio 1.5 1.2 - 2.2   Bilirubin Total 0.5 0.0 - 1.2 mg/dL   Alkaline Phosphatase 77 39 - 117 IU/L   AST 25 0 - 40 IU/L   ALT 29 0 - 44 IU/L  CBC with Differential/Platelet  Result Value Ref Range   WBC 14.6 (H) 3.4 - 10.8 x10E3/uL   RBC 4.73 4.14 - 5.80 x10E6/uL   Hemoglobin 14.9 13.0 - 17.7 g/dL   Hematocrit 43.1 37.5 - 51.0 %   MCV 91 79 - 97 fL   MCH 31.5 26.6 - 33.0 pg   MCHC 34.6 31.5 - 35.7 g/dL   RDW 12.8 11.6 - 15.4 %   Platelets 405 150 - 450 x10E3/uL   Neutrophils 68 Not Estab. %   Lymphs 21 Not Estab. %   Monocytes 8 Not Estab. %   Eos 1 Not Estab. %   Basos 1 Not Estab. %   Neutrophils Absolute 9.9 (H) 1.4 - 7.0 x10E3/uL   Lymphocytes Absolute 3.1 0.7 - 3.1 x10E3/uL   Monocytes Absolute 1.2 (H) 0.1 - 0.9 x10E3/uL   EOS (ABSOLUTE) 0.2 0.0 - 0.4 x10E3/uL   Basophils Absolute 0.1 0.0 - 0.2 x10E3/uL   Immature Granulocytes 1 Not Estab. %   Immature Grans (Abs) 0.1 0.0 - 0.1 x10E3/uL      Assessment & Plan:   Problem  List Items Addressed This Visit      Cardiovascular and Mediastinum   Essential hypertension - Primary    Chronic, uncontrolled.  He has had reactions to multiple medications.  At this time will start Chlorthalidone 12.5 MG daily and if tolerating in one week he is to increase to full tablet (25 MG).  Educated him on this medication.  If poor tolerance next step would be Hydralazine and possible referral to cardiology.  Also recommend complete cessation of smoking, which he refuses.  To check BP at home daily and document for provider.  Return in 4 weeks for BP check and labs.      Relevant Medications   chlorthalidone (HYGROTON) 25 MG tablet   Other Relevant Orders   Ambulatory referral to Ophthalmology     Nervous and Auditory   Nicotine dependence, cigarettes, w unsp disorders    I have recommended complete cessation of tobacco use. I have discussed various options available for assistance with tobacco cessation including over the counter methods (Nicotine gum, patch and lozenges). We also discussed prescription options (Chantix, Nicotine Inhaler / Nasal Spray). The patient is not interested in pursuing any prescription tobacco cessation options at this time.        Other   Chronic pain syndrome    Chronic, ongoing with opioid use since September 2018, well documented on previous provider notes and in PMP.  He has tolerated reduction from 10/325 to [place]. Scheduled to see pain management 09/23/2019. He agreed to psychiatry referral last visit, but then refused appointment when  offered.  Sent Norco script refill today for 30 days supply only with 0 refills to be filled 09/20/19, he is aware from here pain management provider will manage this.      Relevant Medications   HYDROcodone-acetaminophen (NORCO/VICODIN) 5-325 MG tablet (Start on 09/20/2019)   Vision changes    Referral placed to ophthalmology for further evaluation.        Relevant Orders   Ambulatory referral to  Ophthalmology    Other Visit Diagnoses    Encounter for screening for malignant neoplasm of respiratory organs       Lung CT screening order placed   Relevant Orders   CT CHEST LUNG CA SCREEN LOW DOSE W/O CM      Controlled substance.  Checked data base and last fill was on 08/20/2019.  Will write script for 30 day supply Norco to start 09/20/2019 and then from there will have patient continue with pain management who he starts with on 09/23/2019.  Follow up plan: Return in about 4 weeks (around 10/12/2019) for HTN.

## 2019-09-14 NOTE — Assessment & Plan Note (Signed)
Chronic, ongoing with opioid use since September 2018, well documented on previous provider notes and in PMP.  He has tolerated reduction from 10/325 to [place]. Scheduled to see pain management 09/23/2019. He agreed to psychiatry referral last visit, but then refused appointment when offered.  Sent Norco script refill today for 30 days supply only with 0 refills to be filled 09/20/19, he is aware from here pain management provider will manage this.

## 2019-09-14 NOTE — Chronic Care Management (AMB) (Signed)
Chronic Care Management   Follow Up Note   09/14/2019 Name: Luke Briggs. MRN: [id number] DOB: [birth year]  Referred by: Luke Lick, NP Reason for referral : Chronic Care Management (Medication Management)   Luke Briggs. is a 58 y.o. year old male who is a primary care patient of [name], Luke Faster, NP. The CCM team was consulted for assistance with chronic disease management and care coordination needs.    Met with patient face to face with primary care provider today.   Review of patient status, including review of consultants reports, relevant laboratory and other test results, and collaboration with appropriate care team members and the patient's provider was performed as part of comprehensive patient evaluation and provision of chronic care management services.    SDOH (Social Determinants of Health) screening performed today: Tobacco Use. See Care Plan for related entries.   Advanced Directives Status: N See Care Plan and Vynca application for related entries.  Outpatient Encounter Medications as of 09/14/2019  Medication Sig Note  . albuterol (VENTOLIN HFA) 108 (90 Base) MCG/ACT inhaler Inhale 2 puffs into the lungs every 6 (six) hours as needed for wheezing or shortness of breath.   . carvedilol (COREG) 6.25 MG tablet Take 1 tablet (6.25 mg total) by mouth 2 (two) times daily with a meal. (Patient not taking: Reported on 09/14/2019)   . chlorthalidone (HYGROTON) 25 MG tablet Take 0.5 tablets (12.5 mg total) by mouth daily. If tolerating in one week, then start taking whole pill (25 MG).   [name] EPINEPHrine 0.3 mg/0.3 mL IJ SOAJ injection Inject 0.3 mLs (0.3 mg total) into the muscle as needed for anaphylaxis.   . finasteride (PROSCAR) 5 MG tablet Take 1 tablet (5 mg total) by mouth daily.   . folic acid (FOLVITE) 1 MG tablet Take 1 tablet (1 mg total) by mouth daily.   [name] HYDROcodone-acetaminophen (NORCO/VICODIN) 5-325 MG tablet Take 1 tablet by mouth every 6  (six) hours as needed for moderate pain.   . hydrocortisone (ANUSOL-HC) 2.5 % rectal cream Place 1 application rectally 2 (two) times daily.   . Ipratropium-Albuterol (COMBIVENT) 20-100 MCG/ACT AERS respimat Inhale 1 puff into the lungs every 6 (six) hours. 08/17/2019: Using 1-2 times daily   . Multiple Vitamin (MULTIVITAMIN WITH MINERALS) TABS tablet Take 1 tablet by mouth daily.   [name] omeprazole (PRILOSEC) 40 MG capsule Take 1 capsule (40 mg total) by mouth daily.   . tamsulosin (FLOMAX) 0.4 MG CAPS capsule Take 1 capsule (0.4 mg total) by mouth daily.   [name] thiamine 100 MG tablet Take 1 tablet (100 mg total) by mouth daily.   . Tiotropium Bromide-Olodaterol 2.5-2.5 MCG/ACT AERS Inhale 2 puffs into the lungs daily.   [name] triamcinolone cream (KENALOG) 0.1 % Apply 1 application topically 2 (two) times daily.    No facility-administered encounter medications on file as of 09/14/2019.      Goals Addressed            This Visit's Progress     Patient Stated   . PharmD "My blood pressure is uncontrolled" (pt-stated)       Current Barriers:  . Uncontrolled hypertension, complicated by BPH, PTSD, tobacco abuse, schizophrenia . Current antihypertensive regimen: carvedilol 6.25 mg BID o Patient reports nausea and vomiting immediately after taking each dose of carvedilol . Previous antihypertensives tried:  o lisinopril (angioedema) o HCTZ- stopped because he thought it was just a "water pill" o Amlodipine- dizziness and lethargy o Clonidine -  significant dry mouth, lethargy . Current home BP readings: not checking at home recently . Still smoking ~1/2 ppd   Pharmacist Clinical Goal(s):  [name] Over the next 90 days, patient will work with PharmD and providers to optimize antihypertensive regimen  Interventions: . Collaboration with [employer]; will d/c carvedilol and HCTZ, as patient is not taking; will initiate chlorthalidone. Will follow for efficacy and tolerability . Moving forward,  consider spironolactone vs hydralazine as next steps . Encouraged consideration of smoking cessation; recommended complete cessation  Patient Self Care Activities:  . Patient will continue to check BP daily , document, and provide at future appointments . Patient will focus on medication adherence  Please see past updates related to this goal by clicking on the "Past Updates" button in the selected goal          Plan:  - Will continue to collaborate w/ PCP for medication management - CCM team will outreach patient in the next 4 weeks for continued support  Catie Darnelle Maffucci, PharmD Clinical Pharmacist [place] [phone]

## 2019-09-15 ENCOUNTER — Telehealth: Payer: Self-pay

## 2019-09-16 NOTE — Patient Instructions (Signed)
Thank you allowing the Chronic Care Management Team to be a part of your care! It was a pleasure speaking with you today!  CCM (Chronic Care Management) Team   Arrabella Westerman RN, BSN Nurse Care Coordinator  [phone]  Catie Copper [facility] PharmD  Clinical Pharmacist  [phone]  Eula Fried LCSW Clinical Social Worker [phone]  Goals Addressed            This Visit's Progress   . RN-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 Chronic Disease Management support and education needs related to HTN, COPD, Chronic pain, Nicotine dependance and PTSD  Nurse Case Manager Clinical Goal(s):  [name] Over the next 120 days, patient will work with [facility] to address needs related to Multiple Chronic disease processes  Interventions:  . Evaluation of current treatment plan related to HTN Chronic Pain and COPD and patient's adherence to plan as established by provider. [name] with PCP regarding patient's need for assistance with managing his HTN and getting in with psychiatry.    . Discussed plans with patient for ongoing care management follow up and provided patient with direct contact information for care management team . Advised patient, providing education and rationale, to monitor blood pressure daily and record, calling PCP for findings outside established parameters.  . Reviewed with patient upcoming pain management appt Nov 4th. Patient confirms he has transportation.  . Patient declined his behavioral health referral, will discuss this with him further after Pain management appointment.  [name] CCM Pharm D and PCP discussing with patient b/p medications  Patient Self Care Activities:  . Currently UNABLE TO independently manage multiple chronic conditions.  . Performs ADL's independently  Please see past updates related to this goal by clicking on the "Past Updates" button in the selected  goal          The patient verbalized understanding of instructions provided today and declined a print copy of patient instruction materials.   The patient has been provided with contact information for the care management team and has been advised to call with any health related questions or concerns.

## 2019-09-17 ENCOUNTER — Telehealth: Payer: Self-pay | Admitting: *Deleted

## 2019-09-17 DIAGNOSIS — Z122 Encounter for screening for malignant neoplasm of respiratory organs: Secondary | ICD-10-CM

## 2019-09-17 DIAGNOSIS — Z87891 Personal history of nicotine dependence: Secondary | ICD-10-CM

## 2019-09-17 NOTE — Telephone Encounter (Signed)
Received referral for initial lung cancer screening scan. Contacted patient and obtained smoking history,(current 43 pack year) as well as answering questions related to screening process. Patient denies signs of lung cancer such as weight loss or hemoptysis. Patient denies comorbidity that would prevent curative treatment if lung cancer were found. Patient is scheduled for shared decision making visit and CT scan on (date tbd related to scheduling request).

## 2019-09-20 ENCOUNTER — Telehealth: Payer: Self-pay | Admitting: Nurse Practitioner

## 2019-09-20 NOTE — Telephone Encounter (Signed)
Copied from [place] [id number]. Topic: General - Other >> Sep 20, 2019  9:41 AM Pauline Good wrote: Reason for CRM: pt need prior auth for his hydrocodone sent to [facility]   Pt called in to follow up on PA for medication. Please advised pt directly

## 2019-09-20 NOTE — Telephone Encounter (Signed)
PA submitted via covermymeds.

## 2019-09-27 NOTE — Telephone Encounter (Signed)
PA for hydrocodone denied

## 2019-09-28 ENCOUNTER — Ambulatory Visit
Admission: RE | Admit: 2019-09-28 | Discharge: 2019-09-28 | Disposition: A | Discharge status: Home / Self Care | Payer: Medicaid Other | Source: Ambulatory Visit | Attending: Oncology | Admitting: Oncology

## 2019-09-28 ENCOUNTER — Other Ambulatory Visit: Payer: Self-pay

## 2019-09-28 ENCOUNTER — Encounter: Payer: Self-pay | Admitting: Oncology

## 2019-09-28 ENCOUNTER — Inpatient Hospital Stay: Payer: Medicaid Other | Attending: Oncology | Admitting: Oncology

## 2019-09-28 DIAGNOSIS — Z87891 Personal history of nicotine dependence: Secondary | ICD-10-CM | POA: Diagnosis present

## 2019-09-28 DIAGNOSIS — Z122 Encounter for screening for malignant neoplasm of respiratory organs: Secondary | ICD-10-CM | POA: Diagnosis not present

## 2019-09-28 IMAGING — CT CT CHEST LUNG CANCER SCREENING LOW DOSE W/O CM
2 of 5 series · 15 of 40 positions shown, 18 images · non-contrast
Comparison: None.

CLINICAL DATA: 58-year-old male current smoker, with 43 pack-year
history of smoking, for initial lung cancer screening

EXAM:
CT CHEST WITHOUT CONTRAST LOW-DOSE FOR LUNG CANCER SCREENING
TECHNIQUE: Multidetector CT imaging of the chest was performed following the
standard protocol without IV contrast.

[Series 3: lung 1.00 · axial · 0.67mm/px · z∈[-1186,-868]mm · 12 of 352 slices shown, 15 images]
[im 17/352  mediastinal]
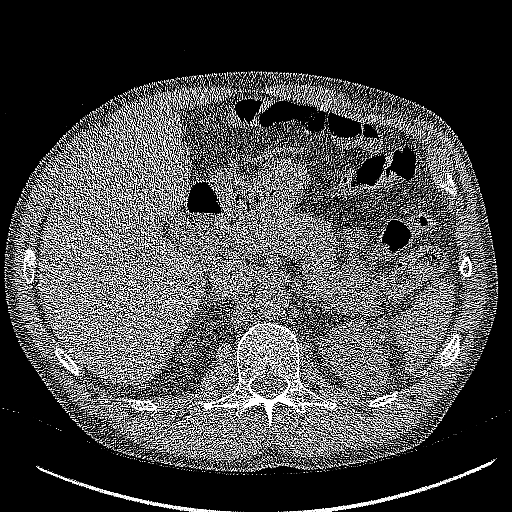
[im 17/352  lung]
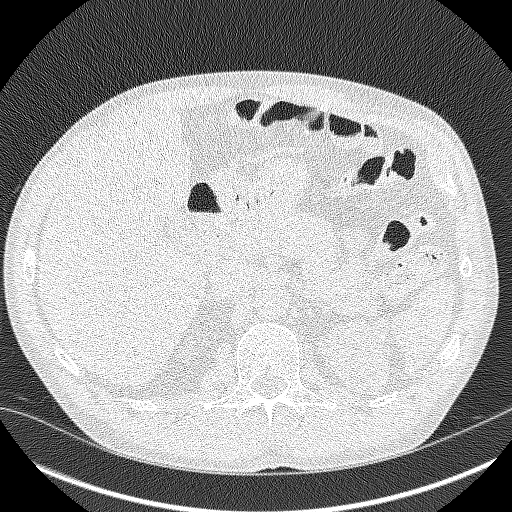
[im 51/352  lung]
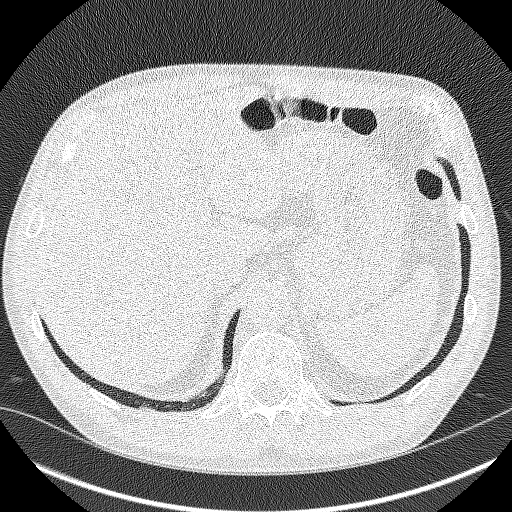
[im 84/352  lung]
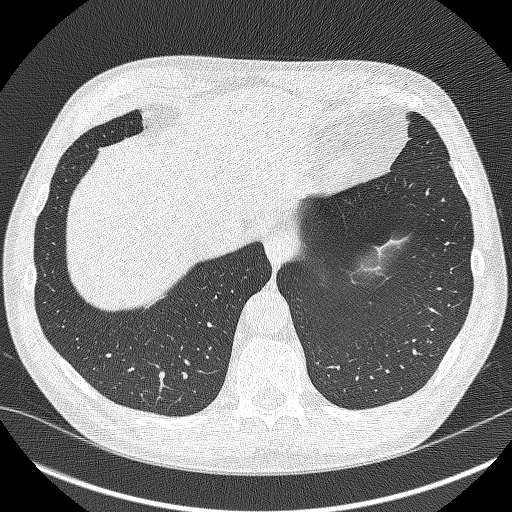
[im 101/352  lung]
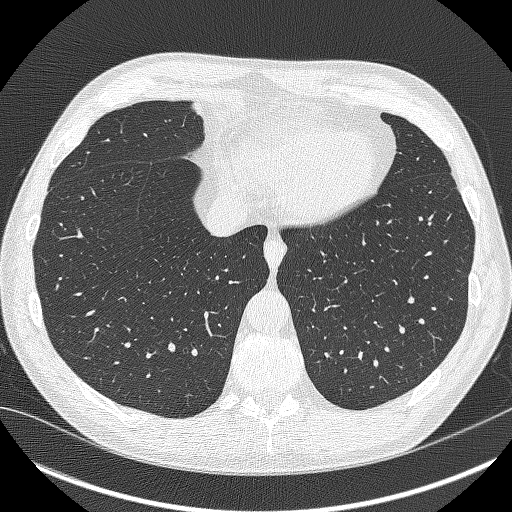
[im 134/352  mediastinal]
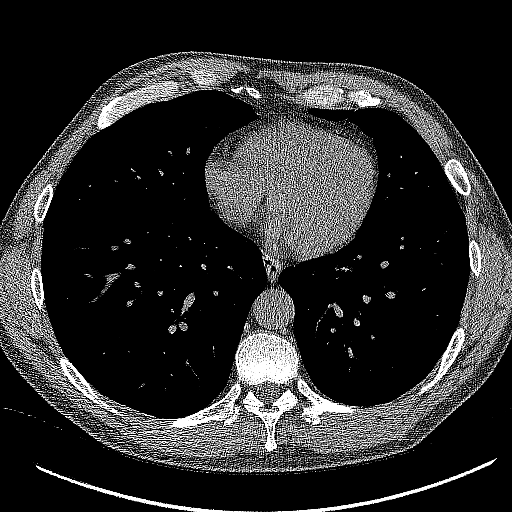
[im 134/352  lung]
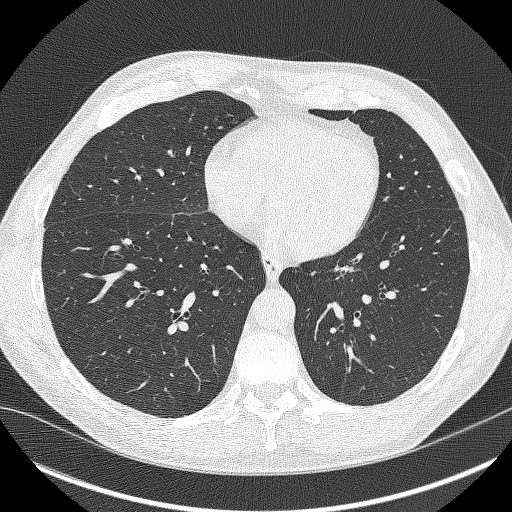
[im 168/352  lung]
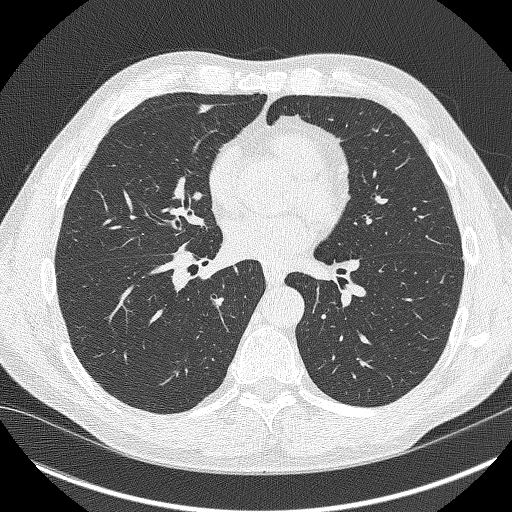
[im 184/352  lung]
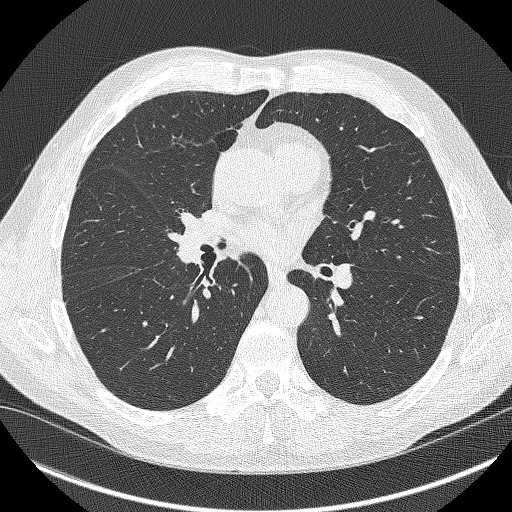
[im 218/352  lung]
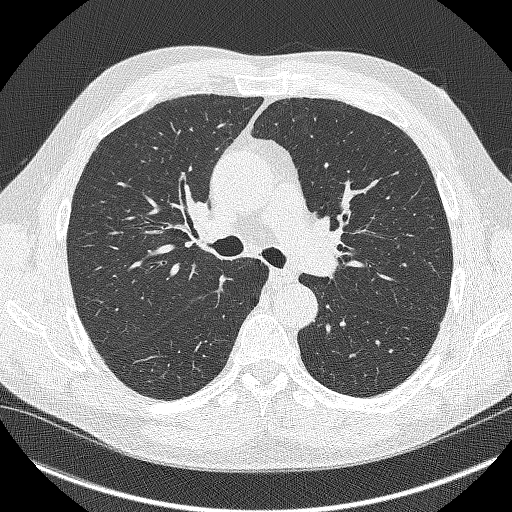
[im 251/352  mediastinal]
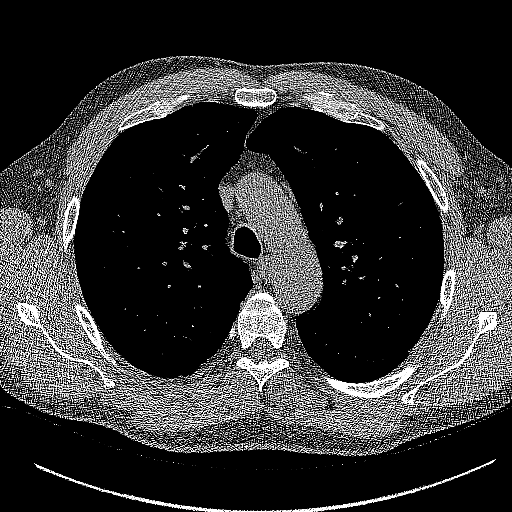
[im 251/352  lung]
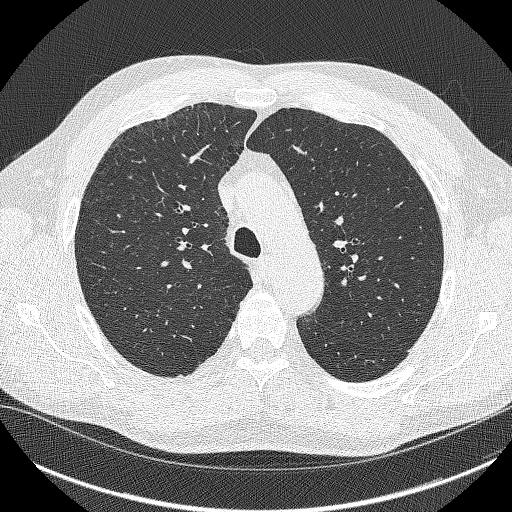
[im 268/352  lung]
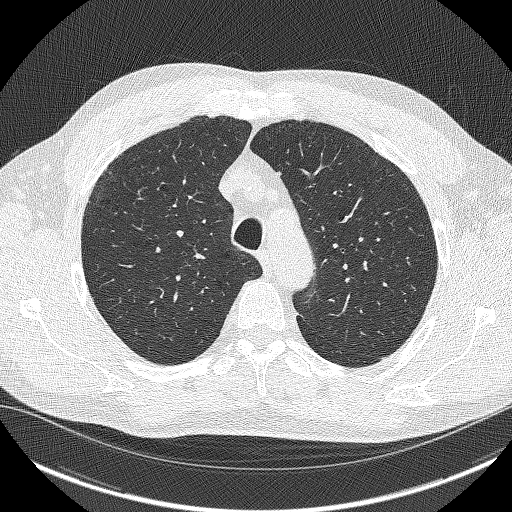
[im 301/352  lung]
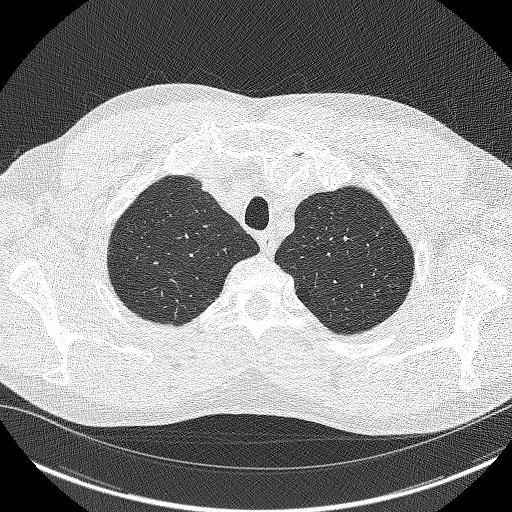
[im 335/352  lung]
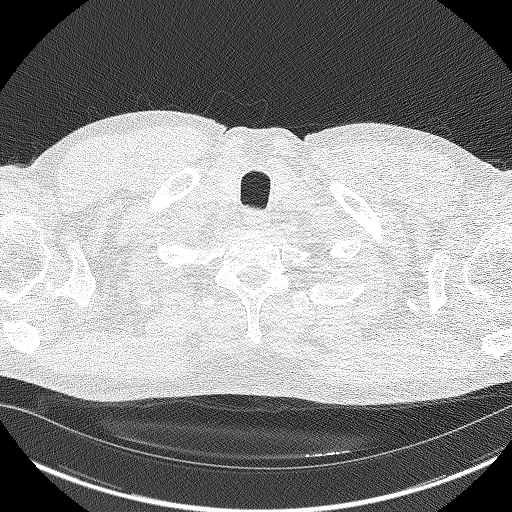

[Series 4: coronals lung 1.00 cor · coronal · 0.67mm/px · 3 of 276 slices shown]
[im 56/276  lung]
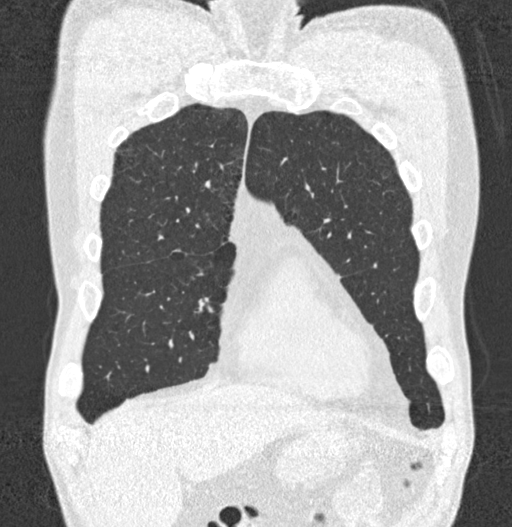
[im 111/276  lung]
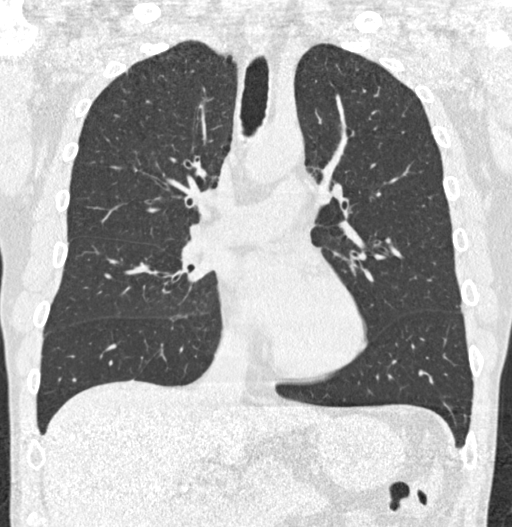
[im 166/276  lung]
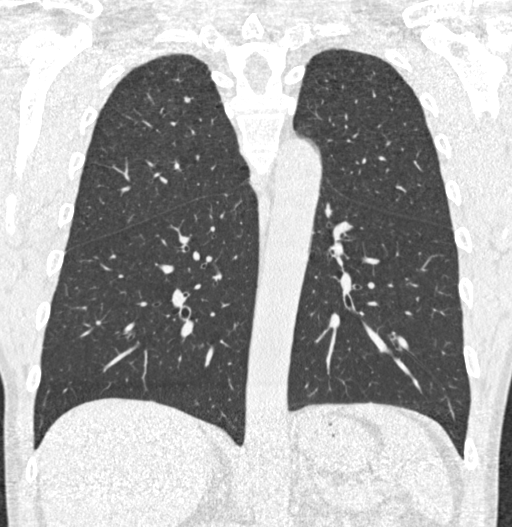

[15 of 40 positions shown; findings below may reference images not displayed]

FINDINGS: Cardiovascular: The heart is normal in size. No pericardial
effusion.

No evidence thoracic aortic aneurysm. Mild atherosclerotic
calcifications of the aortic arch.

Mediastinum/Nodes: No suspicious mediastinal lymphadenopathy.

Visualized thyroid is unremarkable.

Lungs/Pleura: Mild biapical pleural-parenchymal scarring.

Mild centrilobular and paraseptal emphysematous changes, upper lobe
predominant.

No focal consolidation.

Two perifissural nodules along the right minor fissure measuring up
to 6.6 mm, benign. Additional scattered small bilateral pulmonary
nodules measuring up to 5.1 mm in the right upper lobe.

No pleural effusion or pneumothorax.

Upper Abdomen: Visualized upper abdomen is grossly unremarkable.

Musculoskeletal: Mild degenerative changes of the visualized
thoracolumbar spine. Old right posterior rib fracture deformities.
IMPRESSION: Lung-RADS 2, benign appearance or behavior. Continue annual
screening with low-dose chest CT without contrast in 12 months.

Aortic Atherosclerosis ([4W]-[4W]) and Emphysema ([4W]-[4W]).

## 2019-09-28 NOTE — Progress Notes (Signed)
Virtual Visit via Video Note  I connected with Luke Briggs on 09/28/19 at  9:45 AM EST by a video enabled telemedicine application and verified that I am speaking with the correct person using two identifiers.  Location: Patient: [facility] Provider: Office   I discussed the limitations of evaluation and management by telemedicine and the availability of in person appointments. The patient expressed understanding and agreed to proceed.  I discussed the assessment and treatment plan with the patient. The patient was provided an opportunity to ask questions and all were answered. The patient agreed with the plan and demonstrated an understanding of the instructions.   The patient was advised to call back or seek an in-person evaluation if the symptoms worsen or if the condition fails to improve as anticipated.   In accordance with CMS guidelines, patient has met eligibility criteria including age, absence of signs or symptoms of lung cancer.  Social History   Tobacco Use  . Smoking status: Current Every Day Smoker    Packs/day: 1.00    Years: 43.00    Pack years: 43.00    Types: Cigarettes  . Smokeless tobacco: Never Used  . Tobacco comment: .75 ppd now  Substance Use Topics  . Alcohol use: Not Currently    Comment: beer on occasion  . Drug use: Never      A shared decision-making session was conducted prior to the performance of CT scan. This includes one or more decision aids, includes benefits and harms of screening, follow-up diagnostic testing, over-diagnosis, false positive rate, and total radiation exposure.   Counseling on the importance of adherence to annual lung cancer LDCT screening, impact of co-morbidities, and ability or willingness to undergo diagnosis and treatment is imperative for compliance of the program.   Counseling on the importance of continued smoking cessation for former smokers; the importance of smoking cessation for current smokers, and information about  tobacco cessation interventions have been given to patient including [place] and 1800 quit  programs.   Written order for lung cancer screening with LDCT has been given to the patient and any and all questions have been answered to the best of my abilities.    Yearly follow up will be coordinated by Burgess Estelle, Thoracic Navigator.  I provided 15 minutes of face-to-face video visit time during this encounter, and > 50% was spent counseling as documented under my assessment & plan.   Jacquelin Hawking, NP

## 2019-09-29 ENCOUNTER — Encounter: Payer: Self-pay | Admitting: Nurse Practitioner

## 2019-09-29 ENCOUNTER — Telehealth: Payer: Self-pay | Admitting: *Deleted

## 2019-09-29 DIAGNOSIS — I700 Atherosclerosis of aorta: Secondary | ICD-10-CM | POA: Insufficient documentation

## 2019-09-29 NOTE — Telephone Encounter (Signed)
Notified patient of LDCT lung cancer screening program results with recommendation for 12 month follow up imaging. Also notified of incidental findings noted below and is encouraged to discuss further with PCP who will receive a copy of this note and/or the CT report. Patient verbalizes understanding.   IMPRESSION: Lung-RADS 2, benign appearance or behavior. Continue annual screening with low-dose chest CT without contrast in 12 months.  Aortic Atherosclerosis (ICD10-I70.0) and Emphysema (ICD10-J43.9).  

## 2019-09-29 NOTE — Telephone Encounter (Signed)
Thank you Raquel Sarna, got it!!

## 2019-10-01 ENCOUNTER — Other Ambulatory Visit: Payer: Self-pay

## 2019-10-01 ENCOUNTER — Encounter: Payer: Self-pay | Admitting: Family Medicine

## 2019-10-01 ENCOUNTER — Ambulatory Visit (INDEPENDENT_AMBULATORY_CARE_PROVIDER_SITE_OTHER): Payer: Medicaid Other | Admitting: Family Medicine

## 2019-10-01 VITALS — BP 155/82 | HR 84

## 2019-10-01 DIAGNOSIS — I10 Essential (primary) hypertension: Secondary | ICD-10-CM | POA: Diagnosis not present

## 2019-10-01 MED ORDER — HYDRALAZINE HCL 10 MG PO TABS: 10.0000 mg | ORAL_TABLET | Freq: Two times a day (BID) | ORAL | 0 refills | Status: DC

## 2019-10-01 NOTE — Assessment & Plan Note (Signed)
Will add low dose hydralazine BID and have him start following home readings closely and logging them. F/u in 1 week virtual or in office for recheck to get form signed for eye procedure stating his BPs are improved and well controlled. Call if abnormal home readings or side effects in meantime

## 2019-10-01 NOTE — Progress Notes (Signed)
BP (!) 155/82 (BP Location: Left Arm, Cuff Size: Normal)   Pulse 84   SpO2 99%    Subjective:    Patient ID: Luke Pollen., male    DOB: Mar 07, 1961, 58 y.o.   MRN: [id number]  HPI: Luke Louque. is a 57 y.o. male  Chief Complaint  Patient presents with  . Hypertension   Pt presents today from [facility] appt for elevated BPs that were not well controlled this morning. BPs 173/104 and 163/104 at this appt. Does not check home BP readings but does have a monitor available for use. Medications were recently adjusted, came off HCTZ and currently taking 25 mg chlorthalidone the past week or two. Tolerating this well without issue. Denies CP, SOB, HAs, dizziness. Has cataract procedure now scheduled for 11/24 and has a clearance form to be filled out stating his BP regimen has been reviewed/adjusted and he is appropriate for procedure.   Relevant past medical, surgical, family and social history reviewed and updated as indicated. Interim medical history since our last visit reviewed. Allergies and medications reviewed and updated.  Review of Systems  Per HPI unless specifically indicated above     Objective:    BP (!) 155/82 (BP Location: Left Arm, Cuff Size: Normal)   Pulse 84   SpO2 99%   Wt Readings from Last 3 Encounters:  09/28/19 175 lb (79.4 kg)  08/08/19 174 lb 2.6 oz (79 kg)  07/06/19 170 lb (77.1 kg)    Physical Exam Vitals signs and nursing note reviewed.  Constitutional:      Appearance: Normal appearance.  HENT:     Head: Atraumatic.  Eyes:     Extraocular Movements: Extraocular movements intact.     Conjunctiva/sclera: Conjunctivae normal.  Neck:     Musculoskeletal: Normal range of motion and neck supple.  Cardiovascular:     Rate and Rhythm: Normal rate and regular rhythm.  Pulmonary:     Effort: Pulmonary effort is normal.     Breath sounds: Normal breath sounds.  Musculoskeletal: Normal range of motion.  Skin:    General: Skin is  warm and dry.  Neurological:     General: No focal deficit present.     Mental Status: He is oriented to person, place, and time.  Psychiatric:        Mood and Affect: Mood normal.        Thought Content: Thought content normal.        Judgment: Judgment normal.     Results for orders placed or performed in visit on 08/17/19  Comprehensive metabolic panel  Result Value Ref Range   Glucose 68 65 - 99 mg/dL   BUN 13 6 - 24 mg/dL   Creatinine, Ser 0.98 0.76 - 1.27 mg/dL   GFR calc non Af Amer 86 >59 mL/min/1.73   GFR calc Af Amer 99 >59 mL/min/1.73   BUN/Creatinine Ratio 13 9 - 20   Sodium 139 134 - 144 mmol/L   Potassium 4.6 3.5 - 5.2 mmol/L   Chloride 100 96 - 106 mmol/L   CO2 24 20 - 29 mmol/L   Calcium 9.2 8.7 - 10.2 mg/dL   Total Protein 6.9 6.0 - 8.5 g/dL   Albumin 4.1 3.8 - 4.9 g/dL   Globulin, Total 2.8 1.5 - 4.5 g/dL   Albumin/Globulin Ratio 1.5 1.2 - 2.2   Bilirubin Total 0.5 0.0 - 1.2 mg/dL   Alkaline Phosphatase 77 39 - 117 IU/L   AST  25 0 - 40 IU/L   ALT 29 0 - 44 IU/L  CBC with Differential/Platelet  Result Value Ref Range   WBC 14.6 (H) 3.4 - 10.8 x10E3/uL   RBC 4.73 4.14 - 5.80 x10E6/uL   Hemoglobin 14.9 13.0 - 17.7 g/dL   Hematocrit 43.1 37.5 - 51.0 %   MCV 91 79 - 97 fL   MCH 31.5 26.6 - 33.0 pg   MCHC 34.6 31.5 - 35.7 g/dL   RDW 12.8 11.6 - 15.4 %   Platelets 405 150 - 450 x10E3/uL   Neutrophils 68 Not Estab. %   Lymphs 21 Not Estab. %   Monocytes 8 Not Estab. %   Eos 1 Not Estab. %   Basos 1 Not Estab. %   Neutrophils Absolute 9.9 (H) 1.4 - 7.0 x10E3/uL   Lymphocytes Absolute 3.1 0.7 - 3.1 x10E3/uL   Monocytes Absolute 1.2 (H) 0.1 - 0.9 x10E3/uL   EOS (ABSOLUTE) 0.2 0.0 - 0.4 x10E3/uL   Basophils Absolute 0.1 0.0 - 0.2 x10E3/uL   Immature Granulocytes 1 Not Estab. %   Immature Grans (Abs) 0.1 0.0 - 0.1 x10E3/uL      Assessment & Plan:   Problem List Items Addressed This Visit      Cardiovascular and Mediastinum   Essential hypertension -  Primary    Will add low dose hydralazine BID and have him start following home readings closely and logging them. F/u in 1 week virtual or in office for recheck to get form signed for eye procedure stating his BPs are improved and well controlled. Call if abnormal home readings or side effects in meantime      Relevant Medications   hydrALAZINE (APRESOLINE) 10 MG tablet       Follow up plan: Return in about 1 week (around 10/08/2019) for BP f/u, can be virtual.

## 2019-10-06 ENCOUNTER — Other Ambulatory Visit: Payer: Self-pay | Admitting: Nurse Practitioner

## 2019-10-06 ENCOUNTER — Other Ambulatory Visit: Payer: Self-pay

## 2019-10-06 ENCOUNTER — Telehealth: Payer: Self-pay

## 2019-10-06 ENCOUNTER — Encounter: Payer: Self-pay | Admitting: *Deleted

## 2019-10-06 ENCOUNTER — Encounter: Payer: Self-pay | Admitting: Anesthesiology

## 2019-10-07 ENCOUNTER — Emergency Department: Payer: Medicaid Other

## 2019-10-07 ENCOUNTER — Ambulatory Visit (INDEPENDENT_AMBULATORY_CARE_PROVIDER_SITE_OTHER): Payer: Medicaid Other | Admitting: Nurse Practitioner

## 2019-10-07 ENCOUNTER — Telehealth: Payer: Self-pay | Admitting: Emergency Medicine

## 2019-10-07 ENCOUNTER — Encounter: Payer: Self-pay | Admitting: Nurse Practitioner

## 2019-10-07 ENCOUNTER — Other Ambulatory Visit: Payer: Self-pay

## 2019-10-07 ENCOUNTER — Telehealth: Payer: Self-pay | Admitting: Nurse Practitioner

## 2019-10-07 ENCOUNTER — Encounter: Payer: Self-pay | Admitting: *Deleted

## 2019-10-07 ENCOUNTER — Emergency Department
Admission: EM | Admit: 2019-10-07 | Discharge: 2019-10-07 | Discharge status: Against Medical Advice | Payer: Medicaid Other | Source: Home / Self Care

## 2019-10-07 VITALS — BP 153/101 | HR 88

## 2019-10-07 DIAGNOSIS — F1020 Alcohol dependence, uncomplicated: Secondary | ICD-10-CM | POA: Diagnosis present

## 2019-10-07 DIAGNOSIS — J449 Chronic obstructive pulmonary disease, unspecified: Secondary | ICD-10-CM | POA: Diagnosis present

## 2019-10-07 DIAGNOSIS — Z888 Allergy status to other drugs, medicaments and biological substances status: Secondary | ICD-10-CM

## 2019-10-07 DIAGNOSIS — D72829 Elevated white blood cell count, unspecified: Secondary | ICD-10-CM | POA: Diagnosis present

## 2019-10-07 DIAGNOSIS — Z79899 Other long term (current) drug therapy: Secondary | ICD-10-CM

## 2019-10-07 DIAGNOSIS — I10 Essential (primary) hypertension: Secondary | ICD-10-CM | POA: Diagnosis not present

## 2019-10-07 DIAGNOSIS — E860 Dehydration: Secondary | ICD-10-CM | POA: Diagnosis present

## 2019-10-07 DIAGNOSIS — N400 Enlarged prostate without lower urinary tract symptoms: Secondary | ICD-10-CM | POA: Diagnosis present

## 2019-10-07 DIAGNOSIS — Z8249 Family history of ischemic heart disease and other diseases of the circulatory system: Secondary | ICD-10-CM

## 2019-10-07 DIAGNOSIS — R112 Nausea with vomiting, unspecified: Secondary | ICD-10-CM | POA: Diagnosis present

## 2019-10-07 DIAGNOSIS — E876 Hypokalemia: Secondary | ICD-10-CM | POA: Diagnosis present

## 2019-10-07 DIAGNOSIS — G894 Chronic pain syndrome: Secondary | ICD-10-CM

## 2019-10-07 DIAGNOSIS — Z20828 Contact with and (suspected) exposure to other viral communicable diseases: Secondary | ICD-10-CM | POA: Diagnosis present

## 2019-10-07 DIAGNOSIS — F17210 Nicotine dependence, cigarettes, uncomplicated: Secondary | ICD-10-CM | POA: Diagnosis present

## 2019-10-07 DIAGNOSIS — Z825 Family history of asthma and other chronic lower respiratory diseases: Secondary | ICD-10-CM

## 2019-10-07 DIAGNOSIS — F17219 Nicotine dependence, cigarettes, with unspecified nicotine-induced disorders: Secondary | ICD-10-CM | POA: Diagnosis not present

## 2019-10-07 DIAGNOSIS — E871 Hypo-osmolality and hyponatremia: Principal | ICD-10-CM | POA: Diagnosis present

## 2019-10-07 DIAGNOSIS — Z880 Allergy status to penicillin: Secondary | ICD-10-CM

## 2019-10-07 DIAGNOSIS — K219 Gastro-esophageal reflux disease without esophagitis: Secondary | ICD-10-CM | POA: Diagnosis present

## 2019-10-07 DIAGNOSIS — F209 Schizophrenia, unspecified: Secondary | ICD-10-CM | POA: Diagnosis present

## 2019-10-07 LAB — BASIC METABOLIC PANEL
Anion gap: 16 — ABNORMAL HIGH (ref 5–15)
BUN: 14 mg/dL (ref 6–20)
CO2: 27 mmol/L (ref 22–32)
Calcium: 8.8 mg/dL — ABNORMAL LOW (ref 8.9–10.3)
Chloride: 80 mmol/L — ABNORMAL LOW (ref 98–111)
Creatinine, Ser: 1.01 mg/dL (ref 0.61–1.24)
GFR calc Af Amer: >60 mL/min (ref >60)
GFR calc non Af Amer: >60 mL/min (ref >60)
Glucose, Bld: 97 mg/dL (ref 70–99)
Potassium: 2.7 mmol/L — CL (ref 3.5–5.1)
Sodium: 123 mmol/L — ABNORMAL LOW (ref 135–145)

## 2019-10-07 LAB — CBC
HCT: 43.7 % (ref 39.0–52.0)
HCT: 42.4 % (ref 39.0–52.0)
Hemoglobin: 16.1 g/dL (ref 13.0–17.0)
Hemoglobin: 15.9 g/dL (ref 13.0–17.0)
MCH: 30.8 pg (ref 26.0–34.0)
MCH: 31 pg (ref 26.0–34.0)
MCHC: 36.8 g/dL — ABNORMAL HIGH (ref 30.0–36.0)
MCHC: 37.5 g/dL — ABNORMAL HIGH (ref 30.0–36.0)
MCV: 83.6 fL (ref 80.0–100.0)
MCV: 82.7 fL (ref 80.0–100.0)
Platelets: 262 10*3/uL (ref 150–400)
Platelets: 298 10*3/uL (ref 150–400)
RBC: 5.23 MIL/uL (ref 4.22–5.81)
RBC: 5.13 MIL/uL (ref 4.22–5.81)
RDW: 11.8 % (ref 11.5–15.5)
RDW: 11.5 % (ref 11.5–15.5)
WBC: 12.9 10*3/uL — ABNORMAL HIGH (ref 4.0–10.5)
WBC: 11.6 10*3/uL — ABNORMAL HIGH (ref 4.0–10.5)
nRBC: 0 % (ref 0.0–0.2)
nRBC: 0 % (ref 0.0–0.2)

## 2019-10-07 LAB — COMPREHENSIVE METABOLIC PANEL
ALT: 24 U/L (ref 0–44)
AST: 38 U/L (ref 15–41)
Albumin: 3.9 g/dL (ref 3.5–5.0)
Alkaline Phosphatase: 75 U/L (ref 38–126)
Anion gap: 19 — ABNORMAL HIGH (ref 5–15)
BUN: 16 mg/dL (ref 6–20)
CO2: 23 mmol/L (ref 22–32)
Calcium: 8.9 mg/dL (ref 8.9–10.3)
Chloride: 81 mmol/L — ABNORMAL LOW (ref 98–111)
Creatinine, Ser: 0.79 mg/dL (ref 0.61–1.24)
GFR calc Af Amer: >60 mL/min (ref >60)
GFR calc non Af Amer: >60 mL/min (ref >60)
Glucose, Bld: 106 mg/dL — ABNORMAL HIGH (ref 70–99)
Potassium: 2.4 mmol/L — CL (ref 3.5–5.1)
Sodium: 123 mmol/L — ABNORMAL LOW (ref 135–145)
Total Bilirubin: 1.2 mg/dL (ref 0.3–1.2)
Total Protein: 7.6 g/dL (ref 6.5–8.1)

## 2019-10-07 LAB — TROPONIN I (HIGH SENSITIVITY)
Troponin I (High Sensitivity): 7 ng/L (ref <18)
Troponin I (High Sensitivity): 7 ng/L (ref <18)

## 2019-10-07 IMAGING — CR DG CHEST 2V
2 series · 2 of 2 positions shown · non-contrast
Comparison: Radiograph [DATE]. CT 9 days ago [DATE]

CLINICAL DATA: Shortness of breath.

EXAM:
CHEST - 2 VIEW

[chest pa]
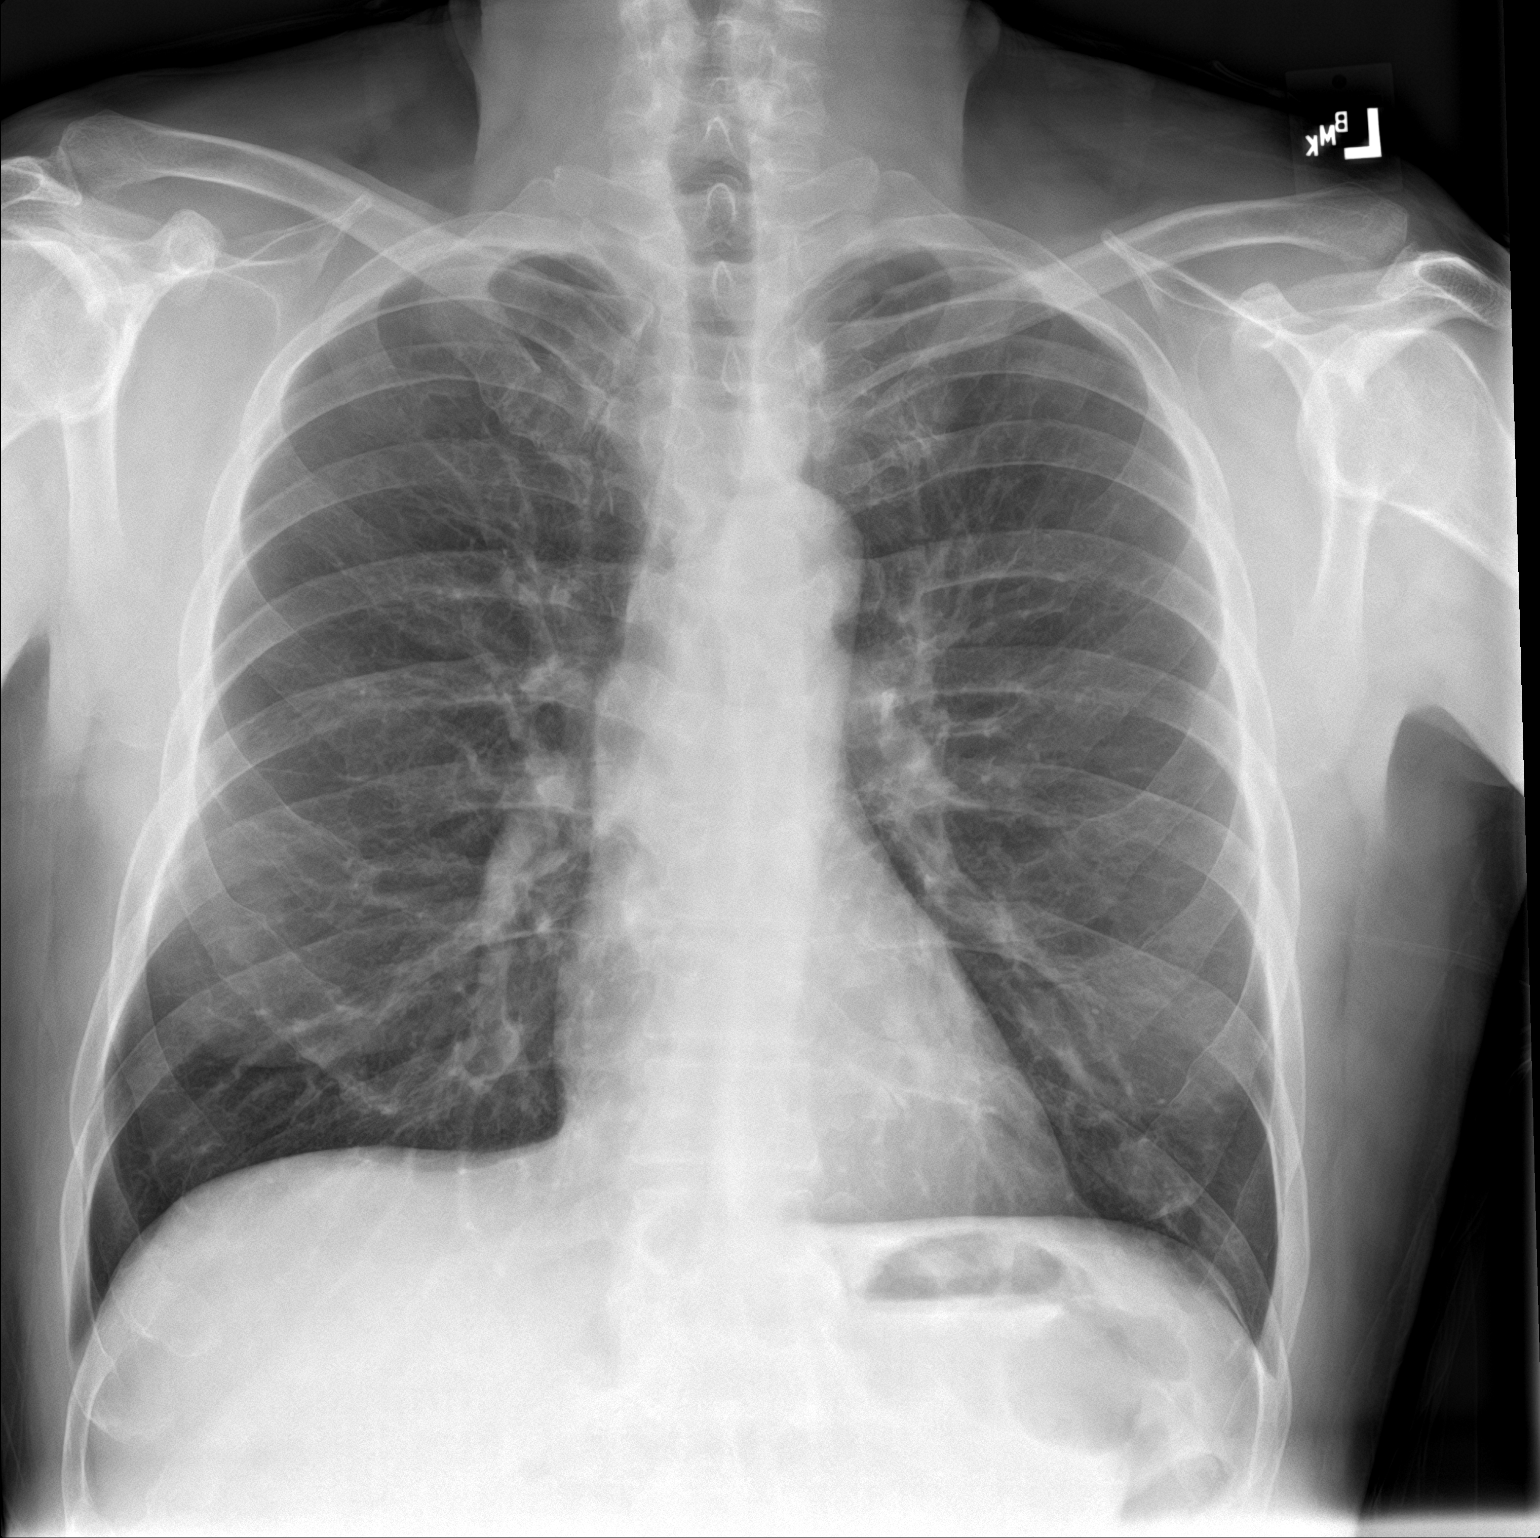

[chest lat]
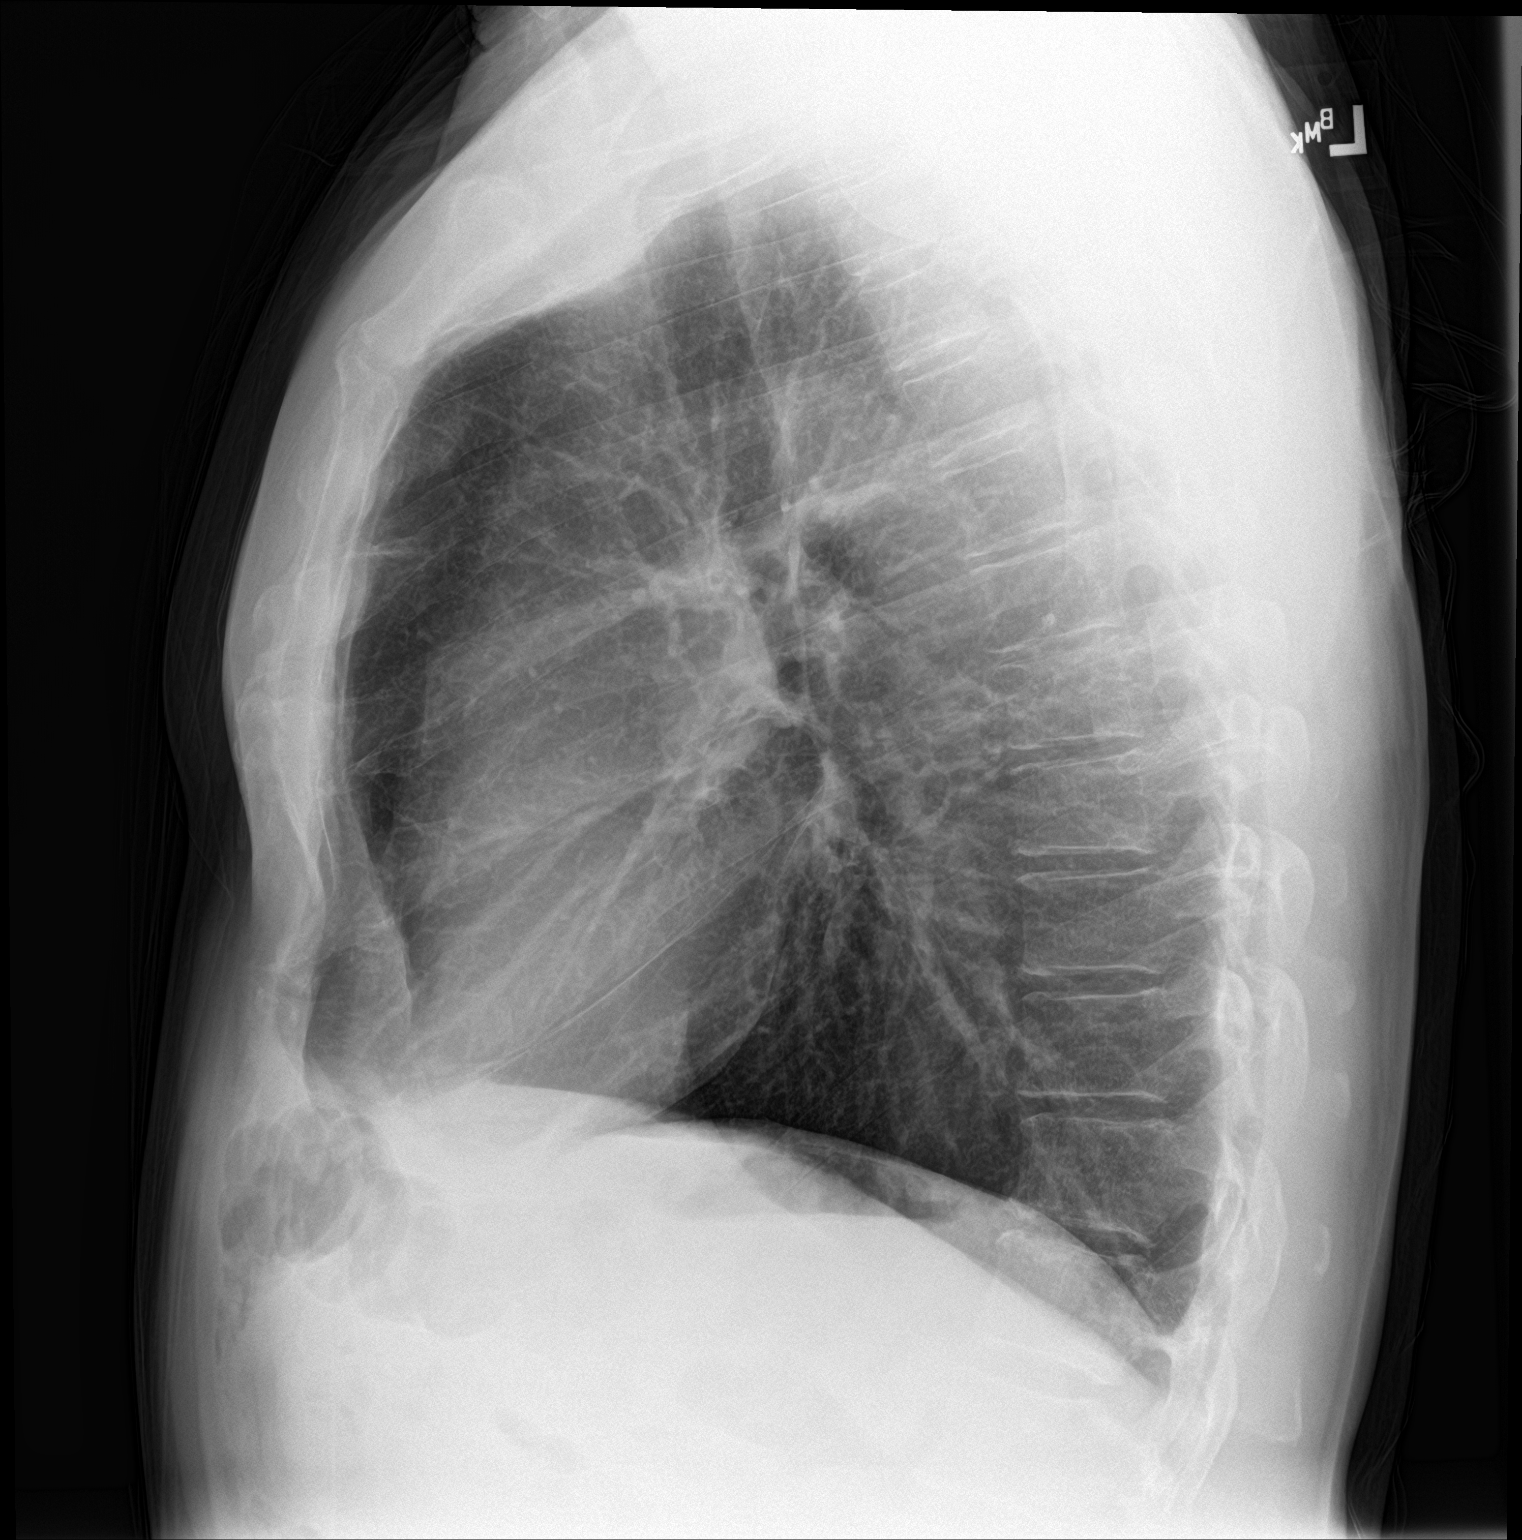

[2 of 2 positions shown; findings below may reference images not displayed]

FINDINGS: The cardiomediastinal contours are normal. Mild emphysema.
Hyperinflation. Pulmonary vasculature is normal. No consolidation,
pleural effusion, or pneumothorax. No acute osseous abnormalities
are seen.
IMPRESSION: 1. No acute abnormality.
2. Mild emphysema and hyperinflation.

## 2019-10-07 MED ORDER — HYDRALAZINE HCL 10 MG PO TABS: 10.0000 mg | ORAL_TABLET | Freq: Four times a day (QID) | ORAL | 2 refills | Status: DC

## 2019-10-07 MED ORDER — SODIUM CHLORIDE 0.9% FLUSH: 3.0000 mL | Freq: Once | INTRAVENOUS | Status: DC

## 2019-10-07 NOTE — ED Notes (Signed)
Patient symptoms and recent visit discussed with Dr. Cherylann Banas orders placed in computer as recommended by edp.

## 2019-10-07 NOTE — ED Triage Notes (Signed)
Pt was here earlier today for vomiting, htn, and shob but eloped prior to being seen. Pt was called back due to low potassium and sodium.

## 2019-10-07 NOTE — Telephone Encounter (Signed)
Spoke to patient via telephone and advised him to return to ER ASAP for further evaluation.  NA+ 123 and K+ 2.7 with patient complaining of nausea and vomiting.  He reports being scheduled for Covid testing tomorrow, have recommended he return to ER tonight and have this performed + obtain further work-up and IV replacement of electrolytes.  He stated "alright", reiterated to him importance of following these recommendations.

## 2019-10-07 NOTE — Telephone Encounter (Signed)
Lab calling to inform of critical lab value of Potassium 2.7, discussed results with EDP Siadecki .  Instructed to call the patient to return for further Eval   Called and spoke to patient via phone , informed him of lab values and stressed the importance of coming back for eval , and/ or call PCP   Pt verbalized understanding

## 2019-10-07 NOTE — Telephone Encounter (Signed)
Copied from [place] [id number]. Topic: General - Other >> Oct 07, 2019  1:49 PM Beverley Fiedler wrote: [facility] is requesting patients surgical clearance to be faxed to [phone]. Patients surgery is scheduled for 10/12/2019

## 2019-10-07 NOTE — Progress Notes (Signed)
BP (!) 153/101    Pulse 88    Subjective:    Patient ID: Luke Pollen., male    DOB: [birth year], 58 y.o.   MRN: [id number]  HPI: Luke Aftab. is a 58 y.o. male  Chief Complaint  Patient presents with   Hypertension   Pain      This visit was completed via telephone due to the restrictions of the COVID-19 pandemic. All issues as above were discussed and addressed but no physical exam was performed. If it was felt that the patient should be evaluated in the office, they were directed there. The patient verbally consented to this visit. Patient was unable to complete an audio/visual visit due to Lack of equipment. Due to the catastrophic nature of the COVID-19 pandemic, this visit was done through audio contact only.  Location of the patient: home  Location of the provider: home  Those involved with this call:   Provider: Marnee Guarneri, DNP  CMA: [name], [place] Desk/Registration: [name]   Time spent on call: 15 minutes on the phone discussing health concerns. 10 minutes total spent in review of patient's record and preparation of their chart.   I verified patient identity using two factors (patient name and date of birth). Patient consents verbally to being seen via telemedicine visit today.    CHRONIC PAIN: Currently being followed by Dr. Humphrey Rolls at pain clinic and patient reports they are about to trial new medications once Norco prescription completed.  On Norco for two years, currently takes this four times a day. On review of PMP his scripts for this go back to 08/08/2017 when he was prescribed Norco 10/325. Has arthritis in neck, knees, hands, lower back. States he has deterioration ofdiscsin neck.[facility] Christen [facility]ame.Urine drug screen negative on 04/28/2019, had been without pain meds for 3 weeks. He is very accepting of UDS and monitoring as did this with previous doctor,  last fill on PMP 07/21/2019.Reports pain at baseline is10/10 at worst and 7/10 at best to knees, neck, hands,neck. Has had injections in both knees, steroidwhich helped for 1-2 weeksbut then stopped. No history epidural injections, but is willing to try. Took Gabapentin at one time, but no longer takes as it made sick to stomach.He has been taking 5/325 Norco since starting with PCP and has tolerated this reduction without issue. He is aware PCP does not perform chronic pain management and once pain management involved he needs to adhere to their instructions.  HYPERTENSION Recently trialed Carvedilol, which caused nausea.  Amlodipine caused dizziness and no energy.  Lisinopril had swelling and anaphylaxis.  Started Chlorthalidone 25 MG daily on 09/14/2019 and then Hydralazine 10 MG BID started last week, has tolerated well without ADR. Continues to smoke 1/2 to 1 PPD, not interested in quitting.  Denies a high sodium diet at home or alcohol or illicit drug use. Hypertension status: uncontrolled  Satisfied with current treatment? yes Duration of hypertension: chronic BP monitoring frequency:  not checking BP range: 150-160/100 at home BP medication side effects:  no Medication compliance: good compliance Previous BP meds:amlodipine, chlorthalidone, HCTZ and lisinopril, carvedilol Aspirin: no Recurrent headaches: no Visual changes: no Palpitations: no Dyspnea: no Chest pain: no Lower extremity edema: no Dizzy/lightheaded: no   Relevant past medical, surgical, family and social history reviewed and updated as indicated. Interim medical history since our last visit reviewed. Allergies and medications reviewed and updated.  Review of Systems  Constitutional: Negative for activity change, diaphoresis, fatigue and fever.  Respiratory: Negative for cough, chest tightness, shortness of breath and wheezing.   Cardiovascular: Negative for chest pain, palpitations and leg swelling.    Gastrointestinal: Negative for abdominal distention, abdominal pain, constipation, diarrhea, nausea and vomiting.  Neurological: Negative for dizziness, syncope, weakness, light-headedness, numbness and headaches.  Psychiatric/Behavioral: Negative.     Per HPI unless specifically indicated above     Objective:    BP (!) 153/101    Pulse 88   Wt Readings from Last 3 Encounters:  09/28/19 175 lb (79.4 kg)  08/08/19 174 lb 2.6 oz (79 kg)  07/06/19 170 lb (77.1 kg)    Physical Exam   Unable to perform due to telephone only visit.  Results for orders placed or performed in visit on 08/17/19  Comprehensive metabolic panel  Result Value Ref Range   Glucose 68 65 - 99 mg/dL   BUN 13 6 - 24 mg/dL   Creatinine, Ser 0.98 0.76 - 1.27 mg/dL   GFR calc non Af Amer 86 >59 mL/min/1.73   GFR calc Af Amer 99 >59 mL/min/1.73   BUN/Creatinine Ratio 13 9 - 20   Sodium 139 134 - 144 mmol/L   Potassium 4.6 3.5 - 5.2 mmol/L   Chloride 100 96 - 106 mmol/L   CO2 24 20 - 29 mmol/L   Calcium 9.2 8.7 - 10.2 mg/dL   Total Protein 6.9 6.0 - 8.5 g/dL   Albumin 4.1 3.8 - 4.9 g/dL   Globulin, Total 2.8 1.5 - 4.5 g/dL   Albumin/Globulin Ratio 1.5 1.2 - 2.2   Bilirubin Total 0.5 0.0 - 1.2 mg/dL   Alkaline Phosphatase 77 39 - 117 IU/L   AST 25 0 - 40 IU/L   ALT 29 0 - 44 IU/L  CBC with Differential/Platelet  Result Value Ref Range   WBC 14.6 (H) 3.4 - 10.8 x10E3/uL   RBC 4.73 4.14 - 5.80 x10E6/uL   Hemoglobin 14.9 13.0 - 17.7 g/dL   Hematocrit 43.1 37.5 - 51.0 %   MCV 91 79 - 97 fL   MCH 31.5 26.6 - 33.0 pg   MCHC 34.6 31.5 - 35.7 g/dL   RDW 12.8 11.6 - 15.4 %   Platelets 405 150 - 450 x10E3/uL   Neutrophils 68 Not Estab. %   Lymphs 21 Not Estab. %   Monocytes 8 Not Estab. %   Eos 1 Not Estab. %   Basos 1 Not Estab. %   Neutrophils Absolute 9.9 (H) 1.4 - 7.0 x10E3/uL   Lymphocytes Absolute 3.1 0.7 - 3.1 x10E3/uL   Monocytes Absolute 1.2 (H) 0.1 - 0.9 x10E3/uL   EOS (ABSOLUTE) 0.2 0.0 - 0.4  x10E3/uL   Basophils Absolute 0.1 0.0 - 0.2 x10E3/uL   Immature Granulocytes 1 Not Estab. %   Immature Grans (Abs) 0.1 0.0 - 0.1 x10E3/uL      Assessment & Plan:   Problem List Items Addressed This Visit      Cardiovascular and Mediastinum   Uncontrolled hypertension - Primary    Chronic, uncontrolled.  Continue Chlorthalidone 25 MG and will increase Hydralazine to 10 MG QID, as is tolerating it without ADR.  Discussed at length with patient, will placed referral to cardiology for further work-up and guidance due to poorly controlled HTN with trials of multiple medications.  Recommend complete cessation of smoking, which he refuses.  Return in 4 weeks to office for labs and face to face visit, sooner if worsening.  Relevant Medications   hydrALAZINE (APRESOLINE) 10 MG tablet   Other Relevant Orders   Ambulatory referral to Cardiology     Nervous and Auditory   Nicotine dependence, cigarettes, w unsp disorders    I have recommended complete cessation of tobacco use. I have discussed various options available for assistance with tobacco cessation including over the counter methods (Nicotine gum, patch and lozenges). We also discussed prescription options (Chantix, Nicotine Inhaler / Nasal Spray). The patient is not interested in pursuing any prescription tobacco cessation options at this time.         Other   Chronic pain syndrome    Followed by Dr. Humphrey Rolls at pain clinic.  Continue this collaboration, patient aware PCP does not perform chronic pain management and will need to continue this pain clinic relationship for further refills or management.           Follow up plan: Return in about 4 weeks (around 11/04/2019) for HTN.

## 2019-10-07 NOTE — Assessment & Plan Note (Signed)
Followed by Dr. Humphrey Rolls at pain clinic.  Continue this collaboration, patient aware PCP does not perform chronic pain management and will need to continue this pain clinic relationship for further refills or management.

## 2019-10-07 NOTE — Patient Instructions (Signed)

## 2019-10-07 NOTE — Telephone Encounter (Signed)
Patient states he was seen at [facility] ED today and his potassium level was really low. He states he was advised to contact PCP to call in a medication for him.

## 2019-10-07 NOTE — ED Triage Notes (Signed)
Pt reports elevated blood pressure for weeks.  Pt on new blood pressure meds for 2 weeks.  Vomited x 1.  No abd pain.   No chest pain or sob.  pt alert  Speech clear.

## 2019-10-07 NOTE — Telephone Encounter (Signed)
[facility] notified of Luke Briggs's message.

## 2019-10-07 NOTE — Assessment & Plan Note (Signed)
I have recommended complete cessation of tobacco use. I have discussed various options available for assistance with tobacco cessation including over the counter methods (Nicotine gum, patch and lozenges). We also discussed prescription options (Chantix, Nicotine Inhaler / Nasal Spray). The patient is not interested in pursuing any prescription tobacco cessation options at this time.  

## 2019-10-07 NOTE — Assessment & Plan Note (Addendum)
Chronic, uncontrolled.  Continue Chlorthalidone 25 MG and will increase Hydralazine to 10 MG QID, as is tolerating it without ADR.  Discussed at length with patient, will placed referral to cardiology for further work-up and guidance due to poorly controlled HTN with trials of multiple medications.  Recommend complete cessation of smoking, which he refuses.  Return in 4 weeks to office for labs and face to face visit, sooner if worsening.

## 2019-10-08 ENCOUNTER — Telehealth: Payer: Self-pay | Admitting: Nurse Practitioner

## 2019-10-08 ENCOUNTER — Other Ambulatory Visit: Admission: RE | Admit: 2019-10-08 | Payer: Medicaid Other | Source: Ambulatory Visit

## 2019-10-08 ENCOUNTER — Encounter: Payer: Self-pay | Admitting: Nurse Practitioner

## 2019-10-08 ENCOUNTER — Ambulatory Visit: Payer: Self-pay | Admitting: Licensed Clinical Social Worker

## 2019-10-08 ENCOUNTER — Inpatient Hospital Stay
Admission: EM | Admit: 2019-10-08 | Discharge: 2019-10-08 | DRG: 641 | Discharge status: Against Medical Advice | Payer: Medicaid Other | Attending: Internal Medicine | Admitting: Internal Medicine

## 2019-10-08 DIAGNOSIS — F209 Schizophrenia, unspecified: Secondary | ICD-10-CM | POA: Diagnosis present

## 2019-10-08 DIAGNOSIS — K219 Gastro-esophageal reflux disease without esophagitis: Secondary | ICD-10-CM | POA: Diagnosis present

## 2019-10-08 DIAGNOSIS — D72829 Elevated white blood cell count, unspecified: Secondary | ICD-10-CM | POA: Diagnosis present

## 2019-10-08 DIAGNOSIS — F17210 Nicotine dependence, cigarettes, uncomplicated: Secondary | ICD-10-CM | POA: Diagnosis present

## 2019-10-08 DIAGNOSIS — Z8249 Family history of ischemic heart disease and other diseases of the circulatory system: Secondary | ICD-10-CM | POA: Diagnosis not present

## 2019-10-08 DIAGNOSIS — N400 Enlarged prostate without lower urinary tract symptoms: Secondary | ICD-10-CM | POA: Diagnosis not present

## 2019-10-08 DIAGNOSIS — Z79899 Other long term (current) drug therapy: Secondary | ICD-10-CM | POA: Diagnosis not present

## 2019-10-08 DIAGNOSIS — Z888 Allergy status to other drugs, medicaments and biological substances status: Secondary | ICD-10-CM | POA: Diagnosis not present

## 2019-10-08 DIAGNOSIS — J449 Chronic obstructive pulmonary disease, unspecified: Secondary | ICD-10-CM

## 2019-10-08 DIAGNOSIS — E871 Hypo-osmolality and hyponatremia: Secondary | ICD-10-CM | POA: Diagnosis present

## 2019-10-08 DIAGNOSIS — E876 Hypokalemia: Secondary | ICD-10-CM | POA: Diagnosis not present

## 2019-10-08 DIAGNOSIS — R112 Nausea with vomiting, unspecified: Secondary | ICD-10-CM | POA: Diagnosis not present

## 2019-10-08 DIAGNOSIS — F1020 Alcohol dependence, uncomplicated: Secondary | ICD-10-CM | POA: Diagnosis present

## 2019-10-08 DIAGNOSIS — Z20828 Contact with and (suspected) exposure to other viral communicable diseases: Secondary | ICD-10-CM | POA: Diagnosis present

## 2019-10-08 DIAGNOSIS — E860 Dehydration: Secondary | ICD-10-CM

## 2019-10-08 DIAGNOSIS — I10 Essential (primary) hypertension: Secondary | ICD-10-CM | POA: Diagnosis present

## 2019-10-08 DIAGNOSIS — Z825 Family history of asthma and other chronic lower respiratory diseases: Secondary | ICD-10-CM | POA: Diagnosis not present

## 2019-10-08 DIAGNOSIS — Z880 Allergy status to penicillin: Secondary | ICD-10-CM | POA: Diagnosis not present

## 2019-10-08 DIAGNOSIS — J438 Other emphysema: Secondary | ICD-10-CM | POA: Diagnosis present

## 2019-10-08 LAB — POC SARS CORONAVIRUS 2 AG: SARS Coronavirus 2 Ag: NEGATIVE

## 2019-10-08 LAB — TSH: TSH: 0.75 u[IU]/mL (ref 0.350–4.500)

## 2019-10-08 LAB — CBC
HCT: 40 % (ref 39.0–52.0)
Hemoglobin: 14.7 g/dL (ref 13.0–17.0)
MCH: 30.9 pg (ref 26.0–34.0)
MCHC: 36.8 g/dL — ABNORMAL HIGH (ref 30.0–36.0)
MCV: 84.2 fL (ref 80.0–100.0)
Platelets: 267 10*3/uL (ref 150–400)
RBC: 4.75 MIL/uL (ref 4.22–5.81)
RDW: 11.9 % (ref 11.5–15.5)
WBC: 6.5 10*3/uL (ref 4.0–10.5)
nRBC: 0 % (ref 0.0–0.2)

## 2019-10-08 LAB — COMPREHENSIVE METABOLIC PANEL
ALT: 20 U/L (ref 0–44)
AST: 28 U/L (ref 15–41)
Albumin: 3.4 g/dL — ABNORMAL LOW (ref 3.5–5.0)
Alkaline Phosphatase: 60 U/L (ref 38–126)
Anion gap: 11 (ref 5–15)
BUN: 14 mg/dL (ref 6–20)
CO2: 28 mmol/L (ref 22–32)
Calcium: 8 mg/dL — ABNORMAL LOW (ref 8.9–10.3)
Chloride: 92 mmol/L — ABNORMAL LOW (ref 98–111)
Creatinine, Ser: 0.69 mg/dL (ref 0.61–1.24)
GFR calc Af Amer: >60 mL/min (ref >60)
GFR calc non Af Amer: >60 mL/min (ref >60)
Glucose, Bld: 100 mg/dL — ABNORMAL HIGH (ref 70–99)
Potassium: 2.8 mmol/L — ABNORMAL LOW (ref 3.5–5.1)
Sodium: 131 mmol/L — ABNORMAL LOW (ref 135–145)
Total Bilirubin: 1.5 mg/dL — ABNORMAL HIGH (ref 0.3–1.2)
Total Protein: 6.6 g/dL (ref 6.5–8.1)

## 2019-10-08 LAB — OSMOLALITY: Osmolality: 261 mOsm/kg — ABNORMAL LOW (ref 275–295)

## 2019-10-08 LAB — MAGNESIUM: Magnesium: 1.8 mg/dL (ref 1.7–2.4)

## 2019-10-08 LAB — LIPASE, BLOOD: Lipase: 32 U/L (ref 11–51)

## 2019-10-08 MED ORDER — HYDROCORTISONE (PERIANAL) 2.5 % EX CREA
1.0000 "application " | TOPICAL_CREAM | Freq: Two times a day (BID) | CUTANEOUS | Status: DC
  Filled 2019-10-08: qty 28.35

## 2019-10-08 MED ORDER — ENOXAPARIN SODIUM 40 MG/0.4ML ~~LOC~~ SOLN
40.0000 mg | SUBCUTANEOUS | Status: DC
  Administered 2019-10-08: 40 mg via SUBCUTANEOUS
  Filled 2019-10-08: qty 0.4

## 2019-10-08 MED ORDER — POTASSIUM CHLORIDE IN NACL 40-0.9 MEQ/L-% IV SOLN
INTRAVENOUS | Status: DC
  Filled 2019-10-08 (×5): qty 1000

## 2019-10-08 MED ORDER — TAMSULOSIN HCL 0.4 MG PO CAPS
0.4000 mg | ORAL_CAPSULE | Freq: Every day | ORAL | Status: DC
  Administered 2019-10-08: 0.4 mg via ORAL
  Filled 2019-10-08: qty 1

## 2019-10-08 MED ORDER — ACETAMINOPHEN 325 MG PO TABS: 650.0000 mg | ORAL_TABLET | Freq: Four times a day (QID) | ORAL | Status: DC | PRN

## 2019-10-08 MED ORDER — VITAMIN B-1 100 MG PO TABS
100.0000 mg | ORAL_TABLET | Freq: Every day | ORAL | Status: DC
  Administered 2019-10-08: 100 mg via ORAL
  Filled 2019-10-08: qty 1

## 2019-10-08 MED ORDER — THIAMINE HCL 100 MG/ML IJ SOLN
Freq: Once | INTRAVENOUS | Status: AC
  Administered 2019-10-08: 06:00:00 via INTRAVENOUS
  Filled 2019-10-08: qty 1000

## 2019-10-08 MED ORDER — TRAZODONE HCL 50 MG PO TABS
25.0000 mg | ORAL_TABLET | Freq: Every evening | ORAL | Status: DC | PRN
  Filled 2019-10-08: qty 0.5

## 2019-10-08 MED ORDER — LORAZEPAM 2 MG/ML IJ SOLN: 0.0000 mg | Freq: Two times a day (BID) | INTRAMUSCULAR | Status: DC

## 2019-10-08 MED ORDER — HYDRALAZINE HCL 10 MG PO TABS
10.0000 mg | ORAL_TABLET | Freq: Four times a day (QID) | ORAL | Status: DC
  Administered 2019-10-08: 10 mg via ORAL
  Filled 2019-10-08 (×4): qty 1

## 2019-10-08 MED ORDER — POTASSIUM CHLORIDE CRYS ER 20 MEQ PO TBCR
40.0000 meq | EXTENDED_RELEASE_TABLET | ORAL | Status: DC
  Administered 2019-10-08: 40 meq via ORAL
  Filled 2019-10-08: qty 2

## 2019-10-08 MED ORDER — ONDANSETRON HCL 4 MG/2ML IJ SOLN: 4.0000 mg | Freq: Four times a day (QID) | INTRAMUSCULAR | Status: DC | PRN

## 2019-10-08 MED ORDER — THIAMINE HCL 100 MG/ML IJ SOLN
100.0000 mg | Freq: Every day | INTRAMUSCULAR | Status: DC
  Filled 2019-10-08: qty 1

## 2019-10-08 MED ORDER — MONTELUKAST SODIUM 10 MG PO TABS
10.0000 mg | ORAL_TABLET | Freq: Every day | ORAL | Status: DC
  Filled 2019-10-08: qty 1

## 2019-10-08 MED ORDER — HYDROCODONE-ACETAMINOPHEN 5-325 MG PO TABS: 1.0000 | ORAL_TABLET | Freq: Four times a day (QID) | ORAL | Status: DC | PRN

## 2019-10-08 MED ORDER — MAGNESIUM HYDROXIDE 400 MG/5ML PO SUSP: 30.0000 mL | Freq: Every day | ORAL | Status: DC | PRN

## 2019-10-08 MED ORDER — PANTOPRAZOLE SODIUM 40 MG PO TBEC
40.0000 mg | DELAYED_RELEASE_TABLET | Freq: Every day | ORAL | Status: DC
  Administered 2019-10-08: 40 mg via ORAL
  Filled 2019-10-08: qty 1

## 2019-10-08 MED ORDER — POTASSIUM CHLORIDE 10 MEQ/100ML IV SOLN
10.0000 meq | INTRAVENOUS | Status: AC
  Administered 2019-10-08 (×2): 10 meq via INTRAVENOUS
  Filled 2019-10-08 (×4): qty 100

## 2019-10-08 MED ORDER — ONDANSETRON HCL 4 MG PO TABS
4.0000 mg | ORAL_TABLET | Freq: Four times a day (QID) | ORAL | Status: DC | PRN
  Filled 2019-10-08: qty 1

## 2019-10-08 MED ORDER — LORAZEPAM 2 MG PO TABS: 0.0000 mg | ORAL_TABLET | Freq: Two times a day (BID) | ORAL | Status: DC

## 2019-10-08 MED ORDER — ACETAMINOPHEN 650 MG RE SUPP: 650.0000 mg | Freq: Four times a day (QID) | RECTAL | Status: DC | PRN

## 2019-10-08 MED ORDER — POTASSIUM CHLORIDE 20 MEQ PO PACK
40.0000 meq | PACK | Freq: Once | ORAL | Status: AC
  Administered 2019-10-08: 40 meq via ORAL
  Filled 2019-10-08: qty 2

## 2019-10-08 MED ORDER — SODIUM CHLORIDE 0.9 % IV BOLUS
1000.0000 mL | Freq: Once | INTRAVENOUS | Status: AC
  Administered 2019-10-08: 1000 mL via INTRAVENOUS

## 2019-10-08 MED ORDER — LORAZEPAM 2 MG PO TABS: 0.0000 mg | ORAL_TABLET | Freq: Four times a day (QID) | ORAL | Status: DC

## 2019-10-08 MED ORDER — LORAZEPAM 2 MG/ML IJ SOLN
0.0000 mg | Freq: Four times a day (QID) | INTRAMUSCULAR | Status: DC
  Administered 2019-10-08: 2 mg via INTRAVENOUS
  Filled 2019-10-08: qty 1

## 2019-10-08 MED ORDER — POTASSIUM CHLORIDE 20 MEQ/15ML (10%) PO SOLN
40.0000 meq | Freq: Once | ORAL | Status: AC
  Administered 2019-10-08: 40 meq via ORAL
  Filled 2019-10-08: qty 30

## 2019-10-08 MED ORDER — ADULT MULTIVITAMIN W/MINERALS CH
1.0000 | ORAL_TABLET | Freq: Every day | ORAL | Status: DC
  Administered 2019-10-08: 1 via ORAL
  Filled 2019-10-08: qty 1

## 2019-10-08 MED ORDER — MAGNESIUM SULFATE 2 GM/50ML IV SOLN
2.0000 g | Freq: Once | INTRAVENOUS | Status: DC
  Filled 2019-10-08: qty 50

## 2019-10-08 MED ORDER — FINASTERIDE 5 MG PO TABS
5.0000 mg | ORAL_TABLET | Freq: Every day | ORAL | Status: DC
  Administered 2019-10-08: 5 mg via ORAL
  Filled 2019-10-08: qty 1

## 2019-10-08 NOTE — ED Notes (Signed)
Dr Blaine Hamper aware of pt leaving AMA

## 2019-10-08 NOTE — ED Notes (Signed)
Dietary notified to sent correct breakfast tray.

## 2019-10-08 NOTE — Telephone Encounter (Signed)
Called and scheduled patient an appointment for Monday

## 2019-10-08 NOTE — Discharge Summary (Addendum)
Physician Discharge Summary  Luke Briggs. [id number] DOB: 1960-11-22 DOA: 10/08/2019  PCP: [name], NP  Admit date: 10/08/2019 Discharge date: 10/08/2019  Recommendations for Outpatient Follow-up:  None. Pt left hospital on [place]: none Equipment/Devices:  none   Discharge Condition: improved CODE STATUS: full Diet recommendation: heart  Health diet  Brief/Interim Summary (HPI)  Luke Briggs  is a 58 y.o. Caucasian male with a known history of COPD, hypertension, schizophrenia GERD, presented to the emergency room with acute onset of intractable vomiting which apparently has been worsening lately.  He stated that he vomits about 2-3 times a day and has been doing so for the last year!.  He denied any diarrhea or melena or bright red being per rectum or abdominal pain.  No fever or chills.  No headache or dizziness or blurred vision.  No dysuria, oliguria or hematuria or flank pain.  No cough or wheezing or hemoptysis.  No recent Covid exposure.  He denied any loss of taste or smell.  Upon presenting to the emergency room, blood pressure was elevated 153/101 and later 170/98 and later has improved.  Vital signs otherwise were within normal.  Labs revealed hypokalemia hyponatremia with serum lipase of 32 and mild leukocytosis.  Two-view chest x-ray showed no acute cardiopulmonary disease.  EKG showed sinus rhythm with a rate of 81 with poor R wave progression.  The patient was given potassium replacement and hydration with IV normal saline.  He will be admitted to a medical monitored bed for further evaluation and management.  Subjective  When I saw patient in the morning, patient reports that his nausea, vomiting and abdominal pain have almost resolved.  He has some mild shortness of breath and  cough, no chest pain.  No symptoms of UTI or unilateral weakness.  Discharge Diagnoses and Hospital Course:   Principal Problem:   Hyponatremia Active  Problems:   BPH (benign prostatic hyperplasia)   GERD (gastroesophageal reflux disease)   Alcohol dependence (HCC)   COPD (chronic obstructive pulmonary disease) (HCC)   Hypokalemia   Intractable nausea and vomiting  Hyponatremia: his Na was 123, improved to 131 from 15:13 on 11/19 to 131 at  5:42 on 11/20. Pt was given 2 L normal saline bolus, and continuation of 100 cc/h of  normal saline in hospital.  After I saw the patient in AM, I discontinued IV fluid.  Plan was to recheck BMP, unfortunately patient left hospital AMA.  Intractable nausea and vomiting with subsequent dehydration: All mostly resolved in the morning. -Pt was treated with IV normal saline.  Hypokalemia: K 2.7 -->2.4 --> 2.8.   -Patient was given total of 120 mEq of potassium chloride  Hypertension; - hydralazine and hold off chlorthalidone in hospital   COPD: - MDIs and Singulair.  Alcohol dependence: -Treated with the CIWA protocol   Discharge Instructions   Allergies as of 10/08/2019      Reactions   Lisinopril Swelling   Reaction: angioedema   Amlodipine    Dizziness and edema with this   Carvedilol Nausea And Vomiting   Penicillins Rash   Reaction: Feels like skin is on fire Did it involve swelling of the face/tongue/throat, SOB, or low BP? No Did it involve sudden or severe rash/hives, skin peeling, or any reaction on the inside of your mouth or nose? No Did you need to seek medical attention at a hospital or doctor's office? Yes When did it last happen?30 years ago If all  above answers are "NO", may proceed with cephalosporin use.      Medication List    ASK your doctor about these medications   albuterol 108 (90 Base) MCG/ACT inhaler Commonly known as: VENTOLIN HFA Inhale 2 puffs into the lungs every 6 (six) hours as needed for wheezing or shortness of breath.   chlorthalidone 25 MG tablet Commonly known as: HYGROTON Take 0.5 tablets (12.5 mg total) by mouth daily. If tolerating in  one week, then start taking whole pill (25 MG).   EPINEPHrine 0.3 mg/0.3 mL Soaj injection Commonly known as: EPI-PEN Inject 0.3 mLs (0.3 mg total) into the muscle as needed for anaphylaxis.   finasteride 5 MG tablet Commonly known as: PROSCAR Take 1 tablet (5 mg total) by mouth daily.   hydrALAZINE 10 MG tablet Commonly known as: APRESOLINE Take 1 tablet (10 mg total) by mouth 4 (four) times daily.   HYDROcodone-acetaminophen 5-325 MG tablet Commonly known as: NORCO/VICODIN Take 1 tablet by mouth every 6 (six) hours as needed for moderate pain.   hydrocortisone 2.5 % rectal cream Commonly known as: ANUSOL-HC Place 1 application rectally 2 (two) times daily.   Ipratropium-Albuterol 20-100 MCG/ACT Aers respimat Commonly known as: COMBIVENT Inhale 1 puff into the lungs every 6 (six) hours.   montelukast 10 MG tablet Commonly known as: SINGULAIR Take 10 mg by mouth at bedtime.   multivitamin with minerals Tabs tablet Take 1 tablet by mouth daily.   omeprazole 40 MG capsule Commonly known as: PRILOSEC Take 1 capsule (40 mg total) by mouth daily.   tamsulosin 0.4 MG Caps capsule Commonly known as: FLOMAX Take 1 capsule (0.4 mg total) by mouth daily.   Tiotropium Bromide-Olodaterol 2.5-2.5 MCG/ACT Aers Inhale 2 puffs into the lungs daily.   triamcinolone cream 0.1 % Commonly known as: KENALOG Apply 1 application topically 2 (two) times daily.       Allergies  Allergen Reactions  . Lisinopril Swelling    Reaction: angioedema  . Amlodipine     Dizziness and edema with this  . Carvedilol Nausea And Vomiting  . Penicillins Rash    Reaction: Feels like skin is on fire Did it involve swelling of the face/tongue/throat, SOB, or low BP? No Did it involve sudden or severe rash/hives, skin peeling, or any reaction on the inside of your mouth or nose? No Did you need to seek medical attention at a hospital or doctor's office? Yes When did it last happen?30 years ago If  all above answers are "NO", may proceed with cephalosporin use.     Consultations: none  Procedures/Studies: Dg Chest 2 View  Result Date: 10/07/2019 CLINICAL DATA:  Shortness of breath. EXAM: CHEST - 2 VIEW COMPARISON:  Radiograph 08/07/2019. CT 9 days ago 09/28/2019 FINDINGS: The cardiomediastinal contours are normal. Mild emphysema. Hyperinflation. Pulmonary vasculature is normal. No consolidation, pleural effusion, or pneumothorax. No acute osseous abnormalities are seen. IMPRESSION: 1. No acute abnormality. 2. Mild emphysema and hyperinflation. Electronically Signed   By: Keith Rake M.D.   On: 10/07/2019 22:15   Ct Chest Lung Cancer Screening Low Dose Wo Contrast  Result Date: 09/28/2019 CLINICAL DATA:  58 year old male current smoker, with 43 pack-year history of smoking, for initial lung cancer screening EXAM: CT CHEST WITHOUT CONTRAST LOW-DOSE FOR LUNG CANCER SCREENING TECHNIQUE: Multidetector CT imaging of the chest was performed following the standard protocol without IV contrast. COMPARISON:  None. FINDINGS: Cardiovascular: The heart is normal in size. No pericardial effusion. No evidence thoracic aortic aneurysm. Mild  atherosclerotic calcifications of the aortic arch. Mediastinum/Nodes: No suspicious mediastinal lymphadenopathy. Visualized thyroid is unremarkable. Lungs/Pleura: Mild biapical pleural-parenchymal scarring. Mild centrilobular and paraseptal emphysematous changes, upper lobe predominant. No focal consolidation. Two perifissural nodules along the right minor fissure measuring up to 6.6 mm, benign. Additional scattered small bilateral pulmonary nodules measuring up to 5.1 mm in the right upper lobe. No pleural effusion or pneumothorax. Upper Abdomen: Visualized upper abdomen is grossly unremarkable. Musculoskeletal: Mild degenerative changes of the visualized thoracolumbar spine. Old right posterior rib fracture deformities. IMPRESSION: Lung-RADS 2, benign appearance or  behavior. Continue annual screening with low-dose chest CT without contrast in 12 months. Aortic Atherosclerosis (ICD10-I70.0) and Emphysema (ICD10-J43.9). Electronically Signed   By: Julian Hy M.D.   On: 09/28/2019 12:35      Discharge Exam: Vitals:   10/08/19 0535 10/08/19 0549  BP: 120/80 138/89  Pulse: 70 65  Resp: 16 16  Temp:  97.8 F (36.6 C)  SpO2: 98% 96%   Vitals:   10/08/19 0146 10/08/19 0327 10/08/19 0535 10/08/19 0549  BP: (!) 160/96 122/79 120/80 138/89  Pulse: 75 71 70 65  Resp:  14 16 16   Temp:    97.8 F (36.6 C)  TempSrc:    Oral  SpO2:  98% 98% 96%  Weight:      Height:       PE was done in AM General: Pt is alert, awake, not in acute distress Cardiovascular: RRR, S1/S2 +, no rubs, no gallops Respiratory: CTA bilaterally, no wheezing, no rhonchi Abdominal: Soft, NT, ND, bowel sounds + Extremities: no edema, no cyanosis    The results of significant diagnostics from this hospitalization (including imaging, microbiology, ancillary and laboratory) are listed below for reference.     Microbiology: No results found for this or any previous visit (from the past 240 hour(s)).   Labs: BNP (last 3 results) Recent Labs    07/06/19 1805  BNP [id number]   Basic Metabolic Panel: Recent Labs  Lab 10/07/19 1513 10/07/19 2143 10/08/19 0542  NA 123* 123* 131*  K 2.7* 2.4* 2.8*  CL 80* 81* 92*  CO2 27 23 28   GLUCOSE 97 106* 100*  BUN 14 16 14   CREATININE 1.01 0.79 0.69  CALCIUM 8.8* 8.9 8.0*  MG 1.8  --   --    Liver Function Tests: Recent Labs  Lab 10/07/19 2143 10/08/19 0542  AST 38 28  ALT 24 20  ALKPHOS 75 60  BILITOT 1.2 1.5*  PROT 7.6 6.6  ALBUMIN 3.9 3.4*   Recent Labs  Lab 10/07/19 2143  LIPASE 32   No results for input(s): AMMONIA in the last 168 hours. CBC: Recent Labs  Lab 10/07/19 1513 10/07/19 2143 10/08/19 0542  WBC 12.9* 11.6* 6.5  HGB 16.1 15.9 14.7  HCT 43.7 42.4 40.0  MCV 83.6 82.7 84.2  PLT 262 298 267    Cardiac Enzymes: No results for input(s): CKTOTAL, CKMB, CKMBINDEX, TROPONINI in the last 168 hours. BNP: Invalid input(s): POCBNP CBG: No results for input(s): GLUCAP in the last 168 hours. D-Dimer No results for input(s): DDIMER in the last 72 hours. Hgb A1c No results for input(s): HGBA1C in the last 72 hours. Lipid Profile No results for input(s): CHOL, HDL, LDLCALC, TRIG, CHOLHDL, LDLDIRECT in the last 72 hours. Thyroid function studies Recent Labs    10/07/19 1513  TSH 0.750   Anemia work up No results for input(s): VITAMINB12, FOLATE, FERRITIN, TIBC, IRON, RETICCTPCT in the last 72 hours. Urinalysis  Component Value Date/Time   COLORURINE YELLOW (A) 08/08/2019 0223   APPEARANCEUR CLEAR (A) 08/08/2019 0223   LABSPEC 1.013 08/08/2019 0223   PHURINE 6.0 08/08/2019 0223   GLUCOSEU NEGATIVE 08/08/2019 0223   HGBUR NEGATIVE 08/08/2019 0223   BILIRUBINUR NEGATIVE 08/08/2019 0223   KETONESUR 5 (A) 08/08/2019 0223   PROTEINUR NEGATIVE 08/08/2019 0223   NITRITE NEGATIVE 08/08/2019 0223   LEUKOCYTESUR NEGATIVE 08/08/2019 0223   Sepsis Labs Invalid input(s): PROCALCITONIN,  WBC,  LACTICIDVEN Microbiology No results found for this or any previous visit (from the past 240 hour(s)).  Time coordinating discharge:  35 minutes.   SIGNED:  Ivor Costa, DO [facility] 10/08/2019, 7:06 PM Pager is on [place]  If 7PM-7AM, please contact night-coverage [email or web address] Password [email or web address]

## 2019-10-08 NOTE — ED Notes (Signed)
Pt removed himself from monitoring equipment and asked to leave- explained to pt that the doctor wanted to recheck labs and possibly discharge him later today- pt still requesting to leave- Iv removed and attempted to have pt sign- pt states "this is shit" and walked out refusing to sign AMA form- will notify MD

## 2019-10-08 NOTE — Chronic Care Management (AMB) (Signed)
  Care Management   Follow Up Note  10/08/2019 Name: Luke Briggs. MRN: [id number] DOB: 10-31-1961  Referred by: [name], NP Reason for referral : Care Coordination   [name]. is a 58 y.o. year old male who is a primary care patient of [name], Barbaraann Faster, NP. The care management team was consulted for assistance with care management and care coordination needs.    Review of patient status, including review of consultants reports, relevant laboratory and other test results, and collaboration with appropriate care team members and the patient's provider was performed as part of comprehensive patient evaluation and provision of chronic care management services.   LCSW received message from inpatient staff. "Pt removed himself from monitoring equipment and asked to leave- explained to pt that the doctor wanted to recheck labs and possibly discharge him later today- pt still requesting to leave- Iv removed and attempted to have pt sign- pt cursed at staff and walked out refusing to sign AMA form- will notify MD" LCSW will update PCP and CCM Team at this time. Patient will need to be seen in the office for hospital follow up appointment.  The care management team will reach out to the patient again over the next 7 days.   Eula Fried, BSW, MSW, [facility] [email or web address] [phone]

## 2019-10-08 NOTE — ED Provider Notes (Signed)
[facility] Emergency Department Provider Note    First MD Initiated Contact with Patient 10/08/19 0007     (approximate)  I have reviewed the triage vital signs and the nursing notes.   HISTORY  Chief Complaint Abnormal Lab    HPI Luke Gilkison. is a 58 y.o. male with below list of previous medical conditions include  alcohol abuse COPD schizophrenia returns to the emergency department secondary to 1 day history of vomiting and noted hypertension.  Patient to the emergency department earlier secondary to vomiting however left and now returns because he was notified that his sodium and potassium were low.  Patient admits to heavy daily EtOH ingestion.  Patient also admits to 1 pack/day tobacco use.        Past Medical History:  Diagnosis Date  . Angioedema 08/07/2019  . Arthritis   . COPD (chronic obstructive pulmonary disease) ([place])   . GERD (gastroesophageal reflux disease)   . Hemorrhoids   . Hypertension   . Prostate disorder   . PTSD (post-traumatic stress disorder)   . Schizophrenia ([place])    Per patient's report.    Patient Active Problem List   Diagnosis Date Noted  . Aortic atherosclerosis ([place]) 09/29/2019  . Vision changes 09/14/2019  . Bilateral primary osteoarthritis of knee 07/01/2019  . Primary osteoarthritis of right knee 07/01/2019  . Primary osteoarthritis of left knee 07/01/2019  . COPD (chronic obstructive pulmonary disease) ([place]) 06/17/2019  . Controlled substance agreement signed 05/05/2019  . Chronic pain syndrome 04/28/2019  . BPH (benign prostatic hyperplasia) 04/28/2019  . Eczema 04/28/2019  . Uncontrolled hypertension 04/28/2019  . GERD (gastroesophageal reflux disease) 04/28/2019  . Schizophrenia ([place]) 04/28/2019  . Chronic post-traumatic stress disorder (PTSD) 04/28/2019  . Hemorrhoids 04/28/2019  . Nicotine dependence, cigarettes, w unsp disorders 04/28/2019  . History of alcohol dependence ([place])  04/28/2019    Past Surgical History:  Procedure Laterality Date  . EYE SURGERY Left     Prior to Admission medications   Medication Sig Start Date End Date Taking? Authorizing Provider  albuterol (VENTOLIN HFA) 108 (90 Base) MCG/ACT inhaler Inhale 2 puffs into the lungs every 6 (six) hours as needed for wheezing or shortness of breath. 05/31/19  Yes Duffy Bruce, MD  chlorthalidone (HYGROTON) 25 MG tablet Take 0.5 tablets (12.5 mg total) by mouth daily. If tolerating in one week, then start taking whole pill (25 MG). 09/14/19  Yes [name], [name] T, NP  finasteride (PROSCAR) 5 MG tablet Take 1 tablet (5 mg total) by mouth daily. 04/28/19  Yes [name], [name] T, NP  hydrALAZINE (APRESOLINE) 10 MG tablet Take 1 tablet (10 mg total) by mouth 4 (four) times daily. 10/07/19  Yes [name], [name] T, NP  HYDROcodone-acetaminophen (NORCO/VICODIN) 5-325 MG tablet Take 1 tablet by mouth every 6 (six) hours as needed for moderate pain. 09/20/19 10/20/19 Yes [name], [name] T, NP  hydrocortisone (ANUSOL-HC) 2.5 % rectal cream Place 1 application rectally 2 (two) times daily. 07/29/19  Yes [name], [name] T, NP  Ipratropium-Albuterol (COMBIVENT) 20-100 MCG/ACT AERS respimat Inhale 1 puff into the lungs every 6 (six) hours. 06/15/19  Yes Letitia Neri L, PA-C  montelukast (SINGULAIR) 10 MG tablet Take 10 mg by mouth at bedtime.   Yes [provider]  Multiple Vitamin (MULTIVITAMIN WITH MINERALS) TABS tablet Take 1 tablet by mouth daily. 08/09/19  Yes Gouru, Illene Silver, MD  omeprazole (PRILOSEC) 40 MG capsule Take 1 capsule (40 mg total) by mouth daily. 04/28/19  Yes [name], [name] T, NP  tamsulosin (FLOMAX) 0.4 MG CAPS capsule Take 1 capsule (0.4 mg total) by mouth daily. 04/28/19  Yes [name], [name], NP  Tiotropium Bromide-Olodaterol 2.5-2.5 MCG/ACT AERS Inhale 2 puffs into the lungs daily. 08/17/19  Yes [name], [name] T, NP  triamcinolone cream (KENALOG) 0.1 % Apply 1 application topically 2 (two) times  daily. 10/06/19  Yes [name], [name] T, NP  EPINEPHrine 0.3 mg/0.3 mL IJ SOAJ injection Inject 0.3 mLs (0.3 mg total) into the muscle as needed for anaphylaxis. 08/09/19   Nicholes Mango, MD    Allergies Lisinopril, Carvedilol, and Penicillins  Family History  Problem Relation Age of Onset  . Heart disease Mother   . Osteoporosis Mother   . Heart disease Father   . Emphysema Father   . Heart disease Sister   . Emphysema Maternal Grandmother   . Heart disease Maternal Grandfather   . Osteoporosis Sister     Social History Social History   Tobacco Use  . Smoking status: Current Every Day Smoker    Packs/day: 1.00    Years: 43.00    Pack years: 43.00    Types: Cigarettes  . Smokeless tobacco: Never Used  . Tobacco comment: .75 ppd now  Substance Use Topics  . Alcohol use: Yes    Comment: beer on occasion  . Drug use: Never    Review of Systems Constitutional: No fever/chills Eyes: No visual changes. ENT: No sore throat. Cardiovascular: Denies chest pain. Respiratory: Denies shortness of breath. Gastrointestinal: No abdominal pain.  No nausea, no vomiting.  No diarrhea.  No constipation. Genitourinary: Negative for dysuria. Musculoskeletal: Negative for neck pain.  Negative for back pain. Integumentary: Negative for rash. Neurological: Negative for headaches, focal weakness or numbness. Psychiatric:  Heavy daily EtOH ingestion history of seizures in the past   ____________________________________________   PHYSICAL EXAM:  VITAL SIGNS: ED Triage Vitals  Enc Vitals Group     BP 10/07/19 2133 (!) 164/84     Pulse Rate 10/07/19 2133 92     Resp 10/07/19 2133 20     Temp 10/07/19 2133 98.5 F (36.9 C)     Temp Source 10/07/19 2133 Oral     SpO2 10/07/19 2133 96 %     Weight 10/07/19 2134 81.6 kg (180 lb)     Height 10/07/19 2134 1.778 m (5\' 10" )     Head Circumference --      Peak Flow --      Pain Score 10/07/19 2133 5     Pain Loc --      Pain Edu? --       Excl. in [place]? --     Constitutional: Alert and oriented.  Eyes: Conjunctivae are normal.  Head: Atraumatic. Mouth/Throat: Patient is wearing a mask. Neck: No stridor.  No meningeal signs.   Cardiovascular: Normal rate, regular rhythm. Good peripheral circulation. Grossly normal heart sounds. Respiratory: Normal respiratory effort.  No retractions. Gastrointestinal: Epigastric pain. No distention.  Musculoskeletal: No lower extremity tenderness nor edema. No gross deformities of extremities. Neurologic:  Normal speech and language. No gross focal neurologic deficits are appreciated.  Skin:  Skin is warm, dry and intact. Psychiatric: Mood and affect are normal. Speech and behavior are normal.  ____________________________________________   LABS (all labs ordered are listed, but only abnormal results are displayed)  Labs Reviewed  CBC - Abnormal; Notable for the following components:      Result Value   WBC 11.6 (*)  MCHC 37.5 (*)    All other components within normal limits  COMPREHENSIVE METABOLIC PANEL - Abnormal; Notable for the following components:   Sodium 123 (*)    Potassium 2.4 (*)    Chloride 81 (*)    Glucose, Bld 106 (*)    Anion gap 19 (*)    All other components within normal limits  LIPASE, BLOOD   ____________________________________________  EKG  ED ECG REPORT I, Doran N BROWN, the attending physician, personally viewed and interpreted this ECG.   Date: 10/08/2019  EKG Time: 3:13 PM  Rate: 81  Rhythm: Normal sinus rhythm  Axis: Normal  Intervals: Normal  ST&T Change: None  ____________________________________________  RADIOLOGY I, [place] N BROWN, personally viewed and evaluated these images (plain radiographs) as part of my medical decision making, as well as reviewing the written report by the radiologist.  ED MD interpretation: No acute abnormality noted on chest x-ray mild emphysema and hyperinflation per radiologist  Official  radiology report(s): Dg Chest 2 View  Result Date: 10/07/2019 CLINICAL DATA:  Shortness of breath. EXAM: CHEST - 2 VIEW COMPARISON:  Radiograph 08/07/2019. CT 9 days ago 09/28/2019 FINDINGS: The cardiomediastinal contours are normal. Mild emphysema. Hyperinflation. Pulmonary vasculature is normal. No consolidation, pleural effusion, or pneumothorax. No acute osseous abnormalities are seen. IMPRESSION: 1. No acute abnormality. 2. Mild emphysema and hyperinflation. Electronically Signed   By: Keith Rake M.D.   On: 10/07/2019 22:15    ____________________________________________   PROCEDURES    .Critical Care Performed by: Gregor Hams, MD Authorized by: Gregor Hams, MD   Critical care provider statement:    Critical care time (minutes):  30   Critical care time was exclusive of:  Separately billable procedures and treating other patients   Critical care was necessary to treat or prevent imminent or life-threatening deterioration of the following conditions:  Metabolic crisis   Critical care was time spent personally by me on the following activities:  Development of treatment plan with patient or surrogate, discussions with consultants, evaluation of patient's response to treatment, examination of patient, obtaining history from patient or surrogate, ordering and performing treatments and interventions, ordering and review of laboratory studies, ordering and review of radiographic studies, pulse oximetry, re-evaluation of patient's condition and review of old charts     ____________________________________________   [place] / MDM / [place] / ED COURSE  As part of my medical decision making, I reviewed the following data within the electronic MEDICAL RECORD NUMBER  58 year old male presented with above-stated history and physical exam secondary to vomiting with abnormal laboratory data sodium of 123 potassium of 2.4 anion gap of 19.  Patient given 2 L  IV normal saline.  Patient given potassium chloride 40 mEq p.o. and potassium chloride 10 mg IV  times 4 doses.  Also given patient's known alcohol abuse CIWA protocol initiated.  Patient discussed with Dr. Sidney Ace for hospital admission further evaluation and management  ____________________________________________  FINAL CLINICAL IMPRESSION(S) / ED DIAGNOSES  Final diagnoses:  Hyponatremia  Hypokalemia     MEDICATIONS GIVEN DURING THIS VISIT:  Medications  potassium chloride 10 mEq in 100 mL IVPB (has no administration in time range)  potassium chloride 20 MEQ/15ML (10%) solution 40 mEq (has no administration in time range)  LORazepam (ATIVAN) injection 0-4 mg (has no administration in time range)    Or  LORazepam (ATIVAN) tablet 0-4 mg (has no administration in time range)  LORazepam (ATIVAN) injection 0-4 mg (has no  administration in time range)    Or  LORazepam (ATIVAN) tablet 0-4 mg (has no administration in time range)  thiamine (VITAMIN B-1) tablet 100 mg (has no administration in time range)    Or  thiamine (B-1) injection 100 mg (has no administration in time range)  sodium chloride 0.9 % bolus 1,000 mL (1,000 mLs Intravenous New Bag/Given 10/08/19 0020)  sodium chloride 0.9 % bolus 1,000 mL (1,000 mLs Intravenous New Bag/Given 10/08/19 0019)     ED Discharge Orders    None      *Please note:  Luke Malcomson. was evaluated in Emergency Department on 10/08/2019 for the symptoms described in the history of present illness. He was evaluated in the context of the global COVID-19 pandemic, which necessitated consideration that the patient might be at risk for infection with the SARS-CoV-2 virus that causes COVID-19. Institutional protocols and algorithms that pertain to the evaluation of patients at risk for COVID-19 are in a state of rapid change based on information released by regulatory bodies including the CDC and federal and state organizations. These policies and  algorithms were followed during the patient's care in the ED.  Some ED evaluations and interventions may be delayed as a result of limited staffing during the pandemic.*  Note:  This document was prepared using Dragon voice recognition software and may include unintentional dictation errors.   Gregor Hams, MD 10/08/19 [phone]

## 2019-10-08 NOTE — Telephone Encounter (Signed)
Noted, I have set reminder on his chart to sign form.  He left ER without signing AMA forms today.  Unsure about current HTN status and electrolyte balance.  I would like to see him in office next week to further discuss as I am concerned with him leaving ER and not having full work-up and treatment completed.

## 2019-10-08 NOTE — Telephone Encounter (Signed)
Called and let patient know that Luke Briggs was not in and will be back Monday. Let patient know that Luke Briggs is aware of the form as well as his eye doctors office as I spoke to them yesterday afternoon (see other phone encounter). Will route to provider for Monday.

## 2019-10-08 NOTE — ED Notes (Signed)
Hospitalist to bedside.

## 2019-10-08 NOTE — Telephone Encounter (Signed)
Pt is supposed to have cataract surgery on 11/24.  He went to the ED as instructed.  He states the eye dr needs the [facility] from [place] to proceed with this procedure.

## 2019-10-08 NOTE — H&P (Signed)
[place] [facility] [place] NAME: Luke Briggs    MR#:  [id number]  DATE OF BIRTH:  25-Feb-1961  DATE OF ADMISSION:  10/08/2019  PRIMARY CARE PHYSICIAN: [name], NP   REQUESTING/REFERRING PHYSICIAN: Marjean Donna, MD CHIEF COMPLAINT:   Chief Complaint  Patient presents with   Abnormal Lab    HISTORY OF PRESENT ILLNESS:  Luke Briggs  is a 58 y.o. Caucasian male with a known history of COPD, hypertension, schizophrenia GERD, presented to the emergency room with acute onset of intractable vomiting which apparently has been worsening lately.  He stated that he vomits about 2-3 times a day and has been doing so for the last year!.  He denied any diarrhea or melena or bright red being per rectum or abdominal pain.  No fever or chills.  No headache or dizziness or blurred vision.  No dysuria, oliguria or hematuria or flank pain.  No cough or wheezing or hemoptysis.  No recent Covid exposure.  He denied any loss of taste or smell.  Upon presenting to the emergency room, blood pressure was elevated 153/101 and later 170/98 and later has improved.  Vital signs otherwise were within normal.  Labs revealed hypokalemia hyponatremia with serum lipase of 32 and mild leukocytosis.  Two-view chest x-ray showed no acute cardiopulmonary disease.  EKG showed sinus rhythm with a rate of 81 with poor R wave progression.  The patient was given potassium replacement and hydration with IV normal saline.  He will be admitted to a medical monitored bed for further evaluation and management. PAST MEDICAL HISTORY:   Past Medical History:  Diagnosis Date   Angioedema 08/07/2019   Arthritis    COPD (chronic obstructive pulmonary disease) ([facility])    GERD (gastroesophageal reflux disease)    Hemorrhoids    Hypertension    Prostate disorder    PTSD (post-traumatic stress disorder)    Schizophrenia ([place])    Per patient's report.    PAST SURGICAL HISTORY:   Past  Surgical History:  Procedure Laterality Date   EYE SURGERY Left     SOCIAL HISTORY:   Social History   Tobacco Use   Smoking status: Current Every Day Smoker    Packs/day: 1.00    Years: 43.00    Pack years: 43.00    Types: Cigarettes   Smokeless tobacco: Never Used   Tobacco comment: .4 ppd now  Substance Use Topics   Alcohol use: Yes    Comment: beer on occasion    FAMILY HISTORY:   Family History  Problem Relation Age of Onset   Heart disease Mother    Osteoporosis Mother    Heart disease Father    Emphysema Father    Heart disease Sister    Emphysema Maternal Grandmother    Heart disease Maternal Grandfather    Osteoporosis Sister     DRUG ALLERGIES:   Allergies  Allergen Reactions   Lisinopril Swelling    Reaction: angioedema   Carvedilol Nausea And Vomiting   Penicillins Rash    Reaction: Feels like skin is on fire Did it involve swelling of the face/tongue/throat, SOB, or low BP? No Did it involve sudden or severe rash/hives, skin peeling, or any reaction on the inside of your mouth or nose? No Did you need to seek medical attention at a hospital or doctor's office? Yes When did it last happen?30 years ago If all above answers are NO, may proceed with cephalosporin use.  REVIEW OF SYSTEMS:   ROS As per history of present illness. All pertinent systems were reviewed above. Constitutional,  HEENT, cardiovascular, respiratory, GI, GU, musculoskeletal, neuro, psychiatric, endocrine,  integumentary and hematologic systems were reviewed and are otherwise  negative/unremarkable except for positive findings mentioned above in the HPI.   MEDICATIONS AT HOME:   Prior to Admission medications   Medication Sig Start Date End Date Taking? Authorizing Provider  albuterol (VENTOLIN HFA) 108 (90 Base) MCG/ACT inhaler Inhale 2 puffs into the lungs every 6 (six) hours as needed for wheezing or shortness of breath. 05/31/19  Yes Duffy Bruce, MD  chlorthalidone (HYGROTON) 25 MG tablet Take 0.5 tablets (12.5 mg total) by mouth daily. If tolerating in one week, then start taking whole pill (25 MG). 09/14/19  Yes [name], [name] T, NP  finasteride (PROSCAR) 5 MG tablet Take 1 tablet (5 mg total) by mouth daily. 04/28/19  Yes [name], [name] T, NP  hydrALAZINE (APRESOLINE) 10 MG tablet Take 1 tablet (10 mg total) by mouth 4 (four) times daily. 10/07/19  Yes [name], [name] T, NP  HYDROcodone-acetaminophen (NORCO/VICODIN) 5-325 MG tablet Take 1 tablet by mouth every 6 (six) hours as needed for moderate pain. 09/20/19 10/20/19 Yes [name], [name] T, NP  hydrocortisone (ANUSOL-HC) 2.5 % rectal cream Place 1 application rectally 2 (two) times daily. 07/29/19  Yes [name], [name] T, NP  Ipratropium-Albuterol (COMBIVENT) 20-100 MCG/ACT AERS respimat Inhale 1 puff into the lungs every 6 (six) hours. 06/15/19  Yes Letitia Neri L, PA-C  montelukast (SINGULAIR) 10 MG tablet Take 10 mg by mouth at bedtime.   Yes [provider]  Multiple Vitamin (MULTIVITAMIN WITH MINERALS) TABS tablet Take 1 tablet by mouth daily. 08/09/19  Yes Gouru, Illene Silver, MD  omeprazole (PRILOSEC) 40 MG capsule Take 1 capsule (40 mg total) by mouth daily. 04/28/19  Yes [name], [name] T, NP  tamsulosin (FLOMAX) 0.4 MG CAPS capsule Take 1 capsule (0.4 mg total) by mouth daily. 04/28/19  Yes [name], [name], NP  Tiotropium Bromide-Olodaterol 2.5-2.5 MCG/ACT AERS Inhale 2 puffs into the lungs daily. 08/17/19  Yes [name], [name] T, NP  triamcinolone cream (KENALOG) 0.1 % Apply 1 application topically 2 (two) times daily. 10/06/19  Yes [name], [name] T, NP  EPINEPHrine 0.3 mg/0.3 mL IJ SOAJ injection Inject 0.3 mLs (0.3 mg total) into the muscle as needed for anaphylaxis. 08/09/19   Nicholes Mango, MD      VITAL SIGNS:  Blood pressure 138/89, pulse 65, temperature 97.8 F (36.6 C), temperature source Oral, resp. rate 16, height 5\' 10"  (1.778 m), weight 81.6 kg, SpO2 96  %.  PHYSICAL EXAMINATION:  Physical Exam  GENERAL:  58 y.o.-year-old Caucasian male Patient lying in the bed with no acute distress.  EYES: Pupils equal, round, reactive to light and accommodation. No scleral icterus. Extraocular muscles intact.  HEENT: Head atraumatic, normocephalic. Oropharynx with slightly dry mucous membrane and tongue and nasopharynx clear.  NECK:  Supple, no jugular venous distention. No thyroid enlargement, no tenderness.  LUNGS: Normal breath sounds bilaterally, no wheezing, rales,rhonchi or crepitation. No use of accessory muscles of respiration.  CARDIOVASCULAR: Regular rate and rhythm, S1, S2 normal. No murmurs, rubs, or gallops.  ABDOMEN: Soft, nondistended, nontender. Bowel sounds present. No organomegaly or mass.  EXTREMITIES: No pedal edema, cyanosis, or clubbing.  NEUROLOGIC: Cranial nerves II through XII are intact. Muscle strength 5/5 in all extremities. Sensation intact. Gait not checked.  PSYCHIATRIC: The patient is alert and oriented x 3.  Normal affect  and good eye contact. SKIN: No obvious rash, lesion, or ulcer.   LABORATORY PANEL:   CBC Recent Labs  Lab 10/07/19 2143  WBC 11.6*  HGB 15.9  HCT 42.4  PLT 298   ------------------------------------------------------------------------------------------------------------------  Chemistries  Recent Labs  Lab 10/07/19 1513 10/07/19 2143  NA 123* 123*  K 2.7* 2.4*  CL 80* 81*  CO2 27 23  GLUCOSE 97 106*  BUN 14 16  CREATININE 1.01 0.79  CALCIUM 8.8* 8.9  MG 1.8  --   AST  --  38  ALT  --  24  ALKPHOS  --  75  BILITOT  --  1.2   ------------------------------------------------------------------------------------------------------------------  Cardiac Enzymes No results for input(s): TROPONINI in the last 168 hours. ------------------------------------------------------------------------------------------------------------------  RADIOLOGY:  Dg Chest 2 View  Result Date:  10/07/2019 CLINICAL DATA:  Shortness of breath. EXAM: CHEST - 2 VIEW COMPARISON:  Radiograph 08/07/2019. CT 9 days ago 09/28/2019 FINDINGS: The cardiomediastinal contours are normal. Mild emphysema. Hyperinflation. Pulmonary vasculature is normal. No consolidation, pleural effusion, or pneumothorax. No acute osseous abnormalities are seen. IMPRESSION: 1. No acute abnormality. 2. Mild emphysema and hyperinflation. Electronically Signed   By: Keith Rake M.D.   On: 10/07/2019 22:15      IMPRESSION AND PLAN:   1.  Intractable nausea and vomiting with subsequent dehydration.  The patient will be admitted to a medical monitored bed.  He will be placed on hydration with IV normal saline.  2.  Hyponatremia.  This is likely prerenal.  The patient will be hydrated with IV normal saline.  Fluid restrictions will be applied.  We will follow sodium level.  Of chlorthalidone.  3.  Hypokalemia.  Potassium will be replaced and magnesium level will be checked.  4.  Hypertension.  We will continue hydralazine and hold off chlorthalidone.  5.  COPD.  We will continue the patient's MDIs and Singulair.  6.  DVT prophylaxis.  Subcutaneous Lovenox.   All the records are reviewed and case discussed with ED provider. The plan of care was discussed in details with the patient (and family). I answered all questions. The patient agreed to proceed with the above mentioned plan. Further management will depend upon hospital course.   CODE STATUS: Full code  TOTAL TIME TAKING CARE OF THIS PATIENT: 50 minutes.    Christel Mormon M.D on 10/08/2019 at 6:09 AM  [facility]   From 7 PM-7 AM, contact night-coverage [email or web address]  CC: Primary care physician; [name], NP   Note: This dictation was prepared with Dragon dictation along with smaller phrase technology. Any transcriptional errors that result from this process are unintentional.

## 2019-10-08 NOTE — Discharge Instructions (Signed)

## 2019-10-11 ENCOUNTER — Other Ambulatory Visit: Payer: Self-pay

## 2019-10-11 ENCOUNTER — Encounter: Payer: Self-pay | Admitting: Nurse Practitioner

## 2019-10-11 ENCOUNTER — Ambulatory Visit: Payer: Self-pay | Admitting: Licensed Clinical Social Worker

## 2019-10-11 ENCOUNTER — Ambulatory Visit (INDEPENDENT_AMBULATORY_CARE_PROVIDER_SITE_OTHER): Payer: Medicaid Other | Admitting: Nurse Practitioner

## 2019-10-11 VITALS — BP 162/98 | HR 66 | Temp 98.0°F

## 2019-10-11 DIAGNOSIS — F10288 Alcohol dependence with other alcohol-induced disorder: Secondary | ICD-10-CM

## 2019-10-11 DIAGNOSIS — F17219 Nicotine dependence, cigarettes, with unspecified nicotine-induced disorders: Secondary | ICD-10-CM | POA: Diagnosis not present

## 2019-10-11 DIAGNOSIS — I10 Essential (primary) hypertension: Secondary | ICD-10-CM | POA: Diagnosis not present

## 2019-10-11 DIAGNOSIS — E876 Hypokalemia: Secondary | ICD-10-CM | POA: Diagnosis not present

## 2019-10-11 MED ORDER — HYDRALAZINE HCL 25 MG PO TABS: 25.0000 mg | ORAL_TABLET | Freq: Four times a day (QID) | ORAL | 2 refills | Status: DC

## 2019-10-11 MED ORDER — POTASSIUM CHLORIDE ER 10 MEQ PO TBCR: 20.0000 meq | EXTENDED_RELEASE_TABLET | Freq: Every day | ORAL | 0 refills | Status: DC

## 2019-10-11 NOTE — Progress Notes (Signed)
BP (!) 162/98 (BP Location: Left Arm, Patient Position: Sitting)   Pulse 66   Temp 98 F (36.7 C) (Oral)   SpO2 98%    Subjective:    Patient ID: Luke Pollen., male    DOB: 12/14/1960, 58 y.o.   MRN: [id number]  HPI: Luke Rogus. is a 58 y.o. male  Chief Complaint  Patient presents with  . Hypertension   HYPERTENSION Has had multiple reactions to BP medications.  Currently was taking Chlorthalidone and Hydralazine, with Chlorthalidone being discontinued during recent ER visit due to low NA+ (123 to 131) and K+ (2.4 to 2.8 -- was 2.8 when he left hospital after some replacement obtained).  He was seen in ER 10/07/2019 and left prior to seeing provider, this PCP instructed him to return due to low K+ and NA+ on labs obtained while there.  He returned on 10/08/2019 and did obtain fluids and K+ replacement, however he then left without signing AMA papers and further assessment/treatment.    During ER note he admitted to heavy daily ETOH use, drinking 10-15 beers a day. Yesterday reports he had only 4 beers.  States he has been drinking heavily since his fiance died 17 months ago.  Discussed option of therapy or rehab, states "no, I can just quit on my own I have done this before, I do not want to hear other people's problems".  He reports he did start back Chlorthalidone when he returned home.  Does endorse muscle aches intermittent, but denies SOB, CP, palpitations, diaphoresis.   Lengthy conversation with about leaving AMA and the PCP and patient relationship, with patient being a main part of the team and needing to adhere to directions provided to ensure best care.  Discussed effect of heavy alcohol use on BP and that with smoking and heavy drinking it will make controlling BP more difficult, to which he stated "I did not realize alcohol did that".   Hypertension status: uncontrolled  Satisfied with current treatment? yes Duration of hypertension: chronic BP monitoring  frequency:  daily BP range: 160-170/100's BP medication side effects:  no Medication compliance: good compliance Previous BP meds: Lisinopril, Amlodipine, HCTZ, Carvedilol Aspirin: yes Recurrent headaches: no Visual changes: no Palpitations: no Dyspnea: no Chest pain: no Lower extremity edema: no Dizzy/lightheaded: no  Relevant past medical, surgical, family and social history reviewed and updated as indicated. Interim medical history since our last visit reviewed. Allergies and medications reviewed and updated.  Review of Systems  Constitutional: Negative for activity change, diaphoresis, fatigue and fever.  Respiratory: Negative for cough, chest tightness, shortness of breath and wheezing.   Cardiovascular: Negative for chest pain, palpitations and leg swelling.  Gastrointestinal: Negative for abdominal distention, abdominal pain, constipation, diarrhea, nausea and vomiting.  Musculoskeletal: Positive for myalgias (occasional).  Skin: Negative.   Neurological: Negative for dizziness, syncope, weakness, light-headedness, numbness and headaches.  Psychiatric/Behavioral: Negative.     Per HPI unless specifically indicated above     Objective:    BP (!) 162/98 (BP Location: Left Arm, Patient Position: Sitting)   Pulse 66   Temp 98 F (36.7 C) (Oral)   SpO2 98%   Wt Readings from Last 3 Encounters:  10/07/19 180 lb (81.6 kg)  10/07/19 180 lb (81.6 kg)  09/28/19 175 lb (79.4 kg)    Physical Exam Vitals signs and nursing note reviewed.  Constitutional:      General: He is awake. He is not in acute distress.  Appearance: He is well-developed and well-groomed. He is not ill-appearing.  HENT:     Head: Normocephalic and atraumatic.     Right Ear: Hearing normal. No drainage.     Left Ear: Hearing normal. No drainage.  Eyes:     General: Lids are normal.        Right eye: No discharge.        Left eye: No discharge.     Conjunctiva/sclera: Conjunctivae normal.      Pupils: Pupils are equal, round, and reactive to light.  Neck:     Musculoskeletal: Normal range of motion and neck supple.     Vascular: No carotid bruit.  Cardiovascular:     Rate and Rhythm: Normal rate and regular rhythm.     Heart sounds: Normal heart sounds, S1 normal and S2 normal. No murmur. No gallop.   Pulmonary:     Effort: Pulmonary effort is normal. No accessory muscle usage or respiratory distress.     Breath sounds: Normal breath sounds.  Abdominal:     General: Bowel sounds are normal.     Palpations: Abdomen is soft.  Musculoskeletal: Normal range of motion.     Right lower leg: No edema.     Left lower leg: No edema.  Skin:    General: Skin is warm and dry.  Neurological:     Mental Status: He is alert and oriented to person, place, and time.     Deep Tendon Reflexes: Reflexes are normal and symmetric.  Psychiatric:        Mood and Affect: Mood normal.        Behavior: Behavior normal. Behavior is cooperative.        Thought Content: Thought content normal.        Judgment: Judgment normal.     Results for orders placed or performed during the hospital encounter of 10/08/19  CBC  Result Value Ref Range   WBC 11.6 (H) 4.0 - 10.5 K/uL   RBC 5.13 4.22 - 5.81 MIL/uL   Hemoglobin 15.9 13.0 - 17.0 g/dL   HCT 42.4 39.0 - 52.0 %   MCV 82.7 80.0 - 100.0 fL   MCH 31.0 26.0 - 34.0 pg   MCHC 37.5 (H) 30.0 - 36.0 g/dL   RDW 11.5 11.5 - 15.5 %   Platelets 298 150 - 400 K/uL   nRBC 0.0 0.0 - 0.2 %  Comprehensive metabolic panel  Result Value Ref Range   Sodium 123 (L) 135 - 145 mmol/L   Potassium 2.4 (LL) 3.5 - 5.1 mmol/L   Chloride 81 (L) 98 - 111 mmol/L   CO2 23 22 - 32 mmol/L   Glucose, Bld 106 (H) 70 - 99 mg/dL   BUN 16 6 - 20 mg/dL   Creatinine, Ser 0.79 0.61 - 1.24 mg/dL   Calcium 8.9 8.9 - 10.3 mg/dL   Total Protein 7.6 6.5 - 8.1 g/dL   Albumin 3.9 3.5 - 5.0 g/dL   AST 38 15 - 41 U/L   ALT 24 0 - 44 U/L   Alkaline Phosphatase 75 38 - 126 U/L   Total  Bilirubin 1.2 0.3 - 1.2 mg/dL   GFR calc non Af Amer >60 >60 mL/min   GFR calc Af Amer >60 >60 mL/min   Anion gap 19 (H) 5 - 15  Lipase, blood  Result Value Ref Range   Lipase 32 11 - 51 U/L  Magnesium  Result Value Ref Range   Magnesium 1.8 1.7 -  2.4 mg/dL  CBC  Result Value Ref Range   WBC 6.5 4.0 - 10.5 K/uL   RBC 4.75 4.22 - 5.81 MIL/uL   Hemoglobin 14.7 13.0 - 17.0 g/dL   HCT 40.0 39.0 - 52.0 %   MCV 84.2 80.0 - 100.0 fL   MCH 30.9 26.0 - 34.0 pg   MCHC 36.8 (H) 30.0 - 36.0 g/dL   RDW 11.9 11.5 - 15.5 %   Platelets 267 150 - 400 K/uL   nRBC 0.0 0.0 - 0.2 %  Comprehensive metabolic panel  Result Value Ref Range   Sodium 131 (L) 135 - 145 mmol/L   Potassium 2.8 (L) 3.5 - 5.1 mmol/L   Chloride 92 (L) 98 - 111 mmol/L   CO2 28 22 - 32 mmol/L   Glucose, Bld 100 (H) 70 - 99 mg/dL   BUN 14 6 - 20 mg/dL   Creatinine, Ser 0.69 0.61 - 1.24 mg/dL   Calcium 8.0 (L) 8.9 - 10.3 mg/dL   Total Protein 6.6 6.5 - 8.1 g/dL   Albumin 3.4 (L) 3.5 - 5.0 g/dL   AST 28 15 - 41 U/L   ALT 20 0 - 44 U/L   Alkaline Phosphatase 60 38 - 126 U/L   Total Bilirubin 1.5 (H) 0.3 - 1.2 mg/dL   GFR calc non Af Amer >60 >60 mL/min   GFR calc Af Amer >60 >60 mL/min   Anion gap 11 5 - 15  Osmolality  Result Value Ref Range   Osmolality 261 (L) 275 - 295 mOsm/kg  TSH  Result Value Ref Range   TSH 0.750 0.350 - 4.500 uIU/mL  POC SARS Coronavirus 2 Ag  Result Value Ref Range   SARS Coronavirus 2 Ag NEGATIVE NEGATIVE      Assessment & Plan:   Problem List Items Addressed This Visit      Cardiovascular and Mediastinum   Uncontrolled hypertension    Chronic, ongoing with recent low NA+ and K+.  Have recommended he hold Chlorthalidone until PCP further lab assessment.  Recommend COMPLETE cessation of alcohol and tobacco use.  Will increase Hydralazine to 25 MG QID, script sent.  Obtain CBC and CMP today, suspect K+ will still be on low side.  Script for oral K+ sent to initiate.  He is aware that if  concern on repeat labs tomorrow and PCP feels IV replacement needed he will need to return to ER and if ongoing poor adherence to plan of care will discharge from PCP care.  If any worsening symptoms prior to return of labs he is IMMEDIATELY to go to ER.  He was able to verbalize this plan back.  Return in one week.      Relevant Medications   hydrALAZINE (APRESOLINE) 25 MG tablet   Other Relevant Orders   CBC with Differential/Platelet out   Comprehensive metabolic panel     Nervous and Auditory   Nicotine dependence, cigarettes, w unsp disorders    I have recommended complete cessation of tobacco use. I have discussed various options available for assistance with tobacco cessation including over the counter methods (Nicotine gum, patch and lozenges). We also discussed prescription options (Chantix, Nicotine Inhaler / Nasal Spray). The patient is not interested in pursuing any prescription tobacco cessation options at this time.         Other   Alcohol dependence ([place]) - Primary    At length discussion with patient about effect of alcohol use on overall health, including elevation in BP  and electrolyte imbalances.  He refuses therapy, medication, or rehab to assist in cessation reporting he can do this on his own.  CCM team is involved and will alert them.  Recommend complete cessation of alcohol.  Obtain CBC and CMP today, suspect K+ will still be on low side.  Script for oral K+ sent to initiate.  He is aware that if concern on repeat labs tomorrow and PCP feels IV replacement needed he will need to return to ER and if ongoing poor adherence to plan of care will discharge from PCP care.      Hypokalemia    Obtain CBC and CMP today, suspect K+ will still be on low side.  Script for oral K+ sent to initiate.  He is aware that if concern on repeat labs tomorrow and PCP feels IV replacement needed he will need to return to ER and if ongoing poor adherence to plan of care will discharge from PCP  care.  Recommend cutting back to work towards complete cessation of alcohol use.      Relevant Orders   Comprehensive metabolic panel   Basic Metabolic Panel (BMET)       Follow up plan: Return in about 1 week (around 10/18/2019) for BP visit.

## 2019-10-11 NOTE — Patient Instructions (Addendum)
STOP Chlorthalidone until you hear from me INCREASE Hydralazine to 25 MG four times a day Start potassium replacement DECREASE beer intake.  Hypokalemia Hypokalemia means that the amount of potassium in the blood is lower than normal. Potassium is a chemical (electrolyte) that helps regulate the amount of fluid in the body. It also stimulates muscle tightening (contraction) and helps nerves work properly. Normally, most of the body's potassium is inside cells, and only a very small amount is in the blood. Because the amount in the blood is so small, minor changes to potassium levels in the blood can be life-threatening. What are the causes? This condition may be caused by:  Antibiotic medicine.  Diarrhea or vomiting. Taking too much of a medicine that helps you have a bowel movement (laxative) can cause diarrhea and lead to hypokalemia.  Chronic kidney disease (CKD).  Medicines that help the body get rid of excess fluid (diuretics).  Eating disorders, such as bulimia.  Low magnesium levels in the body.  Sweating a lot. What are the signs or symptoms? Symptoms of this condition include:  Weakness.  Constipation.  Fatigue.  Muscle cramps.  Mental confusion.  Skipped heartbeats or irregular heartbeat (palpitations).  Tingling or numbness. How is this diagnosed? This condition is diagnosed with a blood test. How is this treated? This condition may be treated by:  Taking potassium supplements by mouth.  Adjusting the medicines that you take.  Eating more foods that contain a lot of potassium. If your potassium level is very low, you may need to get potassium through an IV and be monitored in the hospital. Follow these instructions at home:   Take over-the-counter and prescription medicines only as told by your health care provider. This includes vitamins and supplements.  Eat a healthy diet. A healthy diet includes fresh fruits and vegetables, whole grains, healthy  fats, and lean proteins.  If instructed, eat more foods that contain a lot of potassium. This includes: ? Nuts, such as peanuts and pistachios. ? Seeds, such as sunflower seeds and pumpkin seeds. ? Peas, lentils, and lima beans. ? Whole grain and bran cereals and breads. ? Fresh fruits and vegetables, such as apricots, avocado, bananas, cantaloupe, kiwi, oranges, tomatoes, asparagus, and potatoes. ? Orange juice. ? Tomato juice. ? Red meats. ? Yogurt.  Keep all follow-up visits as told by your health care provider. This is important. Contact a health care provider if you:  Have weakness that gets worse.  Feel your heart pounding or racing.  Vomit.  Have diarrhea.  Have diabetes (diabetes mellitus) and you have trouble keeping your blood sugar (glucose) in your target range. Get help right away if you:  Have chest pain.  Have shortness of breath.  Have vomiting or diarrhea that lasts for more than 2 days.  Faint. Summary  Hypokalemia means that the amount of potassium in the blood is lower than normal.  This condition is diagnosed with a blood test.  Hypokalemia may be treated by taking potassium supplements, adjusting the medicines that you take, or eating more foods that are high in potassium.  If your potassium level is very low, you may need to get potassium through an IV and be monitored in the hospital. This information is not intended to replace advice given to you by your health care provider. Make sure you discuss any questions you have with your health care provider. Document Released: 11/04/2005 Document Revised: 06/17/2018 Document Reviewed: 06/17/2018 Elsevier Patient Education  2020 [employer].

## 2019-10-11 NOTE — Assessment & Plan Note (Addendum)
Obtain CBC and CMP today, suspect K+ will still be on low side.  Script for oral K+ sent to initiate.  He is aware that if concern on repeat labs tomorrow and PCP feels IV replacement needed he will need to return to ER and if ongoing poor adherence to plan of care will discharge from PCP care.  Recommend cutting back to work towards complete cessation of alcohol use.

## 2019-10-11 NOTE — Assessment & Plan Note (Signed)
I have recommended complete cessation of tobacco use. I have discussed various options available for assistance with tobacco cessation including over the counter methods (Nicotine gum, patch and lozenges). We also discussed prescription options (Chantix, Nicotine Inhaler / Nasal Spray). The patient is not interested in pursuing any prescription tobacco cessation options at this time.  

## 2019-10-11 NOTE — Assessment & Plan Note (Signed)
Chronic, ongoing with recent low NA+ and K+.  Have recommended he hold Chlorthalidone until PCP further lab assessment.  Recommend COMPLETE cessation of alcohol and tobacco use.  Will increase Hydralazine to 25 MG QID, script sent.  Obtain CBC and CMP today, suspect K+ will still be on low side.  Script for oral K+ sent to initiate.  He is aware that if concern on repeat labs tomorrow and PCP feels IV replacement needed he will need to return to ER and if ongoing poor adherence to plan of care will discharge from PCP care.  If any worsening symptoms prior to return of labs he is IMMEDIATELY to go to ER.  He was able to verbalize this plan back.  Return in one week.

## 2019-10-11 NOTE — Assessment & Plan Note (Signed)
At length discussion with patient about effect of alcohol use on overall health, including elevation in BP and electrolyte imbalances.  He refuses therapy, medication, or rehab to assist in cessation reporting he can do this on his own.  CCM team is involved and will alert them.  Recommend complete cessation of alcohol.  Obtain CBC and CMP today, suspect K+ will still be on low side.  Script for oral K+ sent to initiate.  He is aware that if concern on repeat labs tomorrow and PCP feels IV replacement needed he will need to return to ER and if ongoing poor adherence to plan of care will discharge from PCP care.

## 2019-10-12 ENCOUNTER — Telehealth: Payer: Self-pay

## 2019-10-12 ENCOUNTER — Ambulatory Visit: Payer: Medicaid Other | Admitting: Nurse Practitioner

## 2019-10-12 ENCOUNTER — Ambulatory Visit: Admission: RE | Admit: 2019-10-12 | Payer: Medicaid Other | Source: Home / Self Care | Admitting: Ophthalmology

## 2019-10-12 LAB — CBC WITH DIFFERENTIAL/PLATELET
Basophils Absolute: 0.1 10*3/uL (ref 0.0–0.2)
Basos: 1 %
EOS (ABSOLUTE): 0.1 10*3/uL (ref 0.0–0.4)
Eos: 1 %
Hematocrit: 50.8 % (ref 37.5–51.0)
Hemoglobin: 17 g/dL (ref 13.0–17.7)
Immature Grans (Abs): 0 10*3/uL (ref 0.0–0.1)
Immature Granulocytes: 0 %
Lymphocytes Absolute: 1.8 10*3/uL (ref 0.7–3.1)
Lymphs: 18 %
MCH: 30.7 pg (ref 26.6–33.0)
MCHC: 33.5 g/dL (ref 31.5–35.7)
MCV: 92 fL (ref 79–97)
Monocytes Absolute: 0.9 10*3/uL (ref 0.1–0.9)
Monocytes: 9 %
Neutrophils Absolute: 7.1 10*3/uL — ABNORMAL HIGH (ref 1.4–7.0)
Neutrophils: 71 %
Platelets: 347 10*3/uL (ref 150–450)
RBC: 5.53 x10E6/uL (ref 4.14–5.80)
RDW: 12.4 % (ref 11.6–15.4)
WBC: 10 10*3/uL (ref 3.4–10.8)

## 2019-10-12 LAB — COMPREHENSIVE METABOLIC PANEL
ALT: 22 IU/L (ref 0–44)
AST: 27 IU/L (ref 0–40)
Albumin/Globulin Ratio: 1.4 (ref 1.2–2.2)
Albumin: 4.3 g/dL (ref 3.8–4.9)
Alkaline Phosphatase: 93 IU/L (ref 39–117)
BUN/Creatinine Ratio: 15 (ref 9–20)
BUN: 13 mg/dL (ref 6–24)
Bilirubin Total: 0.8 mg/dL (ref 0.0–1.2)
CO2: 26 mmol/L (ref 20–29)
Calcium: 9.1 mg/dL (ref 8.7–10.2)
Chloride: 92 mmol/L — ABNORMAL LOW (ref 96–106)
Creatinine, Ser: 0.89 mg/dL (ref 0.76–1.27)
GFR calc Af Amer: 110 mL/min/{1.73_m2} (ref >59)
GFR calc non Af Amer: 96 mL/min/{1.73_m2} (ref >59)
Globulin, Total: 3.1 g/dL (ref 1.5–4.5)
Glucose: 83 mg/dL (ref 65–99)
Potassium: 3.4 mmol/L — ABNORMAL LOW (ref 3.5–5.2)
Sodium: 136 mmol/L (ref 134–144)
Total Protein: 7.4 g/dL (ref 6.0–8.5)

## 2019-10-12 SURGERY — PHACOEMULSIFICATION, CATARACT, WITH IOL INSERTION
Anesthesia: Topical | Laterality: Left

## 2019-10-12 NOTE — Chronic Care Management (AMB) (Signed)
  Care Management   Follow Up Note   10/12/2019 Name: Zyhir Niemela. MRN: [id number] DOB: May 29, 1961  Referred by: [name], NP Reason for referral : Care Coordination   [name]. is a 58 y.o. year old male who is a primary care patient of [name], Barbaraann Faster, NP. The care management team was consulted for assistance with care management and care coordination needs.    Review of patient status, including review of consultants reports, relevant laboratory and other test results, and collaboration with appropriate care team members and the patient's provider was performed as part of comprehensive patient evaluation and provision of chronic care management services.    LCSW completed CCM outreach attempt today but was unable to reach patient successfully. A HIPPA compliant voice message was unable to be left as mailbox was full. LCSW rescheduled CCM SW appointment as well.  Eula Fried, BSW, MSW, [facility] Care Management [email or web address] Phone: [phone]

## 2019-10-13 ENCOUNTER — Other Ambulatory Visit: Payer: Medicaid Other

## 2019-10-19 ENCOUNTER — Ambulatory Visit: Payer: Self-pay | Admitting: Pharmacist

## 2019-10-19 DIAGNOSIS — I10 Essential (primary) hypertension: Secondary | ICD-10-CM

## 2019-10-19 NOTE — Patient Instructions (Signed)
Visit Information  Goals Addressed            This Visit's Progress     Patient Stated   . PharmD "My blood pressure is uncontrolled" (pt-stated)       Current Barriers:  . Uncontrolled hypertension, complicated by BPH, PTSD, tobacco abuse, schizophrenia o Reports that he "feels bad" today, likely related to BP. Does not relay any specific symptoms . Current antihypertensive regimen: hydralazine 25 mg QID . Previous antihypertensives tried:  o Lisinopril (angioedema) o HCTZ- stopped because he thought it was just a "water pill" o Amlodipine- dizziness and lethargy o Clonidine - significant dry mouth, lethargy o Carvedilol - reported nausea and vomiting immediately after ingestion, recurred multiple times o Chlorthalidone - hyponatremia, though complicated by chronic alcoholism  . Current home BP readings: reports 170s/100s . Still smoking ~1/2 ppd  . Of note, has been taking both Stiolto and Spiriva   Pharmacist Clinical Goal(s):  [name] Over the next 90 days, patient will work with PharmD and providers to optimize antihypertensive regimen  Interventions: . Comprehensive medication review performed, medication list updated in electronic medical record . Patient reports adherence to current regimen of hydralazine 25 mg four times daily. Confirms that he is taking ~every 6 hours.  . Confirmed appointment on Thursday w/ cardiology. Encouraged to continue to check BP, and bring BP readings and medications to the appointment. . Counseled that [place] is overlap, and to continue Stiolto only. Patient verbalized understanding.   Patient Self Care Activities:  . Patient will continue to check BP daily , document, and provide at future appointments . Patient will focus on medication adherence  Please see past updates related to this goal by clicking on the "Past Updates" button in the selected goal         The patient verbalized understanding of instructions provided today  and declined a print copy of patient instruction materials.    Plan: - CCM team will outreach patient in the next ~3-4 weeks for continued support  Catie Darnelle Maffucci, PharmD, [place] [phone]

## 2019-10-19 NOTE — Chronic Care Management (AMB) (Signed)
Chronic Care Management   Follow Up Note   10/19/2019 Name: Luke Briggs. MRN: [id number] DOB: 1960-12-20  Referred by: [name], NP Reason for referral : Chronic Care Management (Medication Management)   Luke Briggs. is a 57 y.o. year old male who is a primary care patient of [name], Barbaraann Faster, NP. The CCM team was consulted for assistance with chronic disease management and care coordination needs.    Contacted patient for medication management review today.   Review of patient status, including review of consultants reports, relevant laboratory and other test results, and collaboration with appropriate care team members and the patient's provider was performed as part of comprehensive patient evaluation and provision of chronic care management services.    SDOH (Social Determinants of Health) screening performed today: Alcohol/Substance Use. See Care Plan for related entries.   Outpatient Encounter Medications as of 10/19/2019  Medication Sig Note  . albuterol (VENTOLIN HFA) 108 (90 Base) MCG/ACT inhaler Inhale 2 puffs into the lungs every 6 (six) hours as needed for wheezing or shortness of breath.   . finasteride (PROSCAR) 5 MG tablet Take 1 tablet (5 mg total) by mouth daily.   . hydrALAZINE (APRESOLINE) 25 MG tablet Take 1 tablet (25 mg total) by mouth 4 (four) times daily.   [name] HYDROcodone-acetaminophen (NORCO/VICODIN) 5-325 MG tablet Take 1 tablet by mouth every 6 (six) hours as needed for moderate pain. 10/19/2019: Taking QID  . Multiple Vitamin (MULTIVITAMIN WITH MINERALS) TABS tablet Take 1 tablet by mouth daily.   [name] omeprazole (PRILOSEC) 40 MG capsule Take 1 capsule (40 mg total) by mouth daily.   . Potassium 99 MG TABS Take 1 tablet by mouth daily.   . tamsulosin (FLOMAX) 0.4 MG CAPS capsule Take 1 capsule (0.4 mg total) by mouth daily.   . chlorthalidone (HYGROTON) 25 MG tablet Take 0.5 tablets (12.5 mg total) by mouth daily. If tolerating in one week,  then start taking whole pill (25 MG). (Patient not taking: Reported on 10/19/2019)   . EPINEPHrine 0.3 mg/0.3 mL IJ SOAJ injection Inject 0.3 mLs (0.3 mg total) into the muscle as needed for anaphylaxis. (Patient not taking: Reported on 10/19/2019)   . hydrocortisone (ANUSOL-HC) 2.5 % rectal cream Place 1 application rectally 2 (two) times daily. (Patient not taking: Reported on 10/19/2019)   . Ipratropium-Albuterol (COMBIVENT) 20-100 MCG/ACT AERS respimat Inhale 1 puff into the lungs every 6 (six) hours. (Patient not taking: Reported on 10/19/2019) 08/17/2019: Using 1-2 times daily   . montelukast (SINGULAIR) 10 MG tablet Take 10 mg by mouth at bedtime.   . Tiotropium Bromide-Olodaterol 2.5-2.5 MCG/ACT AERS Inhale 2 puffs into the lungs daily. (Patient not taking: Reported on 10/19/2019)   . triamcinolone cream (KENALOG) 0.1 % Apply 1 application topically 2 (two) times daily. (Patient not taking: Reported on 10/19/2019)   . [DISCONTINUED] potassium chloride (KLOR-CON) 10 MEQ tablet Take 2 tablets (20 mEq total) by mouth daily for 5 days.    No facility-administered encounter medications on file as of 10/19/2019.      Goals Addressed            This Visit's Progress     Patient Stated   . PharmD "My blood pressure is uncontrolled" (pt-stated)       Current Barriers:  . Uncontrolled hypertension, complicated by BPH, PTSD, tobacco abuse, schizophrenia o Reports that he "feels bad" today, likely related to BP. Does not relay any specific symptoms . Current antihypertensive regimen: hydralazine  25 mg QID . Previous antihypertensives tried:  o Lisinopril (angioedema) o HCTZ- stopped because he thought it was just a "water pill" o Amlodipine- dizziness and lethargy o Clonidine - significant dry mouth, lethargy o Carvedilol - reported nausea and vomiting immediately after ingestion, recurred multiple times o Chlorthalidone - hyponatremia, though complicated by chronic alcoholism  . Current home BP  readings: reports 170s/100s . Still smoking ~1/2 ppd  . Of note, has been taking both Stiolto and Spiriva   Pharmacist Clinical Goal(s):  [name] Over the next 90 days, patient will work with PharmD and providers to optimize antihypertensive regimen  Interventions: . Comprehensive medication review performed, medication list updated in electronic medical record . Patient reports adherence to current regimen of hydralazine 25 mg four times daily. Confirms that he is taking ~every 6 hours.  . Confirmed appointment on Thursday w/ cardiology. Encouraged to continue to check BP, and bring BP readings and medications to the appointment. . Counseled that [place] is overlap, and to continue Stiolto only. Patient verbalized understanding.   Patient Self Care Activities:  . Patient will continue to check BP daily , document, and provide at future appointments . Patient will focus on medication adherence  Please see past updates related to this goal by clicking on the "Past Updates" button in the selected goal          Plan: - CCM team will outreach patient in the next ~3-4 weeks for continued support  Catie Darnelle Maffucci, PharmD, [address] [phone]

## 2019-10-21 ENCOUNTER — Encounter: Payer: Self-pay | Admitting: Cardiology

## 2019-10-21 ENCOUNTER — Other Ambulatory Visit: Payer: Self-pay

## 2019-10-21 ENCOUNTER — Ambulatory Visit (INDEPENDENT_AMBULATORY_CARE_PROVIDER_SITE_OTHER): Payer: Medicaid Other | Admitting: Cardiology

## 2019-10-21 VITALS — BP 190/106 | HR 96 | Temp 97.2°F | Ht 70.0 in | Wt 185.0 lb

## 2019-10-21 DIAGNOSIS — R0602 Shortness of breath: Secondary | ICD-10-CM

## 2019-10-21 DIAGNOSIS — I10 Essential (primary) hypertension: Secondary | ICD-10-CM | POA: Diagnosis not present

## 2019-10-21 DIAGNOSIS — F1010 Alcohol abuse, uncomplicated: Secondary | ICD-10-CM | POA: Diagnosis not present

## 2019-10-21 DIAGNOSIS — F17200 Nicotine dependence, unspecified, uncomplicated: Secondary | ICD-10-CM

## 2019-10-21 MED ORDER — HYDRALAZINE HCL 50 MG PO TABS: 50.0000 mg | ORAL_TABLET | Freq: Three times a day (TID) | ORAL | 2 refills | Status: DC

## 2019-10-21 NOTE — Progress Notes (Signed)
Cardiology Office Note:    Date:  10/21/2019   ID:  Luke Briggs., DOB [birth year], MRN [id number]  PCP:  Luke Lick, NP  Cardiologist:  Luke Sable, MD  Electrophysiologist:  None   Referring MD: Luke Lick, NP   Chief Complaint  Patient presents with  . New Patient (Initial Visit)    Chest pain, Palpitations, and Hypertension; Meds verbally reviewed with patient.    History of Present Illness:    Luke Briggs. is a 58 y.o. 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  Patient states having poorly controlled blood pressures for years now.  He has tried several medications but stopped due to adverse effects.  Lisinopril caused angioedema, carvedilol caused nausea/vomiting, amlodipine caused severe dizziness.  He has been drinking heavily for the past 18 months after losing his fiance.  He drinks 10-15 beers daily.  He states he has been trying to cut back.  He also is a current smoker for over 40 years.  He denies chest pain, but states he could feel his heart beat ringing in his ears.  Patient has also tried chlorthalidone but was found to have hyponatremia and hypokalemia.  At the time, he was seen in emergency room for vomiting.  Blood pressure was noted to be elevated at 170/98.  He was started on hydralazine.  He seems to be tolerating the hydralazine okay with no adverse effects.  He notes shortness of breath sometimes when he walks but denies chest pain.  Past Medical History:  Diagnosis Date  . Angioedema 08/07/2019  . Arthritis   . COPD (chronic obstructive pulmonary disease) ([place])   . GERD (gastroesophageal reflux disease)   . Hemorrhoids   . Hypertension   . Prostate disorder   . PTSD (post-traumatic stress disorder)   . Schizophrenia ([place])    Per patient's report.    Past Surgical History:  Procedure  Laterality Date  . EYE SURGERY Left     Current Medications: Current Meds  Medication Sig  . albuterol (VENTOLIN HFA) 108 (90 Base) MCG/ACT inhaler Inhale 2 puffs into the lungs every 6 (six) hours as needed for wheezing or shortness of breath.  . EPINEPHrine 0.3 mg/0.3 mL IJ SOAJ injection Inject 0.3 mLs (0.3 mg total) into the muscle as needed for anaphylaxis.  . finasteride (PROSCAR) 5 MG tablet Take 1 tablet (5 mg total) by mouth daily.  . hydrocortisone (ANUSOL-HC) 2.5 % rectal cream Place 1 application rectally 2 (two) times daily. (Patient taking differently: Place 1 application rectally 2 (two) times daily as needed. )  . Ipratropium-Albuterol (COMBIVENT) 20-100 MCG/ACT AERS respimat Inhale 1 puff into the lungs every 6 (six) hours.  . montelukast (SINGULAIR) 10 MG tablet Take 10 mg by mouth at bedtime.  . Multiple Vitamin (MULTIVITAMIN WITH MINERALS) TABS tablet Take 1 tablet by mouth daily.  [name] omeprazole (PRILOSEC) 40 MG capsule Take 1 capsule (40 mg total) by mouth daily.  . Potassium 99 MG TABS Take 1 tablet by mouth daily.  . tamsulosin (FLOMAX) 0.4 MG CAPS capsule Take 1 capsule (0.4 mg total) by mouth daily.  [name] triamcinolone cream (KENALOG) 0.1 % Apply 1 application topically 2 (two) times daily.  . [DISCONTINUED] hydrALAZINE (APRESOLINE) 25 MG tablet Take 1 tablet (25 mg total) by mouth 4 (four) times daily.     Allergies:   Lisinopril, Amlodipine,  Carvedilol, and Penicillins   Social History   Socioeconomic History  . Marital status: Significant Other    Spouse name: Not on file  . Number of children: Not on file  . Years of education: Not on file  . Highest education level: Not on file  Occupational History  . Not on file  Social Needs  . Financial resource strain: Not very hard  . Food insecurity    Worry: Never true    Inability: Never true  . Transportation needs    Medical: Yes    Non-medical: Yes  Tobacco Use  . Smoking status: Current Every Day Smoker     Packs/day: 1.00    Years: 43.00    Pack years: 43.00    Types: Cigarettes  . Smokeless tobacco: Never Used  . Tobacco comment: .75 ppd now  Substance and Sexual Activity  . Alcohol use: Yes    Comment: beer on occasion  . Drug use: Never  . Sexual activity: Not on file  Lifestyle  . Physical activity    Days per week: 0 days    Minutes per session: 0 min  . Stress: Not on file  Relationships  . Social connections    Talks on phone: More than three times a week    Gets together: Not on file    Attends religious service: Not on file    Active member of club or organization: Not on file    Attends meetings of clubs or organizations: Not on file    Relationship status: Living with partner  Other Topics Concern  . Not on file  Social History Narrative   Patient has new girlfriend they have been dating almost a year.      Family History: The patient's family history includes Emphysema in his father and maternal grandmother; Heart disease in his father, maternal grandfather, mother, and sister; Osteoporosis in his mother and sister.  ROS:   Please see the history of present illness.     All other systems reviewed and are negative.  EKGs/Labs/Other Studies Reviewed:    The following studies were reviewed today:   EKG:  EKG is  ordered today.  The ekg ordered today demonstrates normal sinus rhythm, normal ECG.  Recent Labs: 07/06/2019: B Natriuretic Peptide 19.0 10/07/2019: Magnesium 1.8; TSH 0.750 10/11/2019: ALT 22; BUN 13; Creatinine, Ser 0.89; Hemoglobin 17.0; Platelets 347; Potassium 3.4; Sodium 136  Recent Lipid Panel    Component Value Date/Time   CHOL 166 05/05/2019 1424   TRIG 121 05/05/2019 1424   HDL 56 05/05/2019 1424   LDLCALC 86 05/05/2019 1424    Physical Exam:    VS:  BP (!) 190/106 (BP Location: Right Arm, Patient Position: Sitting, Cuff Size: Normal)   Pulse 96   Temp (!) 97.2 F (36.2 C)   Ht 5\' 10"  (1.778 m)   Wt 185 lb (83.9 kg)   SpO2  99%   BMI 26.54 kg/m     Wt Readings from Last 3 Encounters:  10/21/19 185 lb (83.9 kg)  10/07/19 180 lb (81.6 kg)  10/07/19 180 lb (81.6 kg)     GEN:  Well nourished, well developed in no acute distress HEENT: Normal NECK: No JVD; No carotid bruits LYMPHATICS: No lymphadenopathy CARDIAC: RRR, no murmurs, rubs, gallops RESPIRATORY:  Clear to auscultation without rales, wheezing or rhonchi  ABDOMEN: Soft, non-tender, non-distended MUSCULOSKELETAL:  No edema; No deformity  SKIN: Warm and dry NEUROLOGIC:  Alert and oriented x 3 PSYCHIATRIC:  Normal affect   ASSESSMENT:   Heavy alcohol use is contributing to his elevated blood pressure. 1. Hypertension, unspecified type   2. SOB (shortness of breath)   3. Smoking   4. ETOH abuse    PLAN:    In order of problems listed above:  1. Increase hydralazine to 50 mg 3 times daily.  Patient encouraged to check blood pressure daily.  Plan to titrate hydralazine to obtain adequate BP control. 2. Likely due to COPD.  Due to longstanding hypertension, get echo to evaluate possible LVH/diastolic function. 3. Smoking cessation advised. 4. EtOH cessation advised.  Patient agrees and states he will try to decrease consumption.  Follow-up in 2 weeks.  This note was generated in part or whole with voice recognition software. Voice recognition is usually quite accurate but there are transcription errors that can and very often do occur. I apologize for any typographical errors that were not detected and corrected.  Medication Adjustments/Labs and Tests Ordered: Current medicines are reviewed at length with the patient today.  Concerns regarding medicines are outlined above.  Orders Placed This Encounter  Procedures  . EKG 12-Lead  . ECHOCARDIOGRAM COMPLETE   Meds ordered this encounter  Medications  . hydrALAZINE (APRESOLINE) 50 MG tablet    Sig: Take 1 tablet (50 mg total) by mouth 3 (three) times daily.    Dispense:  90 tablet     Refill:  2    Patient Instructions  Medication Instructions:  Your physician has recommended you make the following change in your medication:  1- CHANGE Hydralazine to 50 mg by mouth three times a day.  *If you need a refill on your cardiac medications before your next appointment, please call your pharmacy*  Lab Work: none If you have labs (blood work) drawn today and your tests are completely normal, you will receive your results only by: [name] MyChart Message (if you have MyChart) OR . A paper copy in the mail If you have any lab test that is abnormal or we need to change your treatment, we will call you to review the results.  Testing/Procedures: Your physician has requested that you have an echocardiogram. Echocardiography is a painless test that uses sound waves to create images of your heart. It provides your doctor with information about the size and shape of your heart and how well your heart's chambers and valves are working. This procedure takes approximately one hour. There are no restrictions for this procedure. You may get an IV, if needed, to receive an ultrasound enhancing agent through to better visualize your heart.    Follow-Up: At [facility], you and your health needs are our priority.  As part of our continuing mission to provide you with exceptional heart care, we have created designated Provider Care Teams.  These Care Teams include your primary Cardiologist (physician) and Advanced Practice Providers (APPs -  Physician Assistants and Nurse Practitioners) who all work together to provide you with the care you need, when you need it.  Your next appointment:   2 week(s)  The format for your next appointment:   In Person  Provider:   Kate Sable, MD    Echocardiogram An echocardiogram is a procedure that uses painless sound waves (ultrasound) to produce an image of the heart. Images from an echocardiogram can provide important information about:  Signs  of coronary artery disease (CAD).  Aneurysm detection. An aneurysm is a weak or damaged part of an artery wall that bulges out from  the normal force of blood pumping through the body.  Heart size and shape. Changes in the size or shape of the heart can be associated with certain conditions, including heart failure, aneurysm, and CAD.  Heart muscle function.  Heart valve function.  Signs of a past heart attack.  Fluid buildup around the heart.  Thickening of the heart muscle.  A tumor or infectious growth around the heart valves. Tell a health care provider about:  Any allergies you have.  All medicines you are taking, including vitamins, herbs, eye drops, creams, and over-the-counter medicines.  Any blood disorders you have.  Any surgeries you have had.  Any medical conditions you have.  Whether you are pregnant or may be pregnant. What are the risks? Generally, this is a safe procedure. However, problems may occur, including:  Allergic reaction to dye (contrast) that may be used during the procedure. What happens before the procedure? No specific preparation is needed. You may eat and drink normally. What happens during the procedure?   An IV tube may be inserted into one of your veins.  You may receive contrast through this tube. A contrast is an injection that improves the quality of the pictures from your heart.  A gel will be applied to your chest.  A wand-like tool (transducer) will be moved over your chest. The gel will help to transmit the sound waves from the transducer.  The sound waves will harmlessly bounce off of your heart to allow the heart images to be captured in real-time motion. The images will be recorded on a computer. The procedure may vary among health care providers and hospitals. What happens after the procedure?  You may return to your normal, everyday life, including diet, activities, and medicines, unless your health care provider tells  you not to do that. Summary  An echocardiogram is a procedure that uses painless sound waves (ultrasound) to produce an image of the heart.  Images from an echocardiogram can provide important information about the size and shape of your heart, heart muscle function, heart valve function, and fluid buildup around your heart.  You do not need to do anything to prepare before this procedure. You may eat and drink normally.  After the echocardiogram is completed, you may return to your normal, everyday life, unless your health care provider tells you not to do that. This information is not intended to replace advice given to you by your health care provider. Make sure you discuss any questions you have with your health care provider. Document Released: 11/01/2000 Document Revised: 02/25/2019 Document Reviewed: 12/07/2016 Elsevier Patient Education  2020 [place], Luke Sable, MD  10/21/2019 9:54 AM    [place]

## 2019-10-21 NOTE — Patient Instructions (Signed)
Medication Instructions:  Your physician has recommended you make the following change in your medication:  1- CHANGE Hydralazine to 50 mg by mouth three times a day.  *If you need a refill on your cardiac medications before your next appointment, please call your pharmacy*  Lab Work: none If you have labs (blood work) drawn today and your tests are completely normal, you will receive your results only by: [name] MyChart Message (if you have MyChart) OR . A paper copy in the mail If you have any lab test that is abnormal or we need to change your treatment, we will call you to review the results.  Testing/Procedures: Your physician has requested that you have an echocardiogram. Echocardiography is a painless test that uses sound waves to create images of your heart. It provides your doctor with information about the size and shape of your heart and how well your heart's chambers and valves are working. This procedure takes approximately one hour. There are no restrictions for this procedure. You may get an IV, if needed, to receive an ultrasound enhancing agent through to better visualize your heart.    Follow-Up: At [facility], you and your health needs are our priority.  As part of our continuing mission to provide you with exceptional heart care, we have created designated Provider Care Teams.  These Care Teams include your primary Cardiologist (physician) and Advanced Practice Providers (APPs -  Physician Assistants and Nurse Practitioners) who all work together to provide you with the care you need, when you need it.  Your next appointment:   2 week(s)  The format for your next appointment:   In Person  Provider:   Kate Sable, MD    Echocardiogram An echocardiogram is a procedure that uses painless sound waves (ultrasound) to produce an image of the heart. Images from an echocardiogram can provide important information about:  Signs of coronary artery disease (CAD).   Aneurysm detection. An aneurysm is a weak or damaged part of an artery wall that bulges out from the normal force of blood pumping through the body.  Heart size and shape. Changes in the size or shape of the heart can be associated with certain conditions, including heart failure, aneurysm, and CAD.  Heart muscle function.  Heart valve function.  Signs of a past heart attack.  Fluid buildup around the heart.  Thickening of the heart muscle.  A tumor or infectious growth around the heart valves. Tell a health care provider about:  Any allergies you have.  All medicines you are taking, including vitamins, herbs, eye drops, creams, and over-the-counter medicines.  Any blood disorders you have.  Any surgeries you have had.  Any medical conditions you have.  Whether you are pregnant or may be pregnant. What are the risks? Generally, this is a safe procedure. However, problems may occur, including:  Allergic reaction to dye (contrast) that may be used during the procedure. What happens before the procedure? No specific preparation is needed. You may eat and drink normally. What happens during the procedure?   An IV tube may be inserted into one of your veins.  You may receive contrast through this tube. A contrast is an injection that improves the quality of the pictures from your heart.  A gel will be applied to your chest.  A wand-like tool (transducer) will be moved over your chest. The gel will help to transmit the sound waves from the transducer.  The sound waves will harmlessly bounce off of  your heart to allow the heart images to be captured in real-time motion. The images will be recorded on a computer. The procedure may vary among health care providers and hospitals. What happens after the procedure?  You may return to your normal, everyday life, including diet, activities, and medicines, unless your health care provider tells you not to do that. Summary  An  echocardiogram is a procedure that uses painless sound waves (ultrasound) to produce an image of the heart.  Images from an echocardiogram can provide important information about the size and shape of your heart, heart muscle function, heart valve function, and fluid buildup around your heart.  You do not need to do anything to prepare before this procedure. You may eat and drink normally.  After the echocardiogram is completed, you may return to your normal, everyday life, unless your health care provider tells you not to do that. This information is not intended to replace advice given to you by your health care provider. Make sure you discuss any questions you have with your health care provider. Document Released: 11/01/2000 Document Revised: 02/25/2019 Document Reviewed: 12/07/2016 Elsevier Patient Education  2020 [employer].

## 2019-10-22 ENCOUNTER — Encounter: Payer: Self-pay | Admitting: Nurse Practitioner

## 2019-10-22 ENCOUNTER — Ambulatory Visit (INDEPENDENT_AMBULATORY_CARE_PROVIDER_SITE_OTHER): Payer: Medicaid Other | Admitting: Nurse Practitioner

## 2019-10-22 DIAGNOSIS — F10288 Alcohol dependence with other alcohol-induced disorder: Secondary | ICD-10-CM

## 2019-10-22 DIAGNOSIS — I10 Essential (primary) hypertension: Secondary | ICD-10-CM

## 2019-10-22 DIAGNOSIS — F17219 Nicotine dependence, cigarettes, with unspecified nicotine-induced disorders: Secondary | ICD-10-CM

## 2019-10-22 NOTE — Assessment & Plan Note (Signed)
Has been cutting back, praised for this and recommended continue cut back.  He refuses rehab or therapy.  Is aware of effect of heavy alcohol use on overall health, especially BP.

## 2019-10-22 NOTE — Assessment & Plan Note (Signed)
I have recommended complete cessation of tobacco use. I have discussed various options available for assistance with tobacco cessation including over the counter methods (Nicotine gum, patch and lozenges). We also discussed prescription options (Chantix, Nicotine Inhaler / Nasal Spray). The patient is not interested in pursuing any prescription tobacco cessation options at this time.  

## 2019-10-22 NOTE — Patient Instructions (Signed)

## 2019-10-22 NOTE — Progress Notes (Addendum)
BP (!) 171/99    Subjective:    Patient ID: Luke Pollen., male    DOB: [birth year], 58 y.o.   MRN: [id number]  HPI: Luke Cimo. is a 58 y.o. male  Chief Complaint  Patient presents with  . Hypertension    pt states cardio increased hydralizine yesterday, states he feels really good today    . This visit was completed via telephone due to the restrictions of the COVID-19 pandemic. All issues as above were discussed and addressed but no physical exam was performed. If it was felt that the patient should be evaluated in the office, they were directed there. The patient verbally consented to this visit. Patient was unable to complete an audio/visual visit due to Lack of equipment. Due to the catastrophic nature of the COVID-19 pandemic, this visit was done through audio contact only. . Location of the patient: home . Location of the provider: work . Those involved with this call:  . Provider: Marnee Guarneri, DNP . CMA: [name], CMA . Front Desk/Registration: [name]  . Time spent on call: 15 minutes on the phone discussing health concerns. 10 minutes total spent in review of patient's record and preparation of their chart.   HYPERTENSION Saw cardiology yesterday and they increased Hydralazine 50 MG TID.  Reports he has decreased his alcohol intake, 6 beers in past 4 days, previously drinking 10-15 beers a day.  Reports "there is no sense sitting here and killing myself like this because I lost the one I loved, she would want me to keep on living".  Reports he is feeling a lot better, with less headaches, dizziness, and tinnitus since he started cutting back on alcohol intake.  Discussed at length with him importance of continued cutting back for overall health and BP control.  Continues to smoke 1/2 to 1 PPD.   He is scheduled to return to cardiology in 2 weeks and is to have echo performed. Hypertension status: uncontrolled  Satisfied with current  treatment? yes Duration of hypertension: chronic BP monitoring frequency:  daily BP range: 150-160/90's BP medication side effects:  no Medication compliance: good compliance Previous BP meds:Has had reactions and side effects to: Lisinopril, Amlodipine, HCTZ, Carvedilol Aspirin: no Recurrent headaches: no Visual changes: no Palpitations: no Dyspnea: no Chest pain: no Lower extremity edema: no Dizzy/lightheaded: no  Relevant past medical, surgical, family and social history reviewed and updated as indicated. Interim medical history since our last visit reviewed. Allergies and medications reviewed and updated.  Review of Systems  Constitutional: Negative for activity change, diaphoresis, fatigue and fever.  Respiratory: Negative for cough, chest tightness, shortness of breath and wheezing.   Cardiovascular: Negative for chest pain, palpitations and leg swelling.  Gastrointestinal: Negative for abdominal distention, abdominal pain, constipation, diarrhea, nausea and vomiting.  Skin: Negative.   Neurological: Negative for dizziness, syncope, weakness, light-headedness, numbness and headaches.  Psychiatric/Behavioral: Negative.     Per HPI unless specifically indicated above     Objective:    BP (!) 171/99   Wt Readings from Last 3 Encounters:  10/21/19 185 lb (83.9 kg)  10/07/19 180 lb (81.6 kg)  10/07/19 180 lb (81.6 kg)    Physical Exam   Unable to perform due to telephone visit only.  Results for orders placed or performed in visit on 10/11/19  CBC with Differential/Platelet out  Result Value Ref Range   WBC 10.0 3.4 - 10.8 x10E3/uL   RBC 5.53 4.14 - 5.80 x10E6/uL  Hemoglobin 17.0 13.0 - 17.7 g/dL   Hematocrit 50.8 37.5 - 51.0 %   MCV 92 79 - 97 fL   MCH 30.7 26.6 - 33.0 pg   MCHC 33.5 31.5 - 35.7 g/dL   RDW 12.4 11.6 - 15.4 %   Platelets 347 150 - 450 x10E3/uL   Neutrophils 71 Not Estab. %   Lymphs 18 Not Estab. %   Monocytes 9 Not Estab. %   Eos 1 Not  Estab. %   Basos 1 Not Estab. %   Neutrophils Absolute 7.1 (H) 1.4 - 7.0 x10E3/uL   Lymphocytes Absolute 1.8 0.7 - 3.1 x10E3/uL   Monocytes Absolute 0.9 0.1 - 0.9 x10E3/uL   EOS (ABSOLUTE) 0.1 0.0 - 0.4 x10E3/uL   Basophils Absolute 0.1 0.0 - 0.2 x10E3/uL   Immature Granulocytes 0 Not Estab. %   Immature Grans (Abs) 0.0 0.0 - 0.1 x10E3/uL  Comprehensive metabolic panel  Result Value Ref Range   Glucose 83 65 - 99 mg/dL   BUN 13 6 - 24 mg/dL   Creatinine, Ser 0.89 0.76 - 1.27 mg/dL   GFR calc non Af Amer 96 >59 mL/min/1.73   GFR calc Af Amer 110 >59 mL/min/1.73   BUN/Creatinine Ratio 15 9 - 20   Sodium 136 134 - 144 mmol/L   Potassium 3.4 (L) 3.5 - 5.2 mmol/L   Chloride 92 (L) 96 - 106 mmol/L   CO2 26 20 - 29 mmol/L   Calcium 9.1 8.7 - 10.2 mg/dL   Total Protein 7.4 6.0 - 8.5 g/dL   Albumin 4.3 3.8 - 4.9 g/dL   Globulin, Total 3.1 1.5 - 4.5 g/dL   Albumin/Globulin Ratio 1.4 1.2 - 2.2   Bilirubin Total 0.8 0.0 - 1.2 mg/dL   Alkaline Phosphatase 93 39 - 117 IU/L   AST 27 0 - 40 IU/L   ALT 22 0 - 44 IU/L      Assessment & Plan:   Problem List Items Addressed This Visit      Cardiovascular and Mediastinum   Uncontrolled hypertension    Chronic, ongoing with recent increase in Hydralazine to 50 MG TID, by cardiology.  He is working on cutting back on alcohol intake, praised for this and recommended continue cut back.  Will continue current regimen and collaboration with cardiology, appreciate their input.  Return in 4 weeks.        Nervous and Auditory   Nicotine dependence, cigarettes, w unsp disorders    I have recommended complete cessation of tobacco use. I have discussed various options available for assistance with tobacco cessation including over the counter methods (Nicotine gum, patch and lozenges). We also discussed prescription options (Chantix, Nicotine Inhaler / Nasal Spray). The patient is not interested in pursuing any prescription tobacco cessation options at  this time.         Other   Alcohol dependence ([place])    Has been cutting back, praised for this and recommended continue cut back.  He refuses rehab or therapy.  Is aware of effect of heavy alcohol use on overall health, especially BP.         I discussed the assessment and treatment plan with the patient. The patient was provided an opportunity to ask questions and all were answered. The patient agreed with the plan and demonstrated an understanding of the instructions.   The patient was advised to call back or seek an in-person evaluation if the symptoms worsen or if the condition fails to improve as  anticipated.   I provided 15 minutes of time during this encounter.  Follow up plan: Return in about 4 weeks (around 11/19/2019) for HTN, COPD, Chronic pain, GERD.

## 2019-10-22 NOTE — Assessment & Plan Note (Signed)
Chronic, ongoing with recent increase in Hydralazine to 50 MG TID, by cardiology.  He is working on cutting back on alcohol intake, praised for this and recommended continue cut back.  Will continue current regimen and collaboration with cardiology, appreciate their input.  Return in 4 weeks.

## 2019-10-27 ENCOUNTER — Ambulatory Visit: Payer: Self-pay | Admitting: *Deleted

## 2019-10-27 DIAGNOSIS — I10 Essential (primary) hypertension: Secondary | ICD-10-CM

## 2019-10-27 DIAGNOSIS — F10288 Alcohol dependence with other alcohol-induced disorder: Secondary | ICD-10-CM

## 2019-10-27 NOTE — Chronic Care Management (AMB) (Signed)
Chronic Care Management   Follow Up Note   10/27/2019 Name: Luke Briggs. MRN: [id number] DOB: Mar 30, 1961  Referred by: [name], NP Reason for referral : Chronic Care Management (HTN)   [name]. is a 58 y.o. year old male who is a primary care patient of [name], Barbaraann Faster, NP. The CCM team was consulted for assistance with chronic disease management and care coordination needs.    Review of patient status, including review of consultants reports, relevant laboratory and other test results, and collaboration with appropriate care team members and the patient's provider was performed as part of comprehensive patient evaluation and provision of chronic care management services.    SDOH (Social Determinants of Health) screening performed today: Alcohol/Substance Use. See Care Plan for related entries.   Outpatient Encounter Medications as of 10/27/2019  Medication Sig Note  . albuterol (VENTOLIN HFA) 108 (90 Base) MCG/ACT inhaler Inhale 2 puffs into the lungs every 6 (six) hours as needed for wheezing or shortness of breath.   . chlorthalidone (HYGROTON) 25 MG tablet Take 0.5 tablets (12.5 mg total) by mouth daily. If tolerating in one week, then start taking whole pill (25 MG). (Patient not taking: Reported on 10/19/2019)   . EPINEPHrine 0.3 mg/0.3 mL IJ SOAJ injection Inject 0.3 mLs (0.3 mg total) into the muscle as needed for anaphylaxis.   . finasteride (PROSCAR) 5 MG tablet Take 1 tablet (5 mg total) by mouth daily.   . hydrALAZINE (APRESOLINE) 50 MG tablet Take 1 tablet (50 mg total) by mouth 3 (three) times daily.   . hydrocortisone (ANUSOL-HC) 2.5 % rectal cream Place 1 application rectally 2 (two) times daily. (Patient taking differently: Place 1 application rectally 2 (two) times daily as needed. )   . Ipratropium-Albuterol (COMBIVENT) 20-100 MCG/ACT AERS respimat Inhale 1 puff into the lungs every 6 (six) hours. 08/17/2019: Using 1-2 times daily   .  montelukast (SINGULAIR) 10 MG tablet Take 10 mg by mouth at bedtime.   . Multiple Vitamin (MULTIVITAMIN WITH MINERALS) TABS tablet Take 1 tablet by mouth daily.   [name] omeprazole (PRILOSEC) 40 MG capsule Take 1 capsule (40 mg total) by mouth daily.   . Potassium 99 MG TABS Take 1 tablet by mouth daily.   . tamsulosin (FLOMAX) 0.4 MG CAPS capsule Take 1 capsule (0.4 mg total) by mouth daily.   [name] triamcinolone cream (KENALOG) 0.1 % Apply 1 application topically 2 (two) times daily.    No facility-administered encounter medications on file as of 10/27/2019.      Goals Addressed            This Visit's Progress   . RN-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 Chronic Disease Management support and education needs related to HTN, COPD, Chronic pain, Nicotine dependance and PTSD  Nurse Case Manager Clinical Goal(s):  [name] Over the next 120 days, patient will work with [facility] to address needs related to Multiple Chronic disease processes  Interventions:  . Evaluation of current treatment plan related to HTN Chronic Pain and COPD and patient's adherence to plan as established by provider. . Reviewed medications with patient and discussed cardiologist adding a beta blocker to patient's medication regimen. Patient stated he has not picked this up yet but plans to today. . Discussed plans with patient for ongoing care management follow up and provided patient with direct  contact information for care management team . Advised patient, providing education and rationale, to monitor blood pressure daily and record, calling PCP for findings outside established parameters.  . Patient reports he is continuing to monitor b/p and yesterday it was 161/103. [name] Discussed the importance of continuing to cut down on his alcohol consumption to assist with b/p control. Patient stated he had switched to light beer which has less  alcohol and is down to 5 beers per day.  . Encouraged patient to pick up new prescription today and to continue to monitor b/p   Patient Self Care Activities:  . Currently UNABLE TO independently manage multiple chronic conditions.  . Performs ADL's independently  Please see past updates related to this goal by clicking on the "Past Updates" button in the selected goal           The care management team will reach out to the patient again over the next 60 days.  The patient has been provided with contact information for the care management team and has been advised to call with any health related questions or concerns.    Merlene Morse Andreah Goheen RN, BSN Nurse Case Editor, [facility] Care Management  [phone]) Business Mobile

## 2019-11-03 ENCOUNTER — Other Ambulatory Visit: Payer: Self-pay | Admitting: Cardiology

## 2019-11-03 DIAGNOSIS — R0602 Shortness of breath: Secondary | ICD-10-CM

## 2019-11-04 ENCOUNTER — Ambulatory Visit: Payer: Medicaid Other | Admitting: Cardiology

## 2019-11-05 ENCOUNTER — Encounter: Payer: Self-pay | Admitting: Nurse Practitioner

## 2019-11-05 ENCOUNTER — Telehealth: Payer: Self-pay | Admitting: Nurse Practitioner

## 2019-11-05 NOTE — Telephone Encounter (Signed)
Application accepted from [place] made to Truddie Crumble and Propane for Luke Briggs.  Called pt to notify that the truck should be out to re-fill his propane tank before Christmas next week.  Closing referral pending any other needs of patient.  [place] . Embedded Care Coordination [place]  Care Management ??[email or web address]  ??[phone]       From: Burnett Harry [email or web address]>  Sent: Friday, November 05, 2019 3:17 PM To: Jill Alexanders [facility]) [email or web address]> Subject: [employer]  Hi, Just fyi, I called and gave Truddie Crumble my credit card.  They were questioning if his tank was one of their tanks.  But, I told them it was.    Hope you have a great weekend.   [place] Associate  Direct Dial: [phone]  Fax: [phone]  Website: [email or web address]  The purpose of the [facility] is to seek ways to further the Hospital's mission to improve the health of the citizens of [facility] and its surrounding communities.  Note my name will change in the system to [place] soon.  You can email me at [email or web address], but in the system lookup the name is under Burnett Harry until further notice.

## 2019-11-05 NOTE — Telephone Encounter (Signed)
    Called pt regarding Insurance account manager for [employer]. Pt lives in his trailer alone and is heating with electric fireplace/space heater. He has HBP and a cataract in left eye. Will submit app to [facility] and will mail info on [place] program taking applications on [phone]   [place] . Embedded Care Coordination [place]  Care Management ??[email or web address]  ??[phone]     Financial Energy Assistance

## 2019-11-05 NOTE — Telephone Encounter (Signed)
From: Jill Alexanders [facility])  Sent: Friday, November 05, 2019 2:41 PM To: Burnett Harry [email or web address]) [email or web address]> Subject: ECC Application - Luke Briggs - 11/05/19  Good Afternoon Jenny Reichmann,  Please see attached application for Luke Briggs. Could you please confirm once you hear back from the [place] like to give the pt a call back if possible today. [employer] [phone]  Thank you as always!!   [place] . Embedded Care Coordination [place]  Care Management ??[email or web address]  ??[phone]

## 2019-11-05 NOTE — Telephone Encounter (Signed)
°  November 05, 2019   Luke E Spradley Jr. [address] [place] [address]   Dear Luke Briggs,   Thank you for taking the time to speak with me on the phone today regarding community resources.  Enclosed is the application for the [employer], the applications is enclosed for assistance paying towards your heating and electric bill.   Please call me at the number listed below if you have any follow-up questions or need help completing the application.  Thank you,   [place] Management ??[email or web address]   ??[phone]

## 2019-11-15 ENCOUNTER — Ambulatory Visit: Payer: Self-pay | Admitting: Licensed Clinical Social Worker

## 2019-11-15 DIAGNOSIS — F209 Schizophrenia, unspecified: Secondary | ICD-10-CM

## 2019-11-15 DIAGNOSIS — F4312 Post-traumatic stress disorder, chronic: Secondary | ICD-10-CM

## 2019-11-15 NOTE — Chronic Care Management (AMB) (Signed)
Care Management   Follow Up Note   11/15/2019 Name: Luke Briggs. MRN: [id number] DOB: 02-18-1961  Referred by: [name], NP Reason for referral : Care Coordination   [name]. is a 58 y.o. year old male who is a primary care patient of [name], Barbaraann Faster, NP. The care management team was consulted for assistance with care management and care coordination needs.    Review of patient status, including review of consultants reports, relevant laboratory and other test results, and collaboration with appropriate care team members and the patient's provider was performed as part of comprehensive patient evaluation and provision of chronic care management services.    SDOH (Social Determinants of Health) screening performed today: Financial Strain . See Care Plan for related entries.   Advanced Directives: See Care Plan and [place] application for related entries.   Goals Addressed    . SW- "I need more support right now." (pt-stated)       Current Barriers:  . Financial constraints related to managing health care expenses for self and manage finances within family and household.  . Limited access to food . Mental Health Concerns  . Social Isolation . Transportation concerns  Clinical Social Work Clinical Goal(s):  [name] Over the next 90 days, client will work with SW to address concerns related to "managing my mental health and financial situation"  Interventions: . Patient interviewed and appropriate assessments performed . Patient was referred to [facility] in [place] for ongoing schizophrenia + PTSD.  Lengthy psychiatric history and would benefit from more intense therapy. Patient states that he is not interested in mental health support at this time. He has denied help in he past as well. . Provided mental health counseling with regard to  PTSD and schizophrenia. LCSW used active and reflective listening and implemented appropriate  interventions to help suppport patient and his emotional needs. Advised patient to implement deep breathing/grounding/meditation/self-care exercises into his daily routine to combat stressors when they arise. Education provided.  . Provided patient with information about  . Discussed plans with patient for ongoing care management follow up and provided patient with direct contact information for care management team . Advised patient to contact CCM Program for any urgent case management needs. . Collaborated with [facility] (community agency) during last outreach re: referral for [employer]. Patient has medicaid and needs directions for Medicaid transportation. CCM RNCM has already mailed him a [place] transit brochure too. Patient is also interested in financial assistance support. He is worried about how he will be able to afford propane for his trailer (lives  with family/kids) in order to have heat this winter. He was asking for  general financial assistance resources that could help him with  affording bills. Referral now complete. Patient denies any current financial stressors now. . Assisted patient/caregiver with obtaining information about health plan benefits . Provided education and assistance to client regarding Advanced Directives. . Referred patient to [facility] for long term mental health follow up and therapy/counseling/medication management but patient declined. . Patient reports still needing transportation services. Patient was advised to contact his Medicaid caseworker today and get enrolled in services. Patient agreeable to do so. Number provided.   Patient Self Care Activities:  . Calls provider office for new concerns or questions . Does not know how to use Medicaid Transportation   Please see past updates related to this goal by clicking on the "Past Updates" button in the selected goal  The patient has been provided with contact information for the  care management team and has been advised to call with any health related questions or concerns.   Eula Fried, BSW, MSW, [facility] Care Management [email or web address] Phone: [phone]

## 2019-11-18 ENCOUNTER — Ambulatory Visit (INDEPENDENT_AMBULATORY_CARE_PROVIDER_SITE_OTHER): Payer: Medicaid Other

## 2019-11-18 ENCOUNTER — Other Ambulatory Visit: Payer: Self-pay

## 2019-11-18 DIAGNOSIS — R0602 Shortness of breath: Secondary | ICD-10-CM | POA: Diagnosis not present

## 2019-11-24 ENCOUNTER — Other Ambulatory Visit: Payer: Self-pay

## 2019-11-24 ENCOUNTER — Encounter: Payer: Self-pay | Admitting: Nurse Practitioner

## 2019-11-24 ENCOUNTER — Ambulatory Visit (INDEPENDENT_AMBULATORY_CARE_PROVIDER_SITE_OTHER): Payer: Medicaid Other | Admitting: Nurse Practitioner

## 2019-11-24 DIAGNOSIS — F17219 Nicotine dependence, cigarettes, with unspecified nicotine-induced disorders: Secondary | ICD-10-CM | POA: Diagnosis not present

## 2019-11-24 DIAGNOSIS — F10288 Alcohol dependence with other alcohol-induced disorder: Secondary | ICD-10-CM | POA: Diagnosis not present

## 2019-11-24 DIAGNOSIS — I10 Essential (primary) hypertension: Secondary | ICD-10-CM

## 2019-11-24 MED ORDER — FINASTERIDE 5 MG PO TABS: 5.0000 mg | ORAL_TABLET | Freq: Every day | ORAL | 2 refills | Status: DC

## 2019-11-24 MED ORDER — TAMSULOSIN HCL 0.4 MG PO CAPS: 0.4000 mg | ORAL_CAPSULE | Freq: Every day | ORAL | 2 refills | Status: DC

## 2019-11-24 MED ORDER — OMEPRAZOLE 40 MG PO CPDR: 40.0000 mg | DELAYED_RELEASE_CAPSULE | Freq: Every day | ORAL | 2 refills | Status: DC

## 2019-11-24 NOTE — Patient Instructions (Signed)
DASH Eating Plan DASH stands for "Dietary Approaches to Stop Hypertension." The DASH eating plan is a healthy eating plan that has been shown to reduce high blood pressure (hypertension). It may also reduce your risk for type 2 diabetes, heart disease, and stroke. The DASH eating plan may also help with weight loss. What are tips for following this plan?  General guidelines  Avoid eating more than 2,300 mg (milligrams) of salt (sodium) a day. If you have hypertension, you may need to reduce your sodium intake to 1,500 mg a day.  Limit alcohol intake to no more than 1 drink a day for nonpregnant women and 2 drinks a day for men. One drink equals 12 oz of beer, 5 oz of wine, or 1 oz of hard liquor.  Work with your health care provider to maintain a healthy body weight or to lose weight. Ask what an ideal weight is for you.  Get at least 30 minutes of exercise that causes your heart to beat faster (aerobic exercise) most days of the week. Activities may include walking, swimming, or biking.  Work with your health care provider or diet and nutrition specialist (dietitian) to adjust your eating plan to your individual calorie needs. Reading food labels   Check food labels for the amount of sodium per serving. Choose foods with less than 5 percent of the Daily Value of sodium. Generally, foods with less than 300 mg of sodium per serving fit into this eating plan.  To find whole grains, look for the word "whole" as the first word in the ingredient list. Shopping  Buy products labeled as "low-sodium" or "no salt added."  Buy fresh foods. Avoid canned foods and premade or frozen meals. Cooking  Avoid adding salt when cooking. Use salt-free seasonings or herbs instead of table salt or sea salt. Check with your health care provider or pharmacist before using salt substitutes.  Do not fry foods. Cook foods using healthy methods such as baking, boiling, grilling, and broiling instead.  Cook with  heart-healthy oils, such as olive, canola, soybean, or sunflower oil. Meal planning  Eat a balanced diet that includes: ? 5 or more servings of fruits and vegetables each day. At each meal, try to fill half of your plate with fruits and vegetables. ? Up to 6-8 servings of whole grains each day. ? Less than 6 oz of lean meat, poultry, or fish each day. A 3-oz serving of meat is about the same size as a deck of cards. One egg equals 1 oz. ? 2 servings of low-fat dairy each day. ? A serving of nuts, seeds, or beans 5 times each week. ? Heart-healthy fats. Healthy fats called Omega-3 fatty acids are found in foods such as flaxseeds and coldwater fish, like sardines, salmon, and mackerel.  Limit how much you eat of the following: ? Canned or prepackaged foods. ? Food that is high in trans fat, such as fried foods. ? Food that is high in saturated fat, such as fatty meat. ? Sweets, desserts, sugary drinks, and other foods with added sugar. ? Full-fat dairy products.  Do not salt foods before eating.  Try to eat at least 2 vegetarian meals each week.  Eat more home-cooked food and less restaurant, buffet, and fast food.  When eating at a restaurant, ask that your food be prepared with less salt or no salt, if possible. What foods are recommended? The items listed may not be a complete list. Talk with your dietitian about   what dietary choices are best for you. Grains Whole-grain or whole-wheat bread. Whole-grain or whole-wheat pasta. Brown rice. Oatmeal. Quinoa. Bulgur. Whole-grain and low-sodium cereals. Pita bread. Low-fat, low-sodium crackers. Whole-wheat flour tortillas. Vegetables Fresh or frozen vegetables (raw, steamed, roasted, or grilled). Low-sodium or reduced-sodium tomato and vegetable juice. Low-sodium or reduced-sodium tomato sauce and tomato paste. Low-sodium or reduced-sodium canned vegetables. Fruits All fresh, dried, or frozen fruit. Canned fruit in natural juice (without  added sugar). Meat and other protein foods Skinless chicken or turkey. Ground chicken or turkey. Pork with fat trimmed off. Fish and seafood. Egg whites. Dried beans, peas, or lentils. Unsalted nuts, nut butters, and seeds. Unsalted canned beans. Lean cuts of beef with fat trimmed off. Low-sodium, lean deli meat. Dairy Low-fat (1%) or fat-free (skim) milk. Fat-free, low-fat, or reduced-fat cheeses. Nonfat, low-sodium ricotta or cottage cheese. Low-fat or nonfat yogurt. Low-fat, low-sodium cheese. Fats and oils Soft margarine without trans fats. Vegetable oil. Low-fat, reduced-fat, or light mayonnaise and salad dressings (reduced-sodium). Canola, safflower, olive, soybean, and sunflower oils. Avocado. Seasoning and other foods Herbs. Spices. Seasoning mixes without salt. Unsalted popcorn and pretzels. Fat-free sweets. What foods are not recommended? The items listed may not be a complete list. Talk with your dietitian about what dietary choices are best for you. Grains Baked goods made with fat, such as croissants, muffins, or some breads. Dry pasta or rice meal packs. Vegetables Creamed or fried vegetables. Vegetables in a cheese sauce. Regular canned vegetables (not low-sodium or reduced-sodium). Regular canned tomato sauce and paste (not low-sodium or reduced-sodium). Regular tomato and vegetable juice (not low-sodium or reduced-sodium). Pickles. Olives. Fruits Canned fruit in a light or heavy syrup. Fried fruit. Fruit in cream or butter sauce. Meat and other protein foods Fatty cuts of meat. Ribs. Fried meat. Bacon. Sausage. Bologna and other processed lunch meats. Salami. Fatback. Hotdogs. Bratwurst. Salted nuts and seeds. Canned beans with added salt. Canned or smoked fish. Whole eggs or egg yolks. Chicken or turkey with skin. Dairy Whole or 2% milk, cream, and half-and-half. Whole or full-fat cream cheese. Whole-fat or sweetened yogurt. Full-fat cheese. Nondairy creamers. Whipped toppings.  Processed cheese and cheese spreads. Fats and oils Butter. Stick margarine. Lard. Shortening. Ghee. Bacon fat. Tropical oils, such as coconut, palm kernel, or palm oil. Seasoning and other foods Salted popcorn and pretzels. Onion salt, garlic salt, seasoned salt, table salt, and sea salt. Worcestershire sauce. Tartar sauce. Barbecue sauce. Teriyaki sauce. Soy sauce, including reduced-sodium. Steak sauce. Canned and packaged gravies. Fish sauce. Oyster sauce. Cocktail sauce. Horseradish that you find on the shelf. Ketchup. Mustard. Meat flavorings and tenderizers. Bouillon cubes. Hot sauce and Tabasco sauce. Premade or packaged marinades. Premade or packaged taco seasonings. Relishes. Regular salad dressings. Where to find more information:  National Heart, Lung, and Blood Institute: www.nhlbi.nih.gov  American Heart Association: www.heart.org Summary  The DASH eating plan is a healthy eating plan that has been shown to reduce high blood pressure (hypertension). It may also reduce your risk for type 2 diabetes, heart disease, and stroke.  With the DASH eating plan, you should limit salt (sodium) intake to 2,300 mg a day. If you have hypertension, you may need to reduce your sodium intake to 1,500 mg a day.  When on the DASH eating plan, aim to eat more fresh fruits and vegetables, whole grains, lean proteins, low-fat dairy, and heart-healthy fats.  Work with your health care provider or diet and nutrition specialist (dietitian) to adjust your eating plan to your   individual calorie needs. This information is not intended to replace advice given to you by your health care provider. Make sure you discuss any questions you have with your health care provider. Document Revised: 10/17/2017 Document Reviewed: 10/28/2016 Elsevier Patient Education  2020 Elsevier Inc.  

## 2019-11-24 NOTE — Progress Notes (Signed)
There were no vitals taken for this visit.   Subjective:    Patient ID: Luke Briggs., male    DOB: 1960-11-30, 59 y.o.   MRN: [id number]  HPI: Luke Briggs. is a 59 y.o. male  Chief Complaint  Patient presents with  . Follow-up    4 week    . This visit was completed via telephone due to the restrictions of the COVID-19 pandemic. All issues as above were discussed and addressed but no physical exam was performed. If it was felt that the patient should be evaluated in the office, they were directed there. The patient verbally consented to this visit. Patient was unable to complete an audio/visual visit due to Lack of equipment. Due to the catastrophic nature of the COVID-19 pandemic, this visit was done through audio contact only. . Location of the patient: home . Location of the provider: work . Those involved with this call:  . Provider: Marnee Guarneri, DNP . CMA: [name], CMA . Front Desk/Registration: Don Perking  . Time spent on call: 15 minutes on the phone discussing health concerns. 10 minutes total spent in review of patient's record and preparation of their chart.  . I verified patient identity using two factors (patient name and date of birth). Patient consents verbally to being seen via telemedicine visit today.    HYPERTENSION Continues on Hydralazine 50 MG TID, [place], K+. Reports he has decreased his alcohol intake, 3-4 light beer days, previously drinking 10-15 beers a day. Reports he is feeling a lot better, with less headaches, dizziness, and tinnitus since he started cutting back on alcohol intake.  Continues to smoke 1/2 to 1 PPD.   Had echo performed 11/18/19 -- EF 60-65% and no left ventricular hypertrophy. Currently reports his BP cuff is not working.  Returns to see cardiology on 11/29/2019.  He asked about Viagra, discussed at this time until BP controlled do not feel comfortable prescribing ED medication. Hypertension status:  stable  Satisfied with current treatment? yes Duration of hypertension: chronic BP monitoring frequency:  not checking BP range:  BP medication side effects:  no Medication compliance: good compliance Previous BP meds:has tried multiple medications and had ADR  Aspirin: no Recurrent headaches: no Visual changes: no Palpitations: no Dyspnea: no Chest pain: no Lower extremity edema: no Dizzy/lightheaded: no  Relevant past medical, surgical, family and social history reviewed and updated as indicated. Interim medical history since our last visit reviewed. Allergies and medications reviewed and updated.  Review of Systems  Constitutional: Negative for activity change, diaphoresis, fatigue and fever.  Respiratory: Negative for cough, chest tightness, shortness of breath and wheezing.   Cardiovascular: Negative for chest pain, palpitations and leg swelling.  Neurological: Negative.   Psychiatric/Behavioral: Negative.     Per HPI unless specifically indicated above     Objective:    There were no vitals taken for this visit.  Wt Readings from Last 3 Encounters:  10/21/19 185 lb (83.9 kg)  10/07/19 180 lb (81.6 kg)  10/07/19 180 lb (81.6 kg)    Physical Exam   Unable to perform due to no visual access.  Results for orders placed or performed in visit on 10/11/19  CBC with Differential/Platelet out  Result Value Ref Range   WBC 10.0 3.4 - 10.8 x10E3/uL   RBC 5.53 4.14 - 5.80 x10E6/uL   Hemoglobin 17.0 13.0 - 17.7 g/dL   Hematocrit 50.8 37.5 - 51.0 %   MCV 92 79 - 97  fL   MCH 30.7 26.6 - 33.0 pg   MCHC 33.5 31.5 - 35.7 g/dL   RDW 12.4 11.6 - 15.4 %   Platelets 347 150 - 450 x10E3/uL   Neutrophils 71 Not Estab. %   Lymphs 18 Not Estab. %   Monocytes 9 Not Estab. %   Eos 1 Not Estab. %   Basos 1 Not Estab. %   Neutrophils Absolute 7.1 (H) 1.4 - 7.0 x10E3/uL   Lymphocytes Absolute 1.8 0.7 - 3.1 x10E3/uL   Monocytes Absolute 0.9 0.1 - 0.9 x10E3/uL   EOS (ABSOLUTE) 0.1  0.0 - 0.4 x10E3/uL   Basophils Absolute 0.1 0.0 - 0.2 x10E3/uL   Immature Granulocytes 0 Not Estab. %   Immature Grans (Abs) 0.0 0.0 - 0.1 x10E3/uL  Comprehensive metabolic panel  Result Value Ref Range   Glucose 83 65 - 99 mg/dL   BUN 13 6 - 24 mg/dL   Creatinine, Ser 0.89 0.76 - 1.27 mg/dL   GFR calc non Af Amer 96 >59 mL/min/1.73   GFR calc Af Amer 110 >59 mL/min/1.73   BUN/Creatinine Ratio 15 9 - 20   Sodium 136 134 - 144 mmol/L   Potassium 3.4 (L) 3.5 - 5.2 mmol/L   Chloride 92 (L) 96 - 106 mmol/L   CO2 26 20 - 29 mmol/L   Calcium 9.1 8.7 - 10.2 mg/dL   Total Protein 7.4 6.0 - 8.5 g/dL   Albumin 4.3 3.8 - 4.9 g/dL   Globulin, Total 3.1 1.5 - 4.5 g/dL   Albumin/Globulin Ratio 1.4 1.2 - 2.2   Bilirubin Total 0.8 0.0 - 1.2 mg/dL   Alkaline Phosphatase 93 39 - 117 IU/L   AST 27 0 - 40 IU/L   ALT 22 0 - 44 IU/L      Assessment & Plan:   Problem List Items Addressed This Visit      Cardiovascular and Mediastinum   Uncontrolled hypertension    Chronic, ongoing.  Not checking BP at this time due to broken cuff.  Will review upcoming cardiology note for BP and medication changes.  Work with CCM team on obtaining new BP cuff for patient.  Continue current medication regimen at this time and continue cutting back on alcohol and smoking.  Will avoid ED medications at this time time due to uncontrolled BP.  Return in 4 weeks for face to face.        Nervous and Auditory   Nicotine dependence, cigarettes, w unsp disorders    I have recommended complete cessation of tobacco use. I have discussed various options available for assistance with tobacco cessation including over the counter methods (Nicotine gum, patch and lozenges). We also discussed prescription options (Chantix, Nicotine Inhaler / Nasal Spray). The patient is not interested in pursuing any prescription tobacco cessation options at this time.        Other   Alcohol dependence ([place]) - Primary    Has been cutting back,  praised for this and recommended continue cut back.  He refuses rehab or therapy.  Is aware of effect of heavy alcohol use on overall health, especially BP.         I discussed the assessment and treatment plan with the patient. The patient was provided an opportunity to ask questions and all were answered. The patient agreed with the plan and demonstrated an understanding of the instructions.   The patient was advised to call back or seek an in-person evaluation if the symptoms worsen or if  the condition fails to improve as anticipated.   I provided 15 minutes of time during this encounter.  Follow up plan: Return in about 4 weeks (around 12/22/2019) for HTN, COPD, Chronic pain, HLD, ED.

## 2019-11-24 NOTE — Assessment & Plan Note (Signed)
I have recommended complete cessation of tobacco use. I have discussed various options available for assistance with tobacco cessation including over the counter methods (Nicotine gum, patch and lozenges). We also discussed prescription options (Chantix, Nicotine Inhaler / Nasal Spray). The patient is not interested in pursuing any prescription tobacco cessation options at this time.  

## 2019-11-24 NOTE — Assessment & Plan Note (Signed)
Has been cutting back, praised for this and recommended continue cut back.  He refuses rehab or therapy.  Is aware of effect of heavy alcohol use on overall health, especially BP.

## 2019-11-24 NOTE — Assessment & Plan Note (Signed)
Chronic, ongoing.  Not checking BP at this time due to broken cuff.  Will review upcoming cardiology note for BP and medication changes.  Work with CCM team on obtaining new BP cuff for patient.  Continue current medication regimen at this time and continue cutting back on alcohol and smoking.  Will avoid ED medications at this time time due to uncontrolled BP.  Return in 4 weeks for face to face.

## 2019-11-25 ENCOUNTER — Telehealth: Payer: Self-pay

## 2019-11-25 NOTE — Telephone Encounter (Signed)
Called and spoke with patient, informed him that all providers were gone for today and that I would send this message to [facility] for tomorrow.  Copied from [place] [id number]. Topic: General - Inquiry >> Nov 25, 2019 11:52 AM Luke Briggs, NT wrote: Reason for CRM: Pt called in stating he believes he has a bladder infection and would like medicine sent in ASAP. Please advise. >> Nov 25, 2019  3:37 PM [name] wrote: Pt called back saying he has not heard anything and is still having lower abdominal pain.  He is feeling like he has to urinate but not much comes out.    CB#  [phone]

## 2019-11-25 NOTE — Telephone Encounter (Signed)
Please let patient know we have no lab open on 11/26/2019 and to ensure proper treatment he would benefit from urine testing.  That way we can ensure he gets correct antibiotic treatment.  He may benefit from visit with urgent care on 11/26/2019.  I could see him virtually today, but since I can not get urine sample, if I ordered antibiotic I could not guarantee it would work.  Let me know what he would like to do.

## 2019-11-26 NOTE — Telephone Encounter (Signed)
Patient states that he is getting better and that he will be fine.

## 2019-11-29 ENCOUNTER — Ambulatory Visit (INDEPENDENT_AMBULATORY_CARE_PROVIDER_SITE_OTHER): Payer: Medicaid Other | Admitting: Cardiology

## 2019-11-29 ENCOUNTER — Other Ambulatory Visit: Payer: Self-pay

## 2019-11-29 ENCOUNTER — Encounter: Payer: Self-pay | Admitting: Cardiology

## 2019-11-29 VITALS — BP 150/100 | HR 79 | Ht 70.0 in | Wt 183.5 lb

## 2019-11-29 DIAGNOSIS — F1010 Alcohol abuse, uncomplicated: Secondary | ICD-10-CM | POA: Diagnosis not present

## 2019-11-29 DIAGNOSIS — I10 Essential (primary) hypertension: Secondary | ICD-10-CM

## 2019-11-29 DIAGNOSIS — F17200 Nicotine dependence, unspecified, uncomplicated: Secondary | ICD-10-CM | POA: Diagnosis not present

## 2019-11-29 MED ORDER — HYDRALAZINE HCL 100 MG PO TABS: 100.0000 mg | ORAL_TABLET | Freq: Three times a day (TID) | ORAL | 3 refills | Status: DC

## 2019-11-29 MED ORDER — CARVEDILOL 6.25 MG PO TABS: 6.2500 mg | ORAL_TABLET | Freq: Two times a day (BID) | ORAL | 3 refills | Status: DC

## 2019-11-29 NOTE — Patient Instructions (Addendum)
Medication Instructions:  Your physician has recommended you make the following change in your medication:  1. Refill sent in for Carvedilol 6.25 mg twice a day 2. INCREASE Hydralazine 100 mg three times a day  *If you need a refill on your cardiac medications before your next appointment, please call your pharmacy*  Lab Work: None  If you have labs (blood work) drawn today and your tests are completely normal, you will receive your results only by: [name] MyChart Message (if you have MyChart) OR . A paper copy in the mail If you have any lab test that is abnormal or we need to change your treatment, we will call you to review the results.  Testing/Procedures: None   Follow-Up: At [facility], you and your health needs are our priority.  As part of our continuing mission to provide you with exceptional heart care, we have created designated Provider Care Teams.  These Care Teams include your primary Cardiologist (physician) and Advanced Practice Providers (APPs -  Physician Assistants and Nurse Practitioners) who all work together to provide you with the care you need, when you need it.  Your next appointment:   1 month(s)  The format for your next appointment:   In Person  Provider:    You may see Kate Sable, MD or one of the following Advanced Practice Providers on your designated Care Team:    Murray Hodgkins, NP  Christell Faith, PA-C  Marrianne Mood, PA-C     How to Take Your Blood Pressure You can take your blood pressure at home with a machine. You may need to check your blood pressure at home:  To check if you have high blood pressure (hypertension).  To check your blood pressure over time.  To make sure your blood pressure medicine is working. Supplies needed: You will need a blood pressure machine, or monitor. You can buy one at a drugstore or online. When choosing one:  Choose one with an arm cuff.  Choose one that wraps around your upper arm. Only  one finger should fit between your arm and the cuff.  Do not choose one that measures your blood pressure from your wrist or finger. Your doctor can suggest a monitor. How to prepare Avoid these things for 30 minutes before checking your blood pressure:  Drinking caffeine.  Drinking alcohol.  Eating.  Smoking.  Exercising. Five minutes before checking your blood pressure:  Pee.  Sit in a dining chair. Avoid sitting in a soft couch or armchair.  Be quiet. Do not talk. How to take your blood pressure Follow the instructions that came with your machine. If you have a digital blood pressure monitor, these may be the instructions: 1. Sit up straight. 2. Place your feet on the floor. Do not cross your ankles or legs. 3. Rest your left arm at the level of your heart. You may rest it on a table, desk, or chair. 4. Pull up your shirt sleeve. 5. Wrap the blood pressure cuff around the upper part of your left arm. The cuff should be 1 inch (2.5 cm) above your elbow. It is best to wrap the cuff around bare skin. 6. Fit the cuff snugly around your arm. You should be able to place only one finger between the cuff and your arm. 7. Put the cord inside the groove of your elbow. 8. Press the power button. 9. Sit quietly while the cuff fills with air and loses air. 10. Write down the numbers on  the screen. 11. Wait 2-3 minutes and then repeat steps 1-10. What do the numbers mean? Two numbers make up your blood pressure. The first number is called systolic pressure. The second is called diastolic pressure. An example of a blood pressure reading is "120 over 80" (or 120/80). If you are an adult and do not have a medical condition, use this guide to find out if your blood pressure is normal: Normal  First number: below 120.  Second number: below 80. Elevated  First number: 120-129.  Second number: below 80. Hypertension stage 1  First number: 130-139.  Second number:  80-89. Hypertension stage 2  First number: 140 or above.  Second number: 67 or above. Your blood pressure is above normal even if only the top or bottom number is above normal. Follow these instructions at home:  Check your blood pressure as often as your doctor tells you to.  Take your monitor to your next doctor's appointment. Your doctor will: ? Make sure you are using it correctly. ? Make sure it is working right.  Make sure you understand what your blood pressure numbers should be.  Tell your doctor if your medicines are causing side effects. Contact a doctor if:  Your blood pressure keeps being high. Get help right away if:  Your first blood pressure number is higher than 180.  Your second blood pressure number is higher than 120. This information is not intended to replace advice given to you by your health care provider. Make sure you discuss any questions you have with your health care provider. Document Revised: 10/17/2017 Document Reviewed: 04/12/2016 Elsevier Patient Education  2020 [employer].

## 2019-11-29 NOTE — Progress Notes (Signed)
Cardiology Office Note:    Date:  11/29/2019   ID:  Luke Briggs., DOB November 14, 1961, MRN [id number]  PCP:  [name], NP  Cardiologist:  Kate Sable, MD  Electrophysiologist:  None   Referring MD: [name], NP   Chief Complaint  Patient presents with  . office visit    F/U after echo-Patient reports tinnitus-concerns about elevated BP; Meds verbally reviewed with patient.    History of Present Illness:    Dat Bjorkman. is a 59 y.o. 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  He was originally seen due to elevated blood pressures.    Patient has  poorly controlled blood pressures for years.  He has tried several medications but stopped due to adverse effects.  Lisinopril caused angioedema, carvedilol caused nausea/vomiting, amlodipine caused severe dizziness.  He has been drinking heavily for the past 18 months after losing his fiance.  He drinks 10-15 beers daily.  He states he has been trying to cut back.  He also is a current smoker for over 40 years.  Patient has also tried chlorthalidone but was found to have hyponatremia and hypokalemia.  At the time, he was seen in emergency room for vomiting.  Blood pressure was noted to be elevated at 170/98.  He was started on hydralazine.  He seems to be tolerating the hydralazine okay with no adverse effects.   After last visit, echocardiogram was ordered to evaluate endorgan damage due to hypertension.  Hydralazine was increased to 50 mg 3 times daily.  Patient states he has decreased his drinking and is working on smoking.  He was started on Coreg 6.25 twice daily, has been taking for roughly 3 weeks now.  He has no side effects from the beta-blocker.  Past Medical History:  Diagnosis Date  . Angioedema 08/07/2019  . Arthritis   . COPD (chronic obstructive pulmonary disease) ([place])   . GERD  (gastroesophageal reflux disease)   . Hemorrhoids   . Hypertension   . Prostate disorder   . PTSD (post-traumatic stress disorder)   . Schizophrenia ([place])    Per patient's report.    Past Surgical History:  Procedure Laterality Date  . EYE SURGERY Left     Current Medications: Current Meds  Medication Sig  . albuterol (VENTOLIN HFA) 108 (90 Base) MCG/ACT inhaler Inhale 2 puffs into the lungs every 6 (six) hours as needed for wheezing or shortness of breath.  . carvedilol (COREG) 6.25 MG tablet Take 1 tablet (6.25 mg total) by mouth 2 (two) times daily with a meal.  . chlorthalidone (HYGROTON) 25 MG tablet Take 0.5 tablets (12.5 mg total) by mouth daily. If tolerating in one week, then start taking whole pill (25 MG).  [name] EPINEPHrine 0.3 mg/0.3 mL IJ SOAJ injection Inject 0.3 mLs (0.3 mg total) into the muscle as needed for anaphylaxis.  . finasteride (PROSCAR) 5 MG tablet Take 1 tablet (5 mg total) by mouth daily.  . hydrocortisone (ANUSOL-HC) 2.5 % rectal cream Place 1 application rectally 2 (two) times daily. (Patient taking differently: Place 1 application rectally 2 (two) times daily as needed. )  . Ipratropium-Albuterol (COMBIVENT) 20-100 MCG/ACT AERS respimat Inhale 1 puff into the lungs every 6 (six) hours.  . montelukast (SINGULAIR) 10 MG tablet Take 10 mg by mouth at bedtime.  . Multiple Vitamin (MULTIVITAMIN WITH MINERALS) TABS tablet Take 1 tablet  by mouth daily.  [name] omeprazole (PRILOSEC) 40 MG capsule Take 1 capsule (40 mg total) by mouth daily.  [name] oxyCODONE ER (XTAMPZA ER) 13.5 MG C12A Take 1 capsule by mouth 2 (two) times daily.  . Potassium 99 MG TABS Take 1 tablet by mouth daily.  . tamsulosin (FLOMAX) 0.4 MG CAPS capsule Take 1 capsule (0.4 mg total) by mouth daily.  [name] triamcinolone cream (KENALOG) 0.1 % Apply 1 application topically 2 (two) times daily.  . [DISCONTINUED] carvedilol (COREG) 6.25 MG tablet Take 6.25 mg by mouth 2 (two) times daily with a meal.  .  [DISCONTINUED] hydrALAZINE (APRESOLINE) 50 MG tablet Take 1 tablet (50 mg total) by mouth 3 (three) times daily.     Allergies:   Lisinopril, Amlodipine, and Penicillins   Social History   Socioeconomic History  . Marital status: Significant Other    Spouse name: Not on file  . Number of children: Not on file  . Years of education: Not on file  . Highest education level: Not on file  Occupational History  . Not on file  Tobacco Use  . Smoking status: Current Every Day Smoker    Packs/day: 1.00    Years: 43.00    Pack years: 43.00    Types: Cigarettes  . Smokeless tobacco: Never Used  . Tobacco comment: .75 ppd now  Substance and Sexual Activity  . Alcohol use: Yes    Comment: beer on occasion  . Drug use: Never  . Sexual activity: Not on file  Other Topics Concern  . Not on file  Social History Narrative   Patient has new girlfriend they have been dating almost a year.    Social Determinants of Health   Financial Resource Strain: Low Risk   . Difficulty of Paying Living Expenses: Not very hard  Food Insecurity: No Food Insecurity  . Worried About Charity fundraiser in the Last Year: Never true  . Ran Out of Food in the Last Year: Never true  Transportation Needs: Unmet Transportation Needs  . Lack of Transportation (Medical): Yes  . Lack of Transportation (Non-Medical): Yes  Physical Activity: Inactive  . Days of Exercise per Week: 0 days  . Minutes of Exercise per Session: 0 min  Stress:   . Feeling of Stress : Not on file  Social Connections: Unknown  . Frequency of Communication with Friends and Family: More than three times a week  . Frequency of Social Gatherings with Friends and Family: Not on file  . Attends Religious Services: Not on file  . Active Member of Clubs or Organizations: Not on file  . Attends Archivist Meetings: Not on file  . Marital Status: Living with partner     Family History: The patient's family history includes  Emphysema in his father and maternal grandmother; Heart disease in his father, maternal grandfather, mother, and sister; Osteoporosis in his mother and sister.  ROS:   Please see the history of present illness.     All other systems reviewed and are negative.  EKGs/Labs/Other Studies Reviewed:    The following studies were reviewed today:  TTE 11/18/2019 1. Left ventricular ejection fraction, by visual estimation, is 60 to 65%. The left ventricle has normal function. There is no left ventricular hypertrophy.  2. The left ventricle has no regional wall motion abnormalities.  3. Global right ventricle has normal systolic function.The right ventricular size is normal. No increase in right ventricular wall thickness.  4. Left atrial size  was normal.  5. Right atrial size was normal.  6. The mitral valve is normal in structure. No evidence of mitral valve regurgitation. No evidence of mitral stenosis.  7. The tricuspid valve is normal in structure.  8. The aortic valve was not well visualized. Aortic valve regurgitation is not visualized. No evidence of aortic valve sclerosis or stenosis.  9. The pulmonic valve was not well visualized. Pulmonic valve regurgitation is not visualized. 10. Normal pulmonary artery systolic pressure. 11. The inferior vena cava is normal in size with greater than 50% respiratory variability, suggesting right atrial pressure of 3 mmHg.  EKG:  EKG is  ordered today.  The ekg ordered today demonstrates normal sinus rhythm, normal ECG.  Recent Labs: 07/06/2019: B Natriuretic Peptide 19.0 10/07/2019: Magnesium 1.8; TSH 0.750 10/11/2019: ALT 22; BUN 13; Creatinine, Ser 0.89; Hemoglobin 17.0; Platelets 347; Potassium 3.4; Sodium 136  Recent Lipid Panel    Component Value Date/Time   CHOL 166 05/05/2019 1424   TRIG 121 05/05/2019 1424   HDL 56 05/05/2019 1424   LDLCALC 86 05/05/2019 1424    Physical Exam:    VS:  BP (!) 150/100 (BP Location: Left Arm, Patient  Position: Sitting, Cuff Size: Normal)   Pulse 79   Ht 5\' 10"  (1.778 m)   Wt 183 lb 8 oz (83.2 kg)   SpO2 96%   BMI 26.33 kg/m     Wt Readings from Last 3 Encounters:  11/29/19 183 lb 8 oz (83.2 kg)  10/21/19 185 lb (83.9 kg)  10/07/19 180 lb (81.6 kg)     GEN:  Well nourished, well developed in no acute distress HEENT: Normal NECK: No JVD; No carotid bruits LYMPHATICS: No lymphadenopathy CARDIAC: RRR, no murmurs, rubs, gallops RESPIRATORY:  Clear to auscultation without rales, wheezing or rhonchi  ABDOMEN: Soft, non-tender, non-distended MUSCULOSKELETAL:  No edema; No deformity  SKIN: Warm and dry NEUROLOGIC:  Alert and oriented x 3 PSYCHIATRIC:  Normal affect   ASSESSMENT:   Echocardiogram showed normal ejection fraction and normal diastolic function no LVH.  Blood pressure improved but still elevated.  Heavy alcohol use is contributing to his elevated blood pressure.  1. Hypertension, unspecified type   2. Smoking   3. ETOH abuse    PLAN:    In order of problems listed above:  1. Increase hydralazine to 100 mg 3 times daily.  Continue Coreg 6.25 twice daily.  Patient encouraged to check blood pressure daily.   2. Smoking cessation advised. 3. EtOH cessation advised.  Patient agrees and states he will try to decrease consumption.  Follow-up in 4 weeks.  This note was generated in part or whole with voice recognition software. Voice recognition is usually quite accurate but there are transcription errors that can and very often do occur. I apologize for any typographical errors that were not detected and corrected.  Medication Adjustments/Labs and Tests Ordered: Current medicines are reviewed at length with the patient today.  Concerns regarding medicines are outlined above.  Orders Placed This Encounter  Procedures  . EKG 12-Lead   Meds ordered this encounter  Medications  . carvedilol (COREG) 6.25 MG tablet    Sig: Take 1 tablet (6.25 mg total) by mouth 2 (two)  times daily with a meal.    Dispense:  180 tablet    Refill:  3  . hydrALAZINE (APRESOLINE) 100 MG tablet    Sig: Take 1 tablet (100 mg total) by mouth 3 (three) times daily.    Dispense:  270 tablet    Refill:  3    Patient Instructions  Medication Instructions:  Your physician has recommended you make the following change in your medication:  1. Refill sent in for Carvedilol 6.25 mg twice a day 2. INCREASE Hydralazine 100 mg three times a day  *If you need a refill on your cardiac medications before your next appointment, please call your pharmacy*  Lab Work: None  If you have labs (blood work) drawn today and your tests are completely normal, you will receive your results only by: [name] MyChart Message (if you have MyChart) OR . A paper copy in the mail If you have any lab test that is abnormal or we need to change your treatment, we will call you to review the results.  Testing/Procedures: None   Follow-Up: At [facility], you and your health needs are our priority.  As part of our continuing mission to provide you with exceptional heart care, we have created designated Provider Care Teams.  These Care Teams include your primary Cardiologist (physician) and Advanced Practice Providers (APPs -  Physician Assistants and Nurse Practitioners) who all work together to provide you with the care you need, when you need it.  Your next appointment:   1 month(s)  The format for your next appointment:   In Person  Provider:    You may see Kate Sable, MD or one of the following Advanced Practice Providers on your designated Care Team:    Murray Hodgkins, NP  Christell Faith, PA-C  Marrianne Mood, PA-C     How to Take Your Blood Pressure You can take your blood pressure at home with a machine. You may need to check your blood pressure at home:  To check if you have high blood pressure (hypertension).  To check your blood pressure over time.  To make sure your  blood pressure medicine is working. Supplies needed: You will need a blood pressure machine, or monitor. You can buy one at a drugstore or online. When choosing one:  Choose one with an arm cuff.  Choose one that wraps around your upper arm. Only one finger should fit between your arm and the cuff.  Do not choose one that measures your blood pressure from your wrist or finger. Your doctor can suggest a monitor. How to prepare Avoid these things for 30 minutes before checking your blood pressure:  Drinking caffeine.  Drinking alcohol.  Eating.  Smoking.  Exercising. Five minutes before checking your blood pressure:  Pee.  Sit in a dining chair. Avoid sitting in a soft couch or armchair.  Be quiet. Do not talk. How to take your blood pressure Follow the instructions that came with your machine. If you have a digital blood pressure monitor, these may be the instructions: 1. Sit up straight. 2. Place your feet on the floor. Do not cross your ankles or legs. 3. Rest your left arm at the level of your heart. You may rest it on a table, desk, or chair. 4. Pull up your shirt sleeve. 5. Wrap the blood pressure cuff around the upper part of your left arm. The cuff should be 1 inch (2.5 cm) above your elbow. It is best to wrap the cuff around bare skin. 6. Fit the cuff snugly around your arm. You should be able to place only one finger between the cuff and your arm. 7. Put the cord inside the groove of your elbow. 8. Press the power button. 9. Sit quietly while  the cuff fills with air and loses air. 10. Write down the numbers on the screen. 11. Wait 2-3 minutes and then repeat steps 1-10. What do the numbers mean? Two numbers make up your blood pressure. The first number is called systolic pressure. The second is called diastolic pressure. An example of a blood pressure reading is "120 over 80" (or 120/80). If you are an adult and do not have a medical condition, use this guide to  find out if your blood pressure is normal: Normal  First number: below 120.  Second number: below 80. Elevated  First number: 120-129.  Second number: below 80. Hypertension stage 1  First number: 130-139.  Second number: 80-89. Hypertension stage 2  First number: 140 or above.  Second number: 60 or above. Your blood pressure is above normal even if only the top or bottom number is above normal. Follow these instructions at home:  Check your blood pressure as often as your doctor tells you to.  Take your monitor to your next doctor's appointment. Your doctor will: ? Make sure you are using it correctly. ? Make sure it is working right.  Make sure you understand what your blood pressure numbers should be.  Tell your doctor if your medicines are causing side effects. Contact a doctor if:  Your blood pressure keeps being high. Get help right away if:  Your first blood pressure number is higher than 180.  Your second blood pressure number is higher than 120. This information is not intended to replace advice given to you by your health care provider. Make sure you discuss any questions you have with your health care provider. Document Revised: 10/17/2017 Document Reviewed: 04/12/2016 Elsevier Patient Education  2020 [place], Kate Sable, MD  11/29/2019 10:20 AM    [place]

## 2019-12-15 ENCOUNTER — Telehealth: Payer: Self-pay

## 2019-12-21 ENCOUNTER — Telehealth: Payer: Self-pay

## 2019-12-24 ENCOUNTER — Ambulatory Visit (INDEPENDENT_AMBULATORY_CARE_PROVIDER_SITE_OTHER): Payer: Medicaid Other | Admitting: Nurse Practitioner

## 2019-12-24 ENCOUNTER — Encounter: Payer: Self-pay | Admitting: Nurse Practitioner

## 2019-12-24 VITALS — BP 150/91

## 2019-12-24 DIAGNOSIS — I700 Atherosclerosis of aorta: Secondary | ICD-10-CM

## 2019-12-24 DIAGNOSIS — I10 Essential (primary) hypertension: Secondary | ICD-10-CM | POA: Diagnosis not present

## 2019-12-24 DIAGNOSIS — J432 Centrilobular emphysema: Secondary | ICD-10-CM | POA: Diagnosis not present

## 2019-12-24 DIAGNOSIS — F17219 Nicotine dependence, cigarettes, with unspecified nicotine-induced disorders: Secondary | ICD-10-CM

## 2019-12-24 DIAGNOSIS — F10288 Alcohol dependence with other alcohol-induced disorder: Secondary | ICD-10-CM

## 2019-12-24 DIAGNOSIS — G894 Chronic pain syndrome: Secondary | ICD-10-CM

## 2019-12-24 MED ORDER — CHLORTHALIDONE 25 MG PO TABS: 12.5000 mg | ORAL_TABLET | Freq: Every day | ORAL | 2 refills | Status: DC

## 2019-12-24 MED ORDER — MONTELUKAST SODIUM 10 MG PO TABS: 10.0000 mg | ORAL_TABLET | Freq: Every day | ORAL | 3 refills | Status: DC

## 2019-12-24 NOTE — Assessment & Plan Note (Signed)
No current statin or ASA, this was noticed on November 2020 lung CT.  Will continue to recommend initiation of low dose CT and ASA + complete cessation of smoking.

## 2019-12-24 NOTE — Assessment & Plan Note (Signed)
Chronic, ongoing.  Being followed by pain management.  Aware to continue this collaboration, that PCP does not perform chronic pain management.

## 2019-12-24 NOTE — Assessment & Plan Note (Signed)
I have recommended complete cessation of tobacco use. I have discussed various options available for assistance with tobacco cessation including over the counter methods (Nicotine gum, patch and lozenges). We also discussed prescription options (Chantix, Nicotine Inhaler / Nasal Spray). The patient is not interested in pursuing any prescription tobacco cessation options at this time.  

## 2019-12-24 NOTE — Assessment & Plan Note (Addendum)
Chronic, ongoing.   Continue current medication regimen (recommend he take [place] as instructed) and continue cutting back on alcohol and smoking.  Continue collaboration with cardiology.  Will avoid ED medications at this time time due to uncontrolled BP.  Return in 3 months for follow-up in office, will obtain outpatient labs.

## 2019-12-24 NOTE — Progress Notes (Addendum)
BP (!) 150/91    Subjective:    Patient ID: Luke Pollen., male    DOB: Oct 16, 1961, 59 y.o.   MRN: [id number]  HPI: Luke Durst. is a 59 y.o. male  Chief Complaint  Patient presents with  . Hypertension  . COPD  . Pain    This visit was completed via telephone due to the restrictions of the COVID-19 pandemic. All issues as above were discussed and addressed but no physical exam was performed. If it was felt that the patient should be evaluated in the office, they were directed there. The patient verbally consented to this visit. Patient was unable to complete an audio/visual visit due to Lack of equipment. Due to the catastrophic nature of the COVID-19 pandemic, this visit was done through audio contact only.  Location of the patient: home  Location of the provider: work  Those involved with this call:  ? Provider: Marnee Guarneri, DNP ? CMA: [name], CMA ? Front Desk/Registration: Don Perking   Time spent on call: 15 minutes on the phone discussing health concerns. 10 minutes total spent in review of patient's record and preparation of their chart.   I verified patient identity using two factors (patient name and date of birth). Patient consents verbally to being seen via telemedicine visit today.   HYPERTENSION Continues on Hydralazine 100 MG TID (increased at recent cardiology visit), Hygroton (reports he is not taking this), Carvedilol 6.25 MG BID. Reports he has decreased his alcohol intake, 3 to 8 light beer every 3-4 days, previously drinking 10-15 beers a day. Reports he is feeling a lot better, with less headaches, dizziness, and tinnitus since he started cutting back on alcohol intake. Continues to smoke 1/2 to 1 PPD. Not interested in quitting.  Had echo performed 11/18/19 -- EF 60-65% and no left ventricular hypertrophy.  Returns to see cardiology on 11/29/2019.  Continues to ask about Viagra, discussed that at this time until better  BP control not comfortable prescribing ED medication. Hypertension status: stable  Satisfied with current treatment? yes Duration of hypertension: chronic BP monitoring frequency:  not checking BP range: 150-160.90-100's BP medication side effects:  no Medication compliance: good compliance Previous BP meds:has tried multiple medications and had ADR  Aspirin: no Recurrent headaches: no Visual changes: no Palpitations: no Dyspnea: no Chest pain: no Lower extremity edema: no Dizzy/lightheaded: no  CHRONIC PAIN: Currently being followed by Dr. Humphrey Rolls at pain clinic, recent visit on 12/22/2019 and was started on Morphine (MSIR) 15 MG BID.  On Norco for two years, currently takes this four times a day.  Has arthritis in neck, knees, hands, lower back. States he has deterioration ofdiscsin neck.[facility] Dr[facility] Christen Bame.Reports pain at baseline is10/10 at worst and 7/10 at best to knees, neck, hands,neck. Has had injections in both knees, steroidwhich helped for 1-2 weeksbut then stopped. No history epidural injections. Took Gabapentin at one time, but no longer takes as it made sick to stomach.Reports pain controlled now with change in medication and believes this is helping his BP.He is aware PCP does not perform chronic pain management and once pain management involved he needs to adhere to their instructions.  COPD Uses Combivent uses every day and Albuterol as needed.  Had initial lung CT 09/28/2019 for CA screening. COPD status: stable Satisfied with current treatment?: yes Oxygen use: no Dyspnea frequency: minimal Cough frequency: none Rescue inhaler frequency:   Limitation of activity: no Productive  cough: none Last Spirometry: unknown Pneumovax: Not up to Date Influenza: Up to Date  Relevant past medical, surgical, family and social history reviewed and updated as indicated. Interim medical history since our last  visit reviewed. Allergies and medications reviewed and updated.  Review of Systems  Constitutional: Negative for activity change, diaphoresis, fatigue and fever.  Respiratory: Negative for cough, chest tightness, shortness of breath and wheezing.   Cardiovascular: Negative for chest pain, palpitations and leg swelling.  Neurological: Negative.   Psychiatric/Behavioral: Negative.     Per HPI unless specifically indicated above     Objective:    BP (!) 150/91   Wt Readings from Last 3 Encounters:  11/29/19 183 lb 8 oz (83.2 kg)  10/21/19 185 lb (83.9 kg)  10/07/19 180 lb (81.6 kg)    Physical Exam  Unable to perform due to telephone visit only.  Results for orders placed or performed in visit on 10/11/19  CBC with Differential/Platelet out  Result Value Ref Range   WBC 10.0 3.4 - 10.8 x10E3/uL   RBC 5.53 4.14 - 5.80 x10E6/uL   Hemoglobin 17.0 13.0 - 17.7 g/dL   Hematocrit 50.8 37.5 - 51.0 %   MCV 92 79 - 97 fL   MCH 30.7 26.6 - 33.0 pg   MCHC 33.5 31.5 - 35.7 g/dL   RDW 12.4 11.6 - 15.4 %   Platelets 347 150 - 450 x10E3/uL   Neutrophils 71 Not Estab. %   Lymphs 18 Not Estab. %   Monocytes 9 Not Estab. %   Eos 1 Not Estab. %   Basos 1 Not Estab. %   Neutrophils Absolute 7.1 (H) 1.4 - 7.0 x10E3/uL   Lymphocytes Absolute 1.8 0.7 - 3.1 x10E3/uL   Monocytes Absolute 0.9 0.1 - 0.9 x10E3/uL   EOS (ABSOLUTE) 0.1 0.0 - 0.4 x10E3/uL   Basophils Absolute 0.1 0.0 - 0.2 x10E3/uL   Immature Granulocytes 0 Not Estab. %   Immature Grans (Abs) 0.0 0.0 - 0.1 x10E3/uL  Comprehensive metabolic panel  Result Value Ref Range   Glucose 83 65 - 99 mg/dL   BUN 13 6 - 24 mg/dL   Creatinine, Ser 0.89 0.76 - 1.27 mg/dL   GFR calc non Af Amer 96 >59 mL/min/1.73   GFR calc Af Amer 110 >59 mL/min/1.73   BUN/Creatinine Ratio 15 9 - 20   Sodium 136 134 - 144 mmol/L   Potassium 3.4 (L) 3.5 - 5.2 mmol/L   Chloride 92 (L) 96 - 106 mmol/L   CO2 26 20 - 29 mmol/L   Calcium 9.1 8.7 - 10.2 mg/dL     Total Protein 7.4 6.0 - 8.5 g/dL   Albumin 4.3 3.8 - 4.9 g/dL   Globulin, Total 3.1 1.5 - 4.5 g/dL   Albumin/Globulin Ratio 1.4 1.2 - 2.2   Bilirubin Total 0.8 0.0 - 1.2 mg/dL   Alkaline Phosphatase 93 39 - 117 IU/L   AST 27 0 - 40 IU/L   ALT 22 0 - 44 IU/L      Assessment & Plan:   Problem List Items Addressed This Visit      Cardiovascular and Mediastinum   Uncontrolled hypertension    Chronic, ongoing.   Continue current medication regimen (recommend he take Hygroton as instructed) and continue cutting back on alcohol and smoking.  Continue collaboration with cardiology.  Will avoid ED medications at this time time due to uncontrolled BP.  Return in 3 months for follow-up in office, will obtain outpatient labs.  Relevant Medications   chlorthalidone (HYGROTON) 25 MG tablet   Other Relevant Orders   Comprehensive metabolic panel   Lipid Panel Piccolo, Waived   Aortic atherosclerosis ([place])    No current statin or ASA, this was noticed on November 2020 lung CT.  Will continue to recommend initiation of low dose CT and ASA + complete cessation of smoking.      Relevant Medications   chlorthalidone (HYGROTON) 25 MG tablet   Other Relevant Orders   Lipid Panel Piccolo, Waived     Respiratory   COPD (chronic obstructive pulmonary disease) (HCC) - Primary    Chronic, progressive, stable at this time.  Continue current inhaler regimen and adjust as needed.  Plan on spirometry next visit.  Recommend complete cessation of smoking.      Relevant Medications   montelukast (SINGULAIR) 10 MG tablet   Other Relevant Orders   CBC with Differential/Platelet     Nervous and Auditory   Nicotine dependence, cigarettes, w unsp disorders    I have recommended complete cessation of tobacco use. I have discussed various options available for assistance with tobacco cessation including over the counter methods (Nicotine gum, patch and lozenges). We also discussed prescription options  (Chantix, Nicotine Inhaler / Nasal Spray). The patient is not interested in pursuing any prescription tobacco cessation options at this time.         Other   Chronic pain syndrome    Chronic, ongoing.  Being followed by pain management.  Aware to continue this collaboration, that PCP does not perform chronic pain management.      Relevant Medications   morphine (MSIR) 15 MG tablet   Alcohol dependence ([place])    Has been cutting back, praised for this and recommended continue cut back.  He refuses rehab or therapy.  Is aware of effect of heavy alcohol use on overall health, especially BP.      Relevant Orders   CBC with Differential/Platelet      I discussed the assessment and treatment plan with the patient. The patient was provided an opportunity to ask questions and all were answered. The patient agreed with the plan and demonstrated an understanding of the instructions.   The patient was advised to call back or seek an in-person evaluation if the symptoms worsen or if the condition fails to improve as anticipated.   I provided 15 minutes of time during this encounter.  Follow up plan: Return in about 3 months (around 03/22/2020) for HTN, COPD, Mood, BPH (need spirometry).

## 2019-12-24 NOTE — Assessment & Plan Note (Signed)
Has been cutting back, praised for this and recommended continue cut back.  He refuses rehab or therapy.  Is aware of effect of heavy alcohol use on overall health, especially BP.

## 2019-12-24 NOTE — Patient Instructions (Signed)
DASH Eating Plan DASH stands for "Dietary Approaches to Stop Hypertension." The DASH eating plan is a healthy eating plan that has been shown to reduce high blood pressure (hypertension). It may also reduce your risk for type 2 diabetes, heart disease, and stroke. The DASH eating plan may also help with weight loss. What are tips for following this plan?  General guidelines  Avoid eating more than 2,300 mg (milligrams) of salt (sodium) a day. If you have hypertension, you may need to reduce your sodium intake to 1,500 mg a day.  Limit alcohol intake to no more than 1 drink a day for nonpregnant women and 2 drinks a day for men. One drink equals 12 oz of beer, 5 oz of wine, or 1 oz of hard liquor.  Work with your health care provider to maintain a healthy body weight or to lose weight. Ask what an ideal weight is for you.  Get at least 30 minutes of exercise that causes your heart to beat faster (aerobic exercise) most days of the week. Activities may include walking, swimming, or biking.  Work with your health care provider or diet and nutrition specialist (dietitian) to adjust your eating plan to your individual calorie needs. Reading food labels   Check food labels for the amount of sodium per serving. Choose foods with less than 5 percent of the Daily Value of sodium. Generally, foods with less than 300 mg of sodium per serving fit into this eating plan.  To find whole grains, look for the word "whole" as the first word in the ingredient list. Shopping  Buy products labeled as "low-sodium" or "no salt added."  Buy fresh foods. Avoid canned foods and premade or frozen meals. Cooking  Avoid adding salt when cooking. Use salt-free seasonings or herbs instead of table salt or sea salt. Check with your health care provider or pharmacist before using salt substitutes.  Do not fry foods. Cook foods using healthy methods such as baking, boiling, grilling, and broiling instead.  Cook with  heart-healthy oils, such as olive, canola, soybean, or sunflower oil. Meal planning  Eat a balanced diet that includes: ? 5 or more servings of fruits and vegetables each day. At each meal, try to fill half of your plate with fruits and vegetables. ? Up to 6-8 servings of whole grains each day. ? Less than 6 oz of lean meat, poultry, or fish each day. A 3-oz serving of meat is about the same size as a deck of cards. One egg equals 1 oz. ? 2 servings of low-fat dairy each day. ? A serving of nuts, seeds, or beans 5 times each week. ? Heart-healthy fats. Healthy fats called Omega-3 fatty acids are found in foods such as flaxseeds and coldwater fish, like sardines, salmon, and mackerel.  Limit how much you eat of the following: ? Canned or prepackaged foods. ? Food that is high in trans fat, such as fried foods. ? Food that is high in saturated fat, such as fatty meat. ? Sweets, desserts, sugary drinks, and other foods with added sugar. ? Full-fat dairy products.  Do not salt foods before eating.  Try to eat at least 2 vegetarian meals each week.  Eat more home-cooked food and less restaurant, buffet, and fast food.  When eating at a restaurant, ask that your food be prepared with less salt or no salt, if possible. What foods are recommended? The items listed may not be a complete list. Talk with your dietitian about   what dietary choices are best for you. Grains Whole-grain or whole-wheat bread. Whole-grain or whole-wheat pasta. Brown rice. Oatmeal. Quinoa. Bulgur. Whole-grain and low-sodium cereals. Pita bread. Low-fat, low-sodium crackers. Whole-wheat flour tortillas. Vegetables Fresh or frozen vegetables (raw, steamed, roasted, or grilled). Low-sodium or reduced-sodium tomato and vegetable juice. Low-sodium or reduced-sodium tomato sauce and tomato paste. Low-sodium or reduced-sodium canned vegetables. Fruits All fresh, dried, or frozen fruit. Canned fruit in natural juice (without  added sugar). Meat and other protein foods Skinless chicken or turkey. Ground chicken or turkey. Pork with fat trimmed off. Fish and seafood. Egg whites. Dried beans, peas, or lentils. Unsalted nuts, nut butters, and seeds. Unsalted canned beans. Lean cuts of beef with fat trimmed off. Low-sodium, lean deli meat. Dairy Low-fat (1%) or fat-free (skim) milk. Fat-free, low-fat, or reduced-fat cheeses. Nonfat, low-sodium ricotta or cottage cheese. Low-fat or nonfat yogurt. Low-fat, low-sodium cheese. Fats and oils Soft margarine without trans fats. Vegetable oil. Low-fat, reduced-fat, or light mayonnaise and salad dressings (reduced-sodium). Canola, safflower, olive, soybean, and sunflower oils. Avocado. Seasoning and other foods Herbs. Spices. Seasoning mixes without salt. Unsalted popcorn and pretzels. Fat-free sweets. What foods are not recommended? The items listed may not be a complete list. Talk with your dietitian about what dietary choices are best for you. Grains Baked goods made with fat, such as croissants, muffins, or some breads. Dry pasta or rice meal packs. Vegetables Creamed or fried vegetables. Vegetables in a cheese sauce. Regular canned vegetables (not low-sodium or reduced-sodium). Regular canned tomato sauce and paste (not low-sodium or reduced-sodium). Regular tomato and vegetable juice (not low-sodium or reduced-sodium). Pickles. Olives. Fruits Canned fruit in a light or heavy syrup. Fried fruit. Fruit in cream or butter sauce. Meat and other protein foods Fatty cuts of meat. Ribs. Fried meat. Bacon. Sausage. Bologna and other processed lunch meats. Salami. Fatback. Hotdogs. Bratwurst. Salted nuts and seeds. Canned beans with added salt. Canned or smoked fish. Whole eggs or egg yolks. Chicken or turkey with skin. Dairy Whole or 2% milk, cream, and half-and-half. Whole or full-fat cream cheese. Whole-fat or sweetened yogurt. Full-fat cheese. Nondairy creamers. Whipped toppings.  Processed cheese and cheese spreads. Fats and oils Butter. Stick margarine. Lard. Shortening. Ghee. Bacon fat. Tropical oils, such as coconut, palm kernel, or palm oil. Seasoning and other foods Salted popcorn and pretzels. Onion salt, garlic salt, seasoned salt, table salt, and sea salt. Worcestershire sauce. Tartar sauce. Barbecue sauce. Teriyaki sauce. Soy sauce, including reduced-sodium. Steak sauce. Canned and packaged gravies. Fish sauce. Oyster sauce. Cocktail sauce. Horseradish that you find on the shelf. Ketchup. Mustard. Meat flavorings and tenderizers. Bouillon cubes. Hot sauce and Tabasco sauce. Premade or packaged marinades. Premade or packaged taco seasonings. Relishes. Regular salad dressings. Where to find more information:  National Heart, Lung, and Blood Institute: www.nhlbi.nih.gov  American Heart Association: www.heart.org Summary  The DASH eating plan is a healthy eating plan that has been shown to reduce high blood pressure (hypertension). It may also reduce your risk for type 2 diabetes, heart disease, and stroke.  With the DASH eating plan, you should limit salt (sodium) intake to 2,300 mg a day. If you have hypertension, you may need to reduce your sodium intake to 1,500 mg a day.  When on the DASH eating plan, aim to eat more fresh fruits and vegetables, whole grains, lean proteins, low-fat dairy, and heart-healthy fats.  Work with your health care provider or diet and nutrition specialist (dietitian) to adjust your eating plan to your   individual calorie needs. This information is not intended to replace advice given to you by your health care provider. Make sure you discuss any questions you have with your health care provider. Document Revised: 10/17/2017 Document Reviewed: 10/28/2016 Elsevier Patient Education  2020 Elsevier Inc.  

## 2019-12-24 NOTE — Assessment & Plan Note (Signed)
Chronic, progressive, stable at this time.  Continue current inhaler regimen and adjust as needed.  Plan on spirometry next visit.  Recommend complete cessation of smoking.

## 2019-12-27 ENCOUNTER — Encounter: Payer: Self-pay | Admitting: Nurse Practitioner

## 2019-12-27 NOTE — Progress Notes (Signed)
Called to schedule, no answer, left vm, sent mychart letter, mailed letter

## 2020-01-04 ENCOUNTER — Encounter: Payer: Self-pay | Admitting: Cardiology

## 2020-01-04 ENCOUNTER — Ambulatory Visit (INDEPENDENT_AMBULATORY_CARE_PROVIDER_SITE_OTHER): Payer: Medicaid Other | Admitting: Cardiology

## 2020-01-04 ENCOUNTER — Other Ambulatory Visit: Payer: Self-pay

## 2020-01-04 VITALS — BP 160/100 | HR 65 | Ht 70.0 in | Wt 187.8 lb

## 2020-01-04 DIAGNOSIS — F17200 Nicotine dependence, unspecified, uncomplicated: Secondary | ICD-10-CM | POA: Diagnosis not present

## 2020-01-04 DIAGNOSIS — F1010 Alcohol abuse, uncomplicated: Secondary | ICD-10-CM

## 2020-01-04 DIAGNOSIS — I10 Essential (primary) hypertension: Secondary | ICD-10-CM

## 2020-01-04 MED ORDER — CARVEDILOL 12.5 MG PO TABS: 12.5000 mg | ORAL_TABLET | Freq: Two times a day (BID) | ORAL | 1 refills | Status: DC

## 2020-01-04 NOTE — Patient Instructions (Signed)
Medication Instructions:  Your physician has recommended you make the following change in your medication:  1- INCREASE Carvedilol to 12.5 mg by mouth two times a day.  *If you need a refill on your cardiac medications before your next appointment, please call your pharmacy*  Lab Work: none If you have labs (blood work) drawn today and your tests are completely normal, you will receive your results only by: [name] MyChart Message (if you have MyChart) OR . A paper copy in the mail If you have any lab test that is abnormal or we need to change your treatment, we will call you to review the results.  Testing/Procedures: none  Follow-Up: At [facility], you and your health needs are our priority.  As part of our continuing mission to provide you with exceptional heart care, we have created designated Provider Care Teams.  These Care Teams include your primary Cardiologist (physician) and Advanced Practice Providers (APPs -  Physician Assistants and Nurse Practitioners) who all work together to provide you with the care you need, when you need it.  Your next appointment:   2 month(s)  The format for your next appointment:   In Person  Provider:   Kate Sable, MD

## 2020-01-04 NOTE — Progress Notes (Signed)
Cardiology Office Note:    Date:  01/04/2020   ID:  [name]., DOB [birth year], MRN [id number]  PCP:  [name], NP  Cardiologist:  Kate Sable, MD  Electrophysiologist:  None   Referring MD: [name], NP   Chief Complaint  Patient presents with  . other    1 month follow up. Meds reviewed by the pt. verbally. Pt. c/o shortness of breath.     History of Present Illness:    Luke Briggs. is a 59 y.o. 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  He was last seen due to elevated blood pressures.    Patient has  poorly controlled blood pressures for years.  He has tried several medications but stopped due to adverse effects.  Lisinopril caused angioedema, carvedilol caused nausea/vomiting, amlodipine caused severe dizziness.  He has been drinking heavily for the past 18 months after losing his fiance.  He drinks 10-15 beers daily.  He states he has been trying to cut back.  He also is a current smoker for over 40 years.  Patient has also tried chlorthalidone but was found to have hyponatremia and hypokalemia.  At the time, he was seen in emergency room for vomiting.  Blood pressure was noted to be elevated at 170/98.  He was started on hydralazine.  He seems to be tolerating the hydralazine okay with no adverse effects.    Dose of hydralazine was increased to 100 mg 3 times daily.  Decrease alcohol intake advised.  States he has decreased alcohol intake to 6 beers on the weekends.  Past Medical History:  Diagnosis Date  . Angioedema 08/07/2019  . Arthritis   . COPD (chronic obstructive pulmonary disease) ([place])   . GERD (gastroesophageal reflux disease)   . Hemorrhoids   . Hypertension   . Prostate disorder   . PTSD (post-traumatic stress disorder)   . Schizophrenia ([place])    Per patient's report.    Past Surgical History:    Procedure Laterality Date  . EYE SURGERY Left     Current Medications: Current Meds  Medication Sig  . albuterol (VENTOLIN HFA) 108 (90 Base) MCG/ACT inhaler Inhale 2 puffs into the lungs every 6 (six) hours as needed for wheezing or shortness of breath.  . chlorthalidone (HYGROTON) 25 MG tablet Take 0.5 tablets (12.5 mg total) by mouth daily. If tolerating in one week, then start taking whole pill (25 MG).  [name] EPINEPHrine 0.3 mg/0.3 mL IJ SOAJ injection Inject 0.3 mLs (0.3 mg total) into the muscle as needed for anaphylaxis.  . finasteride (PROSCAR) 5 MG tablet Take 1 tablet (5 mg total) by mouth daily.  . hydrALAZINE (APRESOLINE) 100 MG tablet Take 1 tablet (100 mg total) by mouth 3 (three) times daily.  . hydrocortisone (ANUSOL-HC) 2.5 % rectal cream Place 1 application rectally 2 (two) times daily. (Patient taking differently: Place 1 application rectally 2 (two) times daily as needed. )  . Ipratropium-Albuterol (COMBIVENT) 20-100 MCG/ACT AERS respimat Inhale 1 puff into the lungs every 6 (six) hours.  . montelukast (SINGULAIR) 10 MG tablet Take 1 tablet (10 mg total) by mouth at bedtime.  [name] morphine (MSIR) 15 MG tablet Take 15 mg by mouth 2 (two) times daily.  [name] omeprazole (PRILOSEC) 40 MG capsule Take 1 capsule (40 mg total) by mouth daily.  . Potassium 99 MG TABS Take 1 tablet  by mouth daily.  . tamsulosin (FLOMAX) 0.4 MG CAPS capsule Take 1 capsule (0.4 mg total) by mouth daily.  [name] triamcinolone cream (KENALOG) 0.1 % Apply 1 application topically 2 (two) times daily.  . [DISCONTINUED] carvedilol (COREG) 6.25 MG tablet Take 1 tablet (6.25 mg total) by mouth 2 (two) times daily with a meal.     Allergies:   Lisinopril, Amlodipine, and Penicillins   Social History   Socioeconomic History  . Marital status: Significant Other    Spouse name: Not on file  . Number of children: Not on file  . Years of education: Not on file  . Highest education level: Not on file  Occupational  History  . Not on file  Tobacco Use  . Smoking status: Current Every Day Smoker    Packs/day: 1.00    Years: 43.00    Pack years: 43.00    Types: Cigarettes  . Smokeless tobacco: Never Used  . Tobacco comment: .75 ppd now  Substance and Sexual Activity  . Alcohol use: Yes    Comment: beer on occasion  . Drug use: Never  . Sexual activity: Not on file  Other Topics Concern  . Not on file  Social History Narrative   Patient has new girlfriend they have been dating almost a year.    Social Determinants of Health   Financial Resource Strain: Low Risk   . Difficulty of Paying Living Expenses: Not very hard  Food Insecurity: No Food Insecurity  . Worried About Charity fundraiser in the Last Year: Never true  . Ran Out of Food in the Last Year: Never true  Transportation Needs: Unmet Transportation Needs  . Lack of Transportation (Medical): Yes  . Lack of Transportation (Non-Medical): Yes  Physical Activity: Inactive  . Days of Exercise per Week: 0 days  . Minutes of Exercise per Session: 0 min  Stress:   . Feeling of Stress : Not on file  Social Connections: Unknown  . Frequency of Communication with Friends and Family: More than three times a week  . Frequency of Social Gatherings with Friends and Family: Not on file  . Attends Religious Services: Not on file  . Active Member of Clubs or Organizations: Not on file  . Attends Archivist Meetings: Not on file  . Marital Status: Living with partner     Family History: The patient's family history includes Emphysema in his father and maternal grandmother; Heart disease in his father, maternal grandfather, mother, and sister; Osteoporosis in his mother and sister.  ROS:   Please see the history of present illness.     All other systems reviewed and are negative.  EKGs/Labs/Other Studies Reviewed:    The following studies were reviewed today:  TTE 11/18/2019 1. Left ventricular ejection fraction, by visual  estimation, is 60 to 65%. The left ventricle has normal function. There is no left ventricular hypertrophy.  2. The left ventricle has no regional wall motion abnormalities.  3. Global right ventricle has normal systolic function.The right ventricular size is normal. No increase in right ventricular wall thickness.  4. Left atrial size was normal.  5. Right atrial size was normal.  6. The mitral valve is normal in structure. No evidence of mitral valve regurgitation. No evidence of mitral stenosis.  7. The tricuspid valve is normal in structure.  8. The aortic valve was not well visualized. Aortic valve regurgitation is not visualized. No evidence of aortic valve sclerosis or stenosis.  9.  The pulmonic valve was not well visualized. Pulmonic valve regurgitation is not visualized. 10. Normal pulmonary artery systolic pressure. 11. The inferior vena cava is normal in size with greater than 50% respiratory variability, suggesting right atrial pressure of 3 mmHg.  EKG:  EKG is  ordered today.  The ekg ordered today demonstrates normal sinus rhythm, normal ECG.  Recent Labs: 07/06/2019: B Natriuretic Peptide 19.0 10/07/2019: Magnesium 1.8; TSH 0.750 10/11/2019: ALT 22; BUN 13; Creatinine, Ser 0.89; Hemoglobin 17.0; Platelets 347; Potassium 3.4; Sodium 136  Recent Lipid Panel    Component Value Date/Time   CHOL 166 05/05/2019 1424   TRIG 121 05/05/2019 1424   HDL 56 05/05/2019 1424   LDLCALC 86 05/05/2019 1424    Physical Exam:    VS:  BP (!) 160/100 (BP Location: Left Arm, Patient Position: Sitting, Cuff Size: Normal)   Pulse 65   Ht 5\' 10"  (1.778 m)   Wt 187 lb 12 oz (85.2 kg)   SpO2 98%   BMI 26.94 kg/m     Wt Readings from Last 3 Encounters:  01/04/20 187 lb 12 oz (85.2 kg)  11/29/19 183 lb 8 oz (83.2 kg)  10/21/19 185 lb (83.9 kg)     GEN:  Well nourished, well developed in no acute distress HEENT: Normal NECK: No JVD; No carotid bruits LYMPHATICS: No  lymphadenopathy CARDIAC: RRR, no murmurs, rubs, gallops RESPIRATORY:  Clear to auscultation without rales, wheezing or rhonchi  ABDOMEN: Soft, non-tender, non-distended MUSCULOSKELETAL:  No edema; No deformity  SKIN: Warm and dry NEUROLOGIC:  Alert and oriented x 3 PSYCHIATRIC:  Normal affect   ASSESSMENT:    1. Hypertension, unspecified type   2. Smoking   3. ETOH abuse    PLAN:    In order of problems listed above:  1. Blood pressure still not controlled.  Continue hydralazine to 100 mg 3 times daily.  Increase Coreg to 12.5 mg twice daily.  Patient encouraged to check blood pressure daily.  Alcohol consumption is likely contributing to elevated blood pressures.  Cessation advised. 2. Current smoker for over 40 years.  Smoking cessation advised. 3. EtOH cessation advised.   Follow-up in 8 weeks.  This note was generated in part or whole with voice recognition software. Voice recognition is usually quite accurate but there are transcription errors that can and very often do occur. I apologize for any typographical errors that were not detected and corrected.  Medication Adjustments/Labs and Tests Ordered: Current medicines are reviewed at length with the patient today.  Concerns regarding medicines are outlined above.  Orders Placed This Encounter  Procedures  . EKG 12-Lead   Meds ordered this encounter  Medications  . carvedilol (COREG) 12.5 MG tablet    Sig: Take 1 tablet (12.5 mg total) by mouth 2 (two) times daily.    Dispense:  180 tablet    Refill:  1    Patient Instructions  Medication Instructions:  Your physician has recommended you make the following change in your medication:  1- INCREASE Carvedilol to 12.5 mg by mouth two times a day.  *If you need a refill on your cardiac medications before your next appointment, please call your pharmacy*  Lab Work: none If you have labs (blood work) drawn today and your tests are completely normal, you will receive  your results only by: [name] MyChart Message (if you have MyChart) OR . A paper copy in the mail If you have any lab test that is abnormal or we need  to change your treatment, we will call you to review the results.  Testing/Procedures: none  Follow-Up: At [facility], you and your health needs are our priority.  As part of our continuing mission to provide you with exceptional heart care, we have created designated Provider Care Teams.  These Care Teams include your primary Cardiologist (physician) and Advanced Practice Providers (APPs -  Physician Assistants and Nurse Practitioners) who all work together to provide you with the care you need, when you need it.  Your next appointment:   2 month(s)  The format for your next appointment:   In Person  Provider:   Kate Sable, MD     Signed, Kate Sable, MD  01/04/2020 9:34 AM    [place]

## 2020-01-17 ENCOUNTER — Telehealth: Payer: Self-pay | Admitting: Cardiology

## 2020-01-17 NOTE — Telephone Encounter (Signed)
Pt c/o medication issue:  1. Name of Medication: Carvedilol and hydralazine  2. How are you currently taking this medication (dosage and times per day)? Carvedilol 12.5 mg 2 times daily, hydralazine 100 mg 3 times daily  3. Are you having a reaction (difficulty breathing--STAT)? no  4. What is your medication issue?  Ears ringing, head pounds.

## 2020-01-17 NOTE — Telephone Encounter (Signed)
Spoke with patient. States he's been having ringing in his ears and feels "like my head's going to explode." Says the ringing has not stopped at all. The head feeling comes and goes. Since the increase of hydralazine on 1/11 and carvedilol on 2/16, he reports his BP has staying in the 120-130's/80's, HR in the 80's. Had one episode on Saturday where BP went down to 89/67, he felt no energy and lightheaded. It did not last very long. Reports he drinks all water.  The ear ringing and head pain has been going on for the past 2 weeks. He thinks it started when he increased the carvedilol. No other symptoms at this time. Never had ringing in ears before. Advised I will route to Dr. Garen Lah for further advice.

## 2020-01-18 MED ORDER — CARVEDILOL 12.5 MG PO TABS: 6.2500 mg | ORAL_TABLET | Freq: Two times a day (BID) | ORAL | 1 refills | Status: DC

## 2020-01-18 NOTE — Telephone Encounter (Signed)
Patient calling back. He verbalized understanding to decrease coreg back to 6.25 mg two times a day.  He said he already did and is already feeling better. He will cut his current 12.5 mg tablets in half.  Med list updated.

## 2020-01-18 NOTE — Telephone Encounter (Addendum)
Kate Sable, MD  You 2 hours ago (8:40 AM)   Please decrease Coreg back to original dose of 6.25 mg twice daily. Hopefully symptoms improved with the decrease. Keep follow-up appointments. Thank you

## 2020-01-18 NOTE — Telephone Encounter (Signed)
No answer. Left message to call back.   

## 2020-02-09 ENCOUNTER — Telehealth: Payer: Self-pay | Admitting: Nurse Practitioner

## 2020-02-09 ENCOUNTER — Encounter: Payer: Self-pay | Admitting: Nurse Practitioner

## 2020-02-09 ENCOUNTER — Telehealth (INDEPENDENT_AMBULATORY_CARE_PROVIDER_SITE_OTHER): Payer: Medicaid Other | Admitting: Nurse Practitioner

## 2020-02-09 ENCOUNTER — Ambulatory Visit: Payer: Medicaid Other | Admitting: Nurse Practitioner

## 2020-02-09 VITALS — BP 138/85 | HR 77

## 2020-02-09 DIAGNOSIS — J432 Centrilobular emphysema: Secondary | ICD-10-CM

## 2020-02-09 DIAGNOSIS — F10288 Alcohol dependence with other alcohol-induced disorder: Secondary | ICD-10-CM | POA: Diagnosis not present

## 2020-02-09 DIAGNOSIS — I10 Essential (primary) hypertension: Secondary | ICD-10-CM

## 2020-02-09 DIAGNOSIS — G894 Chronic pain syndrome: Secondary | ICD-10-CM | POA: Diagnosis not present

## 2020-02-09 DIAGNOSIS — F17219 Nicotine dependence, cigarettes, with unspecified nicotine-induced disorders: Secondary | ICD-10-CM

## 2020-02-09 NOTE — Assessment & Plan Note (Signed)
Chronic, progressive, stable at this time.  Continue current inhaler regimen and adjust as needed.  Plan on spirometry in future once Covid pandemic improves.  Recommend complete cessation of smoking.

## 2020-02-09 NOTE — Patient Instructions (Signed)
DASH Eating Plan DASH stands for "Dietary Approaches to Stop Hypertension." The DASH eating plan is a healthy eating plan that has been shown to reduce high blood pressure (hypertension). It may also reduce your risk for type 2 diabetes, heart disease, and stroke. The DASH eating plan may also help with weight loss. What are tips for following this plan?  General guidelines  Avoid eating more than 2,300 mg (milligrams) of salt (sodium) a day. If you have hypertension, you may need to reduce your sodium intake to 1,500 mg a day.  Limit alcohol intake to no more than 1 drink a day for nonpregnant women and 2 drinks a day for men. One drink equals 12 oz of beer, 5 oz of wine, or 1 oz of hard liquor.  Work with your health care provider to maintain a healthy body weight or to lose weight. Ask what an ideal weight is for you.  Get at least 30 minutes of exercise that causes your heart to beat faster (aerobic exercise) most days of the week. Activities may include walking, swimming, or biking.  Work with your health care provider or diet and nutrition specialist (dietitian) to adjust your eating plan to your individual calorie needs. Reading food labels   Check food labels for the amount of sodium per serving. Choose foods with less than 5 percent of the Daily Value of sodium. Generally, foods with less than 300 mg of sodium per serving fit into this eating plan.  To find whole grains, look for the word "whole" as the first word in the ingredient list. Shopping  Buy products labeled as "low-sodium" or "no salt added."  Buy fresh foods. Avoid canned foods and premade or frozen meals. Cooking  Avoid adding salt when cooking. Use salt-free seasonings or herbs instead of table salt or sea salt. Check with your health care provider or pharmacist before using salt substitutes.  Do not fry foods. Cook foods using healthy methods such as baking, boiling, grilling, and broiling instead.  Cook with  heart-healthy oils, such as olive, canola, soybean, or sunflower oil. Meal planning  Eat a balanced diet that includes: ? 5 or more servings of fruits and vegetables each day. At each meal, try to fill half of your plate with fruits and vegetables. ? Up to 6-8 servings of whole grains each day. ? Less than 6 oz of lean meat, poultry, or fish each day. A 3-oz serving of meat is about the same size as a deck of cards. One egg equals 1 oz. ? 2 servings of low-fat dairy each day. ? A serving of nuts, seeds, or beans 5 times each week. ? Heart-healthy fats. Healthy fats called Omega-3 fatty acids are found in foods such as flaxseeds and coldwater fish, like sardines, salmon, and mackerel.  Limit how much you eat of the following: ? Canned or prepackaged foods. ? Food that is high in trans fat, such as fried foods. ? Food that is high in saturated fat, such as fatty meat. ? Sweets, desserts, sugary drinks, and other foods with added sugar. ? Full-fat dairy products.  Do not salt foods before eating.  Try to eat at least 2 vegetarian meals each week.  Eat more home-cooked food and less restaurant, buffet, and fast food.  When eating at a restaurant, ask that your food be prepared with less salt or no salt, if possible. What foods are recommended? The items listed may not be a complete list. Talk with your dietitian about   what dietary choices are best for you. Grains Whole-grain or whole-wheat bread. Whole-grain or whole-wheat pasta. Brown rice. Oatmeal. Quinoa. Bulgur. Whole-grain and low-sodium cereals. Pita bread. Low-fat, low-sodium crackers. Whole-wheat flour tortillas. Vegetables Fresh or frozen vegetables (raw, steamed, roasted, or grilled). Low-sodium or reduced-sodium tomato and vegetable juice. Low-sodium or reduced-sodium tomato sauce and tomato paste. Low-sodium or reduced-sodium canned vegetables. Fruits All fresh, dried, or frozen fruit. Canned fruit in natural juice (without  added sugar). Meat and other protein foods Skinless chicken or turkey. Ground chicken or turkey. Pork with fat trimmed off. Fish and seafood. Egg whites. Dried beans, peas, or lentils. Unsalted nuts, nut butters, and seeds. Unsalted canned beans. Lean cuts of beef with fat trimmed off. Low-sodium, lean deli meat. Dairy Low-fat (1%) or fat-free (skim) milk. Fat-free, low-fat, or reduced-fat cheeses. Nonfat, low-sodium ricotta or cottage cheese. Low-fat or nonfat yogurt. Low-fat, low-sodium cheese. Fats and oils Soft margarine without trans fats. Vegetable oil. Low-fat, reduced-fat, or light mayonnaise and salad dressings (reduced-sodium). Canola, safflower, olive, soybean, and sunflower oils. Avocado. Seasoning and other foods Herbs. Spices. Seasoning mixes without salt. Unsalted popcorn and pretzels. Fat-free sweets. What foods are not recommended? The items listed may not be a complete list. Talk with your dietitian about what dietary choices are best for you. Grains Baked goods made with fat, such as croissants, muffins, or some breads. Dry pasta or rice meal packs. Vegetables Creamed or fried vegetables. Vegetables in a cheese sauce. Regular canned vegetables (not low-sodium or reduced-sodium). Regular canned tomato sauce and paste (not low-sodium or reduced-sodium). Regular tomato and vegetable juice (not low-sodium or reduced-sodium). Pickles. Olives. Fruits Canned fruit in a light or heavy syrup. Fried fruit. Fruit in cream or butter sauce. Meat and other protein foods Fatty cuts of meat. Ribs. Fried meat. Bacon. Sausage. Bologna and other processed lunch meats. Salami. Fatback. Hotdogs. Bratwurst. Salted nuts and seeds. Canned beans with added salt. Canned or smoked fish. Whole eggs or egg yolks. Chicken or turkey with skin. Dairy Whole or 2% milk, cream, and half-and-half. Whole or full-fat cream cheese. Whole-fat or sweetened yogurt. Full-fat cheese. Nondairy creamers. Whipped toppings.  Processed cheese and cheese spreads. Fats and oils Butter. Stick margarine. Lard. Shortening. Ghee. Bacon fat. Tropical oils, such as coconut, palm kernel, or palm oil. Seasoning and other foods Salted popcorn and pretzels. Onion salt, garlic salt, seasoned salt, table salt, and sea salt. Worcestershire sauce. Tartar sauce. Barbecue sauce. Teriyaki sauce. Soy sauce, including reduced-sodium. Steak sauce. Canned and packaged gravies. Fish sauce. Oyster sauce. Cocktail sauce. Horseradish that you find on the shelf. Ketchup. Mustard. Meat flavorings and tenderizers. Bouillon cubes. Hot sauce and Tabasco sauce. Premade or packaged marinades. Premade or packaged taco seasonings. Relishes. Regular salad dressings. Where to find more information:  National Heart, Lung, and Blood Institute: www.nhlbi.nih.gov  American Heart Association: www.heart.org Summary  The DASH eating plan is a healthy eating plan that has been shown to reduce high blood pressure (hypertension). It may also reduce your risk for type 2 diabetes, heart disease, and stroke.  With the DASH eating plan, you should limit salt (sodium) intake to 2,300 mg a day. If you have hypertension, you may need to reduce your sodium intake to 1,500 mg a day.  When on the DASH eating plan, aim to eat more fresh fruits and vegetables, whole grains, lean proteins, low-fat dairy, and heart-healthy fats.  Work with your health care provider or diet and nutrition specialist (dietitian) to adjust your eating plan to your   individual calorie needs. This information is not intended to replace advice given to you by your health care provider. Make sure you discuss any questions you have with your health care provider. Document Revised: 10/17/2017 Document Reviewed: 10/28/2016 Elsevier Patient Education  2020 Elsevier Inc.  

## 2020-02-09 NOTE — Telephone Encounter (Signed)
Pt stated he forgot to mention at the virtual apt that he is having restless legs and wants to know if there he can do for this. Please advise.

## 2020-02-09 NOTE — Assessment & Plan Note (Signed)
Has been cutting back, praised for this and recommended continue cut back.  He refuses rehab or therapy.  Is aware of effect of heavy alcohol use on overall health, especially BP.

## 2020-02-09 NOTE — Assessment & Plan Note (Signed)
Chronic, ongoing with some improvement.   Continue current medication regimen and continue cutting back on alcohol and smoking.  Continue collaboration with cardiology.  Will avoid ED medications at this time due to BP, if remains stable after upcoming cardiology appointment may consider adding ED medication as needed.  Return in 3 months for follow-up in office.

## 2020-02-09 NOTE — Progress Notes (Signed)
BP 138/85   Pulse 77    Subjective:    Patient ID: Luke Briggs., male    DOB: 07-10-1961, 59 y.o.   MRN: [id number]  HPI: Luke Briggs. is a 59 y.o. male  Chief Complaint  Patient presents with  . Follow-up    This visit was completed via telephone due to the restrictions of the COVID-19 pandemic. All issues as above were discussed and addressed but no physical exam was performed. If it was felt that the patient should be evaluated in the office, they were directed there. The patient verbally consented to this visit. Patient was unable to complete an audio/visual visit due to Lack of equipment. Due to the catastrophic nature of the COVID-19 pandemic, this visit was done through audio contact only.  Location of the patient: home  Location of the provider: work  Those involved with this call:  ? Provider: Marnee Guarneri, DNP ? CMA: [name], CMA ? Front Desk/Registration: Don Perking   Time spent on call: 15 minutes on the phone discussing health concerns. 10 minutes total spent in review of patient's record and preparation of their chart.   I verified patient identity using two factors (patient name and date of birth). Patient consents verbally to being seen via telemedicine visit today.   HYPERTENSION Saw cardiology on 01/04/2020.  Continues on Hydralazine 100 MG TID & Carvedilol 12.5 MG (increased recently at cardiology). Reports he has decreased his alcohol intake, 5-6 light beer every 3-4 days, previously drinking 10-15 beers a day.Continues to smoke 1/2 to 1 PPD. Not interested in quitting.  Had echo performed 11/18/19 -- EF 60-65% and no left ventricular hypertrophy.  Returns to see cardiology on 11/29/2019.  Continues to ask about Viagra, discussed that at this time until BP showing consistent good control will hold off on this.  Sees cardiology next in April -- will discuss further after this if BP continues to have good control.  Hypertension status: stable  Satisfied with current treatment? yes Duration of hypertension: chronic BP monitoring frequency:  occasional -- twice a week BP range: 130/80 range when checking at home BP medication side effects:  no Medication compliance: good compliance Previous BP meds:has tried multiple medications and had ADR  Aspirin: no Recurrent headaches: no Visual changes: no Palpitations: no Dyspnea: no Chest pain: no Lower extremity edema: no Dizzy/lightheaded: no  CHRONIC PAIN: Currently being followed by Dr. Humphrey Rolls at pain clinic, recent visit on 01/30/2020 and stopped Morphine (MSIR) 15 MG BID due to nausea and was started on Fentanyl patch.  Has arthritis in neck, knees, hands, lower back. States he has deterioration ofdiscsin neck.[facility] Dr[facility] Christen Bame.Reports pain at baseline is10/10 at worst and 7/10 at best to knees, neck, hands,neck. Has had injections in both knees, steroidwhich helped for 1-2 weeksbut then stopped. No history epidural injections. Took Gabapentin at one time, but no longer takes as it made sick to stomach.Reports pain controlled now with change in medication and believes this is helping his BP.He is aware PCP does not perform chronic pain management and once pain management involved he needs to adhere to their instructions.  COPD Uses Combivent uses every day and Albuterol as needed.  Had initial lung CT 09/28/2019 for CA screening. COPD status: stable Satisfied with current treatment?: yes Oxygen use: no Dyspnea frequency: minimal Cough frequency: none Rescue inhaler frequency:   Limitation of activity: no Productive cough: none Last Spirometry: unknown Pneumovax: Not  up to Date Influenza: Up to Date  Relevant past medical, surgical, family and social history reviewed and updated as indicated. Interim medical history since our last visit reviewed. Allergies and medications  reviewed and updated.  Review of Systems  Constitutional: Negative for activity change, diaphoresis, fatigue and fever.  Respiratory: Negative for cough, chest tightness, shortness of breath and wheezing.   Cardiovascular: Negative for chest pain, palpitations and leg swelling.  Neurological: Negative.   Psychiatric/Behavioral: Negative.     Per HPI unless specifically indicated above     Objective:    BP 138/85   Pulse 77   Wt Readings from Last 3 Encounters:  01/04/20 187 lb 12 oz (85.2 kg)  11/29/19 183 lb 8 oz (83.2 kg)  10/21/19 185 lb (83.9 kg)    Physical Exam  Unable to perform due to telephone visit only.  Results for orders placed or performed in visit on 10/11/19  CBC with Differential/Platelet out  Result Value Ref Range   WBC 10.0 3.4 - 10.8 x10E3/uL   RBC 5.53 4.14 - 5.80 x10E6/uL   Hemoglobin 17.0 13.0 - 17.7 g/dL   Hematocrit 50.8 37.5 - 51.0 %   MCV 92 79 - 97 fL   MCH 30.7 26.6 - 33.0 pg   MCHC 33.5 31.5 - 35.7 g/dL   RDW 12.4 11.6 - 15.4 %   Platelets 347 150 - 450 x10E3/uL   Neutrophils 71 Not Estab. %   Lymphs 18 Not Estab. %   Monocytes 9 Not Estab. %   Eos 1 Not Estab. %   Basos 1 Not Estab. %   Neutrophils Absolute 7.1 (H) 1.4 - 7.0 x10E3/uL   Lymphocytes Absolute 1.8 0.7 - 3.1 x10E3/uL   Monocytes Absolute 0.9 0.1 - 0.9 x10E3/uL   EOS (ABSOLUTE) 0.1 0.0 - 0.4 x10E3/uL   Basophils Absolute 0.1 0.0 - 0.2 x10E3/uL   Immature Granulocytes 0 Not Estab. %   Immature Grans (Abs) 0.0 0.0 - 0.1 x10E3/uL  Comprehensive metabolic panel  Result Value Ref Range   Glucose 83 65 - 99 mg/dL   BUN 13 6 - 24 mg/dL   Creatinine, Ser 0.89 0.76 - 1.27 mg/dL   GFR calc non Af Amer 96 >59 mL/min/1.73   GFR calc Af Amer 110 >59 mL/min/1.73   BUN/Creatinine Ratio 15 9 - 20   Sodium 136 134 - 144 mmol/L   Potassium 3.4 (L) 3.5 - 5.2 mmol/L   Chloride 92 (L) 96 - 106 mmol/L   CO2 26 20 - 29 mmol/L   Calcium 9.1 8.7 - 10.2 mg/dL   Total Protein 7.4 6.0 -  8.5 g/dL   Albumin 4.3 3.8 - 4.9 g/dL   Globulin, Total 3.1 1.5 - 4.5 g/dL   Albumin/Globulin Ratio 1.4 1.2 - 2.2   Bilirubin Total 0.8 0.0 - 1.2 mg/dL   Alkaline Phosphatase 93 39 - 117 IU/L   AST 27 0 - 40 IU/L   ALT 22 0 - 44 IU/L      Assessment & Plan:   Problem List Items Addressed This Visit      Cardiovascular and Mediastinum   Uncontrolled hypertension    Chronic, ongoing with some improvement.   Continue current medication regimen and continue cutting back on alcohol and smoking.  Continue collaboration with cardiology.  Will avoid ED medications at this time due to BP, if remains stable after upcoming cardiology appointment may consider adding ED medication as needed.  Return in 3 months for follow-up in  office.        Respiratory   COPD (chronic obstructive pulmonary disease) (HCC) - Primary    Chronic, progressive, stable at this time.  Continue current inhaler regimen and adjust as needed.  Plan on spirometry in future once Covid pandemic improves.  Recommend complete cessation of smoking.        Nervous and Auditory   Nicotine dependence, cigarettes, w unsp disorders    I have recommended complete cessation of tobacco use. I have discussed various options available for assistance with tobacco cessation including over the counter methods (Nicotine gum, patch and lozenges). We also discussed prescription options (Chantix, Nicotine Inhaler / Nasal Spray). The patient is not interested in pursuing any prescription tobacco cessation options at this time.         Other   Chronic pain syndrome    Chronic, ongoing.  Being followed by pain management.  Aware to continue this collaboration, that PCP does not perform chronic pain management.      Relevant Medications   fentaNYL (DURAGESIC) 25 MCG/HR   Alcohol dependence ([place])    Has been cutting back, praised for this and recommended continue cut back.  He refuses rehab or therapy.  Is aware of effect of heavy alcohol use  on overall health, especially BP.         I discussed the assessment and treatment plan with the patient. The patient was provided an opportunity to ask questions and all were answered. The patient agreed with the plan and demonstrated an understanding of the instructions.   The patient was advised to call back or seek an in-person evaluation if the symptoms worsen or if the condition fails to improve as anticipated.   I provided 15 minutes of time during this encounter.  Follow up plan: Return in about 8 weeks (around 04/05/2020) for HTN -- in office -- needs labs.

## 2020-02-09 NOTE — Assessment & Plan Note (Signed)
Chronic, ongoing.  Being followed by pain management.  Aware to continue this collaboration, that PCP does not perform chronic pain management.

## 2020-02-09 NOTE — Telephone Encounter (Signed)
He can either discuss this further at next visit or discuss with pain management provider.  If worsening can schedule to see me sooner.  Can also try over the counter magnesium supplement, which at times offers benefit.

## 2020-02-09 NOTE — Assessment & Plan Note (Signed)
I have recommended complete cessation of tobacco use. I have discussed various options available for assistance with tobacco cessation including over the counter methods (Nicotine gum, patch and lozenges). We also discussed prescription options (Chantix, Nicotine Inhaler / Nasal Spray). The patient is not interested in pursuing any prescription tobacco cessation options at this time.  

## 2020-02-14 NOTE — Telephone Encounter (Signed)
Pt undersood and verbalized understanding

## 2020-02-18 ENCOUNTER — Other Ambulatory Visit: Payer: Self-pay | Admitting: Nurse Practitioner

## 2020-02-29 ENCOUNTER — Telehealth: Payer: Self-pay | Admitting: Cardiology

## 2020-02-29 NOTE — Telephone Encounter (Signed)
Patient has MyChart. Should be able to do Virtual Visit that way. Routing to Dr Garen Lah to make sure this is ok.

## 2020-02-29 NOTE — Telephone Encounter (Signed)
Patient has another appt and wants to do a virtual visit.    Is this ok with provider ?

## 2020-03-01 NOTE — Telephone Encounter (Signed)
Luke Sable, MD  You 1 hour ago (10:11 AM)   Phone or virtual visit is okay. We just need a blood pressure as patient's follow-up is due to difficult to control blood pressure.   Routing comment     Ok to make virtual. Please make sure patient has home blood pressure cuff as we will need the readings.

## 2020-03-03 ENCOUNTER — Ambulatory Visit: Payer: Medicaid Other | Admitting: Cardiology

## 2020-03-14 ENCOUNTER — Ambulatory Visit: Payer: Medicaid Other | Admitting: Cardiology

## 2020-03-20 ENCOUNTER — Telehealth: Payer: Self-pay

## 2020-03-20 NOTE — Telephone Encounter (Signed)
Received surgical clearance form for patient. Surgery is scheduled for 03/28/20 and patient has appointment with [facility] 04/05/20 for a follow up. [name] states that it is ok to move the 04/05/20 appointment up and do the clearance and follow up together in the same appointment.   LVM asking for patient to please call back to move 04/05/20 appointment before 03/28/20.

## 2020-03-20 NOTE — Telephone Encounter (Signed)
Appointment moved to 03/22/20

## 2020-03-21 ENCOUNTER — Encounter: Payer: Self-pay | Admitting: Ophthalmology

## 2020-03-21 ENCOUNTER — Other Ambulatory Visit: Payer: Self-pay

## 2020-03-22 ENCOUNTER — Ambulatory Visit (INDEPENDENT_AMBULATORY_CARE_PROVIDER_SITE_OTHER): Payer: Medicaid Other | Admitting: Nurse Practitioner

## 2020-03-22 ENCOUNTER — Other Ambulatory Visit: Payer: Self-pay

## 2020-03-22 ENCOUNTER — Encounter: Payer: Self-pay | Admitting: Nurse Practitioner

## 2020-03-22 VITALS — BP 132/78 | HR 76 | Temp 97.6°F | Wt 189.4 lb

## 2020-03-22 DIAGNOSIS — J432 Centrilobular emphysema: Secondary | ICD-10-CM | POA: Diagnosis not present

## 2020-03-22 DIAGNOSIS — F10288 Alcohol dependence with other alcohol-induced disorder: Secondary | ICD-10-CM

## 2020-03-22 DIAGNOSIS — N528 Other male erectile dysfunction: Secondary | ICD-10-CM

## 2020-03-22 DIAGNOSIS — N529 Male erectile dysfunction, unspecified: Secondary | ICD-10-CM | POA: Insufficient documentation

## 2020-03-22 DIAGNOSIS — I10 Essential (primary) hypertension: Secondary | ICD-10-CM

## 2020-03-22 DIAGNOSIS — Z01818 Encounter for other preprocedural examination: Secondary | ICD-10-CM | POA: Diagnosis not present

## 2020-03-22 DIAGNOSIS — I700 Atherosclerosis of aorta: Secondary | ICD-10-CM | POA: Diagnosis not present

## 2020-03-22 LAB — LIPID PANEL PICCOLO, WAIVED
Chol/HDL Ratio Piccolo,Waive: 2.6 mg/dL
Cholesterol Piccolo, Waived: 171 mg/dL (ref <200)
HDL Chol Piccolo, Waived: 66 mg/dL (ref >59)
LDL Chol Calc Piccolo Waived: 70 mg/dL (ref <100)
Triglycerides Piccolo,Waived: 173 mg/dL — ABNORMAL HIGH (ref <150)
VLDL Chol Calc Piccolo,Waive: 35 mg/dL — ABNORMAL HIGH (ref <30)

## 2020-03-22 MED ORDER — SILDENAFIL CITRATE 50 MG PO TABS: 50.0000 mg | ORAL_TABLET | Freq: Every day | ORAL | 0 refills | Status: DC | PRN

## 2020-03-22 NOTE — Assessment & Plan Note (Signed)
Secondary to HTN, at this time BP under better control.  Will trial a low dose of Viagra and educated patient on this.  Made him aware of risks/benefits.  Aware to stop use if any major side effects present.  Script sent #5 pills only.

## 2020-03-22 NOTE — Assessment & Plan Note (Signed)
Has been cutting back, praised for this and recommended continue cut back.  He refuses rehab or therapy.  Is aware of effect of heavy alcohol use on overall health, especially BP.

## 2020-03-22 NOTE — Assessment & Plan Note (Addendum)
Chronic, ongoing with improvement.   Continue current medication regimen and continue cutting back on alcohol and smoking.  Continue collaboration with cardiology.  Will trial ED medication as needed, but to discontinue if any side effects present.  CMP today.  Return in 3 months for follow-up in office.

## 2020-03-22 NOTE — Progress Notes (Signed)
BP 132/78 (BP Location: Left Arm)   Pulse 76   Temp 97.6 F (36.4 C) (Oral)   Wt 189 lb 6.4 oz (85.9 kg)   SpO2 97%   BMI 27.18 kg/m    Subjective:    Patient ID: [name]., male    DOB: 07-Feb-1961, 59 y.o.   MRN: [id number]  HPI: Armstrong Lollis. is a 59 y.o. male  Chief Complaint  Patient presents with  . Pre-op Exam   PREOP: Needs preop  HYPERTENSION Saw cardiology on 01/04/2020.  Continues onHydralazine 100 MG TID & Carvedilol 12.5 MG (increased recently at cardiology). Reports he has decreased his alcohol intake,5-6 light beerevery 3-4 days, previously drinking 10-15 beers a day.Continues to smoke 1/2 to 1 PPD.Not interested in quitting.  Had echo performed 11/18/19 -- EF 60-65% andno left ventricular hypertrophy.  Returns to see cardiology 04/11/20.  Continues to ask about Viagra, BP under better control and overall feeling better.  Discussed at length with him will trial low dose, but if any issues not to continue taking. Hypertension status:stable Satisfied with current treatment?yes Duration of hypertension:chronic BP monitoring frequency: occasional -- twice a week BP range: 130/80 range when checking at home BP medication side effects:no Medication compliance:good compliance Previous BP meds:has tried multiple medications and had ADR Aspirin:no Recurrent headaches:no Visual changes:no Palpitations:no Dyspnea:no Chest pain:no Lower extremity edema:no Dizzy/lightheaded:no  COPD Uses Combivent uses every day and Albuterol as needed.  Had initial lung CT 09/28/2019 for CA screening which noted emphysema and aortic atherosclerosis. COPD status: stable Satisfied with current treatment?: yes Oxygen use: no Dyspnea frequency: minimal Cough frequency: none Rescue inhaler frequency:   Limitation of activity: no Productive cough: none Last Spirometry: unknown Pneumovax: Not up to Date Influenza: Up to Date   Relevant  past medical, surgical, family and social history reviewed and updated as indicated. Interim medical history since our last visit reviewed. Allergies and medications reviewed and updated.  Review of Systems  Constitutional: Negative for activity change, diaphoresis, fatigue and fever.  Respiratory: Negative for cough, chest tightness, shortness of breath and wheezing.   Cardiovascular: Negative for chest pain, palpitations and leg swelling.  Gastrointestinal: Negative.   Neurological: Negative.   Psychiatric/Behavioral: Negative.     Per HPI unless specifically indicated above     Objective:    BP 132/78 (BP Location: Left Arm)   Pulse 76   Temp 97.6 F (36.4 C) (Oral)   Wt 189 lb 6.4 oz (85.9 kg)   SpO2 97%   BMI 27.18 kg/m   Wt Readings from Last 3 Encounters:  03/22/20 189 lb 6.4 oz (85.9 kg)  01/04/20 187 lb 12 oz (85.2 kg)  11/29/19 183 lb 8 oz (83.2 kg)    Physical Exam Vitals and nursing note reviewed.  Constitutional:      General: He is awake. He is not in acute distress.    Appearance: He is well-developed and well-groomed. He is not ill-appearing.  HENT:     Head: Normocephalic and atraumatic.     Right Ear: Hearing normal. No drainage.     Left Ear: Hearing normal. No drainage.  Eyes:     General: Lids are normal.        Right eye: No discharge.        Left eye: No discharge.     Conjunctiva/sclera: Conjunctivae normal.     Pupils: Pupils are equal, round, and reactive to light.  Neck:     Vascular: No  carotid bruit.  Cardiovascular:     Rate and Rhythm: Normal rate and regular rhythm.     Heart sounds: Normal heart sounds, S1 normal and S2 normal. No murmur. No gallop.   Pulmonary:     Effort: Pulmonary effort is normal. No accessory muscle usage or respiratory distress.     Breath sounds: Normal breath sounds.  Abdominal:     General: Bowel sounds are normal.     Palpations: Abdomen is soft.  Musculoskeletal:        General: Normal range of  motion.     Cervical back: Normal range of motion and neck supple.     Right lower leg: No edema.     Left lower leg: No edema.  Skin:    General: Skin is warm and dry.     Capillary Refill: Capillary refill takes less than 2 seconds.  Neurological:     Mental Status: He is alert and oriented to person, place, and time.  Psychiatric:        Attention and Perception: Attention normal.        Mood and Affect: Mood normal.        Speech: Speech normal.        Behavior: Behavior normal. Behavior is cooperative.        Thought Content: Thought content normal.    EKG in office NSR with normal axis and rate 70.  Results for orders placed or performed in visit on 10/11/19  CBC with Differential/Platelet out  Result Value Ref Range   WBC 10.0 3.4 - 10.8 x10E3/uL   RBC 5.53 4.14 - 5.80 x10E6/uL   Hemoglobin 17.0 13.0 - 17.7 g/dL   Hematocrit 50.8 37.5 - 51.0 %   MCV 92 79 - 97 fL   MCH 30.7 26.6 - 33.0 pg   MCHC 33.5 31.5 - 35.7 g/dL   RDW 12.4 11.6 - 15.4 %   Platelets 347 150 - 450 x10E3/uL   Neutrophils 71 Not Estab. %   Lymphs 18 Not Estab. %   Monocytes 9 Not Estab. %   Eos 1 Not Estab. %   Basos 1 Not Estab. %   Neutrophils Absolute 7.1 (H) 1.4 - 7.0 x10E3/uL   Lymphocytes Absolute 1.8 0.7 - 3.1 x10E3/uL   Monocytes Absolute 0.9 0.1 - 0.9 x10E3/uL   EOS (ABSOLUTE) 0.1 0.0 - 0.4 x10E3/uL   Basophils Absolute 0.1 0.0 - 0.2 x10E3/uL   Immature Granulocytes 0 Not Estab. %   Immature Grans (Abs) 0.0 0.0 - 0.1 x10E3/uL  Comprehensive metabolic panel  Result Value Ref Range   Glucose 83 65 - 99 mg/dL   BUN 13 6 - 24 mg/dL   Creatinine, Ser 0.89 0.76 - 1.27 mg/dL   GFR calc non Af Amer 96 >59 mL/min/1.73   GFR calc Af Amer 110 >59 mL/min/1.73   BUN/Creatinine Ratio 15 9 - 20   Sodium 136 134 - 144 mmol/L   Potassium 3.4 (L) 3.5 - 5.2 mmol/L   Chloride 92 (L) 96 - 106 mmol/L   CO2 26 20 - 29 mmol/L   Calcium 9.1 8.7 - 10.2 mg/dL   Total Protein 7.4 6.0 - 8.5 g/dL   Albumin  4.3 3.8 - 4.9 g/dL   Globulin, Total 3.1 1.5 - 4.5 g/dL   Albumin/Globulin Ratio 1.4 1.2 - 2.2   Bilirubin Total 0.8 0.0 - 1.2 mg/dL   Alkaline Phosphatase 93 39 - 117 IU/L   AST 27 0 - 40 IU/L  ALT 22 0 - 44 IU/L      Assessment & Plan:   Problem List Items Addressed This Visit      Cardiovascular and Mediastinum   Uncontrolled hypertension    Chronic, ongoing with improvement.   Continue current medication regimen and continue cutting back on alcohol and smoking.  Continue collaboration with cardiology.  Will trial ED medication as needed, but to discontinue if any side effects present.  CMP today.  Return in 3 months for follow-up in office.      Relevant Medications   sildenafil (VIAGRA) 50 MG tablet   Aortic atherosclerosis ([place])    This was noticed on November 2020 lung CT.  No current statin or ASA (refuses).  Will continue to recommend initiation of statin and ASA + complete cessation of smoking.  Lipid panel today.      Relevant Medications   sildenafil (VIAGRA) 50 MG tablet     Respiratory   COPD (chronic obstructive pulmonary disease) (HCC)    Chronic, progressive, stable at this time.  Continue current inhaler regimen and adjust as needed.  Plan on spirometry in future once Covid pandemic improves.  Recommend complete cessation of smoking.        Other   Alcohol dependence ([place])    Has been cutting back, praised for this and recommended continue cut back.  He refuses rehab or therapy.  Is aware of effect of heavy alcohol use on overall health, especially BP.      Erectile dysfunction    Secondary to HTN, at this time BP under better control.  Will trial a low dose of Viagra and educated patient on this.  Made him aware of risks/benefits.  Aware to stop use if any major side effects present.  Script sent #5 pills only.       Other Visit Diagnoses    Preoperative clearance    -  Primary   EKG normal sinus with rate 70, BP under better control at this time.   Clearance form signed.   Relevant Orders   EKG 12-Lead (Completed)       Follow up plan: Return for as scheduled in June.

## 2020-03-22 NOTE — Assessment & Plan Note (Addendum)
This was noticed on November 2020 lung CT.  No current statin or ASA (refuses).  Will continue to recommend initiation of statin and ASA + complete cessation of smoking.  Lipid panel today. 

## 2020-03-22 NOTE — Assessment & Plan Note (Signed)
Chronic, progressive, stable at this time.  Continue current inhaler regimen and adjust as needed.  Plan on spirometry in future once Covid pandemic improves.  Recommend complete cessation of smoking.

## 2020-03-22 NOTE — Patient Instructions (Signed)
Coping with Quitting Smoking  Quitting smoking is a physical and mental challenge. You will face cravings, withdrawal symptoms, and temptation. Before quitting, work with your health care provider to make a plan that can help you cope. Preparation can help you quit and keep you from giving in. How can I cope with cravings? Cravings usually last for 5-10 minutes. If you get through it, the craving will pass. Consider taking the following actions to help you cope with cravings:  Keep your mouth busy: ? Chew sugar-free gum. ? Suck on hard candies or a straw. ? Brush your teeth.  Keep your hands and body busy: ? Immediately change to a different activity when you feel a craving. ? Squeeze or play with a ball. ? Do an activity or a hobby, like making bead jewelry, practicing needlepoint, or working with wood. ? Mix up your normal routine. ? Take a short exercise break. Go for a quick walk or run up and down stairs. ? Spend time in public places where smoking is not allowed.  Focus on doing something kind or helpful for someone else.  Call a friend or family member to talk during a craving.  Join a support group.  Call a quit line, such as 1-800-QUIT-NOW.  Talk with your health care provider about medicines that might help you cope with cravings and make quitting easier for you. How can I deal with withdrawal symptoms? Your body may experience negative effects as it tries to get used to not having nicotine in the system. These effects are called withdrawal symptoms. They may include:  Feeling hungrier than normal.  Trouble concentrating.  Irritability.  Trouble sleeping.  Feeling depressed.  Restlessness and agitation.  Craving a cigarette. To manage withdrawal symptoms:  Avoid places, people, and activities that trigger your cravings.  Remember why you want to quit.  Get plenty of sleep.  Avoid coffee and other caffeinated drinks. These may worsen some of your  symptoms. How can I handle social situations? Social situations can be difficult when you are quitting smoking, especially in the first few weeks. To manage this, you can:  Avoid parties, bars, and other social situations where people might be smoking.  Avoid alcohol.  Leave right away if you have the urge to smoke.  Explain to your family and friends that you are quitting smoking. Ask for understanding and support.  Plan activities with friends or family where smoking is not an option. What are some ways I can cope with stress? Wanting to smoke may cause stress, and stress can make you want to smoke. Find ways to manage your stress. Relaxation techniques can help. For example:  Breathe slowly and deeply, in through your nose and out through your mouth.  Listen to soothing, relaxing music.  Talk with a family member or friend about your stress.  Light a candle.  Soak in a bath or take a shower.  Think about a peaceful place. What are some ways I can prevent weight gain? Be aware that many people gain weight after they quit smoking. However, not everyone does. To keep from gaining weight, have a plan in place before you quit and stick to the plan after you quit. Your plan should include:  Having healthy snacks. When you have a craving, it may help to: ? Eat plain popcorn, crunchy carrots, celery, or other cut vegetables. ? Chew sugar-free gum.  Changing how you eat: ? Eat small portion sizes at meals. ? Eat 4-6 small meals   throughout the day instead of 1-2 large meals a day. ? Be mindful when you eat. Do not watch television or do other things that might distract you as you eat.  Exercising regularly: ? Make time to exercise each day. If you do not have time for a long workout, do short bouts of exercise for 5-10 minutes several times a day. ? Do some form of strengthening exercise, like weight lifting, and some form of aerobic exercise, like running or swimming.  Drinking  plenty of water or other low-calorie or no-calorie drinks. Drink 6-8 glasses of water daily, or as much as instructed by your health care provider. Summary  Quitting smoking is a physical and mental challenge. You will face cravings, withdrawal symptoms, and temptation to smoke again. Preparation can help you as you go through these challenges.  You can cope with cravings by keeping your mouth busy (such as by chewing gum), keeping your body and hands busy, and making calls to family, friends, or a helpline for people who want to quit smoking.  You can cope with withdrawal symptoms by avoiding places where people smoke, avoiding drinks with caffeine, and getting plenty of rest.  Ask your health care provider about the different ways to prevent weight gain, avoid stress, and handle social situations. This information is not intended to replace advice given to you by your health care provider. Make sure you discuss any questions you have with your health care provider. Document Revised: 10/17/2017 Document Reviewed: 11/01/2016 Elsevier Patient Education  2020 [employer]  

## 2020-03-23 LAB — CBC WITH DIFFERENTIAL/PLATELET
Basophils Absolute: 0 10*3/uL (ref 0.0–0.2)
Basos: 0 %
EOS (ABSOLUTE): 0.1 10*3/uL (ref 0.0–0.4)
Eos: 1 %
Hematocrit: 47.8 % (ref 37.5–51.0)
Hemoglobin: 16.2 g/dL (ref 13.0–17.7)
Immature Grans (Abs): 0 10*3/uL (ref 0.0–0.1)
Immature Granulocytes: 0 %
Lymphocytes Absolute: 1.6 10*3/uL (ref 0.7–3.1)
Lymphs: 23 %
MCH: 31.2 pg (ref 26.6–33.0)
MCHC: 33.9 g/dL (ref 31.5–35.7)
MCV: 92 fL (ref 79–97)
Monocytes Absolute: 0.9 10*3/uL (ref 0.1–0.9)
Monocytes: 13 %
Neutrophils Absolute: 4.4 10*3/uL (ref 1.4–7.0)
Neutrophils: 63 %
Platelets: 278 10*3/uL (ref 150–450)
RBC: 5.2 x10E6/uL (ref 4.14–5.80)
RDW: 13.4 % (ref 11.6–15.4)
WBC: 7.1 10*3/uL (ref 3.4–10.8)

## 2020-03-23 LAB — COMPREHENSIVE METABOLIC PANEL
ALT: 18 IU/L (ref 0–44)
AST: 25 IU/L (ref 0–40)
Albumin/Globulin Ratio: 1.3 (ref 1.2–2.2)
Albumin: 3.9 g/dL (ref 3.8–4.9)
Alkaline Phosphatase: 100 IU/L (ref 39–117)
BUN/Creatinine Ratio: 13 (ref 9–20)
BUN: 11 mg/dL (ref 6–24)
Bilirubin Total: 0.4 mg/dL (ref 0.0–1.2)
CO2: 22 mmol/L (ref 20–29)
Calcium: 8.8 mg/dL (ref 8.7–10.2)
Chloride: 102 mmol/L (ref 96–106)
Creatinine, Ser: 0.82 mg/dL (ref 0.76–1.27)
GFR calc Af Amer: 114 mL/min/{1.73_m2} (ref >59)
GFR calc non Af Amer: 99 mL/min/{1.73_m2} (ref >59)
Globulin, Total: 3.1 g/dL (ref 1.5–4.5)
Glucose: 96 mg/dL (ref 65–99)
Potassium: 4.3 mmol/L (ref 3.5–5.2)
Sodium: 138 mmol/L (ref 134–144)
Total Protein: 7 g/dL (ref 6.0–8.5)

## 2020-03-23 NOTE — Progress Notes (Signed)
Contacted via [place] afternoon [facility], your labs have returned and they look great!!  These are the best labs I have seen from you.  Great job!!  Have a great day!! Keep being awesome!! Kindest regards, Jaeveon [place]

## 2020-03-24 ENCOUNTER — Other Ambulatory Visit
Admission: RE | Admit: 2020-03-24 | Discharge: 2020-03-24 | Disposition: A | Discharge status: Home / Self Care | Payer: Medicaid Other | Source: Ambulatory Visit | Attending: Ophthalmology | Admitting: Ophthalmology

## 2020-03-24 DIAGNOSIS — Z01812 Encounter for preprocedural laboratory examination: Secondary | ICD-10-CM | POA: Diagnosis present

## 2020-03-24 DIAGNOSIS — Z20822 Contact with and (suspected) exposure to covid-19: Secondary | ICD-10-CM | POA: Insufficient documentation

## 2020-03-24 LAB — SARS CORONAVIRUS 2 (TAT 6-24 HRS): SARS Coronavirus 2: NEGATIVE

## 2020-03-24 NOTE — Discharge Instructions (Signed)

## 2020-03-28 ENCOUNTER — Ambulatory Visit: Payer: Medicaid Other | Admitting: Anesthesiology

## 2020-03-28 ENCOUNTER — Ambulatory Visit
Admission: RE | Admit: 2020-03-28 | Discharge: 2020-03-28 | Disposition: A | Discharge status: Home / Self Care | Payer: Medicaid Other | Attending: Ophthalmology | Admitting: Ophthalmology

## 2020-03-28 ENCOUNTER — Ambulatory Visit: Payer: Medicaid Other | Admitting: Cardiology

## 2020-03-28 ENCOUNTER — Other Ambulatory Visit: Payer: Self-pay

## 2020-03-28 ENCOUNTER — Encounter
Admission: RE | Disposition: A | Discharge status: Home / Self Care | Payer: Self-pay | Source: Home / Self Care | Attending: Ophthalmology

## 2020-03-28 DIAGNOSIS — K219 Gastro-esophageal reflux disease without esophagitis: Secondary | ICD-10-CM | POA: Insufficient documentation

## 2020-03-28 DIAGNOSIS — N400 Enlarged prostate without lower urinary tract symptoms: Secondary | ICD-10-CM | POA: Insufficient documentation

## 2020-03-28 DIAGNOSIS — F17200 Nicotine dependence, unspecified, uncomplicated: Secondary | ICD-10-CM | POA: Diagnosis not present

## 2020-03-28 DIAGNOSIS — I10 Essential (primary) hypertension: Secondary | ICD-10-CM | POA: Diagnosis not present

## 2020-03-28 DIAGNOSIS — Z79899 Other long term (current) drug therapy: Secondary | ICD-10-CM | POA: Insufficient documentation

## 2020-03-28 DIAGNOSIS — H2512 Age-related nuclear cataract, left eye: Secondary | ICD-10-CM | POA: Insufficient documentation

## 2020-03-28 DIAGNOSIS — J449 Chronic obstructive pulmonary disease, unspecified: Secondary | ICD-10-CM | POA: Insufficient documentation

## 2020-03-28 HISTORY — DX: Personal history of other diseases of the digestive system: Z87.19

## 2020-03-28 HISTORY — PX: CATARACT EXTRACTION W/PHACO: SHX586

## 2020-03-28 HISTORY — DX: Cervical disc disorder, unspecified, unspecified cervical region: M50.90

## 2020-03-28 HISTORY — DX: Dyspnea, unspecified: R06.00

## 2020-03-28 SURGERY — PHACOEMULSIFICATION, CATARACT, WITH IOL INSERTION
Anesthesia: Monitor Anesthesia Care | Site: Eye | Laterality: Left

## 2020-03-28 MED ORDER — MOXIFLOXACIN HCL 0.5 % OP SOLN
OPHTHALMIC | Status: DC | PRN
  Administered 2020-03-28: 0.2 mL via OPHTHALMIC

## 2020-03-28 MED ORDER — ARMC OPHTHALMIC DILATING DROPS
1.0000 "application " | OPHTHALMIC | Status: DC | PRN
  Administered 2020-03-28 (×3): 1 via OPHTHALMIC

## 2020-03-28 MED ORDER — TETRACAINE HCL 0.5 % OP SOLN
1.0000 [drp] | OPHTHALMIC | Status: DC | PRN
  Administered 2020-03-28 (×3): 1 [drp] via OPHTHALMIC

## 2020-03-28 MED ORDER — BRIMONIDINE TARTRATE-TIMOLOL 0.2-0.5 % OP SOLN
OPHTHALMIC | Status: DC | PRN
  Administered 2020-03-28: 1 [drp] via OPHTHALMIC

## 2020-03-28 MED ORDER — MIDAZOLAM HCL 2 MG/2ML IJ SOLN
INTRAMUSCULAR | Status: DC | PRN
  Administered 2020-03-28 (×2): 1 mg via INTRAVENOUS

## 2020-03-28 MED ORDER — LIDOCAINE HCL (PF) 2 % IJ SOLN
INTRAOCULAR | Status: DC | PRN
  Administered 2020-03-28: 2 mL

## 2020-03-28 MED ORDER — FENTANYL CITRATE (PF) 100 MCG/2ML IJ SOLN
INTRAMUSCULAR | Status: DC | PRN
  Administered 2020-03-28 (×2): 50 ug via INTRAVENOUS

## 2020-03-28 MED ORDER — NA CHONDROIT SULF-NA HYALURON 40-17 MG/ML IO SOLN
INTRAOCULAR | Status: DC | PRN
  Administered 2020-03-28: 1 mL via INTRAOCULAR

## 2020-03-28 MED ORDER — EPINEPHRINE PF 1 MG/ML IJ SOLN
INTRAOCULAR | Status: DC | PRN
  Administered 2020-03-28: 13:00:00 62 mL via OPHTHALMIC

## 2020-03-28 SURGICAL SUPPLY — 22 items
CANNULA ANT/CHMB 27G (MISCELLANEOUS) ×2 IMPLANT
CANNULA ANT/CHMB 27GA (MISCELLANEOUS) ×6 IMPLANT
GLOVE SURG LX 8.0 MICRO (GLOVE) ×2
GLOVE SURG LX STRL 8.0 MICRO (GLOVE) ×1 IMPLANT
GLOVE SURG TRIUMPH 8.0 PF LTX (GLOVE) ×3 IMPLANT
GOWN STRL REUS W/ TWL LRG LVL3 (GOWN DISPOSABLE) ×2 IMPLANT
GOWN STRL REUS W/TWL LRG LVL3 (GOWN DISPOSABLE) ×4
LENS IOL DIOP 19.0 (Intraocular Lens) ×3 IMPLANT
LENS IOL TECNIS MONO 19.0 (Intraocular Lens) IMPLANT
MARKER SKIN DUAL TIP RULER LAB (MISCELLANEOUS) ×3 IMPLANT
NDL FILTER BLUNT 18X1 1/2 (NEEDLE) ×1 IMPLANT
NEEDLE FILTER BLUNT 18X 1/2SAF (NEEDLE) ×2
NEEDLE FILTER BLUNT 18X1 1/2 (NEEDLE) ×1 IMPLANT
PACK EYE AFTER SURG (MISCELLANEOUS) ×3 IMPLANT
PACK OPTHALMIC (MISCELLANEOUS) ×3 IMPLANT
PACK PORFILIO (MISCELLANEOUS) ×3 IMPLANT
SUT ETHILON 10-0 CS-B-6CS-B-6 (SUTURE) ×3
SUTURE EHLN 10-0 CS-B-6CS-B-6 (SUTURE) IMPLANT
SYR 3ML LL SCALE MARK (SYRINGE) ×3 IMPLANT
SYR TB 1ML LUER SLIP (SYRINGE) ×3 IMPLANT
WATER STERILE IRR 250ML POUR (IV SOLUTION) ×3 IMPLANT
WIPE NON LINTING 3.25X3.25 (MISCELLANEOUS) ×3 IMPLANT

## 2020-03-28 NOTE — Anesthesia Postprocedure Evaluation (Signed)
Anesthesia Post Note  Patient: Luke Briggs.  Procedure(s) Performed: CATARACT EXTRACTION PHACO AND INTRAOCULAR LENS PLACEMENT ([place]) LEFT MALYUGIN  MILOOP,   5.29  00:41.0 (Left Eye)     Anesthesia Post Evaluation  Akiva Josey

## 2020-03-28 NOTE — Transfer of Care (Signed)
Immediate Anesthesia Transfer of Care Note  Patient: Luke Briggs.  Procedure(s) Performed: CATARACT EXTRACTION PHACO AND INTRAOCULAR LENS PLACEMENT ([place]) LEFT MALYUGIN  MILOOP,   5.29  00:41.0 (Left Eye)  Patient Location: PACU  Anesthesia Type: MAC  Level of Consciousness: awake, alert  and patient cooperative  Airway and Oxygen Therapy: Patient Spontanous Breathing and Patient connected to supplemental oxygen  Post-op Assessment: Post-op Vital signs reviewed, Patient's Cardiovascular Status Stable, Respiratory Function Stable, Patent Airway and No signs of Nausea or vomiting  Post-op Vital Signs: Reviewed and stable  Complications: No apparent anesthesia complications

## 2020-03-28 NOTE — Anesthesia Preprocedure Evaluation (Signed)
Anesthesia Evaluation  Patient identified by MRN, date of birth, ID band Patient awake    Reviewed: Allergy & Precautions, NPO status , Patient's Chart, lab work & pertinent test results  Airway Mallampati: II  TM Distance: >3 FB Neck ROM: Full    Dental no notable dental hx.    Pulmonary COPD, Current Smoker and Patient abstained from smoking.,    Pulmonary exam normal breath sounds clear to auscultation       Cardiovascular hypertension, Normal cardiovascular exam Rhythm:Regular Rate:Normal     Neuro/Psych PSYCHIATRIC DISORDERS Anxiety Schizophrenia    GI/Hepatic GERD  Medicated and Controlled,  Endo/Other  negative endocrine ROS  Renal/GU   negative genitourinary   Musculoskeletal  (+) Arthritis ,   Abdominal Normal abdominal exam  (+)   Peds  Hematology negative hematology ROS (+)   Anesthesia Other Findings   Reproductive/Obstetrics negative OB ROS                             Anesthesia Physical Anesthesia Plan  ASA: II  Anesthesia Plan: MAC   Post-op Pain Management:    Induction: Intravenous  PONV Risk Score and Plan: 0 and TIVA, Midazolam and Treatment may vary due to age or medical condition  Airway Management Planned: Natural Airway and Nasal Cannula  Additional Equipment:   Intra-op Plan:   Post-operative Plan:   Informed Consent: I have reviewed the patients History and Physical, chart, labs and discussed the procedure including the risks, benefits and alternatives for the proposed anesthesia with the patient or authorized representative who has indicated his/her understanding and acceptance.     Dental advisory given  Plan Discussed with: CRNA  Anesthesia Plan Comments:         Anesthesia Quick Evaluation

## 2020-03-28 NOTE — Op Note (Signed)
PREOPERATIVE DIAGNOSIS:  Nuclear sclerotic cataract of the left eye.   POSTOPERATIVE DIAGNOSIS:  Nuclear sclerotic cataract of the left eye.   OPERATIVE PROCEDURE: Procedure(s): CATARACT EXTRACTION PHACO AND INTRAOCULAR LENS PLACEMENT ([place]) LEFT MALYUGIN  MILOOP,   5.29  00:41.0   SURGEON:  Birder Robson, MD.   ANESTHESIA:  Anesthesiologist: Ardeth Sportsman, MD CRNA: Cameron Ali, CRNA  1.      Managed anesthesia care. 2.     0.30ml of Shugarcaine was instilled following the paracentesis   COMPLICATIONS:  None.   TECHNIQUE:   Stop and chop   DESCRIPTION OF PROCEDURE:  The patient was examined and consented in the preoperative holding area where the aforementioned topical anesthesia was applied to the left eye and then brought back to the Operating Room where the left eye was prepped and draped in the usual sterile ophthalmic fashion and a lid speculum was placed. A paracentesis was created with the side port blade and the anterior chamber was filled with viscoelastic. A near clear corneal incision was performed with the steel keratome. A continuous curvilinear capsulorrhexis was performed with a cystotome followed by the capsulorrhexis forceps. Hydrodissection and hydrodelineation were carried out with BSS on a blunt cannula. The lens was removed in a stop and chop  technique and the remaining cortical material was removed with the irrigation-aspiration handpiece. The capsular bag was inflated with viscoelastic and the Technis [id number] lens was placed in the capsular bag without complication. The remaining viscoelastic was removed from the eye with the irrigation-aspiration handpiece. The wounds were hydrated. The anterior chamber was flushed with Miostat and the eye was inflated to physiologic pressure. 0.25ml Vigamox was placed in the anterior chamber. The wounds were found to be water tight. The eye was dressed with Vigamox. The patient was given protective glasses to wear throughout the day and a  shield with which to sleep tonight. The patient was also given drops with which to begin a drop regimen today and will follow-up with me in one day. Implant Name Type Inv. Item Serial No. Manufacturer Lot No. LRB No. Used Action  LENS IOL DIOP 19.0 - [id number] Intraocular Lens LENS IOL DIOP 19.0 [id number] AMO  Left 1 Implanted    Procedure(s): CATARACT EXTRACTION PHACO AND INTRAOCULAR LENS PLACEMENT ([place]) LEFT MALYUGIN  MILOOP,   5.29  00:41.0 (Left)  Electronically signed: Birder Robson 03/28/2020 12:41 PM

## 2020-03-28 NOTE — H&P (Signed)
All labs reviewed. Abnormal studies sent to patients PCP when indicated.  Previous H&P reviewed, patient examined, there are NO CHANGES.  Luke Helbling Porfilio5/11/202112:07 PM

## 2020-03-28 NOTE — Anesthesia Procedure Notes (Signed)
Procedure Name: MAC Date/Time: 03/28/2020 12:12 PM Performed by: Cameron Ali, CRNA Pre-anesthesia Checklist: Patient identified, Emergency Drugs available, Suction available, Timeout performed and Patient being monitored Patient Re-evaluated:Patient Re-evaluated prior to induction Oxygen Delivery Method: Nasal cannula Placement Confirmation: positive ETCO2

## 2020-03-29 ENCOUNTER — Encounter: Payer: Self-pay | Admitting: *Deleted

## 2020-03-30 ENCOUNTER — Emergency Department
Admission: EM | Admit: 2020-03-30 | Discharge: 2020-03-30 | Disposition: A | Discharge status: Home / Self Care | Payer: Medicaid Other | Attending: Emergency Medicine | Admitting: Emergency Medicine

## 2020-03-30 ENCOUNTER — Encounter: Payer: Self-pay | Admitting: Emergency Medicine

## 2020-03-30 ENCOUNTER — Emergency Department: Payer: Medicaid Other

## 2020-03-30 ENCOUNTER — Telehealth: Payer: Self-pay | Admitting: Cardiology

## 2020-03-30 ENCOUNTER — Other Ambulatory Visit: Payer: Self-pay

## 2020-03-30 ENCOUNTER — Telehealth: Payer: Self-pay | Admitting: Nurse Practitioner

## 2020-03-30 ENCOUNTER — Emergency Department
Admission: EM | Admit: 2020-03-30 | Discharge: 2020-03-30 | Disposition: A | Discharge status: Home / Self Care | Payer: Medicaid Other | Source: Home / Self Care | Attending: Emergency Medicine | Admitting: Emergency Medicine

## 2020-03-30 DIAGNOSIS — Z5321 Procedure and treatment not carried out due to patient leaving prior to being seen by health care provider: Secondary | ICD-10-CM | POA: Diagnosis not present

## 2020-03-30 DIAGNOSIS — I10 Essential (primary) hypertension: Secondary | ICD-10-CM | POA: Insufficient documentation

## 2020-03-30 DIAGNOSIS — R0602 Shortness of breath: Secondary | ICD-10-CM

## 2020-03-30 DIAGNOSIS — R42 Dizziness and giddiness: Secondary | ICD-10-CM | POA: Insufficient documentation

## 2020-03-30 DIAGNOSIS — R531 Weakness: Secondary | ICD-10-CM | POA: Insufficient documentation

## 2020-03-30 DIAGNOSIS — J449 Chronic obstructive pulmonary disease, unspecified: Secondary | ICD-10-CM | POA: Insufficient documentation

## 2020-03-30 DIAGNOSIS — F17210 Nicotine dependence, cigarettes, uncomplicated: Secondary | ICD-10-CM | POA: Insufficient documentation

## 2020-03-30 DIAGNOSIS — Z79899 Other long term (current) drug therapy: Secondary | ICD-10-CM | POA: Insufficient documentation

## 2020-03-30 LAB — URINALYSIS, COMPLETE (UACMP) WITH MICROSCOPIC
Bacteria, UA: NONE SEEN
Bilirubin Urine: NEGATIVE
Glucose, UA: NEGATIVE mg/dL
Hgb urine dipstick: NEGATIVE
Ketones, ur: NEGATIVE mg/dL
Leukocytes,Ua: NEGATIVE
Nitrite: NEGATIVE
Protein, ur: NEGATIVE mg/dL
Specific Gravity, Urine: 1.008 (ref 1.005–1.030)
Squamous Epithelial / HPF: NONE SEEN (ref 0–5)
pH: 6 (ref 5.0–8.0)

## 2020-03-30 LAB — BASIC METABOLIC PANEL
Anion gap: 8 (ref 5–15)
BUN: 14 mg/dL (ref 6–20)
CO2: 24 mmol/L (ref 22–32)
Calcium: 8.6 mg/dL — ABNORMAL LOW (ref 8.9–10.3)
Chloride: 104 mmol/L (ref 98–111)
Creatinine, Ser: 0.82 mg/dL (ref 0.61–1.24)
GFR calc Af Amer: >60 mL/min (ref >60)
GFR calc non Af Amer: >60 mL/min (ref >60)
Glucose, Bld: 110 mg/dL — ABNORMAL HIGH (ref 70–99)
Potassium: 4 mmol/L (ref 3.5–5.1)
Sodium: 136 mmol/L (ref 135–145)

## 2020-03-30 LAB — CBC
HCT: 46.4 % (ref 39.0–52.0)
Hemoglobin: 15.8 g/dL (ref 13.0–17.0)
MCH: 31.2 pg (ref 26.0–34.0)
MCHC: 34.1 g/dL (ref 30.0–36.0)
MCV: 91.5 fL (ref 80.0–100.0)
Platelets: 302 10*3/uL (ref 150–400)
RBC: 5.07 MIL/uL (ref 4.22–5.81)
RDW: 13.7 % (ref 11.5–15.5)
WBC: 7.7 10*3/uL (ref 4.0–10.5)
nRBC: 0 % (ref 0.0–0.2)

## 2020-03-30 LAB — TROPONIN I (HIGH SENSITIVITY): Troponin I (High Sensitivity): 4 ng/L (ref <18)

## 2020-03-30 IMAGING — CR DG CHEST 2V
1 series · 2 of 2 positions shown · non-contrast
Comparison: [DATE]

CLINICAL DATA: Shortness of breath and chest pain. Dizziness and
weakness. Two months duration.

EXAM:
CHEST - 2 VIEW

[Series 1: dg chest 2 view · 0.14mm/px · 2 of 2 slices shown]
[im 1/2]
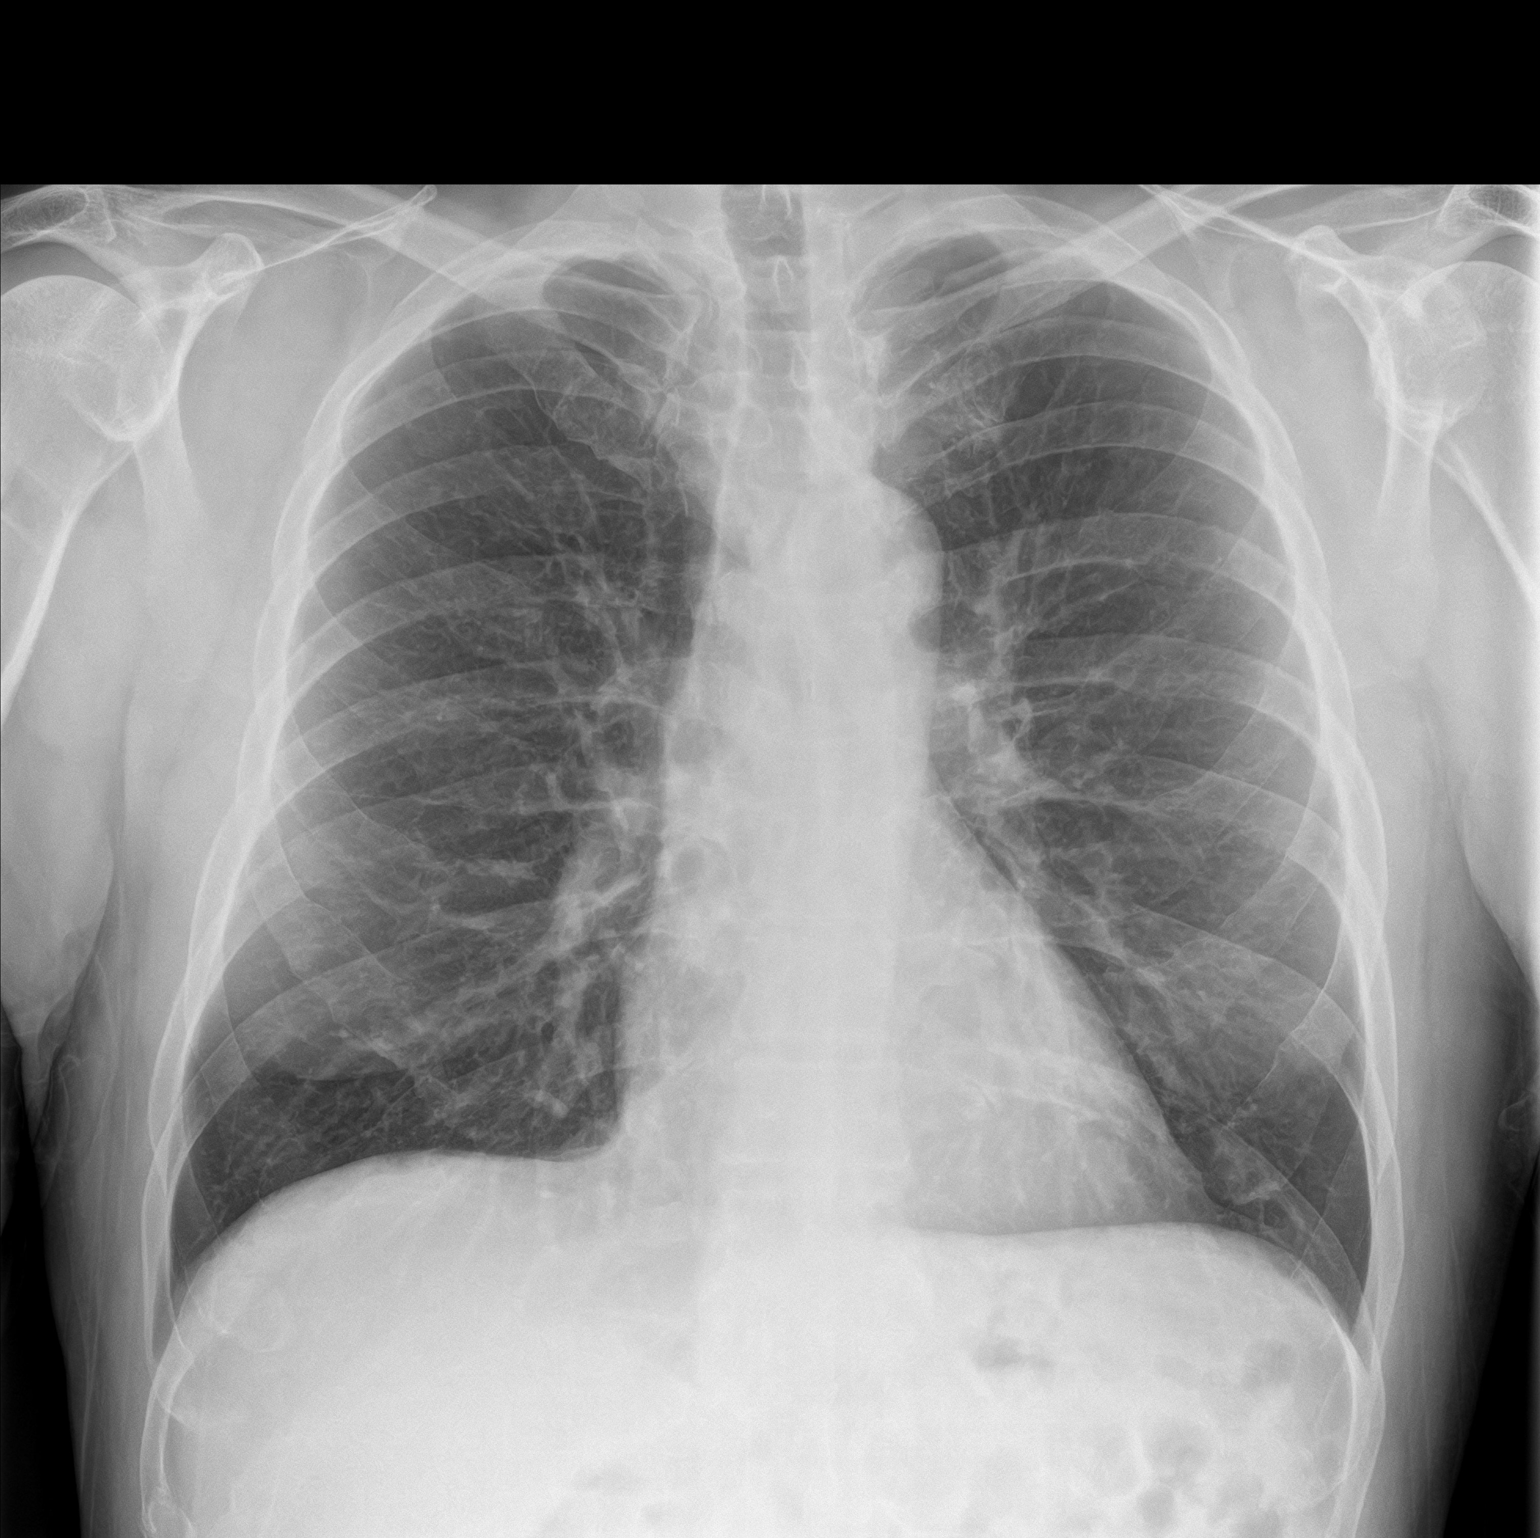
[im 2/2]
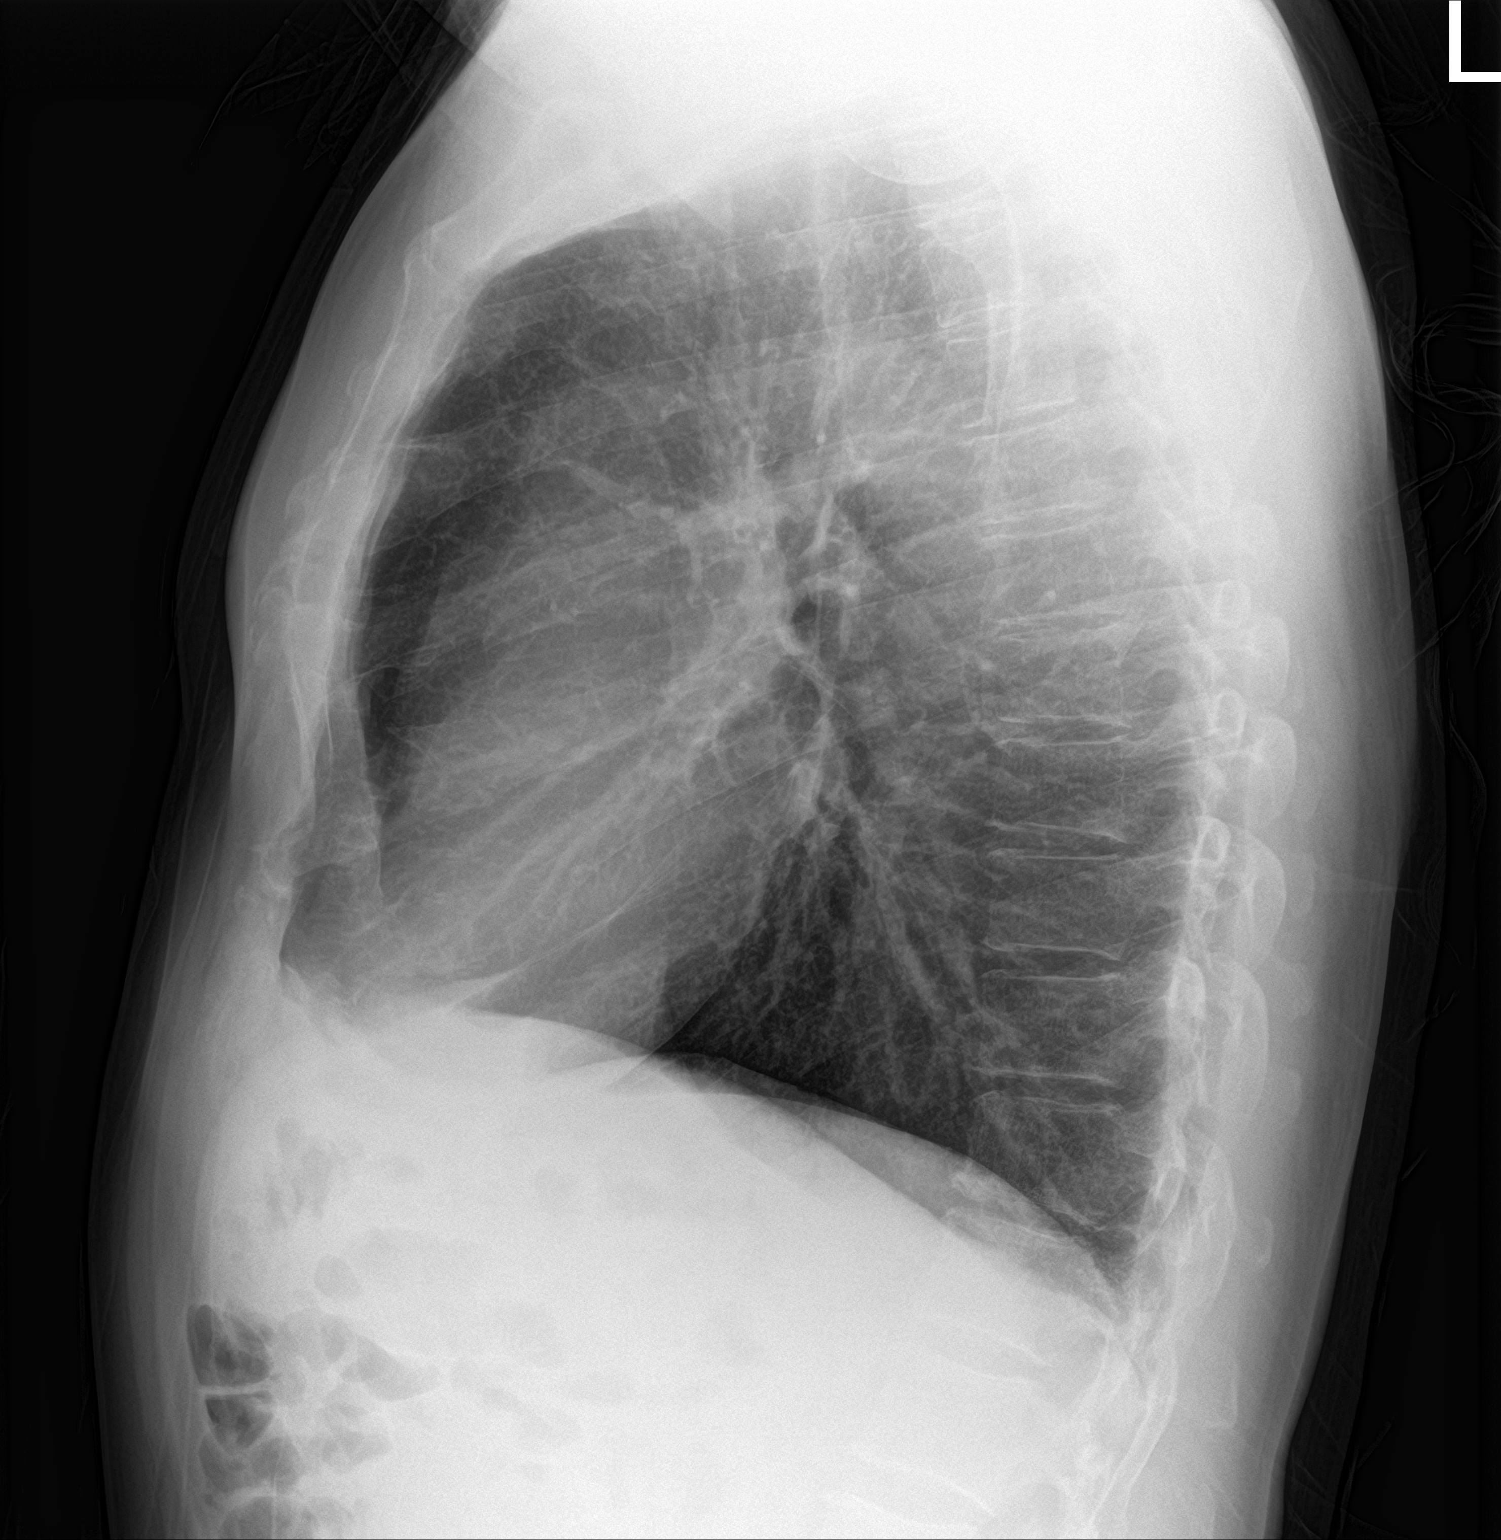

[2 of 2 positions shown; findings below may reference images not displayed]

FINDINGS: Heart size is normal. Mediastinal shadows are normal. The lungs are
clear. No bronchial thickening. No infiltrate, mass, effusion or
collapse. Pulmonary vascularity is normal. No bony abnormality.
IMPRESSION: Normal chest

## 2020-03-30 MED ORDER — CARVEDILOL 3.125 MG PO TABS
3.1250 mg | ORAL_TABLET | Freq: Two times a day (BID) | ORAL | 3 refills | Status: DC
Start: 2020-03-30 — End: 2020-07-03

## 2020-03-30 MED ORDER — SODIUM CHLORIDE 0.9% FLUSH: 3.0000 mL | Freq: Once | INTRAVENOUS | Status: DC

## 2020-03-30 NOTE — Telephone Encounter (Signed)
Copied from [place] [phone]. Topic: General - Other >> Mar 30, 2020  2:01 PM Keene Breath wrote: Reason for CRM: Patient called and said he was cut off after talking to someone in the office.  Patient would like a call back from the nurse.  CB# [phone]

## 2020-03-30 NOTE — ED Notes (Signed)
Full rainbow lab tubes drawn by this tech.

## 2020-03-30 NOTE — ED Provider Notes (Signed)
[facility] Emergency Department Provider Note ____________________________________________   First MD Initiated Contact with Patient 03/30/20 1720     (approximate)  I have reviewed the triage vital signs and the nursing notes.   HISTORY  Chief Complaint Weakness    HPI Luke Briggs. is a 59 y.o. male with PMH as noted below who presents with multiple symptoms, gradual onset over the last 1.5 months.  The patient states that he did not get checked out previously because he was concerned that his evaluation for the symptoms might delay cataract surgery that he had recently.  The patient reports generalized weakness, worse with exertion, and associated with some shortness of breath.  He also reports dizziness when he is up and about, which is more of a movement sensation but also consists of lightheadedness.  He denies any vomiting or diarrhea.  He has no cough or fever he denies any chest pain.  He has no vision changes.  The patient states that he was started on carvedilol about 5 months ago and the dose was gradually increased.  He states that his doctor thinks that this may be the cause, and the dose was adjusted downward today.  Past Medical History:  Diagnosis Date  . Angioedema 08/07/2019  . Arthritis    knees,shoulders, ankles, most joints  . Cervical disc disorder    difficuly moving neck left  . COPD (chronic obstructive pulmonary disease) (HCC)    chronic cough and wheezing  . Dyspnea    easily  . GERD (gastroesophageal reflux disease)   . Hemorrhoids   . History of hiatal hernia   . Hypertension    controlled on meds  . Pneumonia 04/2019  . Prostate disorder   . PTSD (post-traumatic stress disorder)   . Schizophrenia ([place])    Per patient's report.    Patient Active Problem List   Diagnosis Date Noted  . Erectile dysfunction 03/22/2020  . Aortic atherosclerosis ([place]) 09/29/2019  . Bilateral primary osteoarthritis of knee  07/01/2019  . Primary osteoarthritis of right knee 07/01/2019  . Primary osteoarthritis of left knee 07/01/2019  . COPD (chronic obstructive pulmonary disease) ([place]) 06/17/2019  . Controlled substance agreement signed 05/05/2019  . Chronic pain syndrome 04/28/2019  . BPH (benign prostatic hyperplasia) 04/28/2019  . Eczema 04/28/2019  . Uncontrolled hypertension 04/28/2019  . GERD (gastroesophageal reflux disease) 04/28/2019  . Schizophrenia ([place]) 04/28/2019  . Chronic post-traumatic stress disorder (PTSD) 04/28/2019  . Hemorrhoids 04/28/2019  . Nicotine dependence, cigarettes, w unsp disorders 04/28/2019  . Alcohol dependence ([place]) 04/28/2019    Past Surgical History:  Procedure Laterality Date  . CATARACT EXTRACTION W/PHACO Left 03/28/2020   Procedure: CATARACT EXTRACTION PHACO AND INTRAOCULAR LENS PLACEMENT ([place]) LEFT MALYUGIN  MILOOP,   5.29  00:41.0;  Surgeon: Birder Robson, MD;  Location: [place];  Service: Ophthalmology;  Laterality: Left;  . EYE SURGERY Left    injury from a nail  . FOOT SURGERY Left     Prior to Admission medications   Medication Sig Start Date End Date Taking? Authorizing Provider  albuterol (VENTOLIN HFA) 108 (90 Base) MCG/ACT inhaler Inhale 2 puffs into the lungs every 6 (six) hours as needed for wheezing or shortness of breath. 05/31/19   Duffy Bruce, MD  carvedilol (COREG) 3.125 MG tablet Take 1 tablet (3.125 mg total) by mouth 2 (two) times daily. 03/30/20 06/28/20  Kate Sable, MD  EPINEPHrine 0.3 mg/0.3 mL IJ SOAJ injection Inject 0.3 mLs (0.3 mg  total) into the muscle as needed for anaphylaxis. 08/09/19   Nicholes Mango, MD  fentaNYL (DURAGESIC) 25 MCG/HR Place 1 patch onto the skin every other day.     [provider]  finasteride (PROSCAR) 5 MG tablet Take 1 tablet (5 mg total) by mouth daily. 11/24/19   [name], [name], NP  hydrALAZINE (APRESOLINE) 100 MG tablet Take 1 tablet (100 mg total) by mouth 3 (three) times  daily. 11/29/19 03/28/20  Kate Sable, MD  hydrocortisone (ANUSOL-HC) 2.5 % rectal cream Place 1 application rectally 2 (two) times daily. Patient taking differently: Place 1 application rectally 2 (two) times daily as needed.  07/29/19   [name], [name], NP  Ipratropium-Albuterol (COMBIVENT) 20-100 MCG/ACT AERS respimat Inhale 1 puff into the lungs every 6 (six) hours. 06/15/19   Johnn Hai, PA-C  Magnesium 500 MG TABS Take by mouth every other day.    [provider]  montelukast (SINGULAIR) 10 MG tablet Take 1 tablet (10 mg total) by mouth at bedtime. 12/24/19   [name], [name], NP  omeprazole (PRILOSEC) 40 MG capsule Take 1 capsule (40 mg total) by mouth daily. 11/24/19   [name], [name], NP  Potassium 99 MG TABS Take 1 tablet by mouth every other day.     [provider]  sildenafil (VIAGRA) 50 MG tablet Take 1 tablet (50 mg total) by mouth daily as needed for erectile dysfunction. 03/22/20   [name], [name], NP  tamsulosin (FLOMAX) 0.4 MG CAPS capsule Take 1 capsule (0.4 mg total) by mouth daily. 11/24/19   [name], [name], NP  Tiotropium Bromide-Olodaterol (STIOLTO RESPIMAT) 2.5-2.5 MCG/ACT AERS Inhale into the lungs every 6 (six) hours.    [provider]  triamcinolone cream (KENALOG) 0.1 % Apply 1 application topically 2 (two) times daily. 02/18/20   Marnee Guarneri T, NP    Allergies Lisinopril, Amlodipine, and Penicillins  Family History  Problem Relation Age of Onset  . Heart disease Mother   . Osteoporosis Mother   . Heart disease Father   . Emphysema Father   . Heart disease Sister   . Emphysema Maternal Grandmother   . Heart disease Maternal Grandfather   . Osteoporosis Sister     Social History Social History   Tobacco Use  . Smoking status: Current Every Day Smoker    Packs/day: 1.00    Years: 43.00    Pack years: 43.00    Types: Cigarettes  . Smokeless tobacco: Never Used  Substance Use Topics  . Alcohol use: Yes     Alcohol/week: 8.0 standard drinks    Types: 8 Cans of beer per week    Comment: beer  . Drug use: Never    Review of Systems  Constitutional: No fever/chills.  Positive generalized weakness. Eyes: No visual changes. ENT: No sore throat. Cardiovascular: Denies chest pain. Respiratory: Positive for shortness of breath. Gastrointestinal: No vomiting or diarrhea.  Genitourinary: Negative for dysuria.  Musculoskeletal: Negative for back pain. Skin: Negative for rash. Neurological: Negative for headache.   ____________________________________________   PHYSICAL EXAM:  VITAL SIGNS: ED Triage Vitals  Enc Vitals Group     BP 03/30/20 1439 (!) 159/101     Pulse Rate 03/30/20 1439 71     Resp 03/30/20 1439 18     Temp 03/30/20 1439 98 F (36.7 C)     Temp Source 03/30/20 1439 Oral     SpO2 03/30/20 1439 94 %     Weight 03/30/20 1441 185 lb (  83.9 kg)     Height 03/30/20 1441 5\' 10"  (1.778 m)     Head Circumference --      Peak Flow --      Pain Score 03/30/20 1728 0     Pain Loc --      Pain Edu? --      Excl. in [place]? --     Constitutional: Alert and oriented. Well appearing and in no acute distress. Eyes: Conjunctivae are normal.  Head: Atraumatic. Nose: No congestion/rhinnorhea. Mouth/Throat: Mucous membranes are moist.   Neck: Normal range of motion.  Cardiovascular: Normal rate, regular rhythm. Grossly normal heart sounds.  Good peripheral circulation. Respiratory: Normal respiratory effort.  No retractions. Lungs CTAB. Gastrointestinal: No distention.  Musculoskeletal: Extremities warm and well perfused.  Neurologic:  Normal speech and language. No gross focal neurologic deficits are appreciated.  Skin:  Skin is warm and dry. No rash noted. Psychiatric: Mood and affect are normal. Speech and behavior are normal.  ____________________________________________   LABS (all labs ordered are listed, but only abnormal results are displayed)  Labs Reviewed    URINALYSIS, COMPLETE (UACMP) WITH MICROSCOPIC - Abnormal; Notable for the following components:      Result Value   Color, Urine STRAW (*)    APPearance CLEAR (*)    All other components within normal limits   ____________________________________________  EKG  ED ECG REPORT I, Arta Silence, the attending physician, personally viewed and interpreted this ECG.  Date: 03/30/2020 EKG Time: 1443 Rate: 72 Rhythm: normal sinus rhythm QRS Axis: normal Intervals: normal ST/T Wave abnormalities: normal Narrative Interpretation: no evidence of acute ischemia  ____________________________________________  RADIOLOGY    ____________________________________________   PROCEDURES  Procedure(s) performed: No  Procedures  Critical Care performed: No ____________________________________________   INITIAL IMPRESSION / ASSESSMENT AND PLAN / ED COURSE  Pertinent labs & imaging results that were available during my care of the patient were reviewed by me and considered in my medical decision making (see chart for details).  59 year old male with PMH as noted above presents with multiple symptoms over the last 1 to 2 months, all gradual onset with no acute change today.  The patient states that his doctor believes it may be related to his carvedilol, and decrease the dose today.  Specifically, the patient reports dizziness, shortness of breath, and generalized weakness.  On exam, he is overall well-appearing.  His vital signs are normal except for hypertension.  The physical exam is otherwise unremarkable.  The lungs are clear.  Neurologic exam is nonfocal.  His EKG shows no acute abnormalities.  The patient actually came to the ED earlier today, had labs drawn, but then left before being seen.  I reviewed the lab work-up.  BMP, CBC, and troponin are all within normal limits.  Urinalysis was obtained and is also negative.  I overall suspect that the most likely etiology is a side  effect of the patient's blood pressure medication, or possible other medications.  Differential also includes subacute etiologies such as hypothyroidism or chronic CNS cause. Given that the initial work-up is negative, the patient is stable, and the symptoms have lasted for more than 1 month, there is no indication for further emergent work-up in the ED.  I discussed the results with the patient.  He feels comfortable going home, and at this time is stable for discharge.  I instructed him to follow-up with his PMD in the next 1 to 2 weeks.  If his symptoms persist after he stabilizes  on the new carvedilol dose, he may need further work-up as an outpatient.  I gave him thorough return precautions, and he expressed understanding.    ____________________________________________   FINAL CLINICAL IMPRESSION(S) / ED DIAGNOSES  Final diagnoses:  Dizziness  Shortness of breath      NEW MEDICATIONS STARTED DURING THIS VISIT:  New Prescriptions   CARVEDILOL (COREG) 3.125 MG TABLET    Take 1 tablet (3.125 mg total) by mouth 2 (two) times daily.     Note:  This document was prepared using Dragon voice recognition software and may include unintentional dictation errors.   Arta Silence, MD 03/30/20 [phone]

## 2020-03-30 NOTE — Telephone Encounter (Signed)
Patient states that this was from the office of his PCP [name]. Advised that I would send message back to them for review.

## 2020-03-30 NOTE — Discharge Instructions (Addendum)
Follow-up with your primary care provider within the next 1 to 2 weeks.  You should monitor your symptoms after the dose of your blood pressure medicine was decreased.  If you are still having the dizziness, weakness, and shortness of breath in 1 to 2 weeks, your doctor should consider ordering an MRI or other imaging of your brain as well as a thyroid function test and possible other blood work.  In the meantime, return to the emergency department immediately for new, worsening, or persistent severe shortness of breath, dizziness, weakness, or any other new or worsening symptoms that concern you.

## 2020-03-30 NOTE — Telephone Encounter (Signed)
Called pt, he is wanting to speak with someone in regards to his lab work that he had in office.

## 2020-03-30 NOTE — Telephone Encounter (Signed)
Spoke with patient and he is already taking that dose of carvedilol 6.25 mg twice daily. Will let Dr. Garen Lah know for further recommendations.

## 2020-03-30 NOTE — Telephone Encounter (Signed)
Kate Sable, MD  You 2 minutes ago (4:53 PM)   Patient can decrease Coreg further to 3.125 mg twice daily. Thank you   Routing comment    Spoke with patient and reviewed recommendations to decrease carvedilol further down to 3.125 mg twice daily. He verbalized understanding with no further questions at this time.

## 2020-03-30 NOTE — ED Triage Notes (Addendum)
C/O 1 month history of feeling dizzy, weakness, and ringing in ears.  States was tested for COVID on 5/7, negative-- just prior to cataract surgery.  States his BP medication was changed about two months ago, and has not been feeling well since changing medications.  Also c/o intermittent CP and SOB.  Patient is AAOx3.  Skin warm and dry.  No SOB/ DOE.  Sinus congestion noted.

## 2020-03-30 NOTE — ED Notes (Signed)
Did not redraw blood; pt was here earlier today and LWBS.

## 2020-03-30 NOTE — Telephone Encounter (Signed)
Pt c/o medication issue:  1. Name of Medication: new blood pressure medications ( Hydralazine and coreg )   2. How are you currently taking this medication (dosage and times per day)? ?  3. Are you having a reaction (difficulty breathing--STAT)? Ears ringing head ache palpitations sob weak dizzy   4. What is your medication issue? New medication reaction cannot tolerate and wants to try something else   Patient left ed without being seen today due to wait time

## 2020-03-30 NOTE — ED Triage Notes (Signed)
Pt from home with dizziness, weakness, ringing in ears. Pt states he has had this problem x 1.5 months and that he didn't come in before because he didn't want anything to interfere with his cataract surgery that was recently completed. Pt states he came earlier today and left because the wait was too long. Pt alert & oriented, NAD noted.

## 2020-03-30 NOTE — ED Notes (Signed)
Pt unable to sign for d/c due to inaccessibility to signature pad. Pt verbalized understanding of DC paperwork with no further questions at this time.

## 2020-03-30 NOTE — Telephone Encounter (Signed)
Patient notified of what Luke Briggs said regarding lab results a verbalized understanding.

## 2020-03-30 NOTE — Telephone Encounter (Signed)
Spoke with patient and he reports that since starting the carvedilol and hydralazine he developed ears ringing, headaches, palpitations, shortness of breath, weakness, and dizziness. Inquired if he was hydrated well and he states that he has been drinking plenty of water. Advised that I would make provider aware and we would call him back with any further recommendations.

## 2020-03-31 ENCOUNTER — Telehealth: Payer: Self-pay | Admitting: Nurse Practitioner

## 2020-03-31 ENCOUNTER — Telehealth: Payer: Self-pay | Admitting: Cardiology

## 2020-03-31 NOTE — Telephone Encounter (Signed)
Pt called stated magnesium is not working for RLS -PT went to ED for dizziness. PT request another RX for RLS PT did not mention anything to ER Dr about RLS [place] [place]

## 2020-03-31 NOTE — Telephone Encounter (Signed)
Spoke with the patient. Patient sts that he believes that he has an intolerance to Hydralazine. He was seen in the ED yesterday and also called the office with the same complaint of headache, sob, and pounding in the chest. His Carvedilol was reduced to 3.125 mg bid.  Patient sts that his BP has been elevated for several weeks and had not been well controlled in his current medication regimen. Patient is unable to provide any readings, he just sts "its been high."  He took his Hydralazine this morning and had the same symptoms that prompted his ED visit yesterday. He is unable to provide any VS. Adv the patient that I will fwd the update to Dr. Garen Lah and call back with his recommendation.[name]

## 2020-03-31 NOTE — Telephone Encounter (Signed)
Pt c/o medication issue:  1. Name of Medication: hydralazine   2. How are you currently taking this medication (dosage and times per day)? 100 MG 1 tablet 3 times daily  3. Are you having a reaction (difficulty breathing--STAT)? SOB, head and heart pounding   4. What is your medication issue? Patient has been having these issues since starting the medication.  Has been to the ED and they state this medication may be the cause.  Please call to discuss.

## 2020-04-03 NOTE — Telephone Encounter (Signed)
Luke Sable, MD  You 3 days ago   Start clonidine 0.1 mg twice daily. Thank you    Called to give the patient Dr. Thereasa Solo recommendation. lmtcb.

## 2020-04-03 NOTE — Telephone Encounter (Signed)
Patient returning call.

## 2020-04-03 NOTE — Telephone Encounter (Signed)
Called the patient. lmtcb. 

## 2020-04-04 MED ORDER — CLONIDINE HCL 0.1 MG PO TABS: 0.1000 mg | ORAL_TABLET | Freq: Two times a day (BID) | ORAL | 5 refills | Status: DC

## 2020-04-04 NOTE — Telephone Encounter (Signed)
  Kate Sable, MD  You 3 days ago   Start clonidine 0.1 mg twice daily. Thank you    Patient made aware of Dr. Thereasa Solo recommendation. Patient is in agreement with starting Clonidine. Rx for Clonidine 0.1mg  bid sent to the patients pharmacy.  Patient has a 04/11/20 appt with Dr. Garen Lah. Adv the patient to monitor his BP and HR daily for the next week and to bring his reading with him to his appt. Patient verbalized understanding.

## 2020-04-05 ENCOUNTER — Ambulatory Visit: Payer: Medicaid Other | Admitting: Nurse Practitioner

## 2020-04-11 ENCOUNTER — Other Ambulatory Visit: Payer: Self-pay

## 2020-04-11 ENCOUNTER — Ambulatory Visit (INDEPENDENT_AMBULATORY_CARE_PROVIDER_SITE_OTHER): Payer: Medicaid Other | Admitting: Cardiology

## 2020-04-11 ENCOUNTER — Encounter: Payer: Self-pay | Admitting: Cardiology

## 2020-04-11 VITALS — BP 170/100 | HR 62 | Ht 70.0 in | Wt 188.0 lb

## 2020-04-11 DIAGNOSIS — I10 Essential (primary) hypertension: Secondary | ICD-10-CM | POA: Diagnosis not present

## 2020-04-11 DIAGNOSIS — F17200 Nicotine dependence, unspecified, uncomplicated: Secondary | ICD-10-CM | POA: Diagnosis not present

## 2020-04-11 DIAGNOSIS — IMO0001 Reserved for inherently not codable concepts without codable children: Secondary | ICD-10-CM

## 2020-04-11 DIAGNOSIS — F1010 Alcohol abuse, uncomplicated: Secondary | ICD-10-CM

## 2020-04-11 MED ORDER — CLONIDINE HCL 0.2 MG PO TABS: 0.2000 mg | ORAL_TABLET | Freq: Two times a day (BID) | ORAL | 3 refills | Status: DC

## 2020-04-11 NOTE — Progress Notes (Signed)
Cardiology Office Note:    Date:  04/11/2020   ID:  Luke Pollen., DOB July 13, 1961, MRN [id number]  PCP:  [name], NP  Cardiologist:  Kate Sable, MD  Electrophysiologist:  None   Referring MD: [name], NP   Chief Complaint  Patient presents with  . Other    2 month follow up. patient states he is feeling better since being off of the Hydralazine. Meds reviewed verbally with patient.     History of Present Illness:    Luke Briggs. is a 59 y.o. 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  He was last seen due to elevated blood pressures.  Patient has intolerance to multiple medications as noted in the historical notes below.  After last visit hydralazine was started and titrated.  He states being intolerant to hydralazine and hence it was stopped.  He complained of chest pounding, shortness of breath and headaches while on hydralazine which has improved since stopping.  Clonidine 0.1 mg twice daily was started and patient is tolerating okay.[name]  His Coreg was reduced to 3.125 twice daily because he states this was also causing him to have symptoms.  Patient still drinks has cut back tremendously, currently drinks 6 beers on the weekends.  He used to drink over 24 beers a day.  Historical notes Patient has  poorly controlled blood pressures for years.  He has tried several medications but stopped due to adverse effects.  Lisinopril caused angioedema, carvedilol caused nausea/vomiting, amlodipine caused severe dizziness.  He has been drinking heavily for the past 18 months after losing his fiance.  He drinks 10-15 beers daily.  He states he has been trying to cut back.  He also is a current smoker for over 40 years.  Patient has also tried chlorthalidone but was found to have hyponatremia and hypokalemia.  At the time, he was seen in emergency room  for vomiting.  Blood pressure was noted to be elevated at 170/98.  He was started on hydralazine but later on stopped because hydralazine causing shortness of breath, pounding in his chest..      Past Medical History:  Diagnosis Date  . Angioedema 08/07/2019  . Arthritis    knees,shoulders, ankles, most joints  . Cervical disc disorder    difficuly moving neck left  . COPD (chronic obstructive pulmonary disease) (HCC)    chronic cough and wheezing  . Dyspnea    easily  . GERD (gastroesophageal reflux disease)   . Hemorrhoids   . History of hiatal hernia   . Hypertension    controlled on meds  . Pneumonia 04/2019  . Prostate disorder   . PTSD (post-traumatic stress disorder)   . Schizophrenia ([place])    Per patient's report.    Past Surgical History:  Procedure Laterality Date  . CATARACT EXTRACTION W/PHACO Left 03/28/2020   Procedure: CATARACT EXTRACTION PHACO AND INTRAOCULAR LENS PLACEMENT ([place]) LEFT MALYUGIN  MILOOP,   5.29  00:41.0;  Surgeon: Birder Robson, MD;  Location: [facility];  Service: Ophthalmology;  Laterality: Left;  . EYE SURGERY Left    injury from a nail  . FOOT SURGERY Left     Current Medications: Current Meds  Medication Sig  . albuterol (VENTOLIN HFA) 108 (90 Base) MCG/ACT inhaler Inhale 2 puffs into the lungs every 6 (six) hours as needed for wheezing or shortness of  breath.  . carvedilol (COREG) 3.125 MG tablet Take 1 tablet (3.125 mg total) by mouth 2 (two) times daily.  [name] EPINEPHrine 0.3 mg/0.3 mL IJ SOAJ injection Inject 0.3 mLs (0.3 mg total) into the muscle as needed for anaphylaxis.  . fentaNYL (DURAGESIC) 25 MCG/HR Place 1 patch onto the skin every other day.   . finasteride (PROSCAR) 5 MG tablet Take 1 tablet (5 mg total) by mouth daily.  . hydrocortisone (ANUSOL-HC) 2.5 % rectal cream Place 1 application rectally 2 (two) times daily. (Patient taking differently: Place 1 application rectally 2 (two) times daily as needed. )  .  Ipratropium-Albuterol (COMBIVENT) 20-100 MCG/ACT AERS respimat Inhale 1 puff into the lungs every 6 (six) hours.  . Magnesium 500 MG TABS Take by mouth every other day.  . montelukast (SINGULAIR) 10 MG tablet Take 1 tablet (10 mg total) by mouth at bedtime.  [name] omeprazole (PRILOSEC) 40 MG capsule Take 1 capsule (40 mg total) by mouth daily.  . Potassium 99 MG TABS Take 1 tablet by mouth every other day.   . sildenafil (VIAGRA) 50 MG tablet Take 1 tablet (50 mg total) by mouth daily as needed for erectile dysfunction.  . tamsulosin (FLOMAX) 0.4 MG CAPS capsule Take 1 capsule (0.4 mg total) by mouth daily.  . Tiotropium Bromide-Olodaterol (STIOLTO RESPIMAT) 2.5-2.5 MCG/ACT AERS Inhale into the lungs every 6 (six) hours.  . triamcinolone cream (KENALOG) 0.1 % Apply 1 application topically 2 (two) times daily.  . [DISCONTINUED] cloNIDine (CATAPRES) 0.1 MG tablet Take 1 tablet (0.1 mg total) by mouth 2 (two) times daily.     Allergies:   Lisinopril, Amlodipine, and Penicillins   Social History   Socioeconomic History  . Marital status: Significant Other    Spouse name: Not on file  . Number of children: Not on file  . Years of education: Not on file  . Highest education level: Not on file  Occupational History  . Not on file  Tobacco Use  . Smoking status: Current Every Day Smoker    Packs/day: 1.00    Years: 43.00    Pack years: 43.00    Types: Cigarettes  . Smokeless tobacco: Never Used  Substance and Sexual Activity  . Alcohol use: Yes    Alcohol/week: 8.0 standard drinks    Types: 8 Cans of beer per week    Comment: beer  . Drug use: Never  . Sexual activity: Not on file  Other Topics Concern  . Not on file  Social History Narrative   Patient has new girlfriend they have been dating almost a year.    Social Determinants of Health   Financial Resource Strain: Low Risk   . Difficulty of Paying Living Expenses: Not very hard  Food Insecurity: No Food Insecurity  . Worried  About Charity fundraiser in the Last Year: Never true  . Ran Out of Food in the Last Year: Never true  Transportation Needs: Unmet Transportation Needs  . Lack of Transportation (Medical): Yes  . Lack of Transportation (Non-Medical): Yes  Physical Activity: Inactive  . Days of Exercise per Week: 0 days  . Minutes of Exercise per Session: 0 min  Stress:   . Feeling of Stress :   Social Connections: Unknown  . Frequency of Communication with Friends and Family: More than three times a week  . Frequency of Social Gatherings with Friends and Family: Not on file  . Attends Religious Services: Not on file  . Active Member  of Clubs or Organizations: Not on file  . Attends Archivist Meetings: Not on file  . Marital Status: Living with partner     Family History: The patient's family history includes Emphysema in his father and maternal grandmother; Heart disease in his father, maternal grandfather, mother, and sister; Osteoporosis in his mother and sister.  ROS:   Please see the history of present illness.     All other systems reviewed and are negative.  EKGs/Labs/Other Studies Reviewed:    The following studies were reviewed today:  TTE 11/18/2019 1. Left ventricular ejection fraction, by visual estimation, is 60 to 65%. The left ventricle has normal function. There is no left ventricular hypertrophy.  2. The left ventricle has no regional wall motion abnormalities.  3. Global right ventricle has normal systolic function.The right ventricular size is normal. No increase in right ventricular wall thickness.  4. Left atrial size was normal.  5. Right atrial size was normal.  6. The mitral valve is normal in structure. No evidence of mitral valve regurgitation. No evidence of mitral stenosis.  7. The tricuspid valve is normal in structure.  8. The aortic valve was not well visualized. Aortic valve regurgitation is not visualized. No evidence of aortic valve sclerosis or  stenosis.  9. The pulmonic valve was not well visualized. Pulmonic valve regurgitation is not visualized. 10. Normal pulmonary artery systolic pressure. 11. The inferior vena cava is normal in size with greater than 50% respiratory variability, suggesting right atrial pressure of 3 mmHg.  EKG:  EKG is  ordered today.  The ekg ordered today demonstrates normal sinus rhythm, normal ECG.  Recent Labs: 07/06/2019: B Natriuretic Peptide 19.0 10/07/2019: Magnesium 1.8; TSH 0.750 03/22/2020: ALT 18 03/30/2020: BUN 14; Creatinine, Ser 0.82; Hemoglobin 15.8; Platelets 302; Potassium 4.0; Sodium 136  Recent Lipid Panel    Component Value Date/Time   CHOL 171 03/22/2020 1128   TRIG 173 (H) 03/22/2020 1128   HDL 56 05/05/2019 1424   VLDL 35 (H) 03/22/2020 1128   LDLCALC 86 05/05/2019 1424    Physical Exam:    VS:  BP (!) 170/100 (BP Location: Left Arm, Patient Position: Sitting, Cuff Size: Normal)   Pulse 62   Ht 5\' 10"  (1.778 m)   Wt 188 lb (85.3 kg)   SpO2 98%   BMI 26.98 kg/m     Wt Readings from Last 3 Encounters:  04/11/20 188 lb (85.3 kg)  03/30/20 185 lb (83.9 kg)  03/30/20 186 lb 1.1 oz (84.4 kg)     GEN:  Well nourished, well developed in no acute distress HEENT: Normal NECK: No JVD; No carotid bruits LYMPHATICS: No lymphadenopathy CARDIAC: RRR, no murmurs, rubs, gallops RESPIRATORY:  Clear to auscultation without rales, wheezing or rhonchi  ABDOMEN: Soft, non-tender, non-distended MUSCULOSKELETAL:  No edema; No deformity  SKIN: Warm and dry NEUROLOGIC:  Alert and oriented x 3 PSYCHIATRIC:  Normal affect   ASSESSMENT:    1. Hypertension, unspecified type   2. Smoking   3. ETOH abuse    PLAN:    In order of problems listed above:  1. Blood pressure still not controlled.  Continue Coreg 3.125 twice daily, increase clonidine to 0.2 mg twice daily.  Plan to increase clonidine further at follow-up if patient still hypertensive. 2. Current smoker for over 40 years.   Smoking cessation advised. 3. History of alcohol use, likely contributing to difficult to control blood pressures.  EtOH cessation advised.   Follow-up in 1 month  This note was generated in part or whole with voice recognition software. Voice recognition is usually quite accurate but there are transcription errors that can and very often do occur. I apologize for any typographical errors that were not detected and corrected.  Medication Adjustments/Labs and Tests Ordered: Current medicines are reviewed at length with the patient today.  Concerns regarding medicines are outlined above.  Orders Placed This Encounter  Procedures  . EKG 12-Lead   Meds ordered this encounter  Medications  . cloNIDine (CATAPRES) 0.2 MG tablet    Sig: Take 1 tablet (0.2 mg total) by mouth 2 (two) times daily.    Dispense:  60 tablet    Refill:  3    Patient Instructions  Medication Instructions:  Your physician has recommended you make the following change in your medication:  Increase your Clonidine to 0.2mg , take 1 tablet by mouth twice a day.   *If you need a refill on your cardiac medications before your next appointment, please call your pharmacy*   Lab Work: None Ordered If you have labs (blood work) drawn today and your tests are completely normal, you will receive your results only by: [name] MyChart Message (if you have MyChart) OR . A paper copy in the mail If you have any lab test that is abnormal or we need to change your treatment, we will call you to review the results.   Testing/Procedures: None Ordered   Follow-Up: At [facility], you and your health needs are our priority.  As part of our continuing mission to provide you with exceptional heart care, we have created designated Provider Care Teams.  These Care Teams include your primary Cardiologist (physician) and Advanced Practice Providers (APPs -  Physician Assistants and Nurse Practitioners) who all work together to provide you  with the care you need, when you need it.  We recommend signing up for the patient portal called "MyChart".  Sign up information is provided on this After Visit Summary.  MyChart is used to connect with patients for Virtual Visits (Telemedicine).  Patients are able to view lab/test results, encounter notes, upcoming appointments, etc.  Non-urgent messages can be sent to your provider as well.   To learn more about what you can do with MyChart, go to [email or web address].    Your next appointment:   1 month(s)  The format for your next appointment:   In Person  Provider:   Kate Sable, MD   Other Instructions N/A     Signed, Kate Sable, MD  04/11/2020 10:28 AM    [place]

## 2020-04-11 NOTE — Patient Instructions (Signed)
Medication Instructions:  Your physician has recommended you make the following change in your medication:  Increase your Clonidine to 0.2mg , take 1 tablet by mouth twice a day.   *If you need a refill on your cardiac medications before your next appointment, please call your pharmacy*   Lab Work: None Ordered If you have labs (blood work) drawn today and your tests are completely normal, you will receive your results only by: [name] MyChart Message (if you have MyChart) OR . A paper copy in the mail If you have any lab test that is abnormal or we need to change your treatment, we will call you to review the results.   Testing/Procedures: None Ordered   Follow-Up: At [facility], you and your health needs are our priority.  As part of our continuing mission to provide you with exceptional heart care, we have created designated Provider Care Teams.  These Care Teams include your primary Cardiologist (physician) and Advanced Practice Providers (APPs -  Physician Assistants and Nurse Practitioners) who all work together to provide you with the care you need, when you need it.  We recommend signing up for the patient portal called "MyChart".  Sign up information is provided on this After Visit Summary.  MyChart is used to connect with patients for Virtual Visits (Telemedicine).  Patients are able to view lab/test results, encounter notes, upcoming appointments, etc.  Non-urgent messages can be sent to your provider as well.   To learn more about what you can do with MyChart, go to [email or web address].    Your next appointment:   1 month(s)  The format for your next appointment:   In Person  Provider:   Kate Sable, MD   Other Instructions N/A

## 2020-05-18 ENCOUNTER — Other Ambulatory Visit: Payer: Self-pay

## 2020-05-18 ENCOUNTER — Ambulatory Visit (INDEPENDENT_AMBULATORY_CARE_PROVIDER_SITE_OTHER): Payer: Medicaid Other | Admitting: Cardiology

## 2020-05-18 ENCOUNTER — Encounter: Payer: Self-pay | Admitting: Cardiology

## 2020-05-18 VITALS — BP 140/98 | HR 72 | Ht 70.0 in | Wt 185.4 lb

## 2020-05-18 DIAGNOSIS — F1010 Alcohol abuse, uncomplicated: Secondary | ICD-10-CM

## 2020-05-18 DIAGNOSIS — I10 Essential (primary) hypertension: Secondary | ICD-10-CM

## 2020-05-18 DIAGNOSIS — F17200 Nicotine dependence, unspecified, uncomplicated: Secondary | ICD-10-CM

## 2020-05-18 DIAGNOSIS — IMO0001 Reserved for inherently not codable concepts without codable children: Secondary | ICD-10-CM

## 2020-05-18 MED ORDER — CLONIDINE HCL 0.2 MG PO TABS: 0.3000 mg | ORAL_TABLET | Freq: Two times a day (BID) | ORAL | 6 refills | Status: DC

## 2020-05-18 NOTE — Progress Notes (Signed)
Cardiology Office Note:    Date:  05/18/2020   ID:  [name]., DOB [birth year], MRN [id number]  PCP:  [name], NP  Cardiologist:  Kate Sable, MD  Electrophysiologist:  None   Referring MD: [name], NP   Chief Complaint  Patient presents with  . other    1 month follow up. Meds reviewed by the pt. verbally. Pt. c/o shortness of breath.     History of Present Illness:    Luke Briggs. is a 59 y.o. 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  Patient is being seen for BP control.  Clonidine was started and titrated after last visit.  He now presents for follow-up.  Has no complaints today.  States the clonidine is making him drink lots of water but otherwise doing okay.  He thinks his blood pressure is better.  Working on cutting back on both alcohol and tobacco use.  Historical notes Patient has  poorly controlled blood pressures for years.  He has tried several medications but stopped due to adverse effects.  Lisinopril caused angioedema, carvedilol caused nausea/vomiting, amlodipine caused severe dizziness.  He has been drinking heavily for the past 18 months after losing his fiance.  He drinks 10-15 beers daily.  He states he has been trying to cut back.  He also is a current smoker for over 40 years.  Patient has also tried chlorthalidone but was found to have hyponatremia and hypokalemia.  At the time, he was seen in emergency room for vomiting.  Blood pressure was noted to be elevated at 170/98.  He was started on hydralazine but later on stopped because hydralazine causing shortness of breath, pounding in his chest..      Past Medical History:  Diagnosis Date  . Angioedema 08/07/2019  . Arthritis    knees,shoulders, ankles, most joints  . Cervical disc disorder    difficuly moving neck left  . COPD (chronic obstructive  pulmonary disease) (HCC)    chronic cough and wheezing  . Dyspnea    easily  . GERD (gastroesophageal reflux disease)   . Hemorrhoids   . History of hiatal hernia   . Hypertension    controlled on meds  . Pneumonia 04/2019  . Prostate disorder   . PTSD (post-traumatic stress disorder)   . Schizophrenia ([place])    Per patient's report.    Past Surgical History:  Procedure Laterality Date  . CATARACT EXTRACTION W/PHACO Left 03/28/2020   Procedure: CATARACT EXTRACTION PHACO AND INTRAOCULAR LENS PLACEMENT ([place]) LEFT MALYUGIN  MILOOP,   5.29  00:41.0;  Surgeon: Birder Robson, MD;  Location: [facility];  Service: Ophthalmology;  Laterality: Left;  . EYE SURGERY Left    injury from a nail  . FOOT SURGERY Left     Current Medications: Current Meds  Medication Sig  . albuterol (VENTOLIN HFA) 108 (90 Base) MCG/ACT inhaler Inhale 2 puffs into the lungs every 6 (six) hours as needed for wheezing or shortness of breath.  . carvedilol (COREG) 3.125 MG tablet Take 1 tablet (3.125 mg total) by mouth 2 (two) times daily.  . cloNIDine (CATAPRES) 0.2 MG tablet Take 1.5 tablets (0.3 mg total) by mouth 2 (two) times daily.  [name] EPINEPHrine 0.3 mg/0.3 mL IJ SOAJ injection Inject 0.3 mLs (0.3 mg total) into the muscle as needed for anaphylaxis.  . fentaNYL (DURAGESIC) 25  MCG/HR Place 1 patch onto the skin every other day.   . finasteride (PROSCAR) 5 MG tablet Take 1 tablet (5 mg total) by mouth daily.  . hydrocortisone (ANUSOL-HC) 2.5 % rectal cream Place 1 application rectally 2 (two) times daily. (Patient taking differently: Place 1 application rectally 2 (two) times daily as needed. )  . Ipratropium-Albuterol (COMBIVENT) 20-100 MCG/ACT AERS respimat Inhale 1 puff into the lungs every 6 (six) hours.  . Magnesium 500 MG TABS Take by mouth every other day.  . montelukast (SINGULAIR) 10 MG tablet Take 1 tablet (10 mg total) by mouth at bedtime.  [name] omeprazole (PRILOSEC) 40 MG capsule Take 1  capsule (40 mg total) by mouth daily.  . Potassium 99 MG TABS Take 1 tablet by mouth every other day.   . sildenafil (VIAGRA) 50 MG tablet Take 1 tablet (50 mg total) by mouth daily as needed for erectile dysfunction.  . tamsulosin (FLOMAX) 0.4 MG CAPS capsule Take 1 capsule (0.4 mg total) by mouth daily.  . Tiotropium Bromide-Olodaterol (STIOLTO RESPIMAT) 2.5-2.5 MCG/ACT AERS Inhale into the lungs every 6 (six) hours.  . triamcinolone cream (KENALOG) 0.1 % Apply 1 application topically 2 (two) times daily.  . [DISCONTINUED] cloNIDine (CATAPRES) 0.2 MG tablet Take 1 tablet (0.2 mg total) by mouth 2 (two) times daily.     Allergies:   Lisinopril, Amlodipine, and Penicillins   Social History   Socioeconomic History  . Marital status: Significant Other    Spouse name: Not on file  . Number of children: Not on file  . Years of education: Not on file  . Highest education level: Not on file  Occupational History  . Not on file  Tobacco Use  . Smoking status: Current Every Day Smoker    Packs/day: 1.00    Years: 43.00    Pack years: 43.00    Types: Cigarettes  . Smokeless tobacco: Never Used  Vaping Use  . Vaping Use: Never used  Substance and Sexual Activity  . Alcohol use: Yes    Alcohol/week: 8.0 standard drinks    Types: 8 Cans of beer per week    Comment: beer  . Drug use: Never  . Sexual activity: Not on file  Other Topics Concern  . Not on file  Social History Narrative   Patient has new girlfriend they have been dating almost a year.    Social Determinants of Health   Financial Resource Strain: Low Risk   . Difficulty of Paying Living Expenses: Not very hard  Food Insecurity: No Food Insecurity  . Worried About Charity fundraiser in the Last Year: Never true  . Ran Out of Food in the Last Year: Never true  Transportation Needs: Unmet Transportation Needs  . Lack of Transportation (Medical): Yes  . Lack of Transportation (Non-Medical): Yes  Physical Activity:  Inactive  . Days of Exercise per Week: 0 days  . Minutes of Exercise per Session: 0 min  Stress:   . Feeling of Stress :   Social Connections: Unknown  . Frequency of Communication with Friends and Family: More than three times a week  . Frequency of Social Gatherings with Friends and Family: Not on file  . Attends Religious Services: Not on file  . Active Member of Clubs or Organizations: Not on file  . Attends Archivist Meetings: Not on file  . Marital Status: Living with partner     Family History: The patient's family history includes Emphysema in his  father and maternal grandmother; Heart disease in his father, maternal grandfather, mother, and sister; Osteoporosis in his mother and sister.  ROS:   Please see the history of present illness.     All other systems reviewed and are negative.  EKGs/Labs/Other Studies Reviewed:    The following studies were reviewed today:  TTE 11/18/2019 1. Left ventricular ejection fraction, by visual estimation, is 60 to 65%. The left ventricle has normal function. There is no left ventricular hypertrophy.  2. The left ventricle has no regional wall motion abnormalities.  3. Global right ventricle has normal systolic function.The right ventricular size is normal. No increase in right ventricular wall thickness.  4. Left atrial size was normal.  5. Right atrial size was normal.  6. The mitral valve is normal in structure. No evidence of mitral valve regurgitation. No evidence of mitral stenosis.  7. The tricuspid valve is normal in structure.  8. The aortic valve was not well visualized. Aortic valve regurgitation is not visualized. No evidence of aortic valve sclerosis or stenosis.  9. The pulmonic valve was not well visualized. Pulmonic valve regurgitation is not visualized. 10. Normal pulmonary artery systolic pressure. 11. The inferior vena cava is normal in size with greater than 50% respiratory variability, suggesting right  atrial pressure of 3 mmHg.  EKG:  EKG is  ordered today.  The ekg ordered today demonstrates normal sinus rhythm, normal ECG.  Recent Labs: 07/06/2019: B Natriuretic Peptide 19.0 10/07/2019: Magnesium 1.8; TSH 0.750 03/22/2020: ALT 18 03/30/2020: BUN 14; Creatinine, Ser 0.82; Hemoglobin 15.8; Platelets 302; Potassium 4.0; Sodium 136  Recent Lipid Panel    Component Value Date/Time   CHOL 171 03/22/2020 1128   TRIG 173 (H) 03/22/2020 1128   HDL 56 05/05/2019 1424   VLDL 35 (H) 03/22/2020 1128   LDLCALC 86 05/05/2019 1424    Physical Exam:    VS:  BP (!) 140/98 (BP Location: Left Arm, Patient Position: Sitting, Cuff Size: Normal)   Pulse 72   Ht 5\' 10"  (1.778 m)   Wt 185 lb 6 oz (84.1 kg)   SpO2 99%   BMI 26.60 kg/m     Wt Readings from Last 3 Encounters:  05/18/20 185 lb 6 oz (84.1 kg)  04/11/20 188 lb (85.3 kg)  03/30/20 185 lb (83.9 kg)     GEN:  Well nourished, well developed in no acute distress HEENT: Normal NECK: No JVD; No carotid bruits LYMPHATICS: No lymphadenopathy CARDIAC: RRR, no murmurs, rubs, gallops RESPIRATORY:  Clear to auscultation without rales, wheezing or rhonchi  ABDOMEN: Soft, non-tender, non-distended MUSCULOSKELETAL:  No edema; No deformity  SKIN: Warm and dry NEUROLOGIC:  Alert and oriented x 3 PSYCHIATRIC:  Normal affect   ASSESSMENT:    1. Hypertension, unspecified type   2. Smoking   3. ETOH abuse    PLAN:    In order of problems listed above:  1. Blood pressure is improved but still not controlled.  Continue Coreg 3.125 twice daily, increase clonidine to 0.3 mg twice daily.  Cutting back on alcohol advised as this will help his blood pressure. 2. Current smoker for over 40 years.  Smoking cessation advised. 3. History of alcohol use, most likely contributing to elevated blood pressures.  EtOH cessation advised.   Follow-up in 3 months  This note was generated in part or whole with voice recognition software. Voice recognition is  usually quite accurate but there are transcription errors that can and very often do occur. I apologize  for any typographical errors that were not detected and corrected.  Medication Adjustments/Labs and Tests Ordered: Current medicines are reviewed at length with the patient today.  Concerns regarding medicines are outlined above.  Orders Placed This Encounter  Procedures  . EKG 12-Lead   Meds ordered this encounter  Medications  . cloNIDine (CATAPRES) 0.2 MG tablet    Sig: Take 1.5 tablets (0.3 mg total) by mouth 2 (two) times daily.    Dispense:  60 tablet    Refill:  6    Patient Instructions  Medication Instructions:   Your physician has recommended you make the following change in your medication:   INCREASE your Clonidine: Take 1.5 tablets (0.3 mg total) by mouth 2 (two) times daily   *If you need a refill on your cardiac medications before your next appointment, please call your pharmacy*   Lab Work: None Ordered If you have labs (blood work) drawn today and your tests are completely normal, you will receive your results only by: [name] MyChart Message (if you have MyChart) OR . A paper copy in the mail If you have any lab test that is abnormal or we need to change your treatment, we will call you to review the results.   Testing/Procedures: None Ordered   Follow-Up: At [facility], you and your health needs are our priority.  As part of our continuing mission to provide you with exceptional heart care, we have created designated Provider Care Teams.  These Care Teams include your primary Cardiologist (physician) and Advanced Practice Providers (APPs -  Physician Assistants and Nurse Practitioners) who all work together to provide you with the care you need, when you need it.  We recommend signing up for the patient portal called "MyChart".  Sign up information is provided on this After Visit Summary.  MyChart is used to connect with patients for Virtual Visits  (Telemedicine).  Patients are able to view lab/test results, encounter notes, upcoming appointments, etc.  Non-urgent messages can be sent to your provider as well.   To learn more about what you can do with MyChart, go to [email or web address].    Your next appointment:   3 month(s)  The format for your next appointment:   In Person  Provider:   Kate Sable, MD   Other Instructions  N/A     Signed, Kate Sable, MD  05/18/2020 12:36 PM    [place]

## 2020-05-18 NOTE — Patient Instructions (Signed)
Medication Instructions:   Your physician has recommended you make the following change in your medication:   INCREASE your Clonidine: Take 1.5 tablets (0.3 mg total) by mouth 2 (two) times daily   *If you need a refill on your cardiac medications before your next appointment, please call your pharmacy*   Lab Work: None Ordered If you have labs (blood work) drawn today and your tests are completely normal, you will receive your results only by: [name] MyChart Message (if you have MyChart) OR . A paper copy in the mail If you have any lab test that is abnormal or we need to change your treatment, we will call you to review the results.   Testing/Procedures: None Ordered   Follow-Up: At [facility], you and your health needs are our priority.  As part of our continuing mission to provide you with exceptional heart care, we have created designated Provider Care Teams.  These Care Teams include your primary Cardiologist (physician) and Advanced Practice Providers (APPs -  Physician Assistants and Nurse Practitioners) who all work together to provide you with the care you need, when you need it.  We recommend signing up for the patient portal called "MyChart".  Sign up information is provided on this After Visit Summary.  MyChart is used to connect with patients for Virtual Visits (Telemedicine).  Patients are able to view lab/test results, encounter notes, upcoming appointments, etc.  Non-urgent messages can be sent to your provider as well.   To learn more about what you can do with MyChart, go to [email or web address].    Your next appointment:   3 month(s)  The format for your next appointment:   In Person  Provider:   Kate Sable, MD   Other Instructions  N/A

## 2020-05-26 ENCOUNTER — Other Ambulatory Visit: Payer: Self-pay | Admitting: Nurse Practitioner

## 2020-05-26 MED ORDER — SILDENAFIL CITRATE 50 MG PO TABS: 50.0000 mg | ORAL_TABLET | Freq: Every day | ORAL | 0 refills | Status: DC | PRN

## 2020-05-26 NOTE — Telephone Encounter (Signed)
RX REFILL sildenafil (VIAGRA) 50 MG tablet  [place], [place] - [place] Phone:  [phone]  Fax:  [phone]

## 2020-06-08 ENCOUNTER — Other Ambulatory Visit: Payer: Self-pay | Admitting: Nurse Practitioner

## 2020-06-08 MED ORDER — SILDENAFIL CITRATE 50 MG PO TABS: 50.0000 mg | ORAL_TABLET | Freq: Every day | ORAL | 0 refills | Status: DC | PRN

## 2020-06-08 NOTE — Telephone Encounter (Signed)
Copied from [employer] [id number]. Topic: Quick Communication - Rx Refill/Question >> Jun 08, 2020 12:21 PM Leward Quan A wrote: Medication: sildenafil (VIAGRA) 50 MG tablet   Has the patient contacted their pharmacy? Yes.   (Agent: If no, request that the patient contact the pharmacy for the refill.) (Agent: If yes, when and what did the pharmacy advise?)  Preferred Pharmacy (with phone number or street name): [place], [place]  Phone:  [phone] Fax:  [phone]     Agent: Please be advised that RX refills may take up to 3 business days. We ask that you follow-up with your pharmacy.

## 2020-06-08 NOTE — Telephone Encounter (Signed)
Requested medication (s) are due for refill today: no  Requested medication (s) are on the active medication list: yes  Last refill:  05/19/2020  Future visit scheduled: no  Notes to clinic:  review for continued use and refill   Requested Prescriptions  Pending Prescriptions Disp Refills   sildenafil (VIAGRA) 50 MG tablet 5 tablet 0    Sig: Take 1 tablet (50 mg total) by mouth daily as needed for erectile dysfunction.      Urology: Erectile Dysfunction Agents Failed - 06/08/2020 12:39 PM      Failed - Last BP in normal range    BP Readings from Last 1 Encounters:  05/18/20 (!) 140/98          Passed - Valid encounter within last 12 months    Recent Outpatient Visits           2 months ago Preoperative clearance   [place], Arrington T, NP   4 months ago Centrilobular emphysema ([place])   [name], [name] T, NP   5 months ago Centrilobular emphysema ([place])   [place], [name] T, NP   6 months ago Alcohol dependence with other alcohol-induced disorder ([place])   [place], [name] T, NP   7 months ago Uncontrolled hypertension   [place], Barbaraann Faster, NP       Future Appointments             In 2 months Agbor-Etang, Aaron Edelman, MD [facility], [facility]

## 2020-06-08 NOTE — Telephone Encounter (Signed)
Routing to provider  

## 2020-06-22 ENCOUNTER — Telehealth: Payer: Self-pay | Admitting: Nurse Practitioner

## 2020-06-22 NOTE — Telephone Encounter (Signed)
Pt request refill  sildenafil (VIAGRA) 50 MG tablet  5 pills  [place], [place] - [place] Phone:  [phone]  Fax:  [phone]

## 2020-06-23 ENCOUNTER — Other Ambulatory Visit: Payer: Self-pay | Admitting: Nurse Practitioner

## 2020-06-26 NOTE — Telephone Encounter (Signed)
done

## 2020-06-28 ENCOUNTER — Other Ambulatory Visit: Payer: Self-pay | Admitting: Nurse Practitioner

## 2020-07-03 ENCOUNTER — Other Ambulatory Visit: Payer: Self-pay

## 2020-07-03 ENCOUNTER — Telehealth: Payer: Self-pay | Admitting: Cardiology

## 2020-07-03 MED ORDER — CARVEDILOL 3.125 MG PO TABS: 3.1250 mg | ORAL_TABLET | Freq: Two times a day (BID) | ORAL | 3 refills | Status: DC

## 2020-07-03 MED ORDER — CLONIDINE HCL 0.3 MG PO TABS: 0.3000 mg | ORAL_TABLET | Freq: Two times a day (BID) | ORAL | 0 refills | Status: DC

## 2020-07-03 NOTE — Telephone Encounter (Signed)
Requested Prescriptions   Signed Prescriptions Disp Refills  . carvedilol (COREG) 3.125 MG tablet 180 tablet 3    Sig: Take 1 tablet (3.125 mg total) by mouth 2 (two) times daily.    Authorizing Provider: Kate Sable    Ordering User: Janan Ridge cloNIDine (CATAPRES) 0.3 MG tablet 180 tablet 0    Sig: Take 1 tablet (0.3 mg total) by mouth 2 (two) times daily.    Authorizing Provider: Kate Sable    Ordering User: Janan Ridge

## 2020-07-03 NOTE — Telephone Encounter (Signed)
Clon  Carvedilol    *STAT* If patient is at the pharmacy, call can be transferred to refill team.   1. Which medications need to be refilled? (please list name of each medication and dose if known)   Carvedilol 3.125 mg po BID  Clonidine 0.3 mg po BID   2. Which pharmacy/location (including street and city if local pharmacy) is medication to be sent to?  [place]   3. Do they need a 30 day or 90 day supply?  [place]

## 2020-07-03 NOTE — Telephone Encounter (Signed)
Requested Prescriptions   Signed Prescriptions Disp Refills   carvedilol (COREG) 3.125 MG tablet 180 tablet 3    Sig: Take 1 tablet (3.125 mg total) by mouth 2 (two) times daily.    Authorizing Provider: Kate Sable    Ordering User: Janan Ridge   cloNIDine (CATAPRES) 0.3 MG tablet 180 tablet 0    Sig: Take 1 tablet (0.3 mg total) by mouth 2 (two) times daily.    Authorizing Provider: Kate Sable    Ordering User: Janan Ridge

## 2020-07-20 ENCOUNTER — Other Ambulatory Visit: Payer: Self-pay | Admitting: Nurse Practitioner

## 2020-07-20 NOTE — Telephone Encounter (Signed)
Requested medication (s) are due for refill today:  Yes  Requested medication (s) are on the active medication list:  Yes  Future visit scheduled:  No  Last Refill: 06/28/20; #5/ no refills  Notes to clinic: pt. Failed protocol due to high BP from OV 05/18/20.  Please advise.  Requested Prescriptions  Pending Prescriptions Disp Refills   sildenafil (VIAGRA) 50 MG tablet 5 tablet 0    Sig: Take 1 tablet (50 mg total) by mouth daily as needed for erectile dysfunction.      Urology: Erectile Dysfunction Agents Failed - 07/20/2020 11:57 AM      Failed - Last BP in normal range    BP Readings from Last 1 Encounters:  05/18/20 (!) 140/98          Passed - Valid encounter within last 12 months    Recent Outpatient Visits           4 months ago Preoperative clearance   [facility] [place], [place] T, NP   5 months ago Centrilobular emphysema ([place])   [name], [name], NP   6 months ago Centrilobular emphysema ([place])   [place], [name] T, NP   7 months ago Alcohol dependence with other alcohol-induced disorder ([place])   [place], [name] T, NP   9 months ago Uncontrolled hypertension   [place], Barbaraann Faster, NP       Future Appointments             In 4 weeks Agbor-Etang, Aaron Edelman, MD [facility], [facility]

## 2020-07-20 NOTE — Telephone Encounter (Signed)
sildenafil (VIAGRA) 50 MG tablet Medication Date: 06/28/2020 Department: [facility] Ordering/Authorizing: [name], NP   Preferred Pharmacy   [place], [place] - [place]  [place] [address]  Phone: [phone] Fax: [phone]  Hours: Not open 24 hours

## 2020-07-21 MED ORDER — SILDENAFIL CITRATE 50 MG PO TABS: 50.0000 mg | ORAL_TABLET | Freq: Every day | ORAL | 0 refills | Status: DC | PRN

## 2020-08-04 ENCOUNTER — Other Ambulatory Visit: Payer: Self-pay | Admitting: Nurse Practitioner

## 2020-08-09 ENCOUNTER — Telehealth: Payer: Self-pay | Admitting: Cardiology

## 2020-08-09 NOTE — Telephone Encounter (Signed)
Pt c/o medication issue:  1. Name of Medication: clonidine .3mg  bid and carvedilol 3.125 bid  2. How are you currently taking this medication (dosage and times per day)? See above  3. Are you having a reaction (difficulty breathing--STAT)? no  4. What is your medication issue? Patient states he is very sick, throwing up, ear ringing, dry mouth, feels unsteady, SOB Patient really wants to dc medication if possible

## 2020-08-09 NOTE — Telephone Encounter (Signed)
Spoke to patient and he stated that he has been having ringing in his ears for 2 months and it is causing dizziness and severe nausea. Patient stated that he was given Clonidine while he was in prison in the past and he does not want to take it anymore. He believes his Carvedilol and Clonidine are both causing this and wants to know if he can stop them.  Will route to Dr. Garen Lah for review.

## 2020-08-10 ENCOUNTER — Other Ambulatory Visit: Payer: Self-pay | Admitting: Nurse Practitioner

## 2020-08-10 MED ORDER — DILTIAZEM HCL ER COATED BEADS 120 MG PO CP24: 120.0000 mg | ORAL_CAPSULE | Freq: Every day | ORAL | 5 refills | Status: DC

## 2020-08-10 NOTE — Telephone Encounter (Signed)
Requested medication (s) are due for refill today: Yes  Requested medication (s) are on the active medication list: Yes  Last refill:  05/31/19  Future visit scheduled: No  Notes to clinic:  Last filled 05/31/19 by Dr. Duffy Bruce.    Requested Prescriptions  Pending Prescriptions Disp Refills   albuterol (VENTOLIN HFA) 108 (90 Base) MCG/ACT inhaler [Pharmacy Med Name: ALBUTEROL HFA 90 MCG INHALER] 18 g 0    Sig: Inhale 2 puffs into the lungs every 6 (six) hours as needed for wheezing orshortness of breath.      Pulmonology:  Beta Agonists Failed - 08/10/2020  2:14 PM      Failed - One inhaler should last at least one month. If the patient is requesting refills earlier, contact the patient to check for uncontrolled symptoms.      Passed - Valid encounter within last 12 months    Recent Outpatient Visits           4 months ago Preoperative clearance   [place], [name], NP   6 months ago Centrilobular emphysema ([place])   [name], [name], NP   7 months ago Centrilobular emphysema ([place])   [place], [name] T, NP   8 months ago Alcohol dependence with other alcohol-induced disorder ([place])   [place], [name], NP   9 months ago Uncontrolled hypertension   [place], Barbaraann Faster, NP       Future Appointments             In 1 week Agbor-Etang, Aaron Edelman, MD [facility], [facility]

## 2020-08-10 NOTE — Telephone Encounter (Signed)
Spoke to patient and relayed Dr. Thereasa Solo recommendation as pasted below.  Patient will be stopping his Carvedilol and Clonidine and trying the Cardizem.  Patient can stop the medicine if it is causing intolerable side effects. However, patient should understand he has had 1 normal side effects on almost every blood pressure medication/class we have tried. As such, we are running out of options in terms of medications to control his blood pressure. If he decides on stopping any of his current medications, and is willing on starting a new medication, we can consider Cardizem CD 120 mg daily. Thank you

## 2020-08-10 NOTE — Addendum Note (Signed)
Addended by: Kavin Leech on: 08/10/2020 01:32 PM   Modules accepted: Orders

## 2020-08-18 ENCOUNTER — Ambulatory Visit: Payer: Medicaid Other | Admitting: Cardiology

## 2020-08-24 ENCOUNTER — Telehealth: Payer: Self-pay | Admitting: Cardiology

## 2020-08-24 ENCOUNTER — Ambulatory Visit (INDEPENDENT_AMBULATORY_CARE_PROVIDER_SITE_OTHER): Payer: Medicaid Other | Admitting: Cardiology

## 2020-08-24 ENCOUNTER — Other Ambulatory Visit: Payer: Self-pay

## 2020-08-24 ENCOUNTER — Encounter: Payer: Self-pay | Admitting: Cardiology

## 2020-08-24 VITALS — BP 142/70 | HR 88 | Ht 70.0 in | Wt 192.5 lb

## 2020-08-24 DIAGNOSIS — R079 Chest pain, unspecified: Secondary | ICD-10-CM | POA: Diagnosis not present

## 2020-08-24 DIAGNOSIS — F1010 Alcohol abuse, uncomplicated: Secondary | ICD-10-CM | POA: Diagnosis not present

## 2020-08-24 DIAGNOSIS — I10 Essential (primary) hypertension: Secondary | ICD-10-CM | POA: Diagnosis not present

## 2020-08-24 DIAGNOSIS — F17200 Nicotine dependence, unspecified, uncomplicated: Secondary | ICD-10-CM | POA: Diagnosis not present

## 2020-08-24 NOTE — Progress Notes (Signed)
Cardiology Office Note:    Date:  08/24/2020   ID:  Luke Pollen., DOB 24-Jan-1961, MRN [id number]  PCP:  [name], NP  Cardiologist:  Kate Sable, MD  Electrophysiologist:  None   Referring MD: [name], NP   Chief Complaint  Patient presents with  . Follow-up    3 month follow up. Meds reviewed by the pt. verbally. Pt. c/o when first entering the exam room felt some chest pressure on a scale from 1-10 being a 6 and has broke out into a sweat.  Pt. has ringing in ears, headache, dizzy, shortness of breath and chest pressure.    History of Present Illness:    Luke Denz. is a 59 y.o. 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  Patient previously seen for BP control.  She states having allergies to multiple medications.  Clonidine was started and titrated but patient states feeling ringing in his ears with clonidine and Coreg.  Both clonidine and Coreg was stopped, and patient started on diltiazem 120 mg daily.  He still smokes and drinks heavily.  Ringing in his ears has not improved.  States having occasional chest pressure which he rates as 5 out of 10 in severity, sometimes associated with exertion.  Previous echo on 10/2019 showed normal systolic and diastolic function.  Had another episode of chest pressure while in the room earlier today.  Currently feels okay.   Historical notes Patient has  poorly controlled blood pressures for years.  He has tried several medications but stopped due to adverse effects.  Lisinopril caused angioedema, carvedilol caused nausea/vomiting, amlodipine caused severe dizziness.  He has been drinking heavily for the past 18 months after losing his fiance.  He drinks 10-15 beers daily.  He states he has been trying to cut back.  He also is a current smoker for over 40 years.  Patient has also tried  chlorthalidone but was found to have hyponatremia and hypokalemia.  At the time, he was seen in emergency room for vomiting.  Blood pressure was noted to be elevated at 170/98.  He was started on hydralazine but later on stopped because hydralazine causing shortness of breath, pounding in his chest..      Past Medical History:  Diagnosis Date  . Angioedema 08/07/2019  . Arthritis    knees,shoulders, ankles, most joints  . Cervical disc disorder    difficuly moving neck left  . COPD (chronic obstructive pulmonary disease) (HCC)    chronic cough and wheezing  . Dyspnea    easily  . GERD (gastroesophageal reflux disease)   . Hemorrhoids   . History of hiatal hernia   . Hypertension    controlled on meds  . Pneumonia 04/2019  . Prostate disorder   . PTSD (post-traumatic stress disorder)   . Schizophrenia ([place])    Per patient's report.    Past Surgical History:  Procedure Laterality Date  . CATARACT EXTRACTION W/PHACO Left 03/28/2020   Procedure: CATARACT EXTRACTION PHACO AND INTRAOCULAR LENS PLACEMENT ([place]) LEFT MALYUGIN  MILOOP,   5.29  00:41.0;  Surgeon: Birder Robson, MD;  Location: [place];  Service: Ophthalmology;  Laterality: Left;  . EYE SURGERY Left    injury from a nail  . FOOT SURGERY Left     Current Medications: Current Meds  Medication Sig  . albuterol (VENTOLIN HFA) 108 (90 Base)  MCG/ACT inhaler Inhale 2 puffs into the lungs every 6 (six) hours as needed for wheezing orshortness of breath.  . diltiazem (CARDIZEM CD) 120 MG 24 hr capsule Take 1 capsule (120 mg total) by mouth daily.  [name] EPINEPHrine 0.3 mg/0.3 mL IJ SOAJ injection Inject 0.3 mLs (0.3 mg total) into the muscle as needed for anaphylaxis.  . fentaNYL (DURAGESIC) 25 MCG/HR Place 1 patch onto the skin every other day.   . finasteride (PROSCAR) 5 MG tablet Take 1 tablet (5 mg total) by mouth daily.  . hydrocortisone (ANUSOL-HC) 2.5 % rectal cream Place 1 application rectally 2 (two) times  daily. (Patient taking differently: Place 1 application rectally 2 (two) times daily as needed. )  . Ipratropium-Albuterol (COMBIVENT) 20-100 MCG/ACT AERS respimat Inhale 1 puff into the lungs every 6 (six) hours.  . Magnesium 500 MG TABS Take by mouth every other day.  . montelukast (SINGULAIR) 10 MG tablet Take 1 tablet (10 mg total) by mouth at bedtime.  [name] omeprazole (PRILOSEC) 40 MG capsule Take 1 capsule (40 mg total) by mouth daily.  . Potassium 99 MG TABS Take 1 tablet by mouth every other day.   . sildenafil (VIAGRA) 50 MG tablet Take 1 tablet (50 mg total) by mouth daily as needed for erectile dysfunction.  . tamsulosin (FLOMAX) 0.4 MG CAPS capsule Take 1 capsule (0.4 mg total) by mouth daily.  . Tiotropium Bromide-Olodaterol (STIOLTO RESPIMAT) 2.5-2.5 MCG/ACT AERS Inhale into the lungs every 6 (six) hours.  . triamcinolone cream (KENALOG) 0.1 % Apply 1 application topically 2 (two) times daily.     Allergies:   Lisinopril, Amlodipine, and Penicillins   Social History   Socioeconomic History  . Marital status: Significant Other    Spouse name: Not on file  . Number of children: Not on file  . Years of education: Not on file  . Highest education level: Not on file  Occupational History  . Not on file  Tobacco Use  . Smoking status: Current Every Day Smoker    Packs/day: 1.50    Years: 43.00    Pack years: 64.50    Types: Cigarettes  . Smokeless tobacco: Never Used  Vaping Use  . Vaping Use: Never used  Substance and Sexual Activity  . Alcohol use: Yes    Alcohol/week: 8.0 standard drinks    Types: 8 Cans of beer per week    Comment: beer  . Drug use: Never  . Sexual activity: Not on file  Other Topics Concern  . Not on file  Social History Narrative   Patient has new girlfriend they have been dating almost a year.    Social Determinants of Health   Financial Resource Strain:   . Difficulty of Paying Living Expenses: Not on file  Food Insecurity:   . Worried  About Charity fundraiser in the Last Year: Not on file  . Ran Out of Food in the Last Year: Not on file  Transportation Needs:   . Lack of Transportation (Medical): Not on file  . Lack of Transportation (Non-Medical): Not on file  Physical Activity:   . Days of Exercise per Week: Not on file  . Minutes of Exercise per Session: Not on file  Stress:   . Feeling of Stress : Not on file  Social Connections:   . Frequency of Communication with Friends and Family: Not on file  . Frequency of Social Gatherings with Friends and Family: Not on file  . Attends Religious  Services: Not on file  . Active Member of Clubs or Organizations: Not on file  . Attends Archivist Meetings: Not on file  . Marital Status: Not on file     Family History: The patient's family history includes Emphysema in his father and maternal grandmother; Heart disease in his father, maternal grandfather, mother, and sister; Osteoporosis in his mother and sister.  ROS:   Please see the history of present illness.     All other systems reviewed and are negative.  EKGs/Labs/Other Studies Reviewed:    The following studies were reviewed today:  TTE 11/18/2019 1. Left ventricular ejection fraction, by visual estimation, is 60 to 65%. The left ventricle has normal function. There is no left ventricular hypertrophy.  2. The left ventricle has no regional wall motion abnormalities.  3. Global right ventricle has normal systolic function.The right ventricular size is normal. No increase in right ventricular wall thickness.  4. Left atrial size was normal.  5. Right atrial size was normal.  6. The mitral valve is normal in structure. No evidence of mitral valve regurgitation. No evidence of mitral stenosis.  7. The tricuspid valve is normal in structure.  8. The aortic valve was not well visualized. Aortic valve regurgitation is not visualized. No evidence of aortic valve sclerosis or stenosis.  9. The pulmonic  valve was not well visualized. Pulmonic valve regurgitation is not visualized. 10. Normal pulmonary artery systolic pressure. 11. The inferior vena cava is normal in size with greater than 50% respiratory variability, suggesting right atrial pressure of 3 mmHg.  EKG:  EKG is  ordered today.  The ekg ordered today demonstrates normal sinus rhythm, normal ECG.  Recent Labs: 10/07/2019: Magnesium 1.8; TSH 0.750 03/22/2020: ALT 18 03/30/2020: BUN 14; Creatinine, Ser 0.82; Hemoglobin 15.8; Platelets 302; Potassium 4.0; Sodium 136  Recent Lipid Panel    Component Value Date/Time   CHOL 171 03/22/2020 1128   TRIG 173 (H) 03/22/2020 1128   HDL 56 05/05/2019 1424   VLDL 35 (H) 03/22/2020 1128   LDLCALC 86 05/05/2019 1424    Physical Exam:    VS:  BP (!) 142/70 (BP Location: Left Arm, Patient Position: Sitting, Cuff Size: Normal)   Pulse 88   Ht 5\' 10"  (1.778 m)   Wt 192 lb 8 oz (87.3 kg)   SpO2 98%   BMI 27.62 kg/m     Wt Readings from Last 3 Encounters:  08/24/20 192 lb 8 oz (87.3 kg)  05/18/20 185 lb 6 oz (84.1 kg)  04/11/20 188 lb (85.3 kg)     GEN:  Well nourished, well developed in no acute distress HEENT: Normal NECK: No JVD; No carotid bruits LYMPHATICS: No lymphadenopathy CARDIAC: RRR, no murmurs, rubs, gallops RESPIRATORY:  Clear to auscultation without rales, wheezing or rhonchi  ABDOMEN: Soft, non-tender, non-distended MUSCULOSKELETAL:  No edema; No deformity  SKIN: Warm and dry NEUROLOGIC:  Alert and oriented x 3 PSYCHIATRIC:  Normal affect   ASSESSMENT:    1. Chest pain of uncertain etiology   2. Hypertension, unspecified type   3. Smoking   4. ETOH abuse   5. Chest pain, unspecified type    PLAN:    In order of problems listed above:  1. Patient with chest pressure and diaphoresis.  Has risk factors of hypertension, current smoker.  Previous echocardiogram on 08/2019 showed normal systolic function EF 60 to 65%.  Get Lexiscan Myoview to evaluate any  significant ischemia. 2. Blood pressure is improved but  still not controlled.  Continue Cardizem CD 120 mg daily.  Smoking and alcohol intake contributing to blood pressures.  He was counseled regarding lifestyle modifications. 3. Current smoker for over 40 years.  Again, smoking cessation advised. 4. History of alcohol use, most likely contributing to elevated blood pressures.  Again EtOH cessation advised.   Follow-up after stress test.  This note was generated in part or whole with voice recognition software. Voice recognition is usually quite accurate but there are transcription errors that can and very often do occur. I apologize for any typographical errors that were not detected and corrected.  Medication Adjustments/Labs and Tests Ordered: Current medicines are reviewed at length with the patient today.  Concerns regarding medicines are outlined above.  Orders Placed This Encounter  Procedures  . NM Myocar Multi W/Spect W/Wall Motion / EF  . EKG 12-Lead   No orders of the defined types were placed in this encounter.   Patient Instructions  Medication Instructions:  Your physician recommends that you continue on your current medications as directed. Please refer to the Current Medication list given to you today.   *If you need a refill on your cardiac medications before your next appointment, please call your pharmacy*   Lab Work: None Ordered If you have labs (blood work) drawn today and your tests are completely normal, you will receive your results only by: [name] MyChart Message (if you have MyChart) OR . A paper copy in the mail If you have any lab test that is abnormal or we need to change your treatment, we will call you to review the results.   Testing/Procedures:   [place]   Your caregiver has ordered a Stress Test with nuclear imaging. The purpose of this test is to evaluate the blood supply to your heart muscle. This procedure is referred to as a  "Non-Invasive Stress Test." This is because other than having an IV started in your vein, nothing is inserted or "invades" your body. Cardiac stress tests are done to find areas of poor blood flow to the heart by determining the extent of coronary artery disease (CAD). Some patients exercise on a treadmill, which naturally increases the blood flow to your heart, while others who are  unable to walk on a treadmill due to physical limitations have a pharmacologic/chemical stress agent called Lexiscan . This medicine will mimic walking on a treadmill by temporarily increasing your coronary blood flow.      PLEASE REPORT TO [facility] MEDICAL MALL ENTRANCE   THE VOLUNTEERS AT THE FIRST DESK WILL DIRECT YOU WHERE TO GO     *Please note: these test may take anywhere between 2-4 hours to complete       Date of Procedure:_____________________________________   Arrival Time for Procedure:______________________________    PLEASE NOTIFY THE OFFICE AT LEAST 24 HOURS IN ADVANCE IF YOU ARE UNABLE TO KEEP YOUR APPOINTMENT.  [place] 24 HOURS IN ADVANCE IF YOU ARE UNABLE TO KEEP YOUR APPOINTMENT. [phone]       How to prepare for your Myoview test:        1. Do not eat or drink after midnight  2. No caffeine for 24 hours prior to test  3. No smoking 24 hours prior to test.  4. Unless instructed otherwise, Take your medication with a small sips of water.    5.         Ladies, please do not wear dresses. Skirts  or pants are appropriate. Please wear a short sleeve shirt.  6. No perfume, cologne or lotion.  7. Wear comfortable walking shoes. No heels!     Follow-Up: At [facility], you and your health needs are our priority.  As part of our continuing mission to provide you with exceptional heart care, we have created designated Provider Care Teams.  These Care Teams include your primary Cardiologist (physician) and Advanced Practice Providers  (APPs -  Physician Assistants and Nurse Practitioners) who all work together to provide you with the care you need, when you need it.  We recommend signing up for the patient portal called "MyChart".  Sign up information is provided on this After Visit Summary.  MyChart is used to connect with patients for Virtual Visits (Telemedicine).  Patients are able to view lab/test results, encounter notes, upcoming appointments, etc.  Non-urgent messages can be sent to your provider as well.   To learn more about what you can do with MyChart, go to [email or web address].    Your next appointment:   After Myoview   The format for your next appointment:   In Person  Provider:   Kate Sable, MD   Other Instructions      Signed, Kate Sable, MD  08/24/2020 12:30 PM    [place]

## 2020-08-24 NOTE — Patient Instructions (Signed)
Medication Instructions:  Your physician recommends that you continue on your current medications as directed. Please refer to the Current Medication list given to you today.   *If you need a refill on your cardiac medications before your next appointment, please call your pharmacy*   Lab Work: None Ordered If you have labs (blood work) drawn today and your tests are completely normal, you will receive your results only by: [name] MyChart Message (if you have MyChart) OR . A paper copy in the mail If you have any lab test that is abnormal or we need to change your treatment, we will call you to review the results.   Testing/Procedures:   [place]   Your caregiver has ordered a Stress Test with nuclear imaging. The purpose of this test is to evaluate the blood supply to your heart muscle. This procedure is referred to as a "Non-Invasive Stress Test." This is because other than having an IV started in your vein, nothing is inserted or "invades" your body. Cardiac stress tests are done to find areas of poor blood flow to the heart by determining the extent of coronary artery disease (CAD). Some patients exercise on a treadmill, which naturally increases the blood flow to your heart, while others who are  unable to walk on a treadmill due to physical limitations have a pharmacologic/chemical stress agent called Lexiscan . This medicine will mimic walking on a treadmill by temporarily increasing your coronary blood flow.      PLEASE REPORT TO [facility] MEDICAL MALL ENTRANCE   THE VOLUNTEERS AT THE FIRST DESK WILL DIRECT YOU WHERE TO GO     *Please note: these test may take anywhere between 2-4 hours to complete       Date of Procedure:_____________________________________   Arrival Time for Procedure:______________________________    PLEASE NOTIFY THE OFFICE AT LEAST 24 HOURS IN ADVANCE IF YOU ARE UNABLE TO KEEP YOUR APPOINTMENT.  [place] 24 HOURS IN ADVANCE IF YOU ARE UNABLE TO KEEP YOUR APPOINTMENT. [phone]       How to prepare for your Myoview test:        1. Do not eat or drink after midnight  2. No caffeine for 24 hours prior to test  3. No smoking 24 hours prior to test.  4. Unless instructed otherwise, Take your medication with a small sips of water.    5.         Ladies, please do not wear dresses. Skirts or pants are appropriate. Please wear a short sleeve shirt.  6. No perfume, cologne or lotion.  7. Wear comfortable walking shoes. No heels!     Follow-Up: At [facility], you and your health needs are our priority.  As part of our continuing mission to provide you with exceptional heart care, we have created designated Provider Care Teams.  These Care Teams include your primary Cardiologist (physician) and Advanced Practice Providers (APPs -  Physician Assistants and Nurse Practitioners) who all work together to provide you with the care you need, when you need it.  We recommend signing up for the patient portal called "MyChart".  Sign up information is provided on this After Visit Summary.  MyChart is used to connect with patients for Virtual Visits (Telemedicine).  Patients are able to view lab/test results, encounter notes, upcoming appointments, etc.  Non-urgent messages can be sent to your provider as well.   To learn more about what you can  do with MyChart, go to [email or web address].    Your next appointment:   After Myoview   The format for your next appointment:   In Person  Provider:   Kate Sable, MD   Other Instructions

## 2020-08-24 NOTE — Telephone Encounter (Signed)
Attempted to schedule lexi and fu per avs.  Patient states he is not doing the testing or follow up and declined to keep avs.  Patient requested this be shredded.
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
## 2020-08-28 ENCOUNTER — Encounter: Payer: Self-pay | Admitting: Nurse Practitioner

## 2020-08-28 ENCOUNTER — Ambulatory Visit (INDEPENDENT_AMBULATORY_CARE_PROVIDER_SITE_OTHER): Payer: Medicaid Other | Admitting: Nurse Practitioner

## 2020-08-28 ENCOUNTER — Other Ambulatory Visit: Payer: Self-pay

## 2020-08-28 VITALS — BP 168/83 | HR 75 | Temp 98.1°F | Wt 190.8 lb

## 2020-08-28 DIAGNOSIS — Z8739 Personal history of other diseases of the musculoskeletal system and connective tissue: Secondary | ICD-10-CM | POA: Diagnosis not present

## 2020-08-28 DIAGNOSIS — Z23 Encounter for immunization: Secondary | ICD-10-CM

## 2020-08-28 DIAGNOSIS — R42 Dizziness and giddiness: Secondary | ICD-10-CM | POA: Insufficient documentation

## 2020-08-28 NOTE — Assessment & Plan Note (Addendum)
Acute, ongoing for months.  Unclear etiology although coupled with ringing in ears and nausea/vomiting, Meniere's seems likely.  Recently saw Cardiologist for routine check up.  Will obtain CBC, CMP, and TSH today and place referral for ENT.  Will also obtain x-ray imaging of neck with reported history of degenerative issues with spine.  Return to clinic if symptoms do not improve after consultation with ENT.  May consider Neurology workup in future if symptoms persist and not Meniere's.

## 2020-08-28 NOTE — Patient Instructions (Addendum)
Influenza (Flu) Vaccine (Inactivated or Recombinant): What You Need to Know 1. Why get vaccinated? Influenza vaccine can prevent influenza (flu). Flu is a contagious disease that spreads around the [place] every year, usually between October and May. Anyone can get the flu, but it is more dangerous for some people. Infants and young children, people 57 years of age and older, pregnant women, and people with certain health conditions or a weakened immune system are at greatest risk of flu complications. Pneumonia, bronchitis, sinus infections and ear infections are examples of flu-related complications. If you have a medical condition, such as heart disease, cancer or diabetes, flu can make it worse. Flu can cause fever and chills, sore throat, muscle aches, fatigue, cough, headache, and runny or stuffy nose. Some people may have vomiting and diarrhea, though this is more common in children than adults. Each year thousands of people in the [place] die from flu, and many more are hospitalized. Flu vaccine prevents millions of illnesses and flu-related visits to the doctor each year. 2. Influenza vaccine CDC recommends everyone 57 months of age and older get vaccinated every flu season. and older get vaccinated every flu season. Children 6 months through 2 years of age may need 2 doses during a single flu season. Everyone else needs only 1 dose each flu season. It takes about 2 weeks for protection to develop after vaccination. There are many flu viruses, and they are always changing. Each year a new flu vaccine is made to protect against three or four viruses that are likely to cause disease in the upcoming flu season. Even when the vaccine doesn't exactly match these viruses, it may still provide some protection. Influenza vaccine does not cause flu. Influenza vaccine may be given at the same time as other vaccines. 3. Talk with your health care provider Tell your vaccine provider if the person getting the vaccine:  Has had an  allergic reaction after a previous dose of influenza vaccine, or has any severe, life-threatening allergies.  Has ever had Guillain-Barr Syndrome (also called GBS). In some cases, your health care provider may decide to postpone influenza vaccination to a future visit. People with minor illnesses, such as a cold, may be vaccinated. People who are moderately or severely ill should usually wait until they recover before getting influenza vaccine. Your health care provider can give you more information. 4. Risks of a vaccine reaction  Soreness, redness, and swelling where shot is given, fever, muscle aches, and headache can happen after influenza vaccine.  There may be a very small increased risk of Guillain-Barr Syndrome (GBS) after inactivated influenza vaccine (the flu shot). Young children who get the flu shot along with pneumococcal vaccine (PCV13), and/or DTaP vaccine at the same time might be slightly more likely to have a seizure caused by fever. Tell your health care provider if a child who is getting flu vaccine has ever had a seizure. People sometimes faint after medical procedures, including vaccination. Tell your provider if you feel dizzy or have vision changes or ringing in the ears. As with any medicine, there is a very remote chance of a vaccine causing a severe allergic reaction, other serious injury, or death. 5. What if there is a serious problem? An allergic reaction could occur after the vaccinated person leaves the clinic. If you see signs of a severe allergic reaction (hives, swelling of the face and throat, difficulty breathing, a fast heartbeat, dizziness, or weakness), call 9-1-1 and get the person to the nearest hospital. For other signs that  concern you, call your health care provider. Adverse reactions should be reported to the Vaccine Adverse Event Reporting System (VAERS). Your health care provider will usually file this report, or you can do it yourself. Visit the  VAERS website at www.vaers.SamedayNews.es or call (518)597-8859.VAERS is only for reporting reactions, and VAERS staff do not give medical advice. 6. The National Vaccine Injury Compensation Program The [employer] Vaccine Injury Compensation Program (VICP) is a federal program that was created to compensate people who may have been injured by certain vaccines. Visit the VICP website at GoldCloset.com.ee or call 249-466-9121 to learn about the program and about filing a claim. There is a time limit to file a claim for compensation. 7. How can I learn more?  Ask your healthcare provider.  Call your local or state health department.  Contact the Centers for Disease Control and Prevention (CDC): ? Call 631-335-5379 (1-800-CDC-INFO) or ? Visit CDC's https://gibson.com/ Vaccine Information Statement (Interim) Inactivated Influenza Vaccine (07/02/2018) This information is not intended to replace advice given to you by your health care provider. Make sure you discuss any questions you have with your health care provider. Document Revised: 02/23/2019 Document Reviewed: 07/06/2018 Elsevier Patient Education  [place].     Meniere Disease  Meniere disease is an inner ear disorder. It causes attacks of a spinning sensation (vertigo), dizziness, and ringing in the ear (tinnitus). It also causes hearing loss and a feeling of fullness or pressure in the ear. This is a lifelong condition, and it may get worse over time. You may have drop attacks or severe dizziness that makes you fall. A drop attack is when you suddenly fall without losing consciousness and you quickly recover after a few seconds or minutes. What are the causes? This condition is caused by having too much of the fluid that is in your inner ear (endolymph). When fluid builds up in your inner ear, it affects the nerves that control balance and hearing. The reason for the fluid buildup is not known. Possible causes  include:  Allergies.  An abnormal reaction of the body's defense system (autoimmune disease).  Viral infection of the inner ear.  Head injury. What increases the risk? You are more likely to develop this condition if:  You are older than age 74.  You have a family history of Meniere disease.  You have a history of autoimmune disease.  You have a history of migraine headaches. What are the signs or symptoms? Symptoms of this condition can come and go and may last for up to 4 hours at a time. Symptoms usually start in one ear. They may become more frequent and eventually involve both ears. Symptoms can include:  Fullness and pressure in your ear.  Roaring or ringing in your ear.  Vertigo and loss of balance.  Dizziness.  Decreased hearing.  Nausea and vomiting. How is this diagnosed? This condition is diagnosed based on:  A physical exam.  Tests , such as: ? A hearing test (audiogram). ? An electronystagmogram. This tests your balance nerve (vestibular nerve). ? Imaging studies of your inner ear, such as CT scan or MRI. ? Other balance tests, such as rotational or balance platform tests. How is this treated? There is no cure for this condition, but treatment can help to manage your symptoms. Treatment may include:  A low-salt diet. Limiting salt may help to reduce fluid in the body and relieve symptoms.  Oral or injected medicines to reduce or control: ? Vertigo. ? Nausea. ?  Fluid retention. ? Dizziness.  Use of an air pressure pulse generator. This is a machine that sends small pressure pulses into your ear canal.  Hearing aids.  Inner ear surgery. This is rare. When you have symptoms, it can be helpful to lie down on a flat surface and focus your eyes on one object that does not move. Try to stay in that position until your symptoms go away. Follow these instructions at home: Eating and drinking  Eat the same amount of food at the same time every day,  including snacks.  Do not skip meals.  Avoid caffeine.  Drink enough fluids to keep your urine clear or pale yellow.  Limit alcoholic drinks to one drink a day for non-pregnant women and 2 drinks a day for men. One drink equals 12 oz of beer, 5 oz of wine, or 1 oz of hard liquor.  Limit the salt (sodium) in your diet as told by your health care provider. Check ingredients and nutrition facts on packaged foods and beverages.  Do not eat foods that contain monosodium glutamate (MSG). General instructions  Do not use any products that contain nicotine or tobacco, such as cigarettes and e-cigarettes. If you need help quitting, ask your health care provider.  Take over-the-counter and prescription medicines only as told by your health care provider.  Find ways to reduce or avoid stress. If you need help with this, ask your health care provider.  Do not drive if you have vertigo or dizziness. Contact a health care provider if:  You have symptoms that last longer than 4 hours.  You have new or worse symptoms. Get help right away if:  You have been vomiting for 24 hours.  You cannot keep fluids down.  You have chest pain or trouble breathing. Summary  Meniere disease is an inner ear disorder. It causes attacks of a spinning sensation (vertigo), dizziness, and ringing in the ear (tinnitus). It also causes hearing loss and a feeling of fullness or pressure in the ear.  Symptoms of this condition can come and go and may last for up to 4 hours at a time.  When you have symptoms, it can be helpful to lie down on a flat surface and focus your eyes on one object that does not move. Try to stay in that position until your symptoms go away. This information is not intended to replace advice given to you by your health care provider. Make sure you discuss any questions you have with your health care provider. Document Revised: 10/17/2017 Document Reviewed: 09/25/2016 Elsevier Patient  Education  2020 [employer].

## 2020-08-29 LAB — CBC WITH DIFFERENTIAL/PLATELET
Basophils Absolute: 0.1 10*3/uL (ref 0.0–0.2)
Basos: 1 %
EOS (ABSOLUTE): 0.1 10*3/uL (ref 0.0–0.4)
Eos: 1 %
Hematocrit: 46.3 % (ref 37.5–51.0)
Hemoglobin: 16.3 g/dL (ref 13.0–17.7)
Immature Grans (Abs): 0 10*3/uL (ref 0.0–0.1)
Immature Granulocytes: 0 %
Lymphocytes Absolute: 2.1 10*3/uL (ref 0.7–3.1)
Lymphs: 20 %
MCH: 33 pg (ref 26.6–33.0)
MCHC: 35.2 g/dL (ref 31.5–35.7)
MCV: 94 fL (ref 79–97)
Monocytes Absolute: 0.9 10*3/uL (ref 0.1–0.9)
Monocytes: 9 %
Neutrophils Absolute: 7.3 10*3/uL — ABNORMAL HIGH (ref 1.4–7.0)
Neutrophils: 69 %
Platelets: 287 10*3/uL (ref 150–450)
RBC: 4.94 x10E6/uL (ref 4.14–5.80)
RDW: 12.3 % (ref 11.6–15.4)
WBC: 10.6 10*3/uL (ref 3.4–10.8)

## 2020-08-29 LAB — COMPREHENSIVE METABOLIC PANEL
ALT: 27 IU/L (ref 0–44)
AST: 32 IU/L (ref 0–40)
Albumin/Globulin Ratio: 1.4 (ref 1.2–2.2)
Albumin: 4.2 g/dL (ref 3.8–4.9)
Alkaline Phosphatase: 92 IU/L (ref 44–121)
BUN/Creatinine Ratio: 10 (ref 9–20)
BUN: 10 mg/dL (ref 6–24)
Bilirubin Total: 0.5 mg/dL (ref 0.0–1.2)
CO2: 24 mmol/L (ref 20–29)
Calcium: 9.3 mg/dL (ref 8.7–10.2)
Chloride: 101 mmol/L (ref 96–106)
Creatinine, Ser: 0.99 mg/dL (ref 0.76–1.27)
GFR calc Af Amer: 97 mL/min/{1.73_m2} (ref >59)
GFR calc non Af Amer: 84 mL/min/{1.73_m2} (ref >59)
Globulin, Total: 3.1 g/dL (ref 1.5–4.5)
Glucose: 94 mg/dL (ref 65–99)
Potassium: 4.5 mmol/L (ref 3.5–5.2)
Sodium: 140 mmol/L (ref 134–144)
Total Protein: 7.3 g/dL (ref 6.0–8.5)

## 2020-08-29 LAB — TSH: TSH: 1.61 u[IU]/mL (ref 0.450–4.500)

## 2020-09-15 ENCOUNTER — Other Ambulatory Visit: Payer: Self-pay | Admitting: Otolaryngology

## 2020-09-15 ENCOUNTER — Other Ambulatory Visit (HOSPITAL_COMMUNITY): Payer: Self-pay | Admitting: Otolaryngology

## 2020-09-15 DIAGNOSIS — R42 Dizziness and giddiness: Secondary | ICD-10-CM

## 2020-09-21 ENCOUNTER — Ambulatory Visit
Admission: RE | Admit: 2020-09-21 | Discharge: 2020-09-21 | Disposition: A | Discharge status: Home / Self Care | Payer: Medicaid Other | Source: Ambulatory Visit | Attending: Otolaryngology | Admitting: Otolaryngology

## 2020-09-21 ENCOUNTER — Other Ambulatory Visit: Payer: Self-pay

## 2020-09-21 DIAGNOSIS — R42 Dizziness and giddiness: Secondary | ICD-10-CM

## 2020-09-21 IMAGING — US US CAROTID DUPLEX BILAT
1 series · 13 of 24 positions shown · non-contrast
Comparison: None.

CLINICAL DATA: 59-year-old male with a history dizziness

EXAM:
BILATERAL CAROTID DUPLEX ULTRASOUND
TECHNIQUE: Gray scale imaging, color Doppler and duplex ultrasound were
performed of bilateral carotid and vertebral arteries in the neck.

[Series 1: us carotid bilateral · 13 of 64 slices shown]
[im 1/64]
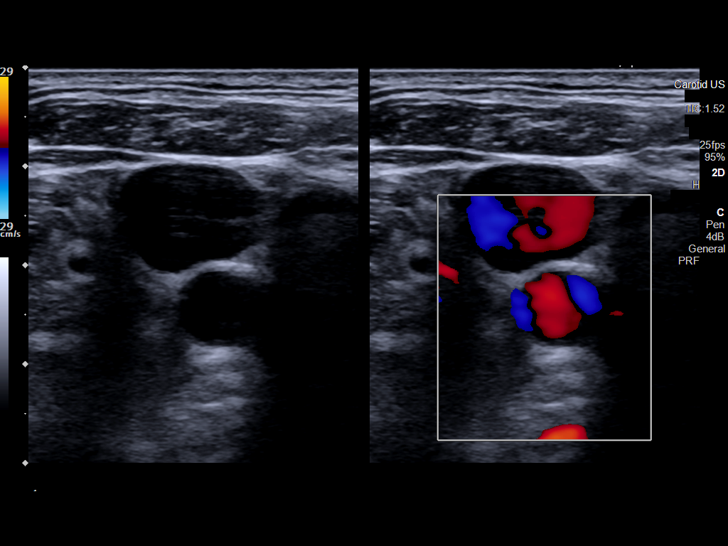
[im 6/64]
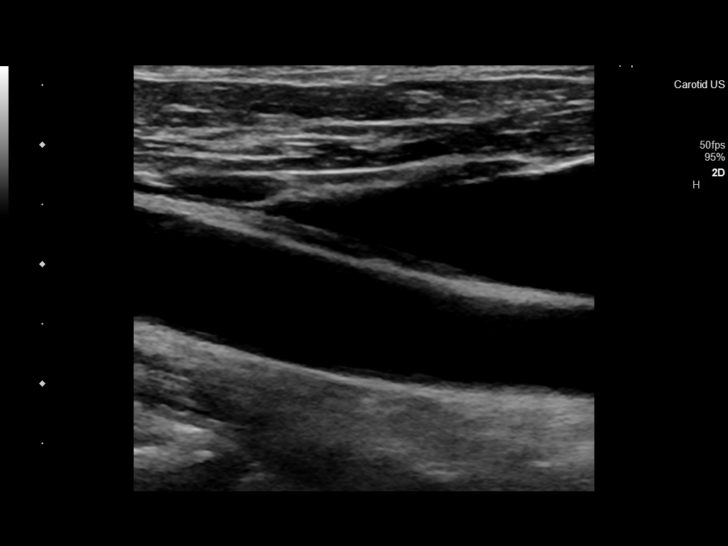
[im 11/64]
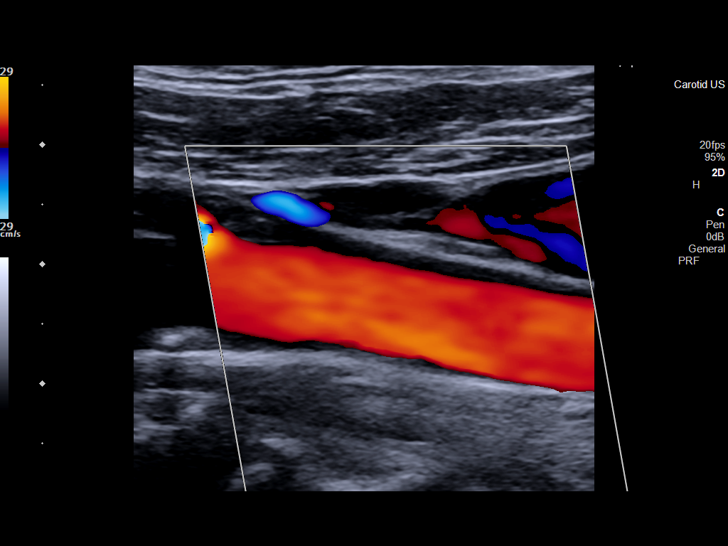
[im 17/64]
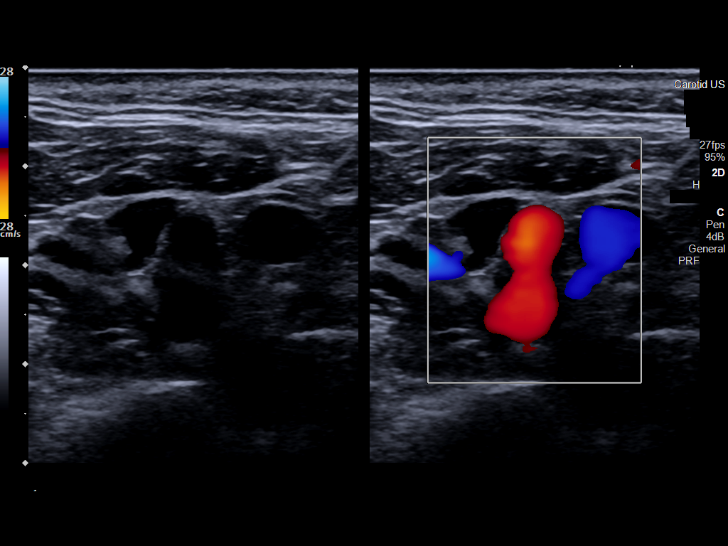
[im 22/64]
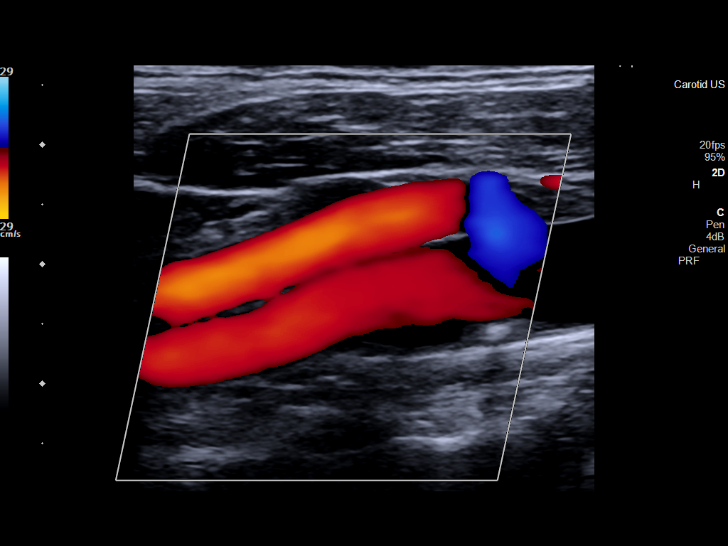
[im 28/64]
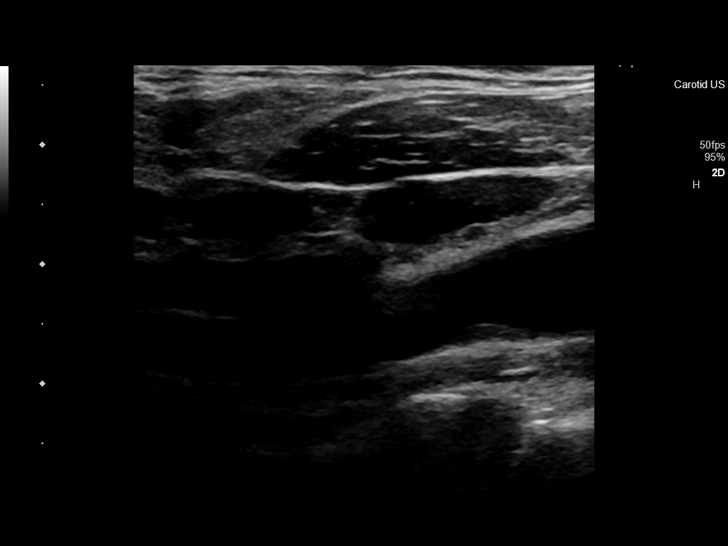
[im 33/64]
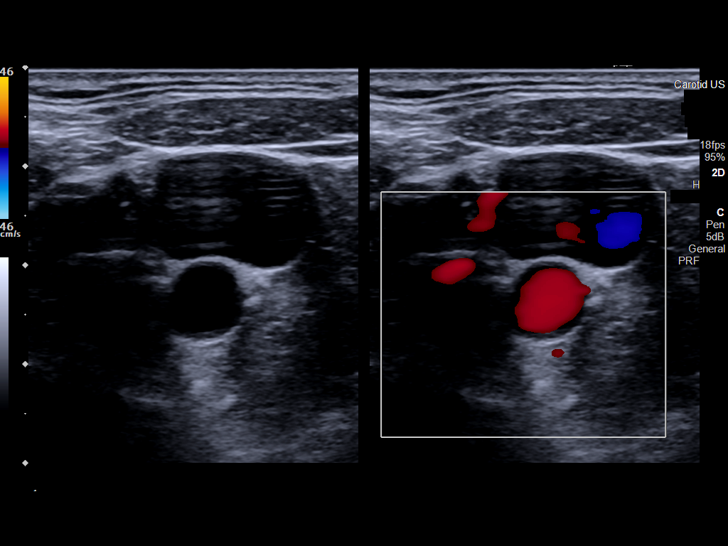
[im 36/64]
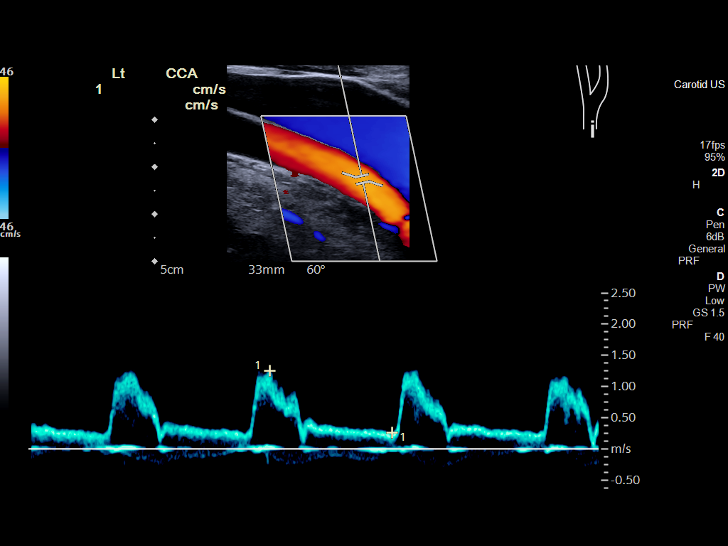
[im 42/64]
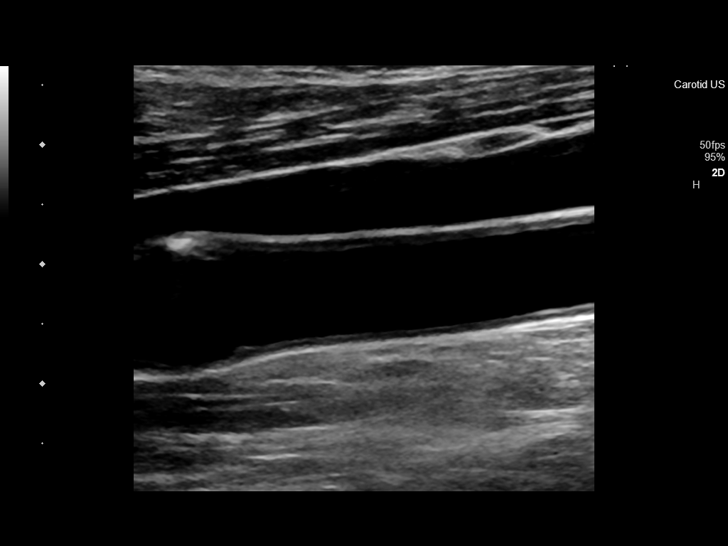
[im 47/64]
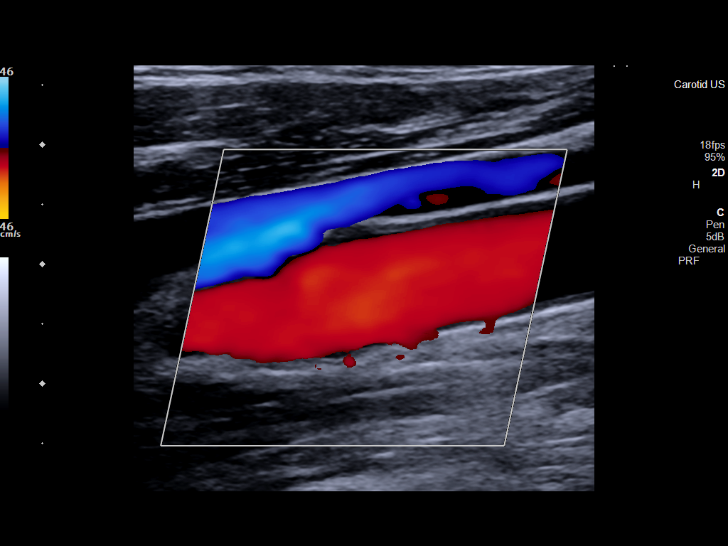
[im 53/64]
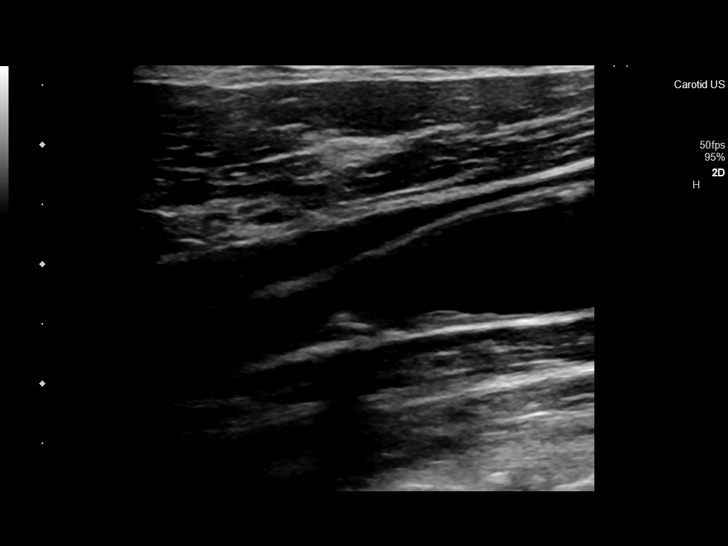
[im 58/64]
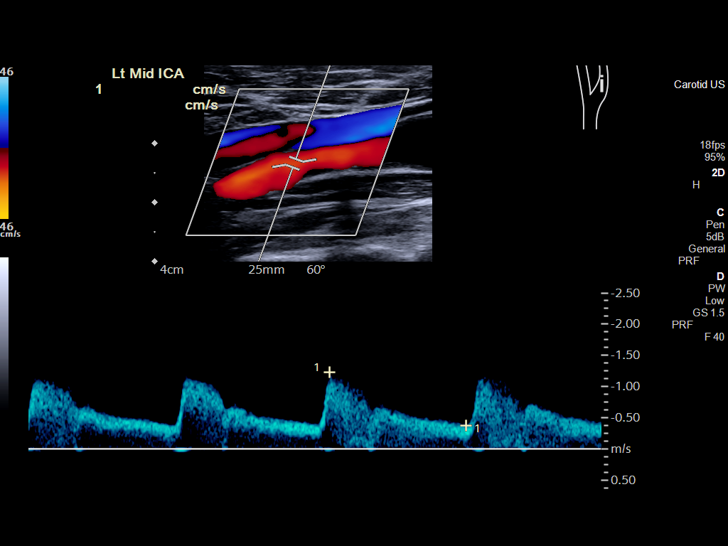
[im 64/64]
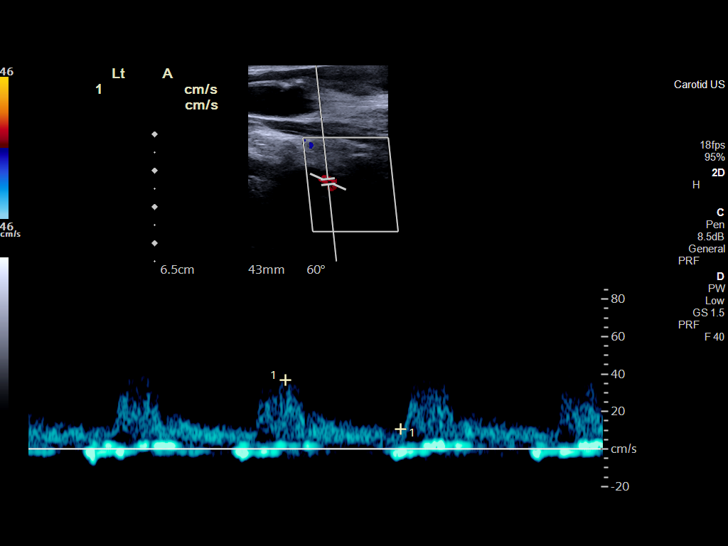

[13 of 24 positions shown; findings below may reference images not displayed]

FINDINGS: Criteria: Quantification of carotid stenosis is based on velocity
parameters that correlate the residual internal carotid diameter
with NASCET-based stenosis levels, using the diameter of the distal
internal carotid lumen as the denominator for stenosis measurement.

The following velocity measurements were obtained:

RIGHT

ICA:  Systolic 54 cm/sec, Diastolic 18 cm/sec

CCA:  68 cm/sec

SYSTOLIC ICA/CCA RATIO:

ECA:  99 cm/sec

LEFT

ICA:  Systolic 123 cm/sec, Diastolic 38 cm/sec

CCA:  69 cm/sec

SYSTOLIC ICA/CCA RATIO:

ECA:  98 cm/sec

Right Brachial SBP: Not acquired

Left Brachial SBP: Not acquired

RIGHT CAROTID ARTERY: No significant calcifications of the right
common carotid artery. Intermediate waveform maintained.
Heterogeneous and partially calcified plaque at the right carotid
bifurcation. No significant lumen shadowing. Low resistance waveform
of the right ICA. No significant tortuosity.

RIGHT VERTEBRAL ARTERY: Antegrade flow with low resistance waveform.

LEFT CAROTID ARTERY: No significant calcifications of the left
common carotid artery. Intermediate waveform maintained.
Heterogeneous and partially calcified plaque at the left carotid
bifurcation without significant lumen shadowing. Low resistance
waveform of the left ICA. No significant tortuosity.

LEFT VERTEBRAL ARTERY:  Antegrade flow with low resistance waveform.
IMPRESSION: Color duplex indicates minimal heterogeneous and calcified plaque,
with no hemodynamically significant stenosis by duplex criteria in
the extracranial cerebrovascular circulation.

## 2020-09-26 ENCOUNTER — Other Ambulatory Visit: Payer: Self-pay | Admitting: *Deleted

## 2020-09-26 DIAGNOSIS — Z122 Encounter for screening for malignant neoplasm of respiratory organs: Secondary | ICD-10-CM

## 2020-09-26 DIAGNOSIS — Z87891 Personal history of nicotine dependence: Secondary | ICD-10-CM

## 2020-09-26 NOTE — Progress Notes (Signed)
Contacted to schedule lung screening scan. Current smoker, 44.5 pack year

## 2020-09-28 ENCOUNTER — Other Ambulatory Visit: Payer: Self-pay

## 2020-09-28 ENCOUNTER — Other Ambulatory Visit: Payer: Self-pay | Admitting: Otolaryngology

## 2020-09-28 ENCOUNTER — Ambulatory Visit
Admission: RE | Admit: 2020-09-28 | Discharge: 2020-09-28 | Disposition: A | Discharge status: Home / Self Care | Payer: Medicaid Other | Source: Ambulatory Visit | Attending: Otolaryngology | Admitting: Otolaryngology

## 2020-09-28 DIAGNOSIS — R42 Dizziness and giddiness: Secondary | ICD-10-CM

## 2020-09-28 IMAGING — MR MR HEAD W/O CM
10 of 11 series · 41 of 48 positions shown · non-contrast
Comparison: Head CT [DATE].

CLINICAL DATA: Dizziness, ringing in ears and headache.

EXAM:
MRI HEAD WITHOUT CONTRAST
TECHNIQUE: Multiplanar, multiecho pulse sequences of the brain and surrounding
structures were obtained without intravenous contrast.

[Series 2: T1 · sagittal · 5.0mm · 0.45mm/px · 3 of 23 slices shown (1 of 3)]
[im 1/23]
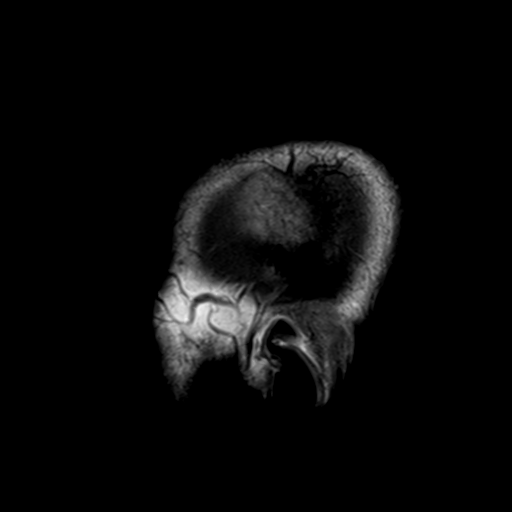
[im 12/23]
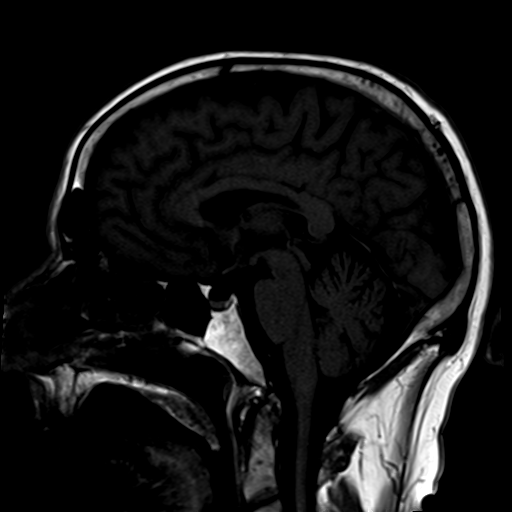
[im 23/23]
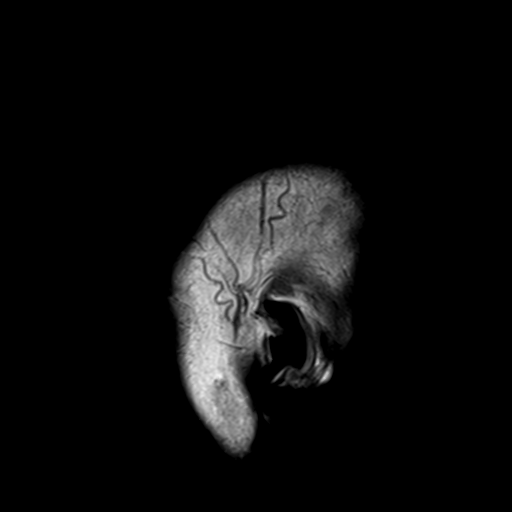

[Series 4: DWI · axial · 3.0mm · 1.20mm/px · z∈[-65,+96]mm · 7 of 55 slices shown (1 of 4)]
[im 1/55]
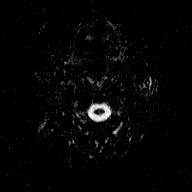
[im 10/55]
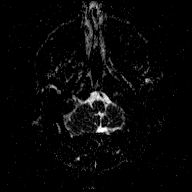
[im 19/55]
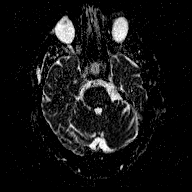
[im 28/55]
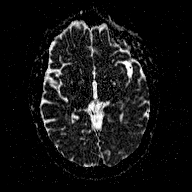
[im 37/55]
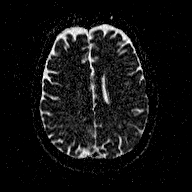
[im 46/55]
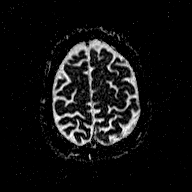
[im 55/55]
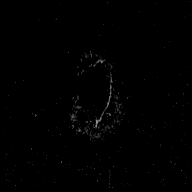

[Series 5: T2 · axial · 5.0mm · 0.72mm/px · z∈[-66,+96]mm · 3 of 26 slices shown (1 of 2)]
[im 1/26]
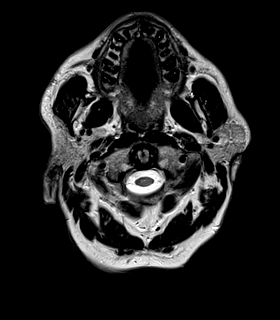
[im 13/26]
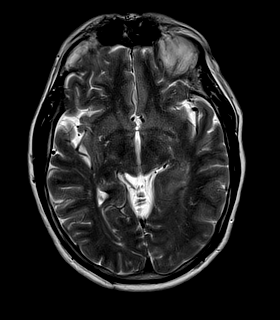
[im 26/26]
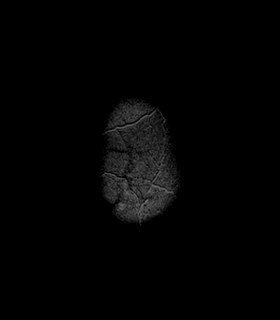

[Series 6: T2 · axial · 5.0mm · 0.72mm/px · z∈[-66,+96]mm · 3 of 26 slices shown (2 of 2)]
[im 1/26]
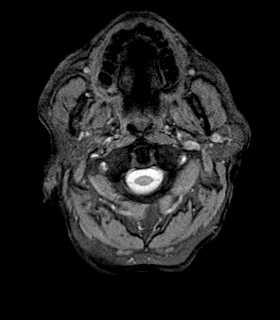
[im 13/26]
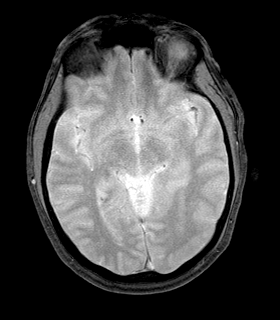
[im 26/26]
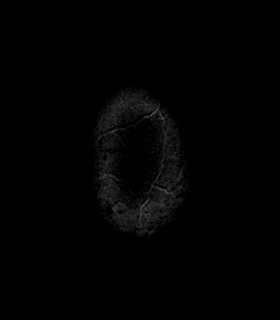

[Series 7: FLAIR · axial · 5.0mm · 0.45mm/px · z∈[-66,+96]mm · 3 of 26 slices shown]
[im 1/26]
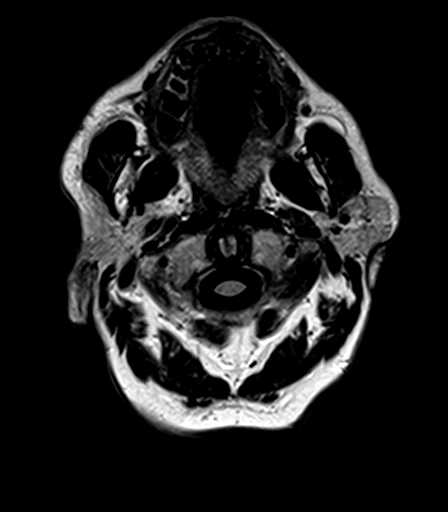
[im 13/26]
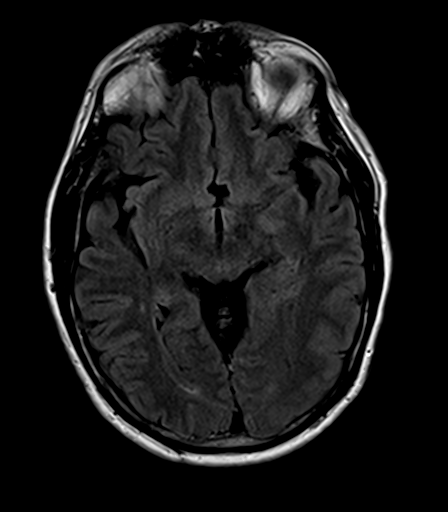
[im 26/26]
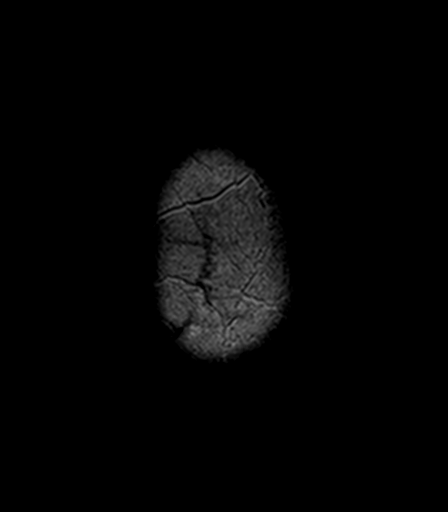

[Series 8: T1 · coronal · 3.0mm · 0.37mm/px · 2 of 13 slices shown (2 of 3)]
[im 1/13]
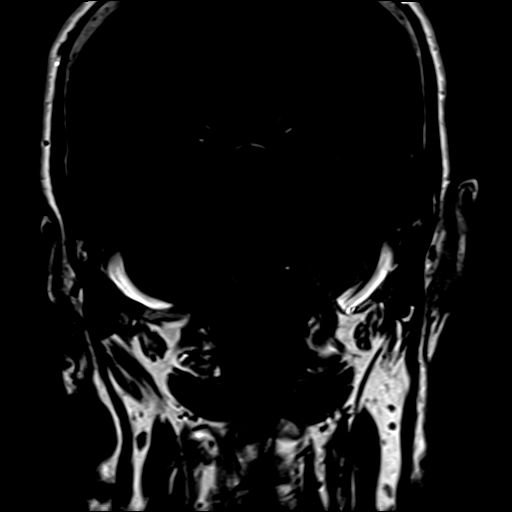
[im 13/13]
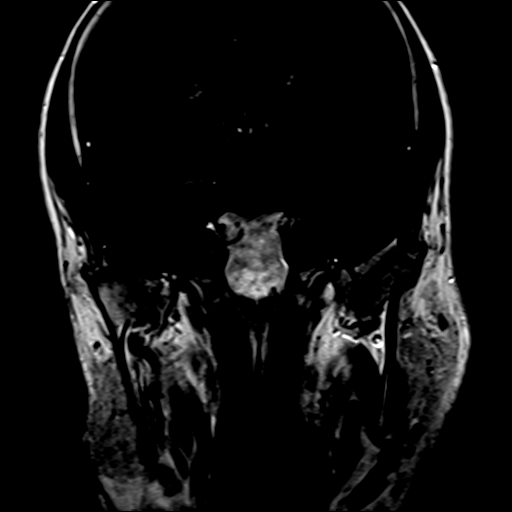

[Series 10: DWI · coronal · 3.0mm · 1.15mm/px · 6 of 47 slices shown (2 of 4)]
[im 1/47]
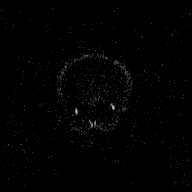
[im 10/47]
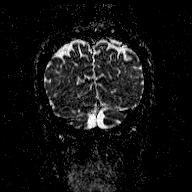
[im 19/47]
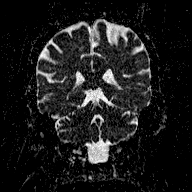
[im 28/47]
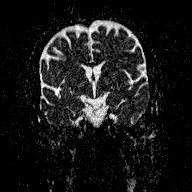
[im 37/47]
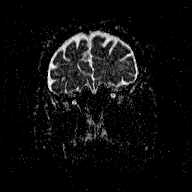
[im 47/47]
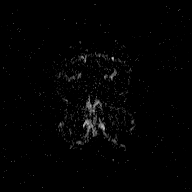

[Series 11: T1 · axial · 3.0mm · 0.37mm/px · 1 of 11 slices shown (3 of 3)]
[im 1/11]
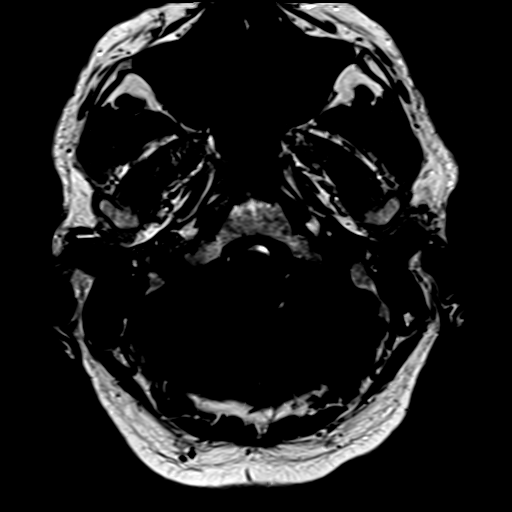

[Series 100: DWI · axial · 3.0mm · 1.20mm/px · z∈[-65,+96]mm · 7 of 55 slices shown (3 of 4)]
[im 1/55]
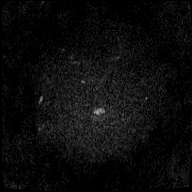
[im 10/55]
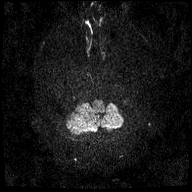
[im 19/55]
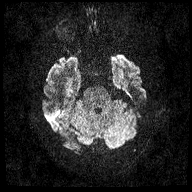
[im 28/55]
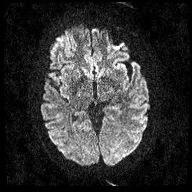
[im 37/55]
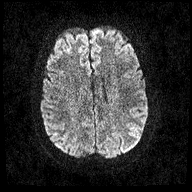
[im 46/55]
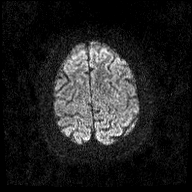
[im 55/55]
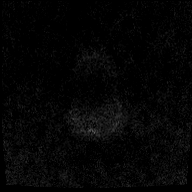

[Series 101: DWI · coronal · 3.0mm · 1.15mm/px · 6 of 48 slices shown (4 of 4)]
[im 1/48]
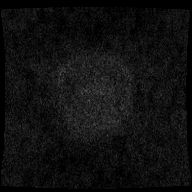
[im 10/48]
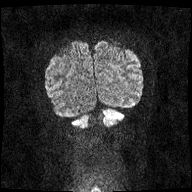
[im 19/48]
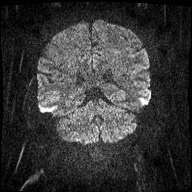
[im 29/48]
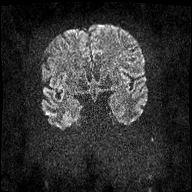
[im 38/48]
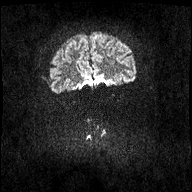
[im 48/48]
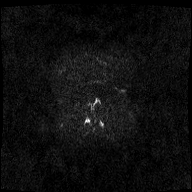

[41 of 48 positions shown; findings below may reference images not displayed]

FINDINGS: Brain: No acute infarction, hemorrhage, hydrocephalus, extra-axial
collection or mass lesion. Remote lacunar infarct in the body of
right caudate nucleus/periventricular white matter. Scattered small
foci of T2 hyperintensity are seen within the white matter of the
cerebral hemispheres, nonspecific. No cerebellopontine angle mass or
internal auditory canal lesion is demonstrated.Normal appearance of
the 7th and 8th cranial nerves bilaterally.

Vascular: Normal flow voids.

Skull and upper cervical spine: Normal marrow signal.

Sinuses/Orbits: Left lens surgery. Paranasal sinuses are essentially
clear.
IMPRESSION: 1. Remote lacunar infarct in the body of right caudate
nucleus/periventricular white matter.
2. Mild chronic white matter disease, nonspecific but may represent
early chronic microvascular ischemic changes.

## 2020-09-28 MED ORDER — GADOBUTROL 1 MMOL/ML IV SOLN: 9.0000 mL | Freq: Once | INTRAVENOUS | Status: DC | PRN

## 2020-09-29 ENCOUNTER — Telehealth: Payer: Self-pay

## 2020-09-29 DIAGNOSIS — Z8673 Personal history of transient ischemic attack (TIA), and cerebral infarction without residual deficits: Secondary | ICD-10-CM

## 2020-09-29 DIAGNOSIS — R9389 Abnormal findings on diagnostic imaging of other specified body structures: Secondary | ICD-10-CM

## 2020-09-29 NOTE — Telephone Encounter (Signed)
Copied from [place] [id number]. Topic: Referral - Request for Referral >> Sep 29, 2020  9:38 AM [place] A wrote: Patient was referred to ENT doctor and now ENT doctor is requesting [name] refer pateint to a neurologist in regard sto his neck. Please advise

## 2020-09-29 NOTE — Telephone Encounter (Signed)
Can we see if we can get ENT notes, have not seen patient in some time and would need to know reason for referral to neurology.  See to if ENT placed this referral.

## 2020-10-02 NOTE — Telephone Encounter (Signed)
[facility] and requested last OV note. Will await fax and give to provider to review.

## 2020-10-04 NOTE — Telephone Encounter (Signed)
Just looked again and have not seen note for ENT on him.

## 2020-10-04 NOTE — Telephone Encounter (Signed)
[name] did you ever receive the note from ENT?

## 2020-10-04 NOTE — Telephone Encounter (Signed)
Received OV note this afternoon. Placed in providers folder to review.

## 2020-10-05 NOTE — Telephone Encounter (Signed)
Patient notified of referral being placed.  

## 2020-10-09 ENCOUNTER — Telehealth: Payer: Self-pay | Admitting: Nurse Practitioner

## 2020-10-09 ENCOUNTER — Telehealth: Payer: Self-pay

## 2020-10-09 DIAGNOSIS — Z599 Problem related to housing and economic circumstances, unspecified: Secondary | ICD-10-CM

## 2020-10-09 NOTE — Telephone Encounter (Signed)
Referral sent to [facility] in [place].

## 2020-10-09 NOTE — Telephone Encounter (Signed)
Pt states last year he got help from [name] to fill his propane tank for heat. Pt hoping to get that help again, as he does not have the money to fill it.

## 2020-10-09 NOTE — Telephone Encounter (Signed)
Copied from [place] [id number]. Topic: Quick Communication - See Telephone Encounter >> Oct 09, 2020 10:33 AM [place] wrote: CRM for notification. See Telephone encounter for: 10/09/20.FU with pt  [phone] states that he was given a referral to ENT but they referred him to a neurologist. Pt called neurologist and they will see him with a referral from [facility], pt states needs as soon as possible, having issues

## 2020-10-09 NOTE — Telephone Encounter (Signed)
Yes, do not recall who assisted last year, but he did get assistance with this via CCM crew I believe.:)

## 2020-10-09 NOTE — Telephone Encounter (Signed)
Pt called to follow up on referral, as he had not heard. It looks like referral went to [facility])  Pt states he cannot get to [facility] for an appt. Pt needs appt in [place].

## 2020-10-09 NOTE — Telephone Encounter (Signed)
Routing to provider and CCM team.  

## 2020-10-09 NOTE — Telephone Encounter (Signed)
I referred him to ENT for treatment for likely Meniere's Disease which they do treat.  Neurology can be considered as a next step if he has already been to ENT and continues to have symptoms.

## 2020-10-10 ENCOUNTER — Ambulatory Visit
Admission: RE | Admit: 2020-10-10 | Discharge: 2020-10-10 | Disposition: A | Discharge status: Home / Self Care | Payer: Medicaid Other | Source: Ambulatory Visit | Attending: Nurse Practitioner | Admitting: Nurse Practitioner

## 2020-10-10 ENCOUNTER — Other Ambulatory Visit: Payer: Self-pay

## 2020-10-10 DIAGNOSIS — Z87891 Personal history of nicotine dependence: Secondary | ICD-10-CM | POA: Insufficient documentation

## 2020-10-10 DIAGNOSIS — Z122 Encounter for screening for malignant neoplasm of respiratory organs: Secondary | ICD-10-CM | POA: Diagnosis present

## 2020-10-10 IMAGING — CT CT CHEST LUNG CANCER SCREENING LOW DOSE W/O CM
2 of 5 series · 15 of 40 positions shown, 18 images · non-contrast
Comparison: Low-dose lung cancer screening CT chest dated
[DATE]

CLINICAL DATA: 59-year-old male current smoker, with 45 pack-year
history of smoking, for follow-up lung cancer screening

EXAM:
CT CHEST WITHOUT CONTRAST LOW-DOSE FOR LUNG CANCER SCREENING
TECHNIQUE: Multidetector CT imaging of the chest was performed following the
standard protocol without IV contrast.

[Series 3: lung 1.00 · axial · 0.68mm/px · z∈[-1274,-925]mm · 12 of 387 slices shown, 15 images]
[im 19/387  mediastinal]
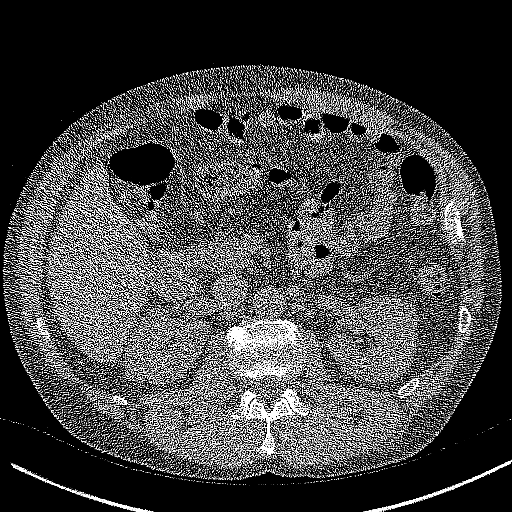
[im 19/387  lung]
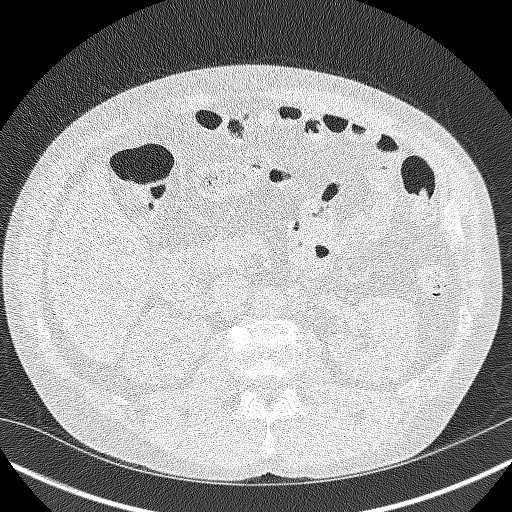
[im 56/387  lung]
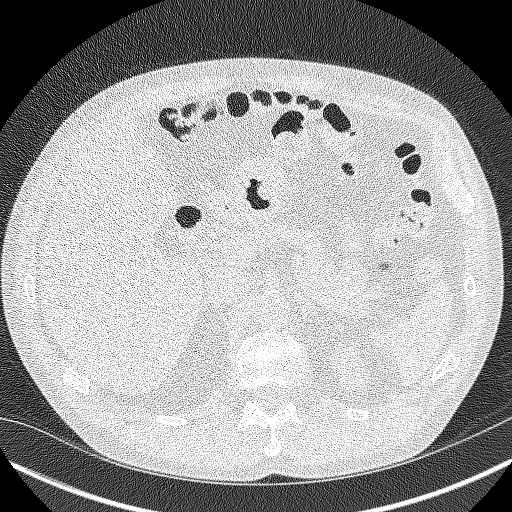
[im 92/387  lung]
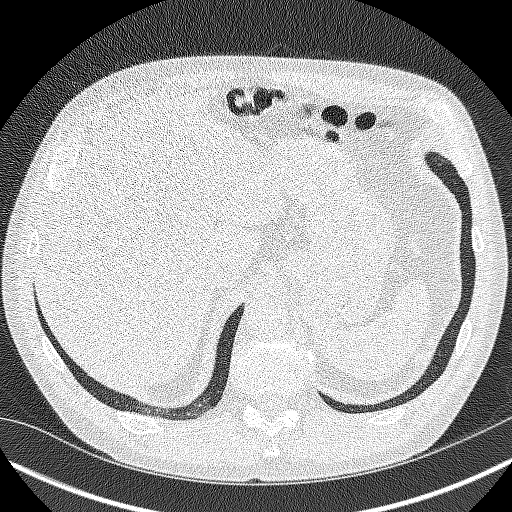
[im 111/387  lung]
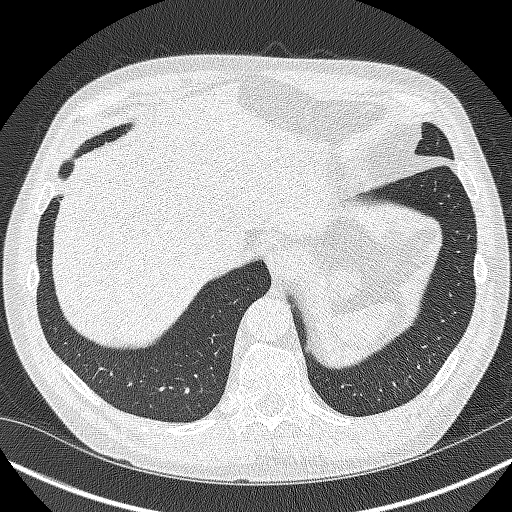
[im 148/387  mediastinal]
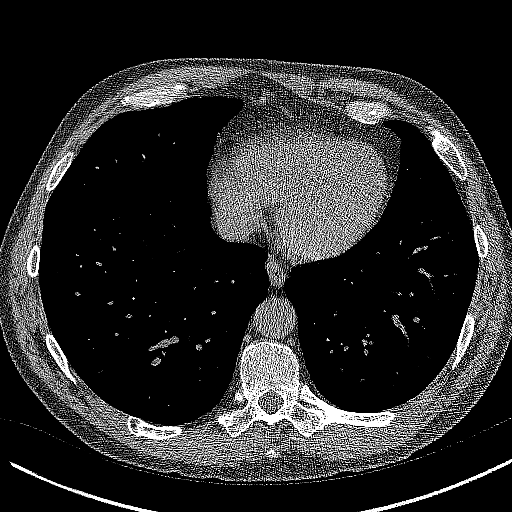
[im 148/387  lung]
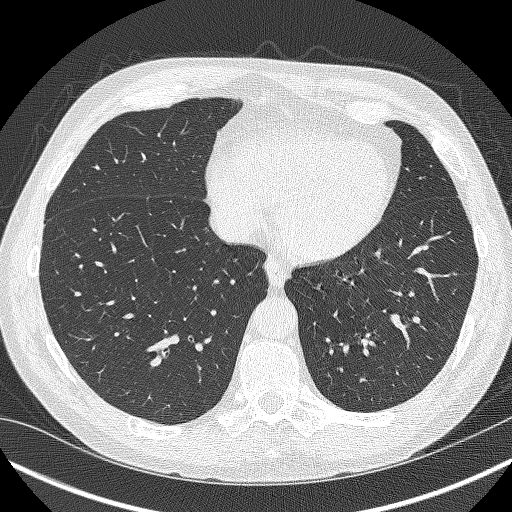
[im 184/387  lung]
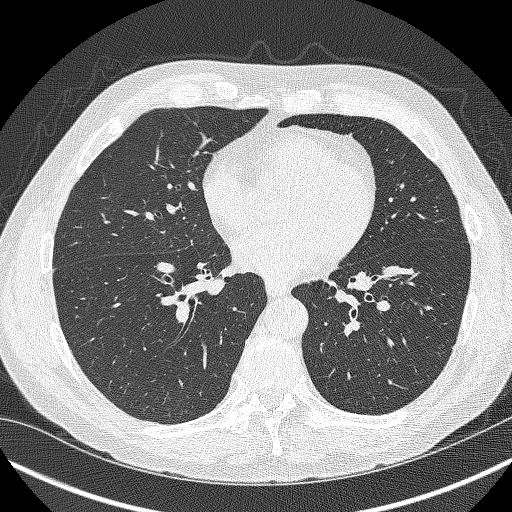
[im 203/387  lung]
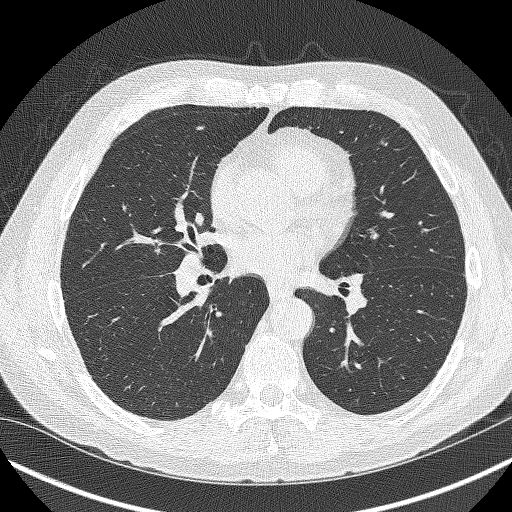
[im 239/387  lung]
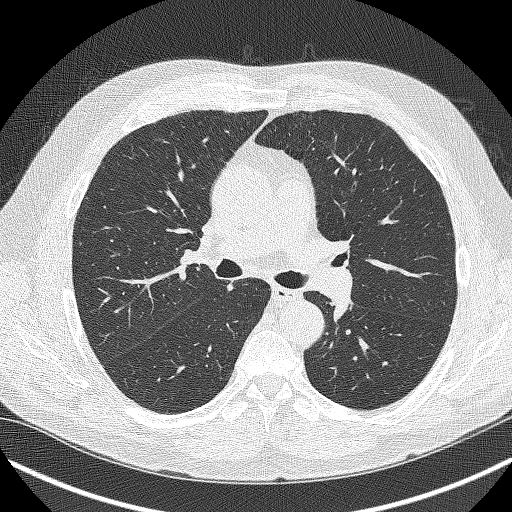
[im 276/387  mediastinal]
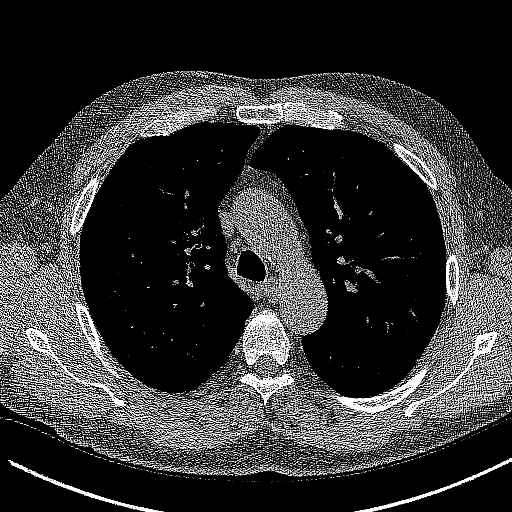
[im 276/387  lung]
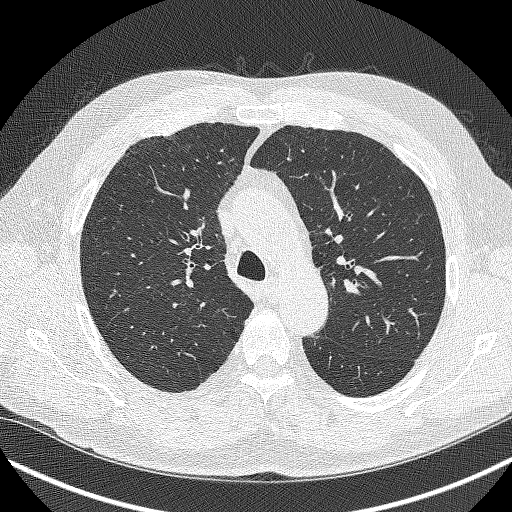
[im 295/387  lung]
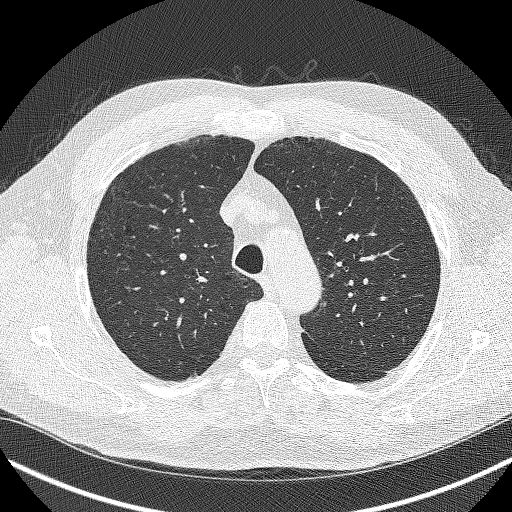
[im 331/387  lung]
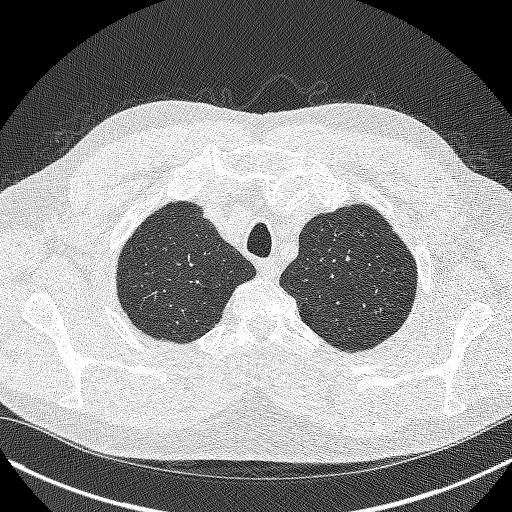
[im 368/387  lung]
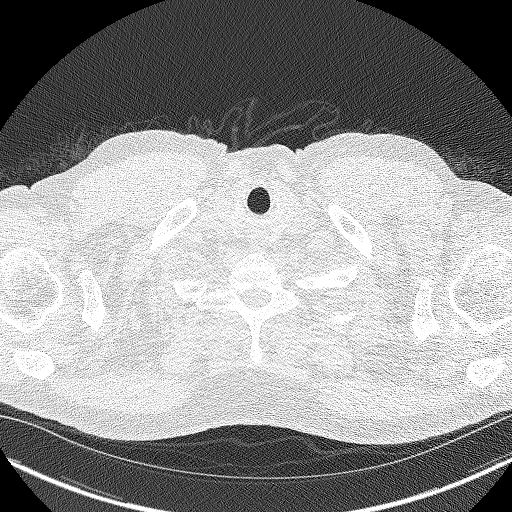

[Series 4: coronals lung 1.00 cor · coronal · 0.68mm/px · 3 of 292 slices shown]
[im 59/292  lung]
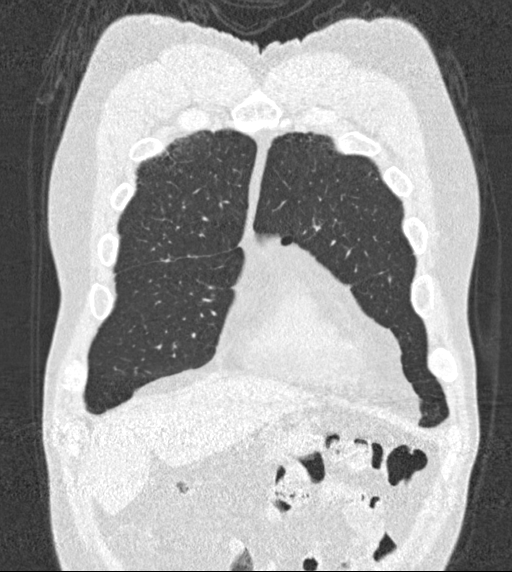
[im 117/292  lung]
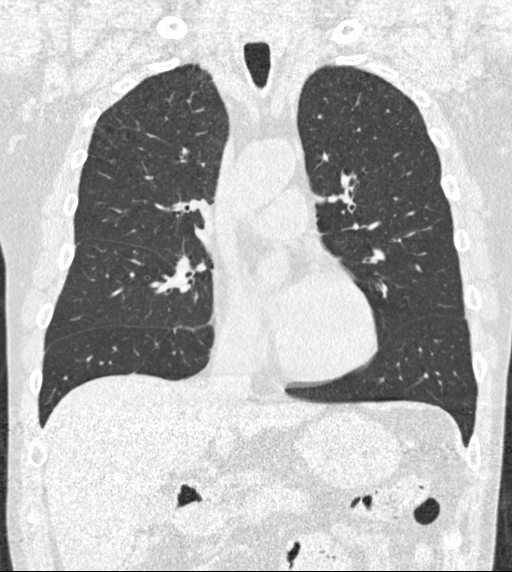
[im 175/292  lung]
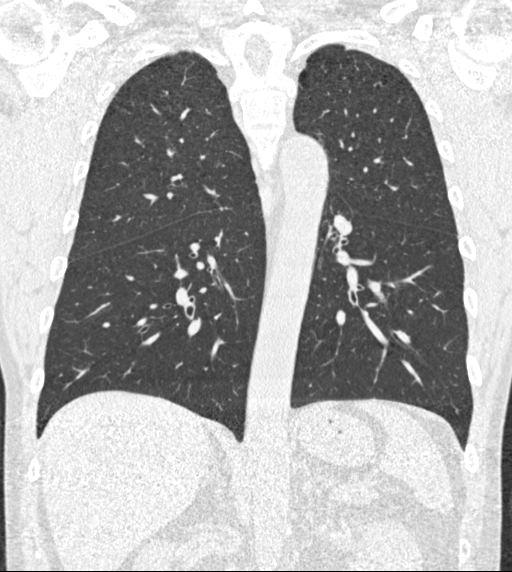

[15 of 40 positions shown; findings below may reference images not displayed]

FINDINGS: Cardiovascular: The heart is normal in size. No pericardial
effusion.

No evidence of thoracic aortic aneurysm. Mild atherosclerotic
calcifications of the aortic arch.

Mediastinum/Nodes: No suspicious mediastinal lymphadenopathy.

Small left axillary nodes, likely reactive.

Visualized thyroid is unremarkable.

Lungs/Pleura: Mild biapical pleural-parenchymal scarring.

Mild centrilobular and paraseptal emphysematous changes, upper lung
predominant.

No focal consolidation.

Perifissural nodules along the right minor fissure measuring up to
6.5 mm. Additional scattered bilateral pulmonary nodules measuring
up to 5.6 mm, unchanged.

No pleural effusion or pneumothorax.

Upper Abdomen: Visualized upper abdomen is grossly unremarkable.

Musculoskeletal: Visualized osseous structures are within normal
limits.
IMPRESSION: Lung-RADS 2, benign appearance or behavior.

Aortic Atherosclerosis ([DT]-[DT]) and Emphysema ([DT]-[DT]).

## 2020-10-10 NOTE — Telephone Encounter (Signed)
Referral to Neuro was entered by [facility] and sent to [facility] yesterday per referral notes.   Called and notified patient of referral being sent over.

## 2020-10-10 NOTE — Telephone Encounter (Signed)
[name], can you enter a new referral for this?

## 2020-10-10 NOTE — Telephone Encounter (Signed)
Referral placed.  Thanks for all you do Brooke!

## 2020-10-10 NOTE — Telephone Encounter (Signed)
Thank you guys <3 You all are the best

## 2020-10-11 ENCOUNTER — Telehealth: Payer: Self-pay | Admitting: *Deleted

## 2020-10-11 NOTE — Telephone Encounter (Signed)
Notified patient of LDCT lung cancer screening program results with recommendation for 12 month follow up imaging. Also notified of incidental findings noted below and is encouraged to discuss further with PCP who will receive a copy of this note and/or the CT report. Patient verbalizes understanding.   IMPRESSION: Lung-RADS 2, benign appearance or behavior.  Aortic Atherosclerosis (ICD10-I70.0) and Emphysema (ICD10-J43.9).

## 2020-10-13 ENCOUNTER — Other Ambulatory Visit: Payer: Self-pay | Admitting: Nurse Practitioner

## 2020-10-17 ENCOUNTER — Ambulatory Visit: Payer: Self-pay

## 2020-10-17 NOTE — Telephone Encounter (Signed)
Patient called with multiple symptoms-dizziness for several months, vomiting for several months/years off and on. He says for the past week he's been coughing more than normal, hot/cold with chills, body aches, vomited once today with blood. He says he feels like he has bronchitis and needs an antibiotic. He says his SOB is a little worse than normal SOB with emphysema. He says he's wheezing, has a runny nose and chest pain. Virtual appointment scheduled for tomorrow at 1300 with Marnee Guarneri, NP, care advice given, patient verbalized understanding.  Reason for Disposition  Fever present > 3 days (72 hours)  Answer Assessment - Initial Assessment Questions 1. ONSET: "When did the cough begin?"      1 week ago 2. SEVERITY: "How bad is the cough today?"       Same as all the time, but different 3. SPUTUM: "Describe the color of your sputum" (none, dry cough; clear, white, yellow, green)      Brown when it comes up in the morning 4. HEMOPTYSIS: "Are you coughing up any blood?" If so ask: "How much?" (flecks, streaks, tablespoons, etc.)     No 5. DIFFICULTY BREATHING: "Are you having difficulty breathing?" If Yes, ask: "How bad is it?" (e.g., mild, moderate, severe)    - MILD: No SOB at rest, mild SOB with walking, speaks normally in sentences, can lay down, no retractions, pulse < 100.    - MODERATE: SOB at rest, SOB with minimal exertion and prefers to sit, cannot lie down flat, speaks in phrases, mild retractions, audible wheezing, pulse 100-120.    - SEVERE: Very SOB at rest, speaks in single words, struggling to breathe, sitting hunched forward, retractions, pulse > 120      Moderate 6. FEVER: "Do you have a fever?" If Yes, ask: "What is your temperature, how was it measured, and when did it start?"     No thermometer, but hot/cold chills, body aches 7. CARDIAC HISTORY: "Do you have any history of heart disease?" (e.g., heart attack, congestive heart failure)      No 8. LUNG HISTORY: "Do  you have any history of lung disease?"  (e.g., pulmonary embolus, asthma, emphysema)     Empysema 9. PE RISK FACTORS: "Do you have a history of blood clots?" (or: recent major surgery, recent prolonged travel, bedridden)     No 10. OTHER SYMPTOMS: "Do you have any other symptoms?" (e.g., runny nose, wheezing, chest pain)      Yes-runny nose, wheezing, chest pain 11. PREGNANCY: "Is there any chance you are pregnant?" "When was your last menstrual period?"       N/A 12. TRAVEL: "Have you traveled out of the country in the last month?" (e.g., travel history, exposures)       No  Protocols used: COUGH - ACUTE NON-PRODUCTIVE-A-AH

## 2020-10-18 NOTE — Telephone Encounter (Signed)
Patient called and advised his appointment was scheduled in error on 11/07/20 instead of the documented 10/18/20 at 1300 with [name]. I placed patient on hold and called the office, spoke with Collene Mares, [facility] about the scheduling error and asked if she could schedule tomorrow with another provider. The patient hung up before the call could be connected, Collene Mares will call the patient.

## 2020-10-19 ENCOUNTER — Other Ambulatory Visit: Payer: Self-pay | Admitting: Nurse Practitioner

## 2020-10-19 ENCOUNTER — Telehealth (INDEPENDENT_AMBULATORY_CARE_PROVIDER_SITE_OTHER): Payer: Medicaid Other | Admitting: Family Medicine

## 2020-10-19 ENCOUNTER — Other Ambulatory Visit: Payer: Self-pay

## 2020-10-19 ENCOUNTER — Encounter: Payer: Self-pay | Admitting: Family Medicine

## 2020-10-19 DIAGNOSIS — J209 Acute bronchitis, unspecified: Secondary | ICD-10-CM

## 2020-10-19 DIAGNOSIS — J440 Chronic obstructive pulmonary disease with acute lower respiratory infection: Secondary | ICD-10-CM | POA: Diagnosis not present

## 2020-10-19 DIAGNOSIS — J432 Centrilobular emphysema: Secondary | ICD-10-CM

## 2020-10-19 MED ORDER — SILDENAFIL CITRATE 50 MG PO TABS: 50.0000 mg | ORAL_TABLET | Freq: Every day | ORAL | 2 refills | Status: DC | PRN

## 2020-10-19 MED ORDER — DOXYCYCLINE HYCLATE 100 MG PO TABS: 100.0000 mg | ORAL_TABLET | Freq: Two times a day (BID) | ORAL | 0 refills | Status: AC

## 2020-10-19 MED ORDER — MONTELUKAST SODIUM 10 MG PO TABS
10.0000 mg | ORAL_TABLET | Freq: Every day | ORAL | 3 refills | Status: DC
Start: 2020-10-19 — End: 2021-03-27

## 2020-10-19 MED ORDER — PREDNISONE 20 MG PO TABS: 40.0000 mg | ORAL_TABLET | Freq: Every day | ORAL | 0 refills | Status: AC

## 2020-10-19 NOTE — Patient Instructions (Addendum)
It was great to see you!  Our plans for today:  - Take the antibiotic and steroid as prescribed. Take albuterol and stiolto every 4-6 hours to help with your breathing. If you are still having difficulties breathing and this isn't helping, you should present to the Emergency Department. - I placed a referral for you to get your screening colonoscopy.  - We will work to get you rescheduled for your neck imaging. Let us know if you don't hear about an appointment in the next week or so.  - We are testing you for COVID and flu. While you are waiting for your results, you should self-quarantine. Self-quarantine means that you do not leave your house. You do not go to the store or drive around. You try to avoid the other people who live with you (stay in a different room and wear a mask when you have to be in contact with them.) Your family members and house-mates need to also be aware that you are being tested in case they develop any symptoms. If you become significantly worse and cannot breathe while waiting for results, call 911. If you have any questions or are concerned while you're waiting for your results - please call our office or send a mychart message.   Take care and seek immediate care sooner if you develop any concerns.   Dr. Ky Barban

## 2020-10-19 NOTE — Progress Notes (Signed)
Virtual Visit via Video Note  I connected with Luke Briggs. on 10/19/20 at  8:20 AM EST by a video enabled telemedicine application and verified that I am speaking with the correct person using two identifiers.  Location: Patient: home Provider: [facility]   I discussed the limitations of evaluation and management by telemedicine and the availability of in person appointments. The patient expressed understanding and agreed to proceed.  History of Present Illness:  COUGH  Duration: 1 week Circumstances of initial development of cough: unknown Cough severity: severe Cough description: productive, brown-greenish Aggravating factors:  nothing Alleviating factors: nothing Status:  worse  Treatments attempted: cold/sinus and albuterol Wheezing: yes Shortness of breath: yes Chest pain: yes Chest tightness:yes Nasal congestion: yes Runny nose: yes Frequent throat clearing or swallowing: no Hemoptysis: yes Fevers: subjective Night sweats: no Weight loss: no  Vomiting: yes Chills: yes Recent foreign travel: no Tuberculosis contacts: no  COVID contacts: no Smoking: yes Taking stiolto BID, albuterol TID.  Recently completed low dose CT chest for lung cancer screening on 11/23 - emphysema, unchanged b/l lung nodules  COVID vaccinated. Scheduled to get booster today.    Observations/Objective:  Tia visit conducted over the phone.  Speaks in full sentences though with frequent hacking coughing.  Assessment and Plan:  COPD with acute exacerbation With acute exacerbation.  Will provide doxycycline and prednisone burst.  Instructed on frequent albuterol and Stiolto use as well as emergency precautions.  We will also test for flu and Covid, instructions given on self quarantine until results return.  Advised to wait on booster until through acute illness.    I discussed the assessment and treatment plan with the patient. The patient was provided an opportunity to ask questions  and all were answered. The patient agreed with the plan and demonstrated an understanding of the instructions.   The patient was advised to call back or seek an in-person evaluation if the symptoms worsen or if the condition fails to improve as anticipated.  I provided 16 minutes of non-face-to-face time during this encounter.   Myles Gip, DO

## 2020-10-19 NOTE — Assessment & Plan Note (Signed)
With acute exacerbation.  Will provide doxycycline and prednisone burst.  Instructed on frequent albuterol and Stiolto use as well as emergency precautions.  We will also test for flu and Covid, instructions given on self quarantine until results return.  Advised to wait on booster until through acute illness.

## 2020-10-19 NOTE — Telephone Encounter (Signed)
Medication Refill - Medication: montelukast (SINGULAIR) 10 MG tablet  sildenafil (VIAGRA) 50 MG tablet  Has the patient contacted their pharmacy? No. (Agent: If no, request that the patient contact the pharmacy for the refill.) (Agent: If yes, when and what did the pharmacy advise?)  Preferred Pharmacy (with phone number or street name): [facility]   Agent: Please be advised that RX refills may take up to 3 business days. We ask that you follow-up with your pharmacy.

## 2020-10-21 LAB — NOVEL CORONAVIRUS, NAA: SARS-CoV-2, NAA: NOT DETECTED

## 2020-10-21 LAB — SARS-COV-2, NAA 2 DAY TAT

## 2020-10-24 ENCOUNTER — Telehealth: Payer: Self-pay

## 2020-10-24 NOTE — Telephone Encounter (Signed)
Pt here with bill  from [facility] stated he was told to bring in the office his bill for asstance in paying it.

## 2020-10-24 NOTE — Telephone Encounter (Signed)
Thanks for letting me know. Patient is receiving assistance from [facility]. I am in contact with [facility] office to see if the invoice can be sent to them via inter- office mail.

## 2020-10-26 ENCOUNTER — Telehealth: Payer: Self-pay | Admitting: Nurse Practitioner

## 2020-10-26 NOTE — Telephone Encounter (Signed)
   [facility] 10/26/2020   Name: Luke Briggs.   MRN: [id number]   DOB: December 05, 1960   AGE: 59 y.o.   GENDER: male   PCP [name], NP.    Assisted patient with receiving gas from for his gas tank to heat his home. Worked with [employer] ([place]  and [employer] to get patient's gas tank refilled. Invoice from [employer] sent to [facility] for payment.  No additional needs at this time.   Closing referral pending any other needs of patient.     [place], Care Management Phone: [phone] Email: [email or web address]

## 2020-10-30 ENCOUNTER — Other Ambulatory Visit: Payer: Self-pay

## 2020-10-30 ENCOUNTER — Emergency Department: Payer: Medicaid Other

## 2020-10-30 ENCOUNTER — Emergency Department
Admission: EM | Admit: 2020-10-30 | Discharge: 2020-10-30 | Disposition: A | Discharge status: Home / Self Care | Payer: Medicaid Other | Attending: Student in an Organized Health Care Education/Training Program | Admitting: Student in an Organized Health Care Education/Training Program

## 2020-10-30 DIAGNOSIS — Z7951 Long term (current) use of inhaled steroids: Secondary | ICD-10-CM | POA: Diagnosis not present

## 2020-10-30 DIAGNOSIS — R079 Chest pain, unspecified: Secondary | ICD-10-CM | POA: Insufficient documentation

## 2020-10-30 DIAGNOSIS — Z79899 Other long term (current) drug therapy: Secondary | ICD-10-CM | POA: Diagnosis not present

## 2020-10-30 DIAGNOSIS — Z20822 Contact with and (suspected) exposure to covid-19: Secondary | ICD-10-CM | POA: Insufficient documentation

## 2020-10-30 DIAGNOSIS — R12 Heartburn: Secondary | ICD-10-CM | POA: Insufficient documentation

## 2020-10-30 DIAGNOSIS — I10 Essential (primary) hypertension: Secondary | ICD-10-CM | POA: Diagnosis not present

## 2020-10-30 DIAGNOSIS — R112 Nausea with vomiting, unspecified: Secondary | ICD-10-CM

## 2020-10-30 DIAGNOSIS — J449 Chronic obstructive pulmonary disease, unspecified: Secondary | ICD-10-CM | POA: Diagnosis not present

## 2020-10-30 DIAGNOSIS — R519 Headache, unspecified: Secondary | ICD-10-CM | POA: Diagnosis not present

## 2020-10-30 DIAGNOSIS — F17210 Nicotine dependence, cigarettes, uncomplicated: Secondary | ICD-10-CM | POA: Diagnosis not present

## 2020-10-30 LAB — LIPASE, BLOOD: Lipase: 24 U/L (ref 11–51)

## 2020-10-30 LAB — COMPREHENSIVE METABOLIC PANEL
ALT: 29 U/L (ref 0–44)
AST: 33 U/L (ref 15–41)
Albumin: 4.4 g/dL (ref 3.5–5.0)
Alkaline Phosphatase: 92 U/L (ref 38–126)
Anion gap: 14 (ref 5–15)
BUN: 13 mg/dL (ref 6–20)
CO2: 26 mmol/L (ref 22–32)
Calcium: 9.6 mg/dL (ref 8.9–10.3)
Chloride: 100 mmol/L (ref 98–111)
Creatinine, Ser: 1.26 mg/dL — ABNORMAL HIGH (ref 0.61–1.24)
GFR, Estimated: >60 mL/min (ref >60)
Glucose, Bld: 114 mg/dL — ABNORMAL HIGH (ref 70–99)
Potassium: 4.1 mmol/L (ref 3.5–5.1)
Sodium: 140 mmol/L (ref 135–145)
Total Bilirubin: 1.1 mg/dL (ref 0.3–1.2)
Total Protein: 8.8 g/dL — ABNORMAL HIGH (ref 6.5–8.1)

## 2020-10-30 LAB — CBC WITH DIFFERENTIAL/PLATELET
Abs Immature Granulocytes: 0.08 10*3/uL — ABNORMAL HIGH (ref 0.00–0.07)
Basophils Absolute: 0.1 10*3/uL (ref 0.0–0.1)
Basophils Relative: 1 %
Eosinophils Absolute: 0.1 10*3/uL (ref 0.0–0.5)
Eosinophils Relative: 1 %
HCT: 53.5 % — ABNORMAL HIGH (ref 39.0–52.0)
Hemoglobin: 18 g/dL — ABNORMAL HIGH (ref 13.0–17.0)
Immature Granulocytes: 1 %
Lymphocytes Relative: 21 %
Lymphs Abs: 2.7 10*3/uL (ref 0.7–4.0)
MCH: 31.4 pg (ref 26.0–34.0)
MCHC: 33.6 g/dL (ref 30.0–36.0)
MCV: 93.4 fL (ref 80.0–100.0)
Monocytes Absolute: 0.4 10*3/uL (ref 0.1–1.0)
Monocytes Relative: 3 %
Neutro Abs: 9.3 10*3/uL — ABNORMAL HIGH (ref 1.7–7.7)
Neutrophils Relative %: 73 %
Platelets: 343 10*3/uL (ref 150–400)
RBC: 5.73 MIL/uL (ref 4.22–5.81)
RDW: 12.9 % (ref 11.5–15.5)
WBC: 12.6 10*3/uL — ABNORMAL HIGH (ref 4.0–10.5)
nRBC: 0 % (ref 0.0–0.2)

## 2020-10-30 LAB — RESP PANEL BY RT-PCR (FLU A&B, COVID) ARPGX2
Influenza A by PCR: NEGATIVE
Influenza B by PCR: NEGATIVE
SARS Coronavirus 2 by RT PCR: NEGATIVE

## 2020-10-30 LAB — TROPONIN I (HIGH SENSITIVITY)
Troponin I (High Sensitivity): 11 ng/L (ref <18)
Troponin I (High Sensitivity): 11 ng/L (ref <18)

## 2020-10-30 IMAGING — DX DG CHEST 1V PORT
2 series · 2 of 2 positions shown · non-contrast
Comparison: Chest radiograph dated [DATE] and CT dated
[DATE].

CLINICAL DATA: 59-year-old male with shortness of breath.

EXAM:
PORTABLE CHEST 1 VIEW

[chest ap (1 of 2)]
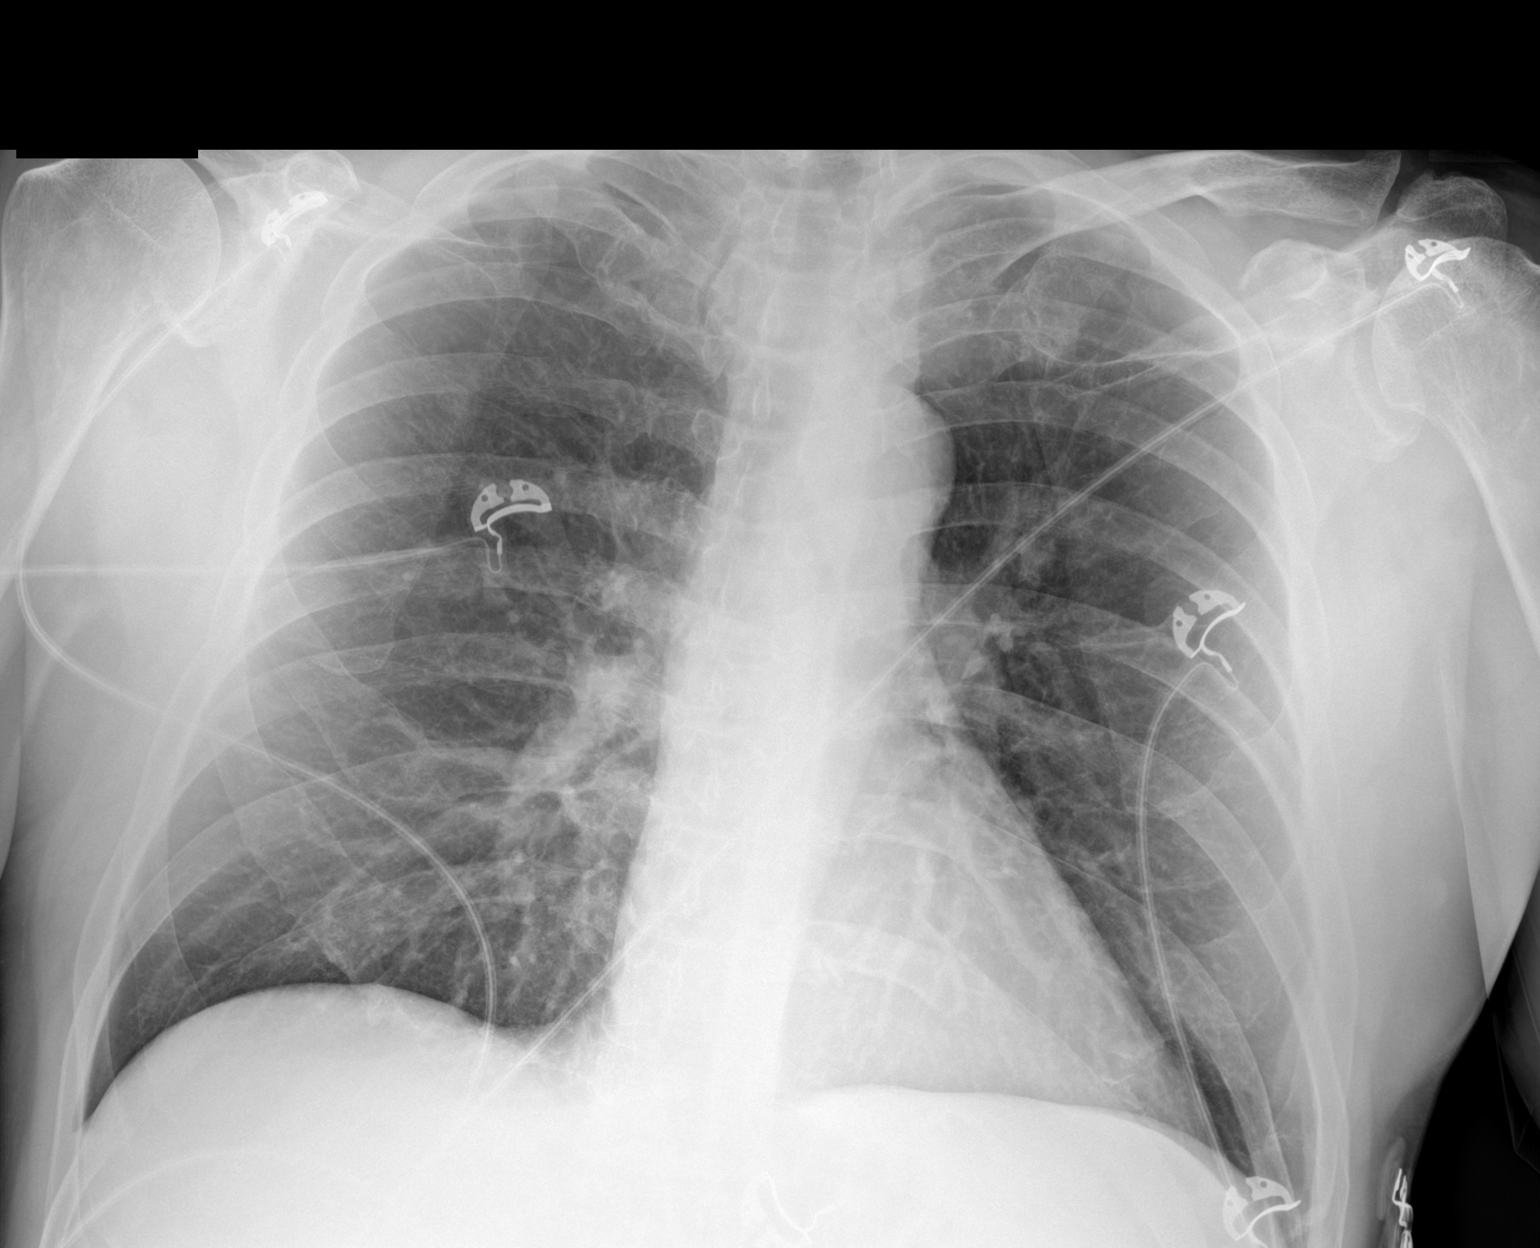

[chest ap (2 of 2)]
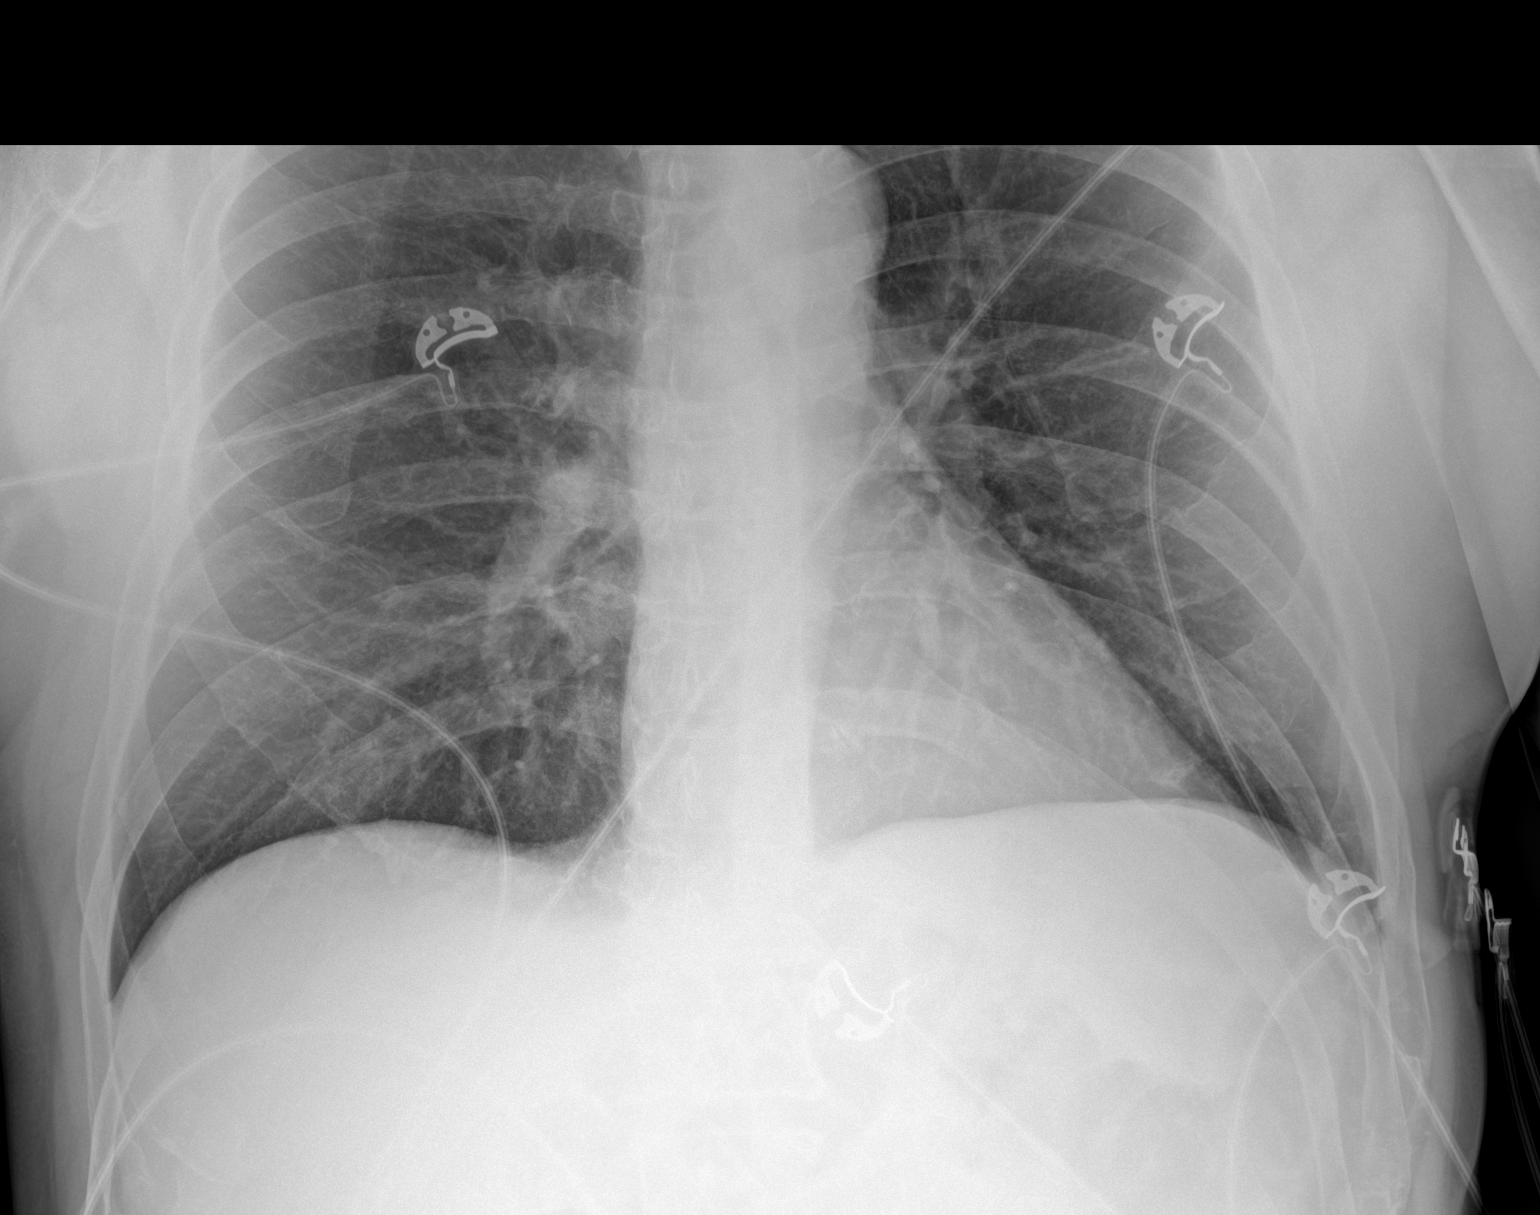

[2 of 2 positions shown; findings below may reference images not displayed]

FINDINGS: No focal consolidation, pleural effusion, pneumothorax. The cardiac
silhouette is within limits. No acute osseous pathology.
IMPRESSION: No active disease.

## 2020-10-30 MED ORDER — PROMETHAZINE HCL 25 MG/ML IJ SOLN: 12.5000 mg | Freq: Four times a day (QID) | INTRAMUSCULAR | Status: DC | PRN

## 2020-10-30 MED ORDER — PROMETHAZINE HCL 12.5 MG PO TABS: 12.5000 mg | ORAL_TABLET | Freq: Four times a day (QID) | ORAL | 0 refills | Status: DC | PRN

## 2020-10-30 MED ORDER — SODIUM CHLORIDE 0.9 % IV BOLUS
1000.0000 mL | Freq: Once | INTRAVENOUS | Status: AC
  Administered 2020-10-30: 16:00:00 1000 mL via INTRAVENOUS

## 2020-10-30 MED ORDER — ONDANSETRON HCL 4 MG/2ML IJ SOLN
4.0000 mg | Freq: Once | INTRAMUSCULAR | Status: AC
  Administered 2020-10-30: 16:00:00 4 mg via INTRAVENOUS
  Filled 2020-10-30: qty 2

## 2020-10-30 MED ORDER — SODIUM CHLORIDE 0.9 % IV BOLUS
500.0000 mL | Freq: Once | INTRAVENOUS | Status: AC
  Administered 2020-10-30: 18:00:00 500 mL via INTRAVENOUS

## 2020-10-30 MED ORDER — PROCHLORPERAZINE EDISYLATE 10 MG/2ML IJ SOLN
10.0000 mg | Freq: Once | INTRAMUSCULAR | Status: AC
  Administered 2020-10-30: 18:00:00 10 mg via INTRAVENOUS
  Filled 2020-10-30: qty 2

## 2020-10-30 MED ORDER — DIPHENHYDRAMINE HCL 50 MG/ML IJ SOLN
12.5000 mg | Freq: Once | INTRAMUSCULAR | Status: AC
  Administered 2020-10-30: 18:00:00 12.5 mg via INTRAVENOUS
  Filled 2020-10-30: qty 1

## 2020-10-30 NOTE — ED Provider Notes (Signed)
[facility] Emergency Department Provider Note    Event Date/Time   First MD Initiated Contact with Patient 10/30/20 [phone]     (approximate)  I have reviewed the triage vital signs and the nursing notes.   HISTORY  Chief Complaint Chest Pain    HPI Luke Hippe. is a 59 y.o. male the below listed past medical history presents to the ER for evaluation of nausea vomiting chest pain headache chills that started about 45 minutes ago after he was eating seafood.  States that chest pain started after vomiting feels like heartburn.  Has been having multiple episodes of nonbilious nonbloody emesis.  No measured fevers.    Past Medical History:  Diagnosis Date  . Angioedema 08/07/2019  . Arthritis    knees,shoulders, ankles, most joints  . Cervical disc disorder    difficuly moving neck left  . COPD (chronic obstructive pulmonary disease) (HCC)    chronic cough and wheezing  . Dyspnea    easily  . GERD (gastroesophageal reflux disease)   . Hemorrhoids   . History of hiatal hernia   . Hypertension    controlled on meds  . Pneumonia 04/2019  . Prostate disorder   . PTSD (post-traumatic stress disorder)   . Schizophrenia ([place])    Per patient's report.   Family History  Problem Relation Age of Onset  . Heart disease Mother   . Osteoporosis Mother   . Heart disease Father   . Emphysema Father   . Heart disease Sister   . Emphysema Maternal Grandmother   . Heart disease Maternal Grandfather   . Osteoporosis Sister    Past Surgical History:  Procedure Laterality Date  . CATARACT EXTRACTION W/PHACO Left 03/28/2020   Procedure: CATARACT EXTRACTION PHACO AND INTRAOCULAR LENS PLACEMENT ([place]) LEFT MALYUGIN  MILOOP,   5.29  00:41.0;  Surgeon: Birder Robson, MD;  Location: [place];  Service: Ophthalmology;  Laterality: Left;  . EYE SURGERY Left    injury from a nail  . FOOT SURGERY Left    Patient Active Problem List   Diagnosis  Date Noted  . Dizziness 08/28/2020  . Erectile dysfunction 03/22/2020  . Aortic atherosclerosis ([place]) 09/29/2019  . Bilateral primary osteoarthritis of knee 07/01/2019  . Primary osteoarthritis of right knee 07/01/2019  . Primary osteoarthritis of left knee 07/01/2019  . COPD (chronic obstructive pulmonary disease) ([place]) 06/17/2019  . Controlled substance agreement signed 05/05/2019  . Chronic pain syndrome 04/28/2019  . BPH (benign prostatic hyperplasia) 04/28/2019  . Eczema 04/28/2019  . Uncontrolled hypertension 04/28/2019  . GERD (gastroesophageal reflux disease) 04/28/2019  . Schizophrenia ([place]) 04/28/2019  . Chronic post-traumatic stress disorder (PTSD) 04/28/2019  . Hemorrhoids 04/28/2019  . Nicotine dependence, cigarettes, w unsp disorders 04/28/2019  . Alcohol dependence ([place]) 04/28/2019      Prior to Admission medications   Medication Sig Start Date End Date Taking? Authorizing Provider  albuterol (VENTOLIN HFA) 108 (90 Base) MCG/ACT inhaler Inhale 2 puffs into the lungs every 6 (six) hours as needed for wheezing orshortness of breath. 08/10/20   Kathrine Haddock, NP  diltiazem (CARDIZEM CD) 120 MG 24 hr capsule Take 1 capsule (120 mg total) by mouth daily. 08/10/20 11/08/20  Kate Sable, MD  EPINEPHrine 0.3 mg/0.3 mL IJ SOAJ injection Inject 0.3 mLs (0.3 mg total) into the muscle as needed for anaphylaxis. 08/09/19   Nicholes Mango, MD  fentaNYL (DURAGESIC) 25 MCG/HR Place 1 patch onto the skin every other day.  Patient  not taking: Reported on 10/19/2020    [provider]  finasteride (PROSCAR) 5 MG tablet Take 1 tablet (5 mg total) by mouth daily. 11/24/19   [name], [name], NP  hydrocortisone (ANUSOL-HC) 2.5 % rectal cream Place 1 application rectally 2 (two) times daily. Patient taking differently: Place 1 application rectally 2 (two) times daily as needed.  07/29/19   [name], [name], NP  Ipratropium-Albuterol (COMBIVENT) 20-100 MCG/ACT AERS respimat Inhale 1  puff into the lungs every 6 (six) hours. 06/15/19   Johnn Hai, PA-C  montelukast (SINGULAIR) 10 MG tablet Take 1 tablet (10 mg total) by mouth at bedtime. 10/19/20   [name], [name], NP  omeprazole (PRILOSEC) 40 MG capsule Take 1 capsule (40 mg total) by mouth daily. 10/13/20   [name], [name], NP  promethazine (PHENERGAN) 12.5 MG tablet Take 1 tablet (12.5 mg total) by mouth every 6 (six) hours as needed. 10/30/20   Merlyn Lot, MD  sildenafil (VIAGRA) 50 MG tablet Take 1 tablet (50 mg total) by mouth daily as needed for erectile dysfunction. 10/19/20   [name], [name], NP  tamsulosin (FLOMAX) 0.4 MG CAPS capsule Take 1 capsule (0.4 mg total) by mouth daily. 10/13/20   [name], [name], NP  Tiotropium Bromide-Olodaterol (STIOLTO RESPIMAT) 2.5-2.5 MCG/ACT AERS Inhale into the lungs every 6 (six) hours.    [provider]  triamcinolone cream (KENALOG) 0.1 % Apply 1 application topically 2 (two) times daily. 08/04/20   [name], [name], NP  carvedilol (COREG) 3.125 MG tablet Take 1 tablet (3.125 mg total) by mouth 2 (two) times daily. 07/03/20 08/10/20  Kate Sable, MD  cloNIDine (CATAPRES) 0.3 MG tablet Take 1 tablet (0.3 mg total) by mouth 2 (two) times daily. 07/03/20 08/10/20  Kate Sable, MD    Allergies Lisinopril, Amlodipine, and Penicillins    Social History Social History   Tobacco Use  . Smoking status: Current Every Day Smoker    Packs/day: 1.00    Years: 43.00    Pack years: 43.00    Types: Cigarettes  . Smokeless tobacco: Never Used  Vaping Use  . Vaping Use: Never used  Substance Use Topics  . Alcohol use: Yes    Alcohol/week: 8.0 standard drinks    Types: 8 Cans of beer per week    Comment: beer  . Drug use: Never    Review of Systems Patient denies headaches, rhinorrhea, blurry vision, numbness, shortness of breath, chest pain, edema, cough, abdominal pain, nausea, vomiting, diarrhea, dysuria, fevers, rashes or hallucinations  unless otherwise stated above in HPI. ____________________________________________   PHYSICAL EXAM:  VITAL SIGNS: Vitals:   10/30/20 1930 10/30/20 2101  BP: (!) 161/97 (!) 158/86  Pulse: 88 84  Resp: 19 18  Temp:  98.1 F (36.7 C)  SpO2: 97% 97%    Constitutional: Alert and oriented. Actively vomiting Eyes: Conjunctivae are normal.  Head: Atraumatic. Nose: No congestion/rhinnorhea. Mouth/Throat: Mucous membranes are moist.   Neck: No stridor. Painless ROM.  Cardiovascular: Normal rate, regular rhythm. Grossly normal heart sounds.  Good peripheral circulation. Respiratory: Normal respiratory effort.  No retractions. Lungs CTAB. Gastrointestinal: Soft and nontender. No distention. No abdominal bruits. No CVA tenderness. Genitourinary:  Musculoskeletal: No lower extremity tenderness nor edema.  No joint effusions. Neurologic:  Normal speech and language. No gross focal neurologic deficits are appreciated. No facial droop Skin:  Skin is warm, dry and intact. No rash noted. Psychiatric: Mood and affect are normal.   ____________________________________________   LABS (  all labs ordered are listed, but only abnormal results are displayed)  Results for orders placed or performed during the hospital encounter of 10/30/20 (from the past 24 hour(s))  Lipase, blood     Status: None   Collection Time: 10/30/20  4:01 PM  Result Value Ref Range   Lipase 24 11 - 51 U/L  Resp Panel by RT-PCR (Flu A&B, Covid) Nasopharyngeal Swab     Status: None   Collection Time: 10/30/20  4:04 PM   Specimen: Nasopharyngeal Swab; Nasopharyngeal(NP) swabs in vial transport medium  Result Value Ref Range   SARS Coronavirus 2 by RT PCR NEGATIVE NEGATIVE   Influenza A by PCR NEGATIVE NEGATIVE   Influenza B by PCR NEGATIVE NEGATIVE  CBC with Differential     Status: Abnormal   Collection Time: 10/30/20  4:04 PM  Result Value Ref Range   WBC 12.6 (H) 4.0 - 10.5 K/uL   RBC 5.73 4.22 - 5.81 MIL/uL    Hemoglobin 18.0 (H) 13.0 - 17.0 g/dL   HCT 53.5 (H) 39.0 - 52.0 %   MCV 93.4 80.0 - 100.0 fL   MCH 31.4 26.0 - 34.0 pg   MCHC 33.6 30.0 - 36.0 g/dL   RDW 12.9 11.5 - 15.5 %   Platelets 343 150 - 400 K/uL   nRBC 0.0 0.0 - 0.2 %   Neutrophils Relative % 73 %   Neutro Abs 9.3 (H) 1.7 - 7.7 K/uL   Lymphocytes Relative 21 %   Lymphs Abs 2.7 0.7 - 4.0 K/uL   Monocytes Relative 3 %   Monocytes Absolute 0.4 0.1 - 1.0 K/uL   Eosinophils Relative 1 %   Eosinophils Absolute 0.1 0.0 - 0.5 K/uL   Basophils Relative 1 %   Basophils Absolute 0.1 0.0 - 0.1 K/uL   Immature Granulocytes 1 %   Abs Immature Granulocytes 0.08 (H) 0.00 - 0.07 K/uL  Comprehensive metabolic panel     Status: Abnormal   Collection Time: 10/30/20  4:04 PM  Result Value Ref Range   Sodium 140 135 - 145 mmol/L   Potassium 4.1 3.5 - 5.1 mmol/L   Chloride 100 98 - 111 mmol/L   CO2 26 22 - 32 mmol/L   Glucose, Bld 114 (H) 70 - 99 mg/dL   BUN 13 6 - 20 mg/dL   Creatinine, Ser 1.26 (H) 0.61 - 1.24 mg/dL   Calcium 9.6 8.9 - 10.3 mg/dL   Total Protein 8.8 (H) 6.5 - 8.1 g/dL   Albumin 4.4 3.5 - 5.0 g/dL   AST 33 15 - 41 U/L   ALT 29 0 - 44 U/L   Alkaline Phosphatase 92 38 - 126 U/L   Total Bilirubin 1.1 0.3 - 1.2 mg/dL   GFR, Estimated >60 >60 mL/min   Anion gap 14 5 - 15  Troponin I (High Sensitivity)     Status: None   Collection Time: 10/30/20  4:04 PM  Result Value Ref Range   Troponin I (High Sensitivity) 11 <18 ng/L  Troponin I (High Sensitivity)     Status: None   Collection Time: 10/30/20  6:01 PM  Result Value Ref Range   Troponin I (High Sensitivity) 11 <18 ng/L   ____________________________________________  EKG My review and personal interpretation at Time: 15:56   Indication: vomiting  Rate: 127  Rhythm: sinus Axis: normal Other: nonspecific st abn, no stemi ____________________________________________  RADIOLOGY  I personally reviewed all radiographic images ordered to evaluate for the above acute  complaints  and reviewed radiology reports and findings.  These findings were personally discussed with the patient.  Please see medical record for radiology report.  ____________________________________________   PROCEDURES  Procedure(s) performed:  Procedures    Critical Care performed: no ____________________________________________   INITIAL IMPRESSION / ASSESSMENT AND PLAN / ED COURSE  Pertinent labs & imaging results that were available during my care of the patient were reviewed by me and considered in my medical decision making (see chart for details).   DDX: Enteritis, gastritis, colitis, SBO, cholecystitis, cholelithiasis, dehydration, foodborne illness, medication reaction  Luke Armwood. is a 59 y.o. who presents to the ED with presentation as described above.  Given the acute onset after eating seafood suspect foodborne illness.  He is nontoxic-appearing will give IV fluids as well as IV antiemetics and reassess.  His abdominal exam is soft and benign.  Have a low suspicion for SBO, biliary pathology or appendicitis.  Clinical Course as of 10/30/20 2131  Mon Oct 30, 2020  1759 Patient states still feeling somewhat nauseated but significantly improved.  I suspect he has some form of foodborne illness given the rapidity of onset after eating seafood.  Still awaiting lipase and repeat troponin.  Will give additional antiemetic. [PR]  1931 Patient reassessed. Feels much improved. Heart rate now 75. Repeat abdominal exam soft benign. Remainder blood work is reassuring. Not consistent with ACS.  Will p.o. challenge c  [PR]  2012 Patient feels improved.  Tolerating p.o.  Does appear stable and appropriate for outpatient follow-up.  Have discussed with the patient and available family all diagnostics and treatments performed thus far and all questions were answered to the best of my ability. The patient demonstrates understanding and agreement with plan.  [PR]    Clinical  Course User Index [PR] Merlyn Lot, MD    The patient was evaluated in Emergency Department today for the symptoms described in the history of present illness. He/she was evaluated in the context of the global COVID-19 pandemic, which necessitated consideration that the patient might be at risk for infection with the SARS-CoV-2 virus that causes COVID-19. Institutional protocols and algorithms that pertain to the evaluation of patients at risk for COVID-19 are in a state of rapid change based on information released by regulatory bodies including the CDC and federal and state organizations. These policies and algorithms were followed during the patient's care in the ED.  As part of my medical decision making, I reviewed the following data within the [place] notes reviewed and incorporated, Labs reviewed, notes from prior ED visits and [place] Controlled Substance Database   ____________________________________________   FINAL CLINICAL IMPRESSION(S) / ED DIAGNOSES  Final diagnoses:  Non-intractable vomiting with nausea, unspecified vomiting type      NEW MEDICATIONS STARTED DURING THIS VISIT:  Discharge Medication List as of 10/30/2020  8:53 PM    START taking these medications   Details  promethazine (PHENERGAN) 12.5 MG tablet Take 1 tablet (12.5 mg total) by mouth every 6 (six) hours as needed., Starting Mon 10/30/2020, Normal         Note:  This document was prepared using Dragon voice recognition software and may include unintentional dictation errors.    Merlyn Lot, MD 10/30/20 2131

## 2020-10-30 NOTE — ED Notes (Signed)
Pt given lemon-lime soda for PO challenge. Pt able to drink full cup with no difficulty. Dr Quentin Cornwall notified.

## 2020-10-30 NOTE — ED Triage Notes (Signed)
Pt via pov from home with cp, sob, diaphoresis, emesis x 30 minutes. Pt denies any cardiac hx, endorses htn. Pt very sweaty and nauseated, ate seafood at [employer] around noon today. Pt recently started suboxone - 2 days ago. Pt alert & oriented, nad noted.

## 2020-10-30 NOTE — ED Notes (Signed)
E signature pad not working. Pt educated on discharge instructions and verbalized understanding.  

## 2020-11-01 ENCOUNTER — Ambulatory Visit
Admission: RE | Admit: 2020-11-01 | Discharge: 2020-11-01 | Disposition: A | Discharge status: Home / Self Care | Payer: Medicaid Other | Source: Ambulatory Visit | Attending: Otolaryngology | Admitting: Otolaryngology

## 2020-11-01 ENCOUNTER — Other Ambulatory Visit: Payer: Self-pay

## 2020-11-01 DIAGNOSIS — R42 Dizziness and giddiness: Secondary | ICD-10-CM | POA: Diagnosis present

## 2020-11-01 IMAGING — MR MR MRA NECK WO/W CM
6 series · 43 of 48 positions shown · IV contrast (9 GADAVIST)
Comparison: Carotid Doppler ultrasound [DATE]. Neck CTA
[DATE].

CLINICAL DATA: Dizziness, headache, and tinnitus.

EXAM:
MRA NECK WITHOUT AND WITH CONTRAST
TECHNIQUE: Multiplanar and multiecho pulse sequences of the neck were obtained
without and with intravenous contrast. Angiographic images of the
neck were obtained using MRA technique without and with intravenous
contrast.
CONTRAST:  9mL GADAVIST GADOBUTROL 1 MMOL/ML IV SOLN

[Series 3: TOF · axial · 3.0mm · 0.86mm/px · z∈[-53,+106]mm · 12 of 80 slices shown]
[im 1/80]
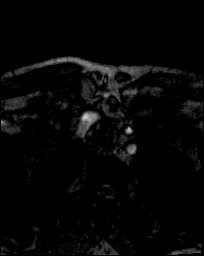
[im 8/80]
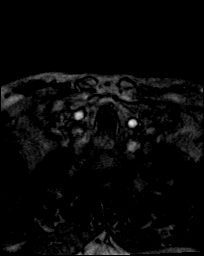
[im 15/80]
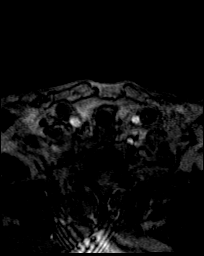
[im 22/80]
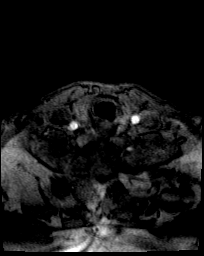
[im 29/80]
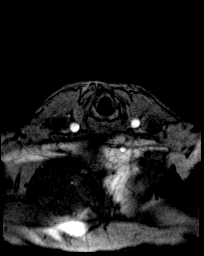
[im 36/80]
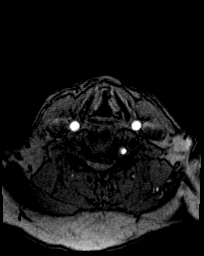
[im 44/80]
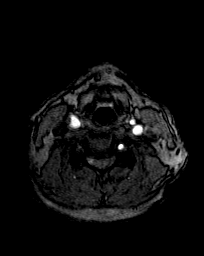
[im 51/80]
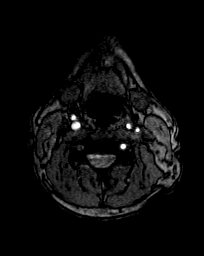
[im 58/80]
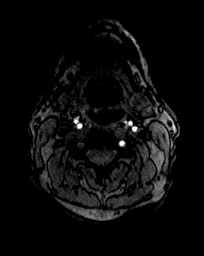
[im 65/80]
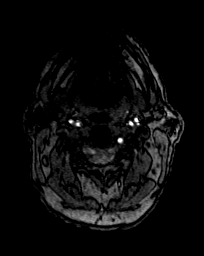
[im 72/80]
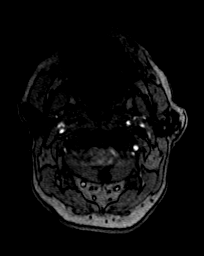
[im 80/80]
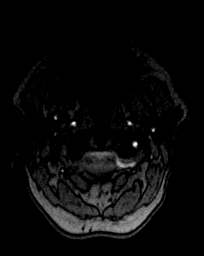

[Series 7: (id)_pre · coronal · 0.7mm · 0.78mm/px · 11 of 88 slices shown]
[im 1/88]
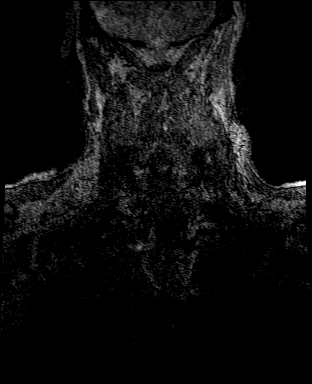
[im 8/88]
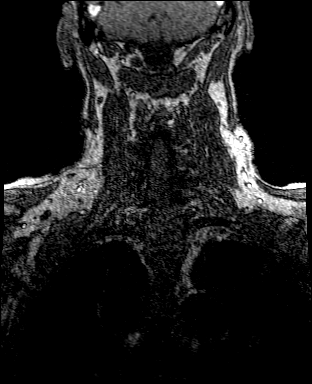
[im 15/88]
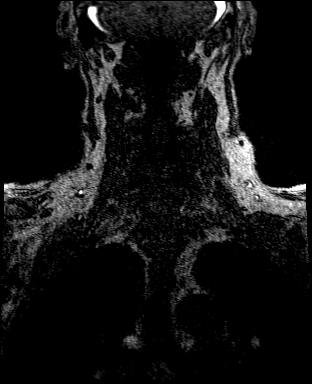
[im 22/88]
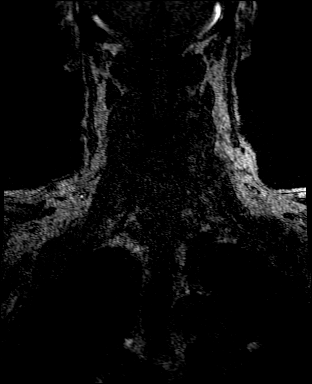
[im 30/88]
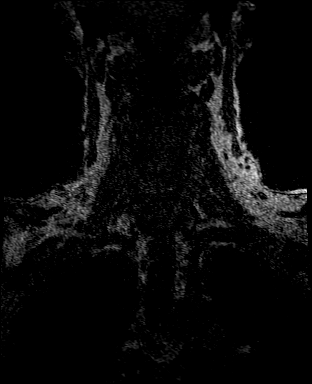
[im 37/88]
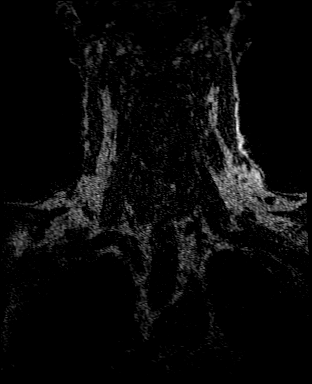
[im 44/88]
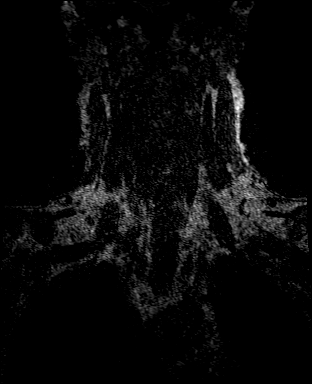
[im 51/88]
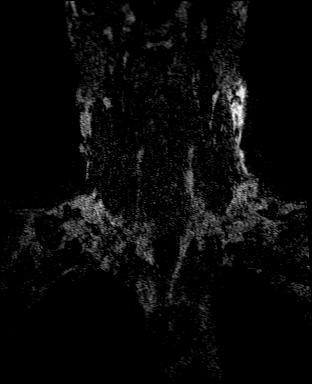
[im 59/88]
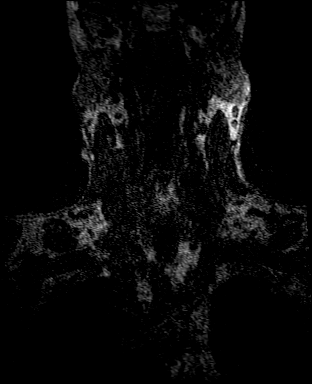
[im 73/88]
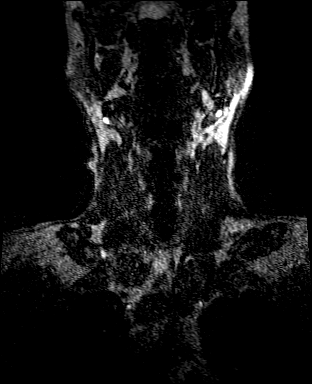
[im 88/88]
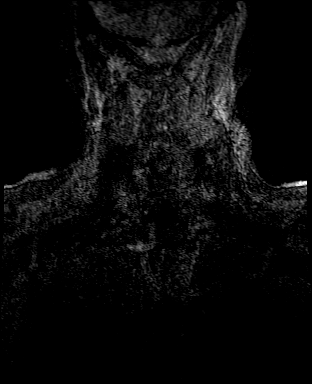

[Series 9: (id)_post · coronal · 0.7mm · 0.78mm/px · 9 of 62 slices shown]
[im 1/62]
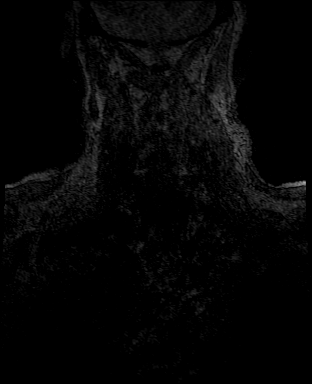
[im 8/62]
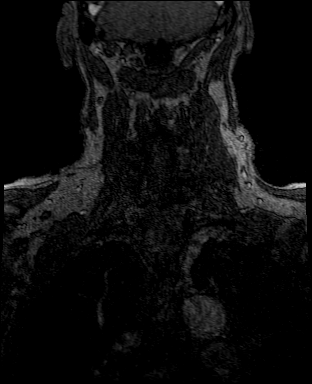
[im 16/62]
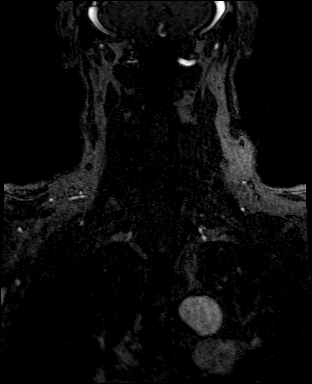
[im 23/62]
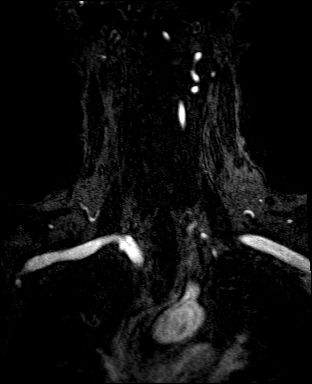
[im 31/62]
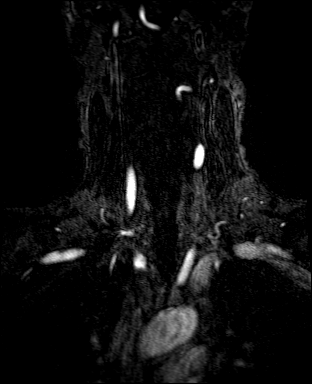
[im 39/62]
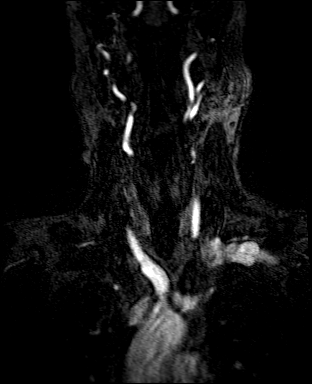
[im 46/62]
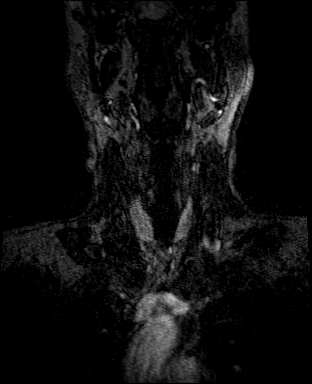
[im 54/62]
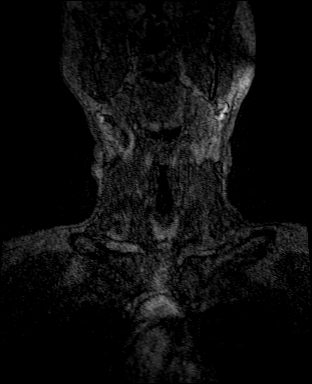
[im 62/62]
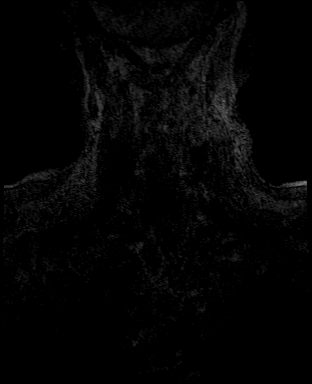

[Series 10: (id)_post_sub · coronal · 0.7mm · 0.78mm/px · 9 of 75 slices shown]
[im 1/75]
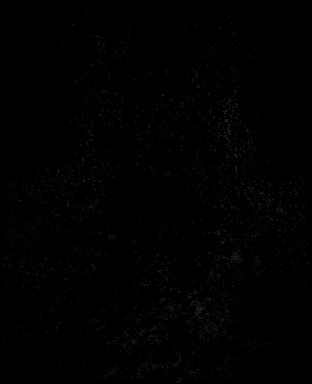
[im 14/75]
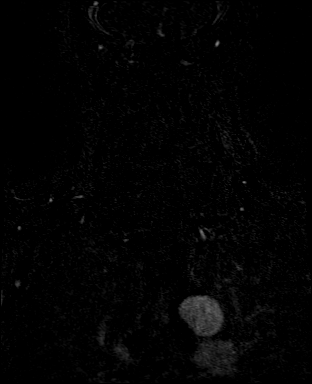
[im 21/75]
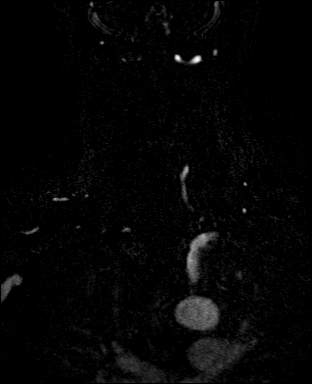
[im 34/75]
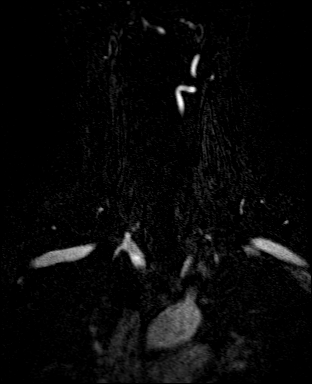
[im 41/75]
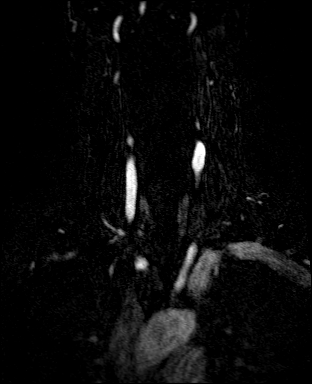
[im 54/75]
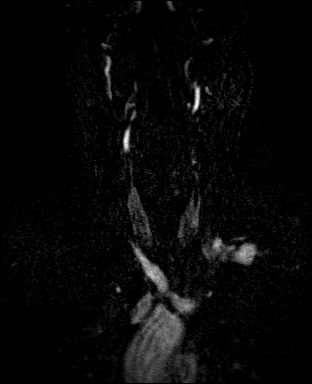
[im 61/75]
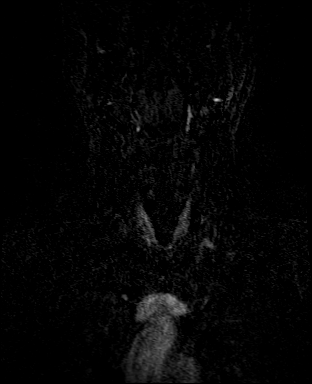
[im 68/75]
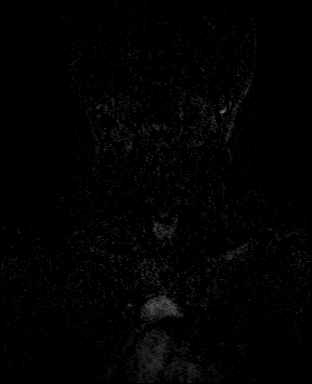
[im 75/75]
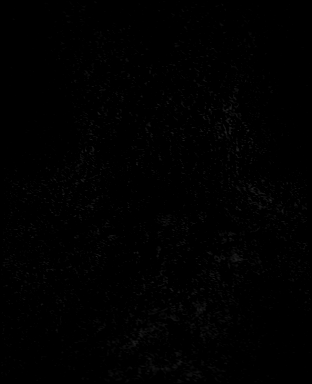

[Series 101: lcca · axial · 0.62mm/px · 1 of 1 slices shown]
[im 1/1]
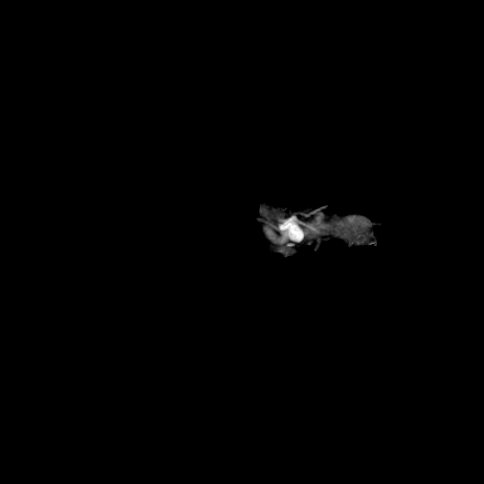

[Series 102: rcca · axial · 0.62mm/px · 1 of 1 slices shown]
[im 1/1]
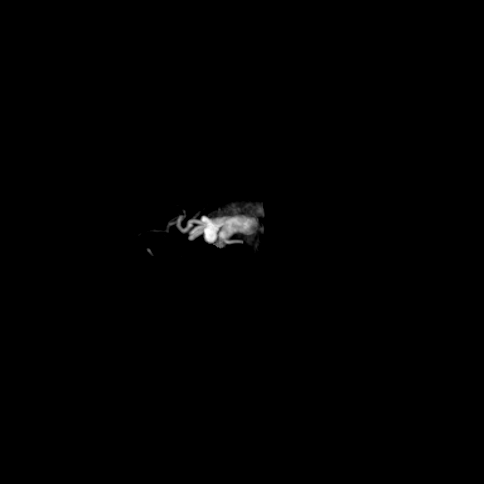

[43 of 48 positions shown; findings below may reference images not displayed]

FINDINGS: There is a standard 3 vessel aortic arch. The brachiocephalic and
subclavian arteries are widely patent.

The cervical carotid arteries are patent without evidence of
dissection or significant common carotid stenosis. There is mild
luminal irregularity at the level of the right carotid bulb likely
reflecting atherosclerosis without significant stenosis. There is
50% stenosis of the left ICA immediately distal to the carotid bulb,
new from the [IZ] CTA.

The left vertebral artery is patent with antegrade flow and no
evidence of significant stenosis or dissection. The right vertebral
artery is occluded at its origin, new from [IZ]. There is
reconstitution of a small vessel with thready flow in the distal V2,
V3, and V4 segments.
IMPRESSION: 1. 50% proximal left ICA stenosis.
2. Occlusion of the right vertebral artery at its origin with
minimal reconstituted flow distally.
3. Widely patent left vertebral artery.

## 2020-11-01 MED ORDER — GADOBUTROL 1 MMOL/ML IV SOLN
9.0000 mL | Freq: Once | INTRAVENOUS | Status: AC | PRN
  Administered 2020-11-01: 09:00:00 9 mL via INTRAVENOUS

## 2020-11-03 ENCOUNTER — Other Ambulatory Visit: Payer: Self-pay | Admitting: Nurse Practitioner

## 2020-11-03 DIAGNOSIS — I6501 Occlusion and stenosis of right vertebral artery: Secondary | ICD-10-CM

## 2020-11-03 NOTE — Progress Notes (Signed)
Referral to vein and vascular per ENT request.

## 2020-11-06 ENCOUNTER — Telehealth (INDEPENDENT_AMBULATORY_CARE_PROVIDER_SITE_OTHER): Payer: Self-pay | Admitting: Gastroenterology

## 2020-11-06 DIAGNOSIS — Z1211 Encounter for screening for malignant neoplasm of colon: Secondary | ICD-10-CM

## 2020-11-06 MED ORDER — NA SULFATE-K SULFATE-MG SULF 17.5-3.13-1.6 GM/177ML PO SOLN
1.0000 | Freq: Once | ORAL | 0 refills | Status: AC
Start: 2020-11-06 — End: 2020-11-06

## 2020-11-06 NOTE — Progress Notes (Signed)
Gastroenterology Pre-Procedure Review  Request Date: Thursday 12/14/20 Requesting Physician: Dr. Marius Ditch  PATIENT REVIEW QUESTIONS: The patient responded to the following health history questions as indicated:    1. Are you having any GI issues? no 2. Do you have a personal history of Polyps? no 3. Do you have a family history of Colon Cancer or Polyps? no 4. Diabetes Mellitus? no 5. Joint replacements in the past 12 months?no 6. Major health problems in the past 3 months?10/30/20 ER Visit N/V 7. Any artificial heart valves, MVP, or defibrillator?no    MEDICATIONS & ALLERGIES:    Patient reports the following regarding taking any anticoagulation/antiplatelet therapy:   Plavix, Coumadin, Eliquis, Xarelto, Lovenox, Pradaxa, Brilinta, or Effient? no Aspirin? no  Patient confirms/reports the following medications:  Current Outpatient Medications  Medication Sig Dispense Refill   albuterol (VENTOLIN HFA) 108 (90 Base) MCG/ACT inhaler Inhale 2 puffs into the lungs every 6 (six) hours as needed for wheezing orshortness of breath. 18 g 0   diltiazem (CARDIZEM CD) 120 MG 24 hr capsule Take 1 capsule (120 mg total) by mouth daily. 30 capsule 5   EPINEPHrine 0.3 mg/0.3 mL IJ SOAJ injection Inject 0.3 mLs (0.3 mg total) into the muscle as needed for anaphylaxis. 1 each 1   finasteride (PROSCAR) 5 MG tablet Take 1 tablet (5 mg total) by mouth daily. 90 tablet 2   hydrocortisone (ANUSOL-HC) 2.5 % rectal cream Place 1 application rectally 2 (two) times daily. (Patient taking differently: Place 1 application rectally 2 (two) times daily as needed.) 30 g 0   Ipratropium-Albuterol (COMBIVENT) 20-100 MCG/ACT AERS respimat Inhale 1 puff into the lungs every 6 (six) hours. 4 g 1   montelukast (SINGULAIR) 10 MG tablet Take 1 tablet (10 mg total) by mouth at bedtime. 90 tablet 3   omeprazole (PRILOSEC) 40 MG capsule Take 1 capsule (40 mg total) by mouth daily. 90 capsule 0   sildenafil (VIAGRA) 50 MG  tablet Take 1 tablet (50 mg total) by mouth daily as needed for erectile dysfunction. 5 tablet 2   tamsulosin (FLOMAX) 0.4 MG CAPS capsule Take 1 capsule (0.4 mg total) by mouth daily. 90 capsule 0   Tiotropium Bromide-Olodaterol (STIOLTO RESPIMAT) 2.5-2.5 MCG/ACT AERS Inhale into the lungs every 6 (six) hours.     triamcinolone cream (KENALOG) 0.1 % Apply 1 application topically 2 (two) times daily. 30 g 0   fentaNYL (DURAGESIC) 25 MCG/HR Place 1 patch onto the skin every other day.  (Patient not taking: Reported on 11/06/2020)     promethazine (PHENERGAN) 12.5 MG tablet Take 1 tablet (12.5 mg total) by mouth every 6 (six) hours as needed. (Patient not taking: No sig reported) 12 tablet 0   No current facility-administered medications for this visit.    Patient confirms/reports the following allergies:  Allergies  Allergen Reactions   Lisinopril Swelling    Reaction: angioedema   Amlodipine     Dizziness and edema with this   Penicillins Rash    Reaction: Feels like skin is on fire Did it involve swelling of the face/tongue/throat, SOB, or low BP? No Did it involve sudden or severe rash/hives, skin peeling, or any reaction on the inside of your mouth or nose? No Did you need to seek medical attention at a hospital or doctor's office? Yes When did it last happen?30 years ago If all above answers are NO, may proceed with cephalosporin use.     Orders Placed This Encounter  Procedures   Procedural/ Surgical  Case Request: COLONOSCOPY WITH PROPOFOL    Standing Status:   Standing    Number of Occurrences:   1    Order Specific Question:   Pre-op diagnosis    Answer:   screening colonoscopy    Order Specific Question:   CPT Code    Answer:   69629    AUTHORIZATION INFORMATION Primary Insurance: 1D#: Group #:  Secondary Insurance: 1D#: Group #:  SCHEDULE INFORMATION: Date: 12/14/20 Time: Location:[facility]

## 2020-11-07 ENCOUNTER — Telehealth: Payer: Self-pay | Admitting: Nurse Practitioner

## 2020-11-14 ENCOUNTER — Other Ambulatory Visit: Payer: Self-pay | Admitting: Nurse Practitioner

## 2020-11-14 NOTE — Telephone Encounter (Signed)
Pt made aware but did state he did not want to schedule apt now as he will call to get this scheduled when he knows his schedule with his \ Other apts with different doctors .

## 2020-11-14 NOTE — Telephone Encounter (Signed)
   Notes to clinic: medication filled by historical provider  Review for refill   Requested Prescriptions  Pending Prescriptions Disp Refills   STIOLTO RESPIMAT 2.5-2.5 MCG/ACT AERS [Pharmacy Med Name: STIOLTO RESPIMAT INHAL SPRAY] 4 g 0    Sig: Inhale 2 puffs into the lungs daily.      Off-Protocol Failed - 11/14/2020  9:53 AM      Failed - Medication not assigned to a protocol, review manually.      Passed - Valid encounter within last 12 months    Recent Outpatient Visits           3 weeks ago Acute bronchitis with COPD [facility])   [facility] Caro Laroche, DO   2 months ago Dizziness   [facility] Valentino Nose, NP   7 months ago Preoperative clearance   [facility] [place], Corrie Dandy T, NP   9 months ago Centrilobular emphysema (HCC)   [facility] [name], Corrie Dandy T, NP   10 months ago Centrilobular emphysema [facility])   [facility] [place], Dorie Rank, NP

## 2020-11-20 ENCOUNTER — Other Ambulatory Visit: Payer: Self-pay | Admitting: Nurse Practitioner

## 2020-12-04 ENCOUNTER — Encounter (INDEPENDENT_AMBULATORY_CARE_PROVIDER_SITE_OTHER): Payer: Self-pay | Admitting: Vascular Surgery

## 2020-12-12 ENCOUNTER — Other Ambulatory Visit: Payer: Self-pay

## 2020-12-12 ENCOUNTER — Other Ambulatory Visit
Admission: RE | Admit: 2020-12-12 | Discharge: 2020-12-12 | Disposition: A | Discharge status: Home / Self Care | Payer: Medicaid Other | Source: Ambulatory Visit | Attending: Gastroenterology | Admitting: Gastroenterology

## 2020-12-12 DIAGNOSIS — Z20822 Contact with and (suspected) exposure to covid-19: Secondary | ICD-10-CM | POA: Diagnosis not present

## 2020-12-12 DIAGNOSIS — Z01812 Encounter for preprocedural laboratory examination: Secondary | ICD-10-CM | POA: Diagnosis not present

## 2020-12-12 LAB — SARS CORONAVIRUS 2 (TAT 6-24 HRS): SARS Coronavirus 2: NEGATIVE

## 2020-12-14 ENCOUNTER — Encounter: Payer: Self-pay | Admitting: Gastroenterology

## 2020-12-14 ENCOUNTER — Other Ambulatory Visit: Payer: Self-pay

## 2020-12-14 ENCOUNTER — Ambulatory Visit: Payer: Medicaid Other | Admitting: Registered Nurse

## 2020-12-14 ENCOUNTER — Encounter
Admission: RE | Disposition: A | Discharge status: Home / Self Care | Payer: Self-pay | Source: Home / Self Care | Attending: Gastroenterology

## 2020-12-14 ENCOUNTER — Ambulatory Visit
Admission: RE | Admit: 2020-12-14 | Discharge: 2020-12-14 | Disposition: A | Discharge status: Home / Self Care | Payer: Medicaid Other | Attending: Gastroenterology | Admitting: Gastroenterology

## 2020-12-14 DIAGNOSIS — K635 Polyp of colon: Secondary | ICD-10-CM | POA: Diagnosis not present

## 2020-12-14 DIAGNOSIS — Z880 Allergy status to penicillin: Secondary | ICD-10-CM | POA: Diagnosis not present

## 2020-12-14 DIAGNOSIS — D122 Benign neoplasm of ascending colon: Secondary | ICD-10-CM | POA: Diagnosis not present

## 2020-12-14 DIAGNOSIS — Z888 Allergy status to other drugs, medicaments and biological substances status: Secondary | ICD-10-CM | POA: Insufficient documentation

## 2020-12-14 DIAGNOSIS — Z79899 Other long term (current) drug therapy: Secondary | ICD-10-CM | POA: Insufficient documentation

## 2020-12-14 DIAGNOSIS — Z1211 Encounter for screening for malignant neoplasm of colon: Secondary | ICD-10-CM | POA: Insufficient documentation

## 2020-12-14 DIAGNOSIS — F17210 Nicotine dependence, cigarettes, uncomplicated: Secondary | ICD-10-CM | POA: Diagnosis not present

## 2020-12-14 DIAGNOSIS — K5730 Diverticulosis of large intestine without perforation or abscess without bleeding: Secondary | ICD-10-CM | POA: Insufficient documentation

## 2020-12-14 HISTORY — PX: COLONOSCOPY WITH PROPOFOL: SHX5780

## 2020-12-14 SURGERY — COLONOSCOPY WITH PROPOFOL
Anesthesia: General

## 2020-12-14 MED ORDER — PROPOFOL 10 MG/ML IV BOLUS
INTRAVENOUS | Status: DC | PRN
  Administered 2020-12-14 (×2): 80 mg via INTRAVENOUS
  Administered 2020-12-14: 40 mg via INTRAVENOUS

## 2020-12-14 MED ORDER — PROPOFOL 500 MG/50ML IV EMUL
INTRAVENOUS | Status: DC | PRN
  Administered 2020-12-14: 140 ug/kg/min via INTRAVENOUS

## 2020-12-14 MED ORDER — SODIUM CHLORIDE 0.9 % IV SOLN: INTRAVENOUS | Status: DC

## 2020-12-14 NOTE — Anesthesia Preprocedure Evaluation (Signed)
Anesthesia Evaluation  Patient identified by MRN, date of birth, ID band Patient awake    Reviewed: Allergy & Precautions, NPO status , Patient's Chart, lab work & pertinent test results  Airway Mallampati: II  TM Distance: >3 FB Neck ROM: Full    Dental no notable dental hx.    Pulmonary shortness of breath and with exertion, COPD, Current Smoker and Patient abstained from smoking.,    Pulmonary exam normal breath sounds clear to auscultation       Cardiovascular hypertension, Normal cardiovascular exam Rhythm:Regular Rate:Normal     Neuro/Psych PSYCHIATRIC DISORDERS Anxiety Schizophrenia    GI/Hepatic Neg liver ROS, hiatal hernia, GERD  Medicated and Controlled,  Endo/Other  negative endocrine ROS  Renal/GU negative Renal ROS  negative genitourinary   Musculoskeletal  (+) Arthritis ,   Abdominal Normal abdominal exam  (+)   Peds negative pediatric ROS (+)  Hematology negative hematology ROS (+)   Anesthesia Other Findings Past Medical History: 08/07/2019: Angioedema No date: Arthritis     Comment:  knees,shoulders, ankles, most joints No date: Cervical disc disorder     Comment:  difficuly moving neck left No date: COPD (chronic obstructive pulmonary disease) (HCC)     Comment:  chronic cough and wheezing No date: Dyspnea     Comment:  easily No date: GERD (gastroesophageal reflux disease) No date: Hemorrhoids No date: History of hiatal hernia No date: Hypertension     Comment:  controlled on meds 04/2019: Pneumonia No date: Prostate disorder No date: PTSD (post-traumatic stress disorder) No date: Schizophrenia ([place])     Comment:  Per patient's report.  Reproductive/Obstetrics negative OB ROS                             Anesthesia Physical  Anesthesia Plan  ASA: II  Anesthesia Plan: General   Post-op Pain Management:    Induction: Intravenous  PONV Risk Score and  Plan: 0 and TIVA, Midazolam and Treatment may vary due to age or medical condition  Airway Management Planned: Natural Airway and Nasal Cannula  Additional Equipment:   Intra-op Plan:   Post-operative Plan:   Informed Consent: I have reviewed the patients History and Physical, chart, labs and discussed the procedure including the risks, benefits and alternatives for the proposed anesthesia with the patient or authorized representative who has indicated his/her understanding and acceptance.     Dental advisory given  Plan Discussed with: CRNA  Anesthesia Plan Comments:         Anesthesia Quick Evaluation

## 2020-12-14 NOTE — Transfer of Care (Signed)
Immediate Anesthesia Transfer of Care Note  Patient: Luke Briggs.  Procedure(s) Performed: COLONOSCOPY WITH PROPOFOL (N/A )  Patient Location: PACU  Anesthesia Type:General  Level of Consciousness: awake, alert  and oriented  Airway & Oxygen Therapy: Patient Spontanous Breathing and Patient connected to nasal cannula oxygen  Post-op Assessment: Report given to RN and Post -op Vital signs reviewed and stable  Post vital signs: Reviewed and stable  Last Vitals:  Vitals Value Taken Time  BP 110/74 12/14/20 0853  Temp    Pulse 69 12/14/20 0853  Resp 16 12/14/20 0853  SpO2 95 % 12/14/20 0853    Last Pain:  Vitals:   12/14/20 0729  TempSrc: Temporal         Complications: No complications documented.

## 2020-12-14 NOTE — H&P (Signed)
Jonathon Bellows, MD [address], [place], [place], [place], [address] [address], [place], [place], [place], [address] Phone: [phone]  Fax: [phone]  Primary Care Physician:  [name], NP   Pre-Procedure History & Physical: HPI:  Luke Briggs. is a 60 y.o. male is here for an colonoscopy.   Past Medical History:  Diagnosis Date  . Angioedema 08/07/2019  . Arthritis    knees,shoulders, ankles, most joints  . Cervical disc disorder    difficuly moving neck left  . COPD (chronic obstructive pulmonary disease) (HCC)    chronic cough and wheezing  . Dyspnea    easily  . GERD (gastroesophageal reflux disease)   . Hemorrhoids   . History of hiatal hernia   . Hypertension    controlled on meds  . Pneumonia 04/2019  . Prostate disorder   . PTSD (post-traumatic stress disorder)   . Schizophrenia ([place])    Per patient's report.    Past Surgical History:  Procedure Laterality Date  . CATARACT EXTRACTION W/PHACO Left 03/28/2020   Procedure: CATARACT EXTRACTION PHACO AND INTRAOCULAR LENS PLACEMENT ([place]) LEFT MALYUGIN  MILOOP,   5.29  00:41.0;  Surgeon: Birder Robson, MD;  Location: [facility];  Service: Ophthalmology;  Laterality: Left;  . EYE SURGERY Left    injury from a nail  . FOOT SURGERY Left     Prior to Admission medications   Medication Sig Start Date End Date Taking? Authorizing Provider  albuterol (VENTOLIN HFA) 108 (90 Base) MCG/ACT inhaler Inhale 2 puffs into the lungs every 6 (six) hours as needed for wheezing orshortness of breath. 08/10/20  Yes Kathrine Haddock, NP  diltiazem (CARDIZEM CD) 120 MG 24 hr capsule Take 1 capsule (120 mg total) by mouth daily. 08/10/20 11/08/20 Yes Agbor-Etang, Aaron Edelman, MD  finasteride (PROSCAR) 5 MG tablet Take 1 tablet (5 mg total) by mouth daily. 11/20/20  Yes [name], [name] T, NP  hydrocortisone (ANUSOL-HC) 2.5 % rectal cream Place 1 application rectally 2 (two) times daily. Patient taking  differently: Place 1 application rectally 2 (two) times daily as needed. 07/29/19  Yes [name], [name] T, NP  montelukast (SINGULAIR) 10 MG tablet Take 1 tablet (10 mg total) by mouth at bedtime. 10/19/20  Yes [name], [name] T, NP  omeprazole (PRILOSEC) 40 MG capsule Take 1 capsule (40 mg total) by mouth daily. 10/13/20  Yes [name], [name] T, NP  tamsulosin (FLOMAX) 0.4 MG CAPS capsule Take 1 capsule (0.4 mg total) by mouth daily. 10/13/20  Yes [name], [name] T, NP  triamcinolone cream (KENALOG) 0.1 % Apply 1 application topically 2 (two) times daily. 08/04/20  Yes [name], [name] T, NP  EPINEPHrine 0.3 mg/0.3 mL IJ SOAJ injection Inject 0.3 mLs (0.3 mg total) into the muscle as needed for anaphylaxis. 08/09/19   Nicholes Mango, MD  fentaNYL (DURAGESIC) 25 MCG/HR Place 1 patch onto the skin every other day.  Patient not taking: Reported on 11/06/2020    [provider]  Ipratropium-Albuterol (COMBIVENT) 20-100 MCG/ACT AERS respimat Inhale 1 puff into the lungs every 6 (six) hours. 06/15/19   Johnn Hai, PA-C  promethazine (PHENERGAN) 12.5 MG tablet Take 1 tablet (12.5 mg total) by mouth every 6 (six) hours as needed. Patient not taking: No sig reported 10/30/20   Merlyn Lot, MD  sildenafil (VIAGRA) 50 MG tablet Take 1 tablet (50 mg total) by mouth daily as needed for erectile dysfunction. 10/19/20   [name], NP  STIOLTO RESPIMAT  2.5-2.5 MCG/ACT AERS Inhale 2 puffs into the lungs daily. Patient not taking: Reported on 12/14/2020 11/14/20   Marnee Guarneri T, NP  carvedilol (COREG) 3.125 MG tablet Take 1 tablet (3.125 mg total) by mouth 2 (two) times daily. 07/03/20 08/10/20  Kate Sable, MD  cloNIDine (CATAPRES) 0.3 MG tablet Take 1 tablet (0.3 mg total) by mouth 2 (two) times daily. 07/03/20 08/10/20  Kate Sable, MD    Allergies as of 11/06/2020 - Review Complete 11/06/2020  Allergen Reaction Noted  . Lisinopril Swelling 08/08/2019  . Amlodipine  10/08/2019   . Penicillins Rash 04/28/2019    Family History  Problem Relation Age of Onset  . Heart disease Mother   . Osteoporosis Mother   . Heart disease Father   . Emphysema Father   . Heart disease Sister   . Emphysema Maternal Grandmother   . Heart disease Maternal Grandfather   . Osteoporosis Sister     Social History   Socioeconomic History  . Marital status: Significant Other    Spouse name: Not on file  . Number of children: Not on file  . Years of education: Not on file  . Highest education level: Not on file  Occupational History  . Not on file  Tobacco Use  . Smoking status: Current Every Day Smoker    Packs/day: 1.00    Years: 43.00    Pack years: 43.00    Types: Cigarettes  . Smokeless tobacco: Never Used  Vaping Use  . Vaping Use: Never used  Substance and Sexual Activity  . Alcohol use: Yes    Alcohol/week: 8.0 standard drinks    Types: 8 Cans of beer per week    Comment: beer  . Drug use: Never  . Sexual activity: Not on file  Other Topics Concern  . Not on file  Social History Narrative   Patient has new girlfriend they have been dating almost a year.    Social Determinants of Health   Financial Resource Strain: Not on file  Food Insecurity: Not on file  Transportation Needs: Not on file  Physical Activity: Not on file  Stress: Not on file  Social Connections: Not on file  Intimate Partner Violence: Not on file    Review of Systems: See HPI, otherwise negative ROS  Physical Exam: BP (!) 164/108   Pulse 92   Temp (!) 96.9 F (36.1 C) (Temporal)   Resp 18   Ht 5\' 10"  (1.778 m)   Wt 83.9 kg   SpO2 100%   BMI 26.54 kg/m  General:   Alert,  pleasant and cooperative in NAD Head:  Normocephalic and atraumatic. Neck:  Supple; no masses or thyromegaly. Lungs:  Clear throughout to auscultation, normal respiratory effort.    Heart:  +S1, +S2, Regular rate and rhythm, No edema. Abdomen:  Soft, nontender and nondistended. Normal bowel sounds,  without guarding, and without rebound.   Neurologic:  Alert and  oriented x4;  grossly normal neurologically.  Impression/Plan: Luke Briggs. is here for an colonoscopy to be performed for Screening colonoscopy average risk   Risks, benefits, limitations, and alternatives regarding  colonoscopy have been reviewed with the patient.  Questions have been answered.  All parties agreeable.   Jonathon Bellows, MD  12/14/2020, 8:12 AM

## 2020-12-14 NOTE — Op Note (Signed)
[facility] Patient Name: Luke Briggs Procedure Date: 12/14/2020 8:12 AM MRN: [id number] Account #: [id number] Date of Birth: Jul 31, 1961 Admit Type: Outpatient Age: 60 Room: [facility] ENDO ROOM 3 Gender: Male Note Status: Finalized Procedure:             Colonoscopy Indications:           Screening for colorectal malignant neoplasm Providers:             Jonathon Bellows MD, MD Referring MD:          Franchot Gallo (Referring MD) Medicines:             Monitored Anesthesia Care Complications:         No immediate complications. Procedure:             Pre-Anesthesia Assessment:                        - Prior to the procedure, a History and Physical was                         performed, and patient medications, allergies and                         sensitivities were reviewed. The patient's tolerance                         of previous anesthesia was reviewed.                        - The risks and benefits of the procedure and the                         sedation options and risks were discussed with the                         patient. All questions were answered and informed                         consent was obtained.                        - ASA Grade Assessment: II - A patient with mild                         systemic disease.                        After obtaining informed consent, the colonoscope was                         passed under direct vision. Throughout the procedure,                         the patient's blood pressure, pulse, and oxygen                         saturations were monitored continuously. The                         Colonoscope was introduced through the anus and  advanced to the the cecum, identified by the                         appendiceal orifice. The colonoscopy was performed                         with ease. The patient tolerated the procedure well.                         The quality of the  bowel preparation was good. Findings:      The perianal and digital rectal examinations were normal.      Multiple small-mouthed diverticula were found in the sigmoid colon.      A 3 mm polyp was found in the ascending colon. The polyp was sessile.       The polyp was removed with a jumbo cold forceps. Resection and retrieval       were complete.      A 10 mm polyp was found in the ascending colon. The polyp was sessile.       The polyp was removed with a cold snare. Resection and retrieval were       complete.      The exam was otherwise without abnormality on direct and retroflexion       views. Impression:            - Diverticulosis in the sigmoid colon.                        - One 3 mm polyp in the ascending colon, removed with                         a jumbo cold forceps. Resected and retrieved.                        - One 10 mm polyp in the ascending colon, removed with                         a cold snare. Resected and retrieved.                        - The examination was otherwise normal on direct and                         retroflexion views. Recommendation:        - Discharge patient to home (with escort).                        - Resume previous diet.                        - Continue present medications.                        - Repeat colonoscopy for surveillance and for                         surveillance based on pathology results. Procedure Code(s):     --- Professional ---  45385, Colonoscopy, flexible; with removal of                         tumor(s), polyp(s), or other lesion(s) by snare                         technique                        45380, 59, Colonoscopy, flexible; with biopsy, single                         or multiple Diagnosis Code(s):     --- Professional ---                        Z12.11, Encounter for screening for malignant neoplasm                         of colon                        K63.5, Polyp of colon                         K57.30, Diverticulosis of large intestine without                         perforation or abscess without bleeding CPT copyright 2019 American Medical Association. All rights reserved. The codes documented in this report are preliminary and upon coder review may  be revised to meet current compliance requirements. Jonathon Bellows, MD Jonathon Bellows MD, MD 12/14/2020 8:50:29 AM This report has been signed electronically. Number of Addenda: 0 Note Initiated On: 12/14/2020 8:12 AM Scope Withdrawal Time: 0 hours 26 minutes 5 seconds  Total Procedure Duration: 0 hours 29 minutes 55 seconds  Estimated Blood Loss:  Estimated blood loss: none.      [facility]

## 2020-12-15 ENCOUNTER — Encounter: Payer: Self-pay | Admitting: Gastroenterology

## 2020-12-15 LAB — SURGICAL PATHOLOGY

## 2020-12-19 NOTE — Anesthesia Postprocedure Evaluation (Signed)
Anesthesia Post Note  Patient: Luke Briggs.  Procedure(s) Performed: COLONOSCOPY WITH PROPOFOL (N/A )  Patient location during evaluation: Endoscopy Anesthesia Type: General Level of consciousness: awake and alert and oriented Pain management: pain level controlled Vital Signs Assessment: post-procedure vital signs reviewed and stable Respiratory status: spontaneous breathing Cardiovascular status: blood pressure returned to baseline Anesthetic complications: no   No complications documented.   Last Vitals:  Vitals:   12/14/20 0903 12/14/20 0913  BP: 129/81 (!) 155/90  Pulse: 61 68  Resp: 16 15  Temp:    SpO2: 98% 100%    Last Pain:  Vitals:   12/14/20 0913  TempSrc:   PainSc: 0-No pain                 Eilene Voigt

## 2020-12-24 ENCOUNTER — Encounter: Payer: Self-pay | Admitting: Gastroenterology

## 2020-12-25 ENCOUNTER — Telehealth: Payer: Self-pay | Admitting: Nurse Practitioner

## 2020-12-25 NOTE — Telephone Encounter (Signed)
Returned call to patient regarding utility assistance. Left voicemail letting patient know he will need to give the office a call in regards to needing assistance so that a referral can be placed and then a Care Guide will be able to assist him.     [employer], Care Management Phone: [phone] Email: [email or web address]

## 2020-12-28 ENCOUNTER — Ambulatory Visit (INDEPENDENT_AMBULATORY_CARE_PROVIDER_SITE_OTHER): Payer: Medicaid Other | Admitting: Vascular Surgery

## 2020-12-28 ENCOUNTER — Other Ambulatory Visit: Payer: Self-pay

## 2020-12-28 VITALS — BP 176/107 | HR 85 | Ht 70.0 in | Wt 189.0 lb

## 2020-12-28 DIAGNOSIS — I10 Essential (primary) hypertension: Secondary | ICD-10-CM

## 2020-12-28 DIAGNOSIS — R55 Syncope and collapse: Secondary | ICD-10-CM

## 2020-12-28 DIAGNOSIS — J432 Centrilobular emphysema: Secondary | ICD-10-CM

## 2020-12-28 DIAGNOSIS — I6523 Occlusion and stenosis of bilateral carotid arteries: Secondary | ICD-10-CM

## 2020-12-31 ENCOUNTER — Encounter (INDEPENDENT_AMBULATORY_CARE_PROVIDER_SITE_OTHER): Payer: Self-pay | Admitting: Vascular Surgery

## 2020-12-31 DIAGNOSIS — R55 Syncope and collapse: Secondary | ICD-10-CM | POA: Insufficient documentation

## 2020-12-31 DIAGNOSIS — I6529 Occlusion and stenosis of unspecified carotid artery: Secondary | ICD-10-CM | POA: Insufficient documentation

## 2020-12-31 NOTE — Progress Notes (Signed)
MRN : [id number]  Luke Briggs. is a 60 y.o. (02-19-1961) male who presents with chief complaint of  Chief Complaint  Patient presents with  . New Patient (Initial Visit)    Luke Briggs. Occlusion of Rt vertebral artery   .  History of Present Illness:   The patient is seen for evaluation of a syncopal episode. The patient describes it as a light headedness and denies the "room spinning"..  It lasted on the order of minutes and resolved completely.  There was no loss of consciousness.  There have been two or three prior episodes over the past several years.  There is no recent history of TIA symptoms or focal motor deficits. There is no prior documented CVA.  The patient was not taking enteric-coated aspirin 81 mg daily at the time.  There is no history of migraine headaches or prior diagnosis of ocular migraine. There is no history of seizures.  The patient has a history of coronary artery disease, no recent episodes of angina or shortness of breath. The patient denies PAD or claudication symptoms. There is a history of hyperlipidemia which is being treated with a statin.   CT angiogram of the neck is reviewed by me with the patientshows non-hemodynamically significant ICA stenosis and a dominant left vertebral.  Intracranial vessels were not imaged   Current Meds  Medication Sig  . albuterol (VENTOLIN HFA) 108 (90 Base) MCG/ACT inhaler Inhale 2 puffs into the lungs every 6 (six) hours as needed for wheezing orshortness of breath.  . EPINEPHrine 0.3 mg/0.3 mL IJ SOAJ injection Inject 0.3 mLs (0.3 mg total) into the muscle as needed for anaphylaxis.  . fentaNYL (DURAGESIC) 25 MCG/HR Place 1 patch onto the skin every other day.  . finasteride (PROSCAR) 5 MG tablet Take 1 tablet (5 mg total) by mouth daily.  . hydrocortisone (ANUSOL-HC) 2.5 % rectal cream Place 1 application rectally 2 (two) times daily. (Patient taking differently: Place 1 application rectally 2 (two) times  daily as needed.)  . Ipratropium-Albuterol (COMBIVENT) 20-100 MCG/ACT AERS respimat Inhale 1 puff into the lungs every 6 (six) hours.  . montelukast (SINGULAIR) 10 MG tablet Take 1 tablet (10 mg total) by mouth at bedtime.  [name] omeprazole (PRILOSEC) 40 MG capsule Take 1 capsule (40 mg total) by mouth daily.  . promethazine (PHENERGAN) 12.5 MG tablet Take 1 tablet (12.5 mg total) by mouth every 6 (six) hours as needed.  . sildenafil (VIAGRA) 50 MG tablet Take 1 tablet (50 mg total) by mouth daily as needed for erectile dysfunction.  [name] STIOLTO RESPIMAT 2.5-2.5 MCG/ACT AERS Inhale 2 puffs into the lungs daily.  . tamsulosin (FLOMAX) 0.4 MG CAPS capsule Take 1 capsule (0.4 mg total) by mouth daily.  [name] triamcinolone cream (KENALOG) 0.1 % Apply 1 application topically 2 (two) times daily.    Past Medical History:  Diagnosis Date  . Angioedema 08/07/2019  . Arthritis    knees,shoulders, ankles, most joints  . Cervical disc disorder    difficuly moving neck left  . COPD (chronic obstructive pulmonary disease) (HCC)    chronic cough and wheezing  . Dyspnea    easily  . GERD (gastroesophageal reflux disease)   . Hemorrhoids   . History of hiatal hernia   . Hypertension    controlled on meds  . Pneumonia 04/2019  . Prostate disorder   . PTSD (post-traumatic stress disorder)   . Schizophrenia ([place])    Per patient's report.    Past  Surgical History:  Procedure Laterality Date  . CATARACT EXTRACTION W/PHACO Left 03/28/2020   Procedure: CATARACT EXTRACTION PHACO AND INTRAOCULAR LENS PLACEMENT ([place]) LEFT MALYUGIN  MILOOP,   5.29  00:41.0;  Surgeon: Birder Robson, MD;  Location: [facility];  Service: Ophthalmology;  Laterality: Left;  . COLONOSCOPY WITH PROPOFOL N/A 12/14/2020   Procedure: COLONOSCOPY WITH PROPOFOL;  Surgeon: Jonathon Bellows, MD;  Location: [facility] ENDOSCOPY;  Service: Gastroenterology;  Laterality: N/A;  . EYE SURGERY Left    injury from a nail  . FOOT SURGERY Left      Social History Social History   Tobacco Use  . Smoking status: Current Every Day Smoker    Packs/day: 1.00    Years: 43.00    Pack years: 43.00    Types: Cigarettes  . Smokeless tobacco: Never Used  Vaping Use  . Vaping Use: Never used  Substance Use Topics  . Alcohol use: Yes    Alcohol/week: 8.0 standard drinks    Types: 8 Cans of beer per week    Comment: beer  . Drug use: Never    Family History Family History  Problem Relation Age of Onset  . Heart disease Mother   . Osteoporosis Mother   . Heart disease Father   . Emphysema Father   . Heart disease Sister   . Emphysema Maternal Grandmother   . Heart disease Maternal Grandfather   . Osteoporosis Sister   No family history of bleeding/clotting disorders, porphyria or autoimmune disease   Allergies  Allergen Reactions  . Lisinopril Swelling    Reaction: angioedema  . Amlodipine     Dizziness and edema with this  . Penicillins Rash    Reaction: Feels like skin is on fire Did it involve swelling of the face/tongue/throat, SOB, or low BP? No Did it involve sudden or severe rash/hives, skin peeling, or any reaction on the inside of your mouth or nose? No Did you need to seek medical attention at a hospital or doctor's office? Yes When did it last happen?30 years ago If all above answers are "NO", may proceed with cephalosporin use.      REVIEW OF SYSTEMS (Negative unless checked)  Constitutional: [] Weight loss  [] Fever  [] Chills Cardiac: [] Chest pain   [] Chest pressure   [] Palpitations   [] Shortness of breath when laying flat   [] Shortness of breath with exertion. Vascular:  [] Pain in legs with walking   [] Pain in legs at rest  [] History of DVT   [] Phlebitis   [] Swelling in legs   [] Varicose veins   [] Non-healing ulcers Pulmonary:   [] Uses home oxygen   [] Productive cough   [] Hemoptysis   [] Wheeze  [] COPD   [] Asthma Neurologic:  [x] Dizziness   [] Seizures   [] History of stroke   [] History of TIA   [] Aphasia   [] Vissual changes   [] Weakness or numbness in arm   [x] Weakness or numbness in leg Musculoskeletal:   [] Joint swelling   [] Joint pain   [] Low back pain Hematologic:  [] Easy bruising  [] Easy bleeding   [] Hypercoagulable state   [] Anemic Gastrointestinal:  [] Diarrhea   [] Vomiting  [] Gastroesophageal reflux/heartburn   [] Difficulty swallowing. Genitourinary:  [] Chronic kidney disease   [] Difficult urination  [] Frequent urination   [] Blood in urine Skin:  [] Rashes   [] Ulcers  Psychological:  [] History of anxiety   []  History of major depression.  Physical Examination  Vitals:   12/28/20 1004  BP: (!) 176/107  Pulse: 85  Weight: 189 lb (85.7 kg)  Height:  5\' 10"  (1.778 m)   Body mass index is 27.12 kg/m. Gen: WD/WN, NAD Head: [place]/AT, No temporalis wasting.  Ear/Nose/Throat: Hearing grossly intact, nares w/o erythema or drainage, poor dentition Eyes: PER, EOMI, sclera nonicteric.  Neck: Supple, no masses.  No bruit or JVD.  Pulmonary:  Good air movement, clear to auscultation bilaterally, no use of accessory muscles.  Cardiac: RRR, normal S1, S2, no Murmurs. Vascular: no carotid bruit Vessel Right Left  Radial Palpable Palpable  Carotid Palpable Palpable  Gastrointestinal: soft, non-distended. No guarding/no peritoneal signs.  Musculoskeletal: M/S 5/5 throughout.  No deformity or atrophy.  Neurologic: CN 2-12 intact. Pain and light touch intact in extremities.  Symmetrical.  Speech is fluent. Motor exam as listed above. Psychiatric: Judgment intact, Mood & affect appropriate for pt's clinical situation. Dermatologic: No rashes or ulcers noted.  No changes consistent with cellulitis.  CBC Lab Results  Component Value Date   WBC 12.6 (H) 10/30/2020   HGB 18.0 (H) 10/30/2020   HCT 53.5 (H) 10/30/2020   MCV 93.4 10/30/2020   PLT 343 10/30/2020    BMET    Component Value Date/Time   NA 140 10/30/2020 1604   NA 140 08/28/2020 0930   K 4.1 10/30/2020 1604   CL 100  10/30/2020 1604   CO2 26 10/30/2020 1604   GLUCOSE 114 (H) 10/30/2020 1604   BUN 13 10/30/2020 1604   BUN 10 08/28/2020 0930   CREATININE 1.26 (H) 10/30/2020 1604   CALCIUM 9.6 10/30/2020 1604   GFRNONAA >60 10/30/2020 1604   GFRAA 97 08/28/2020 0930   CrCl cannot be calculated (Patient's most recent lab result is older than the maximum 21 days allowed.).  COAG No results found for: INR, PROTIME  Radiology No results found.  Assessment/Plan 1. Bilateral carotid artery stenosis The patient remains asymptomatic with respect to the carotid stenosis.  However, the patient has now progressed and has a lesion the is >70%.  Patient should undergo CT angiography of the intracranial arteries to define the degree of stenosis of the internal carotid arteries bilaterally.  If the patient does indeed need surgery cardiac clearance will be required, once cleared the patient will be scheduled for surgery.  The risks, benefits and alternative therapies were reviewed in detail with the patient.  All questions were answered.  The patient agrees to proceed with imaging.  Continue antiplatelet therapy as prescribed. Continue management of CAD, HTN and Hyperlipidemia. Healthy heart diet, encouraged exercise at least 4 times per week.   - CT ANGIO HEAD W OR WO CONTRAST; Future  2. Syncope, unspecified syncope type The patient remains asymptomatic with respect to the carotid stenosis.  However, the patient has now progressed and has a lesion the is >70%.  Patient should undergo CT angiography of the intracranial arteries to define the degree of stenosis of the internal carotid arteries bilaterally.  If the patient does indeed need surgery cardiac clearance will be required, once cleared the patient will be scheduled for surgery.  The risks, benefits and alternative therapies were reviewed in detail with the patient.  All questions were answered.  The patient agrees to proceed with  imaging.  Continue antiplatelet therapy as prescribed. Continue management of CAD, HTN and Hyperlipidemia. Healthy heart diet, encouraged exercise at least 4 times per week.   - CT ANGIO HEAD W OR WO CONTRAST; Future  3. Essential hypertension Continue antihypertensive medications as already ordered, these medications have been reviewed and there are no changes at this time.  4. Centrilobular emphysema ([place]) Continue pulmonary medications and aerosols as already ordered, these medications have been reviewed and there are no changes at this time.      Hortencia Pilar, MD  12/31/2020 3:57 PM

## 2021-01-04 ENCOUNTER — Telehealth: Payer: Self-pay | Admitting: Cardiology

## 2021-01-04 MED ORDER — DILTIAZEM HCL ER COATED BEADS 120 MG PO CP24: 120.0000 mg | ORAL_CAPSULE | Freq: Two times a day (BID) | ORAL | 0 refills | Status: DC

## 2021-01-04 NOTE — Telephone Encounter (Signed)
Called patient and relayed the below the copied and pasted recommendation from Dr. Garen Lah. Patient verbalized understanding and confirmed that he will be here next week for his follow up appointment.  Okay to increase/prescribed Cardizem 120 mg twice daily. Just do a 60-day/1 month supply, since we may consider prescribing a 240 mg tablet at follow-up visit.

## 2021-01-04 NOTE — Telephone Encounter (Signed)
*  STAT* If patient is at the pharmacy, call can be transferred to refill team.   1. Which medications need to be refilled? (please list name of each medication and dose if known) diltiazem 120 MG - states he has been taking 2 tablets daily to help with BP  2. Which pharmacy/location (including street and city if local pharmacy) is medication to be sent to? [facility]   3. Do they need a 30 day or 90 day supply? 90 day   Patient scheduled an appt for 01/11/21 - still has not completed stress test

## 2021-01-04 NOTE — Telephone Encounter (Signed)
Please review for refill. Patient increased medication on his own.

## 2021-01-04 NOTE — Telephone Encounter (Signed)
Spoke with patient and he stated that he has been taking his Diltiazem 120 MG once in the morning and once in the evening for about a month now. He stated the last BP that he took a couple days ago was 182/113. He started taking it twice a day because it made him feel better.  Will send to Dr. Garen Lah for further recommendation.

## 2021-01-05 ENCOUNTER — Other Ambulatory Visit: Payer: Self-pay | Admitting: Nurse Practitioner

## 2021-01-09 ENCOUNTER — Ambulatory Visit: Payer: Self-pay | Admitting: Nurse Practitioner

## 2021-01-09 NOTE — Telephone Encounter (Signed)
Please call and alert him I also recommend urgent assessment today due to his symptoms -- recommend UC or ER today.

## 2021-01-09 NOTE — Telephone Encounter (Signed)
Pt stated there is no way he would be able to go to ED or Urgent care today at all. Pt verbalized understanding of providers recommendations.

## 2021-01-09 NOTE — Telephone Encounter (Signed)
Pt reports 10/10 pain, lower back, radiates to left side, down groin. Onset 3 days ago, worsening. Pain is constant. States "Have to push really hard to pee and I'm peeing all the time." Also reports pain/pressure at lower abdomen, bladder area. States testicles "Look swollen and purplish, warm."  Pt sounds distressed. Advised ED. Agent had made appt for tomorrow prior to triage. Pt states "I'll stick it out another day."  Reiterated need for ED eval now. States "I can't drive and I don't have a ride." Offered EMS, cab, offered to call EMS for him, declines all. Discussed potential seriousness  of symptoms, continues to decline. NT called practice, Collene Mares, to alert of situation, will route to practice for review.   Reason for Disposition  [1] SEVERE pain (e.g., excruciating, scale 8-10) AND [2] present > 1 hour  Answer Assessment - Initial Assessment Questions 1. LOCATION: "Where does it hurt?" (e.g., left, right)    Lower back and towards left side, down groin area, testicles 2. ONSET: "When did the pain start?"     3 days, worsening 3. SEVERITY: "How bad is the pain?" (e.g., Scale 1-10; mild, moderate, or severe)   - MILD (1-3): doesn't interfere with normal activities    - MODERATE (4-7): interferes with normal activities or awakens from sleep    - SEVERE (8-10): excruciating pain and patient unable to do normal activities (stays in bed)       10/10 4. PATTERN: "Does the pain come and go, or is it constant?"     constant 5. CAUSE: "What do you think is causing the pain?"      6. OTHER SYMPTOMS:  "Do you have any other symptoms?" (e.g., fever, abdominal pain, vomiting, leg weakness, burning with urination, blood in urine)     Can't void, frequency, lower abdominal pain at bladder area  Protocols used: FLANK PAIN-A-AH

## 2021-01-10 ENCOUNTER — Ambulatory Visit (INDEPENDENT_AMBULATORY_CARE_PROVIDER_SITE_OTHER): Payer: Medicaid Other | Admitting: Nurse Practitioner

## 2021-01-10 ENCOUNTER — Other Ambulatory Visit: Payer: Self-pay

## 2021-01-10 ENCOUNTER — Encounter: Payer: Self-pay | Admitting: Nurse Practitioner

## 2021-01-10 VITALS — BP 142/98 | HR 78 | Temp 98.3°F | Wt 189.2 lb

## 2021-01-10 DIAGNOSIS — F1123 Opioid dependence with withdrawal: Secondary | ICD-10-CM | POA: Insufficient documentation

## 2021-01-10 DIAGNOSIS — I10 Essential (primary) hypertension: Secondary | ICD-10-CM

## 2021-01-10 DIAGNOSIS — J438 Other emphysema: Secondary | ICD-10-CM

## 2021-01-10 DIAGNOSIS — I700 Atherosclerosis of aorta: Secondary | ICD-10-CM | POA: Diagnosis not present

## 2021-01-10 DIAGNOSIS — R399 Unspecified symptoms and signs involving the genitourinary system: Secondary | ICD-10-CM | POA: Insufficient documentation

## 2021-01-10 DIAGNOSIS — N410 Acute prostatitis: Secondary | ICD-10-CM | POA: Insufficient documentation

## 2021-01-10 DIAGNOSIS — F1111 Opioid abuse, in remission: Secondary | ICD-10-CM | POA: Insufficient documentation

## 2021-01-10 DIAGNOSIS — F17219 Nicotine dependence, cigarettes, with unspecified nicotine-induced disorders: Secondary | ICD-10-CM

## 2021-01-10 DIAGNOSIS — F1193 Opioid use, unspecified with withdrawal: Secondary | ICD-10-CM

## 2021-01-10 DIAGNOSIS — F10288 Alcohol dependence with other alcohol-induced disorder: Secondary | ICD-10-CM

## 2021-01-10 DIAGNOSIS — G894 Chronic pain syndrome: Secondary | ICD-10-CM

## 2021-01-10 LAB — URINALYSIS, ROUTINE W REFLEX MICROSCOPIC
Bilirubin, UA: NEGATIVE
Glucose, UA: NEGATIVE
Ketones, UA: NEGATIVE
Leukocytes,UA: NEGATIVE
Nitrite, UA: NEGATIVE
Protein,UA: NEGATIVE
RBC, UA: NEGATIVE
Specific Gravity, UA: 1.015 (ref 1.005–1.030)
Urobilinogen, Ur: 0.2 mg/dL (ref 0.2–1.0)
pH, UA: 5.5 (ref 5.0–7.5)

## 2021-01-10 MED ORDER — CLONIDINE HCL 0.2 MG PO TABS: 0.2000 mg | ORAL_TABLET | Freq: Two times a day (BID) | ORAL | 3 refills | Status: DC

## 2021-01-10 MED ORDER — SULFAMETHOXAZOLE-TRIMETHOPRIM 800-160 MG PO TABS: 1.0000 | ORAL_TABLET | Freq: Two times a day (BID) | ORAL | 0 refills | Status: AC

## 2021-01-10 NOTE — Progress Notes (Signed)
BP (!) 142/98 (BP Location: Left Arm)   Pulse 78   Temp 98.3 F (36.8 C) (Oral)   Wt 189 lb 3.2 oz (85.8 kg)   SpO2 95%   BMI 27.15 kg/m    Subjective:    Patient ID: Luke Briggs., male    DOB: [birth year], 60 y.o.   MRN: [id number]  HPI: Luke Briggs. is a 60 y.o. male  Chief Complaint  Patient presents with  . Abdominal Pain  . Flank Pain    Pt states he first started to notice symptoms 4 days ago, pt complains of twitching, dizzy spells, weakness and vomiting, testicular, back and kidney pain. Pt states he recently stop his opoid medication and wonders if that is why is having these symptoms.   ABDOMINAL PAIN  Presents for abdominal and flank pain today.  He wonders if it is related to the fact he quit cold [place] taking his Suboxone 4 days ago, was being followed by pain clinic for chronic pain and last seen one month ago per report.  He states he would rather be in pain then feel like he did and he is not returning.  When has intercourse he does not ejaculate he reports and last time he did was green in color he reports. Duration:days Onset: gradual Severity: moderate Quality: dull and aching Location:  diffuse  Episode duration:  Radiation: no Frequency: intermittent Alleviating factors: unknown Aggravating factors: unknown Status: fluctuating Treatments attempted: none Fever: recently with withdrawal symptoms Nausea: yes Vomiting: yes Weight loss: no Decreased appetite: yes Diarrhea: yes Constipation: no Blood in stool: no Heartburn: no Jaundice: no Rash: no Dysuria/urinary frequency: no Hematuria: no History of sexually transmitted disease: no Recurrent NSAID use: no   COPD Uses Combivent, Stiolto, and Albuterol as needed.  Had last lung screening on 10/10/20 -- noting emphysema and aortic atherosclerosis. Satisfied with current treatment?: yes Oxygen use: no Dyspnea frequency: minimal Cough frequency: none Rescue inhaler frequency:    Limitation of activity: no Productive cough: none Last Spirometry: unknown Pneumovax: Not up to Date Influenza: Up to Date  HYPERTENSION Saw cardiology on 10/72021.  Continues onCardizem 120 MG BID. He reports up until 4 days ago was a case of beer a day for 6 months.  Continues to smoke 1/2 to 1 PPD.Not interested in quitting.  Had echo performed 11/18/19 -- EF 60-65% andno left ventricular hypertrophy.  Returns to see cardiology tomorrow.   Hypertension status:stable Satisfied with current treatment?yes Duration of hypertension:chronic BP monitoring frequency: not checking BP range:  BP medication side effects:no Medication compliance:good compliance Previous BP meds:has tried multiple medications and had ADR Aspirin:no Recurrent headaches:no Visual changes:no Palpitations:no Dyspnea:no Chest pain:no Lower extremity edema:no Dizzy/lightheaded:no  Relevant past medical, surgical, family and social history reviewed and updated as indicated. Interim medical history since our last visit reviewed. Allergies and medications reviewed and updated.  Review of Systems  Constitutional: Positive for appetite change and chills (occasional with withdrawals). Negative for activity change, diaphoresis, fatigue and fever.  Respiratory: Negative for cough, chest tightness, shortness of breath and wheezing.   Cardiovascular: Negative for chest pain, palpitations and leg swelling.  Gastrointestinal: Positive for abdominal pain and nausea. Negative for abdominal distention, blood in stool, constipation, diarrhea and vomiting.  Endocrine: Negative for cold intolerance, heat intolerance, polydipsia, polyphagia and polyuria.  Genitourinary: Positive for flank pain and testicular pain. Negative for decreased urine volume, dysuria, frequency, hematuria, penile discharge, penile pain, penile swelling, scrotal swelling and urgency.  Neurological: Positive for dizziness and  weakness. Negative for syncope, light-headedness, numbness and headaches.  Psychiatric/Behavioral: Negative.     Per HPI unless specifically indicated above     Objective:    BP (!) 142/98 (BP Location: Left Arm)   Pulse 78   Temp 98.3 F (36.8 C) (Oral)   Wt 189 lb 3.2 oz (85.8 kg)   SpO2 95%   BMI 27.15 kg/m   Wt Readings from Last 3 Encounters:  01/10/21 189 lb 3.2 oz (85.8 kg)  12/28/20 189 lb (85.7 kg)  12/14/20 185 lb (83.9 kg)    Physical Exam Vitals and nursing note reviewed.  Constitutional:      General: He is awake. He is not in acute distress.    Appearance: He is well-developed and well-groomed. He is not ill-appearing, toxic-appearing or diaphoretic.  HENT:     Head: Normocephalic and atraumatic.     Right Ear: Hearing normal. No drainage.     Left Ear: Hearing normal. No drainage.  Eyes:     General: Lids are normal.        Right eye: No discharge.        Left eye: No discharge.     Conjunctiva/sclera: Conjunctivae normal.     Pupils: Pupils are equal, round, and reactive to light.  Neck:     Thyroid: No thyromegaly.     Vascular: No carotid bruit.     Trachea: Trachea normal.  Cardiovascular:     Rate and Rhythm: Normal rate and regular rhythm.     Heart sounds: Normal heart sounds, S1 normal and S2 normal. No murmur heard. No gallop.   Pulmonary:     Effort: Pulmonary effort is normal. No accessory muscle usage or respiratory distress.     Breath sounds: Normal breath sounds.  Abdominal:     General: Bowel sounds are normal. There is no distension.     Palpations: Abdomen is soft. There is no hepatomegaly.     Tenderness: There is generalized abdominal tenderness. There is no right CVA tenderness, left CVA tenderness, guarding or rebound. Negative signs include Murphy's sign.     Hernia: No hernia is present. There is no hernia in the left inguinal area or right inguinal area.  Genitourinary:    Penis: Circumcised.      Testes: Normal.      Epididymis:     Right: Normal.     Left: Normal.     Prostate: Enlarged and tender. No nodules present.     Comments: He declined chaperone.  Prostate exam noted enlargement and tenderness to palpation. Musculoskeletal:        General: Normal range of motion.     Cervical back: Normal range of motion and neck supple.     Right lower leg: No edema.     Left lower leg: No edema.  Skin:    General: Skin is warm and dry.     Capillary Refill: Capillary refill takes less than 2 seconds.     Findings: No rash.  Neurological:     Mental Status: He is alert and oriented to person, place, and time.     Cranial Nerves: Cranial nerves are intact.     Coordination: Coordination is intact.     Gait: Gait is intact.     Deep Tendon Reflexes: Reflexes are normal and symmetric.     Reflex Scores:      Brachioradialis reflexes are 2+ on the right side and 2+ on the  left side.      Patellar reflexes are 2+ on the right side and 2+ on the left side. Psychiatric:        Attention and Perception: Attention normal.        Mood and Affect: Mood normal.        Speech: Speech normal.        Behavior: Behavior normal. Behavior is cooperative.        Thought Content: Thought content normal.     Results for orders placed or performed during the hospital encounter of 12/14/20  Surgical pathology  Result Value Ref Range   SURGICAL PATHOLOGY      SURGICAL PATHOLOGY CASE: [id number] PATIENT: Dominica Severin Surgical Pathology Report     Specimen Submitted: A. Colon polyp x2, ascending; cbx(1) cs(1)  Clinical History: Screening colonoscopy, Colon polyps      DIAGNOSIS: A. COLON POLYP X2, ASCENDING; COLD BIOPSY (1) AND COLD SNARE (1): - TUBULAR ADENOMA (2). - NEGATIVE FOR HIGH-GRADE DYSPLASIA.  GROSS DESCRIPTION: A. Labeled: Ascending colon polyp cbx x1, polyp cold snare x1 Received: Formalin Collection time: 8:25 AM on 12/14/2020 Placed into formalin time: 8:25 AM on 12/14/2020 Tissue  fragment(s): Multiple Size: Aggregate, 1.5 x 0.4 x 0.2 cm Description: Tan soft tissue fragments Entirely submitted in 1 cassette.  Final Diagnosis performed by Quay Burow, MD.   Electronically signed 12/15/2020 2:20:37PM The electronic signature indicates that the named Attending Pathologist has evaluated the specimen Technical component performed at [facility], [address] [place], [place] [address] Lab: [phone] Dir: Rush Farmer, MD, MMM  Professional component performed at [facility], [facility], [place], [place], [place] [address] Lab: [phone] Dir: Dellia Nims. Reuel Derby, MD       Assessment & Plan:   Problem List Items Addressed This Visit      Cardiovascular and Mediastinum   Essential hypertension    Chronic, ongoing.   Continue current medication regimen and recommend cutting back on alcohol and smoking.  Continue collaboration with cardiology. CMP today.  Return in 3 months for follow-up in office.      Relevant Medications   cloNIDine (CATAPRES) 0.2 MG tablet   Other Relevant Orders   TSH   Lipid Panel w/o Chol/HDL Ratio   Aortic atherosclerosis ([place])    This was noticed on November 2020 lung CT.  No current statin or ASA (refuses).  Will continue to recommend initiation of statin and ASA + complete cessation of smoking.  Lipid panel today.      Relevant Medications   cloNIDine (CATAPRES) 0.2 MG tablet     Respiratory   Paraseptal emphysema (HCC) - Primary    Chronic, ongoing.  Continue current inhaler regimen at this time and recommend complete cessation of smoking.  Continue yearly lung screening.        Nervous and Auditory   Nicotine dependence, cigarettes, w unsp disorders    I have recommended complete cessation of tobacco use. I have discussed various options available for assistance with tobacco cessation including over the counter methods (Nicotine gum, patch and lozenges). We also discussed prescription options (Chantix,  Nicotine Inhaler / Nasal Spray). The patient is not interested in pursuing any prescription tobacco cessation options at this time.       Opioid withdrawal (HCC)    Currently going through withdrawal symptoms due to cold [place] quitting Suboxone 4 days ago. Had at length discussion with patient and recommended ER visit, which he blatantly refuses.  Recommend he reach out  to pain management to alert them of this.  At this time will obtain CMP, CBC, TSH.  Start Clonidine 0.2 MG BID, which may benefit BP and withdrawal symptoms -- he has taken this in past and agrees to try again.  He refuses rehab program.  Plan for return in 4 weeks, sooner if any worsening symptoms present.  He is aware to immediately seek ER assistance if worsening.        Genitourinary   Acute prostatitis    Noted on exam and suspect reason for abdominal discomfort and testicular pain.  Will obtain urine GC/Chlam testing.  UA negative.  CBC and CMP today.  Start Bactrim DS one tablet BID for total of 4 weeks.  Will plan to recheck at that time and obtain PSA level if improved.  Recommend if worsening symptoms immediately alert provider or go to ER.  His recent K+ level was in normal range.      Relevant Orders   Comprehensive metabolic panel   CBC with Differential/Platelet   GC/Chlamydia Probe Amp     Other   Chronic pain syndrome    Chronic, ongoing.  Being followed by pain management.  Currently going through withdrawal symptoms due to cold [place] quitting Suboxone 4 days ago.  Had at length discussion with patient and recommended ER visit, which he refuses.  Recommend he reach out to pain management to alert them of this.  At this time will obtain CMP, CBC, TSH.  Start Clonidine 0.2 MG BID, which may benefit BP and withdrawal symptoms -- he has taken this in past and agrees to try again.      Alcohol dependence ([place])    Recommend cutting back.  He refuses rehab or therapy.  Is aware of effect of heavy alcohol use on  overall health, especially BP.      Urinary symptom or sign    UA negative, suspect acute prostatitis -- refer to this plan for further.      Relevant Orders   Urinalysis, Routine w reflex microscopic       Follow up plan: Return in about 4 weeks (around 02/07/2021) for Prostatis and pain.

## 2021-01-10 NOTE — Assessment & Plan Note (Signed)
I have recommended complete cessation of tobacco use. I have discussed various options available for assistance with tobacco cessation including over the counter methods (Nicotine gum, patch and lozenges). We also discussed prescription options (Chantix, Nicotine Inhaler / Nasal Spray). The patient is not interested in pursuing any prescription tobacco cessation options at this time.  

## 2021-01-10 NOTE — Assessment & Plan Note (Signed)
Chronic, ongoing.   Continue current medication regimen and recommend cutting back on alcohol and smoking.  Continue collaboration with cardiology. CMP today.  Return in 3 months for follow-up in office.

## 2021-01-10 NOTE — Assessment & Plan Note (Signed)
Recommend cutting back.  He refuses rehab or therapy.  Is aware of effect of heavy alcohol use on overall health, especially BP. 

## 2021-01-10 NOTE — Assessment & Plan Note (Addendum)
Currently going through withdrawal symptoms due to cold [place] quitting Suboxone 4 days ago. Had at length discussion with patient and recommended ER visit, which he blatantly refuses.  Recommend he reach out to pain management to alert them of this.  At this time will obtain CMP, CBC, TSH.  Start Clonidine 0.2 MG BID, which may benefit BP and withdrawal symptoms -- he has taken this in past and agrees to try again.  He refuses rehab program.  Plan for return in 4 weeks, sooner if any worsening symptoms present.  He is aware to immediately seek ER assistance if worsening.

## 2021-01-10 NOTE — Assessment & Plan Note (Signed)
Chronic, ongoing.  Being followed by pain management.  Currently going through withdrawal symptoms due to cold [place] quitting Suboxone 4 days ago.  Had at length discussion with patient and recommended ER visit, which he refuses.  Recommend he reach out to pain management to alert them of this.  At this time will obtain CMP, CBC, TSH.  Start Clonidine 0.2 MG BID, which may benefit BP and withdrawal symptoms -- he has taken this in past and agrees to try again.

## 2021-01-10 NOTE — Assessment & Plan Note (Signed)
Noted on exam and suspect reason for abdominal discomfort and testicular pain.  Will obtain urine GC/Chlam testing.  UA negative.  CBC and CMP today.  Start Bactrim DS one tablet BID for total of 4 weeks.  Will plan to recheck at that time and obtain PSA level if improved.  Recommend if worsening symptoms immediately alert provider or go to ER.  His recent K+ level was in normal range.

## 2021-01-10 NOTE — Assessment & Plan Note (Signed)
This was noticed on November 2020 lung CT.  No current statin or ASA (refuses).  Will continue to recommend initiation of statin and ASA + complete cessation of smoking.  Lipid panel today.

## 2021-01-10 NOTE — Assessment & Plan Note (Signed)
UA negative, suspect acute prostatitis -- refer to this plan for further.

## 2021-01-10 NOTE — Patient Instructions (Signed)
Prostatitis  Prostatitis is swelling of the prostate gland, also called the prostate. This gland is about 1.5 inches wide and 1 inch high, and it helps to make a fluid called semen. The prostate is below a man's bladder, in front of the butt (rectum). There are different types of prostatitis. What are the causes? One type of prostatitis is caused by an infection from germs (bacteria). Another type is not caused by germs. It may be caused by:  Things having to do with the nervous system. This system includes thebrain, spinal cord, and nerves.  An autoimmune response. This happens when the body's disease-fighting system attacks healthy tissue in the body by mistake.  Psychological factors. These have to do with how the mind works. The causes of other types of prostatitis are normally not known. What are the signs or symptoms? Symptoms of this condition depend on the type of prostatitis you have. If your condition is caused by germs:  You may feel pain or burning when you pee (urinate).  You may pee often and all of a sudden.  You may have problems starting to pee.  You may have trouble emptying your bladder when you pee.  You may have fever or chills.  You may feel pain in your muscles, joints, low back, or lower belly. If you have other types of prostatitis:  You may pee often or all of a sudden.  You may have trouble starting to pee.  You may have a weak flow when you pee.  You may leak pee after using the bathroom.  You may have other problems, such as: ? Abnormal fluid coming from the penis. ? Pain in the testicles or penis. ? Pain between the butt and the testicles. ? Pain when fluid comes out of the penis during sex. How is this treated? Treatment for this condition depends on the type of prostatitis. Treatment may include:  Medicines. These may treat pain or swelling, or they may help relax muscles.  Exercises to help you move better or get stronger (physical  therapy).  Heat therapy.  Techniques to help you control some of the ways that your body works.  Exercises to help you relax.  Antibiotic medicine, if your condition is caused by germs.  Warm water baths (sitz baths) to relax muscles. Follow these instructions at home: Medicines  Take over-the-counter and prescription medicines only as told by your doctor.  If you were prescribed an antibiotic medicine, take it as told by your doctor. Do not stop using the antibiotic even if you start to feel better. Managing pain and swelling  Take sitz baths as told by your doctor. For a sitz bath, sit in warm water that is deep enough to cover your hips and butt.  If told, put heat on the painful area. Do this as often as told by your doctor. Use the heat source that your doctor recommends, such as a moist heat pack or a heating pad. ? Place a towel between your skin and the heat source. ? Leave the heat on for 20-30 minutes. ? Take off the heat if your skin turns bright red. This is very important if you are unable to feel pain, heat, or cold. You may have a greater risk of getting burned.   General instructions  Do exercises as told by your doctor, if your doctor prescribed them.  Keep all follow-up visits as told by your doctor. This is important. Where to find more information  [employer]  of Diabetes and Digestive and Kidney Diseases: http://www.bass.com/ Contact a doctor if:  Your symptoms get worse.  You have a fever. Get help right away if:  You have chills.  You feel light-headed.  You feel like you may faint.  You cannot pee.  You have blood or clumps of blood (blood clots) in your pee. Summary  Prostatitis is swelling of the prostate gland.  There are different types of prostatitis. Treatment depends on the type that you have.  Take over-the-counter and prescription medicines only as told by your doctor.  Get help right away of you have chills, feel  light-headed, or feel like you may faint. Also get help right away if you cannot pee or you have blood or clumps of blood in your pee. This information is not intended to replace advice given to you by your health care provider. Make sure you discuss any questions you have with your health care provider. Document Revised: 12/10/2019 Document Reviewed: 12/10/2019 Elsevier Patient Education  2021 [employer].

## 2021-01-10 NOTE — Assessment & Plan Note (Signed)
Chronic, ongoing.  Continue current inhaler regimen at this time and recommend complete cessation of smoking.  Continue yearly lung screening. 

## 2021-01-11 ENCOUNTER — Other Ambulatory Visit: Payer: Self-pay | Admitting: Nurse Practitioner

## 2021-01-11 ENCOUNTER — Ambulatory Visit (INDEPENDENT_AMBULATORY_CARE_PROVIDER_SITE_OTHER): Payer: Medicaid Other | Admitting: Cardiology

## 2021-01-11 ENCOUNTER — Encounter: Payer: Self-pay | Admitting: Cardiology

## 2021-01-11 VITALS — BP 158/90 | HR 76 | Ht 70.0 in | Wt 190.1 lb

## 2021-01-11 DIAGNOSIS — R079 Chest pain, unspecified: Secondary | ICD-10-CM | POA: Diagnosis not present

## 2021-01-11 DIAGNOSIS — I10 Essential (primary) hypertension: Secondary | ICD-10-CM | POA: Diagnosis not present

## 2021-01-11 DIAGNOSIS — F17200 Nicotine dependence, unspecified, uncomplicated: Secondary | ICD-10-CM

## 2021-01-11 LAB — COMPREHENSIVE METABOLIC PANEL
ALT: 18 IU/L (ref 0–44)
AST: 23 IU/L (ref 0–40)
Albumin/Globulin Ratio: 1.6 (ref 1.2–2.2)
Albumin: 4.5 g/dL (ref 3.8–4.9)
Alkaline Phosphatase: 90 IU/L (ref 44–121)
BUN/Creatinine Ratio: 10 (ref 9–20)
BUN: 8 mg/dL (ref 6–24)
Bilirubin Total: 0.7 mg/dL (ref 0.0–1.2)
CO2: 21 mmol/L (ref 20–29)
Calcium: 9.5 mg/dL (ref 8.7–10.2)
Chloride: 100 mmol/L (ref 96–106)
Creatinine, Ser: 0.83 mg/dL (ref 0.76–1.27)
GFR calc Af Amer: 113 mL/min/{1.73_m2} (ref >59)
GFR calc non Af Amer: 98 mL/min/{1.73_m2} (ref >59)
Globulin, Total: 2.9 g/dL (ref 1.5–4.5)
Glucose: 82 mg/dL (ref 65–99)
Potassium: 4 mmol/L (ref 3.5–5.2)
Sodium: 137 mmol/L (ref 134–144)
Total Protein: 7.4 g/dL (ref 6.0–8.5)

## 2021-01-11 LAB — CBC WITH DIFFERENTIAL/PLATELET
Basophils Absolute: 0 10*3/uL (ref 0.0–0.2)
Basos: 0 %
EOS (ABSOLUTE): 0 10*3/uL (ref 0.0–0.4)
Eos: 0 %
Hematocrit: 48.7 % (ref 37.5–51.0)
Hemoglobin: 16.8 g/dL (ref 13.0–17.7)
Immature Grans (Abs): 0 10*3/uL (ref 0.0–0.1)
Immature Granulocytes: 0 %
Lymphocytes Absolute: 2.3 10*3/uL (ref 0.7–3.1)
Lymphs: 24 %
MCH: 31.5 pg (ref 26.6–33.0)
MCHC: 34.5 g/dL (ref 31.5–35.7)
MCV: 91 fL (ref 79–97)
Monocytes Absolute: 0.8 10*3/uL (ref 0.1–0.9)
Monocytes: 8 %
Neutrophils Absolute: 6.7 10*3/uL (ref 1.4–7.0)
Neutrophils: 68 %
Platelets: 292 10*3/uL (ref 150–450)
RBC: 5.34 x10E6/uL (ref 4.14–5.80)
RDW: 12.1 % (ref 11.6–15.4)
WBC: 9.9 10*3/uL (ref 3.4–10.8)

## 2021-01-11 LAB — LIPID PANEL W/O CHOL/HDL RATIO
Cholesterol, Total: 214 mg/dL — ABNORMAL HIGH (ref 100–199)
HDL: 55 mg/dL (ref >39)
LDL Chol Calc (NIH): 135 mg/dL — ABNORMAL HIGH (ref 0–99)
Triglycerides: 134 mg/dL (ref 0–149)
VLDL Cholesterol Cal: 24 mg/dL (ref 5–40)

## 2021-01-11 LAB — TSH: TSH: 1.5 u[IU]/mL (ref 0.450–4.500)

## 2021-01-11 MED ORDER — ATORVASTATIN CALCIUM 10 MG PO TABS: 10.0000 mg | ORAL_TABLET | Freq: Every day | ORAL | 3 refills | Status: DC

## 2021-01-11 MED ORDER — DILTIAZEM HCL ER COATED BEADS 120 MG PO CP24: 120.0000 mg | ORAL_CAPSULE | Freq: Two times a day (BID) | ORAL | 3 refills | Status: DC

## 2021-01-11 MED ORDER — CLONIDINE HCL 0.2 MG PO TABS: 0.2000 mg | ORAL_TABLET | Freq: Two times a day (BID) | ORAL | 3 refills | Status: DC

## 2021-01-11 NOTE — Progress Notes (Signed)
Cardiology Office Note:    Date:  01/11/2021   ID:  Luke Pollen., DOB [birth year], MRN [id number]  PCP:  Luke Lick, NP  Cardiologist:  Luke Sable, MD  Electrophysiologist:  None   Referring MD: Luke Lick, NP   Chief Complaint  Patient presents with  . Other    C/o elevated BP and massive headaches. Pt hasn't completed stress test. Pt needs refills. Meds reviewed verbally with pt.    History of Present Illness:    Luke Fleet. is a 60 y.o. 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    Patient being seen for hypertension and medication management. Management of hypertension has been challenging due to several adverse effects from antihypertensives as documented below. His BP was elevated with systolic in the 595G, seems to be tolerating diltiazem.  Cardizem was increased to 120 mg twice daily.    He also noted some ringing in his ears, primary care started him on clonidine to help with sleep.  He seems to have been tolerating this of late even though he complains of side effects previously.     Lexiscan Myoview was ordered due to chest discomfort which he still has, but patient did not perform due to being very busy.  He still has symptoms of chest discomfort.  Prior echo showed normal systolic and diastolic function.   Prior notes Patient has  poorly controlled blood pressures for years.  He has tried several medications but stopped due to adverse effects.  Lisinopril caused angioedema, carvedilol caused nausea/vomiting, amlodipine caused severe dizziness.    Patient has also tried chlorthalidone but was found to have hyponatremia and hypokalemia.  At the time, he was seen in emergency room for vomiting.  He was started on hydralazine but later on stopped because hydralazine causing shortness of breath, pounding in his chest. Clonidine  cause ringing in his ears. Coreg also cause ringing in his ears.  He has been drinking heavily for the past 18 months after losing his fiance.  He drinks 10-15 beers daily.  He states he has been trying to cut back.  He also is a current smoker for over 40 years.    Echocardiogram 10/2019 showed normal systolic and diastolic function.     Past Medical History:  Diagnosis Date  . Angioedema 08/07/2019  . Arthritis    knees,shoulders, ankles, most joints  . Cervical disc disorder    difficuly moving neck left  . COPD (chronic obstructive pulmonary disease) (HCC)    chronic cough and wheezing  . Dyspnea    easily  . GERD (gastroesophageal reflux disease)   . Hemorrhoids   . History of hiatal hernia   . Hypertension    controlled on meds  . Pneumonia 04/2019  . Prostate disorder   . PTSD (post-traumatic stress disorder)   . Schizophrenia (Luke Briggs)    Per patient's report.    Past Surgical History:  Procedure Laterality Date  . CATARACT EXTRACTION W/PHACO Left 03/28/2020   Procedure: CATARACT EXTRACTION PHACO AND INTRAOCULAR LENS PLACEMENT ([place]) LEFT MALYUGIN  MILOOP,   5.29  00:41.0;  Surgeon: Birder Robson, MD;  Location: [place];  Service: Ophthalmology;  Laterality: Left;  . COLONOSCOPY WITH PROPOFOL N/A 12/14/2020   Procedure: COLONOSCOPY WITH PROPOFOL;  Surgeon: Jonathon Bellows, MD;  Location: [facility] ENDOSCOPY;  Service: Gastroenterology;  Laterality: N/A;  . EYE SURGERY  Left    injury from a nail  . FOOT SURGERY Left     Current Medications: Current Meds  Medication Sig  . albuterol (VENTOLIN HFA) 108 (90 Base) MCG/ACT inhaler Inhale 2 puffs into the lungs every 6 (six) hours as needed for wheezing orshortness of breath.  [name] atorvastatin (LIPITOR) 10 MG tablet Take 1 tablet (10 mg total) by mouth daily.  [name] EPINEPHrine 0.3 mg/0.3 mL IJ SOAJ injection Inject 0.3 mLs (0.3 mg total) into the muscle as needed for anaphylaxis.  . finasteride (PROSCAR) 5 MG tablet Take  1 tablet (5 mg total) by mouth daily.  . hydrocortisone (ANUSOL-HC) 2.5 % rectal cream Place 1 application rectally 2 (two) times daily. (Patient taking differently: Place 1 application rectally 2 (two) times daily as needed.)  . Ipratropium-Albuterol (COMBIVENT) 20-100 MCG/ACT AERS respimat Inhale 1 puff into the lungs every 6 (six) hours.  . montelukast (SINGULAIR) 10 MG tablet Take 1 tablet (10 mg total) by mouth at bedtime.  [name] omeprazole (PRILOSEC) 40 MG capsule Take 1 capsule (40 mg total) by mouth daily.  . promethazine (PHENERGAN) 12.5 MG tablet Take 1 tablet (12.5 mg total) by mouth every 6 (six) hours as needed.  . sildenafil (VIAGRA) 50 MG tablet Take 1 tablet (50 mg total) by mouth daily as needed for erectile dysfunction.  [name] STIOLTO RESPIMAT 2.5-2.5 MCG/ACT AERS Inhale 2 puffs into the lungs daily.  [name] sulfamethoxazole-trimethoprim (BACTRIM DS) 800-160 MG tablet Take 1 tablet by mouth 2 (two) times daily.  . tamsulosin (FLOMAX) 0.4 MG CAPS capsule Take 1 capsule (0.4 mg total) by mouth daily.  [name] triamcinolone cream (KENALOG) 0.1 % Apply 1 application topically 2 (two) times daily.  . [DISCONTINUED] cloNIDine (CATAPRES) 0.2 MG tablet Take 1 tablet (0.2 mg total) by mouth 2 (two) times daily.  . [DISCONTINUED] diltiazem (CARDIZEM CD) 120 MG 24 hr capsule Take 1 capsule (120 mg total) by mouth 2 (two) times daily.     Allergies:   Lisinopril, Amlodipine, and Penicillins   Social History   Socioeconomic History  . Marital status: Significant Other    Spouse name: Not on file  . Number of children: Not on file  . Years of education: Not on file  . Highest education level: Not on file  Occupational History  . Not on file  Tobacco Use  . Smoking status: Current Every Day Smoker    Packs/day: 1.00    Years: 43.00    Pack years: 43.00    Types: Cigarettes  . Smokeless tobacco: Never Used  Vaping Use  . Vaping Use: Never used  Substance and Sexual Activity  . Alcohol use: Yes     Alcohol/week: 8.0 standard drinks    Types: 8 Cans of beer per week    Comment: beer  . Drug use: Never  . Sexual activity: Not on file  Other Topics Concern  . Not on file  Social History Narrative   Patient has new girlfriend they have been dating almost a year.    Social Determinants of Health   Financial Resource Strain: Not on file  Food Insecurity: Not on file  Transportation Needs: Not on file  Physical Activity: Not on file  Stress: Not on file  Social Connections: Not on file     Family History: The patient's family history includes Emphysema in his father and maternal grandmother; Heart disease in his father, maternal grandfather, mother, and sister; Osteoporosis in his mother and sister.  ROS:   Please  see the history of present illness.     All other systems reviewed and are negative.  EKGs/Labs/Other Studies Reviewed:    The following studies were reviewed today:  TTE 11/18/2019 1. Left ventricular ejection fraction, by visual estimation, is 60 to 65%. The left ventricle has normal function. There is no left ventricular hypertrophy.  2. The left ventricle has no regional wall motion abnormalities.  3. Global right ventricle has normal systolic function.The right ventricular size is normal. No increase in right ventricular wall thickness.  4. Left atrial size was normal.  5. Right atrial size was normal.  6. The mitral valve is normal in structure. No evidence of mitral valve regurgitation. No evidence of mitral stenosis.  7. The tricuspid valve is normal in structure.  8. The aortic valve was not well visualized. Aortic valve regurgitation is not visualized. No evidence of aortic valve sclerosis or stenosis.  9. The pulmonic valve was not well visualized. Pulmonic valve regurgitation is not visualized. 10. Normal pulmonary artery systolic pressure. 11. The inferior vena cava is normal in size with greater than 50% respiratory variability, suggesting right  atrial pressure of 3 mmHg.  EKG:  EKG is  ordered today.  The ekg ordered today demonstrates normal sinus rhythm, normal ECG.  Recent Labs: 01/10/2021: ALT 18; BUN 8; Creatinine, Ser 0.83; Hemoglobin 16.8; Platelets 292; Potassium 4.0; Sodium 137; TSH 1.500  Recent Lipid Panel    Component Value Date/Time   CHOL 214 (H) 01/10/2021 1502   CHOL 171 03/22/2020 1128   TRIG 134 01/10/2021 1502   TRIG 173 (H) 03/22/2020 1128   HDL 55 01/10/2021 1502   VLDL 35 (H) 03/22/2020 1128   LDLCALC 135 (H) 01/10/2021 1502    Physical Exam:    VS:  BP (!) 158/90 (BP Location: Left Arm, Patient Position: Sitting, Cuff Size: Normal)   Pulse 76   Ht 5\' 10"  (1.778 m)   Wt 190 lb 2 oz (86.2 kg)   SpO2 98%   BMI 27.28 kg/m     Wt Readings from Last 3 Encounters:  01/11/21 190 lb 2 oz (86.2 kg)  01/10/21 189 lb 3.2 oz (85.8 kg)  12/28/20 189 lb (85.7 kg)     GEN:  Well nourished, well developed in no acute distress,  HEENT: Normal NECK: No JVD; No carotid bruits LYMPHATICS: No lymphadenopathy CARDIAC: RRR, no murmurs, rubs, gallops RESPIRATORY: Decreased breath sounds ABDOMEN: Soft, non-tender, non-distended MUSCULOSKELETAL:  No edema; No deformity  SKIN: Warm and dry NEUROLOGIC:  Alert and oriented x 3 PSYCHIATRIC:  Normal affect   ASSESSMENT:    1. Chest pain of uncertain etiology   2. Primary hypertension   3. Smoking    PLAN:    In order of problems listed above:  1. Patient with chest pressure and diaphoresis.  Has risk factors of hypertension, current smoker.  Previous echocardiogram on 08/2019 showed normal systolic function EF 60 to 65%.  Patient advised to obtain [facility] as scheduled. 2. Blood pressure is improved but still not controlled.  Continue Cardizem 120 mg twice daily, continue clonidine 0.2 mg twice daily.  Smoking and alcohol intake contributing to blood pressures.   3. Current smoker.  smoking cessation advised.  Follow-up after stress  test.    Medication Adjustments/Labs and Tests Ordered: Current medicines are reviewed at length with the patient today.  Concerns regarding medicines are outlined above.  Orders Placed This Encounter  Procedures  . EKG 12-Lead   Meds ordered this  encounter  Medications  . cloNIDine (CATAPRES) 0.2 MG tablet    Sig: Take 1 tablet (0.2 mg total) by mouth 2 (two) times daily.    Dispense:  60 tablet    Refill:  3  . diltiazem (CARDIZEM CD) 120 MG 24 hr capsule    Sig: Take 1 capsule (120 mg total) by mouth 2 (two) times daily.    Dispense:  60 capsule    Refill:  3    Patient Instructions  Medication Instructions:   Your physician recommends that you continue on your current medications as directed. Please refer to the Current Medication list given to you today.  *If you need a refill on your cardiac medications before your next appointment, please call your pharmacy*   Lab Work: None ordered If you have labs (blood work) drawn today and your tests are completely normal, you will receive your results only by: [name] MyChart Message (if you have MyChart) OR . A paper copy in the mail If you have any lab test that is abnormal or we need to change your treatment, we will call you to review the results.   Testing/Procedures:  [place]   Your caregiver has ordered a Stress Test with nuclear imaging. The purpose of this test is to evaluate the blood supply to your heart muscle. This procedure is referred to as a "Non-Invasive Stress Test." This is because other than having an IV started in your vein, nothing is inserted or "invades" your body. Cardiac stress tests are done to find areas of poor blood flow to the heart by determining the extent of coronary artery disease (CAD). Some patients exercise on a treadmill, which naturally increases the blood flow to your heart, while others who are  unable to walk on a treadmill due to physical limitations have a pharmacologic/chemical  stress agent called Lexiscan . This medicine will mimic walking on a treadmill by temporarily increasing your coronary blood flow.      PLEASE REPORT TO [facility] MEDICAL MALL ENTRANCE   THE VOLUNTEERS AT THE FIRST DESK WILL DIRECT YOU WHERE TO GO     *Please note: these test may take anywhere between 2-4 hours to complete       Date of Procedure:_____________________________________   Arrival Time for Procedure:______________________________    PLEASE NOTIFY THE OFFICE AT LEAST 24 HOURS IN ADVANCE IF YOU ARE UNABLE TO KEEP YOUR APPOINTMENT.  [place] 24 HOURS IN ADVANCE IF YOU ARE UNABLE TO KEEP YOUR APPOINTMENT. [phone]       How to prepare for your Myoview test:    1.         Do not eat or drink after midnight  2.         No caffeine for 24 hours prior to test  3.         No smoking 24 hours prior to test.  4.         Unless instructed otherwise, Take your medication with a small sips of water.         5.         No perfume, cologne or lotion.        Follow-Up: At [facility], you and your health needs are our priority.  As part of our continuing mission to provide you with exceptional heart care, we have created designated Provider Care Teams.  These Care Teams include your primary Cardiologist (physician) and Advanced Practice  Providers (APPs -  Physician Assistants and Nurse Practitioners) who all work together to provide you with the care you need, when you need it.  We recommend signing up for the patient portal called "MyChart".  Sign up information is provided on this After Visit Summary.  MyChart is used to connect with patients for Virtual Visits (Telemedicine).  Patients are able to view lab/test results, encounter notes, upcoming appointments, etc.  Non-urgent messages can be sent to your provider as well.   To learn more about what you can do with MyChart, go to [email or web address].     Your next appointment:   1 month(s)  The format for your next appointment:   In Person  Provider:   Kate Sable, MD   Other Instructions     Signed, Luke Sable, MD  01/11/2021 12:32 PM    [place]

## 2021-01-11 NOTE — Progress Notes (Signed)
Contacted via [place] morning Stan, your labs have returned and overall look good with exception of cholesterol levels which are elevated.  I would recommend we start a low dose of cholesterol medication to help lower these levels and prevent stroke.  I am going to send this in and want you to start taking daily.  We will then recheck levels next visit.  Any questions? Keep being awesome!!  Thank you for allowing me to participate in your care. Kindest regards, Minh [place]

## 2021-01-11 NOTE — Patient Instructions (Signed)
Medication Instructions:   Your physician recommends that you continue on your current medications as directed. Please refer to the Current Medication list given to you today.  *If you need a refill on your cardiac medications before your next appointment, please call your pharmacy*   Lab Work: None ordered If you have labs (blood work) drawn today and your tests are completely normal, you will receive your results only by: [name] MyChart Message (if you have MyChart) OR . A paper copy in the mail If you have any lab test that is abnormal or we need to change your treatment, we will call you to review the results.   Testing/Procedures:  [place]   Your caregiver has ordered a Stress Test with nuclear imaging. The purpose of this test is to evaluate the blood supply to your heart muscle. This procedure is referred to as a "Non-Invasive Stress Test." This is because other than having an IV started in your vein, nothing is inserted or "invades" your body. Cardiac stress tests are done to find areas of poor blood flow to the heart by determining the extent of coronary artery disease (CAD). Some patients exercise on a treadmill, which naturally increases the blood flow to your heart, while others who are  unable to walk on a treadmill due to physical limitations have a pharmacologic/chemical stress agent called Lexiscan . This medicine will mimic walking on a treadmill by temporarily increasing your coronary blood flow.      PLEASE REPORT TO [facility] MEDICAL MALL ENTRANCE   THE VOLUNTEERS AT THE FIRST DESK WILL DIRECT YOU WHERE TO GO     *Please note: these test may take anywhere between 2-4 hours to complete       Date of Procedure:_____________________________________   Arrival Time for Procedure:______________________________    PLEASE NOTIFY THE OFFICE AT LEAST 24 HOURS IN ADVANCE IF YOU ARE UNABLE TO KEEP YOUR APPOINTMENT.  [place] 24 HOURS IN ADVANCE IF YOU ARE UNABLE TO KEEP YOUR APPOINTMENT. [phone]       How to prepare for your Myoview test:    1.         Do not eat or drink after midnight  2.         No caffeine for 24 hours prior to test  3.         No smoking 24 hours prior to test.  4.         Unless instructed otherwise, Take your medication with a small sips of water.         5.         No perfume, cologne or lotion.        Follow-Up: At [facility], you and your health needs are our priority.  As part of our continuing mission to provide you with exceptional heart care, we have created designated Provider Care Teams.  These Care Teams include your primary Cardiologist (physician) and Advanced Practice Providers (APPs -  Physician Assistants and Nurse Practitioners) who all work together to provide you with the care you need, when you need it.  We recommend signing up for the patient portal called "MyChart".  Sign up information is provided on this After Visit Summary.  MyChart is used to connect with patients for Virtual Visits (Telemedicine).  Patients are able to view lab/test results, encounter notes, upcoming appointments, etc.  Non-urgent messages can be sent to your provider as well.  To learn more about what you can do with MyChart, go to [email or web address].    Your next appointment:   1 month(s)  The format for your next appointment:   In Person  Provider:   Kate Sable, MD   Other Instructions

## 2021-01-13 LAB — GC/CHLAMYDIA PROBE AMP
Chlamydia trachomatis, NAA: NEGATIVE
Neisseria Gonorrhoeae by PCR: NEGATIVE

## 2021-01-13 NOTE — Progress Notes (Signed)
Contacted via MyChart   Good morning Luke Briggs, your urine GC/Chlamydia testing returned negative:)

## 2021-01-15 ENCOUNTER — Ambulatory Visit: Payer: Medicaid Other | Admitting: Nurse Practitioner

## 2021-01-24 ENCOUNTER — Ambulatory Visit: Payer: Self-pay | Admitting: *Deleted

## 2021-01-24 ENCOUNTER — Telehealth: Payer: Self-pay | Admitting: Cardiology

## 2021-01-24 ENCOUNTER — Encounter
Admission: RE | Admit: 2021-01-24 | Discharge: 2021-01-24 | Disposition: A | Discharge status: Home / Self Care | Payer: Medicaid Other | Source: Ambulatory Visit | Attending: Cardiology | Admitting: Cardiology

## 2021-01-24 ENCOUNTER — Other Ambulatory Visit: Payer: Self-pay

## 2021-01-24 DIAGNOSIS — R079 Chest pain, unspecified: Secondary | ICD-10-CM | POA: Diagnosis present

## 2021-01-24 MED ORDER — TECHNETIUM TC 99M TETROFOSMIN IV KIT
30.0000 | PACK | Freq: Once | INTRAVENOUS | Status: AC | PRN
  Administered 2021-01-24: 32.289 via INTRAVENOUS

## 2021-01-24 MED ORDER — REGADENOSON 0.4 MG/5ML IV SOLN
0.4000 mg | Freq: Once | INTRAVENOUS | Status: AC
  Administered 2021-01-24: 0.4 mg via INTRAVENOUS

## 2021-01-24 MED ORDER — TECHNETIUM TC 99M TETROFOSMIN IV KIT
9.8000 | PACK | Freq: Once | INTRAVENOUS | Status: AC | PRN
  Administered 2021-01-24: 9.8 via INTRAVENOUS

## 2021-01-24 NOTE — Telephone Encounter (Signed)
Pt reports diarrhea x 4 days. States watery "All water." Reports stool is black today,not previous days, with coffee ground appearance, no visible blood, "Smelly."   Also reports abdominal cramping. Has had 4 episodes diarrhea today. Also reports nausea, no vomiting. Started Bactrim DS 01/10/21 to take  for total of 4 weeks. Last dose yesterday "Had stress test today so didn't take." States is staying hydrated, "I drink water all day long." urination WNL, no dizziness. Assured pt NT would route to practice for PCPs review. Pt offered he will not go to ED/UC.  Please advise: [phone]  Reason for Disposition . [1] MODERATE diarrhea (e.g., 4-6 times / day more than normal) AND [2] present > 48 hours (2 days)  Answer Assessment - Initial Assessment Questions 1. DIARRHEA SEVERITY: "How bad is the diarrhea?" "How many extra stools have you had in the past 24 hours than normal?"    - NO DIARRHEA (SCALE 0)   - MILD (SCALE 1-3): Few loose or mushy BMs; increase of 1-3 stools over normal daily number of stools; mild increase in ostomy output.   -  MODERATE (SCALE 4-7): Increase of 4-6 stools daily over normal; moderate increase in ostomy output. * SEVERE (SCALE 8-10; OR 'WORST POSSIBLE'): Increase of 7 or more stools daily over normal; moderate increase in ostomy output; incontinence.     moderate 2. ONSET: "When did the diarrhea begin?"      4 days ago 3. BM CONSISTENCY: "How loose or watery is the diarrhea?"      Watery 4. VOMITING: "Are you also vomiting?" If Yes, ask: "How many times in the past 24 hours?"      No, nauseated 5. ABDOMINAL PAIN: "Are you having any abdominal pain?" If Yes, ask: "What does it feel like?" (e.g., crampy, dull, intermittent, constant)      Cramping 6. ABDOMINAL PAIN SEVERITY: If present, ask: "How bad is the pain?"  (e.g., Scale 1-10; mild, moderate, or severe)   - MILD (1-3): doesn't interfere with normal activities, abdomen soft and not tender to touch    -  MODERATE (4-7): interferes with normal activities or awakens from sleep, tender to touch    - SEVERE (8-10): excruciating pain, doubled over, unable to do any normal activities       moderate 7. ORAL INTAKE: If vomiting, "Have you been able to drink liquids?" "How much fluids have you had in the past 24 hours?"    Drink water all day 8. HYDRATION: "Any signs of dehydration?" (e.g., dry mouth [not just dry lips], too weak to stand, dizziness, new weight loss) "When did you last urinate?"     no 9. EXPOSURE: "Have you traveled to a foreign country recently?" "Have you been exposed to anyone with diarrhea?" "Could you have eaten any food that was spoiled?"     no 10. ANTIBIOTIC USE: "Are you taking antibiotics now or have you taken antibiotics in the past 2 months?"       Yes, Bactrim DS. 11. OTHER SYMPTOMS: "Do you have any other symptoms?" (e.g., fever, blood in stool)       Black, "Smelly"  Protocols used: DIARRHEA-A-AH

## 2021-01-24 NOTE — Telephone Encounter (Signed)
Agree, stop antibiotic and if any worsening or ongoing symptoms I would like him to immediately be seen.  My concern is the dark stools, if this continued I want him seen immediately.

## 2021-01-24 NOTE — Telephone Encounter (Signed)
Patient calling  Had a stress test this morning and would like to know if one of the side effects of the dye used could cause black diarrhea Please call to discuss

## 2021-01-24 NOTE — Telephone Encounter (Signed)
Pt stated he would wait for his apt on 02/13/2021 declined being seen sooner or scheduling with some one else. Pt also stated he completly stopped taking antibiotics as he states he thinks that's the reason that is messing up his stomach.

## 2021-01-24 NOTE — Telephone Encounter (Signed)
DPR on file. Lmom. It is unlikely that the tracer given during his myoview would cause dark stool/diarrhea. He is not on a anticoag/blood thinner.  Recommend that the he contact his pcp for their recommendation. He is to call back if any questions.

## 2021-01-25 ENCOUNTER — Telehealth: Payer: Self-pay | Admitting: Cardiology

## 2021-01-25 ENCOUNTER — Telehealth: Payer: Self-pay

## 2021-01-25 ENCOUNTER — Ambulatory Visit
Admission: RE | Admit: 2021-01-25 | Discharge: 2021-01-25 | Disposition: A | Discharge status: Home / Self Care | Payer: Medicaid Other | Source: Ambulatory Visit | Attending: Vascular Surgery | Admitting: Vascular Surgery

## 2021-01-25 ENCOUNTER — Telehealth (INDEPENDENT_AMBULATORY_CARE_PROVIDER_SITE_OTHER): Payer: Self-pay

## 2021-01-25 DIAGNOSIS — R55 Syncope and collapse: Secondary | ICD-10-CM | POA: Insufficient documentation

## 2021-01-25 DIAGNOSIS — I6523 Occlusion and stenosis of bilateral carotid arteries: Secondary | ICD-10-CM | POA: Diagnosis not present

## 2021-01-25 IMAGING — CT CT ANGIO HEAD
3 of 11 series · 16 of 47 positions shown · IV contrast (APPLIED)
Comparison: Head MRI [DATE]

CLINICAL DATA: Carotid artery stenosis. Syncope. Ataxia. Headaches,
dizziness, visual changes, and tinnitus intermittently for 7-8
months.

EXAM:
CT ANGIOGRAPHY HEAD
TECHNIQUE: Multidetector CT imaging of the head was performed using the
standard protocol during bolus administration of intravenous
contrast. Multiplanar CT image reconstructions and MIPs were
obtained to evaluate the vascular anatomy.
CONTRAST:  75mL OMNIPAQUE IOHEXOL 350 MG/ML SOLN

[Series 10: ax thin · axial · 0.36mm/px · z∈[+340,+460]mm · 10 of 151 slices shown]
[im 14/151  brain]
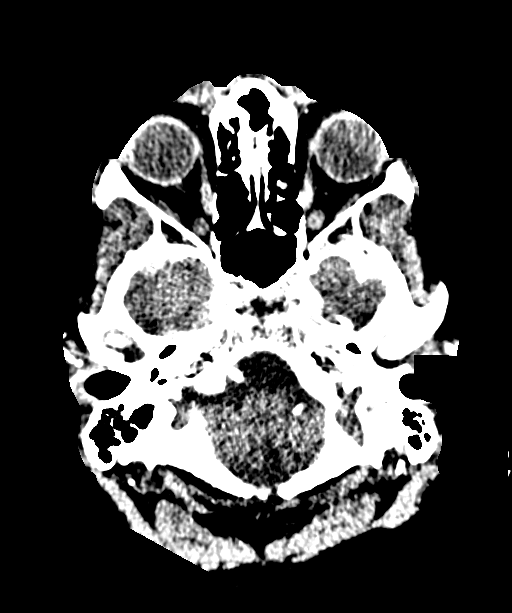
[im 28/151  bone]
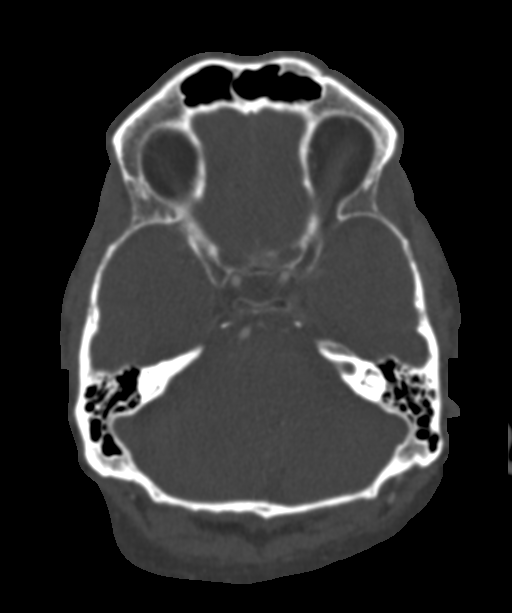
[im 41/151  brain]
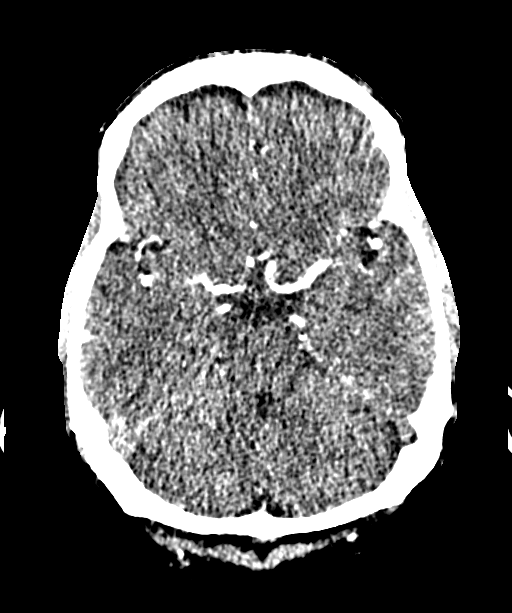
[im 55/151  bone]
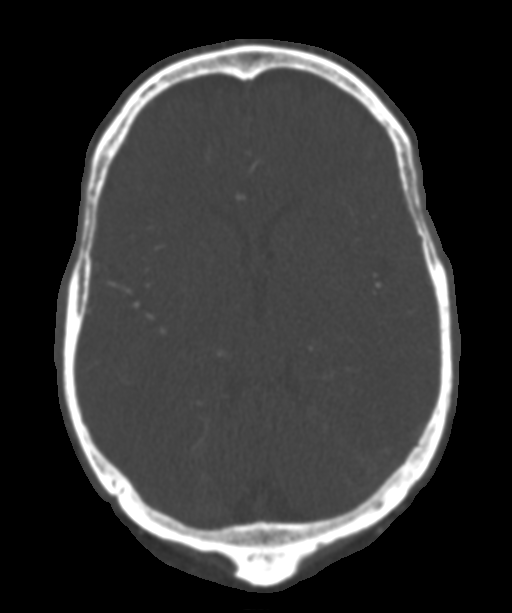
[im 69/151  brain]
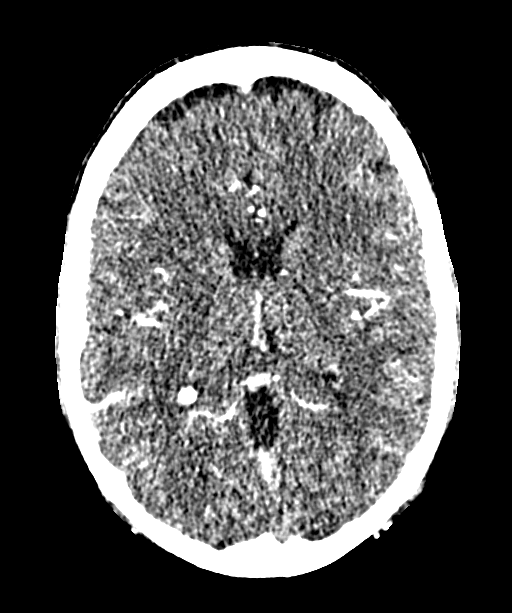
[im 82/151  bone]
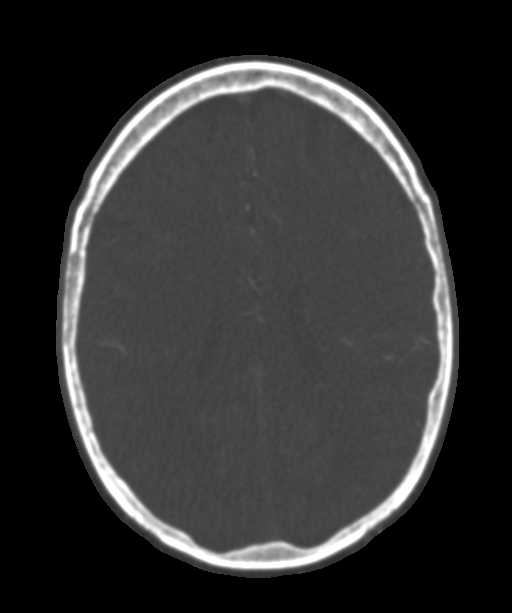
[im 96/151  brain]
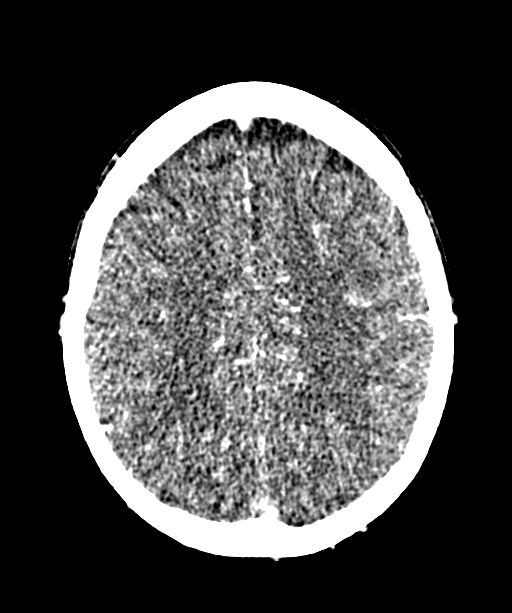
[im 110/151  bone]
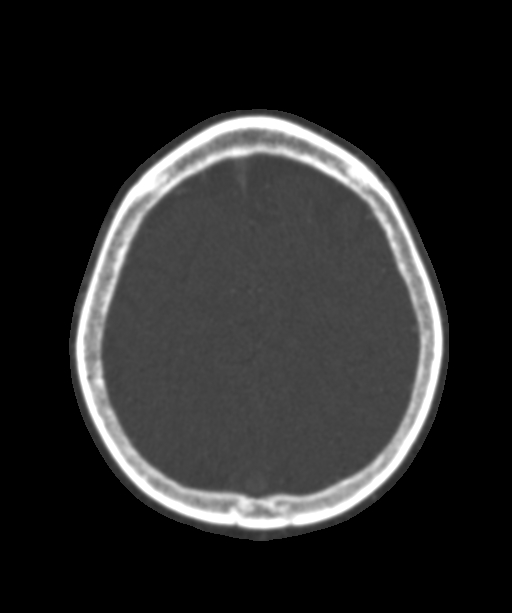
[im 123/151  brain]
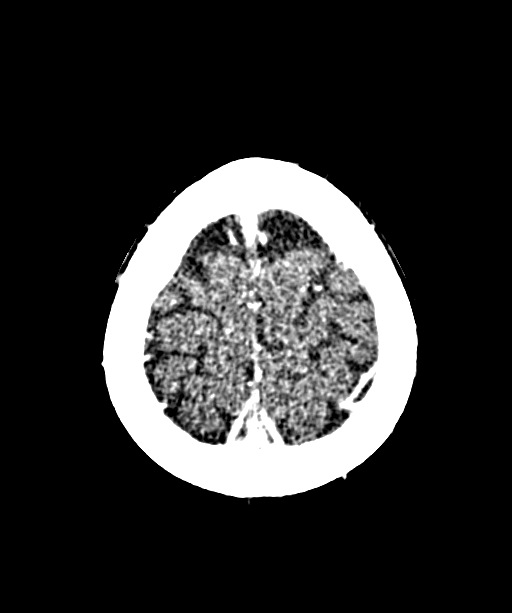
[im 137/151  bone]
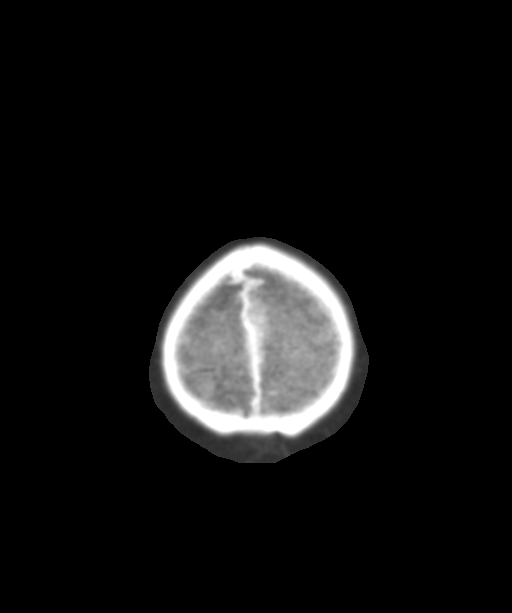

[Series 12: cor thin · coronal · 0.34mm/px · 3 of 193 slices shown]
[im 39/193  brain]
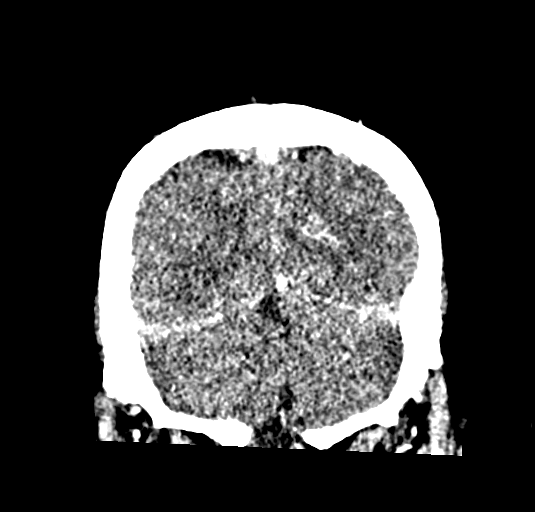
[im 77/193  brain]
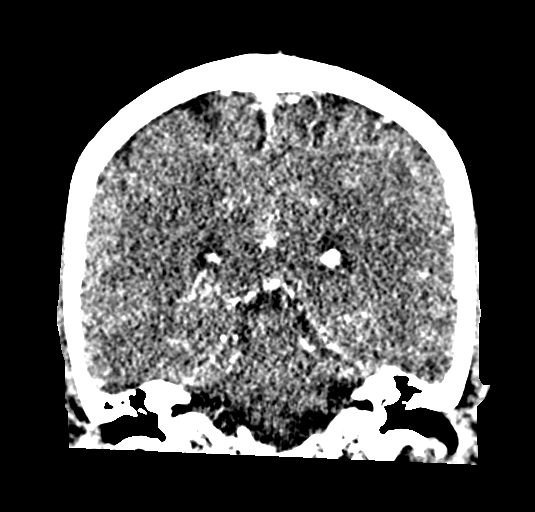
[im 116/193  brain]
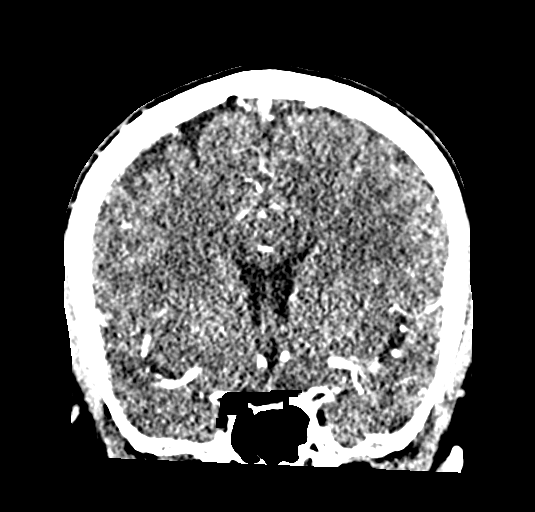

[Series 14: sag thin · sagittal · 0.34mm/px · 3 of 153 slices shown]
[im 39/153  brain]
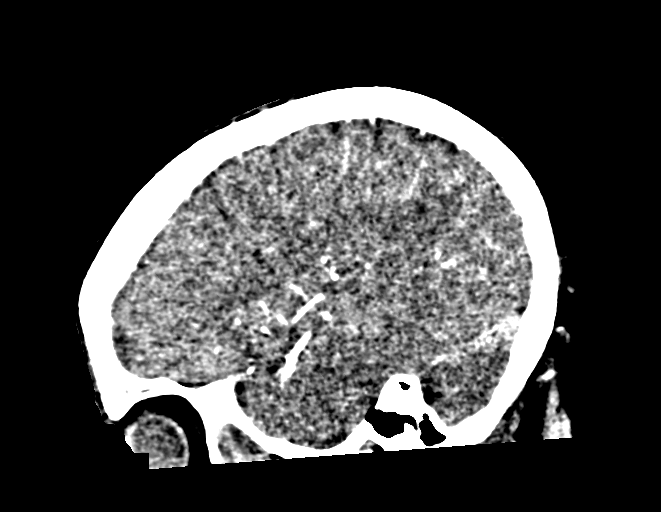
[im 77/153  brain]
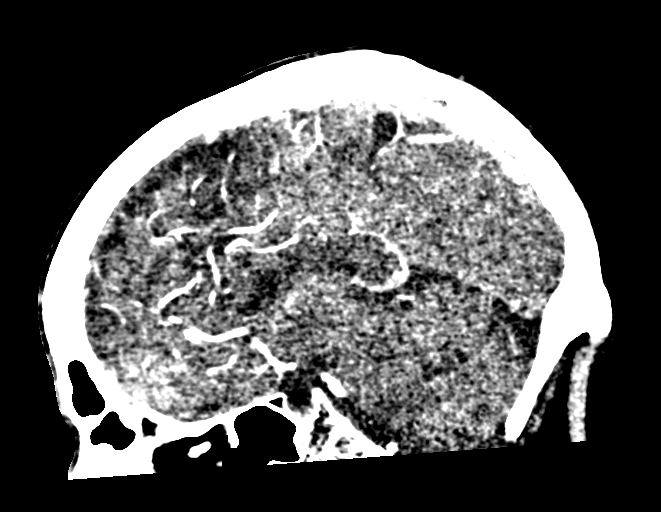
[im 115/153  brain]
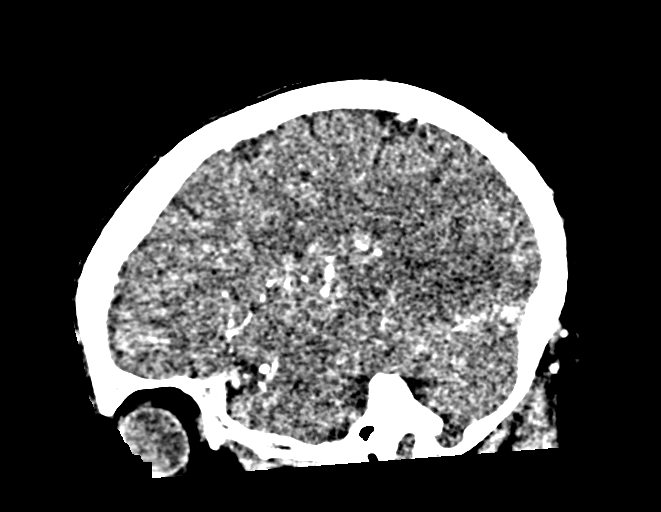

[16 of 47 positions shown; findings below may reference images not displayed]

FINDINGS: CT HEAD

Brain: There is no evidence of an acute infarct, intracranial
hemorrhage, mass, midline shift, or extra-axial fluid collection. A
chronic lacunar infarct in the right basal ganglia/corona radiata is
unchanged. The ventricles and sulci are normal.

Vascular: No hyperdense vessel.

Skull: No fracture or suspicious osseous lesion.

Sinuses: Visualized paranasal sinuses and mastoid air cells are
clear. Left cataract extraction at

Orbits: Left cataract extraction.

CTA HEAD

Anterior circulation: The internal carotid arteries are widely
patent from skull base to carotid termini. ACAs and MCAs are patent
without evidence of a proximal branch occlusion or significant
proximal stenosis. No aneurysm is identified.

Posterior circulation: The visualized distal vertebral arteries are
patent with the left being strongly dominant. Patent left PICA and
bilateral SCA origins are identified. Posterior communicating
arteries are diminutive or absent. The PCAs are patent with branch
vessel irregularity but no significant proximal stenosis. No
aneurysm is identified.

Venous sinuses: Not well evaluated due to arterial contrast timing.

Anatomic variants: Hypoplastic right vertebral artery.
IMPRESSION: 1. No major intracranial arterial occlusion, significant proximal
stenosis, or aneurysm.
2. Chronic right basal ganglia lacunar infarct.

## 2021-01-25 MED ORDER — IOHEXOL 350 MG/ML SOLN
75.0000 mL | Freq: Once | INTRAVENOUS | Status: AC | PRN
  Administered 2021-01-25: 75 mL via INTRAVENOUS

## 2021-01-25 NOTE — Telephone Encounter (Signed)
Called patient to set f/u.  Left VM.  AP

## 2021-01-25 NOTE — Telephone Encounter (Signed)
Called patient and gave him the following Myoview resutls:  No evidence for ischemia on stress test. Low risk study.

## 2021-01-25 NOTE — Telephone Encounter (Signed)
The pt called an left a Vm on the nurses line saying that he got his CT Dr. Delana Meyer sent him to [facility] to get and now her needs to be scheduled to come in for the results.

## 2021-01-25 NOTE — Telephone Encounter (Signed)
Pt states he did stop the antibiotic and asking if something else can be sent instead Pt did state the dark stool has topped.

## 2021-01-25 NOTE — Telephone Encounter (Signed)
At this time I would remain off any abx for now to avoid worsening diarrhea.  Current treatment would have offered some coverage.  I will plan on follow-up as scheduled, but would like him seen in office ASAP if ongoing symptoms or worsening.

## 2021-01-25 NOTE — Telephone Encounter (Signed)
Copied from [place] [id number]. Topic: General - Other >> Jan 25, 2021  4:13 PM Alanda Slim E wrote: Reason for CRM:  FYI - Pt was advised of message in NT note from [facility] / Pt stated he understood and would call if his symptoms worsened to schedule a sooner appt

## 2021-01-25 NOTE — Telephone Encounter (Signed)
° ° °  Please call with stress test results °

## 2021-01-25 NOTE — Telephone Encounter (Signed)
Lvm if calls back please read him message from provider today

## 2021-01-26 NOTE — Telephone Encounter (Signed)
Patient notified and verbalized understanding. 

## 2021-02-01 ENCOUNTER — Other Ambulatory Visit: Payer: Self-pay

## 2021-02-01 ENCOUNTER — Ambulatory Visit (INDEPENDENT_AMBULATORY_CARE_PROVIDER_SITE_OTHER): Payer: Medicaid Other | Admitting: Vascular Surgery

## 2021-02-01 ENCOUNTER — Encounter (INDEPENDENT_AMBULATORY_CARE_PROVIDER_SITE_OTHER): Payer: Self-pay | Admitting: Vascular Surgery

## 2021-02-01 VITALS — BP 153/84 | HR 85 | Resp 16 | Wt 192.6 lb

## 2021-02-01 DIAGNOSIS — I6523 Occlusion and stenosis of bilateral carotid arteries: Secondary | ICD-10-CM | POA: Diagnosis not present

## 2021-02-01 DIAGNOSIS — I10 Essential (primary) hypertension: Secondary | ICD-10-CM | POA: Diagnosis not present

## 2021-02-01 DIAGNOSIS — M170 Bilateral primary osteoarthritis of knee: Secondary | ICD-10-CM

## 2021-02-01 NOTE — Progress Notes (Signed)
MRN : [id number]  Luke Briggs. is a 60 y.o. (1960/12/16) male who presents with chief complaint of No chief complaint on file. [name]  History of Present Illness:   The patient is seen for follow up evaluation of carotid stenosis status post CT angiogram. CT scan was done 01/25/2021.  Patient reports that the test went well with no problems or complications.   The patient denies interval amaurosis fugax. There is no recent or interval TIA symptoms or focal motor deficits. There is no prior documented CVA.  The patient is taking enteric-coated aspirin 81 mg daily.  There is no history of migraine headaches. There is no history of seizures.  The patient has a history of coronary artery disease, no recent episodes of angina or shortness of breath. The patient denies PAD or claudication symptoms. There is a history of hyperlipidemia which is being treated with a statin.    CT angiogram is reviewed by me personally and shows widely patent intracranial arteries. Previous MRA of the neck shows widely patent RICA and a 59% LICA  No outpatient medications have been marked as taking for the 02/01/21 encounter (Appointment) with Delana Meyer, Dolores Lory, MD.    Past Medical History:  Diagnosis Date  . Angioedema 08/07/2019  . Arthritis    knees,shoulders, ankles, most joints  . Cervical disc disorder    difficuly moving neck left  . COPD (chronic obstructive pulmonary disease) (HCC)    chronic cough and wheezing  . Dyspnea    easily  . GERD (gastroesophageal reflux disease)   . Hemorrhoids   . History of hiatal hernia   . Hypertension    controlled on meds  . Pneumonia 04/2019  . Prostate disorder   . PTSD (post-traumatic stress disorder)   . Schizophrenia ([place])    Per patient's report.    Past Surgical History:  Procedure Laterality Date  . CATARACT EXTRACTION W/PHACO Left 03/28/2020   Procedure: CATARACT EXTRACTION PHACO AND INTRAOCULAR LENS PLACEMENT ([place]) LEFT MALYUGIN   MILOOP,   5.29  00:41.0;  Surgeon: Birder Robson, MD;  Location: [facility];  Service: Ophthalmology;  Laterality: Left;  . COLONOSCOPY WITH PROPOFOL N/A 12/14/2020   Procedure: COLONOSCOPY WITH PROPOFOL;  Surgeon: Jonathon Bellows, MD;  Location: [facility] ENDOSCOPY;  Service: Gastroenterology;  Laterality: N/A;  . EYE SURGERY Left    injury from a nail  . FOOT SURGERY Left     Social History Social History   Tobacco Use  . Smoking status: Current Every Day Smoker    Packs/day: 1.00    Years: 43.00    Pack years: 43.00    Types: Cigarettes  . Smokeless tobacco: Never Used  Vaping Use  . Vaping Use: Never used  Substance Use Topics  . Alcohol use: Yes    Alcohol/week: 8.0 standard drinks    Types: 8 Cans of beer per week    Comment: beer  . Drug use: Never    Family History Family History  Problem Relation Age of Onset  . Heart disease Mother   . Osteoporosis Mother   . Heart disease Father   . Emphysema Father   . Heart disease Sister   . Emphysema Maternal Grandmother   . Heart disease Maternal Grandfather   . Osteoporosis Sister     Allergies  Allergen Reactions  . Lisinopril Swelling    Reaction: angioedema  . Amlodipine     Dizziness and edema with this  . Penicillins Rash  Reaction: Feels like skin is on fire Did it involve swelling of the face/tongue/throat, SOB, or low BP? No Did it involve sudden or severe rash/hives, skin peeling, or any reaction on the inside of your mouth or nose? No Did you need to seek medical attention at a hospital or doctor's office? Yes When did it last happen?30 years ago If all above answers are "NO", may proceed with cephalosporin use.      REVIEW OF SYSTEMS (Negative unless checked)  Constitutional: [] Weight loss  [] Fever  [] Chills Cardiac: [] Chest pain   [] Chest pressure   [] Palpitations   [] Shortness of breath when laying flat   [] Shortness of breath with exertion. Vascular:  [] Pain in legs with walking    [] Pain in legs at rest  [] History of DVT   [] Phlebitis   [] Swelling in legs   [] Varicose veins   [] Non-healing ulcers Pulmonary:   [] Uses home oxygen   [] Productive cough   [] Hemoptysis   [] Wheeze  [] COPD   [] Asthma Neurologic:  [] Dizziness   [] Seizures   [x] History of stroke   [x] History of TIA  [] Aphasia   [] Vissual changes   [] Weakness or numbness in arm   [] Weakness or numbness in leg Musculoskeletal:   [] Joint swelling   [] Joint pain   [] Low back pain Hematologic:  [] Easy bruising  [] Easy bleeding   [] Hypercoagulable state   [] Anemic Gastrointestinal:  [] Diarrhea   [] Vomiting  [] Gastroesophageal reflux/heartburn   [] Difficulty swallowing. Genitourinary:  [] Chronic kidney disease   [] Difficult urination  [] Frequent urination   [] Blood in urine Skin:  [] Rashes   [] Ulcers  Psychological:  [] History of anxiety   []  History of major depression.  Physical Examination  There were no vitals filed for this visit. There is no height or weight on file to calculate BMI. Gen: WD/WN, NAD Head: [place]/AT, No temporalis wasting.  Ear/Nose/Throat: Hearing grossly intact, nares w/o erythema or drainage Eyes: PER, EOMI, sclera nonicteric.  Neck: Supple, no large masses.   Pulmonary:  Good air movement, no audible wheezing bilaterally, no use of accessory muscles.  Cardiac: RRR, no JVD Vascular:  Vessel Right Left  Radial Palpable Palpable  Brachial Palpable Palpable  Carotid Palpable Palpable  Gastrointestinal: Non-distended. No guarding/no peritoneal signs.  Musculoskeletal: M/S 5/5 throughout.  No deformity or atrophy.  Neurologic: CN 2-12 intact. Symmetrical.  Speech is fluent. Motor exam as listed above. Psychiatric: Judgment intact, Mood & affect appropriate for pt's clinical situation. Dermatologic: No rashes or ulcers noted.  No changes consistent with cellulitis.   CBC Lab Results  Component Value Date   WBC 9.9 01/10/2021   HGB 16.8 01/10/2021   HCT 48.7 01/10/2021   MCV 91 01/10/2021    PLT 292 01/10/2021    BMET    Component Value Date/Time   NA 137 01/10/2021 1502   K 4.0 01/10/2021 1502   CL 100 01/10/2021 1502   CO2 21 01/10/2021 1502   GLUCOSE 82 01/10/2021 1502   GLUCOSE 114 (H) 10/30/2020 1604   BUN 8 01/10/2021 1502   CREATININE 0.83 01/10/2021 1502   CALCIUM 9.5 01/10/2021 1502   GFRNONAA 98 01/10/2021 1502   GFRNONAA >60 10/30/2020 1604   GFRAA 113 01/10/2021 1502   CrCl cannot be calculated (Patient's most recent lab result is older than the maximum 21 days allowed.).  COAG No results found for: INR, PROTIME  Radiology CT ANGIO HEAD W OR WO CONTRAST  Result Date: 01/25/2021 CLINICAL DATA:  Carotid artery stenosis. Syncope. Ataxia. Headaches, dizziness, visual changes,  and tinnitus intermittently for 7-8 months. EXAM: CT ANGIOGRAPHY HEAD TECHNIQUE: Multidetector CT imaging of the head was performed using the standard protocol during bolus administration of intravenous contrast. Multiplanar CT image reconstructions and MIPs were obtained to evaluate the vascular anatomy. CONTRAST:  8mL OMNIPAQUE IOHEXOL 350 MG/ML SOLN COMPARISON:  Head MRI 09/28/2020 FINDINGS: CT HEAD Brain: There is no evidence of an acute infarct, intracranial hemorrhage, mass, midline shift, or extra-axial fluid collection. A chronic lacunar infarct in the right basal ganglia/corona radiata is unchanged. The ventricles and sulci are normal. Vascular: No hyperdense vessel. Skull: No fracture or suspicious osseous lesion. Sinuses: Visualized paranasal sinuses and mastoid air cells are clear. Left cataract extraction at Orbits: Left cataract extraction. CTA HEAD Anterior circulation: The internal carotid arteries are widely patent from skull base to carotid termini. ACAs and MCAs are patent without evidence of a proximal branch occlusion or significant proximal stenosis. No aneurysm is identified. Posterior circulation: The visualized distal vertebral arteries are patent with the left being  strongly dominant. Patent left PICA and bilateral SCA origins are identified. Posterior communicating arteries are diminutive or absent. The PCAs are patent with branch vessel irregularity but no significant proximal stenosis. No aneurysm is identified. Venous sinuses: Not well evaluated due to arterial contrast timing. Anatomic variants: Hypoplastic right vertebral artery. IMPRESSION: 1. No major intracranial arterial occlusion, significant proximal stenosis, or aneurysm. 2. Chronic right basal ganglia lacunar infarct. Electronically Signed   By: Logan Bores M.D.   On: 01/25/2021 12:45   NM Myocar Multi W/Spect W/Wall Motion / EF  Result Date: 01/24/2021  T wave inversion was noted during stress in the V4, V5, V6 and II leads.  There was no ST segment deviation noted during stress.  The study is normal.  This is a low risk study.  The left ventricular ejection fraction is normal (55-65%).  CT attenuation images showed no significant aortic or coronary calcifications.      Assessment/Plan 1. Bilateral carotid artery stenosis Recommend:  Given the patient's asymptomatic subcritical stenosis no further invasive testing or surgery at this time.  Duplex ultrasound shows widely patent RICA and 86% LICA stenosis.  Continue antiplatelet therapy as prescribed Continue management of CAD, HTN and Hyperlipidemia Healthy heart diet,  encouraged exercise at least 4 times per week Follow up in 12 months with duplex ultrasound and physical exam.   - VAS US CAROTID; Future  2. Essential hypertension Continue antihypertensive medications as already ordered, these medications have been reviewed and there are no changes at this time.   3. Bilateral primary osteoarthritis of knee Continue NSAID medications as already ordered, these medications have been reviewed and there are no changes at this time.  Continued activity and therapy was stressed.     Hortencia Pilar, MD  02/01/2021 12:29 PM

## 2021-02-02 LAB — NM MYOCAR MULTI W/SPECT W/WALL MOTION / EF
LV dias vol: 117 mL (ref 62–150)
LV sys vol: 56 mL
Peak HR: 120 {beats}/min
Percent HR: 73 %
Rest HR: 61 {beats}/min
SDS: 3
SRS: 4
SSS: 4
TID: 1.21

## 2021-02-08 ENCOUNTER — Ambulatory Visit: Payer: Medicaid Other | Admitting: Nurse Practitioner

## 2021-02-08 ENCOUNTER — Ambulatory Visit (INDEPENDENT_AMBULATORY_CARE_PROVIDER_SITE_OTHER): Payer: Medicaid Other | Admitting: Vascular Surgery

## 2021-02-10 ENCOUNTER — Other Ambulatory Visit: Payer: Self-pay | Admitting: Nurse Practitioner

## 2021-02-10 MED ORDER — CLONIDINE HCL 0.1 MG PO TABS: 0.0500 mg | ORAL_TABLET | Freq: Every day | ORAL | 4 refills | Status: DC

## 2021-02-10 MED ORDER — NORTRIPTYLINE HCL 10 MG PO CAPS: ORAL_CAPSULE | ORAL | Status: DC

## 2021-02-10 NOTE — Progress Notes (Signed)
Adding neurology changes to med list

## 2021-02-13 ENCOUNTER — Encounter: Payer: Self-pay | Admitting: Nurse Practitioner

## 2021-02-13 ENCOUNTER — Other Ambulatory Visit: Payer: Self-pay

## 2021-02-13 ENCOUNTER — Ambulatory Visit (INDEPENDENT_AMBULATORY_CARE_PROVIDER_SITE_OTHER): Payer: Medicaid Other | Admitting: Nurse Practitioner

## 2021-02-13 ENCOUNTER — Encounter: Payer: Self-pay | Admitting: *Deleted

## 2021-02-13 VITALS — BP 148/92 | HR 84 | Temp 98.4°F | Wt 190.6 lb

## 2021-02-13 DIAGNOSIS — G43909 Migraine, unspecified, not intractable, without status migrainosus: Secondary | ICD-10-CM | POA: Insufficient documentation

## 2021-02-13 DIAGNOSIS — G43709 Chronic migraine without aura, not intractable, without status migrainosus: Secondary | ICD-10-CM

## 2021-02-13 DIAGNOSIS — F10288 Alcohol dependence with other alcohol-induced disorder: Secondary | ICD-10-CM

## 2021-02-13 DIAGNOSIS — F17219 Nicotine dependence, cigarettes, with unspecified nicotine-induced disorders: Secondary | ICD-10-CM

## 2021-02-13 DIAGNOSIS — J438 Other emphysema: Secondary | ICD-10-CM

## 2021-02-13 DIAGNOSIS — I10 Essential (primary) hypertension: Secondary | ICD-10-CM | POA: Diagnosis not present

## 2021-02-13 DIAGNOSIS — H9313 Tinnitus, bilateral: Secondary | ICD-10-CM | POA: Insufficient documentation

## 2021-02-13 DIAGNOSIS — Z8673 Personal history of transient ischemic attack (TIA), and cerebral infarction without residual deficits: Secondary | ICD-10-CM | POA: Insufficient documentation

## 2021-02-13 DIAGNOSIS — N401 Enlarged prostate with lower urinary tract symptoms: Secondary | ICD-10-CM

## 2021-02-13 DIAGNOSIS — K219 Gastro-esophageal reflux disease without esophagitis: Secondary | ICD-10-CM

## 2021-02-13 DIAGNOSIS — R350 Frequency of micturition: Secondary | ICD-10-CM

## 2021-02-13 LAB — URINALYSIS, ROUTINE W REFLEX MICROSCOPIC
Bilirubin, UA: NEGATIVE
Glucose, UA: NEGATIVE
Leukocytes,UA: NEGATIVE
Nitrite, UA: NEGATIVE
RBC, UA: NEGATIVE
Specific Gravity, UA: 1.02 (ref 1.005–1.030)
Urobilinogen, Ur: 0.2 mg/dL (ref 0.2–1.0)
pH, UA: 5.5 (ref 5.0–7.5)

## 2021-02-13 LAB — MICROSCOPIC EXAMINATION
Bacteria, UA: NONE SEEN
Epithelial Cells (non renal): NONE SEEN /hpf (ref 0–10)
RBC, Urine: NONE SEEN /hpf (ref 0–2)
WBC, UA: NONE SEEN /hpf (ref 0–5)

## 2021-02-13 MED ORDER — CLONIDINE HCL 0.2 MG PO TABS: 0.2000 mg | ORAL_TABLET | Freq: Every day | ORAL | 4 refills | Status: DC

## 2021-02-13 NOTE — Assessment & Plan Note (Signed)
I have recommended complete cessation of tobacco use. I have discussed various options available for assistance with tobacco cessation including over the counter methods (Nicotine gum, patch and lozenges). We also discussed prescription options (Chantix, Nicotine Inhaler / Nasal Spray). The patient is not interested in pursuing any prescription tobacco cessation options at this time.  

## 2021-02-13 NOTE — Assessment & Plan Note (Signed)
Referral sent to ENT for ongoing issues with tinnitus, suspect some related to heavy alcohol use.

## 2021-02-13 NOTE — Assessment & Plan Note (Signed)
Reports migraines for years and had some benefit with Clonidine.  Will restart this and stop Nortriptyline as never took this.  Continue to collaborate with neurology.  Clonidine may also offer benefit to BP and PTSD.  Recommend reduction of alcohol intake.  Return in 6 weeks.

## 2021-02-13 NOTE — Patient Instructions (Signed)
Food Choices for Gastroesophageal Reflux Disease, Adult When you have gastroesophageal reflux disease (GERD), the foods you eat and your eating habits are very important. Choosing the right foods can help ease your discomfort. Think about working with a food expert (dietitian) to help you make good choices. What are tips for following this plan? Reading food labels  Look for foods that are low in saturated fat. Foods that may help with your symptoms include: ? Foods that have less than 5% of daily value (DV) of fat. ? Foods that have 0 grams of trans fat. Cooking  Do not fry your food.  Cook your food by baking, steaming, grilling, or broiling. These are all methods that do not need a lot of fat for cooking.  To add flavor, try to use herbs that are low in spice and acidity. Meal planning  Choose healthy foods that are low in fat, such as: ? Fruits and vegetables. ? Whole grains. ? Low-fat dairy products. ? Lean meats, fish, and poultry.  Eat small meals often instead of eating 3 large meals each day. Eat your meals slowly in a place where you are relaxed. Avoid bending over or lying down until 2-3 hours after eating.  Limit high-fat foods such as fatty meats or fried foods.  Limit your intake of fatty foods, such as oils, butter, and shortening.  Avoid the following as told by your doctor: ? Foods that cause symptoms. These may be different for different people. Keep a food diary to keep track of foods that cause symptoms. ? Alcohol. ? Drinking a lot of liquid with meals. ? Eating meals during the 2-3 hours before bed.   Lifestyle  Stay at a healthy weight. Ask your doctor what weight is healthy for you. If you need to lose weight, work with your doctor to do so safely.  Exercise for at least 30 minutes on 5 or more days each week, or as told by your doctor.  Wear loose-fitting clothes.  Do not smoke or use any products that contain nicotine or tobacco. If you need help  quitting, ask your doctor.  Sleep with the head of your bed higher than your feet. Use a wedge under the mattress or blocks under the bed frame to raise the head of the bed.  Chew sugar-free gum after meals. What foods should eat? Eat a healthy, well-balanced diet of fruits, vegetables, whole grains, low-fat dairy products, lean meats, fish, and poultry. Each person is different. Foods that may cause symptoms in one person may not cause any symptoms in another person. Work with your doctor to find foods that are safe for you. The items listed above may not be a complete list of what you can eat and drink. Contact a food expert for more options.   What foods should I avoid? Limiting some of these foods may help in managing the symptoms of GERD. Everyone is different. Talk with a food expert or your doctor to help you find the exact foods to avoid, if any. Fruits Any fruits prepared with added fat. Any fruits that cause symptoms. For some people, this may include citrus fruits, such as oranges, grapefruit, pineapple, and lemons. Vegetables Deep-fried vegetables. French fries. Any vegetables prepared with added fat. Any vegetables that cause symptoms. For some people, this may include tomatoes and tomato products, chili peppers, onions and garlic, and horseradish. Grains Pastries or quick breads with added fat. Meats and other proteins High-fat meats, such as fatty beef or pork,   hot dogs, ribs, ham, sausage, salami, and bacon. Fried meat or protein, including fried fish and fried chicken. Nuts and nut butters, in large amounts. Dairy Whole milk and chocolate milk. Sour cream. Cream. Ice cream. Cream cheese. Milkshakes. Fats and oils Butter. Margarine. Shortening. Ghee. Beverages Coffee and tea, with or without caffeine. Carbonated beverages. Sodas. Energy drinks. Fruit juice made with acidic fruits, such as orange or grapefruit. Tomato juice. Alcoholic drinks. Sweets and desserts Chocolate and  cocoa. Donuts. Seasonings and condiments Pepper. Peppermint and spearmint. Added salt. Any condiments, herbs, or seasonings that cause symptoms. For some people, this may include curry, hot sauce, or vinegar-based salad dressings. The items listed above may not be a complete list of what you should not eat and drink. Contact a food expert for more options. Questions to ask your doctor Diet and lifestyle changes are often the first steps that are taken to manage symptoms of GERD. If diet and lifestyle changes do not help, talk with your doctor about taking medicines. Where to find more information  International Foundation for Gastrointestinal Disorders: aboutgerd.org Summary  When you have GERD, food and lifestyle choices are very important in easing your symptoms.  Eat small meals often instead of 3 large meals a day. Eat your meals slowly and in a place where you are relaxed.  Avoid bending over or lying down until 2-3 hours after eating.  Limit high-fat foods such as fatty meats or fried foods. This information is not intended to replace advice given to you by your health care provider. Make sure you discuss any questions you have with your health care provider. Document Revised: 05/15/2020 Document Reviewed: 05/15/2020 Elsevier Patient Education  2021 Elsevier Inc.  

## 2021-02-13 NOTE — Assessment & Plan Note (Signed)
Recommend cutting back.  He refuses rehab or therapy.  Is aware of effect of heavy alcohol use on overall health, especially BP.

## 2021-02-13 NOTE — Assessment & Plan Note (Signed)
Chronic, ongoing with initial elevation and recheck some improvement, although still above goal.  Was seeing some benefit with Clonidine and tolerating, will restart this, was discontinued recently by neurology -- but has never started Nortriptyline.  His continued heavy alcohol use, continues to be a difficulty in maintaining and controlling BP level.   Continue current medication regimen and recommend cutting back on alcohol and smoking.  Continue collaboration with cardiology. CMP today.  Return in 6 weeks.

## 2021-02-13 NOTE — Assessment & Plan Note (Addendum)
Chronic, ongoing, continues to be a major issue and poorly controlled with Prilosec.  Had colonoscopy beginning of January, but no recent endoscopy.  Suspect major issue is heavy alcohol use, which may make control difficult.  Recommend major cut back on beer intake daily.  Will refer to GI for further guidance and recommendations in this more difficult case.  Labs today to include CMP, Amylase, Lipase, GGT.  Will also obtain CT scan abdomen to further assess ongoing pain.

## 2021-02-13 NOTE — Assessment & Plan Note (Signed)
Chronic, ongoing with poor control.  Is taking Finasteride and Flomax daily, has been on for long while with no benefit.  Did note enlargement on recent prostate exam.  Will obtain PSA today and refer to urology -- higher risk patient due to smoking and heavy alcohol use.

## 2021-02-13 NOTE — Assessment & Plan Note (Signed)
History of infarct, will place new referral to ENT as has ongoing tinnitus and dizziness.

## 2021-02-13 NOTE — Assessment & Plan Note (Signed)
Chronic, ongoing.  Continue current inhaler regimen at this time and recommend complete cessation of smoking.  Continue yearly lung screening.

## 2021-02-13 NOTE — Progress Notes (Signed)
BP (!) 148/92   Pulse 84   Temp 98.4 F (36.9 C) (Oral)   Wt 190 lb 9.6 oz (86.5 kg)   SpO2 97%   BMI 27.35 kg/m    Subjective:    Patient ID: Luke Briggs., male    DOB: 09-06-1961, 60 y.o.   MRN: [id number]  HPI: Luke Briggs. is a 59 y.o. male  Chief Complaint  Patient presents with  . Prostatitis     Patient states he did not complete course of treatment due to the medication making him vomit and diarrhea. Patient states he is still having issues.   . Tinnitus    Patient states he has had the ringing in his ears for the past year or so and no one can tell him what the issue is.   . Migraine    Patient states he takes a over the counter medication for it every morning.   . Emesis    Patient states he feels a constant burning sensation in the abdomen to the point he has to vomit.   [name] Hypertension    Patient states he is currently on BP medications and states he does not think the medication. Patient states his at home BP readings were always elevated.    ABDOMINAL PAIN  Follow-up for abdominal and flank pain today, seen last 01/10/21.  He wondered last visit if it was related to the fact he quit cold [place] taking his Suboxone 4 days prior to last visit on 01/10/21, was being followed by pain clinic for chronic pain and last seen one month ago per report.  He states he would rather be in pain then feel like he did and he is not returning.  Today reports epigastric pain, did have some diarrhea and darker stool recently with Bactrim use, but this has improved since stopping.  Notices epigastric pain all the time.  Taking Omeprazole daily.  Had colonoscopy on 12/15/20.  He reports throwing up every day due to heart burn. Duration:days Onset: gradual Severity: moderate Quality: dull and aching Location:  diffuse  Episode duration:  Radiation: no Frequency: intermittent Alleviating factors: unknown Aggravating factors: unknown Status: fluctuating Treatments  attempted: none Fever: recently with withdrawal symptoms Nausea: yes Vomiting: yes Weight loss: no Decreased appetite: yes Diarrhea: yes Constipation: no Blood in stool: no Heartburn: no Jaundice: no Rash: no Dysuria/urinary frequency: no Hematuria: no History of sexually transmitted disease: no Recurrent NSAID use: Excedrin migraine  BPH When has intercourse he does not ejaculate he reports and last time he did was green in color he reports. Last visit was treated with Bactrim due to concerns for prostatitis, but had to stop taking on 01/24/21 due to diarrhea.  Continues on Finasteride and Flomax.   BPH status: uncontrolled Satisfied with current treatment?: yes Medication side effects: no Medication compliance: good compliance Duration: chronic Nocturia: 5+ times per night Urinary frequency:yes Incomplete voiding: yes Urgency: yes Weak urinary stream: yes Straining to start stream: yes Dysuria: no Onset: sudden Severity: severe Alleviating factors: nothing Aggravating factors: unknown Treatments attempted: Finasteride and Flomax daily AUA symptom score:  IPSS Questionnaire (AUA-7): -- score today 30 Over the past month.   1)  How often have you had a sensation of not emptying your bladder completely after you finish urinating?  4 - More than half the time  2)  How often have you had to urinate again less than two hours after you finished urinating? 4 - More than  half the time  3)  How often have you found you stopped and started again several times when you urinated?  4 - More than half the time  4) How difficult have you found it to postpone urination?  4 - More than half the time  5) How often have you had a weak urinary stream?  4 - More than half the time  6) How often have you had to push or strain to begin urination?  5 - Almost always  7) How many times did you most typically get up to urinate from the time you went to bed until the time you got up in the morning?   5 - 5+ times  Total score:  0-7 mildly symptomatic   8-19 moderately symptomatic   20-35 severely symptomatic    COPD Uses Combivent, Stiolto, and Albuterol as needed.  Had last lung screening on 10/10/20 -- noting emphysema and aortic atherosclerosis. Satisfied with current treatment?: yes Oxygen use: no Dyspnea frequency: minimal Cough frequency: none Rescue inhaler frequency:   Limitation of activity: no Productive cough: none Last Spirometry: unknown Pneumovax: Not up to Date Influenza: Up to Date  HYPERTENSION Saw cardiology on 10/72021.  Continues onCardizem 120 MG BID. He reports up until 4 days ago was a case of beer a day for 6 months.  Continues to smoke 1/2 to 1 PPD.Not interested in quitting. Has underlying chronic right basal ganglia lacunar infarct on imaging and continues to complain of ongoing tinnitus and intermittent dizziness, would like to return to ENT.  Since last visit has seen cardiology on 01/11/21, vascular on 02/01/21, and neurology on 02/08/21.  Neurology decreased his Diltiazem ER to 120 MG BID, decrease Clonidine to 0.05 MG QHS, and added on Nortriptyline 20 MG QHS + started Baby ASA.  Vascular performed duplex ultrasound which showed widely patent RICA and 16% LICA stenosis + chronic right basal ganglia lacunar infarct, to return in 12 months.  Cardiology performed myoview and this showed no ischemia on stress test.  Had echo performed 11/18/19 -- EF 60-65% andno left ventricular hypertrophy.  Continues to drink Natural Light beer daily, about 10 beers a day, reports this is only thing that helps headache and ringing in ears.   Hypertension status:stable Satisfied with current treatment?yes Duration of hypertension:chronic BP monitoring frequency: not checking BP range:  BP medication side effects:no Medication compliance:good compliance Previous BP meds:has tried multiple medications and had ADR Aspirin:no Recurrent headaches:no Visual  changes:no Palpitations:no Dyspnea:no Chest pain:no Lower extremity edema:no Dizzy/lightheaded:no   MIGRAINES Saw neurology on 02/08/21.   Neurology decreased his Diltiazem ER to 120 MG BID, decrease Clonidine to 0.05 MG QHS, and added on Nortriptyline 20 MG QHS + started Baby ASA. Could not take Nortriptyline as it made him feel like a sloth, reports "will throw in trash before I take it".  Returns to see neurology on April 21st.   Duration: chronic Onset: gradual Severity: moderate Quality: dull and aching Frequency: intermittent Location: all over Headache duration: 24 to 48 hours Radiation: no Time of day headache occurs: varies Alleviating factors: Clonidine and beer Aggravating factors: unknown Headache status at time of visit: asymptomatic Treatments attempted: Treatments attempted: APAP and ibuprofen   Aura: no Nausea:  no Vomiting: no Photophobia:  no Phonophobia:  no Effect on social functioning:  no Numbers of missed days of school/work each month:  Confusion:  no Gait disturbance/ataxia:  no Behavioral changes:  no Fevers:  no  Relevant past medical, surgical, family  and social history reviewed and updated as indicated. Interim medical history since our last visit reviewed. Allergies and medications reviewed and updated.  Review of Systems  Constitutional: Positive for appetite change and chills (occasional with withdrawals). Negative for activity change, diaphoresis, fatigue and fever.  Respiratory: Negative for cough, chest tightness, shortness of breath and wheezing.   Cardiovascular: Negative for chest pain, palpitations and leg swelling.  Gastrointestinal: Positive for abdominal pain and nausea. Negative for abdominal distention, blood in stool, constipation, diarrhea and vomiting.  Endocrine: Negative for cold intolerance, heat intolerance, polydipsia, polyphagia and polyuria.  Genitourinary: Positive for flank pain and testicular pain. Negative for  decreased urine volume, dysuria, frequency, hematuria, penile discharge, penile pain, penile swelling, scrotal swelling and urgency.  Neurological: Positive for dizziness and weakness. Negative for syncope, light-headedness, numbness and headaches.  Psychiatric/Behavioral: Negative.     Per HPI unless specifically indicated above     Objective:    BP (!) 148/92   Pulse 84   Temp 98.4 F (36.9 C) (Oral)   Wt 190 lb 9.6 oz (86.5 kg)   SpO2 97%   BMI 27.35 kg/m   Wt Readings from Last 3 Encounters:  02/13/21 190 lb 9.6 oz (86.5 kg)  02/01/21 192 lb 9.6 oz (87.4 kg)  01/11/21 190 lb 2 oz (86.2 kg)    Physical Exam Vitals and nursing note reviewed.  Constitutional:      General: He is awake. He is not in acute distress.    Appearance: He is well-developed and well-groomed. He is not ill-appearing, toxic-appearing or diaphoretic.  HENT:     Head: Normocephalic and atraumatic.     Right Ear: Hearing normal. No drainage.     Left Ear: Hearing normal. No drainage.  Eyes:     General: Lids are normal.        Right eye: No discharge.        Left eye: No discharge.     Conjunctiva/sclera: Conjunctivae normal.     Pupils: Pupils are equal, round, and reactive to light.  Neck:     Thyroid: No thyromegaly.     Vascular: No carotid bruit.     Trachea: Trachea normal.  Cardiovascular:     Rate and Rhythm: Normal rate and regular rhythm.     Heart sounds: Normal heart sounds, S1 normal and S2 normal. No murmur heard. No gallop.   Pulmonary:     Effort: Pulmonary effort is normal. No accessory muscle usage or respiratory distress.     Breath sounds: Normal breath sounds.  Abdominal:     General: Bowel sounds are normal. There is no distension or abdominal bruit.     Palpations: Abdomen is soft. There is no hepatomegaly.     Tenderness: There is abdominal tenderness in the epigastric area. There is no right CVA tenderness, left CVA tenderness, guarding or rebound. Negative signs  include Murphy's sign.     Hernia: A hernia is present. Hernia is present in the umbilical area.  Musculoskeletal:        General: Normal range of motion.     Cervical back: Normal range of motion and neck supple.     Right lower leg: No edema.     Left lower leg: No edema.  Skin:    General: Skin is warm and dry.     Capillary Refill: Capillary refill takes less than 2 seconds.     Findings: No rash.  Neurological:     Mental Status: He is  alert and oriented to person, place, and time.     Cranial Nerves: Cranial nerves are intact.     Coordination: Coordination is intact.     Gait: Gait is intact.     Deep Tendon Reflexes: Reflexes are normal and symmetric.     Reflex Scores:      Brachioradialis reflexes are 2+ on the right side and 2+ on the left side.      Patellar reflexes are 2+ on the right side and 2+ on the left side. Psychiatric:        Attention and Perception: Attention normal.        Mood and Affect: Mood normal.        Speech: Speech normal.        Behavior: Behavior normal. Behavior is cooperative.        Thought Content: Thought content normal.    Results for orders placed or performed during the hospital encounter of 01/24/21  NM Myocar Multi W/Spect W/Wall Motion / EF  Result Value Ref Range   Rest HR 61 bpm   Rest BP 149/77 mmHg   Percent HR 73 %   Peak HR 120 bpm   Peak BP 161/87 mmHg   SSS 4    SRS 4    SDS 3    TID 1.21    LV sys vol 56 mL   LV dias vol 117 62 - 150 mL      Assessment & Plan:   Problem List Items Addressed This Visit      Cardiovascular and Mediastinum   Essential hypertension    Chronic, ongoing with initial elevation and recheck some improvement, although still above goal.  Was seeing some benefit with Clonidine and tolerating, will restart this, was discontinued recently by neurology -- but has never started Nortriptyline.  His continued heavy alcohol use, continues to be a difficulty in maintaining and controlling BP level.    Continue current medication regimen and recommend cutting back on alcohol and smoking.  Continue collaboration with cardiology. CMP today.  Return in 6 weeks.      Relevant Medications   cloNIDine (CATAPRES) 0.2 MG tablet   Migraine    Reports migraines for years and had some benefit with Clonidine.  Will restart this and stop Nortriptyline as never took this.  Continue to collaborate with neurology.  Clonidine may also offer benefit to BP and PTSD.  Recommend reduction of alcohol intake.  Return in 6 weeks.      Relevant Medications   cloNIDine (CATAPRES) 0.2 MG tablet     Respiratory   Paraseptal emphysema (HCC) - Primary    Chronic, ongoing.  Continue current inhaler regimen at this time and recommend complete cessation of smoking.  Continue yearly lung screening.        Digestive   GERD (gastroesophageal reflux disease)    Chronic, ongoing, continues to be a major issue and poorly controlled with Prilosec.  Had colonoscopy beginning of January, but no recent endoscopy.  Suspect major issue is heavy alcohol use, which may make control difficult.  Recommend major cut back on beer intake daily.  Will refer to GI for further guidance and recommendations in this more difficult case.  Labs today to include CMP, Amylase, Lipase, GGT.  Will also obtain CT scan abdomen to further assess ongoing pain.      Relevant Orders   Comprehensive metabolic panel   Amylase   Lipase   Gamma GT   Ambulatory referral to  Gastroenterology   CT Abdomen Pelvis Wo Contrast     Nervous and Auditory   Nicotine dependence, cigarettes, w unsp disorders    I have recommended complete cessation of tobacco use. I have discussed various options available for assistance with tobacco cessation including over the counter methods (Nicotine gum, patch and lozenges). We also discussed prescription options (Chantix, Nicotine Inhaler / Nasal Spray). The patient is not interested in pursuing any prescription tobacco  cessation options at this time.         Genitourinary   BPH (benign prostatic hyperplasia)    Chronic, ongoing with poor control.  Is taking Finasteride and Flomax daily, has been on for long while with no benefit.  Did note enlargement on recent prostate exam.  Will obtain PSA today and refer to urology -- higher risk patient due to smoking and heavy alcohol use.      Relevant Orders   Urinalysis, Routine w reflex microscopic   PSA   Ambulatory referral to Urology     Other   Alcohol dependence ([place])    Recommend cutting back.  He refuses rehab or therapy.  Is aware of effect of heavy alcohol use on overall health, especially BP.      History of lacunar cerebrovascular accident    History of infarct, will place new referral to ENT as has ongoing tinnitus and dizziness.        Relevant Orders   Ambulatory referral to ENT   Tinnitus of both ears    Referral sent to ENT for ongoing issues with tinnitus, suspect some related to heavy alcohol use.        Relevant Orders   Ambulatory referral to ENT       Follow up plan: Return in about 6 weeks (around 03/27/2021) for HTN, GERD, BPH.

## 2021-02-14 LAB — COMPREHENSIVE METABOLIC PANEL
ALT: 22 IU/L (ref 0–44)
AST: 25 IU/L (ref 0–40)
Albumin/Globulin Ratio: 1.3 (ref 1.2–2.2)
Albumin: 4.3 g/dL (ref 3.8–4.9)
Alkaline Phosphatase: 97 IU/L (ref 44–121)
BUN/Creatinine Ratio: 13 (ref 9–20)
BUN: 12 mg/dL (ref 6–24)
Bilirubin Total: 0.8 mg/dL (ref 0.0–1.2)
CO2: 21 mmol/L (ref 20–29)
Calcium: 9.4 mg/dL (ref 8.7–10.2)
Chloride: 98 mmol/L (ref 96–106)
Creatinine, Ser: 0.96 mg/dL (ref 0.76–1.27)
Globulin, Total: 3.2 g/dL (ref 1.5–4.5)
Glucose: 93 mg/dL (ref 65–99)
Potassium: 4.4 mmol/L (ref 3.5–5.2)
Sodium: 137 mmol/L (ref 134–144)
Total Protein: 7.5 g/dL (ref 6.0–8.5)
eGFR: 92 mL/min/{1.73_m2} (ref >59)

## 2021-02-14 LAB — AMYLASE: Amylase: 71 U/L (ref 31–110)

## 2021-02-14 LAB — LIPASE: Lipase: 25 U/L (ref 13–78)

## 2021-02-14 LAB — GAMMA GT: GGT: 64 IU/L (ref 0–65)

## 2021-02-14 LAB — PSA: Prostate Specific Ag, Serum: 0.3 ng/mL (ref 0.0–4.0)

## 2021-02-14 NOTE — Progress Notes (Signed)
Contacted via [place] morning Arhum, your labs have returned and everything is stable.  No changes needed at this time.  Any questions? Keep being awesome!!  Thank you for allowing me to participate in your care. Kindest regards, Savahna Casados

## 2021-02-22 ENCOUNTER — Ambulatory Visit (INDEPENDENT_AMBULATORY_CARE_PROVIDER_SITE_OTHER): Payer: Medicaid Other | Admitting: Urology

## 2021-02-22 ENCOUNTER — Encounter: Payer: Self-pay | Admitting: Urology

## 2021-02-22 ENCOUNTER — Other Ambulatory Visit: Payer: Self-pay

## 2021-02-22 ENCOUNTER — Ambulatory Visit (INDEPENDENT_AMBULATORY_CARE_PROVIDER_SITE_OTHER): Payer: Medicaid Other | Admitting: Cardiology

## 2021-02-22 ENCOUNTER — Encounter: Payer: Self-pay | Admitting: Cardiology

## 2021-02-22 VITALS — BP 174/95 | HR 75 | Ht 70.0 in | Wt 190.0 lb

## 2021-02-22 VITALS — BP 176/90 | HR 67 | Ht 70.0 in | Wt 190.0 lb

## 2021-02-22 DIAGNOSIS — I10 Essential (primary) hypertension: Secondary | ICD-10-CM | POA: Diagnosis not present

## 2021-02-22 DIAGNOSIS — R079 Chest pain, unspecified: Secondary | ICD-10-CM

## 2021-02-22 DIAGNOSIS — R3915 Urgency of urination: Secondary | ICD-10-CM | POA: Diagnosis not present

## 2021-02-22 DIAGNOSIS — F17200 Nicotine dependence, unspecified, uncomplicated: Secondary | ICD-10-CM

## 2021-02-22 DIAGNOSIS — N400 Enlarged prostate without lower urinary tract symptoms: Secondary | ICD-10-CM

## 2021-02-22 DIAGNOSIS — N401 Enlarged prostate with lower urinary tract symptoms: Secondary | ICD-10-CM | POA: Diagnosis not present

## 2021-02-22 DIAGNOSIS — R3912 Poor urinary stream: Secondary | ICD-10-CM | POA: Diagnosis not present

## 2021-02-22 DIAGNOSIS — R3911 Hesitancy of micturition: Secondary | ICD-10-CM | POA: Diagnosis not present

## 2021-02-22 LAB — URINALYSIS, COMPLETE
Bilirubin, UA: NEGATIVE
Glucose, UA: NEGATIVE
Ketones, UA: NEGATIVE
Leukocytes,UA: NEGATIVE
Nitrite, UA: NEGATIVE
Protein,UA: NEGATIVE
RBC, UA: NEGATIVE
Specific Gravity, UA: 1.015 (ref 1.005–1.030)
Urobilinogen, Ur: 0.2 mg/dL (ref 0.2–1.0)
pH, UA: 5.5 (ref 5.0–7.5)

## 2021-02-22 LAB — MICROSCOPIC EXAMINATION
Bacteria, UA: NONE SEEN
RBC, Urine: NONE SEEN /hpf (ref 0–2)
WBC, UA: NONE SEEN /hpf (ref 0–5)

## 2021-02-22 LAB — BLADDER SCAN AMB NON-IMAGING: Scan Result: 10

## 2021-02-22 MED ORDER — DILTIAZEM HCL ER COATED BEADS 360 MG PO CP24: 360.0000 mg | ORAL_CAPSULE | Freq: Every day | ORAL | 5 refills | Status: DC

## 2021-02-22 MED ORDER — IRBESARTAN 150 MG PO TABS: 150.0000 mg | ORAL_TABLET | Freq: Every day | ORAL | 5 refills | Status: DC

## 2021-02-22 MED ORDER — TADALAFIL 5 MG PO TABS: 5.0000 mg | ORAL_TABLET | Freq: Every day | ORAL | 0 refills | Status: DC | PRN

## 2021-02-22 NOTE — Progress Notes (Signed)
Cardiology Office Note:    Date:  02/22/2021   ID:  Luke Briggs., DOB July 01, 1961, MRN [id number]  PCP:  [name], NP  Cardiologist:  Kate Sable, MD  Electrophysiologist:  None   Referring MD: [name], NP   Chief Complaint  Patient presents with  . Other    1 month follow up. Meds reviewed verbally with patient.     History of Present Illness:    Luke Briggs. is a 60 y.o. 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    Patient being seen for hypertension and medication management. Management of hypertension has been challenging due to several adverse effects from antihypertensives as documented below. His BP was elevated with systolic in the 544B.  He still has dizziness and thinks his previous medication intolerances are not the cause.  He is worried about his elevated blood pressure and having strokes.  Is interested in restarting some of the previously not tolerating medicines.  Previously complained of chest pain, due to risk factors, Lexiscan Myoview was ordered.  Currently feels okay, asymptomatic.  Blood pressure still elevated.   Prior notes Patient has  poorly controlled blood pressures for years.  He has tried several medications but stopped due to adverse effects.  Lisinopril caused angioedema, carvedilol caused nausea/vomiting, amlodipine caused severe dizziness.    Patient has also tried chlorthalidone but was found to have hyponatremia and hypokalemia.  At the time, he was seen in emergency room for vomiting.  He was started on hydralazine but later on stopped because hydralazine causing shortness of breath, pounding in his chest. Clonidine cause ringing in his ears. Coreg also cause ringing in his ears.  He has been drinking heavily for the past 18 months after losing his fiance.  He drinks 10-15 beers daily.  He states  he has been trying to cut back.  He also is a current smoker for over 40 years.    Echocardiogram 10/2019 showed normal systolic and diastolic function.     Past Medical History:  Diagnosis Date  . Angioedema 08/07/2019  . Arthritis    knees,shoulders, ankles, most joints  . Cervical disc disorder    difficuly moving neck left  . COPD (chronic obstructive pulmonary disease) (HCC)    chronic cough and wheezing  . Dyspnea    easily  . GERD (gastroesophageal reflux disease)   . Hemorrhoids   . History of hiatal hernia   . Hypertension    controlled on meds  . Pneumonia 04/2019  . Prostate disorder   . PTSD (post-traumatic stress disorder)   . Schizophrenia ([place])    Per patient's report.    Past Surgical History:  Procedure Laterality Date  . CATARACT EXTRACTION W/PHACO Left 03/28/2020   Procedure: CATARACT EXTRACTION PHACO AND INTRAOCULAR LENS PLACEMENT ([place]) LEFT MALYUGIN  MILOOP,   5.29  00:41.0;  Surgeon: Birder Robson, MD;  Location: [facility];  Service: Ophthalmology;  Laterality: Left;  . COLONOSCOPY WITH PROPOFOL N/A 12/14/2020   Procedure: COLONOSCOPY WITH PROPOFOL;  Surgeon: Jonathon Bellows, MD;  Location: [facility] ENDOSCOPY;  Service: Gastroenterology;  Laterality: N/A;  . EYE SURGERY Left    injury from a nail  . FOOT SURGERY Left     Current Medications: Current Meds  Medication Sig  . albuterol (VENTOLIN HFA) 108 (90 Base) MCG/ACT inhaler Inhale 2 puffs into the lungs every 6 (  six) hours as needed for wheezing orshortness of breath.  [name] atorvastatin (LIPITOR) 10 MG tablet Take 1 tablet (10 mg total) by mouth daily.  . cloNIDine (CATAPRES) 0.2 MG tablet Take 1 tablet (0.2 mg total) by mouth at bedtime.  [name] diltiazem (CARDIZEM CD) 120 MG 24 hr capsule Take 1 capsule (120 mg total) by mouth 2 (two) times daily.  [name] diltiazem (CARDIZEM CD) 360 MG 24 hr capsule Take 1 capsule (360 mg total) by mouth daily.  [name] EPINEPHrine 0.3 mg/0.3 mL IJ SOAJ injection Inject 0.3  mLs (0.3 mg total) into the muscle as needed for anaphylaxis.  . finasteride (PROSCAR) 5 MG tablet Take 1 tablet (5 mg total) by mouth daily.  . hydrocortisone (ANUSOL-HC) 2.5 % rectal cream Place 1 application rectally 2 (two) times daily.  . Ipratropium-Albuterol (COMBIVENT) 20-100 MCG/ACT AERS respimat Inhale 1 puff into the lungs every 6 (six) hours.  . irbesartan (AVAPRO) 150 MG tablet Take 1 tablet (150 mg total) by mouth daily.  . montelukast (SINGULAIR) 10 MG tablet Take 1 tablet (10 mg total) by mouth at bedtime.  [name] omeprazole (PRILOSEC) 40 MG capsule Take 1 capsule (40 mg total) by mouth daily.  . sildenafil (VIAGRA) 50 MG tablet Take 1 tablet (50 mg total) by mouth daily as needed for erectile dysfunction.  [name] STIOLTO RESPIMAT 2.5-2.5 MCG/ACT AERS Inhale 2 puffs into the lungs daily.  . tadalafil (CIALIS) 5 MG tablet Take 1 tablet (5 mg total) by mouth daily as needed for erectile dysfunction.  . tamsulosin (FLOMAX) 0.4 MG CAPS capsule Take 1 capsule (0.4 mg total) by mouth daily.  [name] triamcinolone cream (KENALOG) 0.1 % Apply 1 application topically 2 (two) times daily.     Allergies:   Lisinopril, Amlodipine, and Penicillins   Social History   Socioeconomic History  . Marital status: Significant Other    Spouse name: Not on file  . Number of children: Not on file  . Years of education: Not on file  . Highest education level: Not on file  Occupational History  . Not on file  Tobacco Use  . Smoking status: Current Every Day Smoker    Packs/day: 1.00    Years: 43.00    Pack years: 43.00    Types: Cigarettes  . Smokeless tobacco: Never Used  Vaping Use  . Vaping Use: Never used  Substance and Sexual Activity  . Alcohol use: Yes    Alcohol/week: 8.0 standard drinks    Types: 8 Cans of beer per week    Comment: beer  . Drug use: Never  . Sexual activity: Not on file  Other Topics Concern  . Not on file  Social History Narrative   Patient has new girlfriend they have  been dating almost a year.    Social Determinants of Health   Financial Resource Strain: Not on file  Food Insecurity: Not on file  Transportation Needs: Not on file  Physical Activity: Not on file  Stress: Not on file  Social Connections: Not on file     Family History: The patient's family history includes Emphysema in his father and maternal grandmother; Heart disease in his father, maternal grandfather, mother, and sister; Osteoporosis in his mother and sister.  ROS:   Please see the history of present illness.     All other systems reviewed and are negative.  EKGs/Labs/Other Studies Reviewed:    The following studies were reviewed today:   EKG:  EKG not  ordered today.  Recent Labs: 01/10/2021: Hemoglobin 16.8; Platelets 292; TSH 1.500 02/13/2021: ALT 22; BUN 12; Creatinine, Ser 0.96; Potassium 4.4; Sodium 137  Recent Lipid Panel    Component Value Date/Time   CHOL 214 (H) 01/10/2021 1502   CHOL 171 03/22/2020 1128   TRIG 134 01/10/2021 1502   TRIG 173 (H) 03/22/2020 1128   HDL 55 01/10/2021 1502   VLDL 35 (H) 03/22/2020 1128   LDLCALC 135 (H) 01/10/2021 1502    Physical Exam:    VS:  BP (!) 176/90 (BP Location: Left Arm, Patient Position: Sitting, Cuff Size: Normal)   Pulse 67   Ht 5\' 10"  (1.778 m)   Wt 190 lb (86.2 kg)   SpO2 99%   BMI 27.26 kg/m     Wt Readings from Last 3 Encounters:  02/22/21 190 lb (86.2 kg)  02/22/21 190 lb (86.2 kg)  02/13/21 190 lb 9.6 oz (86.5 kg)     GEN:  Well nourished, well developed in no acute distress,  HEENT: Normal NECK: No JVD; No carotid bruits LYMPHATICS: No lymphadenopathy CARDIAC: RRR, no murmurs, rubs, gallops RESPIRATORY: Decreased breath sounds ABDOMEN: Soft, non-tender, non-distended MUSCULOSKELETAL:  No edema; No deformity  SKIN: Warm and dry NEUROLOGIC:  Alert and oriented x 3 PSYCHIATRIC:  Normal affect   ASSESSMENT:    1. Chest pain of uncertain etiology   2. Primary hypertension   3. Smoking     PLAN:    In order of problems listed above:  1. Patient with chest pressure and diaphoresis.  Has risk factors of hypertension, current smoker.  echocardiogram on 08/2019 showed normal systolic function EF 60 to 65%.  Lexiscan Myoview with no evidence for ischemia.  Smoking cessation, reduction of alcohol intake advised. 2. Blood pressure still elevated, increase Cardizem to 360 mg 1 tab daily, start irbesartan 150 mg daily, continue clonidine. Plan to Increase irbesartan to 300 mg at follow-up visit if BP still elevated. 3. Current smoker.  smoking cessation advised.  Follow-up 1 month for BP and medication titration.    Medication Adjustments/Labs and Tests Ordered: Current medicines are reviewed at length with the patient today.  Concerns regarding medicines are outlined above.  No orders of the defined types were placed in this encounter.  Meds ordered this encounter  Medications  . diltiazem (CARDIZEM CD) 360 MG 24 hr capsule    Sig: Take 1 capsule (360 mg total) by mouth daily.    Dispense:  30 capsule    Refill:  5  . irbesartan (AVAPRO) 150 MG tablet    Sig: Take 1 tablet (150 mg total) by mouth daily.    Dispense:  30 tablet    Refill:  5    Patient Instructions  Medication Instructions:  Your physician has recommended you make the following change in your medication:   1.  INCREASE your Diltiazem (Cardizem CD) to 360 MG once daily.  2.  START taking Irbesartan (Avapro) 150 MG once daily.   *If you need a refill on your cardiac medications before your next appointment, please call your pharmacy*   Lab Work: None ordered If you have labs (blood work) drawn today and your tests are completely normal, you will receive your results only by: [name] MyChart Message (if you have MyChart) OR . A paper copy in the mail If you have any lab test that is abnormal or we need to change your treatment, we will call you to review the results.   Testing/Procedures: None  ordered  Follow-Up: At [facility], you and your health needs are our priority.  As part of our continuing mission to provide you with exceptional heart care, we have created designated Provider Care Teams.  These Care Teams include your primary Cardiologist (physician) and Advanced Practice Providers (APPs -  Physician Assistants and Nurse Practitioners) who all work together to provide you with the care you need, when you need it.  We recommend signing up for the patient portal called "MyChart".  Sign up information is provided on this After Visit Summary.  MyChart is used to connect with patients for Virtual Visits (Telemedicine).  Patients are able to view lab/test results, encounter notes, upcoming appointments, etc.  Non-urgent messages can be sent to your provider as well.   To learn more about what you can do with MyChart, go to [email or web address].    Your next appointment:   1 month(s)  The format for your next appointment:   In Person  Provider:   Kate Sable, MD   Other Instructions      Signed, Kate Sable, MD  02/22/2021 12:48 PM    [place]

## 2021-02-22 NOTE — Progress Notes (Signed)
02/22/2021 8:22 AM   Luke Briggs. 1960/12/14 [id number]  Referring provider: [name], NP [address] [place],  [place] [address]  Chief Complaint  Patient presents with  . Benign Prostatic Hypertrophy    HPI: Luke Briggs, Luke Briggs. is a 60 y.o. male referred for BPH with lower urinary tract symptoms.   Long history BPH with voiding symptoms  Symptoms worse the last 12 months and he complains of sensation incomplete emptying, urinary frequency, intermittent urinary stream, urgency, weak urinary stream, straining to urinate and nocturia x5  IPSS 34/35 with bother rated 6/6  Has been on tamsulosin and finasteride 8+ years  Denies dysuria, gross hematuria  Denies flank, abdominal or pelvic pain  + ED on sildenafil  PSA 02/13/2021 was 0.3     PMH: Past Medical History:  Diagnosis Date  . Angioedema 08/07/2019  . Arthritis    knees,shoulders, ankles, most joints  . Cervical disc disorder    difficuly moving neck left  . COPD (chronic obstructive pulmonary disease) (HCC)    chronic cough and wheezing  . Dyspnea    easily  . GERD (gastroesophageal reflux disease)   . Hemorrhoids   . History of hiatal hernia   . Hypertension    controlled on meds  . Pneumonia 04/2019  . Prostate disorder   . PTSD (post-traumatic stress disorder)   . Schizophrenia ([place])    Per patient's report.    Surgical History: Past Surgical History:  Procedure Laterality Date  . CATARACT EXTRACTION W/PHACO Left 03/28/2020   Procedure: CATARACT EXTRACTION PHACO AND INTRAOCULAR LENS PLACEMENT ([place]) LEFT MALYUGIN  MILOOP,   5.29  00:41.0;  Surgeon: Luke Robson, MD;  Location: [facility];  Service: Ophthalmology;  Laterality: Left;  . COLONOSCOPY WITH PROPOFOL N/A 12/14/2020   Procedure: COLONOSCOPY WITH PROPOFOL;  Surgeon: Luke Bellows, MD;  Location: [facility] ENDOSCOPY;  Service: Gastroenterology;  Laterality: N/A;  . EYE SURGERY Left    injury from a nail  . FOOT  SURGERY Left     Home Medications:  Allergies as of 02/22/2021      Reactions   Lisinopril Swelling   Reaction: angioedema   Amlodipine    Dizziness and edema with this   Penicillins Rash   Reaction: Feels like skin is on fire Did it involve swelling of the face/tongue/throat, SOB, or low BP? No Did it involve sudden or severe rash/hives, skin peeling, or any reaction on the inside of your mouth or nose? No Did you need to seek medical attention at a hospital or doctor's office? Yes When did it last happen?30 years ago If all above answers are "NO", may proceed with cephalosporin use.      Medication List       Accurate as of February 22, 2021  8:22 AM. If you have any questions, ask your nurse or doctor.        STOP taking these medications   promethazine 12.5 MG tablet Commonly known as: PHENERGAN Stopped by: Abbie Sons, MD     TAKE these medications   albuterol 108 (90 Base) MCG/ACT inhaler Commonly known as: VENTOLIN HFA Inhale 2 puffs into the lungs every 6 (six) hours as needed for wheezing orshortness of breath.   atorvastatin 10 MG tablet Commonly known as: LIPITOR Take 1 tablet (10 mg total) by mouth daily.   cloNIDine 0.2 MG tablet Commonly known as: Catapres Take 1 tablet (0.2 mg total) by mouth at bedtime.   diltiazem 120  MG 24 hr capsule Commonly known as: CARDIZEM CD Take 1 capsule (120 mg total) by mouth 2 (two) times daily.   EPINEPHrine 0.3 mg/0.3 mL Soaj injection Commonly known as: EPI-PEN Inject 0.3 mLs (0.3 mg total) into the muscle as needed for anaphylaxis.   finasteride 5 MG tablet Commonly known as: PROSCAR Take 1 tablet (5 mg total) by mouth daily.   hydrocortisone 2.5 % rectal cream Commonly known as: ANUSOL-HC Place 1 application rectally 2 (two) times daily. What changed:   when to take this  reasons to take this   Ipratropium-Albuterol 20-100 MCG/ACT Aers respimat Commonly known as: COMBIVENT Inhale 1 puff into the  lungs every 6 (six) hours.   montelukast 10 MG tablet Commonly known as: SINGULAIR Take 1 tablet (10 mg total) by mouth at bedtime.   omeprazole 40 MG capsule Commonly known as: PRILOSEC Take 1 capsule (40 mg total) by mouth daily.   sildenafil 50 MG tablet Commonly known as: VIAGRA Take 1 tablet (50 mg total) by mouth daily as needed for erectile dysfunction.   Stiolto Respimat 2.5-2.5 MCG/ACT Aers Generic drug: Tiotropium Bromide-Olodaterol Inhale 2 puffs into the lungs daily.   tamsulosin 0.4 MG Caps capsule Commonly known as: FLOMAX Take 1 capsule (0.4 mg total) by mouth daily.   triamcinolone 0.1 % Commonly known as: KENALOG Apply 1 application topically 2 (two) times daily.       Allergies:  Allergies  Allergen Reactions  . Lisinopril Swelling    Reaction: angioedema  . Amlodipine     Dizziness and edema with this  . Penicillins Rash    Reaction: Feels like skin is on fire Did it involve swelling of the face/tongue/throat, SOB, or low BP? No Did it involve sudden or severe rash/hives, skin peeling, or any reaction on the inside of your mouth or nose? No Did you need to seek medical attention at a hospital or doctor's office? Yes When did it last happen?30 years ago If all above answers are "NO", may proceed with cephalosporin use.     Family History: Family History  Problem Relation Age of Onset  . Heart disease Mother   . Osteoporosis Mother   . Heart disease Father   . Emphysema Father   . Heart disease Sister   . Emphysema Maternal Grandmother   . Heart disease Maternal Grandfather   . Osteoporosis Sister     Social History:  reports that he has been smoking cigarettes. He has a 43.00 pack-year smoking history. He has never used smokeless tobacco. He reports current alcohol use of about 8.0 standard drinks of alcohol per week. He reports that he does not use drugs.   Physical Exam: BP (!) 174/95   Pulse 75   Ht 5\' 10"  (1.778 m)   Wt 190 lb  (86.2 kg)   BMI 27.26 kg/m   Constitutional:  Alert and oriented, No acute distress. HEENT: [place] AT, moist mucus membranes.  Trachea midline, no masses. Cardiovascular: No clubbing, cyanosis, or edema. Respiratory: Normal respiratory effort, no increased work of breathing. GU: Prostate 50 g, smooth without nodules Skin: No rashes, bruises or suspicious lesions. Neurologic: Grossly intact, no focal deficits, moving all 4 extremities. Psychiatric: Normal mood and affect.  Laboratory Data:  Urinalysis Dipstick/microscopy negative  Assessment & Plan:    1. Benign prostatic hyperplasia with severe LUTS  Bladder scan PVR 10 mL  Combination therapy ~ 8 years with persistent LUTS  Surgical options were discussed including TURP, HoLEP for a larger  glans and minimally invasive options including UroLift  He does not want surgery unless absolutely necessary and since PVR is minimal he was informed surgery is just an option  He does have ED and we did discuss that low-dose tadalafil was approved for BPH in ED and he was interested in a trial  Rx tadalafil 5 mg daily sent to pharmacy  He does not feel tamsulosin has been of benefit and will discontinue however he was informed to restart if he does note worsening symptoms off this medication  He declined a follow-up appointment and indicated he would follow-up as needed   Abbie Sons, MD  [facility] [address] [place], [place] [address] [phone]

## 2021-02-22 NOTE — Patient Instructions (Signed)
Medication Instructions:  Your physician has recommended you make the following change in your medication:   1.  INCREASE your Diltiazem (Cardizem CD) to 360 MG once daily.  2.  START taking Irbesartan (Avapro) 150 MG once daily.   *If you need a refill on your cardiac medications before your next appointment, please call your pharmacy*   Lab Work: None ordered If you have labs (blood work) drawn today and your tests are completely normal, you will receive your results only by: [name] MyChart Message (if you have MyChart) OR . A paper copy in the mail If you have any lab test that is abnormal or we need to change your treatment, we will call you to review the results.   Testing/Procedures: None ordered   Follow-Up: At [facility], you and your health needs are our priority.  As part of our continuing mission to provide you with exceptional heart care, we have created designated Provider Care Teams.  These Care Teams include your primary Cardiologist (physician) and Advanced Practice Providers (APPs -  Physician Assistants and Nurse Practitioners) who all work together to provide you with the care you need, when you need it.  We recommend signing up for the patient portal called "MyChart".  Sign up information is provided on this After Visit Summary.  MyChart is used to connect with patients for Virtual Visits (Telemedicine).  Patients are able to view lab/test results, encounter notes, upcoming appointments, etc.  Non-urgent messages can be sent to your provider as well.   To learn more about what you can do with MyChart, go to [email or web address].    Your next appointment:   1 month(s)  The format for your next appointment:   In Person  Provider:   Kate Sable, MD   Other Instructions

## 2021-03-05 ENCOUNTER — Ambulatory Visit
Admission: RE | Admit: 2021-03-05 | Discharge: 2021-03-05 | Disposition: A | Discharge status: Home / Self Care | Payer: Medicaid Other | Source: Ambulatory Visit | Attending: Nurse Practitioner | Admitting: Nurse Practitioner

## 2021-03-05 ENCOUNTER — Other Ambulatory Visit: Payer: Self-pay

## 2021-03-05 DIAGNOSIS — K219 Gastro-esophageal reflux disease without esophagitis: Secondary | ICD-10-CM | POA: Diagnosis not present

## 2021-03-05 IMAGING — CT CT ABD-PELV W/O CM
2 of 4 series · 16 of 46 positions shown, 18 images · non-contrast
Comparison: None.

CLINICAL DATA: Loss of appetite and early satiety x1 year

EXAM:
CT ABDOMEN AND PELVIS WITHOUT CONTRAST
TECHNIQUE: Multidetector CT imaging of the abdomen and pelvis was performed
following the standard protocol without IV contrast.

[Series 2: axials routine abdomen pelvis without 5.00 · axial · non-contrast · 0.80mm/px · z∈[-1578,-1138]mm · 13 of 96 slices shown, 15 images]
[im 4/96  soft-tissue]
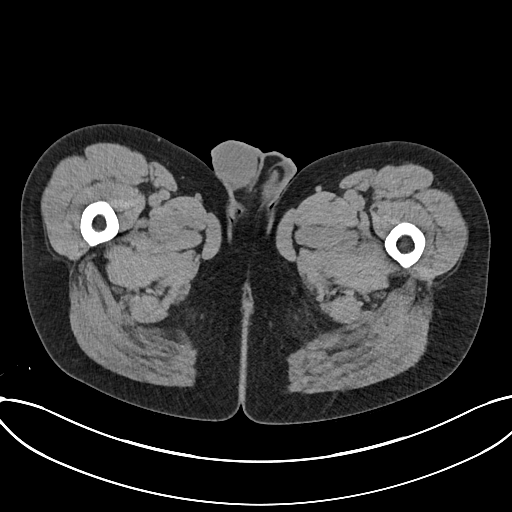
[im 4/96  bone]
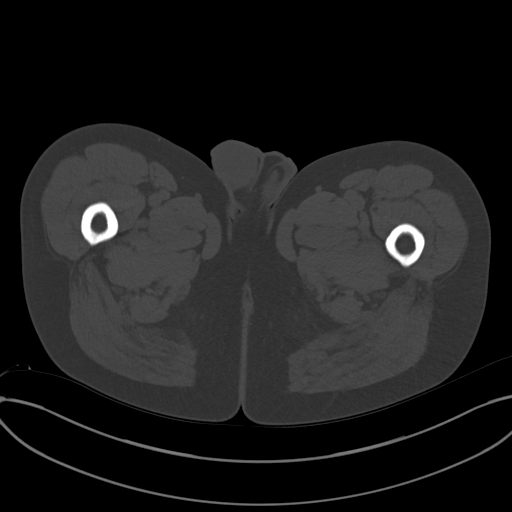
[im 12/96  soft-tissue]
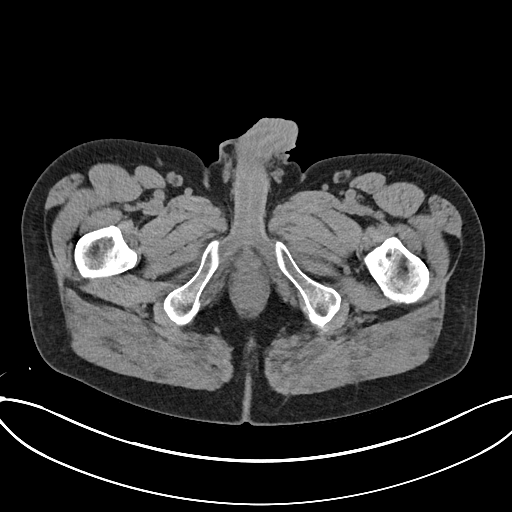
[im 20/96  soft-tissue]
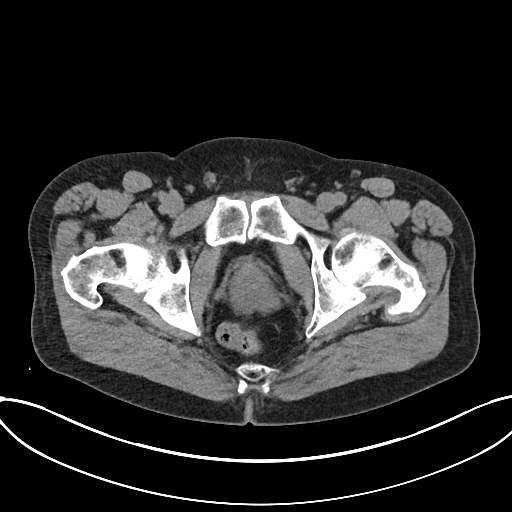
[im 28/96  soft-tissue]
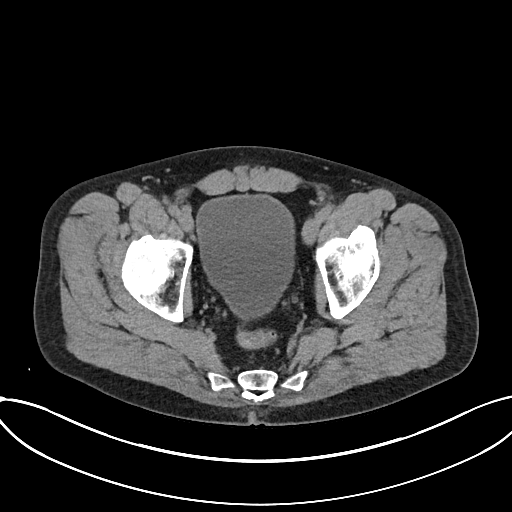
[im 32/96  soft-tissue]
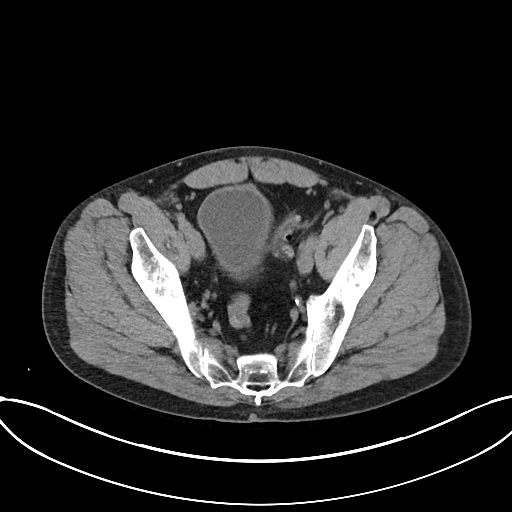
[im 40/96  soft-tissue]
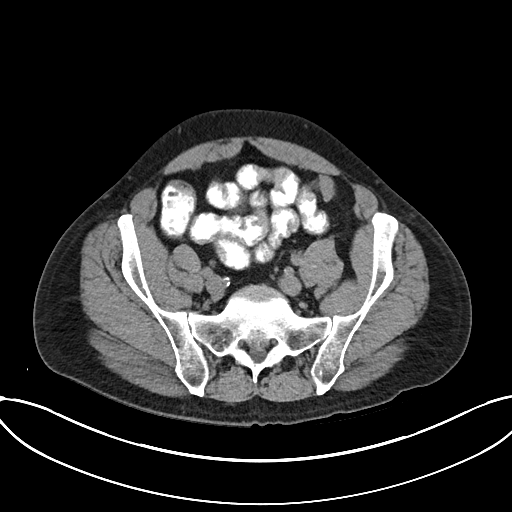
[im 48/96  soft-tissue]
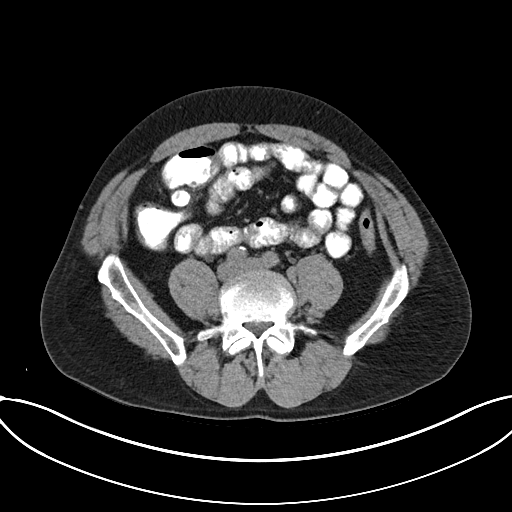
[im 56/96  soft-tissue]
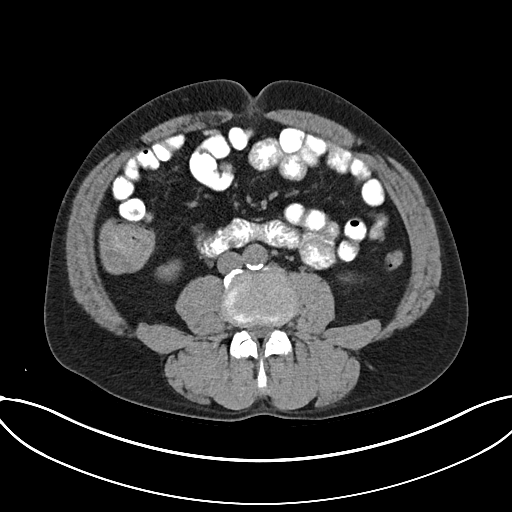
[im 64/96  soft-tissue]
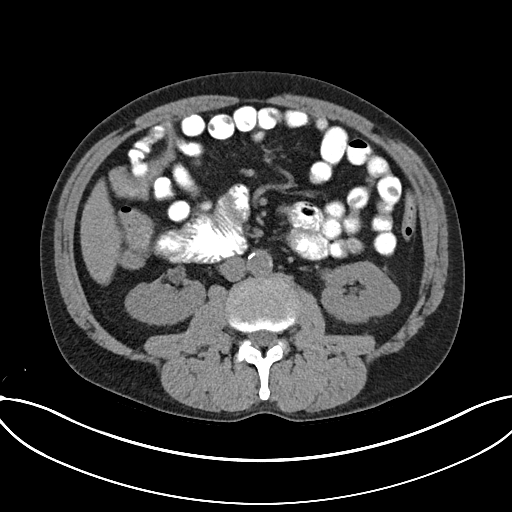
[im 64/96  bone]
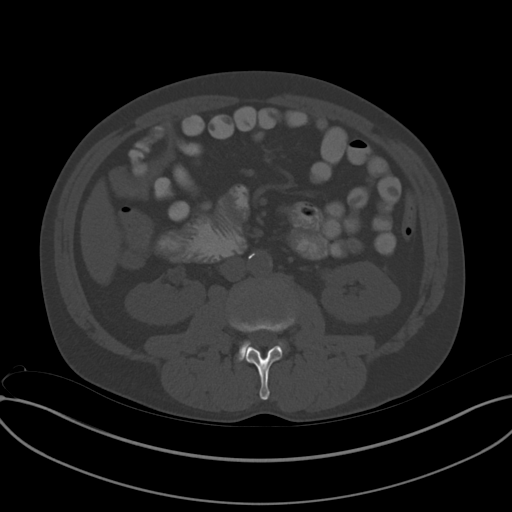
[im 68/96  soft-tissue]
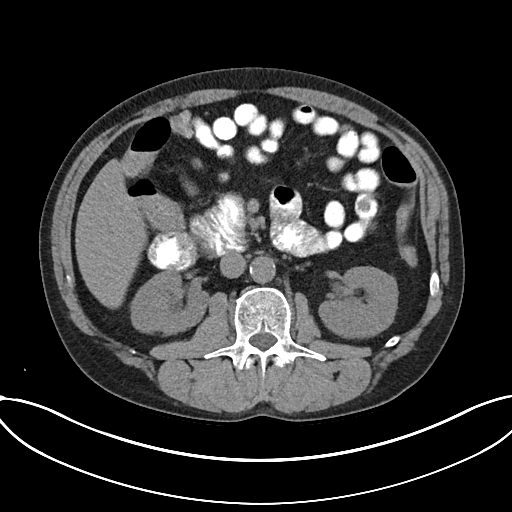
[im 76/96  soft-tissue]
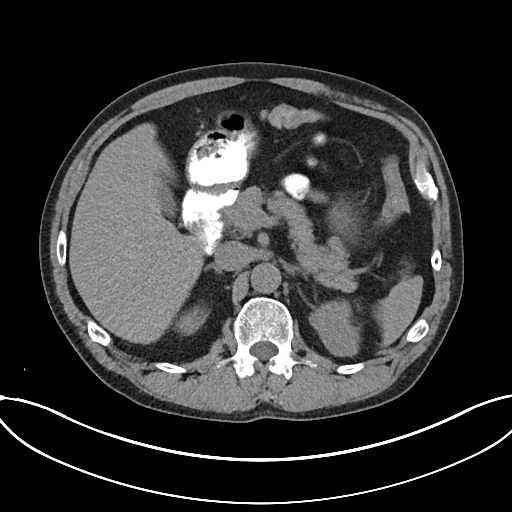
[im 84/96  soft-tissue]
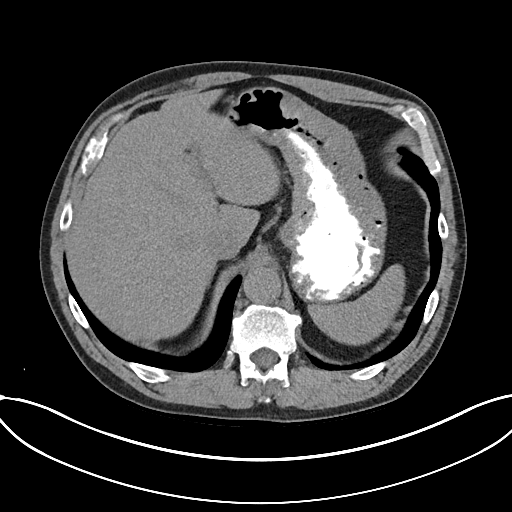
[im 92/96  soft-tissue]
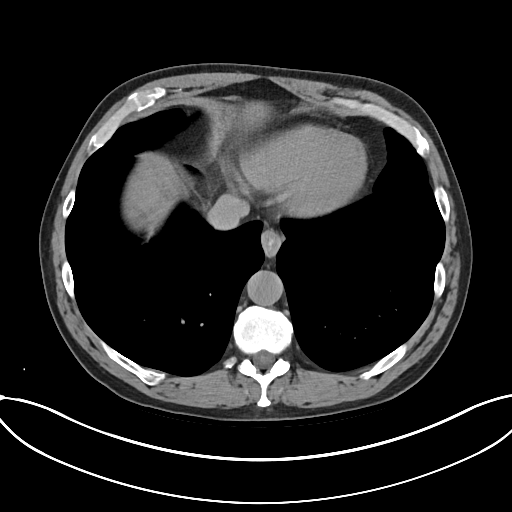

[Series 4: coronals routine abdomen pelvis without 2.00 cor · coronal · non-contrast · 0.77mm/px · 3 of 147 slices shown]
[im 49/147  soft-tissue]
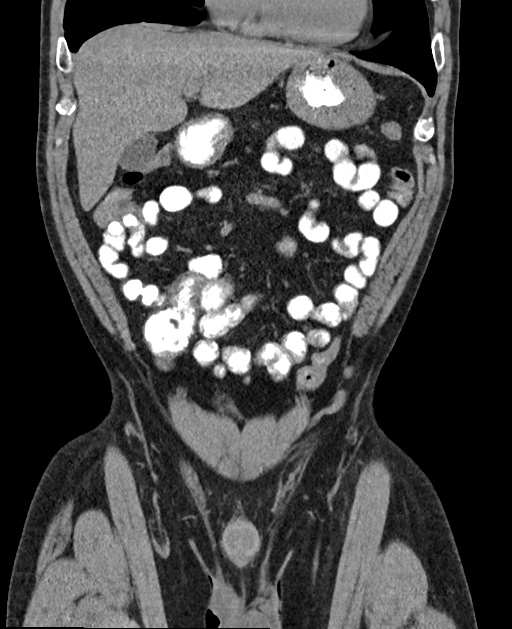
[im 65/147  soft-tissue]
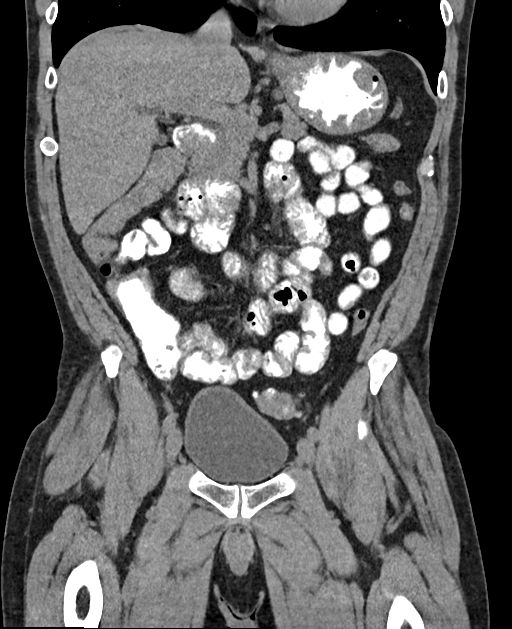
[im 82/147  soft-tissue]
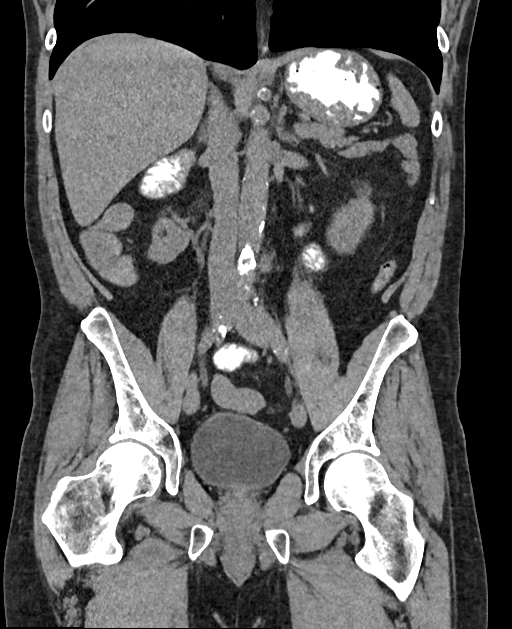

[16 of 46 positions shown; findings below may reference images not displayed]

FINDINGS: Lower chest: No acute abnormality. Normal size heart. No significant
pericardial effusion/thickening.

Hepatobiliary: Unremarkable noncontrast appearance of the hepatic
parenchyma. Gallbladder is grossly unremarkable. No biliary ductal
dilation.

Pancreas: Unremarkable noncontrast appearance.

Spleen: Within normal limits.

Adrenals/Urinary Tract: Bilateral adrenal glands are unremarkable.

No hydronephrosis. No contour deforming renal masses. No
nephrolithiasis.

Urinary bladder is grossly unremarkable for degree of distension.

Stomach/Bowel: Orally ingested enteric contrast traverses the cecum.
Stomach is grossly unremarkable for degree of distension. No
suspicious small bowel wall thickening or dilation. Appendix is not
definitely visualized however there is no pericecal inflammation.
Left-sided colonic diverticulosis without findings of acute
diverticulitis.

Vascular/Lymphatic: Aortic atherosclerosis. No enlarged abdominal or
pelvic lymph nodes.

Reproductive: Prostate is unremarkable.

Other: No abdominopelvic ascites.

Musculoskeletal: Multilevel degenerative change of the spine. No
aggressive lytic or blastic lesion of bone.
IMPRESSION: 1. No acute abdominopelvic findings.
2. Left-sided colonic diverticulosis without findings of acute
diverticulitis.
3. Aortic atherosclerosis.

Aortic Atherosclerosis ([6T]-[6T]).

## 2021-03-05 NOTE — Progress Notes (Signed)
Contacted via [place] afternoon [place], your CT scan returned and showed no abnormalities to explain ongoing abdominal discomfort.  No cancerous findings.  Suspect much related to your alcohol intake and recommend continue to cut back on this and we will see what the GI provider recommends for this ongoing discomfort.  Any questions? Keep being awesome!!  Thank you for allowing me to participate in your care. Kindest regards, Deontez Klinke

## 2021-03-08 ENCOUNTER — Telehealth: Payer: Self-pay | Admitting: Nurse Practitioner

## 2021-03-08 NOTE — Telephone Encounter (Signed)
Called and notified [facility] of Luke Briggs's message.

## 2021-03-08 NOTE — Telephone Encounter (Signed)
[facility] called to inform the doctor that they would recommend a referral be requested for Dr. Smith Mince at [facility] to review the patient's BP.  Please advise and call if there are any questions at [phone]

## 2021-03-27 ENCOUNTER — Other Ambulatory Visit: Payer: Self-pay

## 2021-03-27 ENCOUNTER — Ambulatory Visit (INDEPENDENT_AMBULATORY_CARE_PROVIDER_SITE_OTHER): Payer: Medicaid Other | Admitting: Nurse Practitioner

## 2021-03-27 ENCOUNTER — Encounter: Payer: Self-pay | Admitting: Nurse Practitioner

## 2021-03-27 VITALS — BP 133/83 | HR 76 | Temp 98.0°F | Wt 190.2 lb

## 2021-03-27 DIAGNOSIS — F209 Schizophrenia, unspecified: Secondary | ICD-10-CM | POA: Diagnosis not present

## 2021-03-27 DIAGNOSIS — I700 Atherosclerosis of aorta: Secondary | ICD-10-CM | POA: Diagnosis not present

## 2021-03-27 DIAGNOSIS — J438 Other emphysema: Secondary | ICD-10-CM

## 2021-03-27 DIAGNOSIS — N401 Enlarged prostate with lower urinary tract symptoms: Secondary | ICD-10-CM

## 2021-03-27 DIAGNOSIS — F1123 Opioid dependence with withdrawal: Secondary | ICD-10-CM

## 2021-03-27 DIAGNOSIS — F1193 Opioid use, unspecified with withdrawal: Secondary | ICD-10-CM

## 2021-03-27 DIAGNOSIS — K219 Gastro-esophageal reflux disease without esophagitis: Secondary | ICD-10-CM

## 2021-03-27 DIAGNOSIS — E782 Mixed hyperlipidemia: Secondary | ICD-10-CM | POA: Insufficient documentation

## 2021-03-27 DIAGNOSIS — F10288 Alcohol dependence with other alcohol-induced disorder: Secondary | ICD-10-CM

## 2021-03-27 DIAGNOSIS — F17219 Nicotine dependence, cigarettes, with unspecified nicotine-induced disorders: Secondary | ICD-10-CM

## 2021-03-27 DIAGNOSIS — H9313 Tinnitus, bilateral: Secondary | ICD-10-CM

## 2021-03-27 DIAGNOSIS — I10 Essential (primary) hypertension: Secondary | ICD-10-CM

## 2021-03-27 DIAGNOSIS — R351 Nocturia: Secondary | ICD-10-CM

## 2021-03-27 MED ORDER — OMEPRAZOLE 40 MG PO CPDR: 40.0000 mg | DELAYED_RELEASE_CAPSULE | Freq: Every day | ORAL | 4 refills | Status: DC

## 2021-03-27 MED ORDER — FINASTERIDE 5 MG PO TABS: 5.0000 mg | ORAL_TABLET | Freq: Every day | ORAL | 4 refills | Status: DC

## 2021-03-27 MED ORDER — STIOLTO RESPIMAT 2.5-2.5 MCG/ACT IN AERS: 2.0000 | INHALATION_SPRAY | Freq: Every day | RESPIRATORY_TRACT | 4 refills | Status: DC

## 2021-03-27 MED ORDER — IPRATROPIUM-ALBUTEROL 20-100 MCG/ACT IN AERS: 1.0000 | INHALATION_SPRAY | Freq: Four times a day (QID) | RESPIRATORY_TRACT | 4 refills | Status: DC

## 2021-03-27 MED ORDER — ATORVASTATIN CALCIUM 10 MG PO TABS: 10.0000 mg | ORAL_TABLET | Freq: Every day | ORAL | 4 refills | Status: DC

## 2021-03-27 MED ORDER — MONTELUKAST SODIUM 10 MG PO TABS: 10.0000 mg | ORAL_TABLET | Freq: Every day | ORAL | 4 refills | Status: DC

## 2021-03-27 MED ORDER — TAMSULOSIN HCL 0.4 MG PO CAPS: 0.4000 mg | ORAL_CAPSULE | Freq: Every day | ORAL | 4 refills | Status: DC

## 2021-03-27 MED ORDER — ALBUTEROL SULFATE HFA 108 (90 BASE) MCG/ACT IN AERS: INHALATION_SPRAY | RESPIRATORY_TRACT | 5 refills | Status: DC

## 2021-03-27 MED ORDER — TRIAMCINOLONE ACETONIDE 0.1 % EX CREA: 1.0000 "application " | TOPICAL_CREAM | Freq: Two times a day (BID) | CUTANEOUS | 4 refills | Status: DC

## 2021-03-27 NOTE — Progress Notes (Signed)
BP 133/83 (BP Location: Left Wrist, Cuff Size: Large)   Pulse 76   Temp 98 F (36.7 C) (Oral)   Wt 190 lb 3.2 oz (86.3 kg)   SpO2 99%   BMI 27.29 kg/m    Subjective:    Patient ID: Luke Briggs., male    DOB: August 29, 1961, 60 y.o.   MRN: [id number]  HPI: Luke Briggs. is a 60 y.o. male  Chief Complaint  Patient presents with  . Hypertension    Patient states he has been off his BP medications for 3 days due to numbness in his lower extremities and the left side of his body. Patient states the numbness lasted for a little bit and it has gotten better. Patient is requesting a referral to specialist for his blood pressure.   . Gastroesophageal Reflux  . Benign Prostatic Hypertrophy  . Medication Refill    Patient is requesting refills on medications.   BPH Continues on Finasteride and Flomax.  Recently saw urology and discussed surgical options which he does not want.  Started on Tadalafil as needed for ED.  BPH status: uncontrolled Satisfied with current treatment?: yes Medication side effects: no Medication compliance: good compliance Duration: chronic Nocturia: 5+ times per night Urinary frequency:yes Incomplete voiding: yes Urgency: yes Weak urinary stream: yes Straining to start stream: yes Dysuria: no Onset: sudden Severity: severe Alleviating factors: nothing Aggravating factors: unknown Treatments attempted: Finasteride and Flomax daily AUA symptom score: 30  COPD Uses Combivent, Stiolto, and Albuterol as needed.  Had last lung screening on 10/10/20 -- noting emphysema and aortic atherosclerosis.  Continues on Omeprazole for GERD and is tolerating this well. Satisfied with current treatment?: yes Oxygen use: no Dyspnea frequency: minimal Cough frequency: none Rescue inhaler frequency:   Limitation of activity: no Productive cough: none Last Spirometry: unknown Pneumovax: Not up to Date Influenza: Up to Date  HYPERTENSION Saw cardiology  on 02/22/21 -- increased Cardizem to 360 MG and added Irbesartan 150 MG daily -- states this caused more side effects -- taking only Clonidine at this time (uses for mood too).  Has not taken BP medications in 3 days due to numbness and reports this has improved without medication. Continues to drink about 15-24 Natural Light beer a day -- would do rehab, but does not want to lose SSI as would be homeless. He reports this makes his ear ringing go away. Continues to smoke 1/2 to 1 PPD.Not interested in quitting.  Denies any return to opioid use.  Has underlying chronic right basal ganglia lacunar infarct on imaging and continues to complain of ongoing tinnitus and intermittent dizziness, would like to return to ENT -- wishes referral to return.  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  Cardiology performed myoview and this showed no ischemia on stress test.  Had echo performed 11/18/19 -- EF 60-65% andno left ventricular hypertrophy.   Hypertension status:stable Satisfied with current treatment?yes Duration of hypertension:chronic BP monitoring frequency: not checking BP range:  BP medication side effects:no Medication compliance:good compliance Previous BP meds:has tried multiple medications and had ADR Aspirin:no Recurrent headaches:no Visual changes:no Palpitations:no Dyspnea:no Chest pain:no Lower extremity edema:no Dizzy/lightheaded:no   MIGRAINES Saw neurology on 03/08/21.  Started on Meclizine and recommended a referral to Dr. Smith Mince at [facility] for BP. Duration: chronic Onset: gradual Severity: moderate Quality: dull and aching Frequency: intermittent Location: all over Headache  duration: 24 to 48 hours Radiation: no Time of day headache occurs: varies Alleviating factors: Clonidine and beer Aggravating factors: unknown Headache status at  time of visit: asymptomatic Treatments attempted: Treatments attempted: APAP and ibuprofen   Aura: no Nausea:  no Vomiting: no Photophobia:  no Phonophobia:  no Effect on social functioning:  no Numbers of missed days of school/work each month:  Confusion:  no Gait disturbance/ataxia:  no Behavioral changes:  no Fevers:  no  Relevant past medical, surgical, family and social history reviewed and updated as indicated. Interim medical history since our last visit reviewed. Allergies and medications reviewed and updated.  Review of Systems  Constitutional: Negative for activity change, appetite change, chills, diaphoresis, fatigue and fever.  Respiratory: Negative for cough, chest tightness, shortness of breath and wheezing.   Cardiovascular: Negative for chest pain, palpitations and leg swelling.  Gastrointestinal: Negative.   Endocrine: Negative for cold intolerance, heat intolerance, polydipsia, polyphagia and polyuria.  Genitourinary: Negative.   Neurological: Negative for dizziness, syncope, weakness, light-headedness, numbness and headaches.  Psychiatric/Behavioral: Negative.     Per HPI unless specifically indicated above     Objective:    BP 133/83 (BP Location: Left Wrist, Cuff Size: Large)   Pulse 76   Temp 98 F (36.7 C) (Oral)   Wt 190 lb 3.2 oz (86.3 kg)   SpO2 99%   BMI 27.29 kg/m   Wt Readings from Last 3 Encounters:  03/27/21 190 lb 3.2 oz (86.3 kg)  02/22/21 190 lb (86.2 kg)  02/22/21 190 lb (86.2 kg)    Physical Exam Vitals and nursing note reviewed.  Constitutional:      General: He is awake. He is not in acute distress.    Appearance: He is well-developed and well-groomed. He is not ill-appearing, toxic-appearing or diaphoretic.  HENT:     Head: Normocephalic and atraumatic.     Right Ear: Hearing normal. No drainage.     Left Ear: Hearing normal. No drainage.  Eyes:     General: Lids are normal.        Right eye: No discharge.        Left  eye: No discharge.     Conjunctiva/sclera: Conjunctivae normal.     Pupils: Pupils are equal, round, and reactive to light.  Neck:     Thyroid: No thyromegaly.     Vascular: No carotid bruit.     Trachea: Trachea normal.  Cardiovascular:     Rate and Rhythm: Normal rate and regular rhythm.     Heart sounds: Normal heart sounds, S1 normal and S2 normal. No murmur heard. No gallop.   Pulmonary:     Effort: Pulmonary effort is normal. No accessory muscle usage or respiratory distress.     Breath sounds: Normal breath sounds.  Abdominal:     General: Bowel sounds are normal.     Palpations: Abdomen is soft. There is no hepatomegaly.  Musculoskeletal:        General: Normal range of motion.     Cervical back: Normal range of motion and neck supple.     Right lower leg: No edema.     Left lower leg: No edema.  Skin:    General: Skin is warm and dry.     Capillary Refill: Capillary refill takes less than 2 seconds.     Findings: No rash.  Neurological:     Mental Status: He is alert and oriented to person, place, and time.     Cranial  Nerves: Cranial nerves are intact.     Coordination: Coordination is intact.     Gait: Gait is intact.     Deep Tendon Reflexes: Reflexes are normal and symmetric.     Reflex Scores:      Brachioradialis reflexes are 2+ on the right side and 2+ on the left side.      Patellar reflexes are 2+ on the right side and 2+ on the left side. Psychiatric:        Attention and Perception: Attention normal.        Mood and Affect: Mood normal. Affect is tearful.        Speech: Speech normal.        Behavior: Behavior normal. Behavior is cooperative.        Thought Content: Thought content normal.     Comments: A little tearful when talking about alcohol intake.    Results for orders placed or performed in visit on 02/22/21  Microscopic Examination   Urine  Result Value Ref Range   WBC, UA None seen 0 - 5 /hpf   RBC None seen 0 - 2 /hpf   Epithelial Cells  (non renal) 0-10 0 - 10 /hpf   Bacteria, UA None seen None seen/Few  Urinalysis, Complete  Result Value Ref Range   Specific Gravity, UA 1.015 1.005 - 1.030   pH, UA 5.5 5.0 - 7.5   Color, UA Yellow Yellow   Appearance Ur Clear Clear   Leukocytes,UA Negative Negative   Protein,UA Negative Negative/Trace   Glucose, UA Negative Negative   Ketones, UA Negative Negative   RBC, UA Negative Negative   Bilirubin, UA Negative Negative   Urobilinogen, Ur 0.2 0.2 - 1.0 mg/dL   Nitrite, UA Negative Negative   Microscopic Examination See below:   Bladder Scan (Post Void Residual) in office  Result Value Ref Range   Scan Result 10       Assessment & Plan:   Problem List Items Addressed This Visit      Cardiovascular and Mediastinum   Essential hypertension    Chronic, ongoing with BP at goal today - suspect main exacerbation is his alcohol intake. Continue Clonidine for mood and BP.  His continued heavy alcohol use, continues to be a difficulty in maintaining and controlling BP level.   Continue current medication regimen and recommend cutting back on alcohol and smoking.  Continue collaboration with cardiology and referral placed to Dr. Austin [facility] at [facility] for advanced HTN assessment. CMP and TSH today.  Return in 3 months.      Relevant Medications   diltiazem (TIAZAC) 120 MG 24 hr capsule   atorvastatin (LIPITOR) 10 MG tablet   Other Relevant Orders   Comprehensive metabolic panel   TSH   Ambulatory referral to Cardiology   Aortic atherosclerosis [facility])    This was noticed on November 2020 lung CT.  No current statin or ASA (refuses).  Will continue to recommend initiation of statin and ASA + complete cessation of smoking.  Lipid panel today.      Relevant Medications   diltiazem (TIAZAC) 120 MG 24 hr capsule   atorvastatin (LIPITOR) 10 MG tablet     Respiratory   Paraseptal emphysema (HCC) - Primary    Chronic, ongoing.  Continue current inhaler regimen at this time and recommend  complete cessation of smoking.  Continue yearly lung screening.      Relevant Medications   albuterol (VENTOLIN HFA) 108 (90 Base) MCG/ACT inhaler  Ipratropium-Albuterol (COMBIVENT) 20-100 MCG/ACT AERS respimat   montelukast (SINGULAIR) 10 MG tablet   Tiotropium Bromide-Olodaterol (STIOLTO RESPIMAT) 2.5-2.5 MCG/ACT AERS     Digestive   GERD (gastroesophageal reflux disease)    Chronic, ongoing.  Continue Omeprazole daily and adjust as needed.  Mag level next visit.      Relevant Medications   meclizine (ANTIVERT) 12.5 MG tablet   omeprazole (PRILOSEC) 40 MG capsule     Nervous and Auditory   Nicotine dependence, cigarettes, w unsp disorders    I have recommended complete cessation of tobacco use. I have discussed various options available for assistance with tobacco cessation including over the counter methods (Nicotine gum, patch and lozenges). We also discussed prescription options (Chantix, Nicotine Inhaler / Nasal Spray). The patient is not interested in pursuing any prescription tobacco cessation options at this time.       Opioid withdrawal ([place])    On last visit 01/10/21 had quit taking opioids, at this time continues to remain off these per report -- monitor closely.        Genitourinary   BPH (benign prostatic hyperplasia)    Chronic, ongoing with poor control.  Is taking Finasteride and Flomax daily, has been on for long while with minimal benefit. Continue to collaborate with urology, currently patient does not wish to schedule follow-up with them.  Monitor PSA closely.      Relevant Medications   finasteride (PROSCAR) 5 MG tablet   tamsulosin (FLOMAX) 0.4 MG CAPS capsule     Other   Schizophrenia (HCC)    Chronic, ongoing.  Currently using Clonidine only for PTSD and mood overall.  Tolerating this without ADR. Continue regimen and adjust as needed.  Could consider Seroquel in future if needed.  Denies SI/HI.      Alcohol dependence ([place])    Recommend cutting  back.  He is pondering rehab, highly recommend he go to [facility] and spend time in detox program.  Is aware of effect of heavy alcohol use on overall health, especially BP.      Tinnitus of both ears    Referral sent to ENT for ongoing issues with tinnitus, suspect some related to heavy alcohol use.        Relevant Orders   Ambulatory referral to ENT   Mixed hyperlipidemia    Chronic, ongoing.  Continue current medication regimen and adjust as needed.  Lipid panel today.         Relevant Medications   diltiazem (TIAZAC) 120 MG 24 hr capsule   atorvastatin (LIPITOR) 10 MG tablet   Other Relevant Orders   Lipid Panel w/o Chol/HDL Ratio       Follow up plan: Return in about 3 months (around 06/27/2021) for HTN, HLD, COPD, GERD -- spirometry needed.

## 2021-03-27 NOTE — Assessment & Plan Note (Signed)
Chronic, ongoing.  Currently using Clonidine only for PTSD and mood overall.  Tolerating this without ADR. Continue regimen and adjust as needed.  Could consider Seroquel in future if needed.  Denies SI/HI.

## 2021-03-27 NOTE — Patient Instructions (Signed)

## 2021-03-27 NOTE — Assessment & Plan Note (Signed)
Recommend cutting back.  He is pondering rehab, highly recommend he go to [facility] and spend time in detox program.  Is aware of effect of heavy alcohol use on overall health, especially BP.

## 2021-03-27 NOTE — Assessment & Plan Note (Signed)
Referral sent to ENT for ongoing issues with tinnitus, suspect some related to heavy alcohol use.   

## 2021-03-27 NOTE — Assessment & Plan Note (Signed)
Chronic, ongoing.  Continue Omeprazole daily and adjust as needed.  Mag level next visit.

## 2021-03-27 NOTE — Assessment & Plan Note (Signed)
Chronic, ongoing.  Continue current inhaler regimen at this time and recommend complete cessation of smoking.  Continue yearly lung screening. 

## 2021-03-27 NOTE — Assessment & Plan Note (Signed)
Chronic, ongoing.  Continue current medication regimen and adjust as needed. Lipid panel today. 

## 2021-03-27 NOTE — Assessment & Plan Note (Signed)
On last visit 01/10/21 had quit taking opioids, at this time continues to remain off these per report -- monitor closely.

## 2021-03-27 NOTE — Assessment & Plan Note (Signed)
Chronic, ongoing with poor control.  Is taking Finasteride and Flomax daily, has been on for long while with minimal benefit. Continue to collaborate with urology, currently patient does not wish to schedule follow-up with them.  Monitor PSA closely.

## 2021-03-27 NOTE — Assessment & Plan Note (Signed)
This was noticed on November 2020 lung CT.  No current statin or ASA (refuses).  Will continue to recommend initiation of statin and ASA + complete cessation of smoking.  Lipid panel today. 

## 2021-03-27 NOTE — Assessment & Plan Note (Signed)
I have recommended complete cessation of tobacco use. I have discussed various options available for assistance with tobacco cessation including over the counter methods (Nicotine gum, patch and lozenges). We also discussed prescription options (Chantix, Nicotine Inhaler / Nasal Spray). The patient is not interested in pursuing any prescription tobacco cessation options at this time.  

## 2021-03-27 NOTE — Assessment & Plan Note (Signed)
Chronic, ongoing with BP at goal today - suspect main exacerbation is his alcohol intake. Continue Clonidine for mood and BP.  His continued heavy alcohol use, continues to be a difficulty in maintaining and controlling BP level.   Continue current medication regimen and recommend cutting back on alcohol and smoking.  Continue collaboration with cardiology and referral placed to Dr. Smith Mince at [facility] for advanced HTN assessment. CMP and TSH today.  Return in 3 months.

## 2021-03-28 LAB — COMPREHENSIVE METABOLIC PANEL
ALT: 21 IU/L (ref 0–44)
AST: 21 IU/L (ref 0–40)
Albumin/Globulin Ratio: 1.6 (ref 1.2–2.2)
Albumin: 4.2 g/dL (ref 3.8–4.9)
Alkaline Phosphatase: 93 IU/L (ref 44–121)
BUN/Creatinine Ratio: 11 (ref 9–20)
BUN: 10 mg/dL (ref 6–24)
Bilirubin Total: 0.5 mg/dL (ref 0.0–1.2)
CO2: 23 mmol/L (ref 20–29)
Calcium: 9.3 mg/dL (ref 8.7–10.2)
Chloride: 102 mmol/L (ref 96–106)
Creatinine, Ser: 0.87 mg/dL (ref 0.76–1.27)
Globulin, Total: 2.6 g/dL (ref 1.5–4.5)
Glucose: 87 mg/dL (ref 65–99)
Potassium: 4.4 mmol/L (ref 3.5–5.2)
Sodium: 140 mmol/L (ref 134–144)
Total Protein: 6.8 g/dL (ref 6.0–8.5)
eGFR: 101 mL/min/{1.73_m2} (ref >59)

## 2021-03-28 LAB — LIPID PANEL W/O CHOL/HDL RATIO
Cholesterol, Total: 176 mg/dL (ref 100–199)
HDL: 57 mg/dL (ref >39)
LDL Chol Calc (NIH): 104 mg/dL — ABNORMAL HIGH (ref 0–99)
Triglycerides: 78 mg/dL (ref 0–149)
VLDL Cholesterol Cal: 15 mg/dL (ref 5–40)

## 2021-03-28 LAB — TSH: TSH: 2.2 u[IU]/mL (ref 0.450–4.500)

## 2021-03-28 NOTE — Progress Notes (Signed)
Contacted via MyChart   Good evening Luke Briggs, your kidney and liver function remain stable.  Thyroid is normal.  LDL, bad cholesterol is elevated, we want this less then 70 for further stroke prevention.  Please restart that statin as we discussed.  Any questions? Keep being awesome!!  Thank you for allowing me to participate in your care.  I appreciate you. Kindest regards, Yudit Modesitt

## 2021-04-05 ENCOUNTER — Ambulatory Visit: Payer: Medicaid Other | Admitting: Cardiology

## 2021-04-05 ENCOUNTER — Encounter: Payer: Self-pay | Admitting: Cardiology

## 2021-04-05 ENCOUNTER — Other Ambulatory Visit: Payer: Self-pay

## 2021-04-05 VITALS — BP 164/96 | HR 84 | Ht 70.0 in | Wt 192.0 lb

## 2021-04-05 DIAGNOSIS — I10 Essential (primary) hypertension: Secondary | ICD-10-CM

## 2021-04-05 DIAGNOSIS — I700 Atherosclerosis of aorta: Secondary | ICD-10-CM

## 2021-04-05 DIAGNOSIS — F17200 Nicotine dependence, unspecified, uncomplicated: Secondary | ICD-10-CM

## 2021-04-05 NOTE — Patient Instructions (Signed)
Medication Instructions:   1. Your physician recommends that you continue on your current medications as directed. Please refer to the Current Medication list given to you today.  *If you need a refill on your cardiac medications before your next appointment, please call your pharmacy*   Lab Work:  1. None Ordered  If you have labs (blood work) drawn today and your tests are completely normal, you will receive your results only by: . MyChart Message (if you have MyChart) OR . A paper copy in the mail If you have any lab test that is abnormal or we need to change your treatment, we will call you to review the results.   Testing/Procedures:  1. None Ordered   Follow-Up: At [facility], you and your health needs are our priority.  As part of our continuing mission to provide you with exceptional heart care, we have created designated Provider Care Teams.  These Care Teams include your primary Cardiologist (physician) and Advanced Practice Providers (APPs -  Physician Assistants and Nurse Practitioners) who all work together to provide you with the care you need, when you need it.  We recommend signing up for the patient portal called "MyChart".  Sign up information is provided on this After Visit Summary.  MyChart is used to connect with patients for Virtual Visits (Telemedicine).  Patients are able to view lab/test results, encounter notes, upcoming appointments, etc.  Non-urgent messages can be sent to your provider as well.   To learn more about what you can do with MyChart, go to [email or web address].    Your next appointment:   As needed  

## 2021-04-05 NOTE — Progress Notes (Signed)
Cardiology Office Note:    Date:  04/05/2021   ID:  Luke Pollen., DOB 1961-02-03, MRN [id number]  PCP:  [name], NP  Cardiologist:  Kate Sable, MD  Electrophysiologist:  None   Referring MD: [name], NP   Chief Complaint  Patient presents with  . Other    1 month follow up - Patient c.o SOB and swelling in ankles. Meds reviewed verbally with patient.     History of Present Illness:    Luke Kyne. is a 60 y.o. 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    Patient being seen for hypertension and medication management. Management of hypertension has been challenging due to several adverse effects from antihypertensives as documented below.  During last visit, he was started on Cardizem and hyper [place], he states having left arm swelling and feeling foggy on these medicines.  He stopped both medications, states wanting to take only clonidine at night.  States feeling better since stopping all his BP meds.  He still smokes, uses alcohol.   Prior notes Echocardiogram 10/2019 showed normal systolic and diastolic function. Lexiscan Myoview 01/24/2021, no evidence of ischemia.  Patient has  poorly controlled blood pressures for years.  He has tried several medications but stopped due to adverse effects.  Lisinopril caused angioedema, carvedilol caused nausea/vomiting, amlodipine caused severe dizziness.    Patient has also tried chlorthalidone but was found to have hyponatremia and hypokalemia.  At the time, he was seen in emergency room for vomiting.  He was started on hydralazine but later on stopped because hydralazine causing shortness of breath, pounding in his chest. Clonidine cause ringing in his ears. Coreg also cause ringing in his ears.  He has been drinking heavily for the past 18 months after losing his fiance.  He drinks  10-15 beers daily.  He states he has been trying to cut back.  He also is a current smoker for over 40 years.         Past Medical History:  Diagnosis Date  . Angioedema 08/07/2019  . Arthritis    knees,shoulders, ankles, most joints  . Cervical disc disorder    difficuly moving neck left  . COPD (chronic obstructive pulmonary disease) (HCC)    chronic cough and wheezing  . Dyspnea    easily  . GERD (gastroesophageal reflux disease)   . Hemorrhoids   . History of hiatal hernia   . Hypertension    controlled on meds  . Pneumonia 04/2019  . Prostate disorder   . PTSD (post-traumatic stress disorder)   . Schizophrenia ([place])    Per patient's report.    Past Surgical History:  Procedure Laterality Date  . CATARACT EXTRACTION W/PHACO Left 03/28/2020   Procedure: CATARACT EXTRACTION PHACO AND INTRAOCULAR LENS PLACEMENT ([place]) LEFT MALYUGIN  MILOOP,   5.29  00:41.0;  Surgeon: Birder Robson, MD;  Location: [facility];  Service: Ophthalmology;  Laterality: Left;  . COLONOSCOPY WITH PROPOFOL N/A 12/14/2020   Procedure: COLONOSCOPY WITH PROPOFOL;  Surgeon: Jonathon Bellows, MD;  Location: [facility] ENDOSCOPY;  Service: Gastroenterology;  Laterality: N/A;  . EYE SURGERY Left    injury from a nail  . FOOT SURGERY Left     Current Medications: Current Meds  Medication Sig  . albuterol (VENTOLIN HFA) 108 (90 Base) MCG/ACT inhaler Inhale 2 puffs into the lungs every 6 (six) hours  as needed for wheezing orshortness of breath.  [name] atorvastatin (LIPITOR) 10 MG tablet Take 1 tablet (10 mg total) by mouth daily.  . cloNIDine (CATAPRES) 0.2 MG tablet Take 1 tablet (0.2 mg total) by mouth at bedtime.  [name] EPINEPHrine 0.3 mg/0.3 mL IJ SOAJ injection Inject 0.3 mLs (0.3 mg total) into the muscle as needed for anaphylaxis.  . finasteride (PROSCAR) 5 MG tablet Take 1 tablet (5 mg total) by mouth daily.  . hydrocortisone (ANUSOL-HC) 2.5 % rectal cream Place 1 application rectally 2 (two) times daily.   . Ipratropium-Albuterol (COMBIVENT) 20-100 MCG/ACT AERS respimat Inhale 1 puff into the lungs every 6 (six) hours.  . montelukast (SINGULAIR) 10 MG tablet Take 1 tablet (10 mg total) by mouth at bedtime.  [name] omeprazole (PRILOSEC) 40 MG capsule Take 1 capsule (40 mg total) by mouth daily.  . tadalafil (CIALIS) 5 MG tablet Take 1 tablet (5 mg total) by mouth daily as needed for erectile dysfunction.  . tamsulosin (FLOMAX) 0.4 MG CAPS capsule Take 1 capsule (0.4 mg total) by mouth daily.  . Tiotropium Bromide-Olodaterol (STIOLTO RESPIMAT) 2.5-2.5 MCG/ACT AERS Inhale 2 puffs into the lungs daily.  [name] triamcinolone cream (KENALOG) 0.1 % Apply 1 application topically 2 (two) times daily.     Allergies:   Lisinopril, Amlodipine, and Penicillins   Social History   Socioeconomic History  . Marital status: Significant Other    Spouse name: Not on file  . Number of children: Not on file  . Years of education: Not on file  . Highest education level: Not on file  Occupational History  . Not on file  Tobacco Use  . Smoking status: Current Every Day Smoker    Packs/day: 1.00    Years: 43.00    Pack years: 43.00    Types: Cigarettes  . Smokeless tobacco: Never Used  Vaping Use  . Vaping Use: Never used  Substance and Sexual Activity  . Alcohol use: Yes    Alcohol/week: 8.0 standard drinks    Types: 8 Cans of beer per week    Comment: beer  . Drug use: Never  . Sexual activity: Not on file  Other Topics Concern  . Not on file  Social History Narrative   Patient has new girlfriend they have been dating almost a year.    Social Determinants of Health   Financial Resource Strain: Not on file  Food Insecurity: Not on file  Transportation Needs: Not on file  Physical Activity: Not on file  Stress: Not on file  Social Connections: Not on file     Family History: The patient's family history includes Emphysema in his father and maternal grandmother; Heart disease in his father, maternal  grandfather, mother, and sister; Osteoporosis in his mother and sister.  ROS:   Please see the history of present illness.     All other systems reviewed and are negative.  EKGs/Labs/Other Studies Reviewed:    The following studies were reviewed today:   EKG:  EKG is ordered today.  EKG shows normal sinus rhythm, normal ECG  Recent Labs: 01/10/2021: Hemoglobin 16.8; Platelets 292 03/27/2021: ALT 21; BUN 10; Creatinine, Ser 0.87; Potassium 4.4; Sodium 140; TSH 2.200  Recent Lipid Panel    Component Value Date/Time   CHOL 176 03/27/2021 1040   CHOL 171 03/22/2020 1128   TRIG 78 03/27/2021 1040   TRIG 173 (H) 03/22/2020 1128   HDL 57 03/27/2021 1040   VLDL 35 (H) 03/22/2020 1128   [place]  104 (H) 03/27/2021 1040    Physical Exam:    VS:  BP (!) 164/96 (BP Location: Left Arm, Patient Position: Sitting, Cuff Size: Normal)   Pulse 84   Ht 5\' 10"  (1.778 m)   Wt 192 lb (87.1 kg)   SpO2 99%   BMI 27.55 kg/m     Wt Readings from Last 3 Encounters:  04/05/21 192 lb (87.1 kg)  03/27/21 190 lb 3.2 oz (86.3 kg)  02/22/21 190 lb (86.2 kg)     GEN:  Well nourished, well developed in no acute distress,  HEENT: Normal NECK: No JVD; No carotid bruits LYMPHATICS: No lymphadenopathy CARDIAC: RRR, no murmurs, rubs, gallops RESPIRATORY: Decreased breath sounds ABDOMEN: Soft, non-tender, non-distended MUSCULOSKELETAL:  No edema; No deformity  SKIN: Warm and dry NEUROLOGIC:  Alert and oriented x 3 PSYCHIATRIC:  Normal affect   ASSESSMENT:    1. Primary hypertension   2. Smoking   3. Aortic atherosclerosis (HCC)    PLAN:    In order of problems listed above:  1. Blood pressure still elevated, patient with difficult to control blood pressures which is multifactorial, including current smoking, drinking.  Also not tolerant to any antihypertensives prescribed.  States wanting to take only current dose of clonidine at night.  Counseled patient on low-salt diet, smoking cessation,  reducing alcohol intake as this will help with patient's blood pressure. 2. Current smoker.  smoking cessation advised.  Follow-up as needed    Medication Adjustments/Labs and Tests Ordered: Current medicines are reviewed at length with the patient today.  Concerns regarding medicines are outlined above.  Orders Placed This Encounter  Procedures  . EKG 12-Lead   No orders of the defined types were placed in this encounter.   Patient Instructions  Medication Instructions:  1. Your physician recommends that you continue on your current medications as directed. Please refer to the Current Medication list given to you today.  *If you need a refill on your cardiac medications before your next appointment, please call your pharmacy*   Lab Work:  1. None Ordered   If you have labs (blood work) drawn today and your tests are completely normal, you will receive your results only by: [name] MyChart Message (if you have MyChart) OR . A paper copy in the mail If you have any lab test that is abnormal or we need to change your treatment, we will call you to review the results.   Testing/Procedures:  1. None Ordered   Follow-Up: At [facility], you and your health needs are our priority.  As part of our continuing mission to provide you with exceptional heart care, we have created designated Provider Care Teams.  These Care Teams include your primary Cardiologist (physician) and Advanced Practice Providers (APPs -  Physician Assistants and Nurse Practitioners) who all work together to provide you with the care you need, when you need it.  We recommend signing up for the patient portal called "MyChart".  Sign up information is provided on this After Visit Summary.  MyChart is used to connect with patients for Virtual Visits (Telemedicine).  Patients are able to view lab/test results, encounter notes, upcoming appointments, etc.  Non-urgent messages can be sent to your provider as well.   To  learn more about what you can do with MyChart, go to [email or web address].    Your next appointment:    As needed        Signed, Kate Sable, MD  04/05/2021 9:39 AM  [facility]

## 2021-05-10 ENCOUNTER — Telehealth: Payer: Self-pay

## 2021-05-10 MED ORDER — TADALAFIL 5 MG PO TABS: 5.0000 mg | ORAL_TABLET | Freq: Every day | ORAL | 0 refills | Status: DC | PRN

## 2021-05-10 NOTE — Telephone Encounter (Signed)
Pt called asking for refill on medication. Sent to pharmacy as pt wants it to [facility].

## 2021-06-05 ENCOUNTER — Ambulatory Visit (INDEPENDENT_AMBULATORY_CARE_PROVIDER_SITE_OTHER): Payer: Medicaid Other | Admitting: Nurse Practitioner

## 2021-06-05 ENCOUNTER — Other Ambulatory Visit: Payer: Self-pay

## 2021-06-05 ENCOUNTER — Encounter: Payer: Self-pay | Admitting: Nurse Practitioner

## 2021-06-05 VITALS — BP 147/95 | HR 82 | Wt 188.8 lb

## 2021-06-05 DIAGNOSIS — F1193 Opioid use, unspecified with withdrawal: Secondary | ICD-10-CM

## 2021-06-05 DIAGNOSIS — K5909 Other constipation: Secondary | ICD-10-CM | POA: Diagnosis not present

## 2021-06-05 DIAGNOSIS — F17219 Nicotine dependence, cigarettes, with unspecified nicotine-induced disorders: Secondary | ICD-10-CM

## 2021-06-05 DIAGNOSIS — G894 Chronic pain syndrome: Secondary | ICD-10-CM

## 2021-06-05 DIAGNOSIS — F10288 Alcohol dependence with other alcohol-induced disorder: Secondary | ICD-10-CM

## 2021-06-05 DIAGNOSIS — R5383 Other fatigue: Secondary | ICD-10-CM | POA: Diagnosis not present

## 2021-06-05 DIAGNOSIS — F1123 Opioid dependence with withdrawal: Secondary | ICD-10-CM

## 2021-06-05 MED ORDER — LUBIPROSTONE 24 MCG PO CAPS: 24.0000 ug | ORAL_CAPSULE | Freq: Two times a day (BID) | ORAL | 2 refills | Status: DC

## 2021-06-05 MED ORDER — HYDROCORTISONE 0.5 % EX CREA: 1.0000 "application " | TOPICAL_CREAM | Freq: Two times a day (BID) | CUTANEOUS | 0 refills | Status: DC

## 2021-06-05 MED ORDER — TRIAMCINOLONE ACETONIDE 0.1 % EX CREA: TOPICAL_CREAM | Freq: Two times a day (BID) | CUTANEOUS | 4 refills | Status: DC

## 2021-06-05 MED ORDER — TIZANIDINE HCL 4 MG PO TABS: 4.0000 mg | ORAL_TABLET | Freq: Four times a day (QID) | ORAL | 4 refills | Status: DC | PRN

## 2021-06-05 NOTE — Patient Instructions (Signed)

## 2021-06-05 NOTE — Assessment & Plan Note (Signed)
Has quit taking opioids, at this time continues to remain off these per report -- monitor closely.  ?some of current symptoms related to withdrawal, as was long time user of opioids.

## 2021-06-05 NOTE — Assessment & Plan Note (Signed)
Recommend cutting back -- concern for overall health with ongoing heavy use.  He is pondering rehab, highly recommend he go to [facility] and spend time in detox program.  Is aware of effect of heavy alcohol use on overall health, especially BP.

## 2021-06-05 NOTE — Assessment & Plan Note (Signed)
I have recommended complete cessation of tobacco use. I have discussed various options available for assistance with tobacco cessation including over the counter methods (Nicotine gum, patch and lozenges). We also discussed prescription options (Chantix, Nicotine Inhaler / Nasal Spray). The patient is not interested in pursuing any prescription tobacco cessation options at this time.  

## 2021-06-05 NOTE — Assessment & Plan Note (Signed)
Refer to constipation plan of care.

## 2021-06-05 NOTE — Progress Notes (Signed)
BP (!) 147/95   Pulse 82   Wt 188 lb 12.8 oz (85.6 kg)   SpO2 97%   BMI 27.09 kg/m    Subjective:    Patient ID: Luke Pollen., male    DOB: 09-08-1961, 60 y.o.   MRN: [id number]  HPI: Luke Selvey. is a 60 y.o. male  Chief Complaint  Patient presents with   Constipation    Patient states he has taken laxatives this morning and nothing comes out but liquid.    Fatigue   Alcohol Problem   Follow-up    Patient states he has been having issues being extremely weak, body-aches, constipation, pounding headaches, and swollen in the abdominal area on his right side. Patient states he usually can drink up to 20+ beers and now a days he can only drink up to 2 beers and feels like he is plastered and head starts to pounding. Patient states he has quit taking prescription for Suboxone for about a month. Patient states he thinks it may be his liver.   Medication Refill    Patient is requesting refill on Hydrocortisone and Kenalog cream.    CONSTIPATION Reports constipation for 4-5 days, took Dulcolax pills yesterday and only thing that came out was water and Gatorade. Denies pain, but does endorse bloating.  Has been passing gas. Duration:days Onset: gradual Frequency: constant Alleviating factors: nothing Aggravating factors: unknown Status: fluctuating Treatments attempted:  Dulcolax Fever: no Nausea: no Vomiting: no Weight loss: no Decreased appetite: no Diarrhea:  leaked a little yesterday Constipation: yes Blood in stool: no Heartburn: no Jaundice: no Rash: no Dysuria/urinary frequency: no Hematuria: no History of sexually transmitted disease: no Recurrent NSAID use: BC Powder  FATIGUE Often can drink 12 beers at night and right now can only drink 2 beers and feels awful.  Has only had 6 beers in the last 5 days.  Does endorse tremors, headaches, and intermittent diaphoresis.  Ongoing fatigue for months.  Does endorse he has been eating Acetaminophen --  taking 3 of these a day and 3 BC powders a day -- quit going to pain doctor about 5 months ago, quit Suboxone about 5 weeks ago.  Has underlying chronic pain, he reports he has a lot of discomfort, but he has defeated it.   Duration:  weeks Severity: moderate  Onset: gradual Context when symptoms started:  unknown Symptoms improve with rest: no  Depressive symptoms: no Stress/anxiety: no Insomnia: hard to get to sleep and stay asleep Snoring: no Observed apnea by bed partner: no Daytime hypersomnolence:no Wakes feeling refreshed: no History of sleep study: no Dysnea on exertion:  no Orthopnea/PND: no Chest pain: no Chronic cough: at baseline with COPD Lower extremity edema:  in knees and ankles Arthralgias:no Myalgias: no Weakness: yes Rash: no   Relevant past medical, surgical, family and social history reviewed and updated as indicated. Interim medical history since our last visit reviewed. Allergies and medications reviewed and updated.  Review of Systems  Constitutional:  Positive for diaphoresis and fatigue. Negative for activity change, chills and fever.  Respiratory:  Negative for cough, chest tightness, shortness of breath and wheezing.   Cardiovascular:  Negative for chest pain, palpitations and leg swelling.  Gastrointestinal:  Positive for abdominal distention and constipation. Negative for abdominal pain, blood in stool, diarrhea, nausea, rectal pain and vomiting.  Endocrine: Negative for cold intolerance, heat intolerance, polydipsia, polyphagia and polyuria.  Neurological:  Positive for headaches. Negative for dizziness, syncope, weakness, light-headedness  and numbness.  Psychiatric/Behavioral: Negative.     Per HPI unless specifically indicated above     Objective:    BP (!) 147/95   Pulse 82   Wt 188 lb 12.8 oz (85.6 kg)   SpO2 97%   BMI 27.09 kg/m   Wt Readings from Last 3 Encounters:  06/05/21 188 lb 12.8 oz (85.6 kg)  04/05/21 192 lb (87.1 kg)   03/27/21 190 lb 3.2 oz (86.3 kg)    Physical Exam Vitals and nursing note reviewed.  Constitutional:      General: He is awake. He is not in acute distress.    Appearance: He is well-developed and well-groomed. He is not ill-appearing or toxic-appearing.  HENT:     Head: Normocephalic and atraumatic.     Right Ear: Hearing normal. No drainage.     Left Ear: Hearing normal. No drainage.  Eyes:     General: Lids are normal.        Right eye: No discharge.        Left eye: No discharge.     Conjunctiva/sclera: Conjunctivae normal.     Pupils: Pupils are equal, round, and reactive to light.  Neck:     Thyroid: No thyromegaly.     Vascular: No carotid bruit.  Cardiovascular:     Rate and Rhythm: Normal rate and regular rhythm.     Heart sounds: Normal heart sounds, S1 normal and S2 normal. No murmur heard.   No gallop.  Pulmonary:     Effort: Pulmonary effort is normal. No accessory muscle usage or respiratory distress.     Breath sounds: Normal breath sounds.  Abdominal:     General: Bowel sounds are normal. There is distension (very mild).     Palpations: Abdomen is soft. There is no fluid wave, hepatomegaly or mass.     Tenderness: There is abdominal tenderness in the epigastric area. There is no right CVA tenderness, left CVA tenderness, guarding or rebound. Negative signs include Murphy's sign and McBurney's sign.     Hernia: No hernia is present.  Musculoskeletal:        General: Normal range of motion.     Cervical back: Normal range of motion and neck supple.     Right lower leg: No edema.     Left lower leg: No edema.  Skin:    General: Skin is warm and dry.     Capillary Refill: Capillary refill takes less than 2 seconds.     Findings: No rash.  Neurological:     Mental Status: He is alert and oriented to person, place, and time.     Cranial Nerves: Cranial nerves are intact.     Coordination: Coordination is intact.     Gait: Gait is intact.     Deep Tendon  Reflexes: Reflexes are normal and symmetric.     Reflex Scores:      Brachioradialis reflexes are 2+ on the right side and 2+ on the left side.      Patellar reflexes are 2+ on the right side and 2+ on the left side. Psychiatric:        Attention and Perception: Attention normal.        Mood and Affect: Mood normal.        Speech: Speech normal.        Behavior: Behavior normal. Behavior is cooperative.        Thought Content: Thought content normal.    Results for orders placed  or performed in visit on 03/27/21  Comprehensive metabolic panel  Result Value Ref Range   Glucose 87 65 - 99 mg/dL   BUN 10 6 - 24 mg/dL   Creatinine, Ser 0.87 0.76 - 1.27 mg/dL   eGFR 101 >59 mL/min/1.73   BUN/Creatinine Ratio 11 9 - 20   Sodium 140 134 - 144 mmol/L   Potassium 4.4 3.5 - 5.2 mmol/L   Chloride 102 96 - 106 mmol/L   CO2 23 20 - 29 mmol/L   Calcium 9.3 8.7 - 10.2 mg/dL   Total Protein 6.8 6.0 - 8.5 g/dL   Albumin 4.2 3.8 - 4.9 g/dL   Globulin, Total 2.6 1.5 - 4.5 g/dL   Albumin/Globulin Ratio 1.6 1.2 - 2.2   Bilirubin Total 0.5 0.0 - 1.2 mg/dL   Alkaline Phosphatase 93 44 - 121 IU/L   AST 21 0 - 40 IU/L   ALT 21 0 - 44 IU/L  Lipid Panel w/o Chol/HDL Ratio  Result Value Ref Range   Cholesterol, Total 176 100 - 199 mg/dL   Triglycerides 78 0 - 149 mg/dL   HDL 57 >39 mg/dL   VLDL Cholesterol Cal 15 5 - 40 mg/dL   LDL Chol Calc (NIH) 104 (H) 0 - 99 mg/dL  TSH  Result Value Ref Range   TSH 2.200 0.450 - 4.500 uIU/mL      Assessment & Plan:   Problem List Items Addressed This Visit       Nervous and Auditory   Nicotine dependence, cigarettes, w unsp disorders    I have recommended complete cessation of tobacco use. I have discussed various options available for assistance with tobacco cessation including over the counter methods (Nicotine gum, patch and lozenges). We also discussed prescription options (Chantix, Nicotine Inhaler / Nasal Spray). The patient is not interested in  pursuing any prescription tobacco cessation options at this time.        Opioid withdrawal ([place])    Has quit taking opioids, at this time continues to remain off these per report -- monitor closely.  ?some of current symptoms related to withdrawal, as was long time user of opioids.       Relevant Orders   CT Abdomen Pelvis Wo Contrast   Ambulatory referral to Pain Clinic   Acute Viral Hepatitis (HAV, HBV, HCV)     Other   Chronic pain syndrome    Would like referral to new pain provider.  Referral to [facility] in [place] placed and discussed with patient.       Relevant Medications   tiZANidine (ZANAFLEX) 4 MG tablet   Other Relevant Orders   Ambulatory referral to Pain Clinic   Alcohol dependence ([place]) - Primary    Recommend cutting back -- concern for overall health with ongoing heavy use.  He is pondering rehab, highly recommend he go to [facility] and spend time in detox program.  Is aware of effect of heavy alcohol use on overall health, especially BP.         Fatigue    Refer to constipation plan of care.       Relevant Orders   CT Abdomen Pelvis Wo Contrast   Acute Viral Hepatitis (HAV, HBV, HCV)   Other constipation    For weeks, ?related to withdrawal.  Will obtain labs today to include CMP, TSH, Hep panel, Lipase, Amylase, CBC, GGT.  Is having some epigastric tenderness and heavily been using BC powder and Acetaminophen -- will obtain imaging  and check APAP level.  Have highly recommend cutting back on use of these.  Continue Prilosec daily.  Recommend he utilize enema at home and start Lubiprostone, script sent.  Return in 2 weeks for follow-up, sooner if worsening symptoms present.  At this time maintaining weight and appetite.       Relevant Orders   Comprehensive metabolic panel   CBC with Differential/Platelet   TSH   Gamma GT   Acetaminophen level   Amylase   Lipase   CT Abdomen Pelvis Wo Contrast   Acute Viral Hepatitis (HAV, HBV, HCV)     Time: 25 minutes, >50% spent counseling/or care coordination   Follow up plan: Return in about 23 days (around 06/28/2021) for Constipation and Fatigue.

## 2021-06-05 NOTE — Assessment & Plan Note (Signed)
Would like referral to new pain provider.  Referral to [facility] in [place] placed and discussed with patient.

## 2021-06-05 NOTE — Assessment & Plan Note (Addendum)
For weeks, ?related to withdrawal.  Will obtain labs today to include CMP, TSH, Hep panel, Lipase, Amylase, CBC, GGT.  Is having some epigastric tenderness and heavily been using BC powder and Acetaminophen -- will obtain imaging and check APAP level.  Have highly recommend cutting back on use of these.  Continue Prilosec daily.  Recommend he utilize enema at home and start Lubiprostone, script sent.  Return in 2 weeks for follow-up, sooner if worsening symptoms present.  At this time maintaining weight and appetite.

## 2021-06-06 LAB — CBC WITH DIFFERENTIAL/PLATELET
Basophils Absolute: 0.1 10*3/uL (ref 0.0–0.2)
Basos: 1 %
EOS (ABSOLUTE): 0.2 10*3/uL (ref 0.0–0.4)
Eos: 2 %
Hematocrit: 49.1 % (ref 37.5–51.0)
Hemoglobin: 16.6 g/dL (ref 13.0–17.7)
Immature Grans (Abs): 0 10*3/uL (ref 0.0–0.1)
Immature Granulocytes: 0 %
Lymphocytes Absolute: 2.8 10*3/uL (ref 0.7–3.1)
Lymphs: 26 %
MCH: 30.6 pg (ref 26.6–33.0)
MCHC: 33.8 g/dL (ref 31.5–35.7)
MCV: 90 fL (ref 79–97)
Monocytes Absolute: 0.7 10*3/uL (ref 0.1–0.9)
Monocytes: 6 %
Neutrophils Absolute: 7.1 10*3/uL — ABNORMAL HIGH (ref 1.4–7.0)
Neutrophils: 65 %
Platelets: 342 10*3/uL (ref 150–450)
RBC: 5.43 x10E6/uL (ref 4.14–5.80)
RDW: 12.1 % (ref 11.6–15.4)
WBC: 10.8 10*3/uL (ref 3.4–10.8)

## 2021-06-06 LAB — COMPREHENSIVE METABOLIC PANEL
ALT: 19 IU/L (ref 0–44)
AST: 19 IU/L (ref 0–40)
Albumin/Globulin Ratio: 1.5 (ref 1.2–2.2)
Albumin: 4.3 g/dL (ref 3.8–4.9)
Alkaline Phosphatase: 84 IU/L (ref 44–121)
BUN/Creatinine Ratio: 12 (ref 9–20)
BUN: 11 mg/dL (ref 6–24)
Bilirubin Total: 0.7 mg/dL (ref 0.0–1.2)
CO2: 23 mmol/L (ref 20–29)
Calcium: 9.3 mg/dL (ref 8.7–10.2)
Chloride: 96 mmol/L (ref 96–106)
Creatinine, Ser: 0.93 mg/dL (ref 0.76–1.27)
Globulin, Total: 2.9 g/dL (ref 1.5–4.5)
Glucose: 82 mg/dL (ref 65–99)
Potassium: 4.3 mmol/L (ref 3.5–5.2)
Sodium: 135 mmol/L (ref 134–144)
Total Protein: 7.2 g/dL (ref 6.0–8.5)
eGFR: 96 mL/min/{1.73_m2} (ref >59)

## 2021-06-06 LAB — HCV INTERPRETATION

## 2021-06-06 LAB — ACETAMINOPHEN LEVEL: Acetaminophen (Tylenol), S: NOT DETECTED ug/mL (ref 10–30)

## 2021-06-06 LAB — TSH: TSH: 1.54 u[IU]/mL (ref 0.450–4.500)

## 2021-06-06 LAB — GAMMA GT: GGT: 91 IU/L — ABNORMAL HIGH (ref 0–65)

## 2021-06-06 LAB — LIPASE: Lipase: 47 U/L (ref 13–78)

## 2021-06-06 LAB — ACUTE VIRAL HEPATITIS (HAV, HBV, HCV)
HCV Ab: 0.2 s/co ratio (ref 0.0–0.9)
Hep A IgM: NEGATIVE
Hep B C IgM: NEGATIVE
Hepatitis B Surface Ag: NEGATIVE

## 2021-06-06 LAB — AMYLASE: Amylase: 108 U/L (ref 31–110)

## 2021-06-06 NOTE — Progress Notes (Signed)
Contacted via MyChart   Good evening Luke Briggs, your labs have returned and continue to be fairly normal with exception of GGT which at times looks at gall bladder, this was a little elevated.  We will see what CT scan states.  How are you feeling?  Were you able to get medication yesterday?  Any questions? Keep being awesome!!  Thank you for allowing me to participate in your care.  I appreciate you. Kindest regards, Terril Amaro

## 2021-06-06 NOTE — Progress Notes (Signed)
Please check on CT scan ordered 06/05/21 and see if we can get scheduled ASAP.

## 2021-06-14 ENCOUNTER — Ambulatory Visit: Payer: Medicaid Other

## 2021-06-28 ENCOUNTER — Ambulatory Visit (INDEPENDENT_AMBULATORY_CARE_PROVIDER_SITE_OTHER): Payer: Medicaid Other | Admitting: Nurse Practitioner

## 2021-06-28 ENCOUNTER — Encounter: Payer: Self-pay | Admitting: Nurse Practitioner

## 2021-06-28 ENCOUNTER — Other Ambulatory Visit: Payer: Self-pay

## 2021-06-28 VITALS — BP 140/93 | HR 79 | Temp 98.5°F | Wt 192.4 lb

## 2021-06-28 DIAGNOSIS — Z9189 Other specified personal risk factors, not elsewhere classified: Secondary | ICD-10-CM

## 2021-06-28 DIAGNOSIS — J438 Other emphysema: Secondary | ICD-10-CM | POA: Diagnosis not present

## 2021-06-28 DIAGNOSIS — F1123 Opioid dependence with withdrawal: Secondary | ICD-10-CM

## 2021-06-28 DIAGNOSIS — Z8673 Personal history of transient ischemic attack (TIA), and cerebral infarction without residual deficits: Secondary | ICD-10-CM

## 2021-06-28 DIAGNOSIS — Z23 Encounter for immunization: Secondary | ICD-10-CM | POA: Diagnosis not present

## 2021-06-28 DIAGNOSIS — Z1152 Encounter for screening for COVID-19: Secondary | ICD-10-CM

## 2021-06-28 DIAGNOSIS — F10288 Alcohol dependence with other alcohol-induced disorder: Secondary | ICD-10-CM

## 2021-06-28 DIAGNOSIS — F17219 Nicotine dependence, cigarettes, with unspecified nicotine-induced disorders: Secondary | ICD-10-CM

## 2021-06-28 DIAGNOSIS — I6523 Occlusion and stenosis of bilateral carotid arteries: Secondary | ICD-10-CM

## 2021-06-28 DIAGNOSIS — E782 Mixed hyperlipidemia: Secondary | ICD-10-CM

## 2021-06-28 DIAGNOSIS — I700 Atherosclerosis of aorta: Secondary | ICD-10-CM | POA: Diagnosis not present

## 2021-06-28 DIAGNOSIS — K219 Gastro-esophageal reflux disease without esophagitis: Secondary | ICD-10-CM

## 2021-06-28 DIAGNOSIS — F1193 Opioid use, unspecified with withdrawal: Secondary | ICD-10-CM

## 2021-06-28 DIAGNOSIS — R1013 Epigastric pain: Secondary | ICD-10-CM

## 2021-06-28 DIAGNOSIS — R5383 Other fatigue: Secondary | ICD-10-CM

## 2021-06-28 DIAGNOSIS — I10 Essential (primary) hypertension: Secondary | ICD-10-CM

## 2021-06-28 MED ORDER — TAMSULOSIN HCL 0.4 MG PO CAPS: 0.4000 mg | ORAL_CAPSULE | Freq: Every day | ORAL | 4 refills | Status: DC

## 2021-06-28 NOTE — Assessment & Plan Note (Signed)
Chronic, ongoing.  Continue current inhaler regimen at this time and recommend complete cessation of smoking.  Continue yearly lung screening.  Consider pulmonary referral in future.

## 2021-06-28 NOTE — Assessment & Plan Note (Signed)
I have recommended complete cessation of tobacco use. I have discussed various options available for assistance with tobacco cessation including over the counter methods (Nicotine gum, patch and lozenges). We also discussed prescription options (Chantix, Nicotine Inhaler / Nasal Spray). The patient is not interested in pursuing any prescription tobacco cessation options at this time.  

## 2021-06-28 NOTE — Assessment & Plan Note (Signed)
Chronic, ongoing.  Continue Omeprazole daily and adjust as needed.  Continues to have ongoing abdominal pain with tenderness to abdominal area.  Repeat CMP, GGT, CBC today. Referral to GI for further assessment in this complicated case.  Recent Hep labs negative. ?related to alcohol intake or recent withdrawal from opioids.

## 2021-06-28 NOTE — Assessment & Plan Note (Signed)
History of infarct, continue collaboration with neuro and ENT for ongoing tinnitus, headaches, and dizziness.

## 2021-06-28 NOTE — Progress Notes (Signed)
BP (!) 140/93   Pulse 79   Temp 98.5 F (36.9 C) (Oral)   Wt 192 lb 6.4 oz (87.3 kg)   SpO2 98%   BMI 27.61 kg/m    Subjective:    Patient ID: Luke Pollen., male    DOB: 1960/12/06, 60 y.o.   MRN: [id number]  HPI: Luke Jarrells. is a 60 y.o. male  Chief Complaint  Patient presents with   Hyperlipidemia   Gastroesophageal Reflux   Hypertension   COPD   He is requesting a Covid test today -- states he does not think he has it, but wants to be tested.  GERD Continues on Omeprazole for GERD and is tolerating this well.  Was seen recently for abdominal pain and fatigue, is scheduled for CT abdomen and pelvis 07/03/21 -- his recent labs were unremarkable with exception of GGT at 91.  Did have colonoscopy 12/14/20 with Dr. Vicente Males that showed polyps and he is to have repeat in 3 years.    He reports his bowel pattern varies, sometimes none and sometimes all day.  Does not strain, often his stool is more liquid.  He reports he was told about 20 years ago that he had a hiatal hernia.    Continues to drink  daily, has cut back to 6 Natural Light beer a day -- used to be able to drink 15 to 24 beers a day. He reports this makes his ear ringing go away. Continues to smoke 1/2 to 1 PPD.  Not interested in quitting.  Denies any return to opioid use. GERD control status: stable Satisfied with current treatment? yes Heartburn frequency:  Medication side effects: no  Medication compliance: fluctuating Previous GERD medications: Antacid use frequency:   Duration:  Nature:  Location:  Heartburn duration:  Alleviatiating factors:   Aggravating factors:  Dysphagia: no Odynophagia:  no Hematemesis: no Blood in stool: no EGD: yes   COPD Uses Combivent PRN, Stiolto, and Albuterol as needed.  Had last lung screening on 10/10/20 -- noting emphysema and aortic atherosclerosis. Satisfied with current treatment?: yes Oxygen use: no Dyspnea frequency: minimal Cough frequency:  none Rescue inhaler frequency:   Limitation of activity: no Productive cough: none Last Spirometry: unknown Pneumovax: Not up to Date Influenza: Up to Date   HYPERTENSION Saw cardiology on 05/18/21 at [facility] (Dr. Smith Mince) -- they decreased Clonidine to 0.1 MG at bedtime and started Olmesartan/HCTZ 20/12.5 MG -- they wanted to start Norvasc/Olmesartan but did not do this due to past issues with Amlodipine.  Is to return to see them in 3 weeks. Cardiology performed myoview in past and this showed no ischemia on stress test.  Sees Dr. Smith Mince again tomorrow.   Underlying chronic right basal ganglia lacunar infarct on imaging and continues to complain of ongoing tinnitus and intermittent dizziness -- sees ENT & neuro for this and last saw Dr. Melrose Nakayama 03/08/21.   Vascular performed duplex ultrasound which showed widely patent RICA and 92% LICA stenosis + chronic right basal ganglia lacunar infarct, to return in 12 months (02/01/22).     Had echo performed 11/18/19 -- EF 60-65% and no left ventricular hypertrophy.   Hypertension status: stable  Satisfied with current treatment? yes Duration of hypertension: chronic BP monitoring frequency:  not checking BP range:  BP medication side effects:  no Medication compliance: good compliance Previous BP meds:has tried multiple medications and had ADR  Aspirin: no Recurrent headaches: no Visual changes: no Palpitations: no Dyspnea: no Chest  pain: no Lower extremity edema: no Dizzy/lightheaded: no   Relevant past medical, surgical, family and social history reviewed and updated as indicated. Interim medical history since our last visit reviewed. Allergies and medications reviewed and updated.  Review of Systems  Constitutional:  Positive for fatigue. Negative for activity change, chills, diaphoresis and fever.  Respiratory:  Negative for cough, chest tightness, shortness of breath and wheezing.   Cardiovascular:  Negative for chest pain, palpitations and  leg swelling.  Gastrointestinal:  Positive for abdominal distention and constipation. Negative for abdominal pain, blood in stool, diarrhea, nausea, rectal pain and vomiting.  Endocrine: Negative for cold intolerance, heat intolerance, polydipsia, polyphagia and polyuria.  Neurological:  Positive for dizziness. Negative for syncope, weakness, light-headedness, numbness and headaches.  Psychiatric/Behavioral: Negative.     Per HPI unless specifically indicated above     Objective:    BP (!) 140/93   Pulse 79   Temp 98.5 F (36.9 C) (Oral)   Wt 192 lb 6.4 oz (87.3 kg)   SpO2 98%   BMI 27.61 kg/m   Wt Readings from Last 3 Encounters:  06/28/21 192 lb 6.4 oz (87.3 kg)  06/05/21 188 lb 12.8 oz (85.6 kg)  04/05/21 192 lb (87.1 kg)    Physical Exam Vitals and nursing note reviewed.  Constitutional:      General: He is awake. He is not in acute distress.    Appearance: He is well-developed and well-groomed. He is not ill-appearing or toxic-appearing.  HENT:     Head: Normocephalic and atraumatic.     Right Ear: Hearing normal. No drainage.     Left Ear: Hearing normal. No drainage.  Eyes:     General: Lids are normal.        Right eye: No discharge.        Left eye: No discharge.     Conjunctiva/sclera: Conjunctivae normal.     Pupils: Pupils are equal, round, and reactive to light.  Neck:     Thyroid: No thyromegaly.     Vascular: No carotid bruit.  Cardiovascular:     Rate and Rhythm: Normal rate and regular rhythm.     Heart sounds: Normal heart sounds, S1 normal and S2 normal. No murmur heard.   No gallop.  Pulmonary:     Effort: Pulmonary effort is normal. No accessory muscle usage or respiratory distress.     Breath sounds: Normal breath sounds.  Abdominal:     General: Bowel sounds are normal. There is distension (very mild).     Palpations: Abdomen is soft. There is no fluid wave, hepatomegaly or mass.     Tenderness: There is abdominal tenderness in the epigastric  area. There is no right CVA tenderness, left CVA tenderness, guarding or rebound. Negative signs include Murphy's sign and McBurney's sign.     Hernia: No hernia is present.  Musculoskeletal:        General: Normal range of motion.     Cervical back: Normal range of motion and neck supple.     Right lower leg: No edema.     Left lower leg: No edema.  Skin:    General: Skin is warm and dry.     Capillary Refill: Capillary refill takes less than 2 seconds.     Findings: No rash.  Neurological:     Mental Status: He is alert and oriented to person, place, and time.     Cranial Nerves: Cranial nerves are intact.     Coordination:  Coordination is intact.     Gait: Gait is intact.     Deep Tendon Reflexes: Reflexes are normal and symmetric.     Reflex Scores:      Brachioradialis reflexes are 2+ on the right side and 2+ on the left side.      Patellar reflexes are 2+ on the right side and 2+ on the left side. Psychiatric:        Attention and Perception: Attention normal.        Mood and Affect: Mood normal.        Speech: Speech normal.        Behavior: Behavior normal. Behavior is cooperative.        Thought Content: Thought content normal.    Results for orders placed or performed in visit on 06/05/21  Comprehensive metabolic panel  Result Value Ref Range   Glucose 82 65 - 99 mg/dL   BUN 11 6 - 24 mg/dL   Creatinine, Ser 0.93 0.76 - 1.27 mg/dL   eGFR 96 >59 mL/min/1.73   BUN/Creatinine Ratio 12 9 - 20   Sodium 135 134 - 144 mmol/L   Potassium 4.3 3.5 - 5.2 mmol/L   Chloride 96 96 - 106 mmol/L   CO2 23 20 - 29 mmol/L   Calcium 9.3 8.7 - 10.2 mg/dL   Total Protein 7.2 6.0 - 8.5 g/dL   Albumin 4.3 3.8 - 4.9 g/dL   Globulin, Total 2.9 1.5 - 4.5 g/dL   Albumin/Globulin Ratio 1.5 1.2 - 2.2   Bilirubin Total 0.7 0.0 - 1.2 mg/dL   Alkaline Phosphatase 84 44 - 121 IU/L   AST 19 0 - 40 IU/L   ALT 19 0 - 44 IU/L  CBC with Differential/Platelet  Result Value Ref Range   WBC 10.8  3.4 - 10.8 x10E3/uL   RBC 5.43 4.14 - 5.80 x10E6/uL   Hemoglobin 16.6 13.0 - 17.7 g/dL   Hematocrit 49.1 37.5 - 51.0 %   MCV 90 79 - 97 fL   MCH 30.6 26.6 - 33.0 pg   MCHC 33.8 31.5 - 35.7 g/dL   RDW 12.1 11.6 - 15.4 %   Platelets 342 150 - 450 x10E3/uL   Neutrophils 65 Not Estab. %   Lymphs 26 Not Estab. %   Monocytes 6 Not Estab. %   Eos 2 Not Estab. %   Basos 1 Not Estab. %   Neutrophils Absolute 7.1 (H) 1.4 - 7.0 x10E3/uL   Lymphocytes Absolute 2.8 0.7 - 3.1 x10E3/uL   Monocytes Absolute 0.7 0.1 - 0.9 x10E3/uL   EOS (ABSOLUTE) 0.2 0.0 - 0.4 x10E3/uL   Basophils Absolute 0.1 0.0 - 0.2 x10E3/uL   Immature Granulocytes 0 Not Estab. %   Immature Grans (Abs) 0.0 0.0 - 0.1 x10E3/uL  TSH  Result Value Ref Range   TSH 1.540 0.450 - 4.500 uIU/mL  Gamma GT  Result Value Ref Range   GGT 91 (H) 0 - 65 IU/L  Acetaminophen level  Result Value Ref Range   Acetaminophen (Tylenol), S None Detected 10 - 30 ug/mL  Amylase  Result Value Ref Range   Amylase 108 31 - 110 U/L  Lipase  Result Value Ref Range   Lipase 47 13 - 78 U/L  Acute Viral Hepatitis (HAV, HBV, HCV)  Result Value Ref Range   Hep A IgM Negative Negative   Hepatitis B Surface Ag Negative Negative   Hep B C IgM Negative Negative   HCV Ab 0.2 0.0 -  0.9 s/co ratio  Interpretation:  Result Value Ref Range   HCV Interp 1: Comment       Assessment & Plan:   Problem List Items Addressed This Visit       Cardiovascular and Mediastinum   Essential hypertension    Chronic, ongoing with BP improved for him today - suspect main exacerbation is his alcohol intake. Continue current medication regimen as prescribed by Dr. Smith Mince at [facility] and continue this collaboration.  His continued heavy alcohol use, continues to be a difficulty in maintaining and controlling BP level.  Recommend cutting back on alcohol and smoking.  Continue collaboration with cardiology. CMP today, will have for his visit with Dr. Smith Mince tomorrow.         Aortic atherosclerosis ([place])    This was noticed on November 2020 lung CT.  Continue statin therapy + recommend complete cessation of smoking.  Lipid next visit.      Carotid stenosis    Continue collaboration with vascular on yearly basis, to return 02/01/22.        Respiratory   Paraseptal emphysema (HCC) - Primary    Chronic, ongoing.  Continue current inhaler regimen at this time and recommend complete cessation of smoking.  Continue yearly lung screening.  Consider pulmonary referral in future.        Digestive   GERD (gastroesophageal reflux disease)    Chronic, ongoing.  Continue Omeprazole daily and adjust as needed.  Continues to have ongoing abdominal pain with tenderness to abdominal area.  Repeat CMP, GGT, CBC today. Referral to GI for further assessment in this complicated case.  Recent Hep labs negative. ?related to alcohol intake or recent withdrawal from opioids.        Nervous and Auditory   Nicotine dependence, cigarettes, w unsp disorders    I have recommended complete cessation of tobacco use. I have discussed various options available for assistance with tobacco cessation including over the counter methods (Nicotine gum, patch and lozenges). We also discussed prescription options (Chantix, Nicotine Inhaler / Nasal Spray). The patient is not interested in pursuing any prescription tobacco cessation options at this time.       Opioid withdrawal ([place])    Has quit taking opioids, at this time continues to remain off these per report -- monitor closely.  ?some of current symptoms related to withdrawal, as was long time user of opioids.        Other   Alcohol dependence ([place])    Recommend cutting back -- concern for overall health with ongoing heavy use.  He is pondering rehab, highly recommend he go to [facility] and spend time in detox program.  Is aware of effect of heavy alcohol use on overall health, especially BP.        History of lacunar cerebrovascular accident     History of infarct, continue collaboration with neuro and ENT for ongoing tinnitus, headaches, and dizziness.        Mixed hyperlipidemia    Chronic, ongoing.  Continue current medication regimen and adjust as needed.  Lipid panel next visit.       Fatigue    Ongoing issue, refer to GERD plan of care.      Relevant Orders   CBC with Differential/Platelet   Comprehensive metabolic panel   Gamma GT   Novel Coronavirus, NAA ([facility])   Ambulatory referral to Gastroenterology   Other Visit Diagnoses     Epigastric pain       Refer to GERD  plan of care.   Relevant Orders   CBC with Differential/Platelet   Comprehensive metabolic panel   Gamma GT   Ambulatory referral to Gastroenterology   Pneumococcal vaccination indicated       PPSV23 today in office, discussed with patient.   Relevant Orders   Pneumococcal polysaccharide vaccine 23-valent greater than or equal to 2yo subcutaneous/IM   Encounter for screening for COVID-19       Will obtain test today per patient request.        Follow up plan: Return in about 4 weeks (around 07/26/2021) for Abdominal pain and fatigue.

## 2021-06-28 NOTE — Assessment & Plan Note (Signed)
Chronic, ongoing with BP improved for him today - suspect main exacerbation is his alcohol intake. Continue current medication regimen as prescribed by Dr. Smith Mince at [facility] and continue this collaboration.  His continued heavy alcohol use, continues to be a difficulty in maintaining and controlling BP level.  Recommend cutting back on alcohol and smoking.  Continue collaboration with cardiology. CMP today, will have for his visit with Dr. Smith Mince tomorrow.

## 2021-06-28 NOTE — Assessment & Plan Note (Signed)
Continue collaboration with vascular on yearly basis, to return 02/01/22.

## 2021-06-28 NOTE — Assessment & Plan Note (Signed)
Recommend cutting back -- concern for overall health with ongoing heavy use.  He is pondering rehab, highly recommend he go to [facility] and spend time in detox program.  Is aware of effect of heavy alcohol use on overall health, especially BP.

## 2021-06-28 NOTE — Assessment & Plan Note (Signed)
Ongoing issue, refer to GERD plan of care.

## 2021-06-28 NOTE — Assessment & Plan Note (Signed)
Has quit taking opioids, at this time continues to remain off these per report -- monitor closely.  ?some of current symptoms related to withdrawal, as was long time user of opioids.

## 2021-06-28 NOTE — Assessment & Plan Note (Signed)
Chronic, ongoing.  Continue current medication regimen and adjust as needed.  Lipid panel next visit.    

## 2021-06-28 NOTE — Patient Instructions (Signed)
Abdominal Pain, Adult Many things can cause belly (abdominal) pain. Most times, belly pain is not dangerous. Many cases of belly pain can be watched and treated at home. Sometimes, though, belly pain is serious. Yourdoctor will try to find the cause of your belly pain. Follow these instructions at home:  Medicines Take over-the-counter and prescription medicines only as told by your doctor. Do not take medicines that help you poop (laxatives) unless told by your doctor. General instructions Watch your belly pain for any changes. Drink enough fluid to keep your pee (urine) pale yellow. Keep all follow-up visits as told by your doctor. This is important. Contact a doctor if: Your belly pain changes or gets worse. You are not hungry, or you lose weight without trying. You are having trouble pooping (constipated) or have watery poop (diarrhea) for more than 2-3 days. You have pain when you pee or poop. Your belly pain wakes you up at night. Your pain gets worse with meals, after eating, or with certain foods. You are vomiting and cannot keep anything down. You have a fever. You have blood in your pee. Get help right away if: Your pain does not go away as soon as your doctor says it should. You cannot stop vomiting. Your pain is only in areas of your belly, such as the right side or the left lower part of the belly. You have bloody or black poop, or poop that looks like tar. You have very bad pain, cramping, or bloating in your belly. You have signs of not having enough fluid or water in your body (dehydration), such as: Dark pee, very little pee, or no pee. Cracked lips. Dry mouth. Sunken eyes. Sleepiness. Weakness. You have trouble breathing or chest pain. Summary Many cases of belly pain can be watched and treated at home. Watch your belly pain for any changes. Take over-the-counter and prescription medicines only as told by your doctor. Contact a doctor if your belly pain  changes or gets worse. Get help right away if you have very bad pain, cramping, or bloating in your belly. This information is not intended to replace advice given to you by your health care provider. Make sure you discuss any questions you have with your healthcare provider. Document Revised: 03/15/2019 Document Reviewed: 03/15/2019 Elsevier Patient Education  2022 Elsevier Inc.  

## 2021-06-28 NOTE — Assessment & Plan Note (Addendum)
This was noticed on November 2020 lung CT.  Continue statin therapy + recommend complete cessation of smoking.  Lipid next visit.

## 2021-06-29 LAB — COMPREHENSIVE METABOLIC PANEL
ALT: 34 IU/L (ref 0–44)
AST: 27 IU/L (ref 0–40)
Albumin/Globulin Ratio: 1.4 (ref 1.2–2.2)
Albumin: 4.4 g/dL (ref 3.8–4.9)
Alkaline Phosphatase: 104 IU/L (ref 44–121)
BUN/Creatinine Ratio: 11 (ref 9–20)
BUN: 12 mg/dL (ref 6–24)
Bilirubin Total: 0.5 mg/dL (ref 0.0–1.2)
CO2: 24 mmol/L (ref 20–29)
Calcium: 9.9 mg/dL (ref 8.7–10.2)
Chloride: 96 mmol/L (ref 96–106)
Creatinine, Ser: 1.12 mg/dL (ref 0.76–1.27)
Globulin, Total: 3.2 g/dL (ref 1.5–4.5)
Glucose: 88 mg/dL (ref 65–99)
Potassium: 4.5 mmol/L (ref 3.5–5.2)
Sodium: 135 mmol/L (ref 134–144)
Total Protein: 7.6 g/dL (ref 6.0–8.5)
eGFR: 77 mL/min/{1.73_m2} (ref >59)

## 2021-06-29 LAB — CBC WITH DIFFERENTIAL/PLATELET
Basophils Absolute: 0.1 10*3/uL (ref 0.0–0.2)
Basos: 1 %
EOS (ABSOLUTE): 0.1 10*3/uL (ref 0.0–0.4)
Eos: 1 %
Hematocrit: 46.6 % (ref 37.5–51.0)
Hemoglobin: 15.8 g/dL (ref 13.0–17.7)
Immature Grans (Abs): 0 10*3/uL (ref 0.0–0.1)
Immature Granulocytes: 0 %
Lymphocytes Absolute: 2.9 10*3/uL (ref 0.7–3.1)
Lymphs: 25 %
MCH: 30.4 pg (ref 26.6–33.0)
MCHC: 33.9 g/dL (ref 31.5–35.7)
MCV: 90 fL (ref 79–97)
Monocytes Absolute: 0.9 10*3/uL (ref 0.1–0.9)
Monocytes: 7 %
Neutrophils Absolute: 7.9 10*3/uL — ABNORMAL HIGH (ref 1.4–7.0)
Neutrophils: 66 %
Platelets: 371 10*3/uL (ref 150–450)
RBC: 5.2 x10E6/uL (ref 4.14–5.80)
RDW: 12.5 % (ref 11.6–15.4)
WBC: 11.9 10*3/uL — ABNORMAL HIGH (ref 3.4–10.8)

## 2021-06-29 LAB — GAMMA GT: GGT: 148 IU/L — ABNORMAL HIGH (ref 0–65)

## 2021-06-29 LAB — SARS-COV-2, NAA 2 DAY TAT

## 2021-06-29 LAB — NOVEL CORONAVIRUS, NAA: SARS-CoV-2, NAA: NOT DETECTED

## 2021-06-29 NOTE — Progress Notes (Signed)
Contacted via [place] evening [place]cker, your labs have returned: - Covid testing is negative - White blood cell is slightly up and neutrophils, will recheck this next visit. - Kidney function, creatinine and eGFR, remains normal.  Liver function, AST and ALT, is normal.  GGT is creeping up a little at 148, this at times looks at gall bladder.  We will see what your imaging shows.  Any questions? Keep being awesome!!  Thank you for allowing me to participate in your care.  I appreciate you. Kindest regards, Terita Hejl

## 2021-07-03 ENCOUNTER — Ambulatory Visit
Admission: RE | Admit: 2021-07-03 | Discharge: 2021-07-03 | Disposition: A | Discharge status: Home / Self Care | Payer: Medicaid Other | Source: Ambulatory Visit | Attending: Nurse Practitioner | Admitting: Nurse Practitioner

## 2021-07-03 ENCOUNTER — Other Ambulatory Visit: Payer: Self-pay

## 2021-07-03 DIAGNOSIS — K5909 Other constipation: Secondary | ICD-10-CM | POA: Diagnosis not present

## 2021-07-03 DIAGNOSIS — F1123 Opioid dependence with withdrawal: Secondary | ICD-10-CM | POA: Diagnosis present

## 2021-07-03 DIAGNOSIS — F1193 Opioid use, unspecified with withdrawal: Secondary | ICD-10-CM

## 2021-07-03 DIAGNOSIS — R5383 Other fatigue: Secondary | ICD-10-CM

## 2021-07-03 IMAGING — CT CT ABD-PELV W/O CM
2 of 4 series · 16 of 46 positions shown, 18 images · non-contrast
Comparison: [DATE]

CLINICAL DATA: Epigastric and right upper quadrant pain for 6
months. Nausea and vomiting and [REDACTED] stool for several days.

EXAM:
CT ABDOMEN AND PELVIS WITHOUT CONTRAST
TECHNIQUE: Multidetector CT imaging of the abdomen and pelvis was performed
following the standard protocol without IV contrast.

[Series 2: axials routine abdomen pelvis without 5.00 · axial · non-contrast · 0.70mm/px · z∈[-1560,-1115]mm · 13 of 97 slices shown, 15 images]
[im 4/97  soft-tissue]
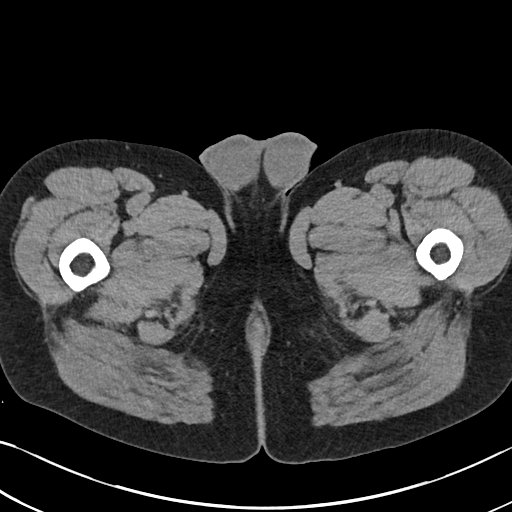
[im 4/97  bone]
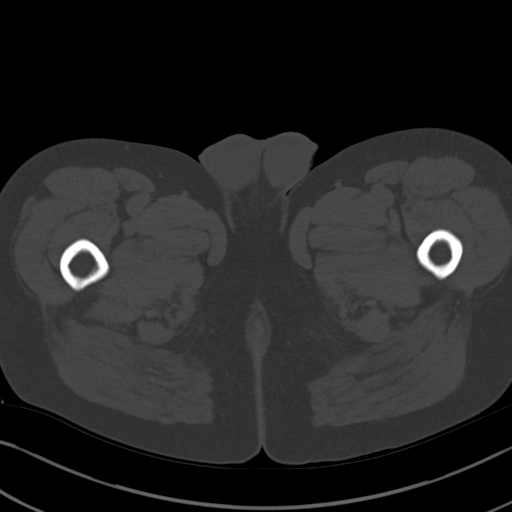
[im 12/97  soft-tissue]
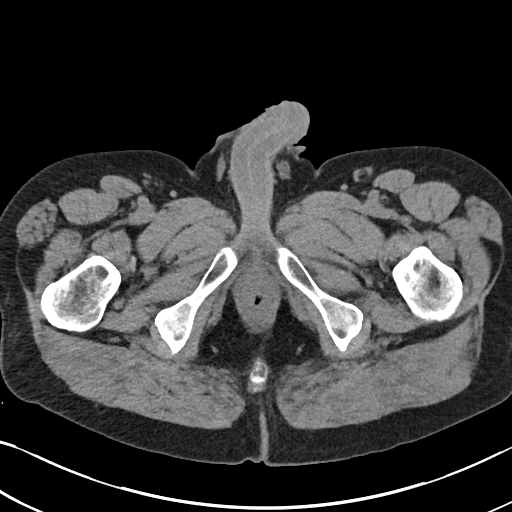
[im 20/97  soft-tissue]
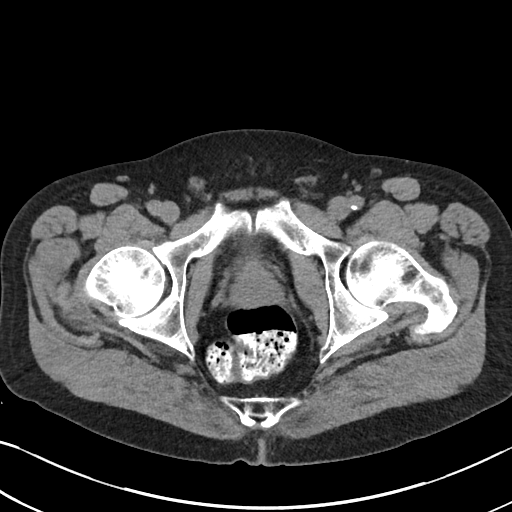
[im 27/97  soft-tissue]
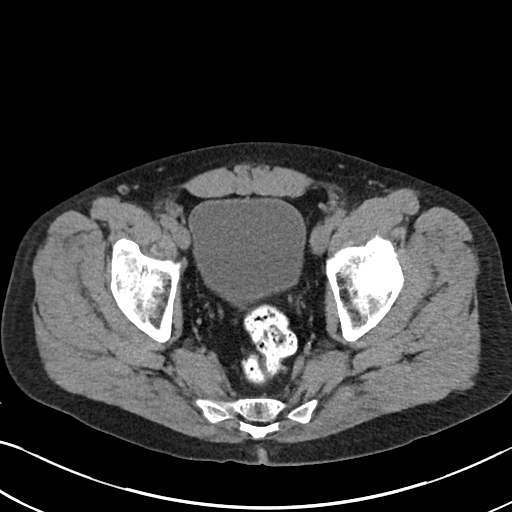
[im 35/97  soft-tissue]
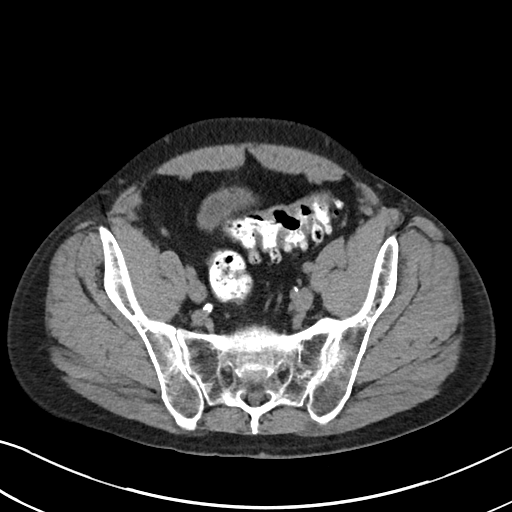
[im 43/97  soft-tissue]
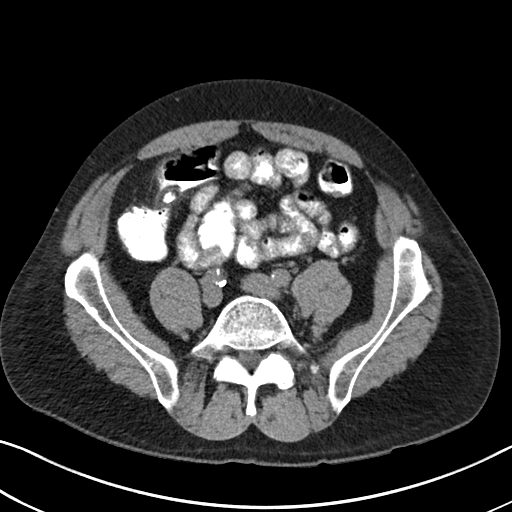
[im 50/97  soft-tissue]
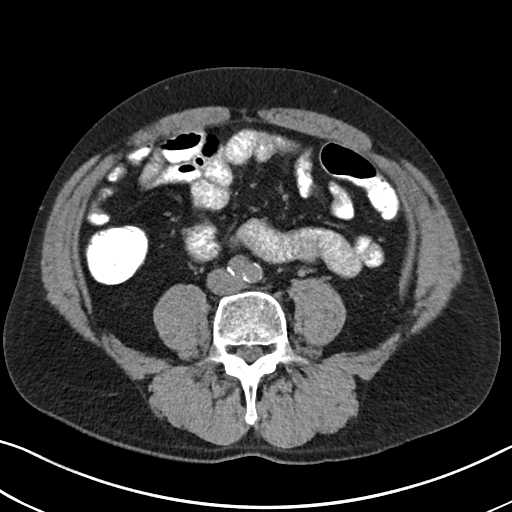
[im 54/97  soft-tissue]
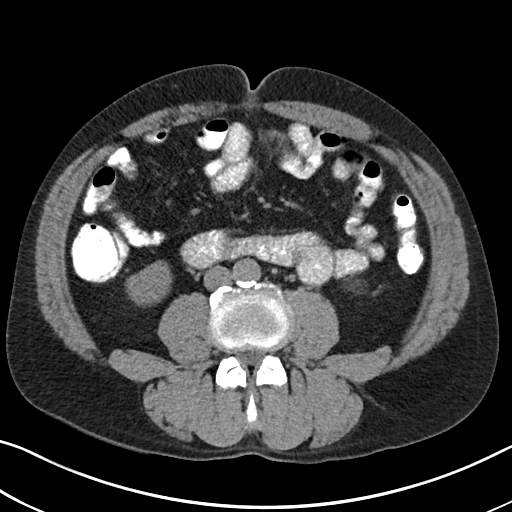
[im 62/97  soft-tissue]
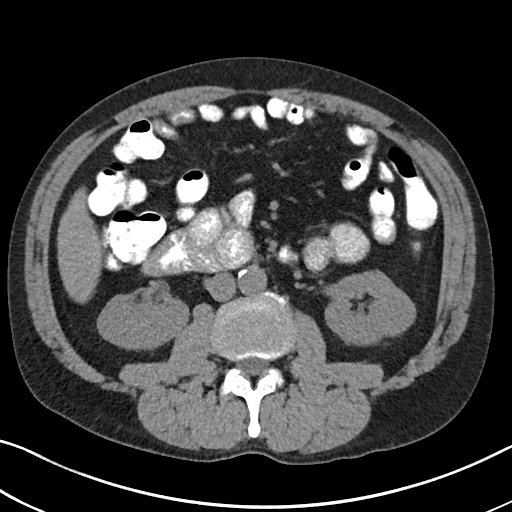
[im 62/97  bone]
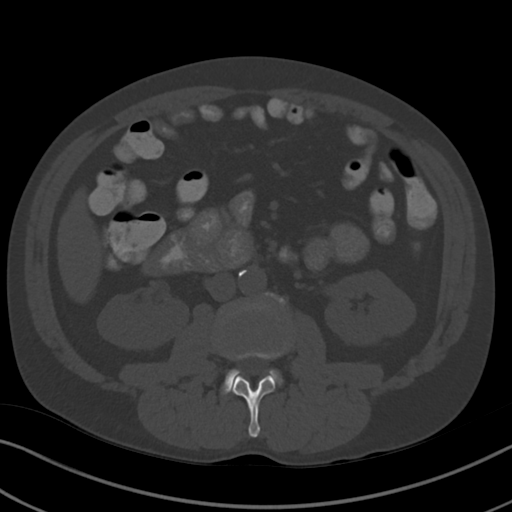
[im 70/97  soft-tissue]
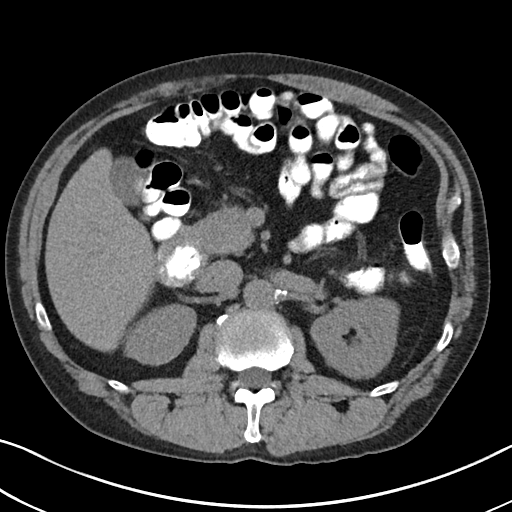
[im 77/97  soft-tissue]
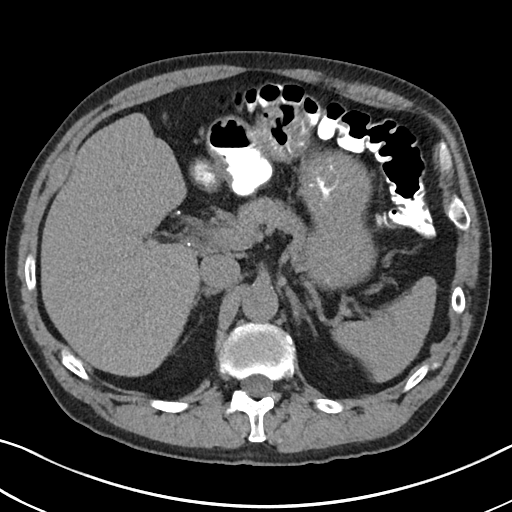
[im 85/97  soft-tissue]
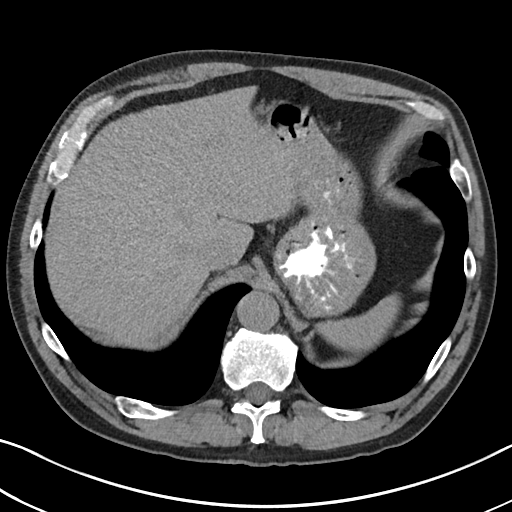
[im 93/97  soft-tissue]
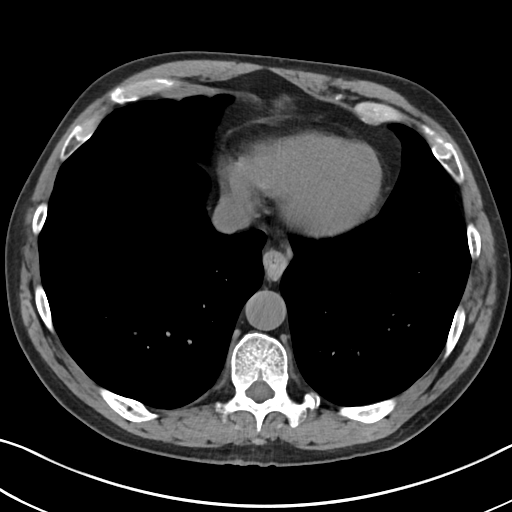

[Series 4: coronals routine abdomen pelvis without 2.00 cor · coronal · non-contrast · 0.70mm/px · 3 of 149 slices shown]
[im 50/149  soft-tissue]
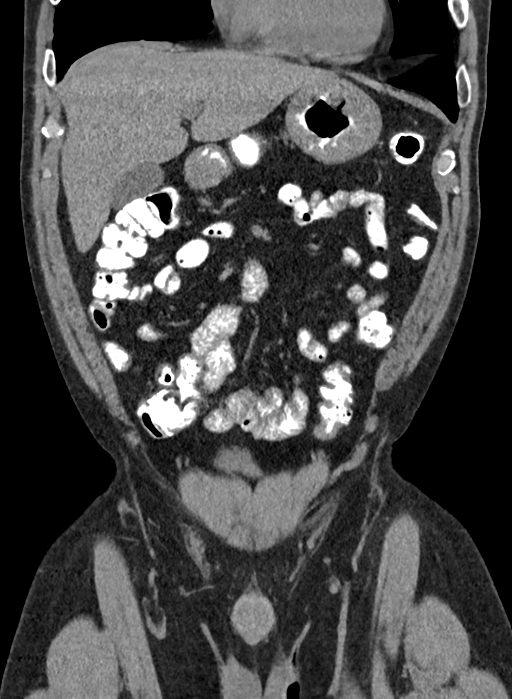
[im 66/149  soft-tissue]
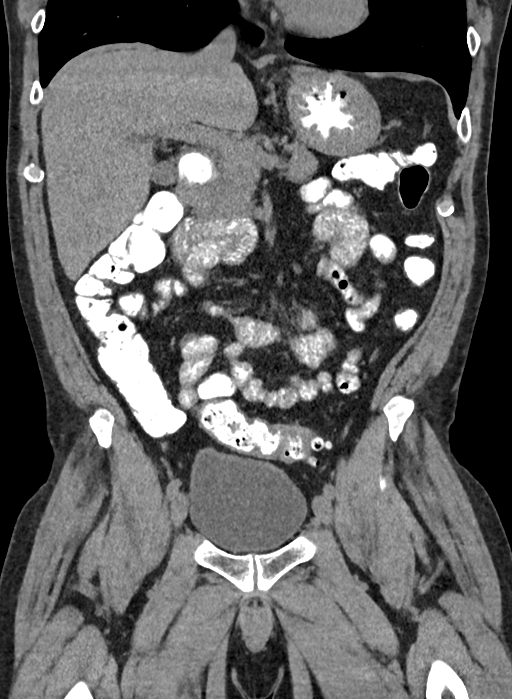
[im 83/149  soft-tissue]
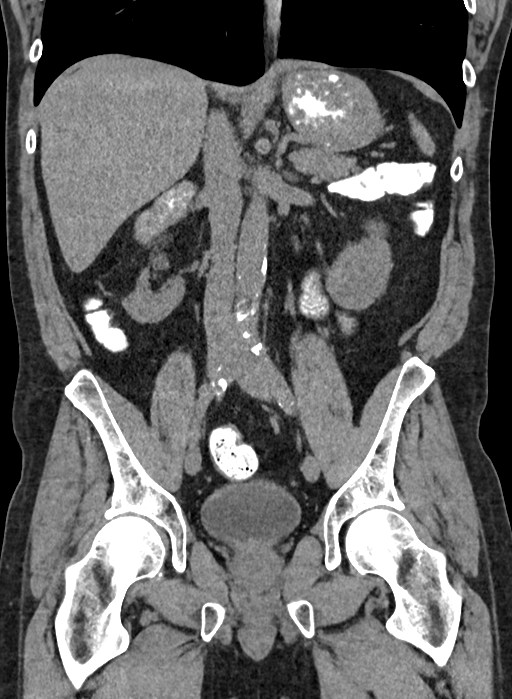

[16 of 46 positions shown; findings below may reference images not displayed]

FINDINGS: Lower chest: No acute findings.

Hepatobiliary: No mass visualized on this unenhanced exam.
Gallbladder is unremarkable. No evidence of biliary ductal
dilatation.

Pancreas: No mass or inflammatory process visualized on this
unenhanced exam.

Spleen:  Within normal limits in size.

Adrenals/Urinary tract: No evidence of urolithiasis or
hydronephrosis. Scarring is again seen involving lower pole of right
kidney. Unremarkable unopacified urinary bladder.

Stomach/Bowel: No evidence of obstruction, inflammatory process, or
abnormal fluid collections. Diverticulosis is seen mainly involving
the sigmoid colon, however there is no evidence of diverticulitis.

Vascular/Lymphatic: No pathologically enlarged lymph nodes
identified. No evidence of abdominal aortic aneurysm. Aortic
atherosclerotic calcification noted.

Reproductive:  No mass or other significant abnormality.

Other: Small paraumbilical ventral hernia again seen, which contains
only fat.

Musculoskeletal:  No suspicious bone lesions identified.
IMPRESSION: No acute findings.

Colonic diverticulosis, without radiographic evidence of
diverticulitis.

Stable small paraumbilical ventral hernia containing only fat.

Aortic Atherosclerosis ([IB]-[IB]).

## 2021-07-03 NOTE — Progress Notes (Signed)
Contacted via MyChart   Good evening Luke Briggs, your CT scan has returned and overall gallbladder and liver remain normal in appearance.  There are no kidney stones.  You do have a small hernia around belly button, but I do not think this is causing current symptoms.  If ongoing issues we could consider general surgery referral to look at this.  No significant findings to explain current symptoms.  We will see what GI says when you visit with them.  The referral is in and you should be hearing from [facility] soon, if you do not let me know.  Any questions? Keep being awesome!!  Thank you for allowing me to participate in your care.  I appreciate you. Kindest regards, Chong Wojdyla

## 2021-07-26 ENCOUNTER — Encounter: Payer: Self-pay | Admitting: Nurse Practitioner

## 2021-07-26 ENCOUNTER — Ambulatory Visit (INDEPENDENT_AMBULATORY_CARE_PROVIDER_SITE_OTHER): Payer: Medicaid Other | Admitting: Nurse Practitioner

## 2021-07-26 ENCOUNTER — Other Ambulatory Visit: Payer: Self-pay

## 2021-07-26 VITALS — BP 144/87 | HR 80 | Temp 98.6°F | Wt 196.6 lb

## 2021-07-26 DIAGNOSIS — F1123 Opioid dependence with withdrawal: Secondary | ICD-10-CM

## 2021-07-26 DIAGNOSIS — R748 Abnormal levels of other serum enzymes: Secondary | ICD-10-CM | POA: Diagnosis not present

## 2021-07-26 DIAGNOSIS — K219 Gastro-esophageal reflux disease without esophagitis: Secondary | ICD-10-CM

## 2021-07-26 DIAGNOSIS — F17219 Nicotine dependence, cigarettes, with unspecified nicotine-induced disorders: Secondary | ICD-10-CM

## 2021-07-26 DIAGNOSIS — F10288 Alcohol dependence with other alcohol-induced disorder: Secondary | ICD-10-CM | POA: Diagnosis not present

## 2021-07-26 DIAGNOSIS — G43709 Chronic migraine without aura, not intractable, without status migrainosus: Secondary | ICD-10-CM

## 2021-07-26 DIAGNOSIS — F1193 Opioid use, unspecified with withdrawal: Secondary | ICD-10-CM

## 2021-07-26 DIAGNOSIS — I10 Essential (primary) hypertension: Secondary | ICD-10-CM

## 2021-07-26 DIAGNOSIS — Z8673 Personal history of transient ischemic attack (TIA), and cerebral infarction without residual deficits: Secondary | ICD-10-CM

## 2021-07-26 DIAGNOSIS — R197 Diarrhea, unspecified: Secondary | ICD-10-CM | POA: Insufficient documentation

## 2021-07-26 MED ORDER — TOPIRAMATE 25 MG PO TABS: ORAL_TABLET | ORAL | 4 refills | Status: DC

## 2021-07-26 MED ORDER — NURTEC 75 MG PO TBDP: 75.0000 mg | ORAL_TABLET | Freq: Every day | ORAL | 0 refills | Status: DC | PRN

## 2021-07-26 NOTE — Assessment & Plan Note (Signed)
I have recommended complete cessation of tobacco use. I have discussed various options available for assistance with tobacco cessation including over the counter methods (Nicotine gum, patch and lozenges). We also discussed prescription options (Chantix, Nicotine Inhaler / Nasal Spray). The patient is not interested in pursuing any prescription tobacco cessation options at this time.  

## 2021-07-26 NOTE — Assessment & Plan Note (Signed)
Chronic, ongoing with BP improved for him today - suspect main exacerbation is his alcohol intake. Continue current medication regimen as prescribed by Dr. Smith Mince at [facility] and continue this collaboration.  Continued heavy alcohol use, continues to be a difficulty in maintaining and controlling BP level.  Recommend cutting back on alcohol and smoking.  Continue collaboration with cardiology. CMP today, will alert Dr. Smith Mince to results when completed.

## 2021-07-26 NOTE — Assessment & Plan Note (Signed)
Recheck CMP, CBC, GTT today -- may pursue ultrasound to further assess area, recent CT was negative for acute findings. 

## 2021-07-26 NOTE — Assessment & Plan Note (Signed)
Recommend cutting back -- concern for overall health with ongoing heavy use.  He is pondering rehab, highly recommend he go to [facility] and spend time in detox program.  Is aware of effect of heavy alcohol use on overall health, especially BP.

## 2021-07-26 NOTE — Assessment & Plan Note (Signed)
History of infarct, continue collaboration with neuro and ENT for ongoing tinnitus, headaches, and dizziness.

## 2021-07-26 NOTE — Assessment & Plan Note (Signed)
Refer to GERD plan of care, repeat GGT due to ongoing trend up recent labs, although recent imaging reassuring.  Will check on GI referral placed last visit as he reports not hearing from them to schedule, this would be beneficial visit.

## 2021-07-26 NOTE — Assessment & Plan Note (Signed)
Chronic, ongoing.  Continue Omeprazole daily and adjust as needed.  Continues to have ongoing abdominal pain with tenderness to abdominal area.  Repeat CMP, GGT, CBC today. Referral to GI placed recent visit, but has not heard from them.  Recent Hep labs negative. ?related to alcohol intake or recent withdrawal from opioids.

## 2021-07-26 NOTE — Assessment & Plan Note (Signed)
Chronic, ongoing issue.  Will trial Topamax 25 MG daily and if tolerating in one week increase to 50 MG daily -- this may also benefit in reducing his alcohol use.  For acute headache, will avoid triptans due to BP -- trial Nurtec as needed, no contraindication with BP for this.  Return in 8 weeks for follow-up, continue to collaborate with neurology as needed.

## 2021-07-26 NOTE — Patient Instructions (Signed)
Alcohol Abuse and Nutrition Alcohol abuse is any pattern of alcohol consumption that harms your health, relationships, or work. Alcohol abuse can cause poor nutrition (malnutrition or malnourishment) and a lack of nutrients (nutrient deficiencies), which can lead to more health problems. Alcohol abuse brings malnutrition and nutrient deficiencies in two ways: It causes your liver to work abnormally. This affects how your body divides (breaks down) and absorbs nutrients from food. It causes you to eat poorly. Many people who abuse alcohol do not eat enough carbohydrates, protein, fat, vitamins, and minerals. Nutrients that are commonly lacking (deficient) in people who abuse alcohol include: Vitamins. Vitamin A. This is needed for your vision, metabolism, and ability to fight off infections (immunity). B vitamins. These include folate, thiamine, and niacin. These are needed for new cell growth. Vitamin C. This plays an important role in wound healing, immunity, and helping your body to absorb iron. Vitamin D. This is necessary for your body to absorb and use calcium. It is produced by your liver, but you can also get it from food and from sun exposure. Minerals. Calcium. This is needed for healthy bones as well as heart and blood vessel (cardiovascular) function. Iron. This is important for blood, muscle, and nervous system functioning. Magnesium. This plays an important role in muscle and nerve function, and it helps to control blood sugar and blood pressure. Zinc. This is important for the normal functioning of your nervous system and digestive system (gastrointestinal tract). If you think that you have an alcohol dependency problem, or if it is hard to stop drinking because you feel sick or different when you do not use alcohol, talk with your health care provider or another health professional about where to get help. Nutrition is an essential factor in the therapy for alcohol abuse. Your health  care provider or diet and nutrition specialist (dietitian) will work with you to design a plan that can help to restore nutrients to your body and prevent the risk of complications. What is my plan? Your dietitian may develop a specific eating plan that is based on your condition and any other problems that you have. An eating plan will commonly include: A balanced diet. Grains: 6-8 oz (170-227 g) a day. Examples of 1 oz of whole grains include 1 cup of whole-wheat cereal,  cup of brown rice, or 1 slice of whole-wheat bread. Vegetables: 2-3 cups a day. Examples of 1 cup of vegetables include 2 medium carrots, 1 large tomato, or 2 stalks of celery. Fruits: 1-2 cups a day. Examples of 1 cup of fruit include 1 large banana, 1 small apple, 8 large strawberries, or 1 large orange. Meat and other protein: 5-6 oz (142-170 g) a day. A cut of meat or fish that is the size of a deck of cards is about 3-4 oz. Foods that provide 1 oz of protein include 1 egg,  cup of nuts or seeds, or 1 tablespoon (16 g) of peanut butter. Dairy: 2-3 cups a day. Examples of 1 cup of dairy include 8 oz (230 mL) of milk, 8 oz (230 g) of yogurt, or 1 oz (44 g) of natural cheese. Vitamin and mineral supplements. What are tips for following this plan? Eat frequent meals and snacks. Try to eat 5-6 small meals each day. Take vitamin or mineral supplements as recommended by your dietitian. If you are malnourished or if your dietitian recommends it: You may follow a high-protein, high-calorie diet. This may include: 2,000-3,000 calories (kilocalories) a day. 70-100  g (grams) of protein a day. You may be directed to follow a diet that includes a complete nutritional supplement beverage. This can help to restore calories, protein, and vitamins to your body. Depending on your condition, you may be advised to consume this beverage instead of your meals or in addition to them. Certain medicines may cause changes in your appetite, taste,  and weight. Work with your health care provider and dietitian to make any changes to your medicines and eating plan. If you are unable to take in enough food and calories by mouth, your health care provider may recommend a feeding tube. This tube delivers nutritional supplements directly to your stomach. Recommended foods Eat foods that are high in molecules that prevent oxygen from reacting with your food (antioxidants). These foods include grapes, berries, nuts, green tea, and dark green or orange vegetables. Eating these can help to prevent some of the stress that is placed on your liver by consuming alcohol. Eat a variety of fresh fruits and vegetables each day. This will help you to get fiber and vitamins in your diet. Drink plenty of water and other clear fluids, such as apple juice and broth. Try to drink at least 48-64 oz (1.5-2 L) of water a day. Include foods fortified with vitamins and minerals in your diet. Commonly fortified foods include milk, orange juice, cereal, and bread. Eat a variety of foods that are high in omega-3 and omega-6 fatty acids. These include fish, nuts and seeds, and soybeans. These foods may help your liver to recover and may also stabilize your mood. If you are a vegetarian: Eat a variety of protein-rich foods. Pair whole grains with plant-based proteins at meals and snack time. For example, eat rice with beans, put peanut butter on whole-grain toast, or eat oatmeal with sunflower seeds. The items listed above may not be a complete list of foods and beverages you can eat. Contact a dietitian for more information. Foods to avoid Avoid foods and drinks that are high in fat and sugar. Sugary drinks, salty snacks, and candy contain empty calories. This means that they lack important nutrients such as protein, fiber, and vitamins. Avoid alcohol. This is the best way to avoid malnutrition due to alcohol abuse. If you must drink, drink measured amounts. Measured drinking  means limiting your intake to no more than 1 drink a day for nonpregnant women and 2 drinks a day for men. One drink equals 12 oz (355 mL) of beer, 5 oz (148 mL) of wine, or 1 oz (44 mL) of hard liquor. Limit your intake of caffeine. Replace drinks like coffee and black tea with decaffeinated coffee and decaffeinated herbal tea. The items listed above may not be a complete list of foods and beverages you should avoid. Contact a dietitian for more information. Summary Alcohol abuse can cause poor nutrition (malnutrition or malnourishment) and a lack of nutrients (nutrient deficiencies), which can lead to more health problems. Common nutrient deficiencies include vitamin deficiencies (A, B, C, and D) and mineral deficiencies (calcium, iron, magnesium, and zinc). Nutrition is an essential factor in the therapy for alcohol abuse. Your health care provider and dietitian can help you to develop a specific eating plan that includes a balanced diet plus vitamin and mineral supplements. This information is not intended to replace advice given to you by your health care provider. Make sure you discuss any questions you have with your health care provider. Document Revised: 09/25/2020 Document Reviewed: 09/25/2020 Elsevier Patient Education  2022  [employer].

## 2021-07-26 NOTE — Progress Notes (Signed)
BP (!) 144/87   Pulse 80   Temp 98.6 F (37 C) (Oral)   Wt 196 lb 9.6 oz (89.2 kg)   SpO2 96%   BMI 28.21 kg/m    Subjective:    Patient ID: Luke Briggs., male    DOB: January 19, 1961, 60 y.o.   MRN: [id number]  HPI: Luke Briggs. is a 60 y.o. male  Chief Complaint  Patient presents with   Fatigue   Abdominal Pain    Patient states he is feeling a little better, but having some issues with diarrhea to where he has had accidents on himself. Patient states if he drinks a beer or anything he has a episode of diarrhea.    Migraine    Patient states he is noticing migraines every day for the past year and states it will cause ringing in his ears and would like to discuss possibly starting medication for his migraines. Patient states he tried Tylenol and it does not help at all.    GERD Continues on Omeprazole for GERD and is tolerating this well.  \ Has ongoing abdominal pain and fatigue, has recent CT scan on 07/03/21 which showed no acute findings -- his recent labs were unremarkable with exception of GGT at 148.  Did have colonoscopy 12/14/20 with Dr. Vicente Males that showed polyps and he is to have repeat in 3 years.   He reports his bowel pattern is more diarrhea with BM ten times a day every day for 2 weeks.  Does not strain, often his stool is more liquid.  He does have some nausea with this.  Continues to drink  daily, has cut back to 6-10 Natural Light beer a day -- used to be able to drink 15 to 24 beers a day. Not drinking every day. He reports this makes his ear ringing go away. Continues to smoke 1/2 to 1 PPD.  Not interested in quitting.  Denies any return to opioid use. GERD control status: stable Satisfied with current treatment? yes Heartburn frequency:  Medication side effects: no  Medication compliance: fluctuating Previous GERD medications: Antacid use frequency:   Duration:  Nature:  Location:  Heartburn duration:  Alleviatiating factors:   Aggravating  factors:  Dysphagia: no Odynophagia:  no Hematemesis: no Blood in stool: no EGD: yes   MIGRAINES Duration: chronic Onset: gradual Severity: 10/10 Quality: dull, aching, and throbbing Frequency: intermittent Location: temples Headache duration: 24 hours until he drinks a beer -- this makes it go away Radiation: no Time of day headache occurs: varies Alleviating factors: nothing Aggravating factors: unknown Headache status at time of visit: current headache Treatments attempted: Treatments attempted: APAP   Aura: no Nausea:  yes Vomiting: yes Photophobia:  no Phonophobia:  yes Effect on social functioning:  yes Numbers of missed days of school/work each month: none Confusion:  no Gait disturbance/ataxia:  no Behavioral changes:  no Fevers:  no    HYPERTENSION Saw cardiology on 07/17/21 at [facility] (Dr. Smith Mince).  Cardiology performed myoview in past and this showed no ischemia on stress test.     Underlying chronic right basal ganglia lacunar infarct on imaging and continues to complain of ongoing tinnitus and intermittent dizziness -- sees ENT & neuro for this and last saw Dr. Melrose Nakayama 03/08/21.   Vascular performed duplex ultrasound which showed widely patent RICA and 01% LICA stenosis + chronic right basal ganglia lacunar infarct, to return in 12 months (02/01/22).     Had echo performed 11/18/19 --  EF 60-65% and no left ventricular hypertrophy.   Hypertension status: stable  Satisfied with current treatment? yes Duration of hypertension: chronic BP monitoring frequency:  not checking BP range:  BP medication side effects:  no Medication compliance: good compliance Previous BP meds:has tried multiple medications and had ADR  Aspirin: no Recurrent headaches: no Visual changes: no Palpitations: no Dyspnea: no Chest pain: no Lower extremity edema: no Dizzy/lightheaded: no   Relevant past medical, surgical, family and social history reviewed and updated as indicated.  Interim medical history since our last visit reviewed. Allergies and medications reviewed and updated.  Review of Systems  Constitutional:  Positive for fatigue. Negative for activity change, chills, diaphoresis and fever.  Respiratory:  Negative for cough, chest tightness, shortness of breath and wheezing.   Cardiovascular:  Negative for chest pain, palpitations and leg swelling.  Gastrointestinal:  Positive for abdominal distention and constipation. Negative for abdominal pain, blood in stool, diarrhea, nausea, rectal pain and vomiting.  Endocrine: Negative for cold intolerance, heat intolerance, polydipsia, polyphagia and polyuria.  Neurological:  Positive for dizziness. Negative for syncope, weakness, light-headedness, numbness and headaches.  Psychiatric/Behavioral: Negative.     Per HPI unless specifically indicated above     Objective:    BP (!) 144/87   Pulse 80   Temp 98.6 F (37 C) (Oral)   Wt 196 lb 9.6 oz (89.2 kg)   SpO2 96%   BMI 28.21 kg/m   Wt Readings from Last 3 Encounters:  07/26/21 196 lb 9.6 oz (89.2 kg)  06/28/21 192 lb 6.4 oz (87.3 kg)  06/05/21 188 lb 12.8 oz (85.6 kg)    Physical Exam Vitals and nursing note reviewed.  Constitutional:      General: He is awake. He is not in acute distress.    Appearance: He is well-developed and well-groomed. He is not ill-appearing or toxic-appearing.  HENT:     Head: Normocephalic and atraumatic.     Right Ear: Hearing normal. No drainage.     Left Ear: Hearing normal. No drainage.  Eyes:     General: Lids are normal.        Right eye: No discharge.        Left eye: No discharge.     Conjunctiva/sclera: Conjunctivae normal.     Pupils: Pupils are equal, round, and reactive to light.  Neck:     Thyroid: No thyromegaly.     Vascular: No carotid bruit.  Cardiovascular:     Rate and Rhythm: Normal rate and regular rhythm.     Heart sounds: Normal heart sounds, S1 normal and S2 normal. No murmur heard.   No  gallop.  Pulmonary:     Effort: Pulmonary effort is normal. No accessory muscle usage or respiratory distress.     Breath sounds: Normal breath sounds.  Abdominal:     General: Bowel sounds are normal. There is no distension.     Palpations: Abdomen is soft. There is no fluid wave, hepatomegaly or mass.     Tenderness: There is generalized abdominal tenderness. There is no right CVA tenderness, left CVA tenderness, guarding or rebound. Negative signs include Murphy's sign and McBurney's sign.     Hernia: No hernia is present.  Musculoskeletal:        General: Normal range of motion.     Cervical back: Normal range of motion and neck supple.     Right lower leg: No edema.     Left lower leg: No edema.  Skin:  General: Skin is warm and dry.     Capillary Refill: Capillary refill takes less than 2 seconds.     Findings: No rash.  Neurological:     Mental Status: He is alert and oriented to person, place, and time.     Cranial Nerves: Cranial nerves are intact.     Motor: Motor function is intact.     Coordination: Coordination is intact.     Gait: Gait is intact.     Deep Tendon Reflexes: Reflexes are normal and symmetric.     Reflex Scores:      Brachioradialis reflexes are 2+ on the right side and 2+ on the left side.      Patellar reflexes are 2+ on the right side and 2+ on the left side. Psychiatric:        Attention and Perception: Attention normal.        Mood and Affect: Mood normal.        Speech: Speech normal.        Behavior: Behavior normal. Behavior is cooperative.        Thought Content: Thought content normal.    Results for orders placed or performed in visit on 06/28/21  Novel Coronavirus, NAA ([facility])   Specimen: Nasopharyngeal(NP) swabs in vial transport medium  Result Value Ref Range   SARS-CoV-2, NAA Not Detected Not Detected  SARS-COV-2, NAA 2 DAY TAT  Result Value Ref Range   SARS-CoV-2, NAA 2 DAY TAT Performed   CBC with Differential/Platelet   Result Value Ref Range   WBC 11.9 (H) 3.4 - 10.8 x10E3/uL   RBC 5.20 4.14 - 5.80 x10E6/uL   Hemoglobin 15.8 13.0 - 17.7 g/dL   Hematocrit 46.6 37.5 - 51.0 %   MCV 90 79 - 97 fL   MCH 30.4 26.6 - 33.0 pg   MCHC 33.9 31.5 - 35.7 g/dL   RDW 12.5 11.6 - 15.4 %   Platelets 371 150 - 450 x10E3/uL   Neutrophils 66 Not Estab. %   Lymphs 25 Not Estab. %   Monocytes 7 Not Estab. %   Eos 1 Not Estab. %   Basos 1 Not Estab. %   Neutrophils Absolute 7.9 (H) 1.4 - 7.0 x10E3/uL   Lymphocytes Absolute 2.9 0.7 - 3.1 x10E3/uL   Monocytes Absolute 0.9 0.1 - 0.9 x10E3/uL   EOS (ABSOLUTE) 0.1 0.0 - 0.4 x10E3/uL   Basophils Absolute 0.1 0.0 - 0.2 x10E3/uL   Immature Granulocytes 0 Not Estab. %   Immature Grans (Abs) 0.0 0.0 - 0.1 x10E3/uL  Comprehensive metabolic panel  Result Value Ref Range   Glucose 88 65 - 99 mg/dL   BUN 12 6 - 24 mg/dL   Creatinine, Ser 1.12 0.76 - 1.27 mg/dL   eGFR 77 >59 mL/min/1.73   BUN/Creatinine Ratio 11 9 - 20   Sodium 135 134 - 144 mmol/L   Potassium 4.5 3.5 - 5.2 mmol/L   Chloride 96 96 - 106 mmol/L   CO2 24 20 - 29 mmol/L   Calcium 9.9 8.7 - 10.2 mg/dL   Total Protein 7.6 6.0 - 8.5 g/dL   Albumin 4.4 3.8 - 4.9 g/dL   Globulin, Total 3.2 1.5 - 4.5 g/dL   Albumin/Globulin Ratio 1.4 1.2 - 2.2   Bilirubin Total 0.5 0.0 - 1.2 mg/dL   Alkaline Phosphatase 104 44 - 121 IU/L   AST 27 0 - 40 IU/L   ALT 34 0 - 44 IU/L  Gamma GT  Result Value Ref  Range   GGT 148 (H) 0 - 65 IU/L      Assessment & Plan:   Problem List Items Addressed This Visit       Cardiovascular and Mediastinum   Essential hypertension    Chronic, ongoing with BP improved for him today - suspect main exacerbation is his alcohol intake. Continue current medication regimen as prescribed by Dr. Smith Mince at [facility] and continue this collaboration.  Continued heavy alcohol use, continues to be a difficulty in maintaining and controlling BP level.  Recommend cutting back on alcohol and smoking.  Continue  collaboration with cardiology. CMP today, will alert Dr. Smith Mince to results when completed.      Migraines    Chronic, ongoing issue.  Will trial Topamax 25 MG daily and if tolerating in one week increase to 50 MG daily -- this may also benefit in reducing his alcohol use.  For acute headache, will avoid triptans due to BP -- trial Nurtec as needed, no contraindication with BP for this.  Return in 8 weeks for follow-up, continue to collaborate with neurology as needed.      Relevant Medications   topiramate (TOPAMAX) 25 MG tablet   Rimegepant Sulfate (NURTEC) 75 MG TBDP     Digestive   GERD (gastroesophageal reflux disease)    Chronic, ongoing.  Continue Omeprazole daily and adjust as needed.  Continues to have ongoing abdominal pain with tenderness to abdominal area.  Repeat CMP, GGT, CBC today. Referral to GI placed recent visit, but has not heard from them.  Recent Hep labs negative. ?related to alcohol intake or recent withdrawal from opioids.        Nervous and Auditory   Nicotine dependence, cigarettes, w unsp disorders    I have recommended complete cessation of tobacco use. I have discussed various options available for assistance with tobacco cessation including over the counter methods (Nicotine gum, patch and lozenges). We also discussed prescription options (Chantix, Nicotine Inhaler / Nasal Spray). The patient is not interested in pursuing any prescription tobacco cessation options at this time.       Opioid withdrawal ([place])    Has quit taking opioids, at this time continues to remain off these per report -- monitor closely.  ?some of current symptoms related to withdrawal, as was long time user of opioids.        Other   Alcohol dependence ([place]) - Primary    Recommend cutting back -- concern for overall health with ongoing heavy use.  He is pondering rehab, highly recommend he go to [facility] and spend time in detox program.  Is aware of effect of heavy alcohol use on overall  health, especially BP.        History of lacunar cerebrovascular accident    History of infarct, continue collaboration with neuro and ENT for ongoing tinnitus, headaches, and dizziness.        Elevated serum GGT level    Recheck CMP, CBC, GTT today -- may pursue ultrasound to further assess area, recent CT was negative for acute findings.      Relevant Orders   Comprehensive metabolic panel   Gamma GT   CBC with Differential/Platelet   Diarrhea    Refer to GERD plan of care, repeat GGT due to ongoing trend up recent labs, although recent imaging reassuring.  Will check on GI referral placed last visit as he reports not hearing from them to schedule, this would be beneficial visit.      Relevant Orders  Cdiff NAA+O+P+Stool Culture     Follow up plan: Return in about 8 weeks (around 09/20/2021) for ABDOMINAL PAIN AND MIGRAINES.

## 2021-07-26 NOTE — Assessment & Plan Note (Signed)
Has quit taking opioids, at this time continues to remain off these per report -- monitor closely.  ?some of current symptoms related to withdrawal, as was long time user of opioids.

## 2021-07-27 LAB — CBC WITH DIFFERENTIAL/PLATELET
Basophils Absolute: 0.1 10*3/uL (ref 0.0–0.2)
Basos: 1 %
EOS (ABSOLUTE): 0 10*3/uL (ref 0.0–0.4)
Eos: 0 %
Hematocrit: 41.1 % (ref 37.5–51.0)
Hemoglobin: 14.2 g/dL (ref 13.0–17.7)
Immature Grans (Abs): 0 10*3/uL (ref 0.0–0.1)
Immature Granulocytes: 0 %
Lymphocytes Absolute: 2.3 10*3/uL (ref 0.7–3.1)
Lymphs: 29 %
MCH: 30.7 pg (ref 26.6–33.0)
MCHC: 34.5 g/dL (ref 31.5–35.7)
MCV: 89 fL (ref 79–97)
Monocytes Absolute: 0.7 10*3/uL (ref 0.1–0.9)
Monocytes: 9 %
Neutrophils Absolute: 4.9 10*3/uL (ref 1.4–7.0)
Neutrophils: 61 %
Platelets: 363 10*3/uL (ref 150–450)
RBC: 4.62 x10E6/uL (ref 4.14–5.80)
RDW: 13 % (ref 11.6–15.4)
WBC: 8 10*3/uL (ref 3.4–10.8)

## 2021-07-27 LAB — COMPREHENSIVE METABOLIC PANEL
ALT: 23 IU/L (ref 0–44)
AST: 28 IU/L (ref 0–40)
Albumin/Globulin Ratio: 1.6 (ref 1.2–2.2)
Albumin: 4.4 g/dL (ref 3.8–4.9)
Alkaline Phosphatase: 84 IU/L (ref 44–121)
BUN/Creatinine Ratio: 14 (ref 9–20)
BUN: 13 mg/dL (ref 6–24)
Bilirubin Total: 0.6 mg/dL (ref 0.0–1.2)
CO2: 24 mmol/L (ref 20–29)
Calcium: 9 mg/dL (ref 8.7–10.2)
Chloride: 96 mmol/L (ref 96–106)
Creatinine, Ser: 0.9 mg/dL (ref 0.76–1.27)
Globulin, Total: 2.7 g/dL (ref 1.5–4.5)
Glucose: 88 mg/dL (ref 65–99)
Potassium: 4.4 mmol/L (ref 3.5–5.2)
Sodium: 137 mmol/L (ref 134–144)
Total Protein: 7.1 g/dL (ref 6.0–8.5)
eGFR: 99 mL/min/{1.73_m2} (ref >59)

## 2021-07-27 LAB — GAMMA GT: GGT: 88 IU/L — ABNORMAL HIGH (ref 0–65)

## 2021-07-27 NOTE — Progress Notes (Signed)
Contacted via [place] morning Nam, your labs have returned and are remaining stable.  GGT is now trending down, which I suspect is related to you cutting back on alcohol use, continue to cut back.  CBC is improved from previous visit.  I asked the referral coordinator and she spoke to GI, they did contact you at one point and you stated you will call them back -- I highly recommend you call them back to schedule.   [phone]  Any questions? Keep being stellar!!  Thank you for allowing me to participate in your care.  I appreciate you. Kindest regards, Adel Burch

## 2021-08-02 ENCOUNTER — Telehealth: Payer: Self-pay | Admitting: Nurse Practitioner

## 2021-08-02 NOTE — Telephone Encounter (Signed)
Pt called to find out status of PA for Rimegepant Sulfate (NURTEC) 75 MG TBDP / pt stated he advised provider of PA needed during his appt  last week/ please call pt and update

## 2021-08-06 NOTE — Telephone Encounter (Signed)
Prior Authorization was initiated via CoverMyMeds for Nurtec 75 mg. Prior Authorization is awaiting determination as it was sent for review per [facility].  Reference # [id number]  Prior Authorization Reference # [id number].  Reference # [id number]

## 2021-10-02 ENCOUNTER — Telehealth: Payer: Self-pay | Admitting: Gastroenterology

## 2021-10-02 NOTE — Telephone Encounter (Signed)
Inbound call from pt requesting a call back asking if he needed contrast before his appt. Please advise. Thank you.

## 2021-10-03 ENCOUNTER — Telehealth: Payer: Self-pay | Admitting: Nurse Practitioner

## 2021-10-03 NOTE — Telephone Encounter (Signed)
Please see pt request below, [name] helped pt last year with this issue.  Pt may need referral, please review and follow up with pt.

## 2021-10-03 NOTE — Telephone Encounter (Signed)
Called patient back and he was rescheduled for 10/10/2021.

## 2021-10-03 NOTE — Telephone Encounter (Signed)
Copied from [place] [id number]. Topic: General - Other >> Oct 03, 2021 10:19 AM Valere Dross wrote: Reason for CRM: Pt called in stating he normally gets vouchers from the office about getting assistance with his [employer], and wanted to see about getting one, and if someone could give him a call, please advise.

## 2021-10-04 ENCOUNTER — Other Ambulatory Visit: Payer: Self-pay

## 2021-10-04 ENCOUNTER — Ambulatory Visit: Payer: Self-pay

## 2021-10-04 ENCOUNTER — Ambulatory Visit: Payer: Medicaid Other | Admitting: Gastroenterology

## 2021-10-04 ENCOUNTER — Encounter: Payer: Self-pay | Admitting: Nurse Practitioner

## 2021-10-04 ENCOUNTER — Ambulatory Visit (INDEPENDENT_AMBULATORY_CARE_PROVIDER_SITE_OTHER): Payer: Medicaid Other | Admitting: Nurse Practitioner

## 2021-10-04 VITALS — BP 114/77 | HR 86 | Temp 97.5°F | Wt 198.0 lb

## 2021-10-04 DIAGNOSIS — I6523 Occlusion and stenosis of bilateral carotid arteries: Secondary | ICD-10-CM

## 2021-10-04 DIAGNOSIS — F17219 Nicotine dependence, cigarettes, with unspecified nicotine-induced disorders: Secondary | ICD-10-CM

## 2021-10-04 DIAGNOSIS — K219 Gastro-esophageal reflux disease without esophagitis: Secondary | ICD-10-CM

## 2021-10-04 DIAGNOSIS — J438 Other emphysema: Secondary | ICD-10-CM

## 2021-10-04 DIAGNOSIS — I700 Atherosclerosis of aorta: Secondary | ICD-10-CM | POA: Diagnosis not present

## 2021-10-04 DIAGNOSIS — Z8673 Personal history of transient ischemic attack (TIA), and cerebral infarction without residual deficits: Secondary | ICD-10-CM

## 2021-10-04 DIAGNOSIS — R748 Abnormal levels of other serum enzymes: Secondary | ICD-10-CM

## 2021-10-04 DIAGNOSIS — R197 Diarrhea, unspecified: Secondary | ICD-10-CM

## 2021-10-04 DIAGNOSIS — G894 Chronic pain syndrome: Secondary | ICD-10-CM

## 2021-10-04 DIAGNOSIS — Z596 Low income: Secondary | ICD-10-CM

## 2021-10-04 DIAGNOSIS — F10288 Alcohol dependence with other alcohol-induced disorder: Secondary | ICD-10-CM

## 2021-10-04 DIAGNOSIS — E782 Mixed hyperlipidemia: Secondary | ICD-10-CM

## 2021-10-04 DIAGNOSIS — Z23 Encounter for immunization: Secondary | ICD-10-CM | POA: Diagnosis not present

## 2021-10-04 DIAGNOSIS — I10 Essential (primary) hypertension: Secondary | ICD-10-CM

## 2021-10-04 NOTE — Assessment & Plan Note (Signed)
Refer to GERD plan of care, repeat GGT due to ongoing trend up recent labs, although recent imaging reassuring.  Scheduled to see GI, appreciate their input.

## 2021-10-04 NOTE — Patient Instructions (Signed)
Visit Information  PATIENT GOALS/PLAN OF CARE:  Care Plan : RNCM: General Plan of Care (Adult) for Chronic Disease Management and Care Coordination Needs  Updates made by Vanita Ingles, RN since 10/04/2021 12:00 AM     Problem: RNCM: Development of Plan of Care for Chronic Disease Managment (HTN, HLD, Emphysema, Smoker, ETOH, Chronic pain)   Priority: High     Long-Range Goal: RNCM: Development of Plan of Care for Chronic Disease Managment (HTN, HLD, Emphysema, Smoker, ETOH, Chronic pain)   Start Date: 10/04/2021  Expected End Date: 10/04/2022  Priority: High  Note:   Current Barriers:  Knowledge Deficits related to plan of care for management of HTN, HLD, COPD, Smoker, ETOH use, and chronic pain   Care Coordination needs related to Financial constraints related to inability to afford propane gas for heat source during the winter  and Substance abuse issues -  smoker and ETOH use  Chronic Disease Management support and education needs related to HTN, HLD, COPD, and Smoker, Alcohol use, and chronic pain  Lacks caregiver support.        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   RNCM Clinical Goal(s):  Patient will verbalize basic understanding of HTN, HLD, COPD, Tobacco Use, and Alcohol use, and chronic pain  disease process and self health management plan as evidenced by working with the CCM team to optimize health and well being and get needed resources to help with financial burden take all medications exactly as prescribed and will call provider for medication related questions as evidenced by taking medications as prescribed and calling for refills before running out of medications     continue to work with Consulting civil engineer and/or Social Worker to address care management and care coordination needs related to HTN, HLD,  COPD, Tobacco Use, and smoker, alcohol use and chronic pain  as evidenced by adherence to CM Team Scheduled appointments     demonstrate a decrease in alcohol abuse  exacerbations  as evidenced by cutting back on alcohol, reaching out to the pcp for assistance with rehab options, and working with the CCM team to effectively manage health and well being demonstrate ongoing self health care management ability for effective management of Chronic Conditions  as evidenced by working with the CCM team through collaboration with Consulting civil engineer, provider, and care team.   Interventions: 1:1 collaboration with primary care provider regarding development and update of comprehensive plan of care as evidenced by provider attestation and co-signature Inter-disciplinary care team collaboration (see longitudinal plan of care) Evaluation of current treatment plan related to  self management and patient's adherence to plan as established by provider   COPD: (Status: New goal. Goal on Track (progressing): YES.) Long Term Goal  Reviewed medications with patient, including use of prescribed maintenance and rescue inhalers, and provided instruction on medication management and the importance of adherence Provided patient with basic written and verbal COPD education on self care/management/and exacerbation prevention Advised patient to track and manage COPD triggers Provided written and verbal instructions on pursed lip breathing and utilized returned demonstration as teach back Provided instruction about proper use of medications used for management of COPD including inhalers Advised patient to self assesses COPD action plan zone and make appointment with provider if in the yellow zone for 48 hours without improvement Advised patient to engage in  light exercise as tolerated 3-5 days a week to aid in the the management of COPD Provided education about and advised patient to utilize infection prevention strategies to  reduce risk of respiratory infection Discussed the importance of adequate rest and management of fatigue with COPD Screening for signs and symptoms of depression related to chronic disease state  Assessed social determinant of health barriers  Alcohol use   (Status: New goal. Goal on Track (progressing): YES.) Long Term Goal  Evaluation of current treatment plan related to  Alcohol use , Substance abuse issues -  alcohol and smoking  self-management and patient's adherence to plan as established by provider. Discussed plans with patient for ongoing care management follow up and provided patient with direct contact information for care management team Advised patient to call the office for changes in condition, wanting to seek help with cessation of alcohol use; Provided education to patient re: benefits of cessation of alcohol; Discussed plans with patient for ongoing care management follow up and provided patient with direct contact information for care management team; Advised patient to discuss treatment options with provider;  Hyperlipidemia:  (Status: New goal. Goal on Track (progressing): YES.) Long Term Goal  Lab Results  Component Value Date   CHOL 176 03/27/2021   HDL 57 03/27/2021   LDLCALC 104 (H) 03/27/2021   TRIG 78 03/27/2021     Medication review performed; medication list updated in electronic medical record.  Provider established cholesterol goals reviewed; Counseled on importance of regular laboratory monitoring as prescribed; Provided HLD educational materials; Reviewed role and benefits of statin for ASCVD risk reduction; Discussed strategies to manage statin-induced myalgias; Reviewed importance of limiting foods high in cholesterol;  Hypertension: (Status: New goal. Goal on Track (progressing): YES.) Last practice recorded BP readings:  BP Readings from Last 3 Encounters:  10/04/21 114/77  07/26/21 (!) 144/87  06/28/21 (!) 140/93  Most recent eGFR/CrCl:  Lab  Results  Component Value Date   EGFR 99 07/26/2021    No components found for: CRCL  Evaluation of current treatment plan related to hypertension self management and patient's adherence to plan as established by provider;   Provided education to patient re: stroke prevention, s/s of heart attack and stroke; Reviewed prescribed diet heart healthy Reviewed medications with patient and discussed importance of compliance;  Counseled on adverse effects of illicit drug and excessive alcohol use in patients with high blood pressure;  Discussed plans with patient for ongoing care management follow up and provided patient with direct contact information for care management team; Advised patient, providing education and rationale, to monitor blood pressure daily and record, calling PCP for findings outside established parameters;  Advised patient to discuss blood pressure trends  with provider; Provided education on prescribed diet heart healthy;  Discussed complications of poorly controlled blood pressure such as heart disease, stroke, circulatory complications, vision complications, kidney impairment, sexual dysfunction;   Pain:  (Status: New goal. Goal on Track (progressing): YES.) Long Term Goal  Pain assessment performed Medications reviewed Reviewed provider established plan for pain management; Discussed importance of adherence to all scheduled medical appointments; Counseled on the importance of reporting any/all new or changed pain symptoms or management strategies to pain management provider; Advised patient to report to care team affect of pain on daily activities; Discussed use of relaxation techniques and/or diversional activities to assist with pain reduction (distraction, imagery, relaxation, massage, acupressure, TENS, heat, and cold application; Reviewed with patient prescribed pharmacological and nonpharmacological pain relief strategies; Advised patient  to discuss uncontrolled pain,  changes in level or intensity of pain  with provider;   Smoking Cessation: (Status: New goal.) Long Term Goal  Reviewed smoking history:  tobacco abuse of 43 years; currently smoking 1 ppd Previous quit attempts, unsuccessful unknown successful using unknown  Reports smoking within 30 minutes of waking up Reports triggers to smoke include: habit, stress reflief Reports motivation to quit smoking includes: knows would benefit health and well being  On a scale of 1-10, reports MOTIVATION to quit is 5 On a scale of 1-10, reports CONFIDENCE in quitting is 5  Evaluation of current treatment plan reviewed; Advised patient to discuss smoking cessation options with provider; Provided contact information for  Quit Line (1-800-QUIT-NOW); Discussed plans with patient for ongoing care management follow up and provided patient with direct contact information for care management team; Advised patient to discuss smoking cessation options  with provider;  Patient Goals/Self-Care Activities: Patient will self administer medications as prescribed as evidenced by self report/primary caregiver report  Patient will attend all scheduled provider appointments as evidenced by clinician review of documented attendance to scheduled appointments and patient/caregiver report Patient will call pharmacy for medication refills as evidenced by patient report and review of pharmacy fill history as appropriate Patient will attend church or other social activities as evidenced by patient report Patient will continue to perform ADL's independently as evidenced by patient/caregiver report Patient will continue to perform IADL's independently as evidenced by patient/caregiver report Patient will call provider office for new concerns or questions as evidenced by review of documented incoming telephone call notes and patient report Patient will work with BSW to address care coordination needs and will continue to work with the  clinical team to address health care and disease management related needs as evidenced by documented adherence to scheduled care management/care coordination appointments - eliminate smoking in my home - identify and remove indoor air pollutants - limit outdoor activity during cold weather - listen for public air quality announcements every day - do breathing exercises every day - develop a rescue plan - eliminate symptom triggers at home - follow rescue plan if symptoms flare-up - use an extra pillow to sleep - develop a new routine to improve sleep - don't eat or exercise right before bedtime - eat healthy/prescribed diet: Heart healthy/ADA diet  - get at least 7 to 8 hours of sleep at night - use devices that will help like a cane, sock-puller or reacher - practice relaxation or meditation daily - do exercises in a comfortable position that makes breathing as easy as possible - check blood pressure daily - choose a place to take my blood pressure (home, clinic or office, retail store) - learn about high blood pressure - keep a blood pressure log - take blood pressure log to all doctor appointments - call doctor for signs and symptoms of high blood pressure - develop an action plan for high blood pressure - keep all doctor appointments - take medications for blood pressure exactly as prescribed - report new symptoms to your doctor - eat more whole grains, fruits and vegetables, lean meats and healthy fats - call for medicine refill 2 or 3 days before it runs out - take all medications exactly as prescribed - call doctor with any symptoms you believe are related to your medicine - call doctor when you experience any new symptoms - go to all doctor appointments as scheduled - adhere to prescribed diet: heart healthy diet  Mr. Kamara was given information about Care Management services by the embedded care coordination team including:  Care Management services include  personalized support from designated clinical staff supervised by his physician, including individualized plan of care and coordination with other care providers 24/7 contact phone numbers for assistance for urgent and routine care needs. The patient may stop CCM services at any time (effective at the end of the month) by phone call to the office staff.  Patient agreed to services and verbal consent obtained.   Patient verbalizes understanding of instructions provided today and agrees to view in [place].   Telephone follow up appointment with care management team member scheduled for: 12-12-2021 at 0900 am  Noreene Larsson RN, MSN, [facility] Mobile: [phone]

## 2021-10-04 NOTE — Assessment & Plan Note (Addendum)
Chronic, ongoing.  Continue Omeprazole daily and adjust as needed.  Continues to have ongoing abdominal pain with tenderness to RUQ, scheduled to see GI upcoming which will be beneficial, appreciate their input.  Repeat CMP, GGT, CBC today. Recent Hep labs negative. ?related to alcohol intake.

## 2021-10-04 NOTE — Telephone Encounter (Signed)
Routing to provider and CCM team.

## 2021-10-04 NOTE — Chronic Care Management (AMB) (Signed)
Care Management    RN Visit Note  10/04/2021 Name: Luke Briggs. MRN: [id number] DOB: 02-Jul-1961  Subjective: Luke Briggs. is a 60 y.o. year old male who is a primary care patient of [name], Barbaraann Faster, NP. The care management team was consulted for assistance with disease management and care coordination needs.    Engaged with patient by telephone for initial visit in response to provider referral for case management and/or care coordination services.   Consent to Services:   Mr. Simonis was given information about Care Management services today including:  Care Management services includes personalized support from designated clinical staff supervised by his physician, including individualized plan of care and coordination with other care providers 24/7 contact phone numbers for assistance for urgent and routine care needs. The patient may stop case management services at any time by phone call to the office staff.  Patient agreed to services and consent obtained.   Assessment: Review of patient past medical history, allergies, medications, health status, including review of consultants reports, laboratory and other test data, was performed as part of comprehensive evaluation and provision of chronic care management services.   SDOH (Social Determinants of Health) assessments and interventions performed:  SDOH Interventions    Flowsheet Row Most Recent Value  SDOH Interventions   Food Insecurity Interventions Intervention Not Indicated  Financial Strain Interventions Other (Comment)  [care guide referral pending, LCSW and RNCM also working with the patient]  Housing Interventions Intervention Not Indicated  Intimate Partner Violence Interventions Intervention Not Indicated  Physical Activity Interventions Other (Comments)  [no structured activity]  Stress Interventions Other (Comment)  [the patient is concerned about propane to heat home, care guide referral pending,  LCSW and RNCM working with the patient]  Social Connections Interventions Other (Comment)  [good support system]  Transportation Interventions Intervention Not Indicated        Care Plan  Allergies  Allergen Reactions   Lisinopril Swelling    Reaction: angioedema   Amlodipine     Dizziness and edema with this   Penicillins Rash    Reaction: Feels like skin is on fire Did it involve swelling of the face/tongue/throat, SOB, or low BP? No Did it involve sudden or severe rash/hives, skin peeling, or any reaction on the inside of your mouth or nose? No Did you need to seek medical attention at a hospital or doctor's office? Yes When did it last happen? 30 years ago If all above answers are "NO", may proceed with cephalosporin use.     Outpatient Encounter Medications as of 10/04/2021  Medication Sig   albuterol (VENTOLIN HFA) 108 (90 Base) MCG/ACT inhaler Inhale 2 puffs into the lungs every 6 (six) hours as needed for wheezing orshortness of breath.   atorvastatin (LIPITOR) 10 MG tablet Take 1 tablet (10 mg total) by mouth daily.   EPINEPHrine 0.3 mg/0.3 mL IJ SOAJ injection Inject 0.3 mLs (0.3 mg total) into the muscle as needed for anaphylaxis.   finasteride (PROSCAR) 5 MG tablet Take 1 tablet (5 mg total) by mouth daily.   hydrocortisone cream 0.5 % Apply 1 application topically 2 (two) times daily.   Ipratropium-Albuterol (COMBIVENT) 20-100 MCG/ACT AERS respimat Inhale 1 puff into the lungs every 6 (six) hours.   lubiprostone (AMITIZA) 24 MCG capsule Take 1 capsule (24 mcg total) by mouth 2 (two) times daily with a meal.   montelukast (SINGULAIR) 10 MG tablet Take 1 tablet (10 mg total) by mouth at bedtime.  olmesartan-hydrochlorothiazide (BENICAR HCT) 20-12.5 MG tablet Take 1 tablet by mouth daily.   omeprazole (PRILOSEC) 40 MG capsule Take 1 capsule (40 mg total) by mouth daily.   Rimegepant Sulfate (NURTEC) 75 MG TBDP Take 75 mg by mouth daily as needed (for migraine as needed  only).   tadalafil (CIALIS) 5 MG tablet Take 1 tablet (5 mg total) by mouth daily as needed for erectile dysfunction.   tamsulosin (FLOMAX) 0.4 MG CAPS capsule Take 1 capsule (0.4 mg total) by mouth daily.   Tiotropium Bromide-Olodaterol (STIOLTO RESPIMAT) 2.5-2.5 MCG/ACT AERS Inhale 2 puffs into the lungs daily.   tiZANidine (ZANAFLEX) 4 MG tablet Take 1 tablet (4 mg total) by mouth every 6 (six) hours as needed for muscle spasms.   topiramate (TOPAMAX) 25 MG tablet Start out at 25 MG (one tablet) by mouth daily for one week and then increase to 50 MG (two tablets) by mouth daily.   triamcinolone cream (KENALOG) 0.1 % Apply 1 application topically 2 (two) times daily.   [DISCONTINUED] carvedilol (COREG) 3.125 MG tablet Take 1 tablet (3.125 mg total) by mouth 2 (two) times daily.   No facility-administered encounter medications on file as of 10/04/2021.    Patient Active Problem List   Diagnosis Date Noted   Elevated serum GGT level 07/26/2021   Diarrhea 07/26/2021   Mixed hyperlipidemia 03/27/2021   History of lacunar cerebrovascular accident 02/13/2021   Tinnitus of both ears 02/13/2021   Migraines 02/13/2021   Opioid withdrawal ([place]) 01/10/2021   Carotid stenosis 12/31/2020   Erectile dysfunction 03/22/2020   Aortic atherosclerosis ([place]) 09/29/2019   Bilateral primary osteoarthritis of knee 07/01/2019   Paraseptal emphysema ([place]) 06/17/2019   Controlled substance agreement signed 05/05/2019   Chronic pain syndrome 04/28/2019   BPH (benign prostatic hyperplasia) 04/28/2019   Eczema 04/28/2019   Essential hypertension 04/28/2019   GERD (gastroesophageal reflux disease) 04/28/2019   Schizophrenia ([place]) 04/28/2019   Chronic post-traumatic stress disorder (PTSD) 04/28/2019   Hemorrhoids 04/28/2019   Nicotine dependence, cigarettes, w unsp disorders 04/28/2019   Alcohol dependence ([place]) 04/28/2019    Conditions to be addressed/monitored: HTN, HLD, COPD, Tobacco Use, and Alcohol  use, and Chronic Pain   Care Plan : RNCM: General Plan of Care (Adult) for Chronic Disease Management and Care Coordination Needs  Updates made by Vanita Ingles, RN since 10/04/2021 12:00 AM     Problem: RNCM: Development of Plan of Care for Chronic Disease Managment (HTN, HLD, Emphysema, Smoker, ETOH, Chronic pain)   Priority: High     Long-Range Goal: RNCM: Development of Plan of Care for Chronic Disease Managment (HTN, HLD, Emphysema, Smoker, ETOH, Chronic pain)   Start Date: 10/04/2021  Expected End Date: 10/04/2022  Priority: High  Note:   Current Barriers:  Knowledge Deficits related to plan of care for management of HTN, HLD, COPD, Smoker, ETOH use, and chronic pain   Care Coordination needs related to Financial constraints related to inability to afford propane gas for heat source during the winter  and Substance abuse issues -  smoker and ETOH use  Chronic Disease Management support and education needs related to HTN, HLD, COPD, and Smoker, Alcohol use, and chronic pain  Lacks caregiver support.        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   RNCM Clinical Goal(s):  Patient will verbalize  basic understanding of HTN, HLD, COPD, Tobacco Use, and Alcohol use, and chronic pain  disease process and self health management plan as evidenced by working with the CCM team to optimize health and well being and get needed resources to help with financial burden take all medications exactly as prescribed and will call provider for medication related questions as evidenced by taking medications as prescribed and calling for refills before running out of medications     continue to work with Consulting civil engineer and/or Social Worker to address care management and care coordination needs related to HTN, HLD, COPD, Tobacco Use, and  smoker, alcohol use and chronic pain  as evidenced by adherence to CM Team Scheduled appointments     demonstrate a decrease in alcohol abuse  exacerbations  as evidenced by cutting back on alcohol, reaching out to the pcp for assistance with rehab options, and working with the CCM team to effectively manage health and well being demonstrate ongoing self health care management ability for effective management of Chronic Conditions  as evidenced by working with the CCM team through collaboration with Consulting civil engineer, provider, and care team.   Interventions: 1:1 collaboration with primary care provider regarding development and update of comprehensive plan of care as evidenced by provider attestation and co-signature Inter-disciplinary care team collaboration (see longitudinal plan of care) Evaluation of current treatment plan related to  self management and patient's adherence to plan as established by provider   COPD: (Status: New goal. Goal on Track (progressing): YES.) Long Term Goal  Reviewed medications with patient, including use of prescribed maintenance and rescue inhalers, and provided instruction on medication management and the importance of adherence Provided patient with basic written and verbal COPD education on self care/management/and exacerbation prevention Advised patient to track and manage COPD triggers Provided written and verbal instructions on pursed lip breathing and utilized returned demonstration as teach back Provided instruction about proper use of medications used for management of COPD including inhalers Advised patient to self assesses COPD action plan zone and make appointment with provider if in the yellow zone for 48 hours without improvement Advised patient to engage in light exercise as tolerated 3-5 days a week to aid in the the management of COPD Provided education about and advised patient to utilize infection prevention strategies to reduce risk of  respiratory infection Discussed the importance of adequate rest and management of fatigue with COPD Screening for signs and symptoms of depression related to chronic disease state  Assessed social determinant of health barriers  Alcohol use   (Status: New goal. Goal on Track (progressing): YES.) Long Term Goal  Evaluation of current treatment plan related to  Alcohol use , Substance abuse issues -  alcohol and smoking  self-management and patient's adherence to plan as established by provider. Discussed plans with patient for ongoing care management follow up and provided patient with direct contact information for care management team Advised patient to call the office for changes in condition, wanting to seek help with cessation of alcohol use; Provided education to patient re: benefits of cessation of alcohol; Discussed plans with patient for ongoing care management follow up and provided patient with direct contact information for care management team; Advised patient to discuss treatment options with provider;  Hyperlipidemia:  (Status: New goal. Goal on Track (progressing): YES.) Long Term Goal  Lab Results  Component Value Date   CHOL 176 03/27/2021   HDL 57 03/27/2021   LDLCALC 104 (H) 03/27/2021   TRIG  78 03/27/2021     Medication review performed; medication list updated in electronic medical record.  Provider established cholesterol goals reviewed; Counseled on importance of regular laboratory monitoring as prescribed; Provided HLD educational materials; Reviewed role and benefits of statin for ASCVD risk reduction; Discussed strategies to manage statin-induced myalgias; Reviewed importance of limiting foods high in cholesterol;  Hypertension: (Status: New goal. Goal on Track (progressing): YES.) Last practice recorded BP readings:  BP Readings from Last 3 Encounters:  10/04/21 114/77  07/26/21 (!) 144/87  06/28/21 (!) 140/93  Most recent eGFR/CrCl:  Lab Results   Component Value Date   EGFR 99 07/26/2021    No components found for: CRCL  Evaluation of current treatment plan related to hypertension self management and patient's adherence to plan as established by provider;   Provided education to patient re: stroke prevention, s/s of heart attack and stroke; Reviewed prescribed diet heart healthy Reviewed medications with patient and discussed importance of compliance;  Counseled on adverse effects of illicit drug and excessive alcohol use in patients with high blood pressure;  Discussed plans with patient for ongoing care management follow up and provided patient with direct contact information for care management team; Advised patient, providing education and rationale, to monitor blood pressure daily and record, calling PCP for findings outside established parameters;  Advised patient to discuss blood pressure trends  with provider; Provided education on prescribed diet heart healthy;  Discussed complications of poorly controlled blood pressure such as heart disease, stroke, circulatory complications, vision complications, kidney impairment, sexual dysfunction;   Pain:  (Status: New goal. Goal on Track (progressing): YES.) Long Term Goal  Pain assessment performed Medications reviewed Reviewed provider established plan for pain management; Discussed importance of adherence to all scheduled medical appointments; Counseled on the importance of reporting any/all new or changed pain symptoms or management strategies to pain management provider; Advised patient to report to care team affect of pain on daily activities; Discussed use of relaxation techniques and/or diversional activities to assist with pain reduction (distraction, imagery, relaxation, massage, acupressure, TENS, heat, and cold application; Reviewed with patient prescribed pharmacological and nonpharmacological pain relief strategies; Advised patient to discuss uncontrolled pain, changes  in level or intensity of pain  with provider;   Smoking Cessation: (Status: New goal.) Long Term Goal  Reviewed smoking history:  tobacco abuse of 43 years; currently smoking 1 ppd Previous quit attempts, unsuccessful unknown successful using unknown  Reports smoking within 30 minutes of waking up Reports triggers to smoke include: habit, stress reflief Reports motivation to quit smoking includes: knows would benefit health and well being  On a scale of 1-10, reports MOTIVATION to quit is 5 On a scale of 1-10, reports CONFIDENCE in quitting is 5  Evaluation of current treatment plan reviewed; Advised patient to discuss smoking cessation options with provider; Provided contact information for [place] Quit Line (1-800-QUIT-NOW); Discussed plans with patient for ongoing care management follow up and provided patient with direct contact information for care management team; Advised patient to discuss smoking cessation options  with provider;  Patient Goals/Self-Care Activities: Patient will self administer medications as prescribed as evidenced by self report/primary caregiver report  Patient will attend all scheduled provider appointments as evidenced by clinician review of documented attendance to scheduled appointments and patient/caregiver report Patient will call pharmacy for medication refills as evidenced by patient report and review of pharmacy fill history as appropriate Patient will attend church or other social activities as evidenced by patient report Patient will continue  to perform ADL's independently as evidenced by patient/caregiver report Patient will continue to perform IADL's independently as evidenced by patient/caregiver report Patient will call provider office for new concerns or questions as evidenced by review of documented incoming telephone call notes and patient report Patient will work with BSW to address care coordination needs and will continue to work with the  clinical team to address health care and disease management related needs as evidenced by documented adherence to scheduled care management/care coordination appointments - eliminate smoking in my home - identify and remove indoor air pollutants - limit outdoor activity during cold weather - listen for public air quality announcements every day - do breathing exercises every day - develop a rescue plan - eliminate symptom triggers at home - follow rescue plan if symptoms flare-up - use an extra pillow to sleep - develop a new routine to improve sleep - don't eat or exercise right before bedtime - eat healthy/prescribed diet: Heart healthy/ADA diet  - get at least 7 to 8 hours of sleep at night - use devices that will help like a cane, sock-puller or reacher - practice relaxation or meditation daily - do exercises in a comfortable position that makes breathing as easy as possible - check blood pressure daily - choose a place to take my blood pressure (home, clinic or office, retail store) - learn about high blood pressure - keep a blood pressure log - take blood pressure log to all doctor appointments - call doctor for signs and symptoms of high blood pressure - develop an action plan for high blood pressure - keep all doctor appointments - take medications for blood pressure exactly as prescribed - report new symptoms to your doctor - eat more whole grains, fruits and vegetables, lean meats and healthy fats - call for medicine refill 2 or 3 days before it runs out - take all medications exactly as prescribed - call doctor with any symptoms you believe are related to your medicine - call doctor when you experience any new symptoms - go to all doctor appointments as scheduled - adhere to prescribed diet: heart healthy diet       Plan: Telephone follow up appointment with care management team member scheduled for:  12-12-2021 at 0900 am  Noreene Larsson RN, MSN, [facility] Mobile: [phone]

## 2021-10-04 NOTE — Assessment & Plan Note (Signed)
History of infarct, continue collaboration with neuro and ENT for ongoing tinnitus, headaches, and dizziness.

## 2021-10-04 NOTE — Patient Instructions (Signed)
Managing the Challenge of Quitting Smoking ?Quitting smoking is a physical and mental challenge. You will face cravings, withdrawal symptoms, and temptation. Before quitting, work with your health care provider to make a plan that can help you manage quitting. Preparation can help you quit and keep you from giving in. ?How to manage lifestyle changes ?Managing stress ?Stress can make you want to smoke, and wanting to smoke may cause stress. It is important to find ways to manage your stress. You might try some of the following: ?Practice relaxation techniques. ?Breathe slowly and deeply, in through your nose and out through your mouth. ?Listen to music. ?Soak in a bath or take a shower. ?Imagine a peaceful place or vacation. ?Get some support. ?Talk with family or friends about your stress. ?Join a support group. ?Talk with a counselor or therapist. ?Get some physical activity. ?Go for a walk, run, or bike ride. ?Play a favorite sport. ?Practice yoga. ? ?Medicines ?Talk with your health care provider about medicines that might help you deal with cravings and make quitting easier for you. ?Relationships ?Social situations can be difficult when you are quitting smoking. To manage this, you can: ?Avoid parties and other social situations where people might be smoking. ?Avoid alcohol. ?Leave right away if you have the urge to smoke. ?Explain to your family and friends that you are quitting smoking. Ask for support and let them know you might be a bit grumpy. ?Plan activities where smoking is not an option. ?General instructions ?Be aware that many people gain weight after they quit smoking. However, not everyone does. To keep from gaining weight, have a plan in place before you quit and stick to the plan after you quit. Your plan should include: ?Having healthy snacks. When you have a craving, it may help to: ?Eat popcorn, carrots, celery, or other cut vegetables. ?Chew sugar-free gum. ?Changing how you eat. ?Eat small  portion sizes at meals. ?Eat 4-6 small meals throughout the day instead of 1-2 large meals a day. ?Be mindful when you eat. Do not watch television or do other things that might distract you as you eat. ?Exercising regularly. ?Make time to exercise each day. If you do not have time for a long workout, do short bouts of exercise for 5-10 minutes several times a day. ?Do some form of strengthening exercise, such as weight lifting. ?Do some exercise that gets your heart beating and causes you to breathe deeply, such as walking fast, running, swimming, or biking. This is very important. ?Drinking plenty of water or other low-calorie or no-calorie drinks. Drink 6-8 glasses of water daily. ? ?How to recognize withdrawal symptoms ?Your body and mind may experience discomfort as you try to get used to not having nicotine in your system. These effects are called withdrawal symptoms. They may include: ?Feeling hungrier than normal. ?Having trouble concentrating. ?Feeling irritable or restless. ?Having trouble sleeping. ?Feeling depressed. ?Craving a cigarette. ?To manage withdrawal symptoms: ?Avoid places, people, and activities that trigger your cravings. ?Remember why you want to quit. ?Get plenty of sleep. ?Avoid coffee and other caffeinated drinks. These may worsen some of your symptoms. ?These symptoms may surprise you. But be assured that they are normal to have when quitting smoking. ?How to manage cravings ?Come up with a plan for how to deal with your cravings. The plan should include the following: ?A definition of the specific situation you want to deal with. ?An alternative action you will take. ?A clear idea for how this action   will help. ?The name of someone who might help you with this. ?Cravings usually last for 5-10 minutes. Consider taking the following actions to help you with your plan to deal with cravings: ?Keep your mouth busy. ?Chew sugar-free gum. ?Suck on hard candies or a straw. ?Brush your  teeth. ?Keep your hands and body busy. ?Change to a different activity right away. ?Squeeze or play with a ball. ?Do an activity or a hobby, such as making bead jewelry, practicing needlepoint, or working with wood. ?Mix up your normal routine. ?Take a short exercise break. Go for a quick walk or run up and down stairs. ?Focus on doing something kind or helpful for someone else. ?Call a friend or family member to talk during a craving. ?Join a support group. ?Contact a quitline. ?Where to find support ?To get help or find a support group: ?Call the National Cancer Institute's Smoking Quitline: 1-800-QUIT NOW (784-8669) ?Visit the website of the Substance Abuse and Mental Health Services Administration: www.samhsa.gov ?Text QUIT to SmokefreeTXT: 478848 ?Where to find more information ?Visit these websites to find more information on quitting smoking: ?National Cancer Institute: www.smokefree.gov ?American Lung Association: www.lung.org ?American Cancer Society: www.cancer.org ?Centers for Disease Control and Prevention: www.cdc.gov ?American Heart Association: www.heart.org ?Contact a health care provider if: ?You want to change your plan for quitting. ?The medicines you are taking are not helping. ?Your eating feels out of control or you cannot sleep. ?Get help right away if: ?You feel depressed or become very anxious. ?Summary ?Quitting smoking is a physical and mental challenge. You will face cravings, withdrawal symptoms, and temptation to smoke again. Preparation can help you as you go through these challenges. ?Try different techniques to manage stress, handle social situations, and prevent weight gain. ?You can deal with cravings by keeping your mouth busy (such as by chewing gum), keeping your hands and body busy, calling family or friends, or contacting a quitline for people who want to quit smoking. ?You can deal with withdrawal symptoms by avoiding places where people smoke, getting plenty of rest, and  avoiding drinks with caffeine. ?This information is not intended to replace advice given to you by your health care provider. Make sure you discuss any questions you have with your health care provider. ?Document Revised: 07/13/2021 Document Reviewed: 08/24/2019 ?Elsevier Patient Education ? 2022 [employer] ? ?

## 2021-10-04 NOTE — Assessment & Plan Note (Signed)
Continue collaboration with vascular on yearly basis, to return 02/01/22.

## 2021-10-04 NOTE — Assessment & Plan Note (Signed)
I have recommended complete cessation of tobacco use. I have discussed various options available for assistance with tobacco cessation including over the counter methods (Nicotine gum, patch and lozenges). We also discussed prescription options (Chantix, Nicotine Inhaler / Nasal Spray). The patient is not interested in pursuing any prescription tobacco cessation options at this time.  

## 2021-10-04 NOTE — Progress Notes (Signed)
BP 114/77   Pulse 86   Temp (!) 97.5 F (36.4 C) (Oral)   Wt 198 lb (89.8 kg)   SpO2 97%   BMI 28.41 kg/m    Subjective:    Patient ID: Luke Pollen., male    DOB: February 07, 1961, 60 y.o.   MRN: [id number]  HPI: Luke Briggs. is a 60 y.o. male  Chief Complaint  Patient presents with   Immunizations    Patient is here for Flu vaccine.   Liver Check Up    Patient states he is here for a liver check up. Patient states he was scheduled for an ultrasound of the liver and states that he had to reschedule due to the provider being out sick.    COPD Uses Combivent PRN, Stiolto, and Albuterol as needed.  Had last lung screening on 10/10/20 -- noting emphysema and aortic atherosclerosis. Satisfied with current treatment?: yes Oxygen use: no Dyspnea frequency: minimal Cough frequency: none Rescue inhaler frequency:   Limitation of activity: no Productive cough: none Last Spirometry: unknown Pneumovax: Not up to Date Influenza: Up to Date  GERD Taking Omeprazole 40 MG daily and Lubiprostone for bowel regimen -- just takes this as needed for constipation.  Denies any recent blood in stool.  Reports ongoing fatigue and continues to have N&V almost every day.  Recent CT scan on 07/03/21 which showed no acute findings -- his recent labs were unremarkable with exception of GGT at 148.  Did have colonoscopy 12/14/20 with Dr. Vicente Males that showed polyps and he is to have repeat in 3 years. Scheduled for GI follow-up 10/10/21, originally 10/04/21, but had to reschedule.  Continues to drink  daily, has cut back to 6-10 Natural Light beer a week at this time, like on Sunday (football day). Not drinking every day. Reports drinking helps his tinnitus.  Continues to smoke 1 PPD, has been smoking for 45 years.  Not interested in quitting.   GERD control status: stable Satisfied with current treatment? yes Heartburn frequency:  Medication side effects: no  Medication compliance:  fluctuating Previous GERD medications: TUMS Dysphagia: no Odynophagia:  no Hematemesis: no Blood in stool: no EGD: yes    HYPERTENSION/HLD Saw cardiology last on 08/28/21 at [facility] (Dr. Smith Mince).  She had increased HCTZ on 08/06/21 -- current medications Olmesartan-HCTZ + Atorvastatin.  Cardiology performed myoview in past and this showed no ischemia on stress test.     Has chronic right basal ganglia lacunar infarct on imaging and continues to complain of ongoing tinnitus and intermittent dizziness -- sees ENT & neuro for this and last saw Dr. Melrose Nakayama 03/08/21.   Vascular performed duplex ultrasound which showed widely patent RICA and 03% LICA stenosis + chronic right basal ganglia lacunar infarct, to return in 12 months (02/01/22).     Last echo performed 11/18/19 -- EF 60-65% and no left ventricular hypertrophy.   Hypertension status: stable  Satisfied with current treatment? yes Duration of hypertension: chronic BP monitoring frequency:  daily BP range: 120-130/70 BP medication side effects:  no Medication compliance: good compliance Previous BP meds:has tried multiple medications and had ADR  Aspirin: no Recurrent headaches: yes -- take Tylenol and Goody Powder, Nurtec did not offer benefit Visual changes: no Palpitations: no Dyspnea: no Chest pain: no Lower extremity edema: no Dizzy/lightheaded: no  The 10-year ASCVD risk score (Arnett DK, et al., 2019) is: 9.4%   Values used to calculate the score:     Age: 44 years  Sex: Male     Is Non-Hispanic African American: No     Diabetic: No     Tobacco smoker: Yes     Systolic Blood Pressure: 591 mmHg     Is BP treated: Yes     HDL Cholesterol: 57 mg/dL     Total Cholesterol: 176 mg/dL  Relevant past medical, surgical, family and social history reviewed and updated as indicated. Interim medical history since our last visit reviewed. Allergies and medications reviewed and updated.  Review of Systems  Constitutional:   Positive for fatigue. Negative for activity change, chills, diaphoresis and fever.  Respiratory:  Negative for cough, chest tightness, shortness of breath and wheezing.   Cardiovascular:  Negative for chest pain, palpitations and leg swelling.  Gastrointestinal: Negative.   Endocrine: Negative for cold intolerance, heat intolerance, polydipsia, polyphagia and polyuria.  Neurological:  Negative for dizziness, syncope, weakness, light-headedness, numbness and headaches.  Psychiatric/Behavioral: Negative.     Per HPI unless specifically indicated above     Objective:    BP 114/77   Pulse 86   Temp (!) 97.5 F (36.4 C) (Oral)   Wt 198 lb (89.8 kg)   SpO2 97%   BMI 28.41 kg/m   Wt Readings from Last 3 Encounters:  10/04/21 198 lb (89.8 kg)  07/26/21 196 lb 9.6 oz (89.2 kg)  06/28/21 192 lb 6.4 oz (87.3 kg)    Physical Exam Vitals and nursing note reviewed.  Constitutional:      General: He is awake. He is not in acute distress.    Appearance: He is well-developed and well-groomed. He is not ill-appearing or toxic-appearing.  HENT:     Head: Normocephalic and atraumatic.     Right Ear: Hearing normal. No drainage.     Left Ear: Hearing normal. No drainage.  Eyes:     General: Lids are normal.        Right eye: No discharge.        Left eye: No discharge.     Conjunctiva/sclera: Conjunctivae normal.     Pupils: Pupils are equal, round, and reactive to light.  Neck:     Thyroid: No thyromegaly.     Vascular: No carotid bruit.  Cardiovascular:     Rate and Rhythm: Normal rate and regular rhythm.     Heart sounds: Normal heart sounds, S1 normal and S2 normal. No murmur heard.   No gallop.  Pulmonary:     Effort: Pulmonary effort is normal. No accessory muscle usage or respiratory distress.     Breath sounds: Normal breath sounds.  Abdominal:     General: Bowel sounds are normal. There is no distension.     Palpations: Abdomen is soft. There is no fluid wave, hepatomegaly or  mass.     Tenderness: There is abdominal tenderness in the right upper quadrant. There is no right CVA tenderness, left CVA tenderness, guarding or rebound. Negative signs include Murphy's sign and McBurney's sign.     Hernia: No hernia is present.  Musculoskeletal:        General: Normal range of motion.     Cervical back: Normal range of motion and neck supple.     Right lower leg: No edema.     Left lower leg: No edema.  Skin:    General: Skin is warm and dry.     Capillary Refill: Capillary refill takes less than 2 seconds.     Findings: No rash.  Neurological:     Mental  Status: He is alert and oriented to person, place, and time.     Motor: Motor function is intact.     Coordination: Coordination is intact.     Gait: Gait is intact.     Deep Tendon Reflexes: Reflexes are normal and symmetric.     Reflex Scores:      Brachioradialis reflexes are 2+ on the right side and 2+ on the left side.      Patellar reflexes are 2+ on the right side and 2+ on the left side. Psychiatric:        Attention and Perception: Attention normal.        Mood and Affect: Mood normal.        Speech: Speech normal.        Behavior: Behavior normal. Behavior is cooperative.        Thought Content: Thought content normal.   Results for orders placed or performed in visit on 07/26/21  Comprehensive metabolic panel  Result Value Ref Range   Glucose 88 65 - 99 mg/dL   BUN 13 6 - 24 mg/dL   Creatinine, Ser 0.90 0.76 - 1.27 mg/dL   eGFR 99 >59 mL/min/1.73   BUN/Creatinine Ratio 14 9 - 20   Sodium 137 134 - 144 mmol/L   Potassium 4.4 3.5 - 5.2 mmol/L   Chloride 96 96 - 106 mmol/L   CO2 24 20 - 29 mmol/L   Calcium 9.0 8.7 - 10.2 mg/dL   Total Protein 7.1 6.0 - 8.5 g/dL   Albumin 4.4 3.8 - 4.9 g/dL   Globulin, Total 2.7 1.5 - 4.5 g/dL   Albumin/Globulin Ratio 1.6 1.2 - 2.2   Bilirubin Total 0.6 0.0 - 1.2 mg/dL   Alkaline Phosphatase 84 44 - 121 IU/L   AST 28 0 - 40 IU/L   ALT 23 0 - 44 IU/L  Gamma  GT  Result Value Ref Range   GGT 88 (H) 0 - 65 IU/L  CBC with Differential/Platelet  Result Value Ref Range   WBC 8.0 3.4 - 10.8 x10E3/uL   RBC 4.62 4.14 - 5.80 x10E6/uL   Hemoglobin 14.2 13.0 - 17.7 g/dL   Hematocrit 41.1 37.5 - 51.0 %   MCV 89 79 - 97 fL   MCH 30.7 26.6 - 33.0 pg   MCHC 34.5 31.5 - 35.7 g/dL   RDW 13.0 11.6 - 15.4 %   Platelets 363 150 - 450 x10E3/uL   Neutrophils 61 Not Estab. %   Lymphs 29 Not Estab. %   Monocytes 9 Not Estab. %   Eos 0 Not Estab. %   Basos 1 Not Estab. %   Neutrophils Absolute 4.9 1.4 - 7.0 x10E3/uL   Lymphocytes Absolute 2.3 0.7 - 3.1 x10E3/uL   Monocytes Absolute 0.7 0.1 - 0.9 x10E3/uL   EOS (ABSOLUTE) 0.0 0.0 - 0.4 x10E3/uL   Basophils Absolute 0.1 0.0 - 0.2 x10E3/uL   Immature Granulocytes 0 Not Estab. %   Immature Grans (Abs) 0.0 0.0 - 0.1 x10E3/uL      Assessment & Plan:   Problem List Items Addressed This Visit       Cardiovascular and Mediastinum   Aortic atherosclerosis ([facility])    This was noticed on November 2020 lung CT.  Continue statin therapy + recommend complete cessation of smoking.  Lipid panel today.      Carotid stenosis    Continue collaboration with vascular on yearly basis, to return 02/01/22.      Essential hypertension  Chronic, ongoing with BP MUCH improved for him today - suspect main exacerbation is his alcohol intake. Continue current medication regimen as prescribed by Dr. Smith Mince at [facility] and continue this collaboration.  Continued alcohol use but is cutting back, continues to be a difficulty in maintaining and controlling BP level.  Recommend cutting back on alcohol and smoking.  Continue collaboration with cardiology. CMP today.  Return in 3 months.      Relevant Orders   Comprehensive metabolic panel   TSH     Respiratory   Paraseptal emphysema (HCC) - Primary    Chronic, ongoing.  Continue current inhaler regimen at this time and recommend complete cessation of smoking.  Continue yearly lung  screening -- will reach out to team to schedule this as is due.  Consider pulmonary referral in future.      Relevant Orders   CBC with Differential/Platelet     Digestive   GERD (gastroesophageal reflux disease)    Chronic, ongoing.  Continue Omeprazole daily and adjust as needed.  Continues to have ongoing abdominal pain with tenderness to RUQ, scheduled to see GI upcoming which will be beneficial, appreciate their input.  Repeat CMP, GGT, CBC today. Recent Hep labs negative. ?related to alcohol intake.      Relevant Orders   Magnesium     Nervous and Auditory   Nicotine dependence, cigarettes, w unsp disorders    I have recommended complete cessation of tobacco use. I have discussed various options available for assistance with tobacco cessation including over the counter methods (Nicotine gum, patch and lozenges). We also discussed prescription options (Chantix, Nicotine Inhaler / Nasal Spray). The patient is not interested in pursuing any prescription tobacco cessation options at this time.         Other   Alcohol dependence ([facility])    Recommend continued cutting back -- concern for overall health with ongoing heavy use.  He is pondering rehab, highly recommend he go to [facility] and spend time in detox program.  Is aware of effect of heavy alcohol use on overall health, especially BP.        Relevant Orders   CBC with Differential/Platelet   Comprehensive metabolic panel   Gamma GT   Vitamin B12   Folate   Diarrhea    Refer to GERD plan of care, repeat GGT due to ongoing trend up recent labs, although recent imaging reassuring.  Scheduled to see GI, appreciate their input.      Elevated serum GGT level    Recheck CMP, CBC, GTT today -- may pursue ultrasound to further assess area, recent CT was negative for acute findings.      History of lacunar cerebrovascular accident    History of infarct, continue collaboration with neuro and ENT for ongoing tinnitus, headaches, and  dizziness.        Mixed hyperlipidemia    Chronic, ongoing.  Continue current medication regimen and adjust as needed.  Lipid panel today.       Relevant Orders   Lipid Panel w/o Chol/HDL Ratio   Other Visit Diagnoses     Low income       Referral to Care Guide for propane to heat home.   Relevant Orders   AMB Referral to [facility] Coordinaton   Flu vaccine need       Flu vaccine today   Relevant Orders   Flu Vaccine QUAD 6+ mos PF IM (Fluarix Quad PF) (Completed)  Follow up plan: Return in about 3 months (around 01/04/2022) for HTN, COPD, GERD, ALCOHOL USE, HLD, MOOD.

## 2021-10-04 NOTE — Assessment & Plan Note (Signed)
This was noticed on November 2020 lung CT.  Continue statin therapy + recommend complete cessation of smoking.  Lipid panel today. 

## 2021-10-04 NOTE — Assessment & Plan Note (Signed)
Chronic, ongoing with BP MUCH improved for him today - suspect main exacerbation is his alcohol intake. Continue current medication regimen as prescribed by Dr. Smith Mince at [facility] and continue this collaboration.  Continued alcohol use but is cutting back, continues to be a difficulty in maintaining and controlling BP level.  Recommend cutting back on alcohol and smoking.  Continue collaboration with cardiology. CMP today.  Return in 3 months.

## 2021-10-04 NOTE — Assessment & Plan Note (Signed)
Chronic, ongoing.  Continue current medication regimen and adjust as needed. Lipid panel today. 

## 2021-10-04 NOTE — Assessment & Plan Note (Signed)
Recheck CMP, CBC, GTT today -- may pursue ultrasound to further assess area, recent CT was negative for acute findings. 

## 2021-10-04 NOTE — Assessment & Plan Note (Signed)
Recommend continued cutting back -- concern for overall health with ongoing heavy use.  He is pondering rehab, highly recommend he go to [facility] and spend time in detox program.  Is aware of effect of heavy alcohol use on overall health, especially BP.

## 2021-10-04 NOTE — Assessment & Plan Note (Signed)
Chronic, ongoing.  Continue current inhaler regimen at this time and recommend complete cessation of smoking.  Continue yearly lung screening -- will reach out to team to schedule this as is due.  Consider pulmonary referral in future.

## 2021-10-05 ENCOUNTER — Telehealth: Payer: Self-pay

## 2021-10-05 ENCOUNTER — Encounter: Payer: Self-pay | Admitting: Nurse Practitioner

## 2021-10-05 LAB — CBC WITH DIFFERENTIAL/PLATELET
Basophils Absolute: 0 10*3/uL (ref 0.0–0.2)
Basos: 0 %
EOS (ABSOLUTE): 0.1 10*3/uL (ref 0.0–0.4)
Eos: 1 %
Hematocrit: 45.4 % (ref 37.5–51.0)
Hemoglobin: 15.6 g/dL (ref 13.0–17.7)
Immature Grans (Abs): 0 10*3/uL (ref 0.0–0.1)
Immature Granulocytes: 0 %
Lymphocytes Absolute: 2 10*3/uL (ref 0.7–3.1)
Lymphs: 19 %
MCH: 31 pg (ref 26.6–33.0)
MCHC: 34.4 g/dL (ref 31.5–35.7)
MCV: 90 fL (ref 79–97)
Monocytes Absolute: 0.8 10*3/uL (ref 0.1–0.9)
Monocytes: 7 %
Neutrophils Absolute: 7.5 10*3/uL — ABNORMAL HIGH (ref 1.4–7.0)
Neutrophils: 73 %
Platelets: 382 10*3/uL (ref 150–450)
RBC: 5.03 x10E6/uL (ref 4.14–5.80)
RDW: 12.2 % (ref 11.6–15.4)
WBC: 10.4 10*3/uL (ref 3.4–10.8)

## 2021-10-05 LAB — LIPID PANEL W/O CHOL/HDL RATIO
Cholesterol, Total: 157 mg/dL (ref 100–199)
HDL: 51 mg/dL (ref >39)
LDL Chol Calc (NIH): 68 mg/dL (ref 0–99)
Triglycerides: 232 mg/dL — ABNORMAL HIGH (ref 0–149)
VLDL Cholesterol Cal: 38 mg/dL (ref 5–40)

## 2021-10-05 LAB — COMPREHENSIVE METABOLIC PANEL
ALT: 22 IU/L (ref 0–44)
AST: 24 IU/L (ref 0–40)
Albumin/Globulin Ratio: 1.6 (ref 1.2–2.2)
Albumin: 4.4 g/dL (ref 3.8–4.9)
Alkaline Phosphatase: 104 IU/L (ref 44–121)
BUN/Creatinine Ratio: 16 (ref 9–20)
BUN: 18 mg/dL (ref 6–24)
Bilirubin Total: 0.3 mg/dL (ref 0.0–1.2)
CO2: 21 mmol/L (ref 20–29)
Calcium: 9.5 mg/dL (ref 8.7–10.2)
Chloride: 95 mmol/L — ABNORMAL LOW (ref 96–106)
Creatinine, Ser: 1.14 mg/dL (ref 0.76–1.27)
Globulin, Total: 2.8 g/dL (ref 1.5–4.5)
Glucose: 103 mg/dL — ABNORMAL HIGH (ref 70–99)
Potassium: 4.7 mmol/L (ref 3.5–5.2)
Sodium: 133 mmol/L — ABNORMAL LOW (ref 134–144)
Total Protein: 7.2 g/dL (ref 6.0–8.5)
eGFR: 75 mL/min/{1.73_m2} (ref >59)

## 2021-10-05 LAB — FOLATE: Folate: 7.1 ng/mL (ref >3.0)

## 2021-10-05 LAB — VITAMIN B12: Vitamin B-12: 851 pg/mL (ref 232–1245)

## 2021-10-05 LAB — GAMMA GT: GGT: 68 IU/L — ABNORMAL HIGH (ref 0–65)

## 2021-10-05 LAB — TSH: TSH: 1.01 u[IU]/mL (ref 0.450–4.500)

## 2021-10-05 LAB — MAGNESIUM: Magnesium: 2 mg/dL (ref 1.6–2.3)

## 2021-10-05 NOTE — Telephone Encounter (Signed)
   Telephone encounter was:  Successful.  10/05/2021 Name: Luke Briggs. MRN: [id number] DOB: [birth year]  Luke Briggs. is a 59 y.o. year old male who is a primary care patient of [name], Barbaraann Faster, NP . The community resource team was consulted for assistance with Financial Difficulties related to heating bill.  Care guide performed the following interventions: Spoke with patient completed care manager fund request. [place] they have emailed an invoice to me for [phone]. I have submitted a request to [employer] [facility].  They will let me know if the request has been approved and I will let the patient know.  Follow Up Plan:  Care guide will follow up with patient by phone over the next 7-10 days.  Shaton Lore, AAS Paralegal, [facility]  [address], [place] [address] ??[email or web address]  ?? [phone]   [email or web address]

## 2021-10-05 NOTE — Progress Notes (Signed)
Contacted via MyChart   Good evening Muaad, your labs have returned: - CBC shows no anemia - CMP shows normal kidney, creatinine and eGFR, function + normal liver function, AST and ALT.  Sodium level is a little low which could be related to Hydrochlorothiazide and alcohol use -- continue to work on cutting back. - Cholesterol levels look great, continue Atorvastatin. - Thyroid level is normal, as is magnesium.  B12 and folated are normal. - GGT remains a little elevated, but it is trending down some.  Any questions? Keep being amazing!!  Thank you for allowing me to participate in your care.  I appreciate you. Kindest regards, Sinai Mahany

## 2021-10-08 ENCOUNTER — Telehealth: Payer: Self-pay

## 2021-10-08 NOTE — Telephone Encounter (Signed)
   Telephone encounter was:  Successful.  10/08/2021 Name: Luke Briggs. MRN: [id number] DOB: 1961/01/26  Luke Briggs. is a 60 y.o. year old male who is a primary care patient of [name], Barbaraann Faster, NP . The community resource team was consulted for assistance with  heating oil  Care guide performed the following interventions: Request was sent to [employer] on 10/05/21.  Contacted [name] in [place] the check will go out on 10/10/21 and will be sent directly to [name]. Emailed IT consultant at [name] that the check would be mailed out 11/23 and called patient to update him.  Follow Up Plan:  No further follow up planned at this time. The patient has been provided with needed resources.  [name], AAS Paralegal, [facility]  [address], [place] [address] ??[email or web address]  ?? [phone]   [email or web address]

## 2021-10-09 ENCOUNTER — Other Ambulatory Visit: Payer: Self-pay | Admitting: *Deleted

## 2021-10-09 DIAGNOSIS — F17210 Nicotine dependence, cigarettes, uncomplicated: Secondary | ICD-10-CM

## 2021-10-09 DIAGNOSIS — Z87891 Personal history of nicotine dependence: Secondary | ICD-10-CM

## 2021-10-10 ENCOUNTER — Ambulatory Visit (INDEPENDENT_AMBULATORY_CARE_PROVIDER_SITE_OTHER): Payer: Medicaid Other | Admitting: Gastroenterology

## 2021-10-10 VITALS — BP 133/88 | HR 86 | Temp 98.4°F | Ht 70.0 in | Wt 200.0 lb

## 2021-10-10 DIAGNOSIS — K219 Gastro-esophageal reflux disease without esophagitis: Secondary | ICD-10-CM

## 2021-10-10 DIAGNOSIS — R1013 Epigastric pain: Secondary | ICD-10-CM | POA: Diagnosis not present

## 2021-10-10 DIAGNOSIS — R197 Diarrhea, unspecified: Secondary | ICD-10-CM

## 2021-10-10 NOTE — Addendum Note (Signed)
Addended by: Lurlean Nanny on: 10/10/2021 04:03 PM   Modules accepted: Orders

## 2021-10-10 NOTE — Progress Notes (Signed)
Luke Briggs [address]  [place]  [place], [place] [address]  Main: [phone]  Fax: [phone]   Gastroenterology Consultation  Referring Provider:     [name], NP Primary Care Physician:  [name], NP Reason for Consultation:     Epigastric pain        HPI:   Chief plaint: Abdominal pain  Luke Briggs. is a 60 y.o. y/o male referred for consultation & management  by Dr. [name], NP.  Patient reports 1 year history of epigastric pain, reproducible on palpation.  States its mostly in the morning.  States he drinks beer and it goes away.  Drinks 8-12 beers a day and has been doing so for years.  Denies any history of cirrhosis.  Pain is unrelated to meals.  Also reports daily emesis, but no hematemesis.  Also reports chronic reflux and has been on omeprazole for over 10 years.  Omeprazole has not helped the pain.  PCP notes reviewed from 10/04/2021 and notes history of GERD, and the patient is on omeprazole.  Continue to have ongoing abdominal pain despite this.  Does also report that patient has had diarrhea.    Most recent labs including CBC CMP reassuring with normal liver enzymes and no anemia on CBC.  CT abdomen pelvis without contrast in August done for epigastric and right upper quadrant pain for 6 months, showed colonic diverticulosis, and no acute findings.  Stable small periumbilical ventral hernia containing only fat was reported.  Gallbladder was noted to be unremarkable  Medication list also reveals that patient is on Amitiza  Patient underwent screening colonoscopy with Dr. Vicente Males in January 2022 with 2 polyps removed, largest being 10 mm in size.  Diverticulosis noted.  Pathology showed tubular adenoma repeat recommended in 3 years.  Past Medical History:  Diagnosis Date   Angioedema 08/07/2019   Arthritis    knees,shoulders, ankles, most joints   Cervical disc disorder    difficuly moving neck left   COPD  (chronic obstructive pulmonary disease) (HCC)    chronic cough and wheezing   Dyspnea    easily   GERD (gastroesophageal reflux disease)    Hemorrhoids    History of hiatal hernia    Hypertension    controlled on meds   Pneumonia 04/2019   Prostate disorder    PTSD (post-traumatic stress disorder)    Schizophrenia ([place])    Per patient's report.    Past Surgical History:  Procedure Laterality Date   CATARACT EXTRACTION W/PHACO Left 03/28/2020   Procedure: CATARACT EXTRACTION PHACO AND INTRAOCULAR LENS PLACEMENT ([place]) LEFT MALYUGIN  MILOOP,   5.29  00:41.0;  Surgeon: Birder Robson, MD;  Location: [facility];  Service: Ophthalmology;  Laterality: Left;   COLONOSCOPY WITH PROPOFOL N/A 12/14/2020   Procedure: COLONOSCOPY WITH PROPOFOL;  Surgeon: Jonathon Bellows, MD;  Location[facility] ENDOSCOPY;  Service: Gastroenterology;  Laterality: N/A;   EYE SURGERY Left    injury from a nail   FOOT SURGERY Left     Prior to Admission medications   Medication Sig Start Date End Date Taking? Authorizing Provider  albuterol (VENTOLIN HFA) 108 (90 Base) MCG/ACT inhaler Inhale 2 puffs into the lungs every 6 (six) hours as needed for wheezing orshortness of breath. 03/27/21   [name], [name], NP  atorvastatin (LIPITOR) 10 MG tablet Take 1 tablet (10 mg total) by mouth daily. 03/27/21   [name], [name], NP  EPINEPHrine 0.3 mg/0.3 mL  IJ SOAJ injection Inject 0.3 mLs (0.3 mg total) into the muscle as needed for anaphylaxis. 08/09/19   Gouru, Illene Silver, MD  finasteride (PROSCAR) 5 MG tablet Take 1 tablet (5 mg total) by mouth daily. 03/27/21   [name], [name], NP  hydrocortisone cream 0.5 % Apply 1 application topically 2 (two) times daily. 06/05/21   [name], [name], NP  Ipratropium-Albuterol (COMBIVENT) 20-100 MCG/ACT AERS respimat Inhale 1 puff into the lungs every 6 (six) hours. 03/27/21   [name], [name], NP  lubiprostone (AMITIZA) 24 MCG capsule Take 1 capsule (24 mcg total) by mouth 2 (two) times  daily with a meal. 06/05/21   [name], [name] T, NP  montelukast (SINGULAIR) 10 MG tablet Take 1 tablet (10 mg total) by mouth at bedtime. 03/27/21   [name], [name], NP  olmesartan-hydrochlorothiazide (BENICAR HCT) 20-12.5 MG tablet Take 1 tablet by mouth daily. 05/18/21   [provider]  omeprazole (PRILOSEC) 40 MG capsule Take 1 capsule (40 mg total) by mouth daily. 03/27/21   [name], [name], NP  Rimegepant Sulfate (NURTEC) 75 MG TBDP Take 75 mg by mouth daily as needed (for migraine as needed only). 07/26/21   [name], [name], NP  tadalafil (CIALIS) 5 MG tablet Take 1 tablet (5 mg total) by mouth daily as needed for erectile dysfunction. 05/10/21   Stoioff, Ronda Fairly, MD  tamsulosin (FLOMAX) 0.4 MG CAPS capsule Take 1 capsule (0.4 mg total) by mouth daily. 06/28/21   [name], [name], NP  Tiotropium Bromide-Olodaterol (STIOLTO RESPIMAT) 2.5-2.5 MCG/ACT AERS Inhale 2 puffs into the lungs daily. 03/27/21   [name], [name], NP  tiZANidine (ZANAFLEX) 4 MG tablet Take 1 tablet (4 mg total) by mouth every 6 (six) hours as needed for muscle spasms. 06/05/21   [name], [name], NP  topiramate (TOPAMAX) 25 MG tablet Start out at 25 MG (one tablet) by mouth daily for one week and then increase to 50 MG (two tablets) by mouth daily. 07/26/21   [name], [name], NP  triamcinolone cream (KENALOG) 0.1 % Apply 1 application topically 2 (two) times daily. 03/27/21   [name], [name], NP  carvedilol (COREG) 3.125 MG tablet Take 1 tablet (3.125 mg total) by mouth 2 (two) times daily. 07/03/20 08/10/20  Kate Sable, MD    Family History  Problem Relation Age of Onset   Heart disease Mother    Osteoporosis Mother    Heart disease Father    Emphysema Father    Heart disease Sister    Emphysema Maternal Grandmother    Heart disease Maternal Grandfather    Osteoporosis Sister      Social History   Tobacco Use   Smoking status: Every Day    Packs/day: 1.00    Years: 43.00    Pack years: 43.00     Types: Cigarettes   Smokeless tobacco: Never  Vaping Use   Vaping Use: Never used  Substance Use Topics   Alcohol use: Yes    Alcohol/week: 8.0 standard drinks    Types: 8 Cans of beer per week    Comment: beer   Drug use: Never    Allergies as of 10/10/2021 - Review Complete 10/04/2021  Allergen Reaction Noted   Lisinopril Swelling 08/08/2019   Amlodipine  10/08/2019   Penicillins Rash 04/28/2019    Review of Systems:    All systems reviewed and negative except where noted in HPI.   Physical Exam:   Vitals:   10/10/21 1309  BP: 133/88  Pulse: 86  Temp: 98.4 F (36.9 C)  TempSrc: Oral  Weight: 200 lb (90.7 kg)  Height: 5\' 10"  (1.778 m)    No LMP for male patient. Psych:  Alert and cooperative. Normal mood and affect. General:   Alert,  Well-developed, well-nourished, pleasant and cooperative in NAD Head:  Normocephalic and atraumatic. Eyes:  Sclera clear, no icterus.   Conjunctiva pink. Ears:  Normal auditory acuity. Nose:  No deformity, discharge, or lesions. Mouth:  No deformity or lesions,oropharynx pink & moist. Neck:  Supple; no masses or thyromegaly. Abdomen:  Normal bowel sounds.  Tenderness to, 2 finger superficial palpation epigastric region, under xiphoid notch  Soft, non-tender and non-distended without masses, hepatosplenomegaly or hernias noted.  No guarding or rebound tenderness.    Msk:  Symmetrical without gross deformities. Good, equal movement & strength bilaterally. Pulses:  Normal pulses noted. Extremities:  No clubbing or edema.  No cyanosis. Neurologic:  Alert and oriented x3;  grossly normal neurologically. Skin:  Intact without significant lesions or rashes. No jaundice. Lymph Nodes:  No significant cervical adenopathy. Psych:  Alert and cooperative. Normal mood and affect.   Labs: CBC    Component Value Date/Time   WBC 10.4 10/04/2021 0919   WBC 12.6 (H) 10/30/2020 1604   RBC 5.03 10/04/2021 0919   RBC 5.73 10/30/2020 1604    HGB 15.6 10/04/2021 0919   HCT 45.4 10/04/2021 0919   PLT 382 10/04/2021 0919   MCV 90 10/04/2021 0919   MCH 31.0 10/04/2021 0919   MCH 31.4 10/30/2020 1604   MCHC 34.4 10/04/2021 0919   MCHC 33.6 10/30/2020 1604   RDW 12.2 10/04/2021 0919   LYMPHSABS 2.0 10/04/2021 0919   MONOABS 0.4 10/30/2020 1604   EOSABS 0.1 10/04/2021 0919   BASOSABS 0.0 10/04/2021 0919   CMP     Component Value Date/Time   NA 133 (L) 10/04/2021 0919   K 4.7 10/04/2021 0919   CL 95 (L) 10/04/2021 0919   CO2 21 10/04/2021 0919   GLUCOSE 103 (H) 10/04/2021 0919   GLUCOSE 114 (H) 10/30/2020 1604   BUN 18 10/04/2021 0919   CREATININE 1.14 10/04/2021 0919   CALCIUM 9.5 10/04/2021 0919   PROT 7.2 10/04/2021 0919   ALBUMIN 4.4 10/04/2021 0919   AST 24 10/04/2021 0919   ALT 22 10/04/2021 0919   ALKPHOS 104 10/04/2021 0919   BILITOT 0.3 10/04/2021 0919   GFRNONAA 98 01/10/2021 1502   GFRNONAA >60 10/30/2020 1604   GFRAA 113 01/10/2021 1502    Imaging Studies: No results found.  Assessment and Plan:   Mohan Erven. is a 60 y.o. y/o male has been referred for epigastric pain for over 6 months despite omeprazole  Epigastric pain that is likely musculoskeletal in etiology and from costochondritis based on history and physical exam  Ice packs 3 times a day recommended Referral to pain management discussed for possible steroid injection to the area given ongoing pain for a year.  Patient refuses.  PCP can readdress this in the future if symptoms continue and consider pain management referral   However, patient has had chronic GERD and has been on omeprazole for years.  Reports having an EGD colonoscopy 5 years ago in [place].  Procedure report not available.  We did discuss EGD due to chronic GERD symptoms, and obtaining biopsies for H. pylori given patient is also having some epigastric pain (although it is likely musculoskeletal), versus conservative management  At this time.  Patient would like  to  proceed with EGD   we will try to obtain procedure reports from 5 years ago   colonoscopy up-to-date  Patient stopped taking Amitiza about 3 months ago and was previously constipated  However, recently has had to 3 loose stools a day without blood.  Attributes this to his alcohol intake.  He was advised to decrease and completely abstain from alcohol intake  Will also obtain fecal calprotectin and fecal elastase due to loose stools  I have discussed alternative options, risks & benefits,  which include, but are not limited to, bleeding, infection, perforation,respiratory complication & drug reaction.  The patient agrees with this plan & written consent will be obtained.     Dr Luke Briggs  Speech recognition software was used to dictate the above note.

## 2021-10-18 ENCOUNTER — Encounter: Payer: Self-pay | Admitting: Gastroenterology

## 2021-10-18 ENCOUNTER — Ambulatory Visit: Payer: Medicaid Other | Admitting: Anesthesiology

## 2021-10-18 ENCOUNTER — Ambulatory Visit
Admission: RE | Admit: 2021-10-18 | Discharge: 2021-10-18 | Disposition: A | Discharge status: Home / Self Care | Payer: Medicaid Other | Attending: Gastroenterology | Admitting: Gastroenterology

## 2021-10-18 ENCOUNTER — Encounter
Admission: RE | Disposition: A | Discharge status: Home / Self Care | Payer: Self-pay | Source: Home / Self Care | Attending: Gastroenterology

## 2021-10-18 DIAGNOSIS — K319 Disease of stomach and duodenum, unspecified: Secondary | ICD-10-CM

## 2021-10-18 DIAGNOSIS — Z8673 Personal history of transient ischemic attack (TIA), and cerebral infarction without residual deficits: Secondary | ICD-10-CM | POA: Insufficient documentation

## 2021-10-18 DIAGNOSIS — K219 Gastro-esophageal reflux disease without esophagitis: Secondary | ICD-10-CM | POA: Diagnosis not present

## 2021-10-18 DIAGNOSIS — K2960 Other gastritis without bleeding: Secondary | ICD-10-CM | POA: Insufficient documentation

## 2021-10-18 DIAGNOSIS — F17210 Nicotine dependence, cigarettes, uncomplicated: Secondary | ICD-10-CM | POA: Insufficient documentation

## 2021-10-18 DIAGNOSIS — J449 Chronic obstructive pulmonary disease, unspecified: Secondary | ICD-10-CM | POA: Diagnosis not present

## 2021-10-18 DIAGNOSIS — I10 Essential (primary) hypertension: Secondary | ICD-10-CM | POA: Diagnosis not present

## 2021-10-18 DIAGNOSIS — K2100 Gastro-esophageal reflux disease with esophagitis, without bleeding: Secondary | ICD-10-CM | POA: Insufficient documentation

## 2021-10-18 DIAGNOSIS — R12 Heartburn: Secondary | ICD-10-CM | POA: Insufficient documentation

## 2021-10-18 DIAGNOSIS — K209 Esophagitis, unspecified without bleeding: Secondary | ICD-10-CM | POA: Diagnosis not present

## 2021-10-18 DIAGNOSIS — K3189 Other diseases of stomach and duodenum: Secondary | ICD-10-CM | POA: Diagnosis not present

## 2021-10-18 DIAGNOSIS — F4310 Post-traumatic stress disorder, unspecified: Secondary | ICD-10-CM | POA: Insufficient documentation

## 2021-10-18 DIAGNOSIS — F419 Anxiety disorder, unspecified: Secondary | ICD-10-CM | POA: Diagnosis not present

## 2021-10-18 DIAGNOSIS — K2289 Other specified disease of esophagus: Secondary | ICD-10-CM | POA: Insufficient documentation

## 2021-10-18 DIAGNOSIS — K449 Diaphragmatic hernia without obstruction or gangrene: Secondary | ICD-10-CM | POA: Insufficient documentation

## 2021-10-18 DIAGNOSIS — R109 Unspecified abdominal pain: Secondary | ICD-10-CM | POA: Insufficient documentation

## 2021-10-18 DIAGNOSIS — M1990 Unspecified osteoarthritis, unspecified site: Secondary | ICD-10-CM | POA: Diagnosis not present

## 2021-10-18 HISTORY — PX: ESOPHAGOGASTRODUODENOSCOPY (EGD) WITH PROPOFOL: SHX5813

## 2021-10-18 SURGERY — ESOPHAGOGASTRODUODENOSCOPY (EGD) WITH PROPOFOL
Anesthesia: General

## 2021-10-18 MED ORDER — PROPOFOL 500 MG/50ML IV EMUL
INTRAVENOUS | Status: AC
  Filled 2021-10-18: qty 50

## 2021-10-18 MED ORDER — LIDOCAINE HCL (CARDIAC) PF 100 MG/5ML IV SOSY
PREFILLED_SYRINGE | INTRAVENOUS | Status: DC | PRN
  Administered 2021-10-18: 100 mg via INTRAVENOUS

## 2021-10-18 MED ORDER — PROPOFOL 500 MG/50ML IV EMUL
INTRAVENOUS | Status: DC | PRN
  Administered 2021-10-18: 150 ug/kg/min via INTRAVENOUS

## 2021-10-18 MED ORDER — OMEPRAZOLE 40 MG PO CPDR: 40.0000 mg | DELAYED_RELEASE_CAPSULE | Freq: Two times a day (BID) | ORAL | 0 refills | Status: DC

## 2021-10-18 MED ORDER — LIDOCAINE HCL (PF) 2 % IJ SOLN
INTRAMUSCULAR | Status: AC
  Filled 2021-10-18: qty 5

## 2021-10-18 MED ORDER — MIDAZOLAM HCL 2 MG/2ML IJ SOLN
INTRAMUSCULAR | Status: DC | PRN
  Administered 2021-10-18: 2 mg via INTRAVENOUS

## 2021-10-18 MED ORDER — MIDAZOLAM HCL 2 MG/2ML IJ SOLN
INTRAMUSCULAR | Status: AC
  Filled 2021-10-18: qty 2

## 2021-10-18 MED ORDER — SODIUM CHLORIDE 0.9 % IV SOLN: INTRAVENOUS | Status: DC

## 2021-10-18 NOTE — Transfer of Care (Signed)
Immediate Anesthesia Transfer of Care Note  Patient: Luke Briggs.  Procedure(s) Performed: ESOPHAGOGASTRODUODENOSCOPY (EGD) WITH PROPOFOL  Patient Location: PACU  Anesthesia Type:General  Level of Consciousness: awake and sedated  Airway & Oxygen Therapy: Patient Spontanous Breathing and Patient connected to nasal cannula oxygen  Post-op Assessment: Report given to RN and Post -op Vital signs reviewed and stable  Post vital signs: Reviewed and stable  Last Vitals:  Vitals Value Taken Time  BP    Temp    Pulse    Resp    SpO2      Last Pain:  Vitals:   10/18/21 0914  TempSrc: Temporal  PainSc: 0-No pain         Complications: No notable events documented.

## 2021-10-18 NOTE — Op Note (Signed)
[facility] Gastroenterology Patient Name: Luke Briggs Procedure Date: 10/18/2021 9:56 AM MRN: [id number] Account #: [id number] Date of Birth: 04-Jul-1961 Admit Type: Outpatient Age: 60 Room: [facility] ENDO ROOM 3 Gender: Male Note Status: Finalized Instrument Name: Upper Endoscope [id number] Procedure:             Upper GI endoscopy Indications:           Abdominal pain, Heartburn Providers:             Aurelie Dicenzo B. Bonna Gains MD, MD Referring MD:          Barbaraann Faster. Ned Card (Referring MD) Medicines:             Monitored Anesthesia Care Complications:         No immediate complications. Procedure:             Pre-Anesthesia Assessment:                        - Prior to the procedure, a History and Physical was                         performed, and patient medications, allergies and                         sensitivities were reviewed. The patient's tolerance                         of previous anesthesia was reviewed.                        - The risks and benefits of the procedure and the                         sedation options and risks were discussed with the                         patient. All questions were answered and informed                         consent was obtained.                        - Patient identification and proposed procedure were                         verified prior to the procedure by the physician, the                         nurse, the anesthesiologist, the anesthetist and the                         technician. The procedure was verified in the                         procedure room.                        - ASA Grade Assessment: II - A patient with mild  systemic disease.                        After obtaining informed consent, the endoscope was                         passed under direct vision. Throughout the procedure,                         the patient's blood pressure, pulse, and oxygen                          saturations were monitored continuously. The Endoscope                         was introduced through the mouth, and advanced to the                         second part of duodenum. The upper GI endoscopy was                         accomplished with ease. The patient tolerated the                         procedure well. Findings:      LA Grade A (one or more mucosal breaks less than 5 mm, not extending       between tops of 2 mucosal folds) esophagitis with no bleeding was found       at the gastroesophageal junction.      Salmon-colored mucosa was present. The maximum longitudinal extent of       these esophageal mucosal changes was 1 cm in length. Biopsies were taken       with a cold forceps for histology. Please note that esophagitis was only       present in a localized area. The biopsies of the salmon colored mucosa       were done away from the area of esophagitis and did not include the       small area of esophagitis itself (to prevent "overread/overdiagnosis" of       metaplasia in the setting of inflammatory changes from esophagitis).      The exam of the esophagus was otherwise normal.      Patchy mildly erythematous mucosa without bleeding was found in the       gastric antrum. Biopsies were taken with a cold forceps for histology.       Biopsies were obtained in the gastric body, at the incisura and in the       gastric antrum with cold forceps for histology.      A few erosions with no bleeding and no stigmata of recent bleeding were       found in the gastric antrum. Biopsies were taken with a cold forceps for       histology.      A small hiatal hernia was present.      The exam of the stomach was otherwise normal.      The duodenal bulb, second portion of the duodenum and examined duodenum       were normal. Impression:            - LA Grade A reflux esophagitis  with no bleeding.                        - Salmon-colored mucosa suspicious for short-segment                          Barrett's esophagus. Biopsied.                        - Erythematous mucosa in the antrum. Biopsied.                        - Erosive gastropathy with no bleeding and no stigmata                         of recent bleeding. Biopsied.                        - Small hiatal hernia.                        - Normal duodenal bulb, second portion of the duodenum                         and examined duodenum.                        - Biopsies were obtained in the gastric body, at the                         incisura and in the gastric antrum. Recommendation:        - Take prescribed proton pump inhibitor or H2 blocker                         (antacid) medications 30 - 60 minutes before meals.                        - Follow an antireflux regimen.                        - Await pathology results.                        - Discharge patient to home (with escort).                        - Advance diet as tolerated.                        - Continue present medications.                        - Patient has a contact number available for                         emergencies. The signs and symptoms of potential                         delayed complications were discussed with the patient.  Return to normal activities tomorrow. Written                         discharge instructions were provided to the patient.                        - Discharge patient to home (with escort).                        - The findings and recommendations were discussed with                         the patient.                        - The findings and recommendations were discussed with                         the patient's family. Procedure Code(s):     --- Professional ---                        [phone], Esophagogastroduodenoscopy, flexible,                         transoral; with biopsy, single or multiple Diagnosis Code(s):     --- Professional ---                        K21.00,  Gastro-esophageal reflux disease with                         esophagitis, without bleeding                        K22.8, Other specified diseases of esophagus                        K31.89, Other diseases of stomach and duodenum                        K44.9, Diaphragmatic hernia without obstruction or                         gangrene                        R10.9, Unspecified abdominal pain                        R12, Heartburn CPT copyright 2019 [employer]. All rights reserved. The codes documented in this report are preliminary and upon coder review may  be revised to meet current compliance requirements.  Vonda Antigua, MD Margretta Sidle B. Bonna Gains MD, MD 10/18/2021 10:33:12 AM This report has been signed electronically. Number of Addenda: 0 Note Initiated On: 10/18/2021 9:56 AM Estimated Blood Loss:  Estimated blood loss: none.      [facility]

## 2021-10-18 NOTE — H&P (Signed)
Vonda Antigua, MD [address], [place], [place], [place], [address] [id number], [place], [place], [place], [address] Phone: [phone]  Fax: [phone]  Primary Care Physician:  [name], NP   Pre-Procedure History & Physical: HPI:  Luke Briggs. is a 60 y.o. male is here for an EGD.   Past Medical History:  Diagnosis Date   Angioedema 08/07/2019   Arthritis    knees,shoulders, ankles, most joints   Cervical disc disorder    difficuly moving neck left   COPD (chronic obstructive pulmonary disease) (HCC)    chronic cough and wheezing   Dyspnea    easily   GERD (gastroesophageal reflux disease)    Hemorrhoids    History of hiatal hernia    Hypertension    controlled on meds   Pneumonia 04/2019   Prostate disorder    PTSD (post-traumatic stress disorder)    Schizophrenia ([place])    Per patient's report.    Past Surgical History:  Procedure Laterality Date   CATARACT EXTRACTION W/PHACO Left 03/28/2020   Procedure: CATARACT EXTRACTION PHACO AND INTRAOCULAR LENS PLACEMENT ([place]) LEFT MALYUGIN  MILOOP,   5.29  00:41.0;  Surgeon: Birder Robson, MD;  Location: [facility];  Service: Ophthalmology;  Laterality: Left;   COLONOSCOPY WITH PROPOFOL N/A 12/14/2020   Procedure: COLONOSCOPY WITH PROPOFOL;  Surgeon: Jonathon Bellows, MD;  Location: [facility] ENDOSCOPY;  Service: Gastroenterology;  Laterality: N/A;   EYE SURGERY Left    injury from a nail   FOOT SURGERY Left     Prior to Admission medications   Medication Sig Start Date End Date Taking? Authorizing Provider  atorvastatin (LIPITOR) 10 MG tablet Take 1 tablet (10 mg total) by mouth daily. 03/27/21  Yes [name], [name] T, NP  finasteride (PROSCAR) 5 MG tablet Take 1 tablet (5 mg total) by mouth daily. 03/27/21  Yes [name], [name] T, NP  olmesartan-hydrochlorothiazide (BENICAR HCT) 20-12.5 MG tablet Take 1 tablet by mouth daily. 05/18/21  Yes [provider]  omeprazole (PRILOSEC) 40 MG  capsule Take 1 capsule (40 mg total) by mouth daily. 03/27/21  Yes [name], [name] T, NP  tamsulosin (FLOMAX) 0.4 MG CAPS capsule Take 1 capsule (0.4 mg total) by mouth daily. 06/28/21  Yes [name], [name] T, NP  albuterol (VENTOLIN HFA) 108 (90 Base) MCG/ACT inhaler Inhale 2 puffs into the lungs every 6 (six) hours as needed for wheezing orshortness of breath. 03/27/21   [name], [name], NP  EPINEPHrine 0.3 mg/0.3 mL IJ SOAJ injection Inject 0.3 mLs (0.3 mg total) into the muscle as needed for anaphylaxis. 08/09/19   Nicholes Mango, MD  hydrocortisone cream 0.5 % Apply 1 application topically 2 (two) times daily. 06/05/21   [name], [name], NP  Ipratropium-Albuterol (COMBIVENT) 20-100 MCG/ACT AERS respimat Inhale 1 puff into the lungs every 6 (six) hours. Patient not taking: Reported on 10/18/2021 03/27/21   Marnee Guarneri T, NP  montelukast (SINGULAIR) 10 MG tablet Take 1 tablet (10 mg total) by mouth at bedtime. Patient not taking: Reported on 10/18/2021 03/27/21   Marnee Guarneri T, NP  tadalafil (CIALIS) 5 MG tablet Take 1 tablet (5 mg total) by mouth daily as needed for erectile dysfunction. 05/10/21   Stoioff, Ronda Fairly, MD  Tiotropium Bromide-Olodaterol (STIOLTO RESPIMAT) 2.5-2.5 MCG/ACT AERS Inhale 2 puffs into the lungs daily. 03/27/21   [name], [name], NP  topiramate (TOPAMAX) 25 MG tablet Start out at 25 MG (one tablet) by mouth daily for one week and then  increase to 50 MG (two tablets) by mouth daily. Patient not taking: Reported on 10/18/2021 07/26/21   Marnee Guarneri T, NP  carvedilol (COREG) 3.125 MG tablet Take 1 tablet (3.125 mg total) by mouth 2 (two) times daily. 07/03/20 08/10/20  Kate Sable, MD    Allergies as of 10/10/2021 - Review Complete 10/10/2021  Allergen Reaction Noted   Lisinopril Swelling 08/08/2019   Amlodipine  10/08/2019   Penicillins Rash 04/28/2019    Family History  Problem Relation Age of Onset   Heart disease Mother    Osteoporosis Mother    Heart  disease Father    Emphysema Father    Heart disease Sister    Emphysema Maternal Grandmother    Heart disease Maternal Grandfather    Osteoporosis Sister     Social History   Socioeconomic History   Marital status: Significant Other    Spouse name: Not on file   Number of children: Not on file   Years of education: Not on file   Highest education level: Not on file  Occupational History   Not on file  Tobacco Use   Smoking status: Every Day    Packs/day: 1.00    Years: 43.00    Pack years: 43.00    Types: Cigarettes   Smokeless tobacco: Never  Vaping Use   Vaping Use: Never used  Substance and Sexual Activity   Alcohol use: Yes    Alcohol/week: 8.0 standard drinks    Types: 8 Cans of beer per week    Comment: beer   Drug use: Never   Sexual activity: Not on file  Other Topics Concern   Not on file  Social History Narrative   Patient has new girlfriend they have been dating almost a year.    Social Determinants of Health   Financial Resource Strain: Medium Risk   Difficulty of Paying Living Expenses: Somewhat hard  Food Insecurity: No Food Insecurity   Worried About Charity fundraiser in the Last Year: Never true   Ran Out of Food in the Last Year: Never true  Transportation Needs: No Transportation Needs   Lack of Transportation (Medical): No   Lack of Transportation (Non-Medical): No  Physical Activity: Inactive   Days of Exercise per Week: 0 days   Minutes of Exercise per Session: 0 min  Stress: Stress Concern Present   Feeling of Stress : To some extent  Social Connections: Moderately Isolated   Frequency of Communication with Friends and Family: More than three times a week   Frequency of Social Gatherings with Friends and Family: More than three times a week   Attends Religious Services: Never   Marine scientist or Organizations: No   Attends Music therapist: Never   Marital Status: Living with partner  Intimate Partner Violence:  Not At Risk   Fear of Current or Ex-Partner: No   Emotionally Abused: No   Physically Abused: No   Sexually Abused: No    Review of Systems: See HPI, otherwise negative ROS  Constitutional: General:   Alert,  Well-developed, well-nourished, pleasant and cooperative in NAD BP (!) 155/106   Pulse 89   Temp 97.8 F (36.6 C) (Temporal)   Resp 18   Ht 5\' 10"  (1.778 m)   Wt 90.7 kg   SpO2 97%   BMI 28.70 kg/m   Head: Normocephalic, atraumatic.   Eyes:  Sclera clear, no icterus.   Conjunctiva pink.   Mouth:  No deformity or  lesions, oropharynx pink & moist.  Neck:  Supple, trachea midline  Respiratory: Normal respiratory effort  Gastrointestinal:  Soft, non-tender and non-distended without masses, hepatosplenomegaly or hernias noted.  No guarding or rebound tenderness.     Cardiac: No clubbing or edema.  No cyanosis. Normal posterior tibial pedal pulses noted.  Lymphatic:  No significant cervical adenopathy.  Psych:  Alert and cooperative. Normal mood and affect.  Musculoskeletal:   Symmetrical without gross deformities. 5/5 Lower extremity strength bilaterally.  Skin: Warm. Intact without significant lesions or rashes. No jaundice.  Neurologic:  Face symmetrical, tongue midline, Normal sensation to touch;  grossly normal neurologically.  Psych:  Alert and oriented x3, Alert and cooperative. Normal mood and affect.  Impression/Plan: Luke Briggs. is here for an EGD for Acid Reflux,epigastric pain.  Risks, benefits, limitations, and alternatives regarding the procedure have been reviewed with the patient.  Questions have been answered.  All parties agreeable.   Virgel Manifold, MD  10/18/2021, 9:18 AM

## 2021-10-18 NOTE — Anesthesia Preprocedure Evaluation (Signed)
Anesthesia Evaluation  Patient identified by MRN, date of birth, ID band Patient awake    Reviewed: Allergy & Precautions, NPO status , Patient's Chart, lab work & pertinent test results  History of Anesthesia Complications Negative for: history of anesthetic complications  Airway Mallampati: II   Neck ROM: Full    Dental  (+)    Pulmonary COPD, Current Smoker (1.5 ppd)Patient did not abstain from smoking.,    Pulmonary exam normal breath sounds clear to auscultation       Cardiovascular hypertension, Normal cardiovascular exam Rhythm:Regular Rate:Normal  ECG 04/05/21: normal   Neuro/Psych PSYCHIATRIC DISORDERS (PTSD) Anxiety Schizophrenia Alcohol use disorder, 8 beers on most days, last intake 10/17/21 CVA (pt reports found on imaging, no residual deficits)    GI/Hepatic hiatal hernia, GERD  ,  Endo/Other  negative endocrine ROS  Renal/GU negative Renal ROS     Musculoskeletal  (+) Arthritis ,   Abdominal   Peds  Hematology negative hematology ROS (+)   Anesthesia Other Findings Reviewed 06/29/21 cardiology note  Reproductive/Obstetrics                             Anesthesia Physical Anesthesia Plan  ASA: 3  Anesthesia Plan: General   Post-op Pain Management:    Induction: Intravenous  PONV Risk Score and Plan: 1 and Propofol infusion, TIVA and Treatment may vary due to age or medical condition  Airway Management Planned: Natural Airway  Additional Equipment:   Intra-op Plan:   Post-operative Plan:   Informed Consent: I have reviewed the patients History and Physical, chart, labs and discussed the procedure including the risks, benefits and alternatives for the proposed anesthesia with the patient or authorized representative who has indicated his/her understanding and acceptance.       Plan Discussed with: CRNA  Anesthesia Plan Comments: (LMA/GETA backup discussed.   Patient consented for risks of anesthesia including but not limited to:  - adverse reactions to medications - damage to eyes, teeth, lips or other oral mucosa - nerve damage due to positioning  - sore throat or hoarseness - damage to heart, brain, nerves, lungs, other parts of body or loss of life  Informed patient about role of CRNA in peri- and intra-operative care.  Patient voiced understanding.)        Anesthesia Quick Evaluation

## 2021-10-18 NOTE — Anesthesia Procedure Notes (Signed)
Date/Time: 10/18/2021 10:02 AM Performed by: Vaughan Sine Pre-anesthesia Checklist: Patient identified, Emergency Drugs available, Suction available, Patient being monitored and Timeout performed Patient Re-evaluated:Patient Re-evaluated prior to induction Oxygen Delivery Method: Nasal cannula Preoxygenation: Pre-oxygenation with 100% oxygen Induction Type: IV induction Airway Equipment and Method: Bite block Placement Confirmation: positive ETCO2 and CO2 detector

## 2021-10-18 NOTE — Anesthesia Postprocedure Evaluation (Signed)
Anesthesia Post Note  Patient: Luke Briggs.  Procedure(s) Performed: ESOPHAGOGASTRODUODENOSCOPY (EGD) WITH PROPOFOL  Patient location during evaluation: PACU Anesthesia Type: General Level of consciousness: awake and alert, oriented and patient cooperative Pain management: pain level controlled Vital Signs Assessment: post-procedure vital signs reviewed and stable Respiratory status: spontaneous breathing, nonlabored ventilation and respiratory function stable Cardiovascular status: blood pressure returned to baseline and stable Postop Assessment: adequate PO intake Anesthetic complications: no   No notable events documented.   Last Vitals:  Vitals:   10/18/21 1039 10/18/21 1049  BP: (!) 150/95 (!) 165/99  Pulse: 74 77  Resp: 18 19  Temp:    SpO2: 99% 100%    Last Pain:  Vitals:   10/18/21 1049  TempSrc:   PainSc: 0-No pain                 Darrin Nipper

## 2021-10-19 ENCOUNTER — Encounter: Payer: Self-pay | Admitting: Gastroenterology

## 2021-10-19 LAB — SURGICAL PATHOLOGY

## 2021-10-23 ENCOUNTER — Encounter: Payer: Self-pay | Admitting: Gastroenterology

## 2021-10-23 ENCOUNTER — Ambulatory Visit
Admission: RE | Admit: 2021-10-23 | Discharge: 2021-10-23 | Disposition: A | Discharge status: Home / Self Care | Payer: Medicaid Other | Source: Ambulatory Visit | Attending: Acute Care | Admitting: Acute Care

## 2021-10-23 ENCOUNTER — Other Ambulatory Visit: Payer: Self-pay

## 2021-10-23 DIAGNOSIS — F17210 Nicotine dependence, cigarettes, uncomplicated: Secondary | ICD-10-CM | POA: Insufficient documentation

## 2021-10-23 DIAGNOSIS — Z87891 Personal history of nicotine dependence: Secondary | ICD-10-CM | POA: Insufficient documentation

## 2021-10-23 IMAGING — CT CT CHEST LUNG CANCER SCREENING LOW DOSE W/O CM
2 of 5 series · 15 of 40 positions shown, 18 images · non-contrast
Comparison: [DATE]

CLINICAL DATA: 60-year-old male with 46 pack-year history of
smoking. Lung cancer screening.

EXAM:
CT CHEST WITHOUT CONTRAST LOW-DOSE FOR LUNG CANCER SCREENING
TECHNIQUE: Multidetector CT imaging of the chest was performed following the
standard protocol without IV contrast.

[Series 3: lung 1.00 · axial · 0.70mm/px · z∈[-1239,-898]mm · 12 of 377 slices shown, 15 images]
[im 18/377  mediastinal]
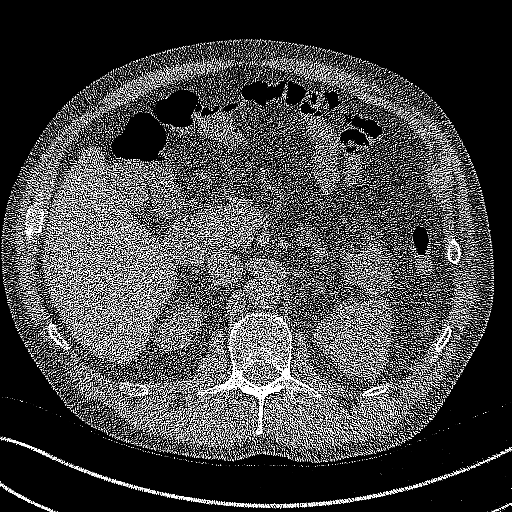
[im 18/377  lung]
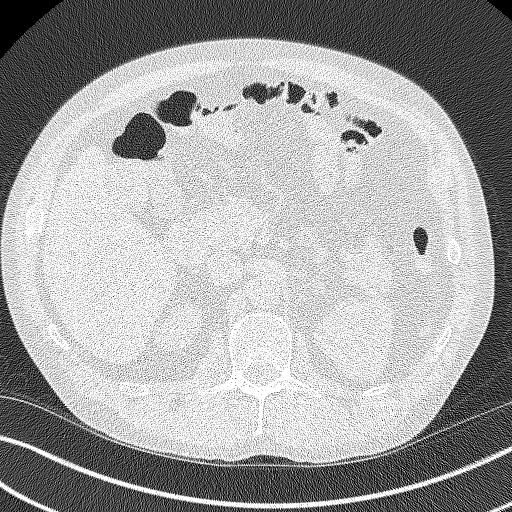
[im 54/377  lung]
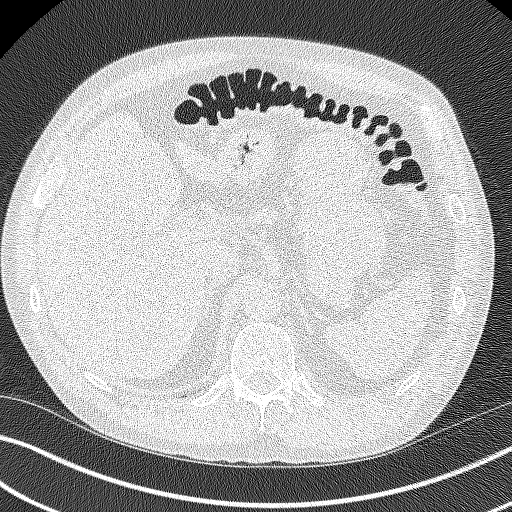
[im 90/377  lung]
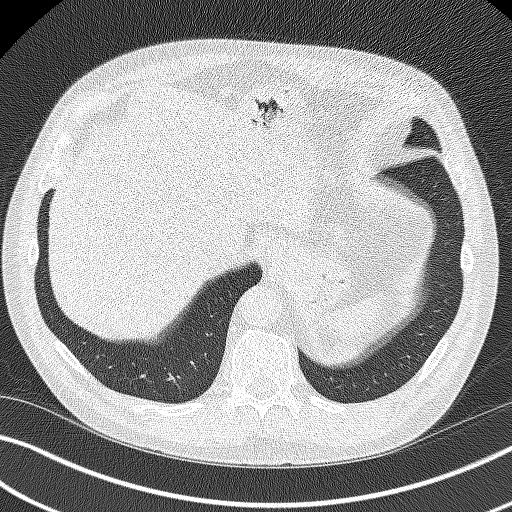
[im 108/377  lung]
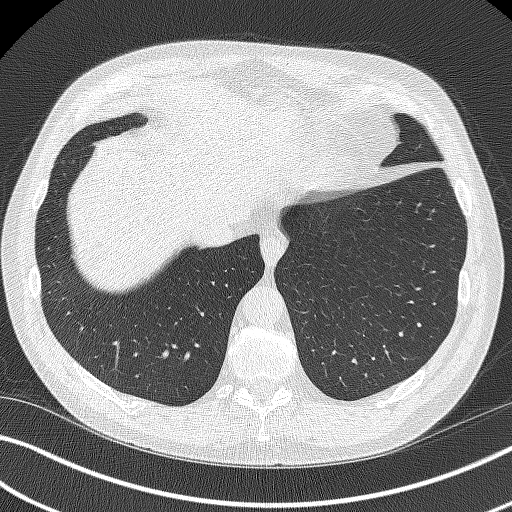
[im 144/377  mediastinal]
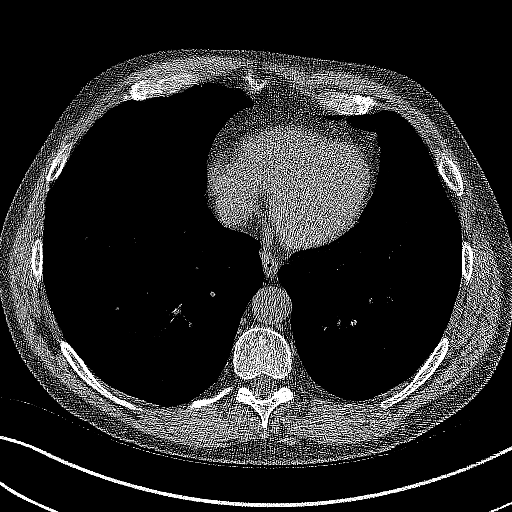
[im 144/377  lung]
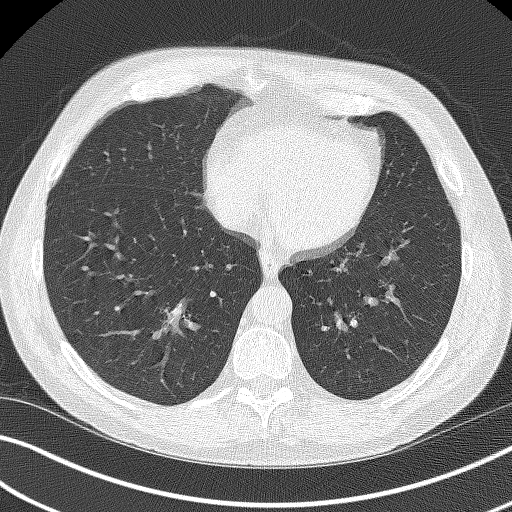
[im 180/377  lung]
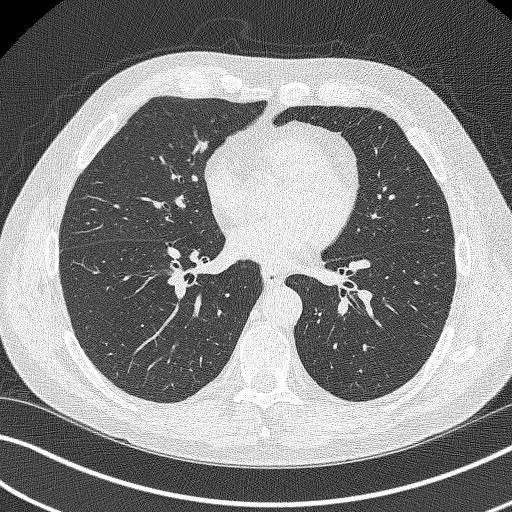
[im 197/377  lung]
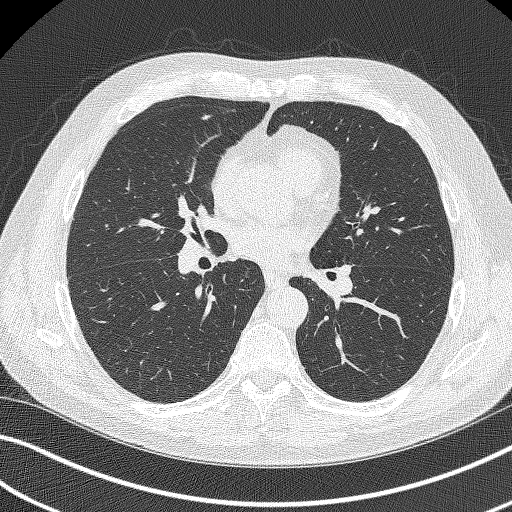
[im 233/377  lung]
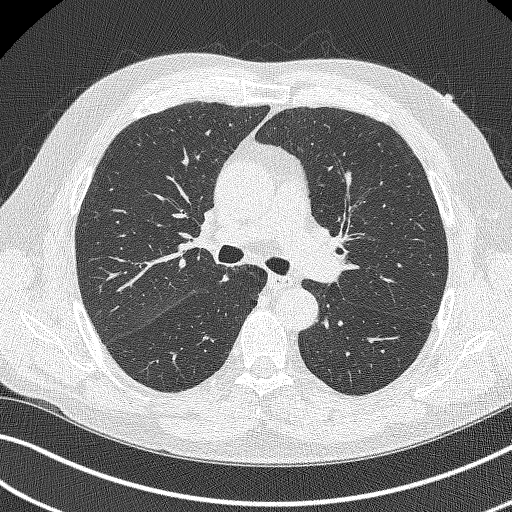
[im 269/377  mediastinal]
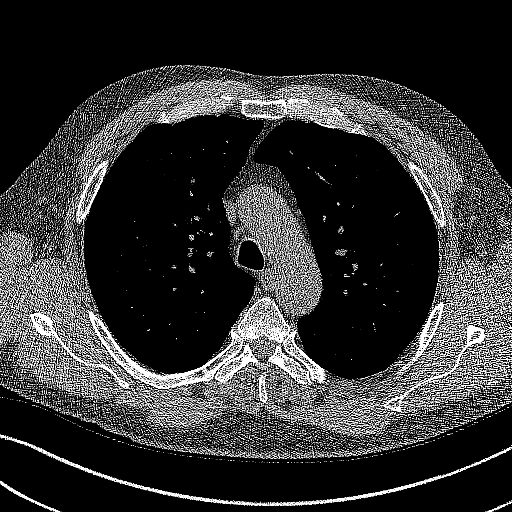
[im 269/377  lung]
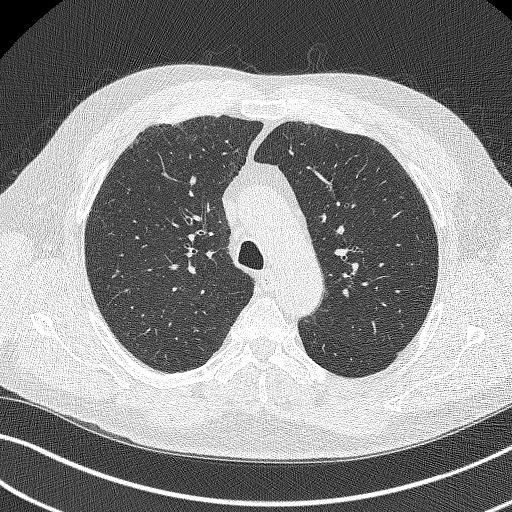
[im 287/377  lung]
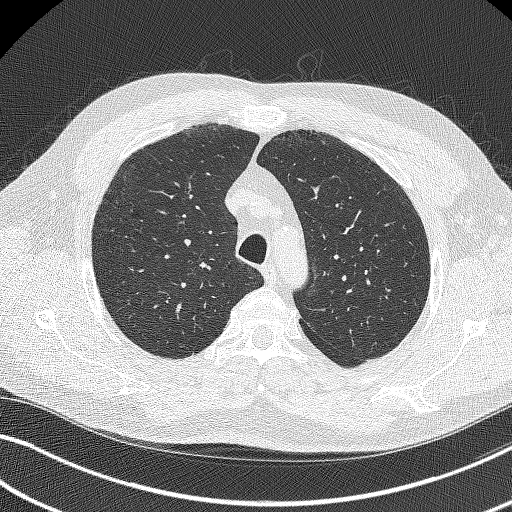
[im 323/377  lung]
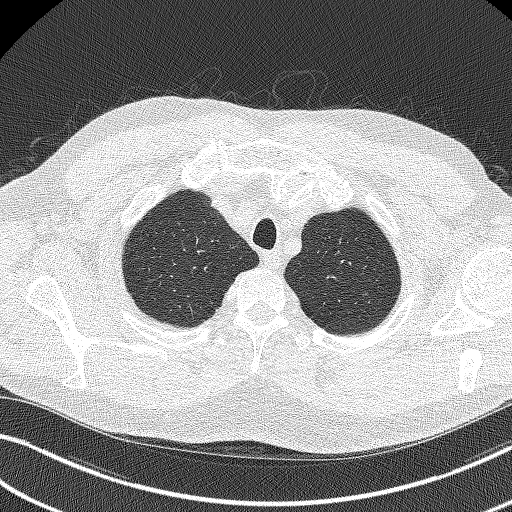
[im 359/377  lung]
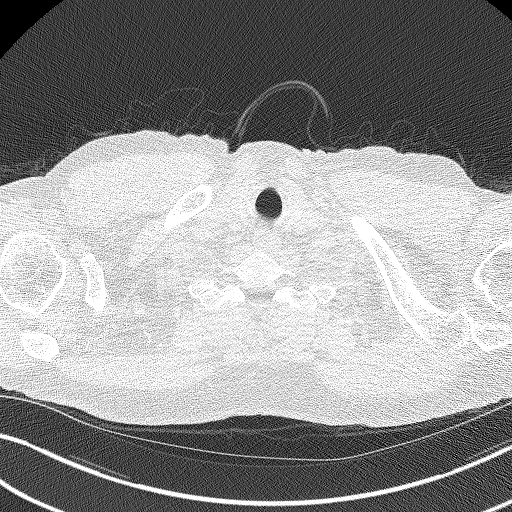

[Series 5: coronals lung 1.00 cor · coronal · 0.70mm/px · 3 of 304 slices shown]
[im 61/304  lung]
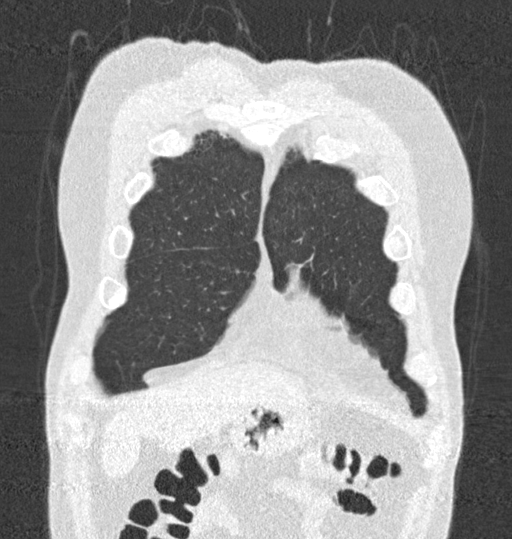
[im 122/304  lung]
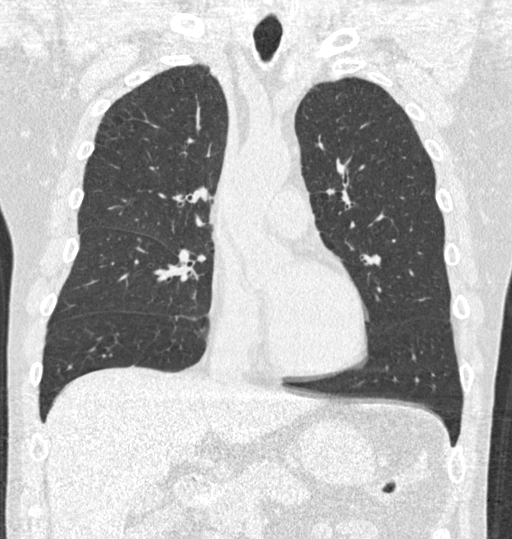
[im 182/304  lung]
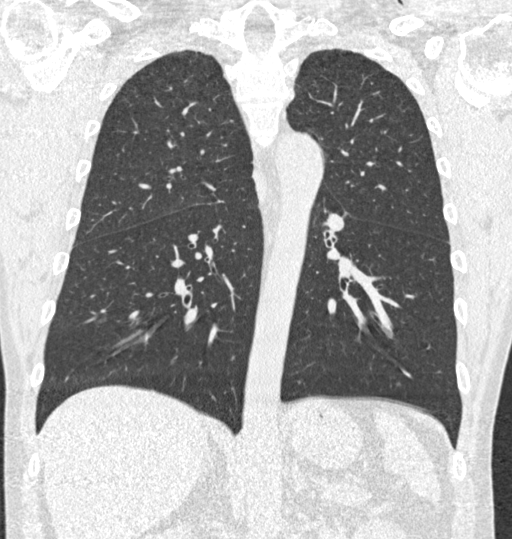

[15 of 40 positions shown; findings below may reference images not displayed]

FINDINGS: Cardiovascular: The heart size is normal. No substantial pericardial
effusion. Mild atherosclerotic calcification is noted in the wall of
the thoracic aorta.

Mediastinum/Nodes: No mediastinal lymphadenopathy. No evidence for
gross hilar lymphadenopathy although assessment is limited by the
lack of intravenous contrast on the current study. The esophagus has
normal imaging features. There is no axillary lymphadenopathy.

Lungs/Pleura: Centrilobular and paraseptal emphysema evident.
Scattered tiny bilateral pulmonary nodules identified previously are
stable in the interval. No new suspicious pulmonary nodule or mass.
No focal airspace consolidation. No pleural effusion.

Upper Abdomen: Unremarkable.

Musculoskeletal: No worrisome lytic or sclerotic osseous
abnormality.
IMPRESSION: 1. Lung-RADS 2, benign appearance or behavior. Continue annual
screening with low-dose chest CT without contrast in 12 months.

Aortic Atherosclerosis ([DR]-[DR]) and Emphysema ([DR]-[DR]).

## 2021-10-25 ENCOUNTER — Other Ambulatory Visit: Payer: Self-pay | Admitting: Acute Care

## 2021-10-25 DIAGNOSIS — Z87891 Personal history of nicotine dependence: Secondary | ICD-10-CM

## 2021-10-25 DIAGNOSIS — F17210 Nicotine dependence, cigarettes, uncomplicated: Secondary | ICD-10-CM

## 2021-12-12 ENCOUNTER — Ambulatory Visit: Payer: Self-pay

## 2021-12-12 ENCOUNTER — Telehealth: Payer: Medicaid Other

## 2021-12-12 DIAGNOSIS — J438 Other emphysema: Secondary | ICD-10-CM

## 2021-12-12 DIAGNOSIS — E782 Mixed hyperlipidemia: Secondary | ICD-10-CM

## 2021-12-12 DIAGNOSIS — F17219 Nicotine dependence, cigarettes, with unspecified nicotine-induced disorders: Secondary | ICD-10-CM

## 2021-12-12 DIAGNOSIS — G894 Chronic pain syndrome: Secondary | ICD-10-CM

## 2021-12-12 DIAGNOSIS — I10 Essential (primary) hypertension: Secondary | ICD-10-CM

## 2021-12-12 DIAGNOSIS — F10288 Alcohol dependence with other alcohol-induced disorder: Secondary | ICD-10-CM

## 2021-12-12 NOTE — Patient Instructions (Signed)
Visit Information  Thank you for taking time to visit with me today. Please don't hesitate to contact me if I can be of assistance to you before our next scheduled telephone appointment.  Following are the goals we discussed today:  RNCM Clinical Goal(s):  Patient will verbalize basic understanding of HTN, HLD, COPD, Tobacco Use, and Alcohol use, and chronic pain  disease process and self health management plan as evidenced by working with the CCM team to optimize health and well being and get needed resources to help with financial burden take all medications exactly as prescribed and will call provider for medication related questions as evidenced by taking medications as prescribed and calling for refills before running out of medications     continue to work with Consulting civil engineer and/or Social Worker to address care management and care coordination needs related to HTN, HLD, COPD, Tobacco Use, and smoker, alcohol use and chronic pain  as evidenced by adherence to CM Team Scheduled appointments     demonstrate a decrease in alcohol abuse  exacerbations  as evidenced by cutting back on alcohol, reaching out to the pcp for assistance with rehab options, and working with the CCM team to effectively manage health and well being demonstrate ongoing self health care management ability for effective management of Chronic Conditions  as evidenced by working with the CCM team through collaboration with Consulting civil engineer, provider, and care team.    Interventions: 1:1 collaboration with primary care provider regarding development and update of comprehensive plan of care as evidenced by provider attestation and co-signature Inter-disciplinary care team collaboration (see longitudinal plan of care) Evaluation of current treatment plan related to  self management and patient's adherence to plan as established by provider     COPD: (Status: New goal. Goal on Track (progressing): YES.) Long Term Goal  Reviewed  medications with patient, including use of prescribed maintenance and rescue inhalers, and provided instruction on medication management and the importance of adherence. 12-12-2021: States compliance with his medications Provided patient with basic written and verbal COPD education on self care/management/and exacerbation prevention Advised patient to track and manage COPD triggers. 12-12-2021: Knows what triggers his COPD. States he is stable. Provided written and verbal instructions on pursed lip breathing and utilized returned demonstration as teach back Provided instruction about proper use of medications used for management of COPD including inhalers Advised patient to self assesses COPD action plan zone and make appointment with provider if in the yellow zone for 48 hours without improvement Advised patient to engage in light exercise as tolerated 3-5 days a week to aid in the the management of COPD Provided education about and advised patient to utilize infection prevention strategies to reduce risk of respiratory infection Discussed the importance of adequate rest and management of fatigue with COPD Screening for signs and symptoms of depression related to chronic disease state  Assessed social determinant of health barriers   Alcohol use   (Status: New goal. Goal on Track (progressing): YES.) Long Term Goal  Evaluation of current treatment plan related to  Alcohol use , Substance abuse issues -  alcohol and smoking  self-management and patient's adherence to plan as established by provider. 12-12-2021: The patient is following pcp and specialist. Had an EGD in December. Recommendations for follow up EGD in 3 years for evaluation of Barrett's esophagus. States he is taking medications prescribed by the specialist.  Discussed plans with patient for ongoing care management follow up and provided patient with direct  contact information for care management team Advised patient to call the office for  changes in condition, wanting to seek help with cessation of alcohol use; Provided education to patient re: benefits of cessation of alcohol; Discussed plans with patient for ongoing care management follow up and provided patient with direct contact information for care management team; Advised patient to discuss treatment options with provider;   Hyperlipidemia:  (Status: New goal. Goal on Track (progressing): YES.) Long Term Goal       Lab Results  Component Value Date    CHOL 157 10/04/2021    HDL 51 10/04/2021    LDLCALC 68 10/04/2021    TRIG 232 (H) 10/04/2021      Medication review performed; medication list updated in electronic medical record.  Provider established cholesterol goals reviewed; Counseled on importance of regular laboratory monitoring as prescribed. 12-12-2021: Has regular lab testing; Provided HLD educational materials; Reviewed role and benefits of statin for ASCVD risk reduction; Discussed strategies to manage statin-induced myalgias; Reviewed importance of limiting foods high in cholesterol. 12-12-2021: Encouraged heart healthy diet   Hypertension: (Status: New goal. Goal on Track (progressing): YES.) Last practice recorded BP readings:     BP Readings from Last 3 Encounters:  10/18/21 (!) 165/99  10/10/21 133/88  10/04/21 114/77  Most recent eGFR/CrCl:       Lab Results  Component Value Date    EGFR 75 10/04/2021    No components found for: CRCL   Evaluation of current treatment plan related to hypertension self management and patient's adherence to plan as established by provider. 12-12-2021: Denies any issues with HTN or heart health. Has a history of fluctuations in blood pressure. Encouraged the patient to keep follow up appointments. Next with pcp on 01-08-2022 at 0840 am;   Provided education to patient re: stroke prevention, s/s of heart attack and stroke; Reviewed prescribed diet heart healthy Reviewed medications with patient and discussed  importance of compliance;  Counseled on adverse effects of illicit drug and excessive alcohol use in patients with high blood pressure;  Discussed plans with patient for ongoing care management follow up and provided patient with direct contact information for care management team; Advised patient, providing education and rationale, to monitor blood pressure daily and record, calling PCP for findings outside established parameters;  Advised patient to discuss blood pressure trends  with provider; Provided education on prescribed diet heart healthy;  Discussed complications of poorly controlled blood pressure such as heart disease, stroke, circulatory complications, vision complications, kidney impairment, sexual dysfunction;    Pain:  (Status: New goal. Goal on Track (progressing): YES.) Long Term Goal  Pain assessment performed. 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 He ask for a referral for ortho specialist. Ask the patient if he has ever been to emerg ortho and he said no. Encouraged him to discuss options with the pcp at upcoming appointment. Will in basket message pcp for request of referral for the patient.  Medications reviewed. 12-12-2021: States the medication he is taking is not beneficial and is not helping his pain.  Reviewed provider established plan for pain management; Discussed importance of adherence to all scheduled medical appointments> 12-12-2021: Reminded the patient to of his upcoming appointment with the pcp on 01-08-2022 at [place] am Counseled on the importance of reporting any/all new or changed pain symptoms or management strategies to pain management provider; Advised patient to  report to care team affect of pain on daily activities; Discussed use of relaxation techniques and/or diversional activities to assist with pain reduction (distraction, imagery, relaxation, massage, acupressure, TENS,  heat, and cold application; Reviewed with patient prescribed pharmacological and nonpharmacological pain relief strategies; Advised patient to discuss uncontrolled pain, changes in level or intensity of pain  with provider;     Smoking Cessation: (Status: Goal on Track (progressing): YES.) Long Term Goal  Reviewed smoking history:  tobacco abuse of 43 years; currently smoking 1 ppd Previous quit attempts, unsuccessful unknown successful using unknown  Reports smoking within 30 minutes of waking up Reports triggers to smoke include: habit, stress reflief Reports motivation to quit smoking includes: knows would benefit health and well being  On a scale of 1-10, reports MOTIVATION to quit is 5 On a scale of 1-10, reports CONFIDENCE in quitting is 5   Evaluation of current treatment plan reviewed; Advised patient to discuss smoking cessation options with provider; Provided contact information for [place] [facility] (1-800-QUIT-NOW); Discussed plans with patient for ongoing care management follow up and provided patient with direct contact information for care management team; Advised patient to discuss smoking cessation options  with provider; 12-12-2021: The patient knows there are options to help with smoking cessation.    Patient Goals/Self-Care Activities: Patient will self administer medications as prescribed as evidenced by self report/primary caregiver report  Patient will attend all scheduled provider appointments as evidenced by clinician review of documented attendance to scheduled appointments and patient/caregiver report Patient will call pharmacy for medication refills as evidenced by patient report and review of pharmacy fill history as appropriate Patient will attend church or other social activities as evidenced by patient report Patient will continue to perform ADL's independently as evidenced by patient/caregiver report Patient will continue to perform IADL's independently as  evidenced by patient/caregiver report Patient will call provider office for new concerns or questions as evidenced by review of documented incoming telephone call notes and patient report Patient will work with BSW to address care coordination needs and will continue to work with the clinical team to address health care and disease management related needs as evidenced by documented adherence to scheduled care management/care coordination appointments - eliminate smoking in my home - identify and remove indoor air pollutants - limit outdoor activity during cold weather - listen for public air quality announcements every day - do breathing exercises every day - develop a rescue plan - eliminate symptom triggers at home - follow rescue plan if symptoms flare-up - use an extra pillow to sleep - develop a new routine to improve sleep - don't eat or exercise right before bedtime - eat healthy/prescribed diet: Heart healthy/ADA diet  - get at least 7 to 8 hours of sleep at night - use devices that will help like a cane, sock-puller or reacher - practice relaxation or meditation daily - do exercises in a comfortable position that makes breathing as easy as possible - check blood pressure daily - choose a place to take my blood pressure (home, clinic or office, retail store) - learn about high blood pressure - keep a blood pressure log - take blood pressure log to all doctor appointments - call doctor for signs and symptoms of high blood pressure - develop an action plan for high blood pressure - keep all doctor appointments - take medications for blood pressure exactly as prescribed - report new symptoms to your doctor - eat more whole grains, fruits and vegetables, lean meats  and healthy fats - call for medicine refill 2 or 3 days before it runs out - take all medications exactly as prescribed - call doctor with any symptoms you believe are related to your medicine - call doctor when you  experience any new symptoms - go to all doctor appointments as scheduled - adhere to prescribed diet: heart healthy diet    Our next appointment is by telephone on 03-05-2022 at 0900 am  Please call the care guide team at [phone] if you need to cancel or reschedule your appointment.   If you are experiencing a Mental Health or [place] or need someone to talk to, please call the Suicide and Crisis Lifeline: 988 call the [place] National Suicide Prevention Lifeline: 310-252-9443 or TTY: (941) 767-8058 TTY 864-704-8869) to talk to a trained counselor call 1-800-273-TALK (toll free, 24 hour hotline)   Patient verbalizes understanding of instructions and care plan provided today and agrees to view in [facility]. Active MyChart status confirmed with patient.    Telephone follow up appointment with care management team member scheduled for: 03-05-2022 at 0900 am  Noreene Larsson RN, MSN, [facility] Mobile: [phone]

## 2021-12-12 NOTE — Chronic Care Management (AMB) (Signed)
Care Management    RN Visit Note  12/12/2021 Name: Luke Briggs. MRN: [id number] DOB: 06-07-1961  Subjective: Luke Briggs. is a 61 y.o. year old male who is a primary care patient of [name], Barbaraann Faster, NP. The care management team was consulted for assistance with disease management and care coordination needs.    Engaged with patient by telephone for follow up visit in response to provider referral for case management and/or care coordination services.   Consent to Services:   Mr. Pracht was given information about Care Management services today including:  Care Management services includes personalized support from designated clinical staff supervised by his physician, including individualized plan of care and coordination with other care providers 24/7 contact phone numbers for assistance for urgent and routine care needs. The patient may stop case management services at any time by phone call to the office staff.  Patient agreed to services and consent obtained.   Assessment: Review of patient past medical history, allergies, medications, health status, including review of consultants reports, laboratory and other test data, was performed as part of comprehensive evaluation and provision of chronic care management services.   SDOH (Social Determinants of Health) assessments and interventions performed:    Care Plan  Allergies  Allergen Reactions   Lisinopril Swelling    Reaction: angioedema   Amlodipine     Dizziness and edema with this   Penicillins Rash    Reaction: Feels like skin is on fire Did it involve swelling of the face/tongue/throat, SOB, or low BP? No Did it involve sudden or severe rash/hives, skin peeling, or any reaction on the inside of your mouth or nose? No Did you need to seek medical attention at a hospital or doctor's office? Yes When did it last happen? 30 years ago If all above answers are NO, may proceed with cephalosporin use.      Outpatient Encounter Medications as of 12/12/2021  Medication Sig   albuterol (VENTOLIN HFA) 108 (90 Base) MCG/ACT inhaler Inhale 2 puffs into the lungs every 6 (six) hours as needed for wheezing orshortness of breath.   atorvastatin (LIPITOR) 10 MG tablet Take 1 tablet (10 mg total) by mouth daily.   EPINEPHrine 0.3 mg/0.3 mL IJ SOAJ injection Inject 0.3 mLs (0.3 mg total) into the muscle as needed for anaphylaxis.   finasteride (PROSCAR) 5 MG tablet Take 1 tablet (5 mg total) by mouth daily.   hydrocortisone cream 0.5 % Apply 1 application topically 2 (two) times daily.   Ipratropium-Albuterol (COMBIVENT) 20-100 MCG/ACT AERS respimat Inhale 1 puff into the lungs every 6 (six) hours. (Patient not taking: Reported on 10/18/2021)   montelukast (SINGULAIR) 10 MG tablet Take 1 tablet (10 mg total) by mouth at bedtime. (Patient not taking: Reported on 10/18/2021)   olmesartan-hydrochlorothiazide (BENICAR HCT) 20-12.5 MG tablet Take 1 tablet by mouth daily.   omeprazole (PRILOSEC) 40 MG capsule Take 1 capsule (40 mg total) by mouth in the morning and at bedtime.   tadalafil (CIALIS) 5 MG tablet Take 1 tablet (5 mg total) by mouth daily as needed for erectile dysfunction.   tamsulosin (FLOMAX) 0.4 MG CAPS capsule Take 1 capsule (0.4 mg total) by mouth daily.   Tiotropium Bromide-Olodaterol (STIOLTO RESPIMAT) 2.5-2.5 MCG/ACT AERS Inhale 2 puffs into the lungs daily.   topiramate (TOPAMAX) 25 MG tablet Start out at 25 MG (one tablet) by mouth daily for one week and then increase to 50 MG (two tablets) by mouth daily. (Patient  not taking: Reported on 10/18/2021)   [DISCONTINUED] carvedilol (COREG) 3.125 MG tablet Take 1 tablet (3.125 mg total) by mouth 2 (two) times daily.   No facility-administered encounter medications on file as of 12/12/2021.    Patient Active Problem List   Diagnosis Date Noted   Acute esophagitis    Gastric erythema    Elevated serum GGT level 07/26/2021   Diarrhea 07/26/2021    Mixed hyperlipidemia 03/27/2021   History of lacunar cerebrovascular accident 02/13/2021   Tinnitus of both ears 02/13/2021   Migraines 02/13/2021   Opioid withdrawal ([place]) 01/10/2021   Carotid stenosis 12/31/2020   Erectile dysfunction 03/22/2020   Aortic atherosclerosis ([place]) 09/29/2019   Bilateral primary osteoarthritis of knee 07/01/2019   Paraseptal emphysema ([place]) 06/17/2019   Controlled substance agreement signed 05/05/2019   Chronic pain syndrome 04/28/2019   BPH (benign prostatic hyperplasia) 04/28/2019   Eczema 04/28/2019   Essential hypertension 04/28/2019   GERD (gastroesophageal reflux disease) 04/28/2019   Schizophrenia ([place]) 04/28/2019   Chronic post-traumatic stress disorder (PTSD) 04/28/2019   Hemorrhoids 04/28/2019   Nicotine dependence, cigarettes, w unsp disorders 04/28/2019   Alcohol dependence ([place]) 04/28/2019    Conditions to be addressed/monitored: HTN, HLD, COPD, Tobacco Use, and Chronic pain and ETOH  Care Plan : RNCM: General Plan of Care (Adult) for Chronic Disease Management and Care Coordination Needs  Updates made by Vanita Ingles, RN since 12/12/2021 12:00 AM     Problem: RNCM: Development of Plan of Care for Chronic Disease Managment (HTN, HLD, Emphysema, Smoker, ETOH, Chronic pain)   Priority: High     Long-Range Goal: RNCM: Development of Plan of Care for Chronic Disease Managment (HTN, HLD, Emphysema, Smoker, ETOH, Chronic pain)   Start Date: 10/04/2021  Expected End Date: 10/04/2022  Priority: High  Note:   Current Barriers:  Knowledge Deficits related to plan of care for management of HTN, HLD, COPD, Smoker, ETOH use, and chronic pain   Care Coordination needs related to Financial constraints related to inability to afford propane gas for heat source during the winter  and Substance abuse issues -  smoker and ETOH use  Chronic Disease Management support and education needs related to HTN, HLD, COPD, and Smoker, Alcohol use, and chronic  pain  Lacks caregiver support.        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 12-12-2021: The patient has received assistance from the care guides for help with oil bill  RNCM Clinical Goal(s):  Patient will verbalize basic understanding of HTN, HLD, COPD, Tobacco Use, and Alcohol use, and chronic pain  disease process and self health management plan as evidenced by working with the CCM team to optimize health and well being and get needed resources to help with financial burden take all medications exactly as prescribed and will call provider for medication related questions as evidenced by taking medications as prescribed and calling for refills before running out of medications     continue to work with Consulting civil engineer and/or Social Worker to address care management and care coordination needs related to HTN, HLD, COPD, Tobacco Use, and smoker, alcohol use and chronic pain  as evidenced by adherence to CM Team Scheduled appointments     demonstrate a decrease in alcohol abuse  exacerbations  as evidenced by cutting back on alcohol, reaching out to the pcp  for assistance with rehab options, and working with the CCM team to effectively manage health and well being demonstrate ongoing self health care management ability for effective management of Chronic Conditions  as evidenced by working with the CCM team through collaboration with Consulting civil engineer, provider, and care team.   Interventions: 1:1 collaboration with primary care provider regarding development and update of comprehensive plan of care as evidenced by provider attestation and co-signature Inter-disciplinary care team collaboration (see longitudinal plan of care) Evaluation of current treatment plan related to  self management and patient's adherence to plan as established by  provider   COPD: (Status: New goal. Goal on Track (progressing): YES.) Long Term Goal  Reviewed medications with patient, including use of prescribed maintenance and rescue inhalers, and provided instruction on medication management and the importance of adherence. 12-12-2021: States compliance with his medications Provided patient with basic written and verbal COPD education on self care/management/and exacerbation prevention Advised patient to track and manage COPD triggers. 12-12-2021: Knows what triggers his COPD. States he is stable. Provided written and verbal instructions on pursed lip breathing and utilized returned demonstration as teach back Provided instruction about proper use of medications used for management of COPD including inhalers Advised patient to self assesses COPD action plan zone and make appointment with provider if in the yellow zone for 48 hours without improvement Advised patient to engage in light exercise as tolerated 3-5 days a week to aid in the the management of COPD Provided education about and advised patient to utilize infection prevention strategies to reduce risk of respiratory infection Discussed the importance of adequate rest and management of fatigue with COPD Screening for signs and symptoms of depression related to chronic disease state  Assessed social determinant of health barriers  Alcohol use   (Status: New goal. Goal on Track (progressing): YES.) Long Term Goal  Evaluation of current treatment plan related to  Alcohol use , Substance abuse issues -  alcohol and smoking  self-management and patient's adherence to plan as established by provider. 12-12-2021: The patient is following pcp and specialist. Had an EGD in December. Recommendations for follow up EGD in 3 years for evaluation of Barrett's esophagus. States he is taking medications prescribed by the specialist.  Discussed plans with patient for ongoing care management follow up and provided  patient with direct contact information for care management team Advised patient to call the office for changes in condition, wanting to seek help with cessation of alcohol use; Provided education to patient re: benefits of cessation of alcohol; Discussed plans with patient for ongoing care management follow up and provided patient with direct contact information for care management team; Advised patient to discuss treatment options with provider;  Hyperlipidemia:  (Status: New goal. Goal on Track (progressing): YES.) Long Term Goal  Lab Results  Component Value Date   CHOL 157 10/04/2021   HDL 51 10/04/2021   LDLCALC 68 10/04/2021   TRIG 232 (H) 10/04/2021     Medication review performed; medication list updated in electronic medical record.  Provider established cholesterol goals reviewed; Counseled on importance of regular laboratory monitoring as prescribed. 12-12-2021: Has regular lab testing; Provided HLD educational materials; Reviewed role and benefits of statin for ASCVD risk reduction; Discussed strategies to manage statin-induced myalgias; Reviewed importance of limiting foods high in cholesterol. 12-12-2021: Encouraged heart healthy diet  Hypertension: (Status: New goal. Goal on Track (progressing): YES.) Last practice recorded BP readings:  BP Readings from Last 3 Encounters:  10/18/21 [name])  165/99  10/10/21 133/88  10/04/21 114/77  Most recent eGFR/CrCl:  Lab Results  Component Value Date   EGFR 75 10/04/2021    No components found for: CRCL  Evaluation of current treatment plan related to hypertension self management and patient's adherence to plan as established by provider. 12-12-2021: Denies any issues with HTN or heart health. Has a history of fluctuations in blood pressure. Encouraged the patient to keep follow up appointments. Next with pcp on 01-08-2022 at 0840 am;   Provided education to patient re: stroke prevention, s/s of heart attack and stroke; Reviewed  prescribed diet heart healthy Reviewed medications with patient and discussed importance of compliance;  Counseled on adverse effects of illicit drug and excessive alcohol use in patients with high blood pressure;  Discussed plans with patient for ongoing care management follow up and provided patient with direct contact information for care management team; Advised patient, providing education and rationale, to monitor blood pressure daily and record, calling PCP for findings outside established parameters;  Advised patient to discuss blood pressure trends  with provider; Provided education on prescribed diet heart healthy;  Discussed complications of poorly controlled blood pressure such as heart disease, stroke, circulatory complications, vision complications, kidney impairment, sexual dysfunction;   Pain:  (Status: New goal. Goal on Track (progressing): YES.) Long Term Goal  Pain assessment performed. 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 He ask for a referral for ortho specialist. Ask the patient if he has ever been to emerg ortho and he said no. Encouraged him to discuss options with the pcp at upcoming appointment. Will in basket message pcp for request of referral for the patient.  Medications reviewed. 12-12-2021: States the medication he is taking is not beneficial and is not helping his pain.  Reviewed provider established plan for pain management; Discussed importance of adherence to all scheduled medical appointments> 12-12-2021: Reminded the patient to of his upcoming appointment with the pcp on 01-08-2022 at [place] am Counseled on the importance of reporting any/all new or changed pain symptoms or management strategies to pain management provider; Advised patient to report to care team affect of pain on daily activities; Discussed use of relaxation techniques and/or diversional activities to assist with pain  reduction (distraction, imagery, relaxation, massage, acupressure, TENS, heat, and cold application; Reviewed with patient prescribed pharmacological and nonpharmacological pain relief strategies; Advised patient to discuss uncontrolled pain, changes in level or intensity of pain  with provider;   Smoking Cessation: (Status: Goal on Track (progressing): YES.) Long Term Goal  Reviewed smoking history:  tobacco abuse of 43 years; currently smoking 1 ppd Previous quit attempts, unsuccessful unknown successful using unknown  Reports smoking within 30 minutes of waking up Reports triggers to smoke include: habit, stress reflief Reports motivation to quit smoking includes: knows would benefit health and well being  On a scale of 1-10, reports MOTIVATION to quit is 5 On a scale of 1-10, reports CONFIDENCE in quitting is 5  Evaluation of current treatment plan reviewed; Advised patient to discuss smoking cessation options with provider; Provided contact information for [place] [facility]uit [facility] (1-800-QUIT-NOW); Discussed plans with patient for ongoing care management follow up and provided patient with direct contact information for care management team; Advised patient to discuss smoking cessation options  with provider; 12-12-2021: The patient knows there are options to help with smoking cessation.   Patient Goals/Self-Care Activities: Patient will  self administer medications as prescribed as evidenced by self report/primary caregiver report  Patient will attend all scheduled provider appointments as evidenced by clinician review of documented attendance to scheduled appointments and patient/caregiver report Patient will call pharmacy for medication refills as evidenced by patient report and review of pharmacy fill history as appropriate Patient will attend church or other social activities as evidenced by patient report Patient will continue to perform ADL's independently as evidenced by patient/caregiver  report Patient will continue to perform IADL's independently as evidenced by patient/caregiver report Patient will call provider office for new concerns or questions as evidenced by review of documented incoming telephone call notes and patient report Patient will work with BSW to address care coordination needs and will continue to work with the clinical team to address health care and disease management related needs as evidenced by documented adherence to scheduled care management/care coordination appointments - eliminate smoking in my home - identify and remove indoor air pollutants - limit outdoor activity during cold weather - listen for public air quality announcements every day - do breathing exercises every day - develop a rescue plan - eliminate symptom triggers at home - follow rescue plan if symptoms flare-up - use an extra pillow to sleep - develop a new routine to improve sleep - don't eat or exercise right before bedtime - eat healthy/prescribed diet: Heart healthy/ADA diet  - get at least 7 to 8 hours of sleep at night - use devices that will help like a cane, sock-puller or reacher - practice relaxation or meditation daily - do exercises in a comfortable position that makes breathing as easy as possible - check blood pressure daily - choose a place to take my blood pressure (home, clinic or office, retail store) - learn about high blood pressure - keep a blood pressure log - take blood pressure log to all doctor appointments - call doctor for signs and symptoms of high blood pressure - develop an action plan for high blood pressure - keep all doctor appointments - take medications for blood pressure exactly as prescribed - report new symptoms to your doctor - eat more whole grains, fruits and vegetables, lean meats and healthy fats - call for medicine refill 2 or 3 days before it runs out - take all medications exactly as prescribed - call doctor with any symptoms  you believe are related to your medicine - call doctor when you experience any new symptoms - go to all doctor appointments as scheduled - adhere to prescribed diet: heart healthy diet       Plan: Telephone follow up appointment with care management team member scheduled for:  03-05-2022 at 0900 am  Noreene Larsson RN, MSN, [facility] Mobile: [phone]

## 2021-12-17 ENCOUNTER — Ambulatory Visit: Payer: Self-pay

## 2021-12-17 NOTE — Telephone Encounter (Signed)
° ° °  Chief Complaint: Lower abdominal pain Symptoms: Pain in rectum Frequency: Started 2 days ago Pertinent Negatives:  Disposition: [] ED /[] Urgent Care (no appt availability in office) / [x] Appointment(In office/virtual)/ []  [facility]/ [] Home Care/ [] Refused Recommended Disposition /[] [facility]/ []  Follow-up with PCP Additional Notes: Instructed to go to ED for worsening of symptoms.   Reason for Disposition  [1] MILD-MODERATE pain AND [2] constant AND [3] present > 2 hours  Answer Assessment - Initial Assessment Questions 1. LOCATION: "Where does it hurt?"      Below belly button 2. RADIATION: "Does the pain shoot anywhere else?" (e.g., chest, back)     Rectum 3. ONSET: "When did the pain begin?" (Minutes, hours or days ago)      2 days ago 4. SUDDEN: "Gradual or sudden onset?"     Gradual 5. PATTERN "Does the pain come and go, or is it constant?"    - If constant: "Is it getting better, staying the same, or worsening?"      (Note: Constant means the pain never goes away completely; most serious pain is constant and it progresses)     - If intermittent: "How long does it last?" "Do you have pain now?"     (Note: Intermittent means the pain goes away completely between bouts)     Comes and goes 6. SEVERITY: "How bad is the pain?"  (e.g., Scale 1-10; mild, moderate, or severe)    - MILD (1-3): doesn't interfere with normal activities, abdomen soft and not tender to touch     - MODERATE (4-7): interferes with normal activities or awakens from sleep, abdomen tender to touch     - SEVERE (8-10): excruciating pain, doubled over, unable to do any normal activities       10 7. RECURRENT SYMPTOM: "Have you ever had this type of stomach pain before?" If Yes, ask: "When was the last time?" and "What happened that time?"      No 8. CAUSE: "What do you think is causing the stomach pain?"     Unsure 9. RELIEVING/AGGRAVATING FACTORS: "What makes it better or worse?"  (e.g., movement, antacids, bowel movement)     No 10. OTHER SYMPTOMS: "Do you have any other symptoms?" (e.g., back pain, diarrhea, fever, urination pain, vomiting)       No  Protocols used: Abdominal Pain - Male-A-AH

## 2021-12-18 ENCOUNTER — Ambulatory Visit
Admission: RE | Admit: 2021-12-18 | Discharge: 2021-12-18 | Disposition: A | Discharge status: Home / Self Care | Payer: Medicaid Other | Attending: Family Medicine | Admitting: Family Medicine

## 2021-12-18 ENCOUNTER — Ambulatory Visit (INDEPENDENT_AMBULATORY_CARE_PROVIDER_SITE_OTHER): Payer: Medicaid Other | Admitting: Family Medicine

## 2021-12-18 ENCOUNTER — Ambulatory Visit
Admission: RE | Admit: 2021-12-18 | Discharge: 2021-12-18 | Disposition: A | Discharge status: Home / Self Care | Payer: Medicaid Other | Source: Ambulatory Visit | Attending: Family Medicine | Admitting: Family Medicine

## 2021-12-18 ENCOUNTER — Other Ambulatory Visit: Payer: Self-pay

## 2021-12-18 ENCOUNTER — Encounter: Payer: Self-pay | Admitting: Family Medicine

## 2021-12-18 VITALS — BP 124/85 | HR 73 | Temp 97.9°F | Wt 202.6 lb

## 2021-12-18 DIAGNOSIS — G8929 Other chronic pain: Secondary | ICD-10-CM | POA: Insufficient documentation

## 2021-12-18 DIAGNOSIS — M542 Cervicalgia: Secondary | ICD-10-CM | POA: Diagnosis not present

## 2021-12-18 DIAGNOSIS — R1030 Lower abdominal pain, unspecified: Secondary | ICD-10-CM

## 2021-12-18 DIAGNOSIS — M4802 Spinal stenosis, cervical region: Secondary | ICD-10-CM | POA: Insufficient documentation

## 2021-12-18 LAB — URINALYSIS, ROUTINE W REFLEX MICROSCOPIC
Bilirubin, UA: NEGATIVE
Glucose, UA: NEGATIVE
Ketones, UA: NEGATIVE
Leukocytes,UA: NEGATIVE
Nitrite, UA: NEGATIVE
Protein,UA: NEGATIVE
RBC, UA: NEGATIVE
Specific Gravity, UA: 1.02 (ref 1.005–1.030)
Urobilinogen, Ur: 0.2 mg/dL (ref 0.2–1.0)
pH, UA: 6 (ref 5.0–7.5)

## 2021-12-18 IMAGING — DX DG CERVICAL SPINE COMPLETE 4+V
6 series · 6 of 6 positions shown · non-contrast
Comparison: None.

CLINICAL DATA: Chronic neck pain.

EXAM:
CERVICAL SPINE - COMPLETE 4+ VIEW

[c-spine lat]
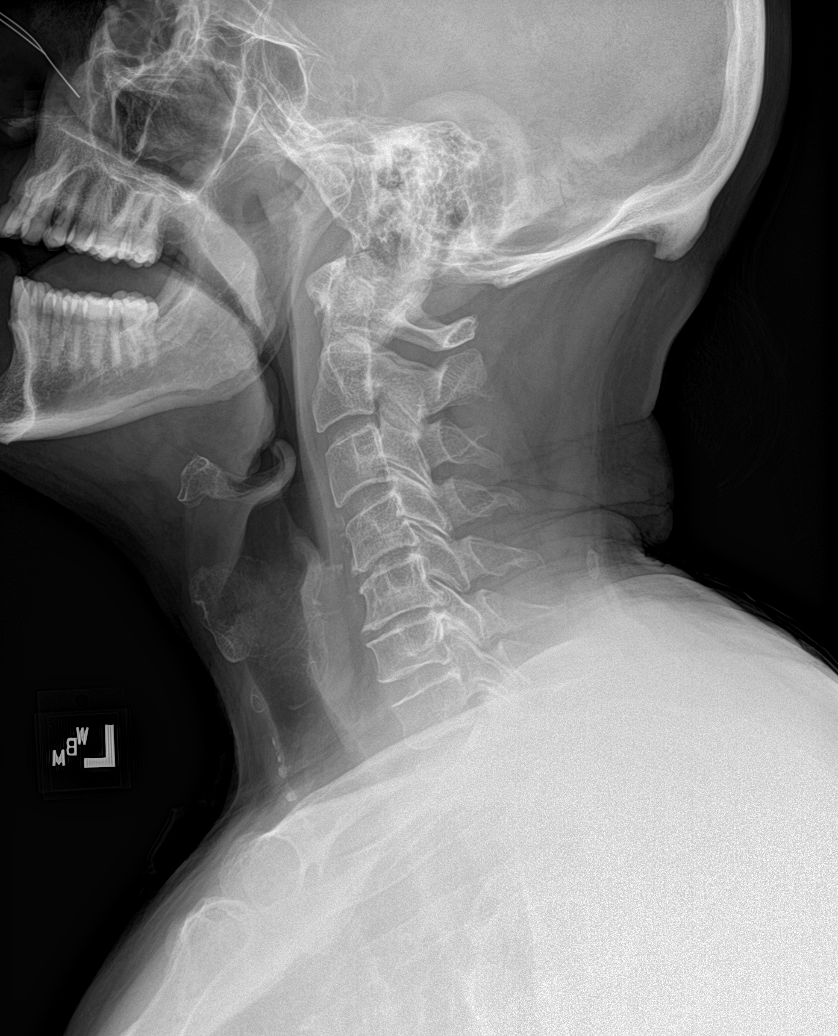

[c-spine obl (1 of 2)]
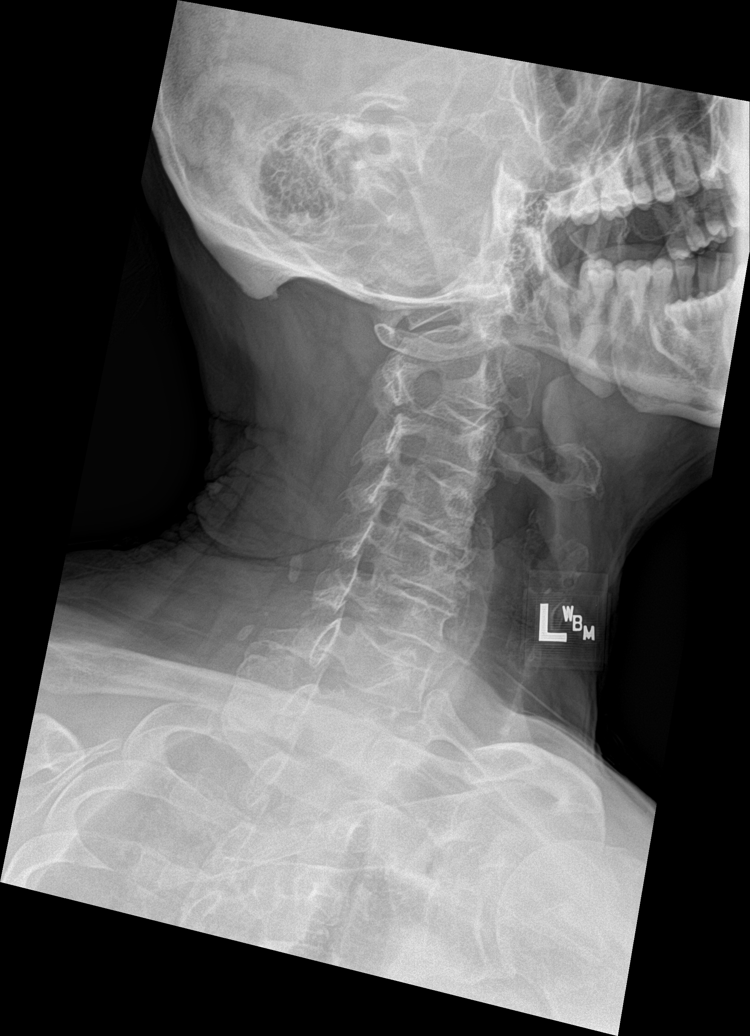

[c-spine obl (2 of 2)]
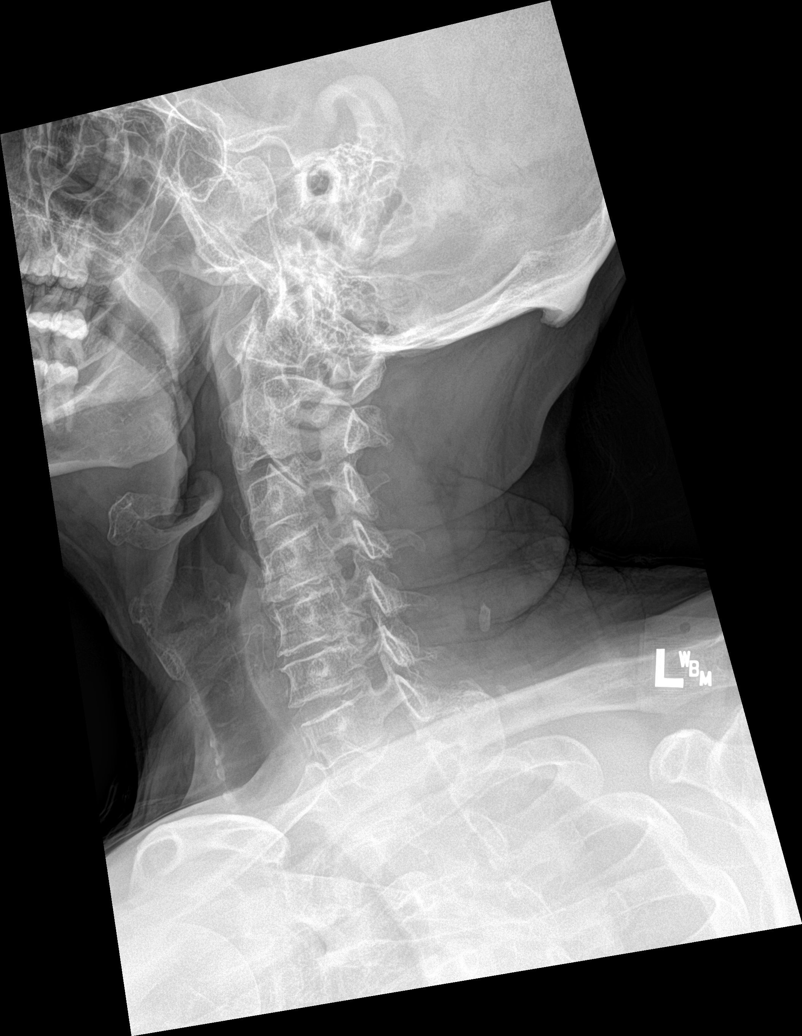

[c-spine ap]
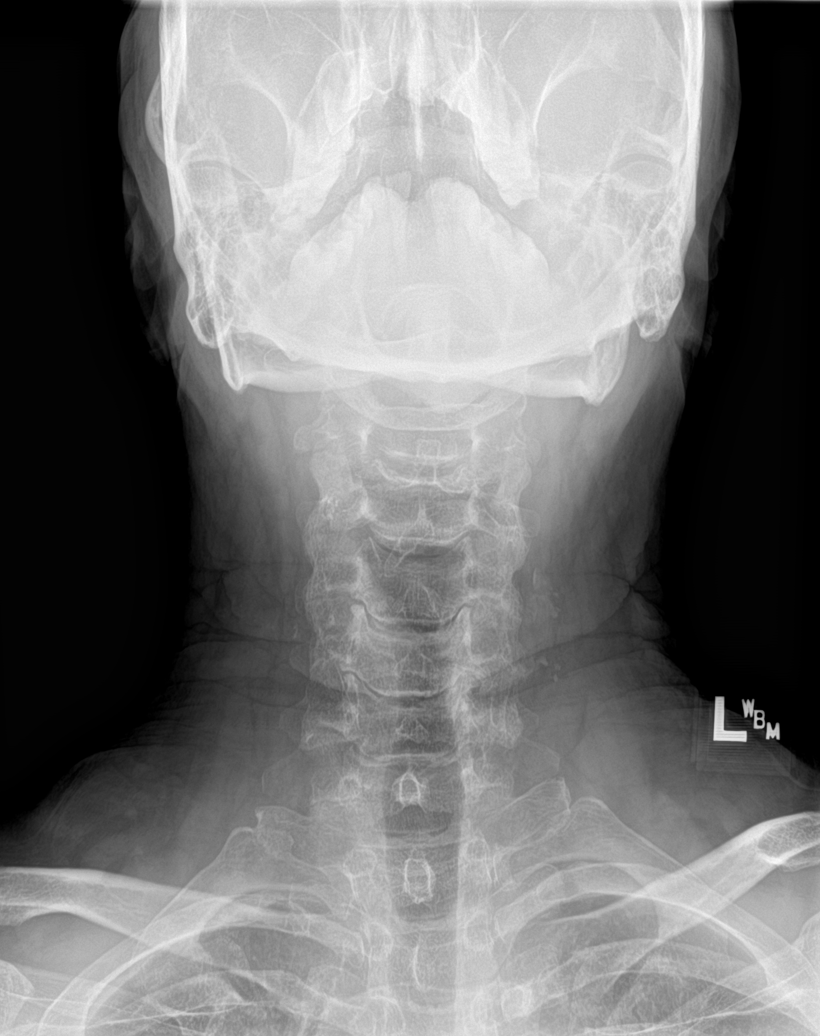

[c-spine open mouth]
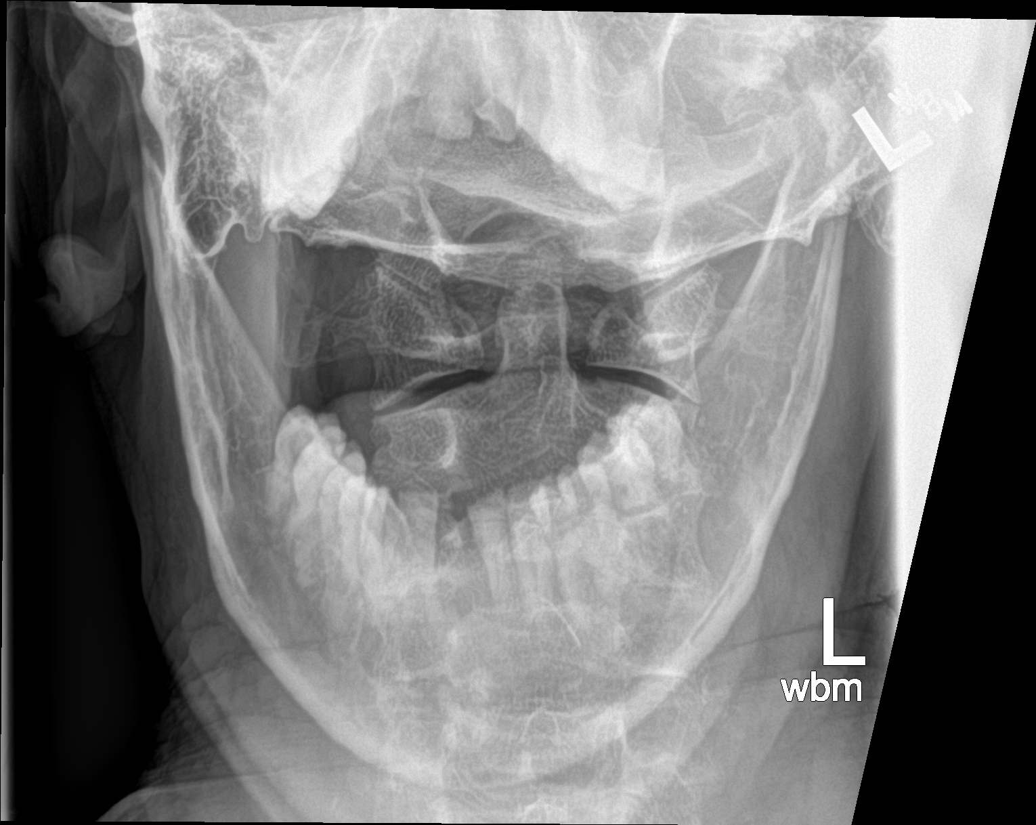

[c-spine swimmers]
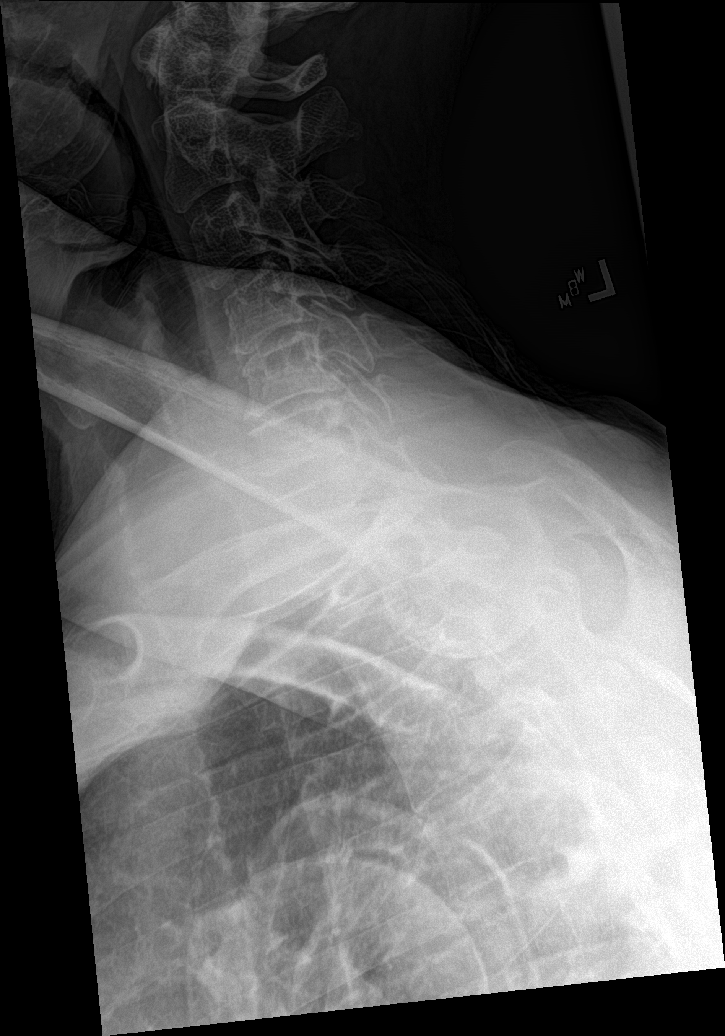

[6 of 6 positions shown; findings below may reference images not displayed]

FINDINGS: Straightening of normal lordosis. No listhesis. Disc space narrowing
and endplate spurring at C4-C5 and C5-C6. There is occasional facet
hypertrophy. Suspected bony neural foraminal narrowing at C5-C6 on
the left. No evidence of fracture, focal bone lesion or bony
destruction. No prevertebral soft tissue thickening.
IMPRESSION: 1. Degenerative disc disease at C4-C5 and C5-C6. Mild facet
hypertrophy.
2. Suspected bony neural foraminal narrowing at C5-C6 on the left.

## 2021-12-18 MED ORDER — CIPROFLOXACIN HCL 500 MG PO TABS: 500.0000 mg | ORAL_TABLET | Freq: Two times a day (BID) | ORAL | 0 refills | Status: DC

## 2021-12-18 NOTE — Assessment & Plan Note (Signed)
Was seeing pain management but states that he weaned himself off. Notes that he has never had imaging on his neck, but did know that he had DJD. Will start with x-ray and referral to ortho. Await results.

## 2021-12-18 NOTE — Progress Notes (Signed)
BP 124/85    Pulse 73    Temp 97.9 F (36.6 C)    Wt 202 lb 9.6 oz (91.9 kg)    SpO2 98%    BMI 29.07 kg/m    Subjective:    Patient ID: Luke Pollen., male    DOB: Apr 25, 1961, 61 y.o.   MRN: [id number]  HPI: Luke Deasis. is a 61 y.o. male  Chief Complaint  Patient presents with   Abdominal Pain    Patient states he has been having pain in his groin area. Patient states he feels pain in his anal cavity, pain feels like a pulse. Patient states it is painful when he tries to have a BM.    ABDOMINAL PAIN  Duration:3-4 days Onset: sudden Severity: severe Quality: aching and deep Location:  lower abdomen, near his anus, deep inside  Episode duration: constant Radiation: into his groin and anus Frequency: constant Alleviating factors: nothing Aggravating factors: BM, movement Status: worse Treatments attempted: none Fever: no Nausea: no Vomiting: no Weight loss: no Decreased appetite: no Diarrhea: yes Constipation: no Blood in stool: no Heartburn: no Jaundice: no Rash: no Dysuria/urinary frequency: no Hematuria: no History of sexually transmitted disease: no Recurrent NSAID use: no  NECK PAIN  Diagnosis: cervical DJD Status: stable Treatments attempted: chronic pain medication  Compliant with recommended treatment: has weaned himself off Relief with NSAIDs?:  No NSAIDs Taken Location:neck down to mid shoulders Duration:chronic Severity: severe Quality: aching and sore Frequency: constant Radiation: headache Aggravating factors: moving Alleviating factors: pain medicine Weakness:  no Paresthesias / decreased sensation:  no  Fevers:  no  Relevant past medical, surgical, family and social history reviewed and updated as indicated. Interim medical history since our last visit reviewed. Allergies and medications reviewed and updated.  Review of Systems  Constitutional: Negative.   HENT: Negative.    Gastrointestinal:  Positive for abdominal  pain and diarrhea. Negative for abdominal distention, anal bleeding, blood in stool, constipation, nausea, rectal pain and vomiting.  Musculoskeletal: Negative.   Skin: Negative.   Neurological: Negative.   Psychiatric/Behavioral: Negative.     Per HPI unless specifically indicated above     Objective:    BP 124/85    Pulse 73    Temp 97.9 F (36.6 C)    Wt 202 lb 9.6 oz (91.9 kg)    SpO2 98%    BMI 29.07 kg/m   Wt Readings from Last 3 Encounters:  12/18/21 202 lb 9.6 oz (91.9 kg)  10/23/21 200 lb (90.7 kg)  10/18/21 200 lb (90.7 kg)    Physical Exam Vitals and nursing note reviewed.  Constitutional:      General: He is not in acute distress.    Appearance: Normal appearance. He is not ill-appearing, toxic-appearing or diaphoretic.  HENT:     Head: Normocephalic and atraumatic.     Right Ear: External ear normal.     Left Ear: External ear normal.     Nose: Nose normal.     Mouth/Throat:     Mouth: Mucous membranes are moist.     Pharynx: Oropharynx is clear.  Eyes:     General: No scleral icterus.       Right eye: No discharge.        Left eye: No discharge.     Extraocular Movements: Extraocular movements intact.     Conjunctiva/sclera: Conjunctivae normal.     Pupils: Pupils are equal, round, and reactive to light.  Cardiovascular:  Rate and Rhythm: Normal rate and regular rhythm.     Pulses: Normal pulses.     Heart sounds: Normal heart sounds. No murmur heard.   No friction rub. No gallop.  Pulmonary:     Effort: Pulmonary effort is normal. No respiratory distress.     Breath sounds: Normal breath sounds. No stridor. No wheezing, rhonchi or rales.  Chest:     Chest wall: No tenderness.  Abdominal:     General: Abdomen is flat. Bowel sounds are normal. There is no distension.     Palpations: There is no mass.     Tenderness: There is abdominal tenderness (diffuse). There is no right CVA tenderness, left CVA tenderness, guarding or rebound.     Hernia: No  hernia is present.  Musculoskeletal:        General: Normal range of motion.     Cervical back: Normal range of motion and neck supple.  Skin:    General: Skin is warm and dry.     Capillary Refill: Capillary refill takes less than 2 seconds.     Coloration: Skin is not jaundiced or pale.     Findings: No bruising, erythema, lesion or rash.  Neurological:     General: No focal deficit present.     Mental Status: He is alert and oriented to person, place, and time. Mental status is at baseline.  Psychiatric:        Mood and Affect: Mood normal.        Behavior: Behavior normal.        Thought Content: Thought content normal.        Judgment: Judgment normal.    Results for orders placed or performed in visit on 12/18/21  Urinalysis, Routine w reflex microscopic  Result Value Ref Range   Specific Gravity, UA 1.020 1.005 - 1.030   pH, UA 6.0 5.0 - 7.5   Color, UA Yellow Yellow   Appearance Ur Clear Clear   Leukocytes,UA Negative Negative   Protein,UA Negative Negative/Trace   Glucose, UA Negative Negative   Ketones, UA Negative Negative   RBC, UA Negative Negative   Bilirubin, UA Negative Negative   Urobilinogen, Ur 0.2 0.2 - 1.0 mg/dL   Nitrite, UA Negative Negative      Assessment & Plan:   Problem List Items Addressed This Visit       Other   Chronic neck pain    Was seeing pain management but states that he weaned himself off. Notes that he has never had imaging on his neck, but did know that he had DJD. Will start with x-ray and referral to ortho. Await results.       Relevant Orders   DG Cervical Spine Complete   Ambulatory referral to Orthopedic Surgery   Other Visit Diagnoses     Lower abdominal pain    -  Primary   Concern for prostatitis. Offered CT vs emperic treatment- would like to try abx first. Cipro BID sent through. If not getting better in 2 days, needs CT abdomen   Relevant Orders   Urinalysis, Routine w reflex microscopic (Completed)         Follow up plan: Return in about 4 weeks (around 01/15/2022), or with PCP.

## 2021-12-20 ENCOUNTER — Telehealth: Payer: Self-pay | Admitting: Family Medicine

## 2021-12-20 NOTE — Telephone Encounter (Signed)
Attempted to contact patient, no answer LVM.

## 2021-12-20 NOTE — Telephone Encounter (Signed)
Can we please reach out to him and see if his pain has improved?

## 2021-12-20 NOTE — Telephone Encounter (Signed)
-----   Message from Valerie Roys, [place] sent at 12/18/2021 12:07 PM EST ----- Check on abdominal pain- if not better needs CT to r/o diverticulitis

## 2021-12-21 ENCOUNTER — Other Ambulatory Visit: Payer: Self-pay | Admitting: Urology

## 2021-12-21 ENCOUNTER — Other Ambulatory Visit: Payer: Self-pay | Admitting: Family Medicine

## 2021-12-21 DIAGNOSIS — M542 Cervicalgia: Secondary | ICD-10-CM

## 2021-12-21 DIAGNOSIS — G8929 Other chronic pain: Secondary | ICD-10-CM

## 2021-12-21 DIAGNOSIS — M4722 Other spondylosis with radiculopathy, cervical region: Secondary | ICD-10-CM

## 2021-12-21 NOTE — Telephone Encounter (Signed)
Second attempt to call, no answer LVM

## 2021-12-24 ENCOUNTER — Ambulatory Visit: Payer: Self-pay

## 2021-12-24 NOTE — Telephone Encounter (Signed)
°  Chief Complaint: medication for MRI Symptoms: anxiety r/t claustrophobia when doing MRI Frequency: pt has appt on 01/04/22 Pertinent Negatives: NA Disposition: [] ED /[] Urgent Care (no appt availability in office) / [] Appointment(In office/virtual)/ []  [facility]/ [] Home Care/ [] Refused Recommended Disposition /[] [facility]/ [x]  Follow-up with PCP Additional Notes:   Summary: advice - medication question    Pt requested a call back to see if it was possible to have something sent in for his upcoming MRI on 2/17, pt stated he is worried about feeling claustrophobic during the procedure. Pt was not sure what he could take for this and needed advice.       Reason for Disposition  Prescription request for new medicine (not a refill)  Answer Assessment - Initial Assessment Questions 2. QUESTION: "What is your question?" (e.g., double dose of medicine, side effect)     Pt needs something for anxiety to take before MRI 3. PRESCRIBING HCP: "Who prescribed it?" Reason: if prescribed by specialist, call should be referred to that group.     Dr Wynetta Emery placed orders for MRI 4. SYMPTOMS: "Do you have any symptoms?"     Pt feels claustrophobic and has been unable to do MRI in past  Protocols used: Medication Question Call-A-AH

## 2021-12-25 MED ORDER — LORAZEPAM 1 MG PO TABS: ORAL_TABLET | ORAL | 1 refills | Status: DC

## 2021-12-25 NOTE — Addendum Note (Signed)
Addended by: Marnee Guarneri T on: 12/25/2021 12:17 PM   Modules accepted: Orders

## 2021-12-25 NOTE — Telephone Encounter (Signed)
Routing to provider to advise.  

## 2022-01-02 ENCOUNTER — Ambulatory Visit: Payer: Medicaid Other

## 2022-01-04 ENCOUNTER — Ambulatory Visit
Admission: RE | Admit: 2022-01-04 | Discharge: 2022-01-04 | Disposition: A | Discharge status: Home / Self Care | Payer: Medicaid Other | Source: Ambulatory Visit | Attending: Family Medicine | Admitting: Family Medicine

## 2022-01-04 ENCOUNTER — Other Ambulatory Visit: Payer: Self-pay | Admitting: Family Medicine

## 2022-01-04 ENCOUNTER — Other Ambulatory Visit: Payer: Self-pay

## 2022-01-04 DIAGNOSIS — M542 Cervicalgia: Secondary | ICD-10-CM | POA: Diagnosis present

## 2022-01-04 DIAGNOSIS — M4722 Other spondylosis with radiculopathy, cervical region: Secondary | ICD-10-CM | POA: Diagnosis not present

## 2022-01-04 DIAGNOSIS — G8929 Other chronic pain: Secondary | ICD-10-CM | POA: Diagnosis present

## 2022-01-04 IMAGING — MR MR CERVICAL SPINE W/O CM
5 series · 37 of 48 positions shown · non-contrast
Comparison: Cervical spine radiographs [DATE]

CLINICAL DATA: Chronic neck pain and limited range of motion with
headaches, worsening over the last 8-12 months

EXAM:
MRI CERVICAL SPINE WITHOUT CONTRAST
TECHNIQUE: Multiplanar, multisequence MR imaging of the cervical spine was
performed. No intravenous contrast was administered.

[Series 5: T2 · sagittal · 3.0mm · 0.62mm/px · 7 of 15 slices shown (1 of 2)]
[im 1/15]
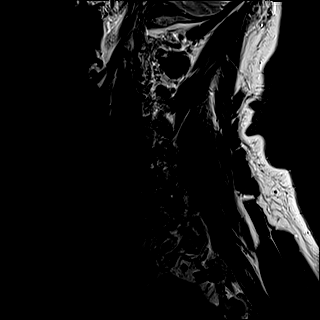
[im 3/15]
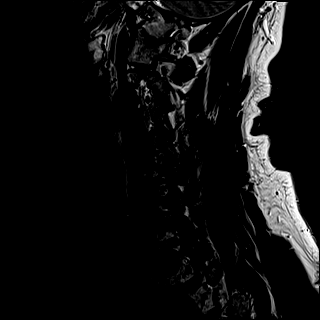
[im 5/15]
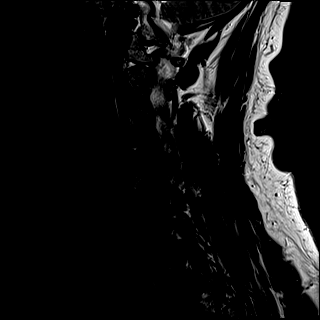
[im 8/15]
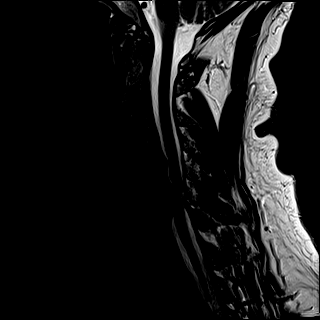
[im 10/15]
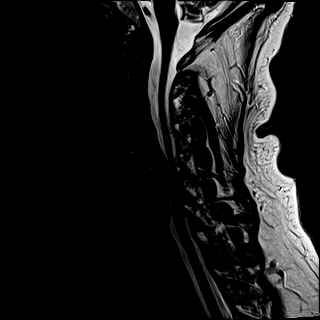
[im 12/15]
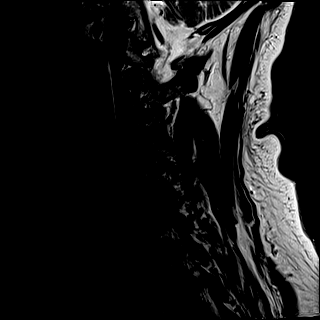
[im 15/15]
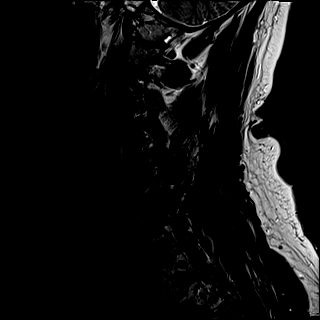

[Series 6: FLAIR · sagittal · 3.0mm · 0.78mm/px · 7 of 15 slices shown]
[im 1/15]
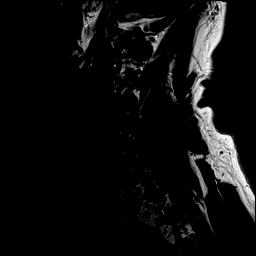
[im 3/15]
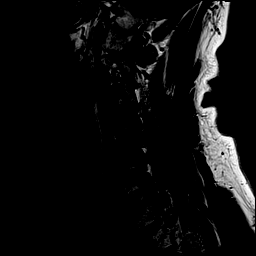
[im 5/15]
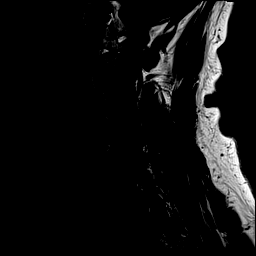
[im 8/15]
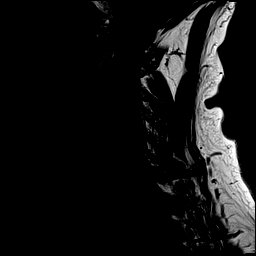
[im 10/15]
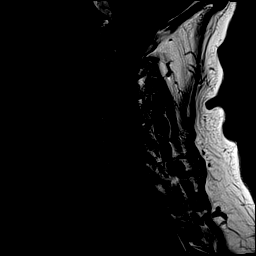
[im 12/15]
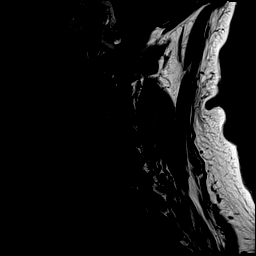
[im 15/15]
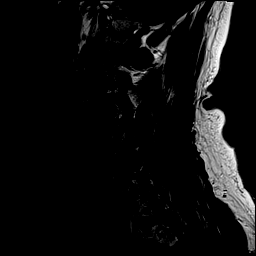

[Series 7: STIR · sagittal · 3.0mm · 0.62mm/px · 7 of 15 slices shown]
[im 1/15]
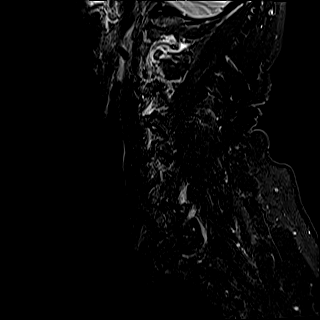
[im 3/15]
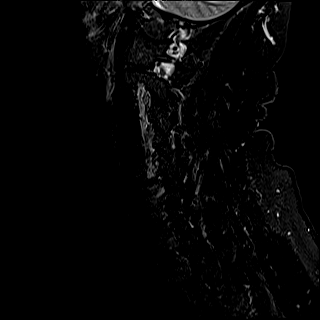
[im 5/15]
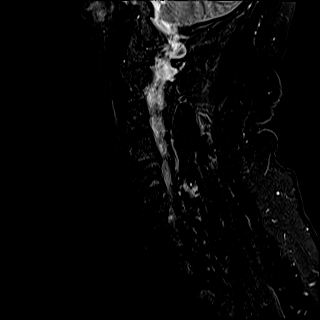
[im 8/15]
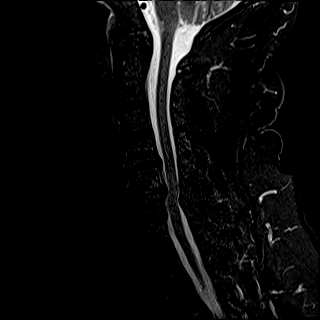
[im 10/15]
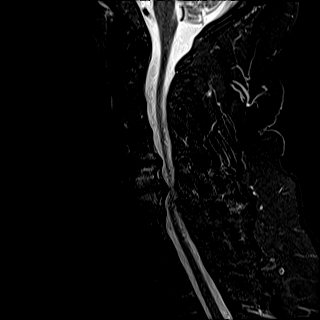
[im 12/15]
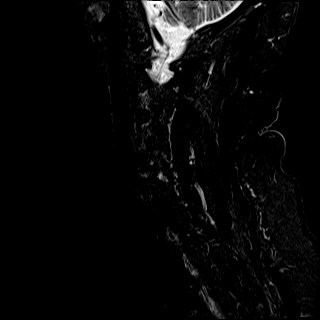
[im 15/15]
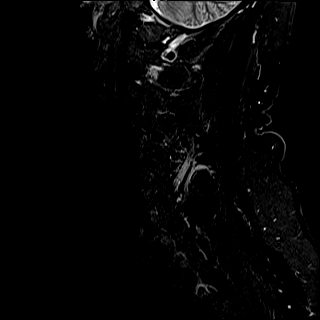

[Series 8: T2 · axial · 3.0mm · 0.70mm/px · z∈[-119,-15]mm · 9 of 29 slices shown (2 of 2)]
[im 1/29]
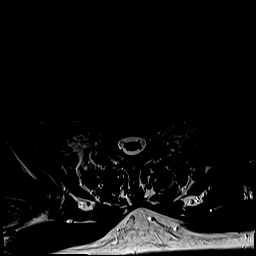
[im 5/29]
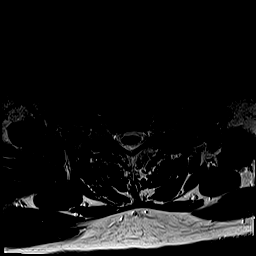
[im 10/29]
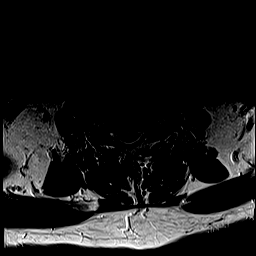
[im 12/29]
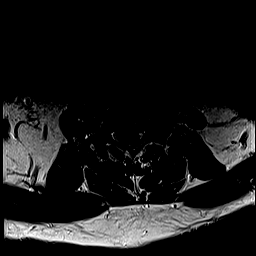
[im 15/29]
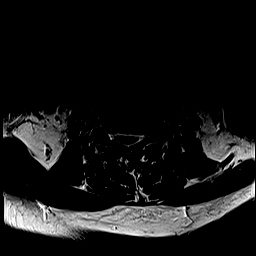
[im 17/29]
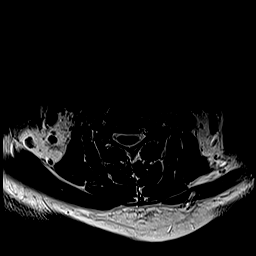
[im 19/29]
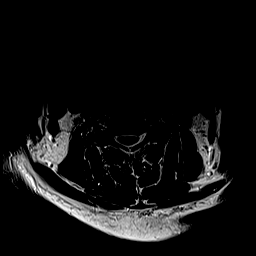
[im 24/29]
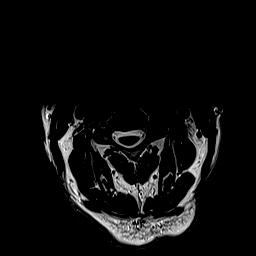
[im 29/29]
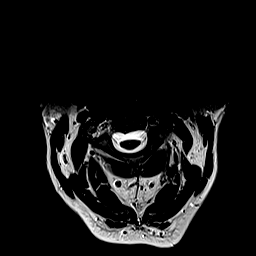

[Series 9: ax mpgr · axial · 3.0mm · 0.35mm/px · z∈[-119,-32]mm · 7 of 31 slices shown]
[im 1/31]
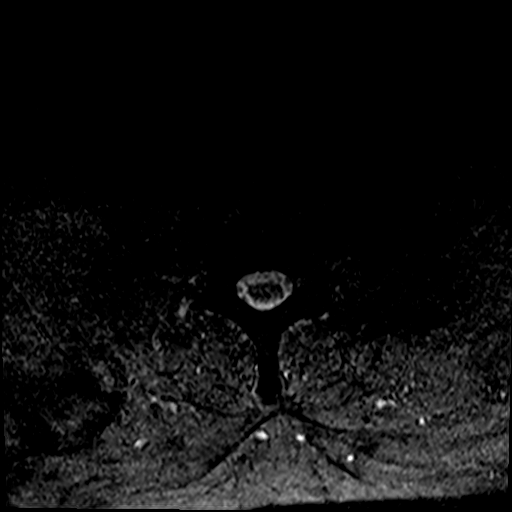
[im 5/31]
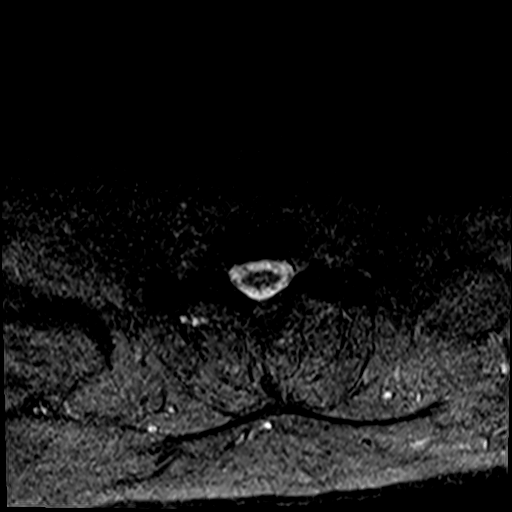
[im 10/31]
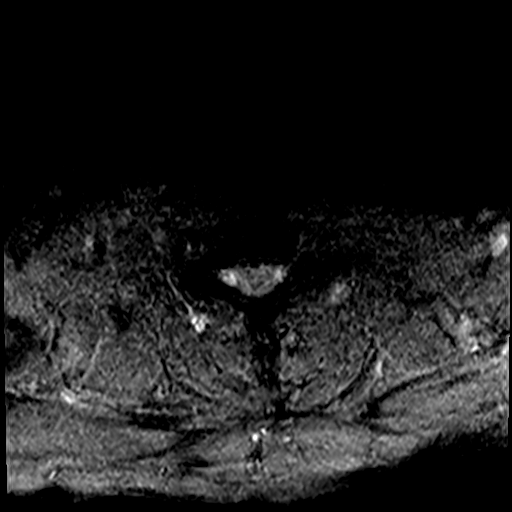
[im 14/31]
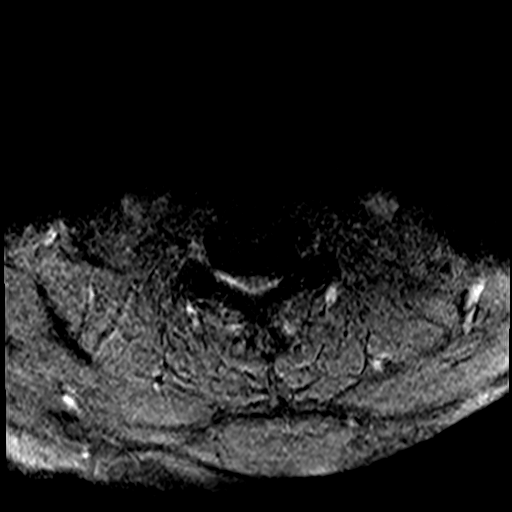
[im 17/31]
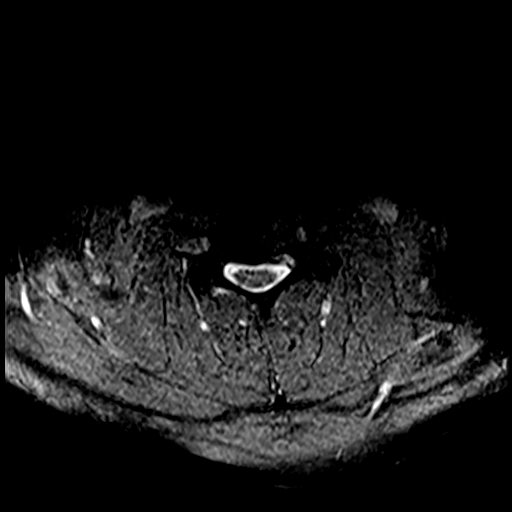
[im 21/31]
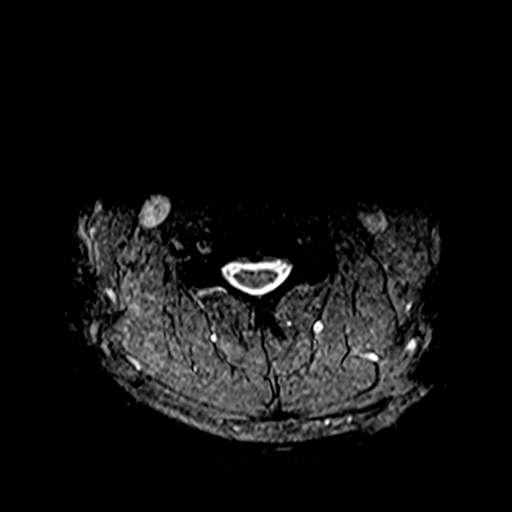
[im 26/31]
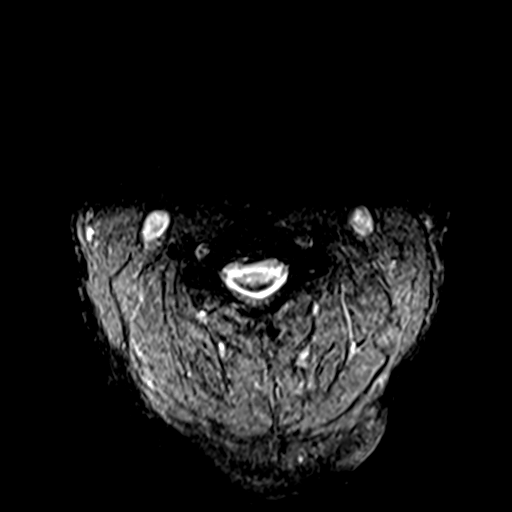

[37 of 48 positions shown; findings below may reference images not displayed]

FINDINGS: Alignment: There is straightening of the normal cervical spine
lordosis with slight reversal centered at C5-C6. There is trace
retrolisthesis of C5 on C6. Alignment is otherwise normal.

Vertebrae: Vertebral body heights are preserved. There is mild
degenerative endplate marrow signal abnormality with edema at C5-C6.
There is no suspicious marrow signal abnormality. There is no other
marrow edema.

Cord: Normal in signal and morphology.

Posterior Fossa, vertebral arteries, paraspinal tissues: The imaged
posterior fossa is unremarkable. The left vertebral artery flow void
is normal. The right vertebral artery flow void is intermittently
absent, in keeping with the findings on the prior MRA neck from
[DATE]. The paraspinal soft tissues are unremarkable.

Disc levels:

There is disc desiccation and narrowing at C4-C5 and C5-C6. The
other disc heights are overall preserved.

C2-C3: There is a small right paracentral disc protrusion without
significant spinal canal or neural foraminal stenosis

C3-C4: There is mild uncovertebral and facet arthropathy resulting
in moderate left and no significant right neural foraminal stenosis
and no significant spinal canal stenosis.

C4-C5: There is a posterior disc osteophyte complex with mild
uncovertebral ridging bilaterally and bilateral facet arthropathy
resulting in severe right and moderate left neural foraminal
stenosis and mild spinal canal stenosis

C5-C6: There is a prominent posterior disc osteophyte complex
eccentric to the left with left worse than right uncovertebral
ridging, and bilateral facet arthropathy resulting in severe left
and moderate right neural foraminal stenosis and moderate spinal
canal stenosis with kinking of the posterior aspect of the cord but
no cord signal abnormality.

C6-C7: There is uncovertebral arthropathy with left worse than right
ridging and bilateral facet arthropathy resulting in severe left and
moderate right neural foraminal stenosis and mild spinal canal
stenosis.

C7-T1: No significant spinal canal or neural foraminal stenosis.
IMPRESSION: 1. At C4-C5, there is a posterior disc osteophyte complex with
bilateral uncovertebral ridging and facet arthropathy resulting in
severe right and moderate left neural foraminal stenosis and mild
spinal canal stenosis.
2. At C5-C6, there is grade 1 retrolisthesis, a prominent posterior
disc osteophyte complex eccentric to the left with left worse than
right uncovertebral ridging, and bilateral facet arthropathy
resulting in severe left and moderate right neural foraminal
stenosis and moderate spinal canal stenosis with mass effect on the
cord but no cord signal abnormality.
3. Severe left and moderate right neural foraminal stenosis at
C6-C7.

## 2022-01-08 ENCOUNTER — Ambulatory Visit: Payer: Medicaid Other | Admitting: Nurse Practitioner

## 2022-01-13 NOTE — Patient Instructions (Signed)

## 2022-01-14 ENCOUNTER — Encounter: Payer: Self-pay | Admitting: Nurse Practitioner

## 2022-01-14 ENCOUNTER — Other Ambulatory Visit: Payer: Self-pay

## 2022-01-14 ENCOUNTER — Ambulatory Visit (INDEPENDENT_AMBULATORY_CARE_PROVIDER_SITE_OTHER): Payer: Medicaid Other | Admitting: Nurse Practitioner

## 2022-01-14 VITALS — BP 119/71 | HR 89 | Temp 98.0°F | Wt 198.6 lb

## 2022-01-14 DIAGNOSIS — R351 Nocturia: Secondary | ICD-10-CM

## 2022-01-14 DIAGNOSIS — J438 Other emphysema: Secondary | ICD-10-CM

## 2022-01-14 DIAGNOSIS — I700 Atherosclerosis of aorta: Secondary | ICD-10-CM | POA: Diagnosis not present

## 2022-01-14 DIAGNOSIS — K219 Gastro-esophageal reflux disease without esophagitis: Secondary | ICD-10-CM

## 2022-01-14 DIAGNOSIS — E782 Mixed hyperlipidemia: Secondary | ICD-10-CM

## 2022-01-14 DIAGNOSIS — I6523 Occlusion and stenosis of bilateral carotid arteries: Secondary | ICD-10-CM

## 2022-01-14 DIAGNOSIS — Z8673 Personal history of transient ischemic attack (TIA), and cerebral infarction without residual deficits: Secondary | ICD-10-CM

## 2022-01-14 DIAGNOSIS — G894 Chronic pain syndrome: Secondary | ICD-10-CM

## 2022-01-14 DIAGNOSIS — F10288 Alcohol dependence with other alcohol-induced disorder: Secondary | ICD-10-CM | POA: Diagnosis not present

## 2022-01-14 DIAGNOSIS — F209 Schizophrenia, unspecified: Secondary | ICD-10-CM

## 2022-01-14 DIAGNOSIS — F17219 Nicotine dependence, cigarettes, with unspecified nicotine-induced disorders: Secondary | ICD-10-CM

## 2022-01-14 DIAGNOSIS — F4312 Post-traumatic stress disorder, chronic: Secondary | ICD-10-CM

## 2022-01-14 DIAGNOSIS — I10 Essential (primary) hypertension: Secondary | ICD-10-CM

## 2022-01-14 DIAGNOSIS — N401 Enlarged prostate with lower urinary tract symptoms: Secondary | ICD-10-CM

## 2022-01-14 DIAGNOSIS — R748 Abnormal levels of other serum enzymes: Secondary | ICD-10-CM

## 2022-01-14 DIAGNOSIS — E559 Vitamin D deficiency, unspecified: Secondary | ICD-10-CM

## 2022-01-14 DIAGNOSIS — H9313 Tinnitus, bilateral: Secondary | ICD-10-CM

## 2022-01-14 DIAGNOSIS — R7301 Impaired fasting glucose: Secondary | ICD-10-CM

## 2022-01-14 DIAGNOSIS — M4802 Spinal stenosis, cervical region: Secondary | ICD-10-CM

## 2022-01-14 DIAGNOSIS — R42 Dizziness and giddiness: Secondary | ICD-10-CM

## 2022-01-14 LAB — BAYER DCA HB A1C WAIVED: HB A1C (BAYER DCA - WAIVED): 5.2 % (ref 4.8–5.6)

## 2022-01-14 MED ORDER — TADALAFIL 5 MG PO TABS: ORAL_TABLET | ORAL | 4 refills | Status: DC

## 2022-01-14 MED ORDER — ATORVASTATIN CALCIUM 10 MG PO TABS: 10.0000 mg | ORAL_TABLET | Freq: Every day | ORAL | 4 refills | Status: DC

## 2022-01-14 MED ORDER — FINASTERIDE 5 MG PO TABS: 5.0000 mg | ORAL_TABLET | Freq: Every day | ORAL | 4 refills | Status: DC

## 2022-01-14 MED ORDER — OMEPRAZOLE 40 MG PO CPDR: 40.0000 mg | DELAYED_RELEASE_CAPSULE | Freq: Two times a day (BID) | ORAL | 4 refills | Status: DC

## 2022-01-14 NOTE — Assessment & Plan Note (Signed)
Recheck CMP, CBC, GTT today -- may pursue ultrasound to further assess area, recent CT was negative for acute findings. 

## 2022-01-14 NOTE — Assessment & Plan Note (Signed)
History of infarct, continue collaboration with neuro and ENT for ongoing tinnitus, headaches, and dizziness.  Repeat MRI brain to further assess.

## 2022-01-14 NOTE — Assessment & Plan Note (Signed)
Chronic, ongoing.  Continue Omeprazole daily and adjust as needed.  Repeat CMP, GGT, CBC today. Recent Hep labs negative. ?related to alcohol intake, have recommended cutting back.

## 2022-01-14 NOTE — Assessment & Plan Note (Signed)
Chronic, ongoing.  Continue current inhaler regimen at this time and recommend complete cessation of smoking.  Continue yearly lung screening -- recent done in December 2022.  Consider pulmonary referral in future.  Spirometry next visit.

## 2022-01-14 NOTE — Assessment & Plan Note (Signed)
Continue collaboration with vascular on yearly basis, to return 02/01/22.

## 2022-01-14 NOTE — Progress Notes (Signed)
BP 119/71    Pulse 89    Temp 98 F (36.7 C) (Oral)    Wt 198 lb 9.6 oz (90.1 kg)    SpO2 98%    BMI 28.50 kg/m    Subjective:    Patient ID: Luke Pollen., male    DOB: 10/07/1961, 61 y.o.   MRN: [id number]  HPI: Luke Brenton. is a 61 y.o. male  Chief Complaint  Patient presents with   Hypertension   COPD   Gastroesophageal Reflux   Alcohol Problem    Patient states when he does not drink he feels deathly sick and when he drinks he feels better. Patient says he has the same symptoms from previous visit.    Hyperlipidemia   Mood   He is scheduled to see neurosurgery 01/18/22 == for OA of neck.  COPD Uses Stiolto and Albuterol as needed.  Had last lung screening on 10/10/20 -- noting emphysema and aortic atherosclerosis. Satisfied with current treatment?: yes Oxygen use: no Dyspnea frequency: minimal Cough frequency: none Rescue inhaler frequency:   Limitation of activity: no Productive cough: none Last Spirometry: unknown Pneumovax: Not up to Date Influenza: Up to Date  BPH Continues on Finasteride and Flomax. BPH status: stable Satisfied with current treatment?: yes Medication side effects: no Medication compliance: good compliance Duration: chronic Nocturia: no Urinary frequency:no Incomplete voiding: no Urgency: no Weak urinary stream: no Straining to start stream: no Dysuria: no Onset: gradual Severity: mild Alleviating factors: medications Aggravating factors: unknown Treatments attempted: as above  GERD Taking Omeprazole 40 MG BID -- just takes this as needed for constipation.  Denies any recent blood in stool.    GGT on labs continues to be above range 88 to 68.  Did have colonoscopy 12/14/20 with Dr. Vicente Males that showed polyps and he is to have repeat in 3 years. He last GI on 10/10/21.  Continues to drink  daily, has cut back to 8-10 [employer] beer during the week and then 8-12 on weekends.  Not drinking every day. Reports drinking  helps his tinnitus and reports he feels worse if he does not drink -- gets weak and fatigued if does not drink.    Continues to smoke 1 PPD, has been smoking for 45 years.  Not interested in quitting.   GERD control status: stable Satisfied with current treatment? yes Heartburn frequency:  Medication side effects: no  Medication compliance: fluctuating Previous GERD medications: TUMS Dysphagia: no Odynophagia:  no Hematemesis: no Blood in stool: no EGD: yes  [place] Office Visit from 01/14/2022 in [place]  AUDIT-C Score [place] Office Visit from 01/14/2022 in [place]  Alcohol Use Disorder Identification Test Final Score (AUDIT) 16       HYPERTENSION/HLD Saw cardiology last on 08/28/21 at [facility] (Dr. Smith Mince).  Last visit on 07/17/21.  Taking Benicar HCT and Atorvastatin.  Has had myoview in past and this showed no ischemia on stress test.     Has chronic right basal ganglia lacunar infarct on imaging and continues to complain of ongoing tinnitus and intermittent dizziness -- sees ENT & neuro for this and last saw Dr. Melrose Nakayama 03/08/21.  He reports wishes to have his brain scan done again as continues to have tinnitus and have dizziness.   Vascular performed duplex ultrasound which showed widely patent RICA and 71% LICA stenosis + chronic right basal ganglia lacunar infarct.   Last echo performed  11/18/19 -- EF 60-65% and no left ventricular hypertrophy.   Hypertension status: stable  Satisfied with current treatment? yes Duration of hypertension: chronic BP monitoring frequency:  daily BP range: 120-130/70 BP medication side effects:  no Medication compliance: good compliance Previous BP meds:has tried multiple medications and had ADR  Aspirin: no Recurrent headaches: yes -- take Tylenol  Visual changes: no Palpitations: no Dyspnea: no Chest pain: no Lower extremity edema: no Dizzy/lightheaded: no  The 10-year ASCVD risk  score (Arnett DK, et al., 2019) is: 9.8%   Values used to calculate the score:     Age: 28 years     Sex: Male     Is Non-Hispanic African American: No     Diabetic: No     Tobacco smoker: Yes     Systolic Blood Pressure: 076 mmHg     Is BP treated: Yes     HDL Cholesterol: 51 mg/dL     Total Cholesterol: 157 mg/dL  SCHIZOPHRENIA No current medications, does not wish to take any as makes him tired.  His depression comes from not able to work and be productive as he once was.   Mood status: controlled Satisfied with current treatment?: yes Symptom severity: moderate  Psychotherapy/counseling: none Previous psychiatric medications: many medications Depressed mood: yes Anxious mood: yes Anhedonia: no Significant weight loss or gain: no Insomnia: none Fatigue: yes Feelings of worthlessness or guilt: yes Impaired concentration/indecisiveness: no Suicidal ideations: no Hopelessness: no Crying spells: no Depression screen [facility] 2/9 01/14/2022 12/18/2021 01/10/2021 07/01/2019 04/28/2019  Decreased Interest 2 3 3  0 3  Down, Depressed, Hopeless 2 2 3  0 3  PHQ - 2 Score 4 5 6  0 6  Altered sleeping 2 2 3  - 3  Tired, decreased energy 1 3 3  - 3  Change in appetite 2 3 3  - 0  Feeling bad or failure about yourself  2 3 1  - 3  Trouble concentrating 2 3 0 - 3  Moving slowly or fidgety/restless 2 1 3  - 0  Suicidal thoughts 0 0 0 - 3  PHQ-9 Score 15 20 19  - 21  Difficult doing work/chores Somewhat difficult - - - Extremely dIfficult     Relevant past medical, surgical, family and social history reviewed and updated as indicated. Interim medical history since our last visit reviewed. Allergies and medications reviewed and updated.  Review of Systems  Constitutional:  Positive for fatigue. Negative for activity change, chills, diaphoresis and fever.  Respiratory:  Negative for cough, chest tightness, shortness of breath and wheezing.   Cardiovascular:  Negative for chest pain, palpitations and  leg swelling.  Gastrointestinal: Negative.   Endocrine: Negative for cold intolerance, heat intolerance, polydipsia, polyphagia and polyuria.  Neurological:  Negative for dizziness, syncope, weakness, light-headedness, numbness and headaches.  Psychiatric/Behavioral: Negative.     Per HPI unless specifically indicated above     Objective:    BP 119/71    Pulse 89    Temp 98 F (36.7 C) (Oral)    Wt 198 lb 9.6 oz (90.1 kg)    SpO2 98%    BMI 28.50 kg/m   Wt Readings from Last 3 Encounters:  01/14/22 198 lb 9.6 oz (90.1 kg)  12/18/21 202 lb 9.6 oz (91.9 kg)  10/23/21 200 lb (90.7 kg)    Physical Exam Vitals and nursing note reviewed.  Constitutional:      General: He is awake. He is not in acute distress.    Appearance: He is well-developed and  well-groomed. He is not ill-appearing or toxic-appearing.  HENT:     Head: Normocephalic and atraumatic.     Right Ear: Hearing normal. No drainage.     Left Ear: Hearing normal. No drainage.  Eyes:     General: Lids are normal.        Right eye: No discharge.        Left eye: No discharge.     Conjunctiva/sclera: Conjunctivae normal.     Pupils: Pupils are equal, round, and reactive to light.  Neck:     Thyroid: No thyromegaly.     Vascular: No carotid bruit.  Cardiovascular:     Rate and Rhythm: Normal rate and regular rhythm.     Heart sounds: Normal heart sounds, S1 normal and S2 normal. No murmur heard.   No gallop.  Pulmonary:     Effort: Pulmonary effort is normal. No accessory muscle usage or respiratory distress.     Breath sounds: Normal breath sounds.  Abdominal:     General: Bowel sounds are normal. There is no distension.     Palpations: Abdomen is soft. There is no fluid wave, hepatomegaly or mass.     Tenderness: There is no abdominal tenderness. There is no right CVA tenderness, left CVA tenderness, guarding or rebound. Negative signs include Murphy's sign and McBurney's sign.     Hernia: No hernia is present.   Musculoskeletal:        General: Normal range of motion.     Cervical back: Normal range of motion and neck supple.     Right lower leg: No edema.     Left lower leg: No edema.  Skin:    General: Skin is warm and dry.     Capillary Refill: Capillary refill takes less than 2 seconds.     Findings: No rash.  Neurological:     Mental Status: He is alert and oriented to person, place, and time.     Motor: Motor function is intact.     Coordination: Coordination is intact.     Gait: Gait is intact.     Deep Tendon Reflexes: Reflexes are normal and symmetric.     Reflex Scores:      Brachioradialis reflexes are 2+ on the right side and 2+ on the left side.      Patellar reflexes are 2+ on the right side and 2+ on the left side. Psychiatric:        Attention and Perception: Attention normal.        Mood and Affect: Mood normal.        Speech: Speech normal.        Behavior: Behavior normal. Behavior is cooperative.        Thought Content: Thought content normal.   Results for orders placed or performed in visit on 01/14/22  Bayer DCA Hb A1c Waived  Result Value Ref Range   HB A1C (BAYER DCA - WAIVED) 5.2 4.8 - 5.6 %      Assessment & Plan:   Problem List Items Addressed This Visit       Cardiovascular and Mediastinum   Aortic atherosclerosis ([place])    This was noticed on November 2020 lung CT.  Continue statin therapy + recommend complete cessation of smoking.  Lipid panel today.      Relevant Medications   tadalafil (CIALIS) 5 MG tablet   atorvastatin (LIPITOR) 10 MG tablet   Other Relevant Orders   Comprehensive metabolic panel   Lipid Panel  w/o Chol/HDL Ratio   Carotid stenosis    Continue collaboration with vascular on yearly basis, to return 02/01/22.      Relevant Medications   tadalafil (CIALIS) 5 MG tablet   atorvastatin (LIPITOR) 10 MG tablet   Essential hypertension    Chronic, ongoing with BP improved  - suspect main exacerbation is his alcohol intake.  Continue current medication regimen as prescribed by Dr. Smith Mince at [facility] and continue this collaboration.  Continued alcohol use but is cutting back, continues to be a difficulty in maintaining and controlling BP level.  Recommend cutting back on alcohol and smoking.  Continue collaboration with cardiology. CMP, CBC, TSH today.  Return in 6 months.      Relevant Medications   tadalafil (CIALIS) 5 MG tablet   atorvastatin (LIPITOR) 10 MG tablet   Other Relevant Orders   CBC with Differential/Platelet   Comprehensive metabolic panel   TSH     Respiratory   Paraseptal emphysema (HCC) - Primary    Chronic, ongoing.  Continue current inhaler regimen at this time and recommend complete cessation of smoking.  Continue yearly lung screening -- recent done in December 2022.  Consider pulmonary referral in future.  Spirometry next visit.      Relevant Orders   CBC with Differential/Platelet     Digestive   GERD (gastroesophageal reflux disease)    Chronic, ongoing.  Continue Omeprazole daily and adjust as needed.  Repeat CMP, GGT, CBC today. Recent Hep labs negative. ?related to alcohol intake, have recommended cutting back.      Relevant Medications   omeprazole (PRILOSEC) 40 MG capsule     Nervous and Auditory   Nicotine dependence, cigarettes, w unsp disorders    I have recommended complete cessation of tobacco use. I have discussed various options available for assistance with tobacco cessation including over the counter methods (Nicotine gum, patch and lozenges). We also discussed prescription options (Chantix, Nicotine Inhaler / Nasal Spray). The patient is not interested in pursuing any prescription tobacco cessation options at this time.         Genitourinary   BPH (benign prostatic hyperplasia)    Chronic, ongoing.  Is taking Finasteride and Flomax daily.  Continue to collaborate with urology, currently patient does not wish to schedule follow-up with them.  Monitor PSA closely.       Relevant Medications   finasteride (PROSCAR) 5 MG tablet     Other   Alcohol dependence ([place])    Recommend continued cutting back -- concern for overall health with ongoing heavy use.  He is pondering rehab, highly recommend he go to [facility] and spend time in detox program.  Is aware of effect of heavy alcohol use on overall health, especially BP.   Check labs today to include CMP, CBC, iron, ferritin, B12.      Relevant Orders   Comprehensive metabolic panel   Vitamin Z12   Iron Binding Cap (TIBC)([facility])   Ferritin   Cervical stenosis of spinal canal    Ongoing, scheduled to see neurosurgery upcoming, appreciate their input.      Chronic post-traumatic stress disorder (PTSD)    Refer to schizophrenia plan of care.      Elevated serum GGT level    Recheck CMP, CBC, GTT today -- may pursue ultrasound to further assess area, recent CT was negative for acute findings.      Relevant Orders   Comprehensive metabolic panel   Gamma GT   History of lacunar cerebrovascular accident  History of infarct, continue collaboration with neuro and ENT for ongoing tinnitus, headaches, and dizziness.  Repeat MRI brain to further assess.      Relevant Orders   MR Brain W Wo Contrast   Mixed hyperlipidemia    Chronic, ongoing.  Continue current medication regimen and adjust as needed.  Lipid panel today.       Relevant Medications   tadalafil (CIALIS) 5 MG tablet   atorvastatin (LIPITOR) 10 MG tablet   Other Relevant Orders   Comprehensive metabolic panel   Lipid Panel w/o Chol/HDL Ratio   Schizophrenia (HCC)    Chronic, ongoing.  No current medications.  Tolerating this and does not wish to start medication.  Could consider Seroquel in future if needed or return to Clonidine.  Denies SI/HI.      Tinnitus of both ears    Ongoing issue since CVA with episodes daily.  Suspect more related to past CVA and heavy drinking.  Will repeat MRI brain and have recommended he return to  neurology for follow-up.      Other Visit Diagnoses     Dizziness       Refer to tinnitus plan of care   Relevant Orders   MR Brain W Wo Contrast   IFG (impaired fasting glucose)       History of elevation on labs, check A1c today.   Relevant Orders   Bayer DCA Hb A1c Waived (Completed)   Vitamin D deficiency       History of low levels reported, check today and start supplement as needed.   Relevant Orders   VITAMIN D 25 Hydroxy (Vit-D Deficiency, Fractures)        Follow up plan: Return in about 6 months (around 07/14/2022) for HTN/HLD, MOOD, COPD, ALCOHOL USE -- needs spirometry.

## 2022-01-14 NOTE — Assessment & Plan Note (Signed)
I have recommended complete cessation of tobacco use. I have discussed various options available for assistance with tobacco cessation including over the counter methods (Nicotine gum, patch and lozenges). We also discussed prescription options (Chantix, Nicotine Inhaler / Nasal Spray). The patient is not interested in pursuing any prescription tobacco cessation options at this time.  

## 2022-01-14 NOTE — Assessment & Plan Note (Addendum)
Chronic, ongoing.  Is taking Finasteride and Flomax daily.  Continue to collaborate with urology, currently patient does not wish to schedule follow-up with them.  Monitor PSA closely.

## 2022-01-14 NOTE — Assessment & Plan Note (Addendum)
Recommend continued cutting back -- concern for overall health with ongoing heavy use.  He is pondering rehab, highly recommend he go to [facility] and spend time in detox program.  Is aware of effect of heavy alcohol use on overall health, especially BP.   Check labs today to include CMP, CBC, iron, ferritin, B12.

## 2022-01-14 NOTE — Assessment & Plan Note (Addendum)
Chronic, ongoing.  No current medications.  Tolerating this and does not wish to start medication.  Could consider Seroquel in future if needed or return to Clonidine.  Denies SI/HI. 

## 2022-01-14 NOTE — Assessment & Plan Note (Signed)
Ongoing, scheduled to see neurosurgery upcoming, appreciate their input.

## 2022-01-14 NOTE — Assessment & Plan Note (Signed)
Chronic, ongoing.  Continue current medication regimen and adjust as needed. Lipid panel today. 

## 2022-01-14 NOTE — Assessment & Plan Note (Signed)
Chronic, ongoing with BP improved  - suspect main exacerbation is his alcohol intake. Continue current medication regimen as prescribed by Dr. Smith Mince at [facility] and continue this collaboration.  Continued alcohol use but is cutting back, continues to be a difficulty in maintaining and controlling BP level.  Recommend cutting back on alcohol and smoking.  Continue collaboration with cardiology. CMP, CBC, TSH today.  Return in 6 months.

## 2022-01-14 NOTE — Assessment & Plan Note (Signed)
This was noticed on November 2020 lung CT.  Continue statin therapy + recommend complete cessation of smoking.  Lipid panel today. 

## 2022-01-14 NOTE — Assessment & Plan Note (Signed)
Ongoing issue since CVA with episodes daily.  Suspect more related to past CVA and heavy drinking.  Will repeat MRI brain and have recommended he return to neurology for follow-up.

## 2022-01-14 NOTE — Assessment & Plan Note (Signed)
Refer to schizophrenia plan of care. 

## 2022-01-15 ENCOUNTER — Other Ambulatory Visit: Payer: Self-pay | Admitting: Nurse Practitioner

## 2022-01-15 ENCOUNTER — Encounter: Payer: Self-pay | Admitting: Nurse Practitioner

## 2022-01-15 DIAGNOSIS — D500 Iron deficiency anemia secondary to blood loss (chronic): Secondary | ICD-10-CM

## 2022-01-15 LAB — CBC WITH DIFFERENTIAL/PLATELET
Basophils Absolute: 0.1 10*3/uL (ref 0.0–0.2)
Basos: 1 %
EOS (ABSOLUTE): 0.1 10*3/uL (ref 0.0–0.4)
Eos: 1 %
Hematocrit: 41.5 % (ref 37.5–51.0)
Hemoglobin: 14.8 g/dL (ref 13.0–17.7)
Immature Grans (Abs): 0 10*3/uL (ref 0.0–0.1)
Immature Granulocytes: 0 %
Lymphocytes Absolute: 2.9 10*3/uL (ref 0.7–3.1)
Lymphs: 24 %
MCH: 31.3 pg (ref 26.6–33.0)
MCHC: 35.7 g/dL (ref 31.5–35.7)
MCV: 88 fL (ref 79–97)
Monocytes Absolute: 1 10*3/uL — ABNORMAL HIGH (ref 0.1–0.9)
Monocytes: 8 %
Neutrophils Absolute: 7.8 10*3/uL — ABNORMAL HIGH (ref 1.4–7.0)
Neutrophils: 66 %
Platelets: 407 10*3/uL (ref 150–450)
RBC: 4.73 x10E6/uL (ref 4.14–5.80)
RDW: 12.5 % (ref 11.6–15.4)
WBC: 12 10*3/uL — ABNORMAL HIGH (ref 3.4–10.8)

## 2022-01-15 LAB — COMPREHENSIVE METABOLIC PANEL
ALT: 23 IU/L (ref 0–44)
AST: 26 IU/L (ref 0–40)
Albumin/Globulin Ratio: 1.5 (ref 1.2–2.2)
Albumin: 4.4 g/dL (ref 3.8–4.9)
Alkaline Phosphatase: 109 IU/L (ref 44–121)
BUN/Creatinine Ratio: 15 (ref 9–20)
BUN: 14 mg/dL (ref 6–24)
Bilirubin Total: 0.4 mg/dL (ref 0.0–1.2)
CO2: 25 mmol/L (ref 20–29)
Calcium: 9.5 mg/dL (ref 8.7–10.2)
Chloride: 99 mmol/L (ref 96–106)
Creatinine, Ser: 0.92 mg/dL (ref 0.76–1.27)
Globulin, Total: 3 g/dL (ref 1.5–4.5)
Glucose: 74 mg/dL (ref 70–99)
Potassium: 4.2 mmol/L (ref 3.5–5.2)
Sodium: 137 mmol/L (ref 134–144)
Total Protein: 7.4 g/dL (ref 6.0–8.5)
eGFR: 96 mL/min/{1.73_m2} (ref >59)

## 2022-01-15 LAB — GAMMA GT: GGT: 102 IU/L — ABNORMAL HIGH (ref 0–65)

## 2022-01-15 LAB — LIPID PANEL W/O CHOL/HDL RATIO
Cholesterol, Total: 149 mg/dL (ref 100–199)
HDL: 53 mg/dL (ref >39)
LDL Chol Calc (NIH): 62 mg/dL (ref 0–99)
Triglycerides: 213 mg/dL — ABNORMAL HIGH (ref 0–149)
VLDL Cholesterol Cal: 34 mg/dL (ref 5–40)

## 2022-01-15 LAB — IRON AND TIBC
Iron Saturation: 8 % — CL (ref 15–55)
Iron: 24 ug/dL — ABNORMAL LOW (ref 38–169)
Total Iron Binding Capacity: 315 ug/dL (ref 250–450)
UIBC: 291 ug/dL (ref 111–343)

## 2022-01-15 LAB — TSH: TSH: 1.7 u[IU]/mL (ref 0.450–4.500)

## 2022-01-15 LAB — VITAMIN B12: Vitamin B-12: 654 pg/mL (ref 232–1245)

## 2022-01-15 LAB — FERRITIN: Ferritin: 164 ng/mL (ref 30–400)

## 2022-01-15 LAB — VITAMIN D 25 HYDROXY (VIT D DEFICIENCY, FRACTURES): Vit D, 25-Hydroxy: 22 ng/mL — ABNORMAL LOW (ref 30.0–100.0)

## 2022-01-15 MED ORDER — VITAMIN D3 25 MCG (1000 UT) PO CAPS: 2000.0000 [IU] | ORAL_CAPSULE | Freq: Every day | ORAL | 4 refills | Status: DC

## 2022-01-15 MED ORDER — IRON-VITAMIN C 65-125 MG PO TABS: 1.0000 | ORAL_TABLET | Freq: Every day | ORAL | 4 refills | Status: DC

## 2022-01-15 NOTE — Progress Notes (Signed)
Contacted via [place] - although please call to ensure he received below message due to changes being made:  Good evening Luke Briggs, your labs have returned, overall cholesterol, kidney/liver, and thyroid labs are stable.  However the following labs I want to address: - Iron level is low, although hemoglobin and hematocrit are normal at this time.  I would like you to start taking an iron supplement daily and will send this in.  It may change the color of your stools to a darker color, just to be aware.  I also would like you to schedule to return to GI to further assess.   - Vitamin D level is low.  I will send in a Vitamin D supplement for you to take daily which is important for bones and muscle health. - CBC shows mild elevation in white blood cell count and neutrophils, have you been sick recently?  I would like to recheck this on outpatient labs in 6 weeks, along with iron level.  If any illness prior to labs visit then return to office sooner to see me.  Please schedule lab visit at this time.  Any questions? Keep being stellar!!  Thank you for allowing me to participate in your care.  I appreciate you. Kindest regards, Belenda Alviar

## 2022-01-16 ENCOUNTER — Encounter: Payer: Self-pay | Admitting: Nurse Practitioner

## 2022-01-21 ENCOUNTER — Telehealth: Payer: Self-pay | Admitting: Nurse Practitioner

## 2022-01-21 ENCOUNTER — Other Ambulatory Visit: Payer: Self-pay | Admitting: Nurse Practitioner

## 2022-01-21 MED ORDER — LORAZEPAM 1 MG PO TABS: ORAL_TABLET | ORAL | 1 refills | Status: DC

## 2022-01-21 NOTE — Telephone Encounter (Signed)
Copied from [place] [id number]. Topic: General - Other ?>> Jan 21, 2022 11:29 AM Antonieta Iba C wrote: ?Reason for CRM: pt called in to update provider that he is scheduled to have his MRI this Friday at 9 am. Pt says that provider usually send in something to the pharmacy to help him relax while having it.  ? ? ?Pharmacy: [place], [place] - [address]  ?Phone:  [phone] ?Fax:  [phone] ? ?Pt would like further assistance. ?

## 2022-01-22 NOTE — Telephone Encounter (Signed)
Patient was notified and verbalized understanding.

## 2022-01-25 ENCOUNTER — Ambulatory Visit
Admission: RE | Admit: 2022-01-25 | Discharge: 2022-01-25 | Disposition: A | Discharge status: Home / Self Care | Payer: Medicaid Other | Source: Ambulatory Visit | Attending: Nurse Practitioner | Admitting: Nurse Practitioner

## 2022-01-25 ENCOUNTER — Other Ambulatory Visit: Payer: Self-pay

## 2022-01-25 DIAGNOSIS — Z8673 Personal history of transient ischemic attack (TIA), and cerebral infarction without residual deficits: Secondary | ICD-10-CM | POA: Diagnosis not present

## 2022-01-25 DIAGNOSIS — R42 Dizziness and giddiness: Secondary | ICD-10-CM

## 2022-01-25 IMAGING — MR MR HEAD WO/W CM
13 series · 48 of 48 positions shown · IV contrast (gadavist)
Comparison: CTA head [DATE], MR head [DATE]

CLINICAL DATA: Prior head injuries in the past, dizziness and
parietal headaches for 8 years, worsening

EXAM:
MRI HEAD WITHOUT AND WITH CONTRAST
TECHNIQUE: Multiplanar, multiecho pulse sequences of the brain and surrounding
structures were obtained without and with intravenous contrast.
CONTRAST:  9mL GADAVIST GADOBUTROL 1 MMOL/ML IV SOLN

[Series 5: ax dwi_tracew · axial · 3.0mm · 0.65mm/px · z∈[-94,+61]mm · 3 of 48 slices shown]
[im 1/48]
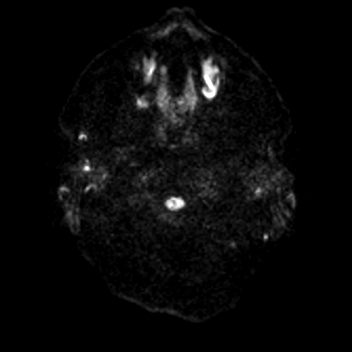
[im 24/48]
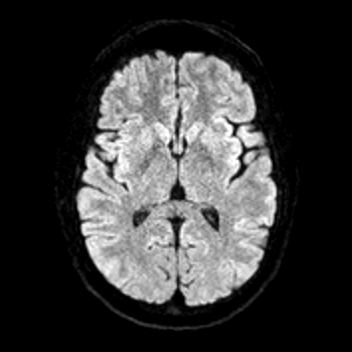
[im 48/48]
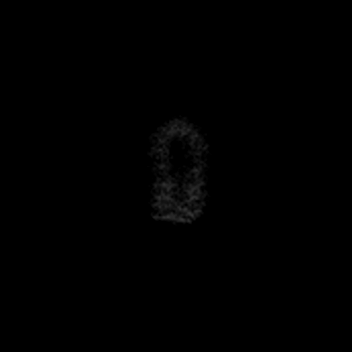

[Series 6: ax dwi_adc · axial · 3.0mm · 0.65mm/px · z∈[-94,+61]mm · 3 of 48 slices shown]
[im 1/48]
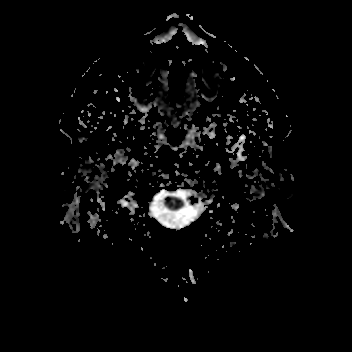
[im 24/48]
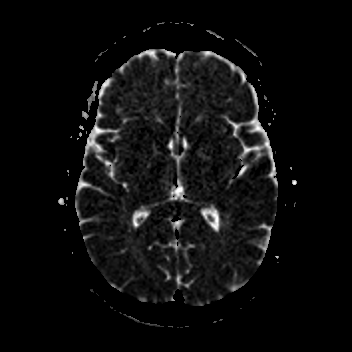
[im 48/48]
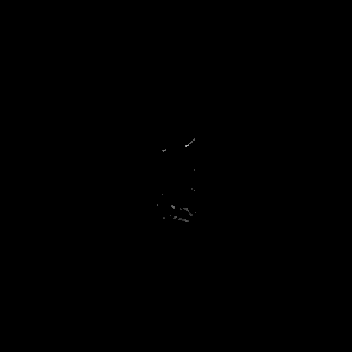

[Series 7: cor dwi_tracew · coronal · 5.0mm · 0.60mm/px · 2 of 38 slices shown]
[im 1/38]
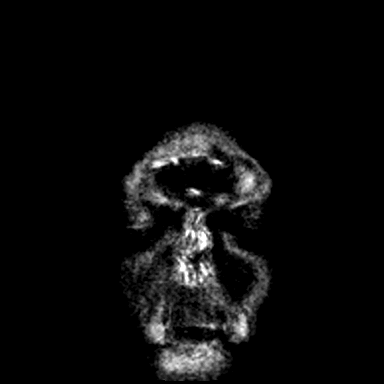
[im 38/38]
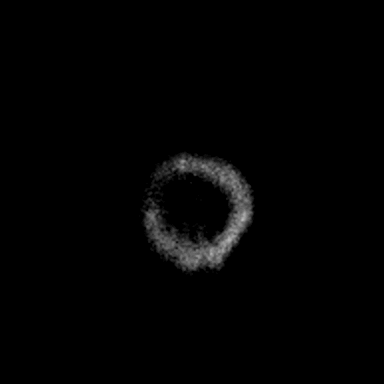

[Series 8: cor dwi_adc · coronal · 5.0mm · 0.60mm/px · 2 of 38 slices shown]
[im 1/38]
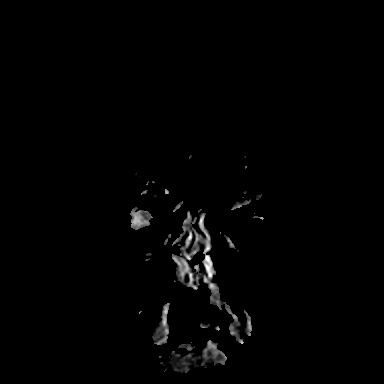
[im 38/38]
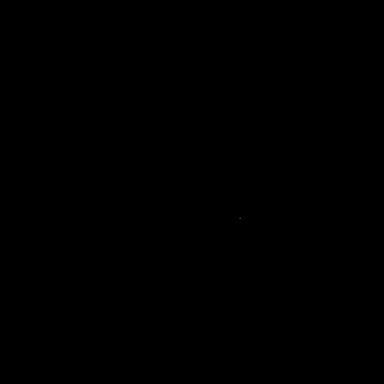

[Series 9: T1 · sagittal · 5.0mm · 0.62mm/px · 1 of 23 slices shown (1 of 2)]
[im 1/23]
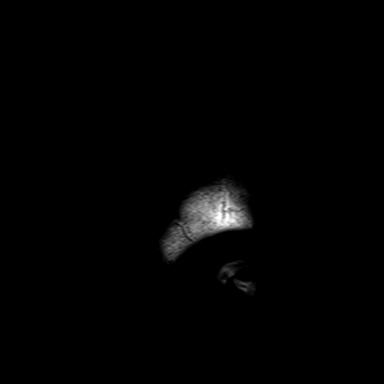

[Series 10: T2 · axial · 5.0mm · 0.53mm/px · z∈[-95,+61]mm · 2 of 27 slices shown]
[im 1/27]
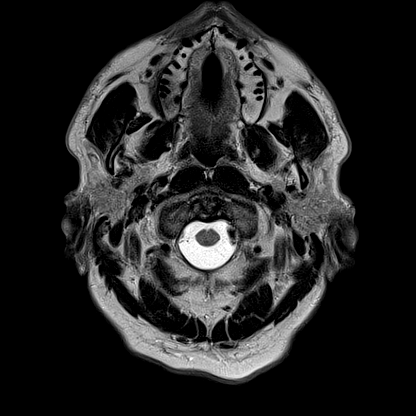
[im 27/27]
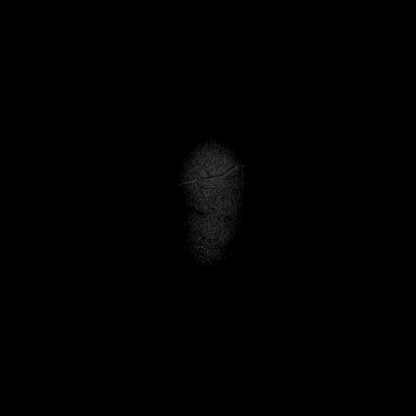

[Series 12: pha_images · axial · 3.0mm · 0.90mm/px · z∈[-105,+71]mm · 4 of 60 slices shown]
[im 1/60]
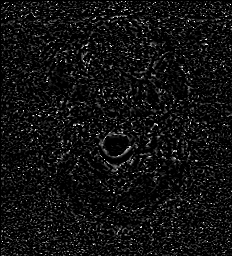
[im 20/60]
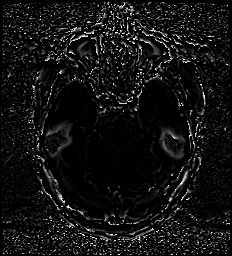
[im 40/60]
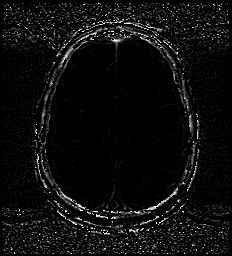
[im 60/60]
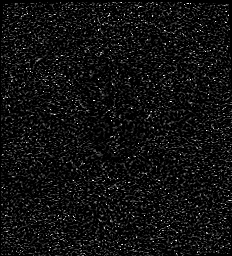

[Series 13: swi_images · axial · 3.0mm · 0.90mm/px · z∈[-105,+71]mm · 4 of 60 slices shown]
[im 1/60]
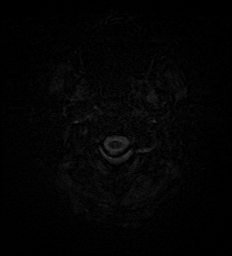
[im 20/60]
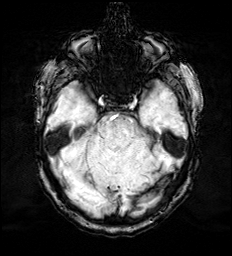
[im 40/60]
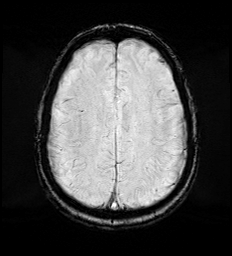
[im 60/60]
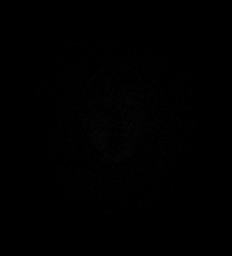

[Series 15: FLAIR · axial · 3.0mm · 0.53mm/px · z∈[-98,+64]mm · 3 of 55 slices shown]
[im 1/55]
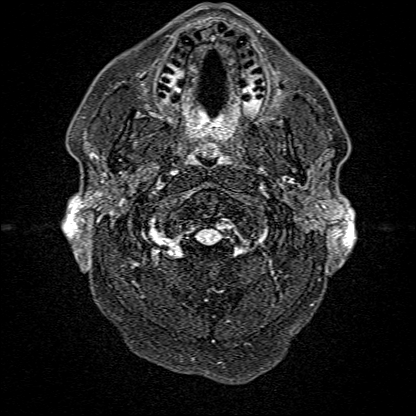
[im 28/55]
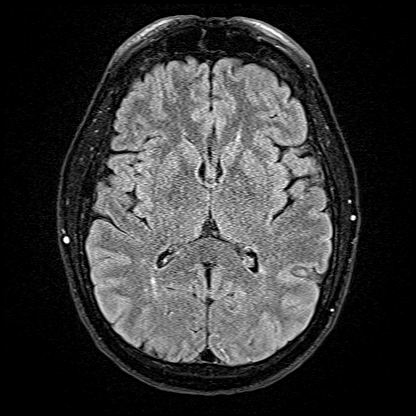
[im 55/55]
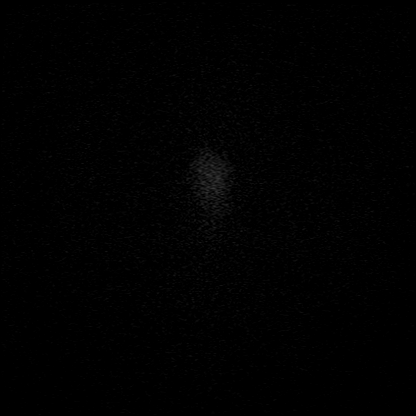

[Series 16: T1 · axial · 1.0mm · 0.98mm/px · z∈[-96,+63]mm · 10 of 160 slices shown (2 of 2)]
[im 1/160]
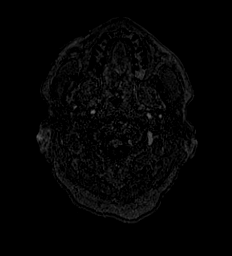
[im 18/160]
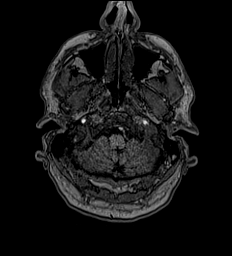
[im 36/160]
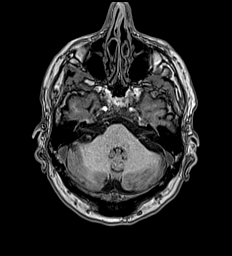
[im 54/160]
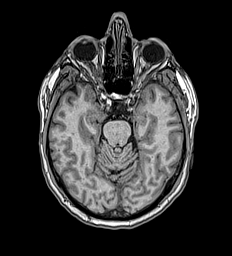
[im 71/160]
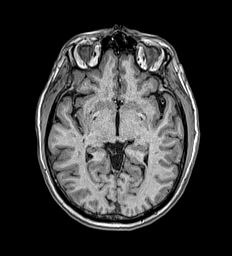
[im 89/160]
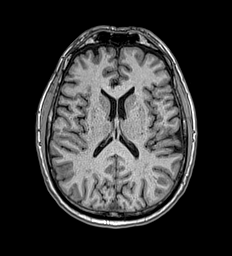
[im 107/160]
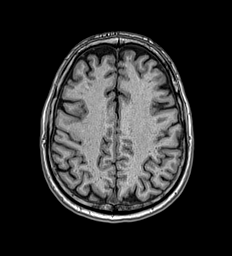
[im 124/160]
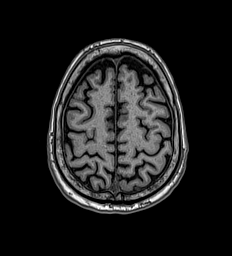
[im 142/160]
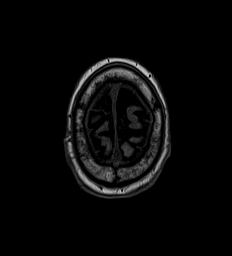
[im 160/160]
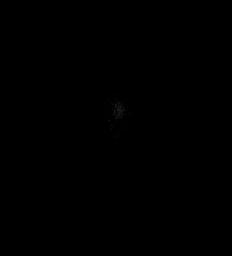

[Series 17: T2 post-contrast · coronal · 5.0mm · 0.57mm/px · 2 of 29 slices shown]
[im 1/29]
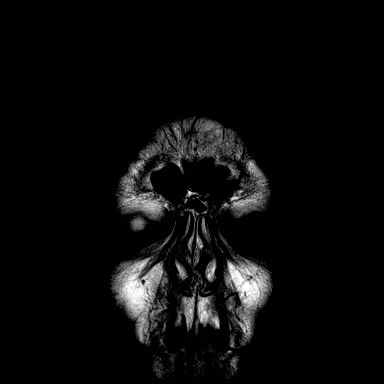
[im 29/29]
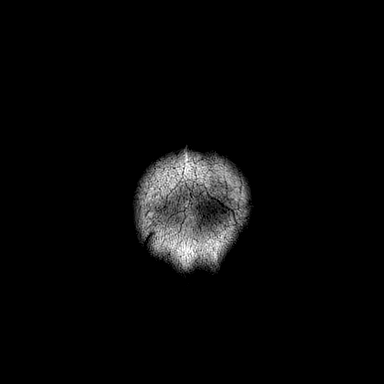

[Series 18: T1 post-contrast · axial · 1.0mm · 0.98mm/px · z∈[-96,+63]mm · 10 of 160 slices shown (1 of 2)]
[im 1/160]
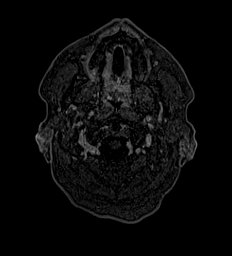
[im 18/160]
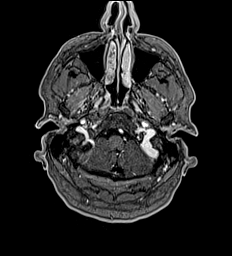
[im 36/160]
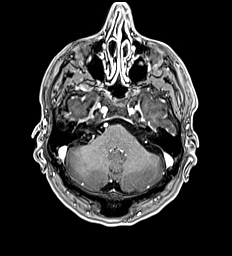
[im 54/160]
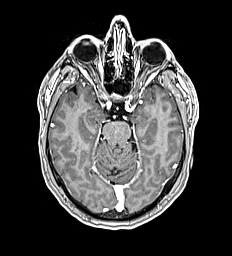
[im 71/160]
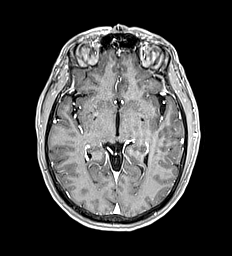
[im 89/160]
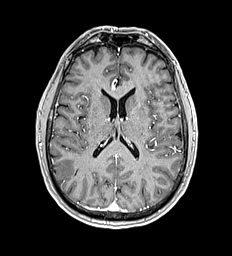
[im 107/160]
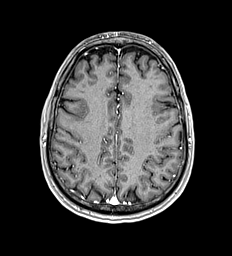
[im 124/160]
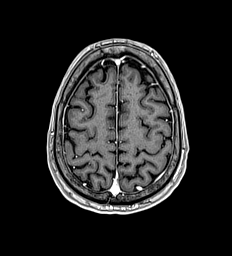
[im 142/160]
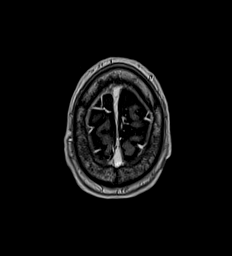
[im 160/160]
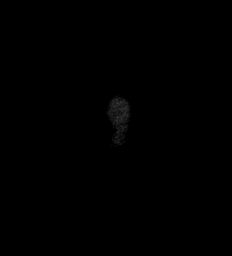

[Series 19: T1 post-contrast · coronal · 5.0mm · 0.57mm/px · 2 of 29 slices shown (2 of 2)]
[im 1/29]
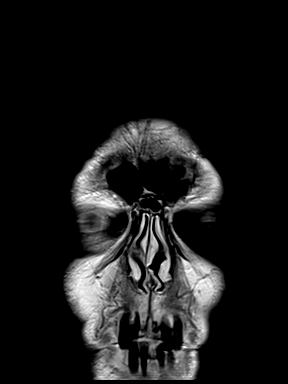
[im 29/29]
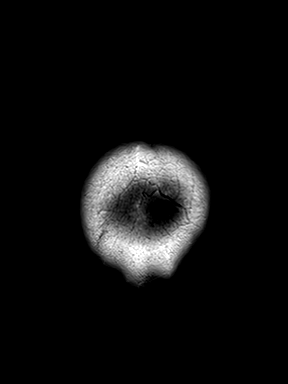

[48 of 48 positions shown; findings below may reference images not displayed]

FINDINGS: Brain: There is no evidence of acute intracranial hemorrhage,
extra-axial fluid collection, or acute infarct.

Parenchymal volume is normal. The ventricles are normal in size.
There is a small remote lacunar infarct in the right caudate
tail/corona radiata. Additional small foci of FLAIR signal
abnormality in the subcortical and periventricular white matter
likely reflects sequela of mild chronic white matter
microangiopathy.

There is no suspicious parenchymal signal abnormality. There is no
mass lesion or abnormal enhancement. There is no mass effect or
midline shift.

Vascular: Normal flow voids.

Skull and upper cervical spine: Normal marrow signal.

Sinuses/Orbits: The paranasal sinuses are clear. A left lens implant
is noted. The globes and orbits are otherwise unremarkable.

Other: None.
IMPRESSION: 1. No acute intracranial pathology to explain the patient's
symptoms.
2. Unchanged small remote lacunar infarct in the right caudate
tail/corona radiata on a background of mild chronic white matter
microangiopathy.

## 2022-01-25 MED ORDER — GADOBUTROL 1 MMOL/ML IV SOLN
9.0000 mL | Freq: Once | INTRAVENOUS | Status: AC | PRN
  Administered 2022-01-25: 9 mL via INTRAVENOUS

## 2022-01-25 NOTE — Progress Notes (Signed)
Contacted via [place] ? ? ?Good news Talik, your MRI returned and shows no changes concerning for new stroke.  Past stroke area remains unchanged.  Any questions? ?Keep being stellar!!  Thank you for allowing me to participate in your care.  I appreciate you. ?Kindest regards, ?Gerica Koble ?

## 2022-01-31 ENCOUNTER — Encounter (INDEPENDENT_AMBULATORY_CARE_PROVIDER_SITE_OTHER): Payer: Medicaid Other

## 2022-01-31 ENCOUNTER — Ambulatory Visit (INDEPENDENT_AMBULATORY_CARE_PROVIDER_SITE_OTHER): Payer: Medicaid Other | Admitting: Vascular Surgery

## 2022-02-01 ENCOUNTER — Encounter: Payer: Self-pay | Admitting: Nurse Practitioner

## 2022-02-01 ENCOUNTER — Other Ambulatory Visit: Payer: Self-pay

## 2022-02-01 ENCOUNTER — Ambulatory Visit (INDEPENDENT_AMBULATORY_CARE_PROVIDER_SITE_OTHER): Payer: Medicaid Other | Admitting: Nurse Practitioner

## 2022-02-01 VITALS — BP 128/80 | HR 84 | Temp 98.0°F | Ht 70.0 in | Wt 198.2 lb

## 2022-02-01 DIAGNOSIS — D500 Iron deficiency anemia secondary to blood loss (chronic): Secondary | ICD-10-CM | POA: Diagnosis not present

## 2022-02-01 DIAGNOSIS — L270 Generalized skin eruption due to drugs and medicaments taken internally: Secondary | ICD-10-CM | POA: Diagnosis not present

## 2022-02-01 MED ORDER — PREDNISONE 10 MG PO TABS
ORAL_TABLET | ORAL | 0 refills | Status: DC
Start: 2022-02-01 — End: 2022-05-28

## 2022-02-01 NOTE — Assessment & Plan Note (Signed)
Suspect from contrast recently, appeared a couple days after this.  No other new medications or products at home.  Will send in Prednisone taper.  He has Triamcinolone he is picking up today.  Recommend use of Benadryl as needed + taking Claritin daily until rash cleared.  May do oatmeal baths daily for comfort.  Return to office in one week.  Added contrast to his allergy list and recommend he avoid this. ?

## 2022-02-01 NOTE — Patient Instructions (Signed)
Rash, Adult  A rash is a change in the color of your skin. A rash can also change the way your skin feels. There are many different conditions and factors that can causea rash. Follow these instructions at home: The goal of treatment is to stop the itching and keep the rash from spreading. Watch for any changes in your symptoms. Let your doctor know about them. Followthese instructions to help with your condition: Medicine Take or apply over-the-counter and prescription medicines only as told by your doctor. These may include medicines: To treat red or swollen skin (corticosteroid creams). To treat itching. To treat an allergy (oral antihistamines). To treat very bad symptoms (oral corticosteroids).  Skin care Put cool cloths (compresses) on the affected areas. Do not scratch or rub your skin. Avoid covering the rash. Make sure that the rash is exposed to air as much as possible. Managing itching and discomfort Avoid hot showers or baths. These can make itching worse. A cold shower may help. Try taking a bath with: Epsom salts. You can get these at your local pharmacy or grocery store. Follow the instructions on the package. Baking soda. Pour a small amount into the bath as told by your doctor. Colloidal oatmeal. You can get this at your local pharmacy or grocery store. Follow the instructions on the package. Try putting baking soda paste onto your skin. Stir water into baking soda until it gets like a paste. Try putting on a lotion that relieves itchiness (calamine lotion). Keep cool and out of the sun. Sweating and being hot can make itching worse. General instructions  Rest as needed. Drink enough fluid to keep your pee (urine) pale yellow. Wear loose-fitting clothing. Avoid scented soaps, detergents, and perfumes. Use gentle soaps, detergents, perfumes, and other cosmetic products. Avoid anything that causes your rash. Keep a journal to help track what causes your rash. Write  down: What you eat. What cosmetic products you use. What you drink. What you wear. This includes jewelry. Keep all follow-up visits as told by your doctor. This is important.  Contact a doctor if: You sweat at night. You lose weight. You pee (urinate) more than normal. You pee less than normal, or you notice that your pee is a darker color than normal. You feel weak. You throw up (vomit). Your skin or the whites of your eyes look yellow (jaundice). Your skin: Tingles. Is numb. Your rash: Does not go away after a few days. Gets worse. You are: More thirsty than normal. More tired than normal. You have: New symptoms. Pain in your belly (abdomen). A fever. Watery poop (diarrhea). Get help right away if: You have a fever and your symptoms suddenly get worse. You start to feel mixed up (confused). You have a very bad headache or a stiff neck. You have very bad joint pains or stiffness. You have jerky movements that you cannot control (seizure). Your rash covers all or most of your body. The rash may or may not be painful. You have blisters that: Are on top of the rash. Grow larger. Grow together. Are painful. Are inside your nose or mouth. You have a rash that: Looks like purple pinprick-sized spots all over your body. Has a "bull's eye" or looks like a target. Is red and painful, causes your skin to peel, and is not from being in the sun too long. Summary A rash is a change in the color of your skin. A rash can also change the way your skin feels.   The goal of treatment is to stop the itching and keep the rash from spreading. Take or apply over-the-counter and prescription medicines only as told by your doctor. Contact a doctor if you have new symptoms or symptoms that get worse. Keep all follow-up visits as told by your doctor. This is important. This information is not intended to replace advice given to you by your health care provider. Make sure you discuss any  questions you have with your healthcare provider. Document Revised: 02/26/2019 Document Reviewed: 06/08/2018 Elsevier Patient Education  2022 [employer] Inc.  

## 2022-02-01 NOTE — Progress Notes (Signed)
? ?BP 128/80   Pulse 84   Temp 98 ?F (36.7 ?C) (Oral)   Ht _0  (1.778 m)   Wt 198 lb 3.2 oz (89.9 kg)   SpO2 100%   BMI 28.44 kg/m?   ? ?Subjective:  ? ? Patient ID: Luke Deady., male    DOB: 13-Nov-1961, 61 y.o.   MRN: [id number] ? ?HPI: ?Luke Satter. is a 61 y.o. male ? ?Chief Complaint  ?Patient presents with  ? Rash  ?  Patient states he has a rash from his head to his toes. Patient states the rash just showed up and gradually gotten worse. Patient he first noticed it about 2-3 weeks. Patient has been taking Keflex antibiotic to help with his bladder infection. Patient states the rash is itching so. Patient states he has been using a steroid cream and it has not helped. Patient states he has tried Benadryl and it does not help just makes him sleepy.   ? ?RASH ?First noticed this 2-3 weeks ago, had some leftover Keflex and started to take it yesterday and this did not help clear it.  Started on his abdomen and then moved up. No once else in household has it.  Did have contrast during recent CT and rash showed up after this.  Has Triamcinolone cream at pharmacy to pick up. ?Duration:  weeks  ?Location: generalized  ?Itching: yes ?Burning: no ?Redness: yes ?Oozing: no ?Scaling: no ?Blisters: no ?Painful: no ?Fevers: no ?Change in detergents/soaps/personal care products: no ?Recent illness: no ?Recent travel:no ?History of same: no ?Context: fluctuating ?Alleviating factors: hydrocortisone cream ?Treatments attempted:hydrocortisone cream and leftover Keflex ?Shortness of breath: no  ?Throat/tongue swelling: no ?Myalgias/arthralgias: no  ? ?Relevant past medical, surgical, family and social history reviewed and updated as indicated. Interim medical history since our last visit reviewed. ?Allergies and medications reviewed and updated. ? ?Review of Systems  ?Constitutional:  Negative for activity change, chills, diaphoresis, fatigue and fever.  ?Respiratory:  Negative for cough, chest  tightness, shortness of breath and wheezing.   ?Cardiovascular:  Negative for chest pain, palpitations and leg swelling.  ?Gastrointestinal: Negative.   ?Endocrine: Negative for cold intolerance, heat intolerance, polydipsia, polyphagia and polyuria.  ?Neurological:  Negative for dizziness, syncope, weakness, light-headedness, numbness and headaches.  ?Psychiatric/Behavioral: Negative.    ? ?Per HPI unless specifically indicated above ? ?   ?Objective:  ?  ?BP 128/80   Pulse 84   Temp 98 ?F (36.7 ?C) (Oral)   Ht _1  (1.778 m)   Wt 198 lb 3.2 oz (89.9 kg)   SpO2 100%   BMI 28.44 kg/m?   ?Wt Readings from Last 3 Encounters:  ?02/01/22 198 lb 3.2 oz (89.9 kg)  ?01/14/22 198 lb 9.6 oz (90.1 kg)  ?12/18/21 202 lb 9.6 oz (91.9 kg)  ?  ?Physical Exam ?Vitals and nursing note reviewed.  ?Constitutional:   ?   General: He is awake. He is not in acute distress. ?   Appearance: He is well-developed and well-groomed. He is not ill-appearing or toxic-appearing.  ?HENT:  ?   Head: Normocephalic and atraumatic.  ?   Right Ear: Hearing normal. No drainage.  ?   Left Ear: Hearing normal. No drainage.  ?Eyes:  ?   General: Lids are normal.     ?   Right eye: No discharge.     ?   Left eye: No discharge.  ?   Conjunctiva/sclera: Conjunctivae normal.  ?   Pupils:  Pupils are equal, round, and reactive to light.  ?Neck:  ?   Thyroid: No thyromegaly.  ?   Vascular: No carotid bruit.  ?Cardiovascular:  ?   Rate and Rhythm: Normal rate and regular rhythm.  ?   Heart sounds: Normal heart sounds, S1 normal and S2 normal. No murmur heard. ?  No gallop.  ?Pulmonary:  ?   Effort: Pulmonary effort is normal. No accessory muscle usage or respiratory distress.  ?   Breath sounds: Normal breath sounds.  ?Abdominal:  ?   General: Bowel sounds are normal. There is no distension.  ?   Palpations: Abdomen is soft. There is no hepatomegaly.  ?Musculoskeletal:     ?   General: Normal range of motion.  ?   Cervical back: Normal range of motion and  neck supple.  ?   Right lower leg: No edema.  ?   Left lower leg: No edema.  ?Skin: ?   General: Skin is warm and dry.  ?   Capillary Refill: Capillary refill takes less than 2 seconds.  ?   Findings: Rash present. Rash is urticarial.  ?   Comments: Urticarial rash from head to toe, most prominent on abdomen with welting.  ?Neurological:  ?   Mental Status: He is alert and oriented to person, place, and time.  ?   Motor: Motor function is intact.  ?   Coordination: Coordination is intact.  ?   Gait: Gait is intact.  ?   Deep Tendon Reflexes: Reflexes are normal and symmetric.  ?   Reflex Scores: ?     Brachioradialis reflexes are 2+ on the right side and 2+ on the left side. ?     Patellar reflexes are 2+ on the right side and 2+ on the left side. ?Psychiatric:     ?   Attention and Perception: Attention normal.     ?   Mood and Affect: Mood normal.     ?   Speech: Speech normal.     ?   Behavior: Behavior normal. Behavior is cooperative.     ?   Thought Content: Thought content normal.  ? ?Results for orders placed or performed in visit on 01/14/22  ?Bayer DCA Hb A1c Waived  ?Result Value Ref Range  ? HB A1C (BAYER DCA - WAIVED) 5.2 4.8 - 5.6 %  ?CBC with Differential/Platelet  ?Result Value Ref Range  ? WBC 12.0 (H) 3.4 - 10.8 x10E3/uL  ? RBC 4.73 4.14 - 5.80 x10E6/uL  ? Hemoglobin 14.8 13.0 - 17.7 g/dL  ? Hematocrit 41.5 37.5 - 51.0 %  ? MCV 88 79 - 97 fL  ? MCH 31.3 26.6 - 33.0 pg  ? MCHC 35.7 31.5 - 35.7 g/dL  ? RDW 12.5 11.6 - 15.4 %  ? Platelets 407 150 - 450 x10E3/uL  ? Neutrophils 66 Not Estab. %  ? Lymphs 24 Not Estab. %  ? Monocytes 8 Not Estab. %  ? Eos 1 Not Estab. %  ? Basos 1 Not Estab. %  ? Neutrophils Absolute 7.8 (H) 1.4 - 7.0 x10E3/uL  ? Lymphocytes Absolute 2.9 0.7 - 3.1 x10E3/uL  ? Monocytes Absolute 1.0 (H) 0.1 - 0.9 x10E3/uL  ? EOS (ABSOLUTE) 0.1 0.0 - 0.4 x10E3/uL  ? Basophils Absolute 0.1 0.0 - 0.2 x10E3/uL  ? Immature Granulocytes 0 Not Estab. %  ? Immature Grans (Abs) 0.0 0.0 - 0.1  x10E3/uL  ?Comprehensive metabolic panel  ?Result Value Ref Range  ?  Glucose 74 70 - 99 mg/dL  ? BUN 14 6 - 24 mg/dL  ? Creatinine, Ser 0.92 0.76 - 1.27 mg/dL  ? eGFR 96 >59 mL/min/1.73  ? BUN/Creatinine Ratio 15 9 - 20  ? Sodium 137 134 - 144 mmol/L  ? Potassium 4.2 3.5 - 5.2 mmol/L  ? Chloride 99 96 - 106 mmol/L  ? CO2 25 20 - 29 mmol/L  ? Calcium 9.5 8.7 - 10.2 mg/dL  ? Total Protein 7.4 6.0 - 8.5 g/dL  ? Albumin 4.4 3.8 - 4.9 g/dL  ? Globulin, Total 3.0 1.5 - 4.5 g/dL  ? Albumin/Globulin Ratio 1.5 1.2 - 2.2  ? Bilirubin Total 0.4 0.0 - 1.2 mg/dL  ? Alkaline Phosphatase 109 44 - 121 IU/L  ? AST 26 0 - 40 IU/L  ? ALT 23 0 - 44 IU/L  ?Lipid Panel w/o Chol/HDL Ratio  ?Result Value Ref Range  ? Cholesterol, Total 149 100 - 199 mg/dL  ? Triglycerides 213 (H) 0 - 149 mg/dL  ? HDL 53 >39 mg/dL  ? VLDL Cholesterol Cal 34 5 - 40 mg/dL  ? LDL Chol Calc (NIH) 62 0 - 99 mg/dL  ?Gamma GT  ?Result Value Ref Range  ? GGT 102 (H) 0 - 65 IU/L  ?TSH  ?Result Value Ref Range  ? TSH 1.700 0.450 - 4.500 uIU/mL  ?Vitamin B12  ?Result Value Ref Range  ? Vitamin B-12 654 232 - 1,245 pg/mL  ?VITAMIN D 25 Hydroxy (Vit-D Deficiency, Fractures)  ?Result Value Ref Range  ? Vit D, 25-Hydroxy 22.0 (L) 30.0 - 100.0 ng/mL  ?Iron Binding Cap (TIBC)([facility])  ?Result Value Ref Range  ? Total Iron Binding Capacity 315 250 - 450 ug/dL  ? UIBC 291 111 - 343 ug/dL  ? Iron 24 (L) 38 - 169 ug/dL  ? Iron Saturation 8 (LL) 15 - 55 %  ?Ferritin  ?Result Value Ref Range  ? Ferritin 164 30 - 400 ng/mL  ? ?   ?Assessment & Plan:  ? ?Problem List Items Addressed This Visit   ? ?  ? Musculoskeletal and Integument  ? Allergic drug rash - Primary  ?  Suspect from contrast recently, appeared a couple days after this.  No other new medications or products at home.  Will send in Prednisone taper.  He has Triamcinolone he is picking up today.  Recommend use of Benadryl as needed + taking Claritin daily until rash cleared.  May do oatmeal baths daily for  comfort.  Return to office in one week.  Added contrast to his allergy list and recommend he avoid this. ?  ?  ? ?Other Visit Diagnoses   ? ? Iron deficiency anemia due to chronic blood loss      ? Repeat labs today.  ? ?  ?  ? ?Fo

## 2022-02-02 LAB — CBC WITH DIFFERENTIAL/PLATELET
Basophils Absolute: 0 10*3/uL (ref 0.0–0.2)
Basos: 0 %
EOS (ABSOLUTE): 0.1 10*3/uL (ref 0.0–0.4)
Eos: 1 %
Hematocrit: 43.4 % (ref 37.5–51.0)
Hemoglobin: 14.6 g/dL (ref 13.0–17.7)
Immature Grans (Abs): 0 10*3/uL (ref 0.0–0.1)
Immature Granulocytes: 0 %
Lymphocytes Absolute: 1.5 10*3/uL (ref 0.7–3.1)
Lymphs: 16 %
MCH: 30.3 pg (ref 26.6–33.0)
MCHC: 33.6 g/dL (ref 31.5–35.7)
MCV: 90 fL (ref 79–97)
Monocytes Absolute: 0.9 10*3/uL (ref 0.1–0.9)
Monocytes: 9 %
Neutrophils Absolute: 7.1 10*3/uL — ABNORMAL HIGH (ref 1.4–7.0)
Neutrophils: 74 %
Platelets: 362 10*3/uL (ref 150–450)
RBC: 4.82 x10E6/uL (ref 4.14–5.80)
RDW: 13.1 % (ref 11.6–15.4)
WBC: 9.7 10*3/uL (ref 3.4–10.8)

## 2022-02-02 LAB — FERRITIN: Ferritin: 206 ng/mL (ref 30–400)

## 2022-02-02 LAB — IRON AND TIBC
Iron Saturation: 22 % (ref 15–55)
Iron: 71 ug/dL (ref 38–169)
Total Iron Binding Capacity: 319 ug/dL (ref 250–450)
UIBC: 248 ug/dL (ref 111–343)

## 2022-02-02 NOTE — Progress Notes (Signed)
Contacted via [facility] ? ? ?Good morning Luke Briggs, your labs have returned and overall these are showing improvement.  Iron level is improving -- continue supplement daily.  No changes needed.  Any questions? ?Keep being stellar!!  Thank you for allowing me to participate in your care.  I appreciate you. ?Kindest regards, ?Luke Briggs ?

## 2022-02-08 ENCOUNTER — Telehealth: Payer: Medicaid Other | Admitting: Nurse Practitioner

## 2022-02-18 ENCOUNTER — Other Ambulatory Visit (INDEPENDENT_AMBULATORY_CARE_PROVIDER_SITE_OTHER): Payer: Self-pay | Admitting: Vascular Surgery

## 2022-02-18 ENCOUNTER — Ambulatory Visit (INDEPENDENT_AMBULATORY_CARE_PROVIDER_SITE_OTHER): Payer: Medicaid Other

## 2022-02-18 ENCOUNTER — Ambulatory Visit (INDEPENDENT_AMBULATORY_CARE_PROVIDER_SITE_OTHER): Payer: Medicaid Other | Admitting: Vascular Surgery

## 2022-02-18 DIAGNOSIS — I6523 Occlusion and stenosis of bilateral carotid arteries: Secondary | ICD-10-CM

## 2022-02-27 ENCOUNTER — Encounter (INDEPENDENT_AMBULATORY_CARE_PROVIDER_SITE_OTHER): Payer: Self-pay | Admitting: *Deleted

## 2022-03-05 ENCOUNTER — Telehealth: Payer: Medicaid Other

## 2022-03-05 ENCOUNTER — Ambulatory Visit: Payer: Self-pay

## 2022-03-05 DIAGNOSIS — F10288 Alcohol dependence with other alcohol-induced disorder: Secondary | ICD-10-CM

## 2022-03-05 DIAGNOSIS — J438 Other emphysema: Secondary | ICD-10-CM

## 2022-03-05 DIAGNOSIS — G894 Chronic pain syndrome: Secondary | ICD-10-CM

## 2022-03-05 DIAGNOSIS — E782 Mixed hyperlipidemia: Secondary | ICD-10-CM

## 2022-03-05 DIAGNOSIS — I10 Essential (primary) hypertension: Secondary | ICD-10-CM

## 2022-03-05 DIAGNOSIS — G8929 Other chronic pain: Secondary | ICD-10-CM

## 2022-03-05 NOTE — Chronic Care Management (AMB) (Signed)
? Care Management ?  ? RN Visit Note ? ?03/05/2022 ?Name: Danon Lograsso. MRN: [id number] DOB: 31-Mar-1961 ? ?Subjective: ?Filemon Breton. is a 61 y.o. year old male who is a primary care patient of [name], Barbaraann Faster, NP. The care management team was consulted for assistance with disease management and care coordination needs.   ? ?Engaged with patient by telephone for follow up visit in response to provider referral for case management and/or care coordination services.  ? ?Consent to Services:  ? Mr. Trull was given information about Care Management services today including:  ?Care Management services includes personalized support from designated clinical staff supervised by his physician, including individualized plan of care and coordination with other care providers ?24/7 contact phone numbers for assistance for urgent and routine care needs. ?The patient may stop case management services at any time by phone call to the office staff. ? ?Patient agreed to services and consent obtained.  ? ?Assessment: Review of patient past medical history, allergies, medications, health status, including review of consultants reports, laboratory and other test data, was performed as part of comprehensive evaluation and provision of chronic care management services.  ? ?SDOH (Social Determinants of Health) assessments and interventions performed:   ? ?Care Plan ? ?Allergies  ?Allergen Reactions  ? Lisinopril Swelling  ?  Reaction: angioedema  ? Iodinated Contrast Media Hives  ? Amlodipine   ?  Dizziness and edema with this  ? Penicillins Rash  ?  Reaction: Feels like skin is on fire ?Did it involve swelling of the face/tongue/throat, SOB, or low BP? No ?Did it involve sudden or severe rash/hives, skin peeling, or any reaction on the inside of your mouth or nose? No ?Did you need to seek medical attention at a hospital or doctor's office? Yes ?When did it last happen? 30 years ago ?If all above answers are ?NO?, may  proceed with cephalosporin use. ?  ? ? ?Outpatient Encounter Medications as of 03/05/2022  ?Medication Sig  ? albuterol (VENTOLIN HFA) 108 (90 Base) MCG/ACT inhaler Inhale 2 puffs into the lungs every 6 (six) hours as needed for wheezing orshortness of breath.  ? atorvastatin (LIPITOR) 10 MG tablet Take 1 tablet (10 mg total) by mouth daily.  ? Cholecalciferol (VITAMIN D3) 25 MCG (1000 UT) CAPS Take 2 capsules (2,000 Units total) by mouth daily.  ? ciprofloxacin (CIPRO) 500 MG tablet ciprofloxacin 500 mg tablet  ? EPINEPHrine 0.3 mg/0.3 mL IJ SOAJ injection Inject 0.3 mLs (0.3 mg total) into the muscle as needed for anaphylaxis.  ? finasteride (PROSCAR) 5 MG tablet Take 1 tablet (5 mg total) by mouth daily.  ? Iron-Vitamin C 65-125 MG TABS Take 1 tablet by mouth daily.  ? LORazepam (ATIVAN) 1 MG tablet Take 1 MG (1 tablet) 30 minutes prior to MRI, may take another tablet (1 MG) as needed if ongoing anxiety with procedure.  ? olmesartan-hydrochlorothiazide (BENICAR HCT) 20-12.5 MG tablet Take 1 tablet by mouth daily.  ? omeprazole (PRILOSEC) 40 MG capsule Take 1 capsule (40 mg total) by mouth in the morning and at bedtime.  ? predniSONE (DELTASONE) 10 MG tablet Take 6 tablets by mouth daily for 2 days, then reduce by 1 tablet every 2 days until gone  ? tadalafil (CIALIS) 5 MG tablet TAKE ONE TABLET BY MOUTH DAILY AS NEEDED FOR ERECTILE DYSFUNCTION  ? tamsulosin (FLOMAX) 0.4 MG CAPS capsule Take 1 capsule (0.4 mg total) by mouth daily.  ? Tiotropium Bromide-Olodaterol (STIOLTO RESPIMAT) 2.5-2.5  MCG/ACT AERS Inhale 2 puffs into the lungs daily.  ? [DISCONTINUED] carvedilol (COREG) 3.125 MG tablet Take 1 tablet (3.125 mg total) by mouth 2 (two) times daily.  ? ?No facility-administered encounter medications on file as of 03/05/2022.  ? ? ?Patient Active Problem List  ? Diagnosis Date Noted  ? Allergic drug rash 02/01/2022  ? Cervical stenosis of spinal canal 12/18/2021  ? Gastric erythema   ? Elevated serum GGT level  07/26/2021  ? Mixed hyperlipidemia 03/27/2021  ? History of lacunar cerebrovascular accident 02/13/2021  ? Tinnitus of both ears 02/13/2021  ? Migraines 02/13/2021  ? History of opioid abuse ([place]) 01/10/2021  ? Carotid stenosis 12/31/2020  ? Erectile dysfunction 03/22/2020  ? Aortic atherosclerosis ([place]) 09/29/2019  ? Bilateral primary osteoarthritis of knee 07/01/2019  ? Paraseptal emphysema ([place]) 06/17/2019  ? Controlled substance agreement signed 05/05/2019  ? Chronic pain syndrome 04/28/2019  ? BPH (benign prostatic hyperplasia) 04/28/2019  ? Eczema 04/28/2019  ? Essential hypertension 04/28/2019  ? GERD (gastroesophageal reflux disease) 04/28/2019  ? Schizophrenia ([place]) 04/28/2019  ? Chronic post-traumatic stress disorder (PTSD) 04/28/2019  ? Hemorrhoids 04/28/2019  ? Nicotine dependence, cigarettes, w unsp disorders 04/28/2019  ? Alcohol dependence ([place]) 04/28/2019  ? ? ?Conditions to be addressed/monitored: HTN, HLD, Pulmonary Disease, and ETOH and chronic pain and discomfort ? ?Care Plan : RNCM: General Plan of Care (Adult) for Chronic Disease Management and Care Coordination Needs  ?Updates made by Vanita Ingles, RN since 03/05/2022 12:00 AM  ?  ? ?Problem: RNCM: Development of Plan of Care for Chronic Disease Managment (HTN, HLD, Emphysema, Smoker, ETOH, Chronic pain)   ?Priority: High  ?  ? ?Long-Range Goal: RNCM: Development of Plan of Care for Chronic Disease Managment (HTN, HLD, Emphysema, Smoker, ETOH, Chronic pain)   ?Start Date: 10/04/2021  ?Expected End Date: 10/04/2022  ?Priority: High  ?Note:   ?Current Barriers:  ?Knowledge Deficits related to plan of care for management of HTN, HLD, COPD, Smoker, ETOH use, and chronic pain   ?Care Coordination needs related to Financial constraints related to inability to afford propane gas for heat source during the winter  and Substance abuse issues -  smoker and ETOH use  ?Chronic Disease Management support and education needs related to HTN, HLD, COPD, and  Smoker, Alcohol use, and chronic pain  ?Lacks caregiver support.        ?Film/video editor.  ?10-04-2021: Urgent Care guide referral made for assistance with fuel voucher for propane gas at his home to help with financial strain and the need for adequate heating this winter. 12-12-2021: The patient has received assistance from the care guides for help with oil bill ? ?RNCM Clinical Goal(s):  ?Patient will verbalize basic understanding of HTN, HLD, COPD, Tobacco Use, and Alcohol use, and chronic pain  disease process and self health management plan as evidenced by working with the CCM team to optimize health and well being and get needed resources to help with financial burden ?take all medications exactly as prescribed and will call provider for medication related questions as evidenced by taking medications as prescribed and calling for refills before running out of medications     ?continue to work with Consulting civil engineer and/or Social Worker to address care management and care coordination needs related to HTN, HLD, COPD, Tobacco Use, and smoker, alcohol use and chronic pain  as evidenced by adherence to CM Team Scheduled appointments     ?demonstrate a decrease in alcohol abuse  exacerbations  as evidenced by cutting back on alcohol, reaching out to the pcp for assistance with rehab options, and working with the CCM team to effectively manage health and well being ?demonstrate ongoing self health care management ability for effective management of Chronic Conditions  as evidenced by working with the CCM team through collaboration with Consulting civil engineer, provider, and care team.  ? ?Interventions: ?1:1 collaboration with primary care provider regarding development and update of comprehensive plan of care as evidenced by provider attestation and co-signature ?Inter-disciplinary care team collaboration (see longitudinal plan of care) ?Evaluation of current treatment plan related to  self management and patient's  adherence to plan as established by provider ? ? ?COPD: (Status: Goal on Track (progressing): YES.) Long Term Goal  ?Reviewed medications with patient, including use of prescribed maintenance and rescue inhalers

## 2022-03-05 NOTE — Patient Instructions (Signed)
Visit Information ? ?Thank you for taking time to visit with me today. Please don't hesitate to contact me if I can be of assistance to you before our next scheduled telephone appointment. ? ?Following are the goals we discussed today:  ?RNCM Clinical Goal(s):  ?Patient will verbalize basic understanding of HTN, HLD, COPD, Tobacco Use, and Alcohol use, and chronic pain  disease process and self health management plan as evidenced by working with the CCM team to optimize health and well being and get needed resources to help with financial burden ?take all medications exactly as prescribed and will call provider for medication related questions as evidenced by taking medications as prescribed and calling for refills before running out of medications     ?continue to work with Consulting civil engineer and/or Social Worker to address care management and care coordination needs related to HTN, HLD, COPD, Tobacco Use, and smoker, alcohol use and chronic pain  as evidenced by adherence to CM Team Scheduled appointments     ?demonstrate a decrease in alcohol abuse  exacerbations  as evidenced by cutting back on alcohol, reaching out to the pcp for assistance with rehab options, and working with the CCM team to effectively manage health and well being ?demonstrate ongoing self health care management ability for effective management of Chronic Conditions  as evidenced by working with the CCM team through collaboration with Consulting civil engineer, provider, and care team.  ?  ?Interventions: ?1:1 collaboration with primary care provider regarding development and update of comprehensive plan of care as evidenced by provider attestation and co-signature ?Inter-disciplinary care team collaboration (see longitudinal plan of care) ?Evaluation of current treatment plan related to  self management and patient's adherence to plan as established by provider ?  ?  ?COPD: (Status: Goal on Track (progressing): YES.) Long Term Goal  ?Reviewed medications  with patient, including use of prescribed maintenance and rescue inhalers, and provided instruction on medication management and the importance of adherence. 03-05-2022: States compliance with his medications ?Provided patient with basic written and verbal COPD education on self care/management/and exacerbation prevention. 03-05-2022: Review with the patient. The patient states his breathing is about the same and no exacerbations ?Advised patient to track and manage COPD triggers. 03-05-2022: Knows what triggers his COPD. States he is stable. ?Provided written and verbal instructions on pursed lip breathing and utilized returned demonstration as teach back ?Provided instruction about proper use of medications used for management of COPD including inhalers ?Advised patient to self assesses COPD action plan zone and make appointment with provider if in the yellow zone for 48 hours without improvement ?Advised patient to engage in light exercise as tolerated 3-5 days a week to aid in the the management of COPD ?Provided education about and advised patient to utilize infection prevention strategies to reduce risk of respiratory infection ?Discussed the importance of adequate rest and management of fatigue with COPD. 03-05-2022: The patient states he was just waking up. Denies any issues with getting adequate rest.  ?Screening for signs and symptoms of depression related to chronic disease state  ?Assessed social determinant of health barriers ?  ?Alcohol use   (Status: Goal on Track (progressing): YES.) Long Term Goal  ?Evaluation of current treatment plan related to  Alcohol use , Substance abuse issues -  alcohol and smoking  self-management and patient's adherence to plan as established by provider. 12-12-2021: The patient is following pcp and specialist. Had an EGD in December. Recommendations for follow up EGD in 3 years  for evaluation of Barrett's esophagus. States he is taking medications prescribed by the specialist.  03-05-2022: The patient confirms compliance with medications. Denies any new issues with medications. States things are about the same.  ?Discussed plans with patient for ongoing care management follow up and provided patient with direct contact information for care management team ?Advised patient to call the office for changes in condition, wanting to seek help with cessation of alcohol use; ?Provided education to patient re: benefits of cessation of alcohol; ?Discussed plans with patient for ongoing care management follow up and provided patient with direct contact information for care management team; ?Advised patient to discuss treatment options with provider; ?  ?Hyperlipidemia:  (Status: Goal on Track (progressing): YES.) Long Term Goal  ?     ?Lab Results  ?Component Value Date  ?  CHOL 149 01/14/2022  ?  HDL 53 01/14/2022  ?  [place] 62 01/14/2022  ?  TRIG 213 (H) 01/14/2022  ?  ?  ?Medication review performed; medication list updated in electronic medical record. 03-05-2022: The patient takes Lipitor 10 mg QD ?Provider established cholesterol goals reviewed; ?Counseled on importance of regular laboratory monitoring as prescribed. 03-05-2022: Has regular lab testing; ?Provided HLD educational materials; ?Reviewed role and benefits of statin for ASCVD risk reduction; ?Discussed strategies to manage statin-induced myalgias; ?Reviewed importance of limiting foods high in cholesterol. 03-05-2022: Encouraged heart healthy diet ?  ?Hypertension: (Status: New goal. Goal on Track (progressing): YES.) ?Last practice recorded BP readings:  ?   ?BP Readings from Last 3 Encounters:  ?02/01/22 128/80  ?01/14/22 119/71  ?12/18/21 124/85  ?Most recent eGFR/CrCl:  ?     ?Lab Results  ?Component Value Date  ?  EGFR 96 01/14/2022  ?  No components found for: CRCL ?  ?Evaluation of current treatment plan related to hypertension self management and patient's adherence to plan as established by provider. 03-05-2022: Denies any  issues with HTN or heart health. Has a history of fluctuations in blood pressure. Encouraged the patient to keep follow up appointments. Next with pcp on 07-18-2022 at 0840 am;   ?Provided education to patient re: stroke prevention, s/s of heart attack and stroke; ?Reviewed prescribed diet heart healthy ?Reviewed medications with patient and discussed importance of compliance. 03-05-2022: the patient states compliance with medications;  ?Counseled on adverse effects of illicit drug and excessive alcohol use in patients with high blood pressure;  ?Discussed plans with patient for ongoing care management follow up and provided patient with direct contact information for care management team; ?Advised patient, providing education and rationale, to monitor blood pressure daily and record, calling PCP for findings outside established parameters;  ?Advised patient to discuss blood pressure trends  with provider; ?Provided education on prescribed diet heart healthy;  ?Discussed complications of poorly controlled blood pressure such as heart disease, stroke, circulatory complications, vision complications, kidney impairment, sexual dysfunction;  ?  ?Pain:  (Status: New goal. Goal on Track (progressing): YES.) Long Term Goal  ?Pain assessment performed. 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 He ask for a referral for ortho specialist. Ask the patient if he has ever been to emerg ortho and he said no. Encouraged him to discuss options with the pcp at upcoming appointment. Will in basket message pcp for request of referral for the patient. 03-05-2022: The patient rates his pain level today at an 8 on a scale of  0-10. The patient has seen a specialist earlier this month and will go back in 3 months. The patient states it is always at about an 8. They are monitoring his symptoms. Right now he does not want to have surgery but is working with the  specialist to see what the best plan of care for effective management of his pain is. Will continue to monitor for changes.  ?Medications reviewed. 12-12-2021: States the medication he is taking is not benefi

## 2022-03-07 ENCOUNTER — Other Ambulatory Visit: Payer: Medicaid Other

## 2022-04-12 ENCOUNTER — Other Ambulatory Visit: Payer: Self-pay | Admitting: Nurse Practitioner

## 2022-04-12 NOTE — Telephone Encounter (Signed)
Medication Refill - Medication: Tiotropium Bromide-Olodaterol (STIOLTO RESPIMAT) 2.5-2.5 MCG/ACT AERS  Has the patient contacted their pharmacy? Yes.    (Agent: If yes, when and what did the pharmacy advise?) Contact PCP and request medication  Preferred Pharmacy (with phone number or street name):  [place] [facility], [place] Phone:  [phone]  Fax:  [phone]      Has the patient been seen for an appointment in the last year OR does the patient have an upcoming appointment? Yes.    Agent: Please be advised that RX refills may take up to 3 business days. We ask that you follow-up with your pharmacy.

## 2022-04-13 ENCOUNTER — Other Ambulatory Visit: Payer: Self-pay | Admitting: Nurse Practitioner

## 2022-04-16 ENCOUNTER — Telehealth: Payer: Self-pay | Admitting: Urology

## 2022-04-16 ENCOUNTER — Other Ambulatory Visit: Payer: Self-pay | Admitting: Nurse Practitioner

## 2022-04-16 MED ORDER — STIOLTO RESPIMAT 2.5-2.5 MCG/ACT IN AERS: 2.0000 | INHALATION_SPRAY | Freq: Every day | RESPIRATORY_TRACT | 4 refills | Status: DC

## 2022-04-16 NOTE — Telephone Encounter (Signed)
Requested medication (s) are due for refill today - expired Rx  Requested medication (s) are on the active medication list -yes  Future visit scheduled -yes  Last refill: 03/27/22 12g 4RF  Notes to clinic: medication not assigned protocol   Requested Prescriptions  Pending Prescriptions Disp Refills   Tiotropium Bromide-Olodaterol (STIOLTO RESPIMAT) 2.5-2.5 MCG/ACT AERS 12 g 4    Sig: Inhale 2 puffs into the lungs daily.     Off-Protocol Failed - 04/12/2022  1:22 PM      Failed - Medication not assigned to a protocol, review manually.      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     Off-Protocol Failed - 04/12/2022  1:22 PM      Failed - Medication not assigned to a protocol, review manually.      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

## 2022-04-16 NOTE — Telephone Encounter (Signed)
Requested medication (s) are due for refill today: yes  Requested medication (s) are on the active medication list: yes  Last refill:  03/27/21  Future visit scheduled: yes  Notes to clinic:  Unable to refill per protocol, medication not assigned to the refill protocol.    Requested Prescriptions  Pending Prescriptions Disp Refills   STIOLTO RESPIMAT 2.5-2.5 MCG/ACT AERS [Pharmacy Med Name: STIOLTO RESPIMAT INHAL SPRAY] 4 g 0    Sig: Inhale 2 puffs into the lungs daily.     Off-Protocol Failed - 04/13/2022  8:35 AM      Failed - Medication not assigned to a protocol, review manually.      Passed - Valid encounter within last 12 months    Recent Outpatient Visits           2 months ago Allergic drug rash   [place], 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

## 2022-04-18 NOTE — Telephone Encounter (Signed)
Pt last seen 4/22, asking for refill of Tadalafil, last office note says to return if symptoms worsen or fail to improve.  Can you get a refill, or does he need to come in for appt.  Pt would like to know.

## 2022-04-26 ENCOUNTER — Encounter: Payer: Self-pay | Admitting: Urology

## 2022-04-26 ENCOUNTER — Ambulatory Visit (INDEPENDENT_AMBULATORY_CARE_PROVIDER_SITE_OTHER): Payer: Medicaid Other | Admitting: Urology

## 2022-04-26 VITALS — BP 159/93 | HR 85 | Ht 70.0 in | Wt 210.0 lb

## 2022-04-26 DIAGNOSIS — N5201 Erectile dysfunction due to arterial insufficiency: Secondary | ICD-10-CM

## 2022-04-26 DIAGNOSIS — N401 Enlarged prostate with lower urinary tract symptoms: Secondary | ICD-10-CM

## 2022-04-26 MED ORDER — TADALAFIL 5 MG PO TABS: ORAL_TABLET | ORAL | 3 refills | Status: DC

## 2022-04-26 NOTE — Progress Notes (Signed)
04/26/2022 8:22 AM   Luke Briggs. 20-Oct-1961 [id number]  Referring provider: [name], NP [address] [place],  [place] [address]  Chief Complaint  Patient presents with   Medication Refill    HPI: 61 y.o. male presents for medication refill  Seen last year for BPH.  Had severe lower urinary tract symptoms with low PVR on tamsulosin/finasteride Declined outlet procedures Also with the ED and tadalafil 5 mg daily was added He does feel the tadalafil has helped his voiding pattern but still with the ED    PMH: Past Medical History:  Diagnosis Date   Angioedema 08/07/2019   Arthritis    knees,shoulders, ankles, most joints   Cervical disc disorder    difficuly moving neck left   COPD (chronic obstructive pulmonary disease) (HCC)    chronic cough and wheezing   Dyspnea    easily   GERD (gastroesophageal reflux disease)    Hemorrhoids    History of hiatal hernia    Hypertension    controlled on meds   Pneumonia 04/2019   Prostate disorder    PTSD (post-traumatic stress disorder)    Schizophrenia ([place])    Per patient's report.    Surgical History: Past Surgical History:  Procedure Laterality Date   CATARACT EXTRACTION W/PHACO Left 03/28/2020   Procedure: CATARACT EXTRACTION PHACO AND INTRAOCULAR LENS PLACEMENT ([place]) LEFT MALYUGIN  MILOOP,   5.29  00:41.0;  Surgeon: Birder Robson, MD;  Location: [facility];  Service: Ophthalmology;  Laterality: Left;   COLONOSCOPY WITH PROPOFOL N/A 12/14/2020   Procedure: COLONOSCOPY WITH PROPOFOL;  Surgeon: Jonathon Bellows, MD;  Location: [facility] ENDOSCOPY;  Service: Gastroenterology;  Laterality: N/A;   ESOPHAGOGASTRODUODENOSCOPY (EGD) WITH PROPOFOL N/A 10/18/2021   Procedure: ESOPHAGOGASTRODUODENOSCOPY (EGD) WITH PROPOFOL;  Surgeon: Virgel Manifold, MD;  Location: [facility] ENDOSCOPY;  Service: Endoscopy;  Laterality: N/A;   EYE SURGERY Left    injury from a nail   FOOT SURGERY Left     Home Medications:   Allergies as of 04/26/2022       Reactions   Lisinopril Swelling   Reaction: angioedema   Iodinated Contrast Media Hives   Amlodipine    Dizziness and edema with this   Penicillins Rash   Reaction: Feels like skin is on fire Did it involve swelling of the face/tongue/throat, SOB, or low BP? No Did it involve sudden or severe rash/hives, skin peeling, or any reaction on the inside of your mouth or nose? No Did you need to seek medical attention at a hospital or doctor's office? Yes When did it last happen? 30 years ago If all above answers are "NO", may proceed with cephalosporin use.        Medication List        Accurate as of April 26, 2022  8:22 AM. If you have any questions, ask your nurse or doctor.          albuterol 108 (90 Base) MCG/ACT inhaler Commonly known as: VENTOLIN HFA Inhale 2 puffs into the lungs every 6 (six) hours as needed for wheezing orshortness of breath.   atorvastatin 10 MG tablet Commonly known as: LIPITOR Take 1 tablet (10 mg total) by mouth daily.   ciprofloxacin 500 MG tablet Commonly known as: CIPRO ciprofloxacin 500 mg tablet   EPINEPHrine 0.3 mg/0.3 mL Soaj injection Commonly known as: EPI-PEN Inject 0.3 mLs (0.3 mg total) into the muscle as needed for anaphylaxis.   finasteride 5 MG tablet Commonly known as:  PROSCAR Take 1 tablet (5 mg total) by mouth daily.   Iron-Vitamin C 65-125 MG Tabs Take 1 tablet by mouth daily.   LORazepam 1 MG tablet Commonly known as: ATIVAN Take 1 MG (1 tablet) 30 minutes prior to MRI, may take another tablet (1 MG) as needed if ongoing anxiety with procedure.   olmesartan-hydrochlorothiazide 20-12.5 MG tablet Commonly known as: BENICAR HCT Take 1 tablet by mouth daily.   omeprazole 40 MG capsule Commonly known as: PRILOSEC Take 1 capsule (40 mg total) by mouth in the morning and at bedtime.   predniSONE 10 MG tablet Commonly known as: DELTASONE Take 6 tablets by mouth daily for 2 days, then  reduce by 1 tablet every 2 days until gone   Stiolto Respimat 2.5-2.5 MCG/ACT Aers Generic drug: Tiotropium Bromide-Olodaterol Inhale 2 puffs into the lungs daily.   Stiolto Respimat 2.5-2.5 MCG/ACT Aers Generic drug: Tiotropium Bromide-Olodaterol Inhale 2 puffs into the lungs daily.   tadalafil 5 MG tablet Commonly known as: CIALIS TAKE ONE TABLET BY MOUTH DAILY AS NEEDED FOR ERECTILE DYSFUNCTION   tamsulosin 0.4 MG Caps capsule Commonly known as: FLOMAX Take 1 capsule (0.4 mg total) by mouth daily.   Vitamin D3 25 MCG (1000 UT) Caps Take 2 capsules (2,000 Units total) by mouth daily.        Allergies:  Allergies  Allergen Reactions   Lisinopril Swelling    Reaction: angioedema   Iodinated Contrast Media Hives   Amlodipine     Dizziness and edema with this   Penicillins Rash    Reaction: Feels like skin is on fire Did it involve swelling of the face/tongue/throat, SOB, or low BP? No Did it involve sudden or severe rash/hives, skin peeling, or any reaction on the inside of your mouth or nose? No Did you need to seek medical attention at a hospital or doctor's office? Yes When did it last happen? 30 years ago If all above answers are "NO", may proceed with cephalosporin use.     Family History: Family History  Problem Relation Age of Onset   Heart disease Mother    Osteoporosis Mother    Heart disease Father    Emphysema Father    Heart disease Sister    Emphysema Maternal Grandmother    Heart disease Maternal Grandfather    Osteoporosis Sister     Social History:  reports that he has been smoking cigarettes. He has a 43.00 pack-year smoking history. He has never used smokeless tobacco. He reports current alcohol use of about 8.0 standard drinks of alcohol per week. He reports that he does not use drugs.   Physical Exam: BP (!) 159/93   Pulse 85   Ht '5\' 10"'$  (1.778 m)   Wt 210 lb (95.3 kg)   BMI 30.13 kg/m   Constitutional:  Alert and oriented, No acute  distress. Psychiatric: Normal mood and affect.   Assessment & Plan:    1. Benign prostatic hyperplasia with severe LUTS Declined bladder scan today Tadalafil refilled (good Rx Kristopher Oppenheim) He desires to follow-up annually for tadalafil refills  2.  Erectile dysfunction Intracavernosal injections were discussed   Abbie Sons, MD  [facility] [address] [place], [place] [address] [phone]

## 2022-05-14 ENCOUNTER — Telehealth: Payer: Self-pay

## 2022-05-14 ENCOUNTER — Ambulatory Visit: Payer: Self-pay

## 2022-05-14 ENCOUNTER — Telehealth: Payer: Medicaid Other

## 2022-05-14 DIAGNOSIS — I10 Essential (primary) hypertension: Secondary | ICD-10-CM

## 2022-05-14 DIAGNOSIS — E782 Mixed hyperlipidemia: Secondary | ICD-10-CM

## 2022-05-14 DIAGNOSIS — G894 Chronic pain syndrome: Secondary | ICD-10-CM

## 2022-05-14 DIAGNOSIS — G8929 Other chronic pain: Secondary | ICD-10-CM

## 2022-05-14 DIAGNOSIS — J438 Other emphysema: Secondary | ICD-10-CM

## 2022-05-14 DIAGNOSIS — F10288 Alcohol dependence with other alcohol-induced disorder: Secondary | ICD-10-CM

## 2022-05-25 NOTE — Patient Instructions (Incomplete)
Eating Plan for Chronic Obstructive Pulmonary Disease Chronic obstructive pulmonary disease (COPD) causes symptoms such as shortness of breath, coughing, and chest discomfort. These symptoms can make it difficult to eat enough to maintain a healthy weight. Generally, people with COPD should eat a diet that is high in calories, protein, and other nutrients to maintain body weight and to keep the lungs as healthy as possible. Depending on the medicines you take and other health conditions you may have, your health care provider may give you additional recommendations on what to eat or avoid. Talk with your health care provider about your goals for body weight, and work with a dietitian to develop an eating plan that is right for you. What are tips for following this plan? Reading food labels  Avoid foods with more than 300 milligrams (mg) of salt (sodium) per serving. Choose foods that contain at least 4 grams (g) of fiber per serving. Try to eat 20-30 g of fiber each day. Choose foods that are high in calories and protein, such as nuts, beans, yogurt, and cheese. Shopping Do not buy foods labeled as diet, low-calorie, or low-fat. If you are able to eat dairy products: Avoid low-fat or skim milk. Buy dairy products that have at least 2% fat. Buy nutritional supplement drinks. Buy grains and prepared foods labeled as enriched or fortified. Consider buying low-sodium, pre-made foods to conserve energy for eating. Cooking Add dry milk or protein powder to smoothies. Cook with healthy fats, such as olive oil, canola oil, sunflower oil, and grapeseed oil. Add oil, butter, cream cheese, or nut butters to foods to increase fat and calories. To make foods easier to chew and swallow: Cook vegetables, pasta, and rice until soft. Cut or grind meat into very small pieces. Dip breads in liquid. Meal planning  Eat when you feel hungry. Eat 5-6 small meals throughout the day. Drink 6-8 glasses of water  each day. Do not drink liquids with meals. Drink liquids at the end of the meal to avoid feeling full too quickly. Eat a variety of fruits and vegetables every day. Ask for assistance from family or friends with planning and preparing meals as needed. Avoid foods that cause you to feel bloated, such as carbonated drinks, fried foods, beans, broccoli, cabbage, and apples. For older adults, ask your local agency on aging whether you are eligible for meal assistance programs, such as Meals on Wheels. Lifestyle  Do not smoke. Eat slowly. Take small bites and chew food well before swallowing. Do not overeat. This may make it more difficult to breathe after eating. Sit up while eating. If needed, continue to use supplemental oxygen while eating. Rest or relax for 30 minutes before and after eating. Monitor your weight as told by your health care provider. Exercise as told by your health care provider. What foods should I eat? Fruits All fresh, dried, canned, or frozen fruits that do not cause gas. Vegetables All fresh, canned (no salt added), or frozen vegetables that do not cause gas. Grains Whole-grain bread. Enriched whole-grain pasta. Fortified whole-grain cereals. Fortified rice. Quinoa. Meats and other proteins Lean meat. Poultry. Fish. Dried beans. Unsalted nuts. Tofu. Eggs. Nut butters. Dairy Whole or 2% milk. Cheese. Yogurt. Fats and oils Olive oil. Canola oil. Butter. Margarine. Beverages Water. Vegetable juice (no salt added). Decaffeinated coffee. Decaffeinated or herbal tea. Seasonings and condiments Fresh or dried herbs. Low-salt or salt-free seasonings. Low-sodium soy sauce. The items listed above may not be a complete list of foods   and beverages you can eat. Contact a dietitian for more information. What foods should I avoid? Fruits Fruits that cause gas, such as apples or melon. Vegetables Vegetables that cause gas, such as broccoli, Brussels sprouts, cabbage,  cauliflower, and onions. Canned vegetables with added salt. Meats and other proteins Fried meat. Salt-cured meat. Processed meat. Dairy Fat-free or low-fat milk, yogurt, or cheese. Processed cheese. Beverages Carbonated drinks. Caffeinated drinks, such as coffee, tea, and soft drinks. Juice. Alcohol. Vegetable juice with added salt. Seasonings and condiments Salt. Seasoning mixes with salt. Soy sauce. Pickles. Other foods Clear soup or broth. Fried foods. Prepared frozen meals. The items listed above may not be a complete list of foods and beverages you should avoid. Contact a dietitian for more information. Summary COPD symptoms can make it difficult to eat enough to maintain a healthy weight. A COPD eating plan can help you maintain your body weight and keep your lungs as healthy as possible. Eat a diet that is high in calories, protein, and other nutrients. Read labels to make sure that you are getting the right nutrients. Cook foods to make them easier to chew and swallow. Eat 5-6 small meals throughout the day, and avoid foods that cause gas or make you feel bloated. This information is not intended to replace advice given to you by your health care provider. Make sure you discuss any questions you have with your health care provider. Document Revised: 09/12/2020 Document Reviewed: 09/12/2020 Elsevier Patient Education  2023 [employer]  

## 2022-05-28 ENCOUNTER — Encounter: Payer: Self-pay | Admitting: Nurse Practitioner

## 2022-05-28 ENCOUNTER — Ambulatory Visit (INDEPENDENT_AMBULATORY_CARE_PROVIDER_SITE_OTHER): Payer: Medicaid Other | Admitting: Nurse Practitioner

## 2022-05-28 VITALS — BP 124/77 | HR 71 | Temp 97.5°F | Ht 70.0 in | Wt 198.8 lb

## 2022-05-28 DIAGNOSIS — J438 Other emphysema: Secondary | ICD-10-CM

## 2022-05-28 DIAGNOSIS — F209 Schizophrenia, unspecified: Secondary | ICD-10-CM

## 2022-05-28 DIAGNOSIS — F4312 Post-traumatic stress disorder, chronic: Secondary | ICD-10-CM

## 2022-05-28 DIAGNOSIS — I700 Atherosclerosis of aorta: Secondary | ICD-10-CM

## 2022-05-28 DIAGNOSIS — N401 Enlarged prostate with lower urinary tract symptoms: Secondary | ICD-10-CM

## 2022-05-28 DIAGNOSIS — E782 Mixed hyperlipidemia: Secondary | ICD-10-CM

## 2022-05-28 DIAGNOSIS — F10288 Alcohol dependence with other alcohol-induced disorder: Secondary | ICD-10-CM | POA: Diagnosis not present

## 2022-05-28 DIAGNOSIS — F17219 Nicotine dependence, cigarettes, with unspecified nicotine-induced disorders: Secondary | ICD-10-CM

## 2022-05-28 DIAGNOSIS — K219 Gastro-esophageal reflux disease without esophagitis: Secondary | ICD-10-CM

## 2022-05-28 DIAGNOSIS — R351 Nocturia: Secondary | ICD-10-CM

## 2022-05-28 DIAGNOSIS — I10 Essential (primary) hypertension: Secondary | ICD-10-CM

## 2022-05-28 DIAGNOSIS — G959 Disease of spinal cord, unspecified: Secondary | ICD-10-CM | POA: Insufficient documentation

## 2022-05-28 MED ORDER — BREZTRI AEROSPHERE 160-9-4.8 MCG/ACT IN AERO: 2.0000 | INHALATION_SPRAY | Freq: Two times a day (BID) | RESPIRATORY_TRACT | 12 refills | Status: DC

## 2022-05-28 NOTE — Assessment & Plan Note (Signed)
This was noticed on November 2020 lung CT.  Continue statin therapy + recommend complete cessation of smoking.  Lipid panel today.

## 2022-05-28 NOTE — Assessment & Plan Note (Signed)
Chronic, ongoing.  No current medications.  Tolerating this and does not wish to start medication.  Could consider Seroquel in future if needed or return to Clonidine.  Denies SI/HI. 

## 2022-05-28 NOTE — Progress Notes (Signed)
BP 124/77   Pulse 71   Temp (!) 97.5 F (36.4 C) (Oral)   Ht '5\' 10"'$  (1.778 m)   Wt 198 lb 12.8 oz (90.2 kg)   SpO2 97%   BMI 28.52 kg/m    Subjective:    Patient ID: Luke Briggs., male    DOB: 04/23/1961, 61 y.o.   MRN: [id number]  HPI: [name]. is a 61 y.o. male  Chief Complaint  Patient presents with   Emphysema   Hyperlipidemia   Hypertension   Alcohol Problem   Benign Prostatic Hypertrophy   Schizophrenia   HYPERTENSION / HYPERLIPIDEMIA Saw cardiology last on 01/23/22 at [facility] (Dr. Smith Mince), was told he no longer needed to see her -- PCP can refill.  Taking Benicar and Atorvastatin.     Chronic right basal ganglia lacunar infarct on imaging and continues to complain of ongoing tinnitus and intermittent dizziness -- sees ENT & neuro for this and last saw Dr. Melrose Nakayama 03/08/21.  He reports ongoing issues, has not been back to see neurology as instructed.  Did see neurosurgery for cervical myelopathy -- 02/26/22 = possible injections in future. Vascular performed duplex ultrasound which showed widely patent RICA and 76% LICA stenosis + chronic right basal ganglia lacunar infarct.   Last echo performed 11/18/19 -- EF 60-65% and no left ventricular hypertrophy. Has had myoview in past and this showed no ischemia on stress test.     Satisfied with current treatment? yes Duration of hypertension: chronic BP monitoring frequency: not checking BP range:  BP medication side effects: no Duration of hyperlipidemia: chronic Cholesterol medication side effects: no Cholesterol supplements: none Medication compliance: good compliance Aspirin: no Recent stressors: no Recurrent headaches: yes Visual changes: no Palpitations: no Dyspnea: yes -- has had weakness and SOB with activity Chest pain: no Lower extremity edema: no Dizzy/lightheaded: yes   COPD Uses Stiolto and Albuterol as needed.  Had last lung screening on 10/23/21 -- noting emphysema and aortic  atherosclerosis.  Continues to drink daily, has cut back to 3-5  [employer] beer during the week and then 10-15 on weekends.  Not drinking every day. Continues to report drinking helps his tinnitus and reports he feels worse if he does not drink -- reports he gets weak and fatigued if does not drink.    Continues to smoke 1 1/2 PPD, has been smoking for 45 years.  Not interested in quitting.   Satisfied with current treatment?: yes Oxygen use: no Dyspnea frequency: minimal Cough frequency: none Rescue inhaler frequency:  2-3 times a day Limitation of activity: no Productive cough: none Last Spirometry: today Pneumovax: Not up to Date Influenza: Up to Date  BPH Continues on Finasteride and Flomax. Last saw urology 04/26/22. BPH status: stable Satisfied with current treatment?: yes Medication side effects: no Medication compliance: good compliance Duration: chronic Nocturia: no Urinary frequency:no Incomplete voiding: no Urgency: no Weak urinary stream: no Straining to start stream: no Dysuria: no Onset: gradual Severity: mild Alleviating factors: medications Aggravating factors: unknown Treatments attempted: as above   GERD Taking Omeprazole 40 MG BID, takes it mainly in the morning. At times after eating gets shaky and sweaty, feels like he has been poisoned -- has not been back to see GI.  Did have colonoscopy 12/14/20 with Dr. Vicente Males, to repeat in 3 years. He last saw GI on 10/10/21. GERD control status: stable Satisfied with current treatment? yes Heartburn frequency:  Medication side effects: no  Medication compliance: fluctuating  Previous GERD medications: TUMS Dysphagia: no Odynophagia:  no Hematemesis: no Blood in stool: no EGD: yes  [place] Office Visit from 01/14/2022 in [facility]  AUDIT-C Score [place] Office Visit from 01/14/2022 in [facility]  Alcohol Use Disorder Identification Test Final Score (AUDIT)  16        SCHIZOPHRENIA No current medications.  His depression comes from not able to work and be productive as he once was.   Mood status: controlled Satisfied with current treatment?: yes Symptom severity: moderate  Psychotherapy/counseling: none Previous psychiatric medications: many medications Depressed mood: yes Anxious mood: yes Anhedonia: no Significant weight loss or gain: no Insomnia: none Fatigue: yes Feelings of worthlessness or guilt: yes Impaired concentration/indecisiveness: no Suicidal ideations: no Hopelessness: no Crying spells: no    05/28/2022    9:31 AM 02/01/2022    9:33 AM 01/14/2022    2:46 PM 12/18/2021   11:33 AM 01/10/2021    2:35 PM  Depression screen PHQ 2/9  Decreased Interest '3 2 2 3 3  '$ Down, Depressed, Hopeless '3 2 2 2 3  '$ PHQ - 2 Score '6 4 4 5 6  '$ Altered sleeping '3 2 2 2 3  '$ Tired, decreased energy '3 2 1 3 3  '$ Change in appetite '1 2 2 3 3  '$ Feeling bad or failure about yourself  '1 2 2 3 1  '$ Trouble concentrating '3 2 2 3 '$ 0  Moving slowly or fidgety/restless '2 2 2 1 3  '$ Suicidal thoughts 0 0 0 0 0  PHQ-9 Score '19 16 15 20 19  '$ Difficult doing work/chores Very difficult  Somewhat difficult         05/28/2022    9:31 AM 02/01/2022    9:33 AM 01/14/2022    2:46 PM 12/18/2021   11:33 AM  GAD 7 : Generalized Anxiety Score  Nervous, Anxious, on Edge '2 2 1 2  '$ Control/stop worrying '2 2 2 2  '$ Worry too much - different things '2 2 2 2  '$ Trouble relaxing '3 2 2 2  '$ Restless '2 2 2 2  '$ Easily annoyed or irritable '3 2 1 2  '$ Afraid - awful might happen '1 2 1 2  '$ Total GAD 7 Score '15 14 11 14  '$ Anxiety Difficulty Somewhat difficult Very difficult Not difficult at all Very difficult    Relevant past medical, surgical, family and social history reviewed and updated as indicated. Interim medical history since our last visit reviewed. Allergies and medications reviewed and updated.  Review of Systems  Constitutional:  Positive for fatigue. Negative for activity  change, chills, diaphoresis and fever.  Respiratory:  Positive for shortness of breath. Negative for cough, chest tightness and wheezing.   Cardiovascular:  Negative for chest pain, palpitations and leg swelling.  Gastrointestinal: Negative.   Endocrine: Negative for cold intolerance, heat intolerance, polydipsia, polyphagia and polyuria.  Neurological:  Negative for dizziness, syncope, weakness, light-headedness, numbness and headaches.  Psychiatric/Behavioral: Negative.      Per HPI unless specifically indicated above     Objective:    BP 124/77   Pulse 71   Temp (!) 97.5 F (36.4 C) (Oral)   Ht '5\' 10"'$  (1.778 m)   Wt 198 lb 12.8 oz (90.2 kg)   SpO2 97%   BMI 28.52 kg/m   Wt Readings from Last 3 Encounters:  05/28/22 198 lb 12.8 oz (90.2 kg)  04/26/22 210 lb (95.3 kg)  02/01/22 198 lb 3.2  oz (89.9 kg)    Physical Exam Vitals and nursing note reviewed.  Constitutional:      General: He is awake. He is not in acute distress.    Appearance: He is well-developed and well-groomed. He is not ill-appearing or toxic-appearing.  HENT:     Head: Normocephalic and atraumatic.     Right Ear: Hearing normal. No drainage.     Left Ear: Hearing normal. No drainage.  Eyes:     General: Lids are normal.        Right eye: No discharge.        Left eye: No discharge.     Conjunctiva/sclera: Conjunctivae normal.     Pupils: Pupils are equal, round, and reactive to light.  Neck:     Thyroid: No thyromegaly.     Vascular: No carotid bruit.  Cardiovascular:     Rate and Rhythm: Normal rate and regular rhythm.     Heart sounds: Normal heart sounds, S1 normal and S2 normal. No murmur heard.    No gallop.  Pulmonary:     Effort: Pulmonary effort is normal. No accessory muscle usage or respiratory distress.     Breath sounds: Normal breath sounds.  Abdominal:     General: Bowel sounds are normal. There is no distension.     Palpations: Abdomen is soft. There is no hepatomegaly.      Tenderness: There is no abdominal tenderness.     Hernia: No hernia is present.  Musculoskeletal:        General: Normal range of motion.     Cervical back: Normal range of motion and neck supple.     Right lower leg: No edema.     Left lower leg: No edema.  Skin:    General: Skin is warm and dry.     Capillary Refill: Capillary refill takes less than 2 seconds.     Findings: No rash.  Neurological:     Mental Status: He is alert and oriented to person, place, and time.     Motor: Motor function is intact.     Coordination: Coordination is intact.     Gait: Gait is intact.     Deep Tendon Reflexes: Reflexes are normal and symmetric.     Reflex Scores:      Brachioradialis reflexes are 2+ on the right side and 2+ on the left side.      Patellar reflexes are 2+ on the right side and 2+ on the left side. Psychiatric:        Attention and Perception: Attention normal.        Mood and Affect: Mood normal.        Speech: Speech normal.        Behavior: Behavior normal. Behavior is cooperative.        Thought Content: Thought content normal.    Results for orders placed or performed in visit on 02/01/22  Ferritin  Result Value Ref Range   Ferritin 206 30 - 400 ng/mL  Iron Binding Cap (TIBC)([facility])  Result Value Ref Range   Total Iron Binding Capacity 319 250 - 450 ug/dL   UIBC 248 111 - 343 ug/dL   Iron 71 38 - 169 ug/dL   Iron Saturation 22 15 - 55 %  CBC with Differential/Platelet  Result Value Ref Range   WBC 9.7 3.4 - 10.8 x10E3/uL   RBC 4.82 4.14 - 5.80 x10E6/uL   Hemoglobin 14.6 13.0 - 17.7 g/dL   Hematocrit 43.4  37.5 - 51.0 %   MCV 90 79 - 97 fL   MCH 30.3 26.6 - 33.0 pg   MCHC 33.6 31.5 - 35.7 g/dL   RDW 13.1 11.6 - 15.4 %   Platelets 362 150 - 450 x10E3/uL   Neutrophils 74 Not Estab. %   Lymphs 16 Not Estab. %   Monocytes 9 Not Estab. %   Eos 1 Not Estab. %   Basos 0 Not Estab. %   Neutrophils Absolute 7.1 (H) 1.4 - 7.0 x10E3/uL   Lymphocytes  Absolute 1.5 0.7 - 3.1 x10E3/uL   Monocytes Absolute 0.9 0.1 - 0.9 x10E3/uL   EOS (ABSOLUTE) 0.1 0.0 - 0.4 x10E3/uL   Basophils Absolute 0.0 0.0 - 0.2 x10E3/uL   Immature Granulocytes 0 Not Estab. %   Immature Grans (Abs) 0.0 0.0 - 0.1 x10E3/uL      Assessment & Plan:   Problem List Items Addressed This Visit       Cardiovascular and Mediastinum   Aortic atherosclerosis ([place])    This was noticed on November 2020 lung CT.  Continue statin therapy + recommend complete cessation of smoking.  Lipid panel today.      Relevant Orders   Comprehensive metabolic panel   Lipid Panel w/o Chol/HDL Ratio   Essential hypertension    Chronic, ongoing with BP remaining stable. Continue current medication regimen as prescribed by Dr. Smith [facility]ince at [facility], will take over these refills and have him return to them as needed.  Continued alcohol use but is cutting back.  Recommend cutting back on alcohol and smoking.  Continue collaboration with cardiology. CMP, CBC today.        Relevant Orders   Comprehensive metabolic panel     Respiratory   Paraseptal emphysema ([place]) - Primary    Chronic, ongoing.  Attempt at spirometry today poorly tolerated.  Will get him into pulmonary, referral placed, due to increased SOBE.  Stop Stiolto and start Judithann Sauger, educated him on this change, plus continue Albuterol as needed.  Continue yearly lung screening -- recent done in December 2022.  Recommend complete cessation of smoking. Return in 8 weeks.      Relevant Medications   Budeson-Glycopyrrol-Formoterol (BREZTRI AEROSPHERE) 160-9-4.8 MCG/ACT AERO   Other Relevant Orders   CBC with Differential/Platelet   Spirometry with Graph (Completed)   Ambulatory referral to Pulmonology     Digestive   GERD (gastroesophageal reflux disease)    Chronic, ongoing.  Continue Omeprazole daily and adjust as needed.  Repeat CMP, and CBC today. Recent Hep labs negative. Continue to recommend cut back on alcohol use and recommend he  call GI to schedule with them due to ongoing issues with meals.      Relevant Orders   Magnesium     Nervous and Auditory   Cervical myelopathy (HCC)    Has seen Dr. Cari Caraway, wishes to return to visit him.  Will place new referral to providers new clinic setting for patient follow-up.  Appreciate his input.      Relevant Orders   Ambulatory referral to Neurosurgery   Nicotine dependence, cigarettes, w unsp disorders    I have recommended complete cessation of tobacco use. I have discussed various options available for assistance with tobacco cessation including over the counter methods (Nicotine gum, patch and lozenges). We also discussed prescription options (Chantix, Nicotine Inhaler / Nasal Spray). The patient is not interested in pursuing any prescription tobacco cessation options at this time.       Relevant Orders  CBC with Differential/Platelet     Genitourinary   BPH (benign prostatic hyperplasia)    Chronic, ongoing.  Is taking Finasteride and Flomax daily.  Continue to collaborate with urology. PSA today on labs.      Relevant Orders   PSA     Other   Alcohol dependence ([place])    Recommend continued cutting back -- concern for overall health with ongoing heavy use and suspect major reason for his overall feeling bad.  Is aware of effect of heavy alcohol use on overall health, especially BP.   Check labs today to include CMP, CBC, mag level.      Relevant Orders   Comprehensive metabolic panel   Chronic post-traumatic stress disorder (PTSD)    Refer to schizophrenia plan of care.      Mixed hyperlipidemia    Chronic, ongoing.  Continue current medication regimen and adjust as needed.  Lipid panel today.       Relevant Orders   Comprehensive metabolic panel   Lipid Panel w/o Chol/HDL Ratio   Schizophrenia (HCC)    Chronic, ongoing.  No current medications.  Tolerating this and does not wish to start medication.  Could consider Seroquel in future if needed  or return to Clonidine.  Denies SI/HI.      Relevant Orders   Comprehensive metabolic panel     Follow up plan: Return in about 8 weeks (around 07/23/2022) for FATIGUE.

## 2022-05-28 NOTE — Assessment & Plan Note (Signed)
Chronic, ongoing.  Attempt at spirometry today poorly tolerated.  Will get him into pulmonary, referral placed, due to increased SOBE.  Stop Stiolto and start Judithann Sauger, educated him on this change, plus continue Albuterol as needed.  Continue yearly lung screening -- recent done in December 2022.  Recommend complete cessation of smoking. Return in 8 weeks.

## 2022-05-28 NOTE — Assessment & Plan Note (Signed)
Chronic, ongoing.  Is taking Finasteride and Flomax daily.  Continue to collaborate with urology. PSA today on labs.

## 2022-05-28 NOTE — Assessment & Plan Note (Signed)
Has seen Dr. Cari Caraway, wishes to return to visit him.  Will place new referral to providers new clinic setting for patient follow-up.  Appreciate his input.

## 2022-05-28 NOTE — Assessment & Plan Note (Signed)
Chronic, ongoing with BP remaining stable. Continue current medication regimen as prescribed by Dr. Smith Mince at [facility], will take over these refills and have him return to them as needed.  Continued alcohol use but is cutting back.  Recommend cutting back on alcohol and smoking.  Continue collaboration with cardiology. CMP, CBC today.

## 2022-05-28 NOTE — Assessment & Plan Note (Signed)
Chronic, ongoing.  Continue current medication regimen and adjust as needed. Lipid panel today. 

## 2022-05-28 NOTE — Assessment & Plan Note (Signed)
Recommend continued cutting back -- concern for overall health with ongoing heavy use and suspect major reason for his overall feeling bad.  Is aware of effect of heavy alcohol use on overall health, especially BP.   Check labs today to include CMP, CBC, mag level.

## 2022-05-28 NOTE — Assessment & Plan Note (Signed)
Chronic, ongoing.  Continue Omeprazole daily and adjust as needed.  Repeat CMP, and CBC today. Recent Hep labs negative. Continue to recommend cut back on alcohol use and recommend he call GI to schedule with them due to ongoing issues with meals.

## 2022-05-28 NOTE — Assessment & Plan Note (Signed)
Refer to schizophrenia plan of care. 

## 2022-05-28 NOTE — Assessment & Plan Note (Signed)
I have recommended complete cessation of tobacco use. I have discussed various options available for assistance with tobacco cessation including over the counter methods (Nicotine gum, patch and lozenges). We also discussed prescription options (Chantix, Nicotine Inhaler / Nasal Spray). The patient is not interested in pursuing any prescription tobacco cessation options at this time.  

## 2022-05-29 ENCOUNTER — Encounter: Payer: Self-pay | Admitting: Nurse Practitioner

## 2022-05-29 LAB — LIPID PANEL W/O CHOL/HDL RATIO
Cholesterol, Total: 147 mg/dL (ref 100–199)
HDL: 52 mg/dL (ref >39)
LDL Chol Calc (NIH): 70 mg/dL (ref 0–99)
Triglycerides: 142 mg/dL (ref 0–149)
VLDL Cholesterol Cal: 25 mg/dL (ref 5–40)

## 2022-05-29 LAB — CBC WITH DIFFERENTIAL/PLATELET
Basophils Absolute: 0 10*3/uL (ref 0.0–0.2)
Basos: 0 %
EOS (ABSOLUTE): 0.1 10*3/uL (ref 0.0–0.4)
Eos: 1 %
Hematocrit: 40.5 % (ref 37.5–51.0)
Hemoglobin: 13.7 g/dL (ref 13.0–17.7)
Immature Grans (Abs): 0 10*3/uL (ref 0.0–0.1)
Immature Granulocytes: 0 %
Lymphocytes Absolute: 1.9 10*3/uL (ref 0.7–3.1)
Lymphs: 21 %
MCH: 30.2 pg (ref 26.6–33.0)
MCHC: 33.8 g/dL (ref 31.5–35.7)
MCV: 89 fL (ref 79–97)
Monocytes Absolute: 0.8 10*3/uL (ref 0.1–0.9)
Monocytes: 9 %
Neutrophils Absolute: 6.2 10*3/uL (ref 1.4–7.0)
Neutrophils: 69 %
Platelets: 359 10*3/uL (ref 150–450)
RBC: 4.53 x10E6/uL (ref 4.14–5.80)
RDW: 12.5 % (ref 11.6–15.4)
WBC: 9 10*3/uL (ref 3.4–10.8)

## 2022-05-29 LAB — COMPREHENSIVE METABOLIC PANEL
ALT: 18 IU/L (ref 0–44)
AST: 20 IU/L (ref 0–40)
Albumin/Globulin Ratio: 1.4 (ref 1.2–2.2)
Albumin: 4.2 g/dL (ref 3.8–4.9)
Alkaline Phosphatase: 80 IU/L (ref 44–121)
BUN/Creatinine Ratio: 17 (ref 9–20)
BUN: 16 mg/dL (ref 6–24)
Bilirubin Total: 0.5 mg/dL (ref 0.0–1.2)
CO2: 22 mmol/L (ref 20–29)
Calcium: 9.4 mg/dL (ref 8.7–10.2)
Chloride: 93 mmol/L — ABNORMAL LOW (ref 96–106)
Creatinine, Ser: 0.96 mg/dL (ref 0.76–1.27)
Globulin, Total: 3 g/dL (ref 1.5–4.5)
Glucose: 95 mg/dL (ref 70–99)
Potassium: 4.2 mmol/L (ref 3.5–5.2)
Sodium: 132 mmol/L — ABNORMAL LOW (ref 134–144)
Total Protein: 7.2 g/dL (ref 6.0–8.5)
eGFR: 92 mL/min/{1.73_m2} (ref >59)

## 2022-05-29 LAB — MAGNESIUM: Magnesium: 1.7 mg/dL (ref 1.6–2.3)

## 2022-05-29 LAB — PSA: Prostate Specific Ag, Serum: 0.4 ng/mL (ref 0.0–4.0)

## 2022-05-29 NOTE — Progress Notes (Signed)
Contacted via MyChart   Good afternoon Luke Briggs, overall your labs remain stable including kidney function, creatinine and eGFR, remains normal, as is liver function, AST and ALT.   Your sodium, salt, level is on low side and this can be from heavier alcohol use.  Please continue to reduce alcohol use and add a little salt to diet.  Please ensure to schedule with pulmonary and the GI provider to further discuss concerns and for more in depth work up.  Any questions? Keep being stellar!!  Thank you for allowing me to participate in your care.  I appreciate you. Kindest regards, Kenesha Moshier

## 2022-05-30 ENCOUNTER — Telehealth: Payer: Self-pay | Admitting: Nurse Practitioner

## 2022-05-30 NOTE — Telephone Encounter (Signed)
PA started for [facility] by calling  [place] [employer],  Has been approved though 05/25/23 PA # [id number]  Call # [phone]  Patient was made aware

## 2022-05-30 NOTE — Telephone Encounter (Signed)
Pt was prescribed Budeson-Glycopyrrol-Formoterol (BREZTRI AEROSPHERE) 160-9-4.8 MCG/ACT AERO insurance in requiring a PA. Pt said pharmacy has faxed over request. Please assist patient further.

## 2022-06-01 ENCOUNTER — Encounter: Payer: Self-pay | Admitting: Nurse Practitioner

## 2022-06-11 ENCOUNTER — Encounter: Payer: Self-pay | Admitting: Nurse Practitioner

## 2022-06-11 DIAGNOSIS — Z599 Problem related to housing and economic circumstances, unspecified: Secondary | ICD-10-CM

## 2022-06-13 ENCOUNTER — Telehealth: Payer: Self-pay | Admitting: *Deleted

## 2022-06-13 NOTE — Telephone Encounter (Signed)
   Telephone encounter was:  Successful.  06/13/2022 Name: Murry Diaz. MRN: [id number] DOB: [birth year]  [name]. is a 61 y.o. year old male who is a primary care patient of [name], Barbaraann Faster, NP . The community resource team was consulted for assistance with Financial Difficulties related to utilities and food   Care guide performed the following interventions: Patient provided with information about care guide support team and interviewed to confirm resource needs. Patient declined assitance at this tome said he had [employer] power handled , offered food Banks and [employer] aplication he clined said he would reach out when he needed propane in his tank     Follow Up Plan:  No further follow up planned at this time. The patient has been provided with needed resources. [place], Care Management  [phone] [address] , [place] [address] Email : [email or web address]

## 2022-07-13 NOTE — Patient Instructions (Signed)
Fatigue If you have fatigue, you feel tired all the time and have a lack of energy or a lack of motivation. Fatigue may make it difficult to start or complete tasks because of exhaustion. Occasional or mild fatigue is often a normal response to activity or life. However, long-term (chronic) or extreme fatigue may be a symptom of a medical condition such as: Depression. Not having enough red blood cells or hemoglobin in the blood (anemia). A problem with a small gland located in the lower front part of the neck (thyroid disorder). Rheumatologic conditions. These are problems related to the body's defense system (immune system). Infections, especially certain viral infections. Fatigue can also lead to negative health outcomes over time. Follow these instructions at home: Medicines Take over-the-counter and prescription medicines only as told by your health care provider. Take a multivitamin if told by your health care provider. Do not use herbal or dietary supplements unless they are approved by your health care provider. Eating and drinking  Avoid heavy meals in the evening. Eat a well-balanced diet, which includes lean proteins, whole grains, plenty of fruits and vegetables, and low-fat dairy products. Avoid eating or drinking too many products with caffeine in them. Avoid alcohol. Drink enough fluid to keep your urine pale yellow. Activity  Exercise regularly, as told by your health care provider. Use or practice techniques to help you relax, such as yoga, tai chi, meditation, or massage therapy. Lifestyle Change situations that cause you stress. Try to keep your work and personal schedules in balance. Do not use recreational or illegal drugs. General instructions Monitor your fatigue for any changes. Go to bed and get up at the same time every day. Avoid fatigue by pacing yourself during the day and getting enough sleep at night. Maintain a healthy weight. Contact a health care  provider if: Your fatigue does not get better. You have a fever. You suddenly lose or gain weight. You have headaches. You have trouble falling asleep or sleeping through the night. You feel angry, guilty, anxious, or sad. You have swelling in your legs or another part of your body. Get help right away if: You feel confused, feel like you might faint, or faint. Your vision is blurry or you have a severe headache. You have severe pain in your abdomen, your back, or the area between your waist and hips (pelvis). You have chest pain, shortness of breath, or an irregular or fast heartbeat. You are unable to urinate, or you urinate less than normal. You have abnormal bleeding from the rectum, nose, lungs, nipples, or, if you are male, the vagina. You vomit blood. You have thoughts about hurting yourself or others. These symptoms may be an emergency. Get help right away. Call 911. Do not wait to see if the symptoms will go away. Do not drive yourself to the hospital. Get help right away if you feel like you may hurt yourself or others, or have thoughts about taking your own life. Go to your nearest emergency room or: Call 911. Call the [facility] at [phone] or 988. This is open 24 hours a day. Text the Crisis Text Line at 979-823-0385. Summary If you have fatigue, you feel tired all the time and have a lack of energy or a lack of motivation. Fatigue may make it difficult to start or complete tasks because of exhaustion. Long-term (chronic) or extreme fatigue may be a symptom of a medical condition. Exercise regularly, as told by your health care provider.  Change situations that cause you stress. Try to keep your work and personal schedules in balance. This information is not intended to replace advice given to you by your health care provider. Make sure you discuss any questions you have with your health care provider. Document Revised: 08/27/2021 Document  Reviewed: 08/27/2021 Elsevier Patient Education  2023 Elsevier Inc.  

## 2022-07-18 ENCOUNTER — Ambulatory Visit (INDEPENDENT_AMBULATORY_CARE_PROVIDER_SITE_OTHER): Payer: Medicaid Other | Admitting: Neurosurgery

## 2022-07-18 ENCOUNTER — Encounter: Payer: Self-pay | Admitting: Neurosurgery

## 2022-07-18 ENCOUNTER — Encounter: Payer: Self-pay | Admitting: Nurse Practitioner

## 2022-07-18 ENCOUNTER — Ambulatory Visit (INDEPENDENT_AMBULATORY_CARE_PROVIDER_SITE_OTHER): Payer: Medicaid Other | Admitting: Nurse Practitioner

## 2022-07-18 VITALS — BP 181/106 | HR 81 | Ht 70.0 in | Wt 196.4 lb

## 2022-07-18 VITALS — BP 138/86 | HR 83 | Temp 98.0°F | Ht 70.0 in | Wt 198.0 lb

## 2022-07-18 DIAGNOSIS — M4802 Spinal stenosis, cervical region: Secondary | ICD-10-CM | POA: Diagnosis not present

## 2022-07-18 DIAGNOSIS — H9313 Tinnitus, bilateral: Secondary | ICD-10-CM

## 2022-07-18 DIAGNOSIS — R748 Abnormal levels of other serum enzymes: Secondary | ICD-10-CM

## 2022-07-18 DIAGNOSIS — J438 Other emphysema: Secondary | ICD-10-CM | POA: Diagnosis not present

## 2022-07-18 DIAGNOSIS — G959 Disease of spinal cord, unspecified: Secondary | ICD-10-CM

## 2022-07-18 DIAGNOSIS — Z8673 Personal history of transient ischemic attack (TIA), and cerebral infarction without residual deficits: Secondary | ICD-10-CM | POA: Diagnosis not present

## 2022-07-18 DIAGNOSIS — F10288 Alcohol dependence with other alcohol-induced disorder: Secondary | ICD-10-CM

## 2022-07-18 DIAGNOSIS — R5383 Other fatigue: Secondary | ICD-10-CM

## 2022-07-18 DIAGNOSIS — F17219 Nicotine dependence, cigarettes, with unspecified nicotine-induced disorders: Secondary | ICD-10-CM

## 2022-07-18 DIAGNOSIS — I10 Essential (primary) hypertension: Secondary | ICD-10-CM

## 2022-07-18 DIAGNOSIS — I6523 Occlusion and stenosis of bilateral carotid arteries: Secondary | ICD-10-CM

## 2022-07-18 DIAGNOSIS — R42 Dizziness and giddiness: Secondary | ICD-10-CM

## 2022-07-18 NOTE — Assessment & Plan Note (Signed)
Recommend continued cutting back -- concern for overall health with ongoing heavy use and suspect major reason for his overall feeling bad.  Is aware of effect of heavy alcohol use on overall health, especially BP.   Check labs today to include CMP, CBC, ferritin, iron, B12.

## 2022-07-18 NOTE — Assessment & Plan Note (Signed)
History of infarct, continue collaboration with neuro and ENT for ongoing tinnitus, headaches, and dizziness.  Repeat MRI brain to further assess due to recent episode of numbness left side and vertigo ongoing -- recent MRI in March showed no acute issues, but concern with his recent numbness episode.  Referral to return to ENT and neuro placed.  Would like to perform STAT MRI, but patient reports he can not attend today or tomorrow due to schedule.  Have recommended if any repeat of numbness episode noted he is to immediately go to ER for assessment.

## 2022-07-18 NOTE — Assessment & Plan Note (Signed)
Ongoing issue, suspect multifactorial due to alcohol and nicotine use + chronic pain and mood.  Will recheck labs today and continue to work with specialists.

## 2022-07-18 NOTE — Assessment & Plan Note (Signed)
Ongoing.  Returns to see Dr. Cari Caraway this afternoon for further discussion and recommendatons.  Appreciate his input. ?dizziness and numbness related to this.

## 2022-07-18 NOTE — Assessment & Plan Note (Signed)
Recheck CMP, CBC, GTT today -- may pursue ultrasound to further assess area, recent CT was negative for acute findings. 

## 2022-07-18 NOTE — Assessment & Plan Note (Signed)
Chronic, ongoing.  Scheduled to see pulmonary upcoming, highly recommend he attend this visit and he is aware of date/time.  Continue Breztri BID and Albuterol as needed at this time.  Continue yearly lung screening -- recent done in December 2022.  Recommend complete cessation of smoking. Return in 6 weeks.

## 2022-07-18 NOTE — Assessment & Plan Note (Signed)
Ongoing issue post CVA -- will get him back into neuro and ENT for assessment, recommend he attend visit with them.  Repeat MRI brain to further assess due to recent episode of numbness left side and vertigo ongoing -- recent MRI in March showed no acute issues, but concern with his recent numbness episode.  Would like to perform STAT MRI, but patient reports he can not attend today or tomorrow due to schedule.  Have recommended if any repeat of numbness episode noted he is to immediately go to ER for assessment.

## 2022-07-18 NOTE — Assessment & Plan Note (Signed)
I have recommended complete cessation of tobacco use. I have discussed various options available for assistance with tobacco cessation including over the counter methods (Nicotine gum, patch and lozenges). We also discussed prescription options (Chantix, Nicotine Inhaler / Nasal Spray). The patient is not interested in pursuing any prescription tobacco cessation options at this time.  Continue yearly lung CA screening.  

## 2022-07-18 NOTE — Assessment & Plan Note (Signed)
Ongoing issue since CVA with episodes daily.  Suspect more related to past CVA and heavy drinking.  Referral to return to ENT for assessment.

## 2022-07-18 NOTE — Progress Notes (Signed)
BP 138/86 (BP Location: Left Arm, Patient Position: Sitting)   Pulse 83   Temp 98 F (36.7 C) (Oral)   Ht 5' 10"  (1.778 m)   Wt 198 lb (89.8 kg)   SpO2 98%   BMI 28.41 kg/m    Subjective:    Patient ID: Luke Pollen., male    DOB: [birth year], 61 y.o.   MRN: [id number]  HPI: Luke Hellberg. is a 61 y.o. male  Chief Complaint  Patient presents with   Fatigue    Patient is here for 6 month follow up on Fatigue. Patient says the fatigue has gotten worse since his last visit.    Dizziness    Patient says he is currently dizzy at the moment and can barely walk. Patient says he is still having the dizzy spells every day. Patient says the episodes are lasting longer, but still come and go. Patient says he just goes to sleep to keep from experiencing it.    Numbness    Patient says about 4 days ago (Sunday), his whole left side went numb and he blacked out and could not even talk to his girlfriend. Patient said it last about 15 seconds.   Breathing Problem    Patient says he is still having issues with his breathing. Patient says he has slowed down on smoking and it has been 7 days since his last alcoholic beverage.    FATIGUE/DIZZINESS/NUMBNESS He has chronic issues with fatigue/numbness and dizziness.  Known chronic right basal ganglia lacunar infarct on imaging and continues to complain of ongoing tinnitus, fatigue, numbness, and intermittent dizziness -- sees ENT & neuro for this and last saw Dr. Melrose Nakayama 03/08/21. Had episode of numbness on Sunday where is whole left side went numb and he blacked out, could not talk to girlfriend = lasted 20 - 30 seconds, then came back around, got up and urinated, went back to sleep.  He reports ongoing issues, has not been back to see neurology as instructed.  Did see neurosurgery for cervical myelopathy -- 02/26/22 = returns this afternoon to see them. Recent MRI brain on 01/25/22 showed no acute changes and small remote lacunar infarct.  Last  vascular visit 02/01/21 with ultrasound showing widely patent RICA and 52% LICA stenosis.  Drinking alcohol once a week -- 8 to 10 [employer]. Continues to report drinking helps his tinnitus and reports he feels worse if he does not drink. Duration:  chronic Severity: 9/10  Onset: gradual Context when symptoms started:  unknown Symptoms improve with rest: no  Depressive symptoms: yes Stress/anxiety: no Insomnia: yes hard to fall asleep Snoring: no Observed apnea by bed partner: no Daytime hypersomnolence:yes Wakes feeling refreshed: no History of sleep study: no Dysnea on exertion:  yes - occasional Orthopnea/PND: no Chest pain: no Chronic cough: no Lower extremity edema: no Arthralgias:no Myalgias: no Weakness: yes Rash: no   COPD Uses Breztri and Albuterol as needed -- changed to [place] last visit, but reports not noticing much different in breathing.  Taking Montelukast.  Had last lung screening on 10/23/21 -- noting emphysema and aortic atherosclerosis.  Placed referral to pulmonary last visit and sees 08/02/22.  Continues to smoke 1 PPD, has been smoking for 45 years.  Not interested in quitting.  Has cut back. Satisfied with current treatment?: yes Oxygen use: no Dyspnea frequency: minimal Cough frequency: none Rescue inhaler frequency:  2-3 times a day Limitation of activity: no Productive cough: none Last Spirometry: today Pneumovax:  Not up to Date Influenza: Up to Date    Relevant past medical, surgical, family and social history reviewed and updated as indicated. Interim medical history since our last visit reviewed. Allergies and medications reviewed and updated.  Review of Systems  Constitutional:  Positive for fatigue. Negative for activity change, chills, diaphoresis and fever.  Respiratory:  Positive for shortness of breath. Negative for cough, chest tightness and wheezing.   Cardiovascular:  Negative for chest pain, palpitations and leg swelling.   Gastrointestinal: Negative.   Endocrine: Negative.   Neurological:  Positive for dizziness and numbness. Negative for syncope, weakness, light-headedness and headaches.  Psychiatric/Behavioral: Negative.      Per HPI unless specifically indicated above     Objective:    BP 138/86 (BP Location: Left Arm, Patient Position: Sitting)   Pulse 83   Temp 98 F (36.7 C) (Oral)   Ht 5' 10"  (1.778 m)   Wt 198 lb (89.8 kg)   SpO2 98%   BMI 28.41 kg/m   Wt Readings from Last 3 Encounters:  07/18/22 198 lb (89.8 kg)  05/28/22 198 lb 12.8 oz (90.2 kg)  04/26/22 210 lb (95.3 kg)    Physical Exam Vitals and nursing note reviewed.  Constitutional:      General: He is awake. He is not in acute distress.    Appearance: He is well-developed and well-groomed. He is not ill-appearing or toxic-appearing.  HENT:     Head: Normocephalic and atraumatic.     Right Ear: Hearing normal. No drainage.     Left Ear: Hearing normal. No drainage.  Eyes:     General: Lids are normal.        Right eye: No discharge.        Left eye: No discharge.     Conjunctiva/sclera: Conjunctivae normal.     Pupils: Pupils are equal, round, and reactive to light.     Visual Fields: Right eye visual fields normal and left eye visual fields normal.  Neck:     Thyroid: No thyromegaly.     Vascular: No carotid bruit.  Cardiovascular:     Rate and Rhythm: Normal rate and regular rhythm.     Heart sounds: Normal heart sounds, S1 normal and S2 normal. No murmur heard.    No gallop.  Pulmonary:     Effort: Pulmonary effort is normal. No accessory muscle usage or respiratory distress.     Breath sounds: Wheezing present. No decreased breath sounds or rhonchi.     Comments: Scattered expiratory wheezes throughout per baseline. Abdominal:     General: Bowel sounds are normal. There is no distension.     Palpations: Abdomen is soft. There is no hepatomegaly.     Tenderness: There is no abdominal tenderness.     Hernia:  No hernia is present.  Musculoskeletal:        General: Normal range of motion.     Cervical back: Normal range of motion and neck supple.     Right lower leg: No edema.     Left lower leg: No edema.  Skin:    General: Skin is warm and dry.     Capillary Refill: Capillary refill takes less than 2 seconds.     Findings: No rash.  Neurological:     Mental Status: He is alert and oriented to person, place, and time.     Cranial Nerves: Cranial nerves 2-12 are intact.     Sensory: Sensation is intact.  Motor: Motor function is intact.     Coordination: Coordination is intact.     Gait: Gait is intact.     Deep Tendon Reflexes: Reflexes are normal and symmetric.     Reflex Scores:      Brachioradialis reflexes are 2+ on the right side and 2+ on the left side.      Patellar reflexes are 2+ on the right side and 2+ on the left side. Psychiatric:        Attention and Perception: Attention normal.        Mood and Affect: Mood normal.        Speech: Speech normal.        Behavior: Behavior normal. Behavior is cooperative.        Thought Content: Thought content normal.     Results for orders placed or performed in visit on 05/28/22  CBC with Differential/Platelet  Result Value Ref Range   WBC 9.0 3.4 - 10.8 x10E3/uL   RBC 4.53 4.14 - 5.80 x10E6/uL   Hemoglobin 13.7 13.0 - 17.7 g/dL   Hematocrit 40.5 37.5 - 51.0 %   MCV 89 79 - 97 fL   MCH 30.2 26.6 - 33.0 pg   MCHC 33.8 31.5 - 35.7 g/dL   RDW 12.5 11.6 - 15.4 %   Platelets 359 150 - 450 x10E3/uL   Neutrophils 69 Not Estab. %   Lymphs 21 Not Estab. %   Monocytes 9 Not Estab. %   Eos 1 Not Estab. %   Basos 0 Not Estab. %   Neutrophils Absolute 6.2 1.4 - 7.0 x10E3/uL   Lymphocytes Absolute 1.9 0.7 - 3.1 x10E3/uL   Monocytes Absolute 0.8 0.1 - 0.9 x10E3/uL   EOS (ABSOLUTE) 0.1 0.0 - 0.4 x10E3/uL   Basophils Absolute 0.0 0.0 - 0.2 x10E3/uL   Immature Granulocytes 0 Not Estab. %   Immature Grans (Abs) 0.0 0.0 - 0.1 x10E3/uL   Comprehensive metabolic panel  Result Value Ref Range   Glucose 95 70 - 99 mg/dL   BUN 16 6 - 24 mg/dL   Creatinine, Ser 0.96 0.76 - 1.27 mg/dL   eGFR 92 >59 mL/min/1.73   BUN/Creatinine Ratio 17 9 - 20   Sodium 132 (L) 134 - 144 mmol/L   Potassium 4.2 3.5 - 5.2 mmol/L   Chloride 93 (L) 96 - 106 mmol/L   CO2 22 20 - 29 mmol/L   Calcium 9.4 8.7 - 10.2 mg/dL   Total Protein 7.2 6.0 - 8.5 g/dL   Albumin 4.2 3.8 - 4.9 g/dL   Globulin, Total 3.0 1.5 - 4.5 g/dL   Albumin/Globulin Ratio 1.4 1.2 - 2.2   Bilirubin Total 0.5 0.0 - 1.2 mg/dL   Alkaline Phosphatase 80 44 - 121 IU/L   AST 20 0 - 40 IU/L   ALT 18 0 - 44 IU/L  Lipid Panel w/o Chol/HDL Ratio  Result Value Ref Range   Cholesterol, Total 147 100 - 199 mg/dL   Triglycerides 142 0 - 149 mg/dL   HDL 52 >39 mg/dL   VLDL Cholesterol Cal 25 5 - 40 mg/dL   LDL Chol Calc (NIH) 70 0 - 99 mg/dL  PSA  Result Value Ref Range   Prostate Specific Ag, Serum 0.4 0.0 - 4.0 ng/mL  Magnesium  Result Value Ref Range   Magnesium 1.7 1.6 - 2.3 mg/dL      Assessment & Plan:   Problem List Items Addressed This Visit       Cardiovascular  and Mediastinum   Carotid stenosis    Continue collaboration with vascular on yearly basis, did not attend scheduled 02/01/22 visit. Recommend he reschedule.  Will repeat carotid duplex to further assess due to ongoing dizziness and recent numbness episode, would like to perform STAT, but patient reports he can not attend today or tomorrow due to schedule.  Have recommend if any repeat of numbness episode noted he is to immediately go to ER for assessment.      Essential hypertension    Chronic, ongoing with BP remaining stable on recheck today. Continue current medication regimen as prescribed by Dr. Smith Mince at [facility], will take over these refills and have him return to them as needed.  Continued alcohol use but is cutting back.  Recommend cutting back on alcohol and smoking -- work towards complete cessation.   Recommend he monitor BP at least a few mornings a week at home and document.  DASH diet at home.  Labs today: CBC and CMP.         Respiratory   Paraseptal emphysema (HCC) - Primary    Chronic, ongoing.  Scheduled to see pulmonary upcoming, highly recommend he attend this visit and he is aware of date/time.  Continue Breztri BID and Albuterol as needed at this time.  Continue yearly lung screening -- recent done in December 2022.  Recommend complete cessation of smoking. Return in 6 weeks.      Relevant Medications   montelukast (SINGULAIR) 10 MG tablet     Nervous and Auditory   Cervical myelopathy (HCC)    Ongoing.  Returns to see Dr. Cari Caraway this afternoon for further discussion and recommendatons.  Appreciate his input. ?dizziness and numbness related to this.      Nicotine dependence, cigarettes, w unsp disorders    I have recommended complete cessation of tobacco use. I have discussed various options available for assistance with tobacco cessation including over the counter methods (Nicotine gum, patch and lozenges). We also discussed prescription options (Chantix, Nicotine Inhaler / Nasal Spray). The patient is not interested in pursuing any prescription tobacco cessation options at this time.  Continue yearly lung CA screening.         Other   Alcohol dependence ([place])    Recommend continued cutting back -- concern for overall health with ongoing heavy use and suspect major reason for his overall feeling bad.  Is aware of effect of heavy alcohol use on overall health, especially BP.   Check labs today to include CMP, CBC, ferritin, iron, B12.      Chronic vertigo    Ongoing issue post CVA -- will get him back into neuro and ENT for assessment, recommend he attend visit with them.  Repeat MRI brain to further assess due to recent episode of numbness left side and vertigo ongoing -- recent MRI in March showed no acute issues, but concern with his recent numbness episode.  Would  like to perform STAT MRI, but patient reports he can not attend today or tomorrow due to schedule.  Have recommended if any repeat of numbness episode noted he is to immediately go to ER for assessment.      Relevant Orders   Ambulatory referral to Neurology   MR Brain W Wo Contrast   US Carotid Duplex Bilateral   Elevated serum GGT level    Recheck CMP, CBC, GTT today -- may pursue ultrasound to further assess area, recent CT was negative for acute findings.      Relevant  Orders   Gamma GT   Comprehensive metabolic panel   Fatigue    Ongoing issue, suspect multifactorial due to alcohol and nicotine use + chronic pain and mood.  Will recheck labs today and continue to work with specialists.        Relevant Orders   CBC with Differential/Platelet   Vitamin B12   VITAMIN D 25 Hydroxy (Vit-D Deficiency, Fractures)   Iron Binding Cap (TIBC)([facility])   Ferritin   Comprehensive metabolic panel   Ambulatory referral to Neurology   History of lacunar cerebrovascular accident    History of infarct, continue collaboration with neuro and ENT for ongoing tinnitus, headaches, and dizziness.  Repeat MRI brain to further assess due to recent episode of numbness left side and vertigo ongoing -- recent MRI in March showed no acute issues, but concern with his recent numbness episode.  Referral to return to ENT and neuro placed.  Would like to perform STAT MRI, but patient reports he can not attend today or tomorrow due to schedule.  Have recommended if any repeat of numbness episode noted he is to immediately go to ER for assessment.      Relevant Orders   Ambulatory referral to Neurology   MR Brain W Wo Contrast   US Carotid Duplex Bilateral   Tinnitus of both ears    Ongoing issue since CVA with episodes daily.  Suspect more related to past CVA and heavy drinking.  Referral to return to ENT for assessment.      Relevant Orders   Ambulatory referral to ENT   Ambulatory referral to  Neurology     Follow up plan: Return in about 6 weeks (around 08/29/2022) for Dizziness.

## 2022-07-18 NOTE — Progress Notes (Signed)
Referring Physician:  Meade Maw, [place] [facility] [place],  [name] [address]  Primary Physician:  [name], NP  History of Present Illness: 07/18/2022 [name] is here today with a chief complaint of discomfort in his left arm and neck pain.  He feels that his balance has worsened.  02/26/2022 [name] is here today with a chief complaint of pain in his head, neck, and bilateral shoulder blades. He also complains of intermittent numbness and tingling in his hands, balance issues, dropping items, difficulty picking up items, and difficulty opening bottles.  Been having slightly worsening symptoms over the past year. He reports numbness and tingling. Movement makes his symptoms worse. Sleeping makes it better.  Conservative measures:  Physical therapy: has not participated  Multimodal medical therapy including regular antiinflammatories: tylenol, vicodin Injections: has not had any epidural steroid injections  Past Surgery: denies  Luke Briggs has symptoms of cervical myelopathy.  History of present illness from Luke Briggs, [place] on 02/07/22: [name] is a 61 y.o with a history of reactive airway disease, HLD, GERD, urinary retention, hx of CVA who is here today for further evaluation of his cervical spine. He states he has had neck pain for many years that radiates into his bilateral shoulder blades and posterior aspect of his neck and head into the top of his scalp bilaterally. States this is worse with movement of his neck. He endorses numbness and tingling in his hands and all 5 digits that is constant in nature. He also endorses some intermittent episodes of imbalance as well as difficulty dropping things and changes in dexterity over the last year. He denies any radiating arm symptoms. In addition to these complaints, his primary complaint is constant bilateral ringing in his ears that sounds like cicadas. He has seen  Dr. Melrose Nakayama and states he has seen an ENT (however I do not see documentation of this). This seems to be unrelated to the severity of his neck pain. Of note, [name] smokes a pack of cigarettes a day and drinks about 4-5 beers daily.  The symptoms are causing a significant impact on the patient's life.   Review of Systems:  A 10 point review of systems is negative, except for the pertinent positives and negatives detailed in the HPI.  Past Medical History: Past Medical History:  Diagnosis Date   Angioedema 08/07/2019   Arthritis    knees,shoulders, ankles, most joints   Cervical disc disorder    difficuly moving neck left   COPD (chronic obstructive pulmonary disease) (HCC)    chronic cough and wheezing   Dyspnea    easily   GERD (gastroesophageal reflux disease)    Hemorrhoids    History of hiatal hernia    Hypertension    controlled on meds   Pneumonia 04/2019   Prostate disorder    PTSD (post-traumatic stress disorder)    Schizophrenia ([place])    Per patient's report.    Past Surgical History: Past Surgical History:  Procedure Laterality Date   CATARACT EXTRACTION W/PHACO Left 03/28/2020   Procedure: CATARACT EXTRACTION PHACO AND INTRAOCULAR LENS PLACEMENT ([place]) LEFT MALYUGIN  MILOOP,   5.29  00:41.0;  Surgeon: Birder Robson, MD;  Location: [place];  Service: Ophthalmology;  Laterality: Left;   COLONOSCOPY WITH PROPOFOL N/A 12/14/2020   Procedure: COLONOSCOPY WITH PROPOFOL;  Surgeon: Jonathon Bellows, MD;  Location: [facility] ENDOSCOPY;  Service: Gastroenterology;  Laterality: N/A;   ESOPHAGOGASTRODUODENOSCOPY (EGD)  WITH PROPOFOL N/A 10/18/2021   Procedure: ESOPHAGOGASTRODUODENOSCOPY (EGD) WITH PROPOFOL;  Surgeon: Virgel Manifold, MD;  Location: [facility] ENDOSCOPY;  Service: Endoscopy;  Laterality: N/A;   EYE SURGERY Left    injury from a nail   FOOT SURGERY Left     Allergies: Allergies as of 07/18/2022 - Review Complete 07/18/2022  Allergen Reaction Noted    Lisinopril Swelling 08/08/2019   Iodinated contrast media Hives 02/01/2022   Amlodipine  10/08/2019   Penicillins Rash 04/28/2019    Medications: No outpatient medications have been marked as taking for the 07/18/22 encounter (Office Visit) with Meade Maw, MD.    Social History: Social History   Tobacco Use   Smoking status: Every Day    Packs/day: 1.00    Years: 43.00    Total pack years: 43.00    Types: Cigarettes   Smokeless tobacco: Never  Vaping Use   Vaping Use: Never used  Substance Use Topics   Alcohol use: Yes    Alcohol/week: 8.0 standard drinks of alcohol    Types: 8 Cans of beer per week    Comment: beer   Drug use: Never    Family Medical History: Family History  Problem Relation Age of Onset   Heart disease Mother    Osteoporosis Mother    Heart disease Father    Emphysema Father    Heart disease Sister    Emphysema Maternal Grandmother    Heart disease Maternal Grandfather    Osteoporosis Sister     Physical Examination: Vitals:   07/18/22 1556  BP: (!) 181/106  Pulse: 81    General: Patient is well developed, well nourished, calm, collected, and in no apparent distress. Attention to examination is appropriate.  Neck:   Supple.  Full range of motion.  Respiratory: Patient is breathing without any difficulty.   NEUROLOGICAL:     Awake, alert, oriented to person, place, and time.  Speech is clear and fluent. Fund of knowledge is appropriate.   Cranial Nerves: Pupils equal round and reactive to light.  Facial tone is symmetric.  Facial sensation is symmetric. Shoulder shrug is symmetric. Tongue protrusion is midline.  There is no pronator drift.  ROM of spine: full.    Strength: Side Biceps Triceps Deltoid Interossei Grip Wrist Ext. Wrist Flex.  R '5 5 5 5 5 5 5  '$ L '5 5 5 5 5 5 5   '$ Side Iliopsoas Quads Hamstring PF DF EHL  R '5 5 5 5 5 5  '$ L '5 5 5 5 5 5   '$ Reflexes are 2+ and symmetric at the biceps, triceps, brachioradialis,  patella and achilles.   Hoffman's is absent.  Clonus is not present.  Toes are down-going.  Bilateral upper and lower extremity sensation is intact to light touch except L C6 which is diminished.    No evidence of dysmetria noted.  Gait is normal.     Medical Decision Making  Imaging: MRI C spine 01/04/22 IMPRESSION: 1. At C4-C5, there is a posterior disc osteophyte complex with bilateral uncovertebral ridging and facet arthropathy resulting in severe right and moderate left neural foraminal stenosis and mild spinal canal stenosis. 2. At C5-C6, there is grade 1 retrolisthesis, a prominent posterior disc osteophyte complex eccentric to the left with left worse than right uncovertebral ridging, and bilateral facet arthropathy resulting in severe left and moderate right neural foraminal stenosis and moderate spinal canal stenosis with mass effect on the cord but no cord signal abnormality. 3. Severe  left and moderate right neural foraminal stenosis at C6-C7.     Electronically Signed   By: Valetta Mole M.D.   On: 01/04/2022 11:48  I have personally reviewed the images and agree with the above interpretation.  Assessment and Plan: Mr. Peixoto is a pleasant 61 y.o. male with stenosis and possible myelopathy.  Unfortunately, he has had symptoms that are worrisome for possible ICU issues.  His primary has ordered an MRI scan of the brain and ultrasound of his carotid artery.  I think these should be evaluated before considering surgical intervention.  If he is cleared, we will consider C5-6 anterior cervical discectomy and fusion.  I will check with the patient after his studies are resulted.  I have also instructed him to work on his blood pressure control.   I spent a total of 15 minutes in face-to-face and non-face-to-face activities related to this patient's care today.  Thank you for involving me in the care of this patient.      Jahki Witham K. Izora Ribas MD,  [facility] Neurosurgery

## 2022-07-18 NOTE — Assessment & Plan Note (Signed)
Continue collaboration with vascular on yearly basis, did not attend scheduled 02/01/22 visit. Recommend he reschedule.  Will repeat carotid duplex to further assess due to ongoing dizziness and recent numbness episode, would like to perform STAT, but patient reports he can not attend today or tomorrow due to schedule.  Have recommend if any repeat of numbness episode noted he is to immediately go to ER for assessment.

## 2022-07-18 NOTE — Assessment & Plan Note (Signed)
Chronic, ongoing with BP remaining stable on recheck today. Continue current medication regimen as prescribed by Dr. Smith Mince at [facility], will take over these refills and have him return to them as needed.  Continued alcohol use but is cutting back.  Recommend cutting back on alcohol and smoking -- work towards complete cessation.  Recommend he monitor BP at least a few mornings a week at home and document.  DASH diet at home.  Labs today: CBC and CMP.

## 2022-07-19 ENCOUNTER — Encounter: Payer: Self-pay | Admitting: Nurse Practitioner

## 2022-07-19 LAB — GAMMA GT: GGT: 65 IU/L (ref 0–65)

## 2022-07-19 LAB — COMPREHENSIVE METABOLIC PANEL
ALT: 16 IU/L (ref 0–44)
AST: 21 IU/L (ref 0–40)
Albumin/Globulin Ratio: 1.6 (ref 1.2–2.2)
Albumin: 4.5 g/dL (ref 3.8–4.9)
Alkaline Phosphatase: 84 IU/L (ref 44–121)
BUN/Creatinine Ratio: 14 (ref 9–20)
BUN: 12 mg/dL (ref 6–24)
Bilirubin Total: 0.7 mg/dL (ref 0.0–1.2)
CO2: 22 mmol/L (ref 20–29)
Calcium: 9.7 mg/dL (ref 8.7–10.2)
Chloride: 98 mmol/L (ref 96–106)
Creatinine, Ser: 0.85 mg/dL (ref 0.76–1.27)
Globulin, Total: 2.9 g/dL (ref 1.5–4.5)
Glucose: 86 mg/dL (ref 70–99)
Potassium: 4.3 mmol/L (ref 3.5–5.2)
Sodium: 135 mmol/L (ref 134–144)
Total Protein: 7.4 g/dL (ref 6.0–8.5)
eGFR: 100 mL/min/{1.73_m2} (ref >59)

## 2022-07-19 LAB — CBC WITH DIFFERENTIAL/PLATELET
Basophils Absolute: 0.1 10*3/uL (ref 0.0–0.2)
Basos: 1 %
EOS (ABSOLUTE): 0.2 10*3/uL (ref 0.0–0.4)
Eos: 2 %
Hematocrit: 45.4 % (ref 37.5–51.0)
Hemoglobin: 15.2 g/dL (ref 13.0–17.7)
Immature Grans (Abs): 0 10*3/uL (ref 0.0–0.1)
Immature Granulocytes: 0 %
Lymphocytes Absolute: 2.3 10*3/uL (ref 0.7–3.1)
Lymphs: 23 %
MCH: 31.6 pg (ref 26.6–33.0)
MCHC: 33.5 g/dL (ref 31.5–35.7)
MCV: 94 fL (ref 79–97)
Monocytes Absolute: 0.7 10*3/uL (ref 0.1–0.9)
Monocytes: 7 %
Neutrophils Absolute: 7 10*3/uL (ref 1.4–7.0)
Neutrophils: 67 %
Platelets: 337 10*3/uL (ref 150–450)
RBC: 4.81 x10E6/uL (ref 4.14–5.80)
RDW: 13 % (ref 11.6–15.4)
WBC: 10.2 10*3/uL (ref 3.4–10.8)

## 2022-07-19 LAB — IRON AND TIBC
Iron Saturation: 21 % (ref 15–55)
Iron: 76 ug/dL (ref 38–169)
Total Iron Binding Capacity: 367 ug/dL (ref 250–450)
UIBC: 291 ug/dL (ref 111–343)

## 2022-07-19 LAB — VITAMIN D 25 HYDROXY (VIT D DEFICIENCY, FRACTURES): Vit D, 25-Hydroxy: 29.3 ng/mL — ABNORMAL LOW (ref 30.0–100.0)

## 2022-07-19 LAB — FERRITIN: Ferritin: 118 ng/mL (ref 30–400)

## 2022-07-19 LAB — VITAMIN B12: Vitamin B-12: 723 pg/mL (ref 232–1245)

## 2022-07-19 NOTE — Progress Notes (Signed)
Contacted via [place] afternoon Luke Briggs, your labs have returned: - Vitamin D is improving but still a little on low side.  This is good for bone health.  Please ensure you are taking Vitamin D 2000 units daily, which you can get over the counter -- no name brand is fine. - Your anemia from 6 months ago remains much improved!! = CBC and iron level - Kidney function, creatinine and eGFR, remains normal, as is liver function, AST and ALT.   - Ferritin and GGT are still pending.  Overall though you have great labs!! Keep being amazing!!  Thank you for allowing me to participate in your care.  I appreciate you. Kindest regards, Deke Tilghman

## 2022-07-25 ENCOUNTER — Telehealth: Payer: Self-pay | Admitting: Nurse Practitioner

## 2022-07-25 MED ORDER — LORAZEPAM 1 MG PO TABS: ORAL_TABLET | ORAL | 0 refills | Status: DC

## 2022-07-25 NOTE — Telephone Encounter (Signed)
Pt is having a MRI on Monday Sept 11.  Pt states he needs a med to calm him down due to being claustrophobic. Pt states last time he had to have MRI he was prescribed 2 pills that helped, but he doesn't remember what it was.  [facility], [place] - [address]

## 2022-07-29 ENCOUNTER — Other Ambulatory Visit: Payer: Self-pay | Admitting: Nurse Practitioner

## 2022-07-29 ENCOUNTER — Ambulatory Visit
Admission: RE | Admit: 2022-07-29 | Discharge: 2022-07-29 | Disposition: A | Discharge status: Home / Self Care | Payer: Medicaid Other | Source: Ambulatory Visit | Attending: Nurse Practitioner | Admitting: Nurse Practitioner

## 2022-07-29 DIAGNOSIS — Z8673 Personal history of transient ischemic attack (TIA), and cerebral infarction without residual deficits: Secondary | ICD-10-CM | POA: Diagnosis not present

## 2022-07-29 DIAGNOSIS — R42 Dizziness and giddiness: Secondary | ICD-10-CM

## 2022-07-29 DIAGNOSIS — I6523 Occlusion and stenosis of bilateral carotid arteries: Secondary | ICD-10-CM

## 2022-07-29 MED ORDER — GADOBUTROL 1 MMOL/ML IV SOLN
9.0000 mL | Freq: Once | INTRAVENOUS | Status: AC | PRN
  Administered 2022-07-29: 9 mL via INTRAVENOUS

## 2022-07-29 NOTE — Progress Notes (Signed)
Contacted via MyChart   Good evening Dearl, your carotid doppler has returned -- further assessing due to ongoing dizziness.  I would like you to call vascular to be seen, has not seen Dr. Delana Meyer since March 2022.  I will placed new referral too.  There is some progression of plaque build up to left side which would benefit a return to vascular, right side remains unchanged.  Any questions?  Their number to call is [phone]

## 2022-07-30 NOTE — Progress Notes (Signed)
Contacted via [place] afternoon Luke Briggs, your MRI has returned and is showing no new stroke areas.  There is the chronic area of stroke from past, but no acute changes.  Any questions?

## 2022-07-31 ENCOUNTER — Telehealth: Payer: Self-pay

## 2022-07-31 NOTE — Telephone Encounter (Signed)
-----   Message from Meade Maw, MD sent at 07/31/2022 10:02 AM EDT ----- He is clear to proceed with neck surgery per Dr. Delana Meyer based on his carotid ultrasound.  Please let him know we'll contact him on Friday to figure out dates  ----- Message ----- From: Meade Maw, MD Sent: 07/31/2022   7:00 AM EDT To: Meade Maw, MD  Check imaging

## 2022-08-01 ENCOUNTER — Encounter: Payer: Self-pay | Admitting: Neurosurgery

## 2022-08-01 ENCOUNTER — Other Ambulatory Visit: Payer: Self-pay

## 2022-08-01 DIAGNOSIS — Z8614 Personal history of Methicillin resistant Staphylococcus aureus infection: Secondary | ICD-10-CM

## 2022-08-01 DIAGNOSIS — Z01818 Encounter for other preprocedural examination: Secondary | ICD-10-CM

## 2022-08-01 NOTE — Progress Notes (Signed)
Letters written for [facility] approval for surgery

## 2022-08-02 ENCOUNTER — Other Ambulatory Visit
Admission: RE | Admit: 2022-08-02 | Discharge: 2022-08-02 | Disposition: A | Discharge status: Home / Self Care | Payer: Medicaid Other | Attending: Pulmonary Disease | Admitting: Pulmonary Disease

## 2022-08-02 ENCOUNTER — Ambulatory Visit: Payer: Medicaid Other | Admitting: Pulmonary Disease

## 2022-08-02 ENCOUNTER — Encounter: Payer: Self-pay | Admitting: Pulmonary Disease

## 2022-08-02 VITALS — BP 160/100 | HR 76 | Temp 97.8°F | Ht 70.0 in | Wt 200.6 lb

## 2022-08-02 DIAGNOSIS — Z716 Tobacco abuse counseling: Secondary | ICD-10-CM

## 2022-08-02 DIAGNOSIS — J449 Chronic obstructive pulmonary disease, unspecified: Secondary | ICD-10-CM | POA: Insufficient documentation

## 2022-08-02 MED ORDER — AZITHROMYCIN 250 MG PO TABS: ORAL_TABLET | ORAL | 0 refills | Status: AC

## 2022-08-02 MED ORDER — PREDNISONE 10 MG PO TABS: ORAL_TABLET | ORAL | 0 refills | Status: AC

## 2022-08-02 NOTE — Progress Notes (Signed)
[facility]  Chief Complaint  Patient presents with   pulmonary consult    Hx of emphysema. C/o SOB with exertion, dizziness, weakness, wheezing and prod cough with black sputum.     Past Surgical History:  He  has a past surgical history that includes Eye surgery (Left); Foot surgery (Left); Cataract extraction w/PHACO (Left, 03/28/2020); Colonoscopy with propofol (N/A, 12/14/2020); and Esophagogastroduodenoscopy (egd) with propofol (N/A, 10/18/2021).  Past Medical History:  ACE inhibitor angioedema, Arthritis, GERD, Hiatal hernia, HTN, PNA, PTSD, Schizophrenia, BPH, Cervical stenosis, CVA  Constitutional:  BP (!) 160/100 (BP Location: Left Arm, Cuff Size: Normal)   Pulse 76   Temp 97.8 F (36.6 C) (Temporal)   Ht '5\' 10"'$  (1.778 m)   Wt 200 lb 9.6 oz (91 kg)   SpO2 98%   BMI 28.78 kg/m   Brief Summary:  Luke Hyndman. is a 61 y.o. male smoker with emphysema and chronic bronchitis.      Subjective:   He has smoked cigarettes since he was 61 years old.  He smokes 1.5 to 2 packs per day.  He has lung cancer screening CT chest.  This showed emphysema.  He tried doing spirometry in July, but said he had terrible cough while doing this.  He feels like his breathing is getting worse.  He can't walk more than 100 feet without having to rest.  He gets pain in his chest from cough.  He brings up lots of phlegm.  He doesn't look at the phlegm color.  He has coughed up blood before, but not recently.  He gets wheezing in his chest at times.  He denies fever, sweats, weight loss, hoarseness, sinus congestion, or leg swelling.  He wakes up feeling like he can't breath sometimes when he is asleep and snores sometimes.  His father died in his mid-50's from COPD.  He remembers his uncle needed supplemental oxygen before he died.  He is from [place] originally, but has lived in many locations.  He worked as a Games developer.  He has pet cats and dogs.   He had pneumonia several years ago.  No history of tuberculosis or COVID infection.  He was never in the [employer].  He maintained SpO2 on room air at 97% or higher while walking in office today.  Physical Exam:   Appearance - well kempt   ENMT - no sinus tenderness, no oral exudate, no LAN, Mallampati 3 airway, no stridor  Respiratory - decreased breath sounds bilaterally, no wheezing or rales  CV - s1s2 regular rate and rhythm, no murmurs  Ext - no clubbing, no edema  Skin - no rashes  Psych - normal mood and affect   Pulmonary testing:  Spirometry 05/28/22 >> FEV1 2.56 (70%), FEV1% 79  Chest Imaging:  LDCT chest 10/24/21 >> centrilobular and paraseptal emphysema, scattered tiny nodules  Sleep Tests:    Cardiac Tests:  Echo 11/18/19 >> EF 60 to 65%  Social History:  He  reports that he has been smoking cigarettes. He has a 107.50 pack-year smoking history. He has never used smokeless tobacco. He reports current alcohol use of about 8.0 standard drinks of alcohol per week. He reports that he does not use drugs.  Family History:  His family history includes Emphysema in his father and maternal grandmother; Heart disease in his father, maternal grandfather, mother, and sister; Osteoporosis in his mother and sister.     Labs:  Latest Ref Rng & Units 07/18/2022    9:02 AM 05/28/2022   10:13 AM 01/14/2022    2:56 PM  CMP  Glucose 70 - 99 mg/dL 86  95  74   BUN 6 - 24 mg/dL '12  16  14   '$ Creatinine 0.76 - 1.27 mg/dL 0.85  0.96  0.92   Sodium 134 - 144 mmol/L 135  132  137   Potassium 3.5 - 5.2 mmol/L 4.3  4.2  4.2   Chloride 96 - 106 mmol/L 98  93  99   CO2 20 - 29 mmol/L '22  22  25   '$ Calcium 8.7 - 10.2 mg/dL 9.7  9.4  9.5   Total Protein 6.0 - 8.5 g/dL 7.4  7.2  7.4   Total Bilirubin 0.0 - 1.2 mg/dL 0.7  0.5  0.4   Alkaline Phos 44 - 121 IU/L 84  80  109   AST 0 - 40 IU/L '21  20  26   '$ ALT 0 - 44 IU/L '16  18  23        '$ Latest Ref Rng & Units 07/18/2022    9:02  AM 05/28/2022   10:13 AM 02/01/2022    9:50 AM  CBC  WBC 3.4 - 10.8 x10E3/uL 10.2  9.0  9.7   Hemoglobin 13.0 - 17.7 g/dL 15.2  13.7  14.6   Hematocrit 37.5 - 51.0 % 45.4  40.5  43.4   Platelets 150 - 450 x10E3/uL 337  359  362     Assessment/Plan:   Emphysema with chronic bronchitis. - continue breztri; will arrange for a spacer device - prn albuterol - will give course of prednisone and zithromax - will arrange for PFT and alpha 1 antitrypsin testing - will arrange for overnight oxygen test on room air and then determine if he needs additional sleep testing  Tobacco abuse. - discussed several options for smoking cessation - will try to gradually quit, and might consider electronic cigarettes  Hypertension. - blood pressure remains elevated today - he has been seen by nephrology and cardiology with [facility] in [place]  Time Spent Involved in Patient Care on Day of Examination:  51 minutes  Follow up:   Patient Instructions  Zithromax 250 mg pill >> 2 pills on day 1, then 1 pill daily  Prednisone 10 mg pill >> 3 pills daily for 2 days, 2 pills daily for 2 days, 1 pill daily for 2 days  Continue breztri two puffs in the morning and two puffs in then evening, and rinse your mouth after each use.  Will arrange for a spacer device to use with breztri.  Will arrange for pulmonary function test and overnight oxygen test.  Lab test today.  Follow up in 8 weeks with Dr. Halford Chessman or a Nurse Practitioner.  Medication List:   Allergies as of 08/02/2022       Reactions   Lisinopril Swelling   Reaction: angioedema   Iodinated Contrast Media Hives   Amlodipine    Dizziness and edema with this   Penicillins Rash   Reaction: Feels like skin is on fire Did it involve swelling of the face/tongue/throat, SOB, or low BP? No Did it involve sudden or severe rash/hives, skin peeling, or any reaction on the inside of your mouth or nose? No Did you need to seek medical attention at a  hospital or doctor's office? Yes When did it last happen? 30 years ago If all above answers are "NO", may  proceed with cephalosporin use.        Medication List        Accurate as of August 02, 2022  9:46 AM. If you have any questions, ask your nurse or doctor.          albuterol 108 (90 Base) MCG/ACT inhaler Commonly known as: VENTOLIN HFA Inhale 2 puffs into the lungs every 6 (six) hours as needed for wheezing orshortness of breath.   atorvastatin 10 MG tablet Commonly known as: LIPITOR Take 1 tablet (10 mg total) by mouth daily.   azithromycin 250 MG tablet Commonly known as: ZITHROMAX Take 2 tablets (500 mg total) by mouth daily for 1 day, THEN 1 tablet (250 mg total) daily for 4 days. Start taking on: August 02, 2022 Started by: Chesley Mires, MD   Arnell Sieving [phone] MCG/ACT Aero Generic drug: Budeson-Glycopyrrol-Formoterol Inhale 2 puffs into the lungs in the morning and at bedtime.   EPINEPHrine 0.3 mg/0.3 mL Soaj injection Commonly known as: EPI-PEN Inject 0.3 mLs (0.3 mg total) into the muscle as needed for anaphylaxis.   finasteride 5 MG tablet Commonly known as: PROSCAR Take 1 tablet (5 mg total) by mouth daily.   Iron-Vitamin C 65-125 MG Tabs Take 1 tablet by mouth daily.   LORazepam 1 MG tablet Commonly known as: ATIVAN Take one pill (1 MG) 30 minutes prior to MRI as needed, may repeat dose once 15 minutes prior to MRI if needed.   montelukast 10 MG tablet Commonly known as: SINGULAIR Take 10 mg by mouth at bedtime.   olmesartan-hydrochlorothiazide 20-12.5 MG tablet Commonly known as: BENICAR HCT Take 1 tablet by mouth daily.   omeprazole 40 MG capsule Commonly known as: PRILOSEC Take 1 capsule (40 mg total) by mouth in the morning and at bedtime.   predniSONE 10 MG tablet Commonly known as: DELTASONE Take 3 tablets (30 mg total) by mouth daily with breakfast for 2 days, THEN 2 tablets (20 mg total) daily with breakfast for 2  days, THEN 1 tablet (10 mg total) daily with breakfast for 2 days. Start taking on: August 02, 2022 Started by: Chesley Mires, MD   tadalafil 5 MG tablet Commonly known as: CIALIS TAKE ONE TABLET BY MOUTH DAILY AS NEEDED FOR ERECTILE DYSFUNCTION   tamsulosin 0.4 MG Caps capsule Commonly known as: FLOMAX Take 1 capsule (0.4 mg total) by mouth daily.   Vitamin D3 25 MCG (1000 UT) Caps Take 2 capsules (2,000 Units total) by mouth daily.        Signature:  Chesley Mires, MD [place] Pager - [phone] 08/02/2022, 9:46 AM

## 2022-08-02 NOTE — Addendum Note (Signed)
Addended by: Chesley Mires on: 08/02/2022 11:19 AM   Modules accepted: Orders

## 2022-08-02 NOTE — Patient Instructions (Signed)
Zithromax 250 mg pill >> 2 pills on day 1, then 1 pill daily  Prednisone 10 mg pill >> 3 pills daily for 2 days, 2 pills daily for 2 days, 1 pill daily for 2 days  Continue breztri two puffs in the morning and two puffs in then evening, and rinse your mouth after each use.  Will arrange for a spacer device to use with breztri.  Will arrange for pulmonary function test and overnight oxygen test.  Lab test today.  Follow up in 8 weeks with Dr. Halford Chessman or a Nurse Practitioner.

## 2022-08-06 LAB — ALPHA-1-ANTITRYPSIN PHENOTYP: A-1 Antitrypsin, Ser: 133 mg/dL (ref 101–187)

## 2022-08-07 ENCOUNTER — Encounter: Payer: Self-pay | Admitting: Pulmonary Disease

## 2022-08-07 NOTE — Telephone Encounter (Signed)
Dr Halford Chessman, please advise on labs   [name]. to [facility] Clinical Pool (supporting Chesley Mires, MD)       08/07/22 10:02 AM I don't understand any of this? Please explain in [place] terms.[name]

## 2022-08-08 ENCOUNTER — Ambulatory Visit: Payer: Medicaid Other | Admitting: Neurosurgery

## 2022-08-13 ENCOUNTER — Encounter
Admission: RE | Admit: 2022-08-13 | Discharge: 2022-08-13 | Disposition: A | Discharge status: Home / Self Care | Payer: Medicaid Other | Source: Ambulatory Visit | Attending: Neurosurgery | Admitting: Neurosurgery

## 2022-08-13 ENCOUNTER — Encounter: Payer: Self-pay | Admitting: Nurse Practitioner

## 2022-08-13 ENCOUNTER — Other Ambulatory Visit: Payer: Self-pay

## 2022-08-13 VITALS — BP 160/92 | HR 80 | Resp 18 | Wt 195.5 lb

## 2022-08-13 DIAGNOSIS — Z59819 Housing instability, housed unspecified: Secondary | ICD-10-CM

## 2022-08-13 DIAGNOSIS — Z01818 Encounter for other preprocedural examination: Secondary | ICD-10-CM | POA: Insufficient documentation

## 2022-08-13 DIAGNOSIS — Z01812 Encounter for preprocedural laboratory examination: Secondary | ICD-10-CM

## 2022-08-13 HISTORY — DX: Headache, unspecified: R51.9

## 2022-08-13 LAB — CBC
HCT: 43.3 % (ref 39.0–52.0)
Hemoglobin: 14.6 g/dL (ref 13.0–17.0)
MCH: 30.6 pg (ref 26.0–34.0)
MCHC: 33.7 g/dL (ref 30.0–36.0)
MCV: 90.8 fL (ref 80.0–100.0)
Platelets: 350 10*3/uL (ref 150–400)
RBC: 4.77 MIL/uL (ref 4.22–5.81)
RDW: 12.7 % (ref 11.5–15.5)
WBC: 13.1 10*3/uL — ABNORMAL HIGH (ref 4.0–10.5)
nRBC: 0 % (ref 0.0–0.2)

## 2022-08-13 LAB — BASIC METABOLIC PANEL
Anion gap: 10 (ref 5–15)
BUN: 14 mg/dL (ref 6–20)
CO2: 26 mmol/L (ref 22–32)
Calcium: 9.1 mg/dL (ref 8.9–10.3)
Chloride: 96 mmol/L — ABNORMAL LOW (ref 98–111)
Creatinine, Ser: 0.9 mg/dL (ref 0.61–1.24)
GFR, Estimated: >60 mL/min (ref >60)
Glucose, Bld: 110 mg/dL — ABNORMAL HIGH (ref 70–99)
Potassium: 3.3 mmol/L — ABNORMAL LOW (ref 3.5–5.1)
Sodium: 132 mmol/L — ABNORMAL LOW (ref 135–145)

## 2022-08-13 LAB — TYPE AND SCREEN
ABO/RH(D): O NEG
Antibody Screen: NEGATIVE

## 2022-08-13 LAB — SURGICAL PCR SCREEN
MRSA, PCR: NEGATIVE
Staphylococcus aureus: NEGATIVE

## 2022-08-13 NOTE — Patient Instructions (Addendum)
Your procedure is scheduled on: 08/21/22 - Wednesday Report to the Registration Desk on the 1st floor of the [facility]. To find out your arrival time, please call [phone] between 1PM - 3PM on: 08/20/22 - Tuesday If your arrival time is 6:00 am, do not arrive prior to that time as the [facility] entrance doors do not open until 6:00 am.  REMEMBER: Instructions that are not followed completely may result in serious medical risk, up to and including death; or upon the discretion of your surgeon and anesthesiologist your surgery may need to be rescheduled.  Do not eat food after midnight the night before surgery.  No gum chewing, lozengers or hard candies.  You may however, drink CLEAR liquids up to 2 hours before you are scheduled to arrive for your surgery. Do not drink anything within 2 hours of your scheduled arrival time.  Clear liquids include: - water  - apple juice without pulp - gatorade (not RED colors) - black coffee or tea (Do NOT add milk or creamers to the coffee or tea) Do NOT drink anything that is not on this list.   TAKE ONLY THESE MEDICATIONS THE MORNING OF SURGERY WITH A SIP OF WATER:  - atorvastatin (LIPITOR)  - BREZTRI AEROSPHERE - finasteride (PROSCAR)  - tamsulosin (FLOMAX)  - omeprazole (PRILOSEC) - (take one the night before and one on the morning of surgery - helps to prevent nausea after surgery.)  Use inhaler albuterol (VENTOLIN)  on the day of surgery and bring to the hospital.  One week prior to surgery: Stop Anti-inflammatories (NSAIDS) such as Advil, Aleve, Ibuprofen, Motrin, Naproxen, Naprosyn and Aspirin based products such as Excedrin, Goodys Powder, BC Powder.  Stop ANY OVER THE COUNTER supplements until after surgery.  You may however, continue to take Tylenol if needed for pain up until the day of surgery.  No Alcohol for 24 hours before or after surgery.  No Smoking including e-cigarettes for 24 hours prior to surgery.  No  chewable tobacco products for at least 6 hours prior to surgery.  No nicotine patches on the day of surgery.  Do not use any "recreational" drugs for at least a week prior to your surgery.  Please be advised that the combination of cocaine and anesthesia may have negative outcomes, up to and including death. If you test positive for cocaine, your surgery will be cancelled.  On the morning of surgery brush your teeth with toothpaste and water, you may rinse your mouth with mouthwash if you wish. Do not swallow any toothpaste or mouthwash.  Use CHG Soap or wipes as directed on instruction sheet.  Do not wear jewelry, make-up, hairpins, clips or nail polish.  Do not wear lotions, powders, or perfumes.   Do not shave body from the neck down 48 hours prior to surgery just in case you cut yourself which could leave a site for infection.  Also, freshly shaved skin may become irritated if using the CHG soap.  Contact lenses, hearing aids and dentures may not be worn into surgery.  Do not bring valuables to the hospital. [facility] is not responsible for any missing/lost belongings or valuables.   Notify your doctor if there is any change in your medical condition (cold, fever, infection).  Wear comfortable clothing (specific to your surgery type) to the hospital.  After surgery, you can help prevent lung complications by doing breathing exercises.  Take deep breaths and cough every 1-2 hours. Your doctor may order a  device called an Incentive Spirometer to help you take deep breaths. When coughing or sneezing, hold a pillow firmly against your incision with both hands. This is called "splinting." Doing this helps protect your incision. It also decreases belly discomfort.  If you are being admitted to the hospital overnight, leave your suitcase in the car. After surgery it may be brought to your room.  If you are being discharged the day of surgery, you will not be allowed to drive  home. You will need a responsible adult (18 years or older) to drive you home and stay with you that night.   If you are taking public transportation, you will need to have a responsible adult (18 years or older) with you. Please confirm with your physician that it is acceptable to use public transportation.   Please call the [facility] Dept. at [phone] if you have any questions about these instructions.  Surgery Visitation Policy:  Patients undergoing a surgery or procedure may have two family members or support persons with them as long as the person is not COVID-19 positive or experiencing its symptoms.   Inpatient Visitation:    Visiting hours are 7 a.m. to 8 p.m. Up to four visitors are allowed at one time in a patient room, including children. The visitors may rotate out with other people during the day. One designated support person (adult) may remain overnight.

## 2022-08-15 ENCOUNTER — Ambulatory Visit: Payer: Medicaid Other | Admitting: Licensed Clinical Social Worker

## 2022-08-15 NOTE — Patient Outreach (Signed)
  Care Coordination   08/15/2022 Name: Luke Briggs. MRN: [id number] DOB: 04/29/1961   Care Coordination Outreach Attempts:  An unsuccessful telephone outreach was attempted today to offer the patient information about available care coordination services as a benefit of their health plan.   Follow Up Plan:  Additional outreach attempts will be made to offer the patient care coordination information and services.   Encounter Outcome:  No Answer  Care Coordination Interventions Activated:  No   Care Coordination Interventions:  No, not indicated    Lenor Derrick, MSW  Social Worker [facility] Care Management  [phone]

## 2022-08-19 ENCOUNTER — Telehealth: Payer: Self-pay

## 2022-08-19 NOTE — Chronic Care Management (AMB) (Signed)
  Care Coordination   Note   08/19/2022 Name: Luke Briggs. MRN: [id number] DOB: Jun 24, 1961  Luke Briggs. is a 61 y.o. year old male who sees [name], [name], NP for primary care. I reached out to [employer]. by phone today to offer care coordination services.  Mr. Brod was given information about Care Coordination services today including:   The Care Coordination services include support from the care team which includes your Nurse Coordinator, Clinical Social Worker, or Pharmacist.  The Care Coordination team is here to help remove barriers to the health concerns and goals most important to you. Care Coordination services are voluntary, and the patient may decline or stop services at any time by request to their care team member.   Care Coordination Consent Status: Patient agreed to services and verbal consent obtained.   Follow up plan:  Telephone appointment with care coordination team member scheduled for:  LCSW 08/23/2022 RNCM 08/26/2022  Encounter Outcome:  Pt. Scheduled  Luke Briggs, Luke Briggs, [place] [address] Direct Dial: [phone] [email or web address]

## 2022-08-20 ENCOUNTER — Telehealth: Payer: Self-pay | Admitting: Nurse Practitioner

## 2022-08-20 NOTE — Telephone Encounter (Signed)
Noted  

## 2022-08-20 NOTE — Telephone Encounter (Signed)
Copied from [place] [id number]. Topic: General - Other >> Aug 20, 2022  9:31 AM Cyndi Bender wrote: Reason for CRM: Pt stated he has been waiting for clearance to have neck surgery but Medicaid has not received the clearance. Pt stated he has surgery scheduled for tomorrow 08/21/22 so the clearance needs to be sent immediately.

## 2022-08-20 NOTE — Telephone Encounter (Signed)
Called and spoke with patient to gather more information in regards to his call. Patient says he was advised to reach out to someone in regards to his recent imaging and ultrasound. Patient says he was told Medicaid has to approve his surgery. Patient was advised to reach out to his surgeon's office and Medicaid, as he does not need clearance, but authorization for his surgery. Patient verbalized understanding and was told to reach out to our office if he has any other questions.

## 2022-08-21 ENCOUNTER — Ambulatory Visit: Payer: Medicaid Other | Admitting: General Practice

## 2022-08-21 ENCOUNTER — Ambulatory Visit: Payer: Medicaid Other

## 2022-08-21 ENCOUNTER — Other Ambulatory Visit: Payer: Self-pay

## 2022-08-21 ENCOUNTER — Encounter: Payer: Self-pay | Admitting: Neurosurgery

## 2022-08-21 ENCOUNTER — Observation Stay
Admission: RE | Admit: 2022-08-21 | Discharge: 2022-08-22 | Disposition: A | Discharge status: Home / Self Care | Payer: Medicaid Other | Attending: Neurosurgery | Admitting: Neurosurgery

## 2022-08-21 ENCOUNTER — Encounter
Admission: RE | Disposition: A | Discharge status: Home / Self Care | Payer: Self-pay | Source: Home / Self Care | Attending: Neurosurgery

## 2022-08-21 DIAGNOSIS — G959 Disease of spinal cord, unspecified: Secondary | ICD-10-CM | POA: Diagnosis present

## 2022-08-21 DIAGNOSIS — F17210 Nicotine dependence, cigarettes, uncomplicated: Secondary | ICD-10-CM | POA: Insufficient documentation

## 2022-08-21 DIAGNOSIS — Z79899 Other long term (current) drug therapy: Secondary | ICD-10-CM | POA: Insufficient documentation

## 2022-08-21 DIAGNOSIS — M50022 Cervical disc disorder at C5-C6 level with myelopathy: Secondary | ICD-10-CM | POA: Diagnosis not present

## 2022-08-21 DIAGNOSIS — J449 Chronic obstructive pulmonary disease, unspecified: Secondary | ICD-10-CM | POA: Diagnosis not present

## 2022-08-21 DIAGNOSIS — I10 Essential (primary) hypertension: Secondary | ICD-10-CM | POA: Insufficient documentation

## 2022-08-21 DIAGNOSIS — Z01818 Encounter for other preprocedural examination: Secondary | ICD-10-CM

## 2022-08-21 DIAGNOSIS — M4802 Spinal stenosis, cervical region: Principal | ICD-10-CM

## 2022-08-21 DIAGNOSIS — Z8614 Personal history of Methicillin resistant Staphylococcus aureus infection: Secondary | ICD-10-CM

## 2022-08-21 HISTORY — PX: ANTERIOR CERVICAL DECOMP/DISCECTOMY FUSION: SHX1161

## 2022-08-21 SURGERY — ANTERIOR CERVICAL DECOMPRESSION/DISCECTOMY FUSION 1 LEVEL
Anesthesia: General | Site: Spine Cervical

## 2022-08-21 MED ORDER — KETOROLAC TROMETHAMINE 15 MG/ML IJ SOLN
15.0000 mg | Freq: Four times a day (QID) | INTRAMUSCULAR | Status: DC
  Administered 2022-08-21 – 2022-08-22 (×3): 15 mg via INTRAVENOUS
  Filled 2022-08-21 (×3): qty 1

## 2022-08-21 MED ORDER — LIDOCAINE HCL (CARDIAC) PF 100 MG/5ML IV SOSY
PREFILLED_SYRINGE | INTRAVENOUS | Status: DC | PRN
  Administered 2022-08-21: 100 mg via INTRAVENOUS

## 2022-08-21 MED ORDER — CEFAZOLIN SODIUM-DEXTROSE 2-4 GM/100ML-% IV SOLN
2.0000 g | Freq: Once | INTRAVENOUS | Status: AC
  Administered 2022-08-21: 2 g via INTRAVENOUS

## 2022-08-21 MED ORDER — SODIUM CHLORIDE 0.9 % IV SOLN: INTRAVENOUS | Status: DC

## 2022-08-21 MED ORDER — EPHEDRINE 5 MG/ML INJ
INTRAVENOUS | Status: AC
  Filled 2022-08-21: qty 5

## 2022-08-21 MED ORDER — BUPIVACAINE-EPINEPHRINE (PF) 0.5% -1:200000 IJ SOLN
INTRAMUSCULAR | Status: DC | PRN
  Administered 2022-08-21: 3 mL

## 2022-08-21 MED ORDER — SODIUM CHLORIDE 0.9 % IV SOLN: 250.0000 mL | INTRAVENOUS | Status: DC

## 2022-08-21 MED ORDER — FENTANYL CITRATE (PF) 100 MCG/2ML IJ SOLN
25.0000 ug | INTRAMUSCULAR | Status: DC | PRN
  Administered 2022-08-21: 25 ug via INTRAVENOUS
  Administered 2022-08-21: 50 ug via INTRAVENOUS
  Administered 2022-08-21 (×2): 25 ug via INTRAVENOUS

## 2022-08-21 MED ORDER — EPHEDRINE SULFATE (PRESSORS) 50 MG/ML IJ SOLN
INTRAMUSCULAR | Status: DC | PRN
  Administered 2022-08-21: 5 mg via INTRAVENOUS

## 2022-08-21 MED ORDER — PANTOPRAZOLE SODIUM 40 MG PO TBEC
80.0000 mg | DELAYED_RELEASE_TABLET | Freq: Every day | ORAL | Status: DC
  Administered 2022-08-22: 80 mg via ORAL
  Filled 2022-08-21: qty 2

## 2022-08-21 MED ORDER — OXYCODONE HCL 5 MG PO TABS
ORAL_TABLET | ORAL | Status: AC
  Filled 2022-08-21: qty 1

## 2022-08-21 MED ORDER — GLYCOPYRROLATE 0.2 MG/ML IJ SOLN
INTRAMUSCULAR | Status: AC
  Filled 2022-08-21: qty 1

## 2022-08-21 MED ORDER — ENOXAPARIN SODIUM 40 MG/0.4ML IJ SOSY
40.0000 mg | PREFILLED_SYRINGE | INTRAMUSCULAR | Status: DC
  Administered 2022-08-22: 40 mg via SUBCUTANEOUS
  Filled 2022-08-21: qty 0.4

## 2022-08-21 MED ORDER — METHOCARBAMOL 1000 MG/10ML IJ SOLN
500.0000 mg | Freq: Four times a day (QID) | INTRAVENOUS | Status: DC | PRN
  Administered 2022-08-21: 500 mg via INTRAVENOUS
  Filled 2022-08-21: qty 5

## 2022-08-21 MED ORDER — METHOCARBAMOL 500 MG PO TABS
500.0000 mg | ORAL_TABLET | Freq: Four times a day (QID) | ORAL | Status: DC | PRN
  Administered 2022-08-21: 500 mg via ORAL
  Filled 2022-08-21: qty 1

## 2022-08-21 MED ORDER — VANCOMYCIN HCL IN DEXTROSE 1-5 GM/200ML-% IV SOLN: 1000.0000 mg | Freq: Once | INTRAVENOUS | Status: AC

## 2022-08-21 MED ORDER — CHLORHEXIDINE GLUCONATE 0.12 % MT SOLN: 15.0000 mL | Freq: Once | OROMUCOSAL | Status: AC

## 2022-08-21 MED ORDER — OXYCODONE HCL 5 MG/5ML PO SOLN: 5.0000 mg | Freq: Once | ORAL | Status: AC | PRN

## 2022-08-21 MED ORDER — OXYCODONE HCL 5 MG PO TABS
10.0000 mg | ORAL_TABLET | ORAL | Status: DC | PRN
  Administered 2022-08-21 – 2022-08-22 (×3): 10 mg via ORAL
  Filled 2022-08-21 (×3): qty 2

## 2022-08-21 MED ORDER — PHENYLEPHRINE HCL (PRESSORS) 10 MG/ML IV SOLN
INTRAVENOUS | Status: DC | PRN
  Administered 2022-08-21: 80 ug via INTRAVENOUS

## 2022-08-21 MED ORDER — CHLORHEXIDINE GLUCONATE 0.12 % MT SOLN
OROMUCOSAL | Status: AC
  Administered 2022-08-21: 15 mL via OROMUCOSAL
  Filled 2022-08-21: qty 15

## 2022-08-21 MED ORDER — ORAL CARE MOUTH RINSE: 15.0000 mL | Freq: Once | OROMUCOSAL | Status: AC

## 2022-08-21 MED ORDER — FENTANYL CITRATE (PF) 100 MCG/2ML IJ SOLN
INTRAMUSCULAR | Status: AC
  Filled 2022-08-21: qty 2

## 2022-08-21 MED ORDER — FENTANYL CITRATE (PF) 100 MCG/2ML IJ SOLN
INTRAMUSCULAR | Status: AC
  Administered 2022-08-21: 25 ug via INTRAVENOUS
  Filled 2022-08-21: qty 2

## 2022-08-21 MED ORDER — ALBUTEROL SULFATE (2.5 MG/3ML) 0.083% IN NEBU: 3.0000 mL | INHALATION_SOLUTION | Freq: Four times a day (QID) | RESPIRATORY_TRACT | Status: DC | PRN

## 2022-08-21 MED ORDER — MONTELUKAST SODIUM 10 MG PO TABS
10.0000 mg | ORAL_TABLET | Freq: Every day | ORAL | Status: DC
  Administered 2022-08-21: 10 mg via ORAL
  Filled 2022-08-21: qty 1

## 2022-08-21 MED ORDER — SODIUM CHLORIDE 0.9% FLUSH
3.0000 mL | Freq: Two times a day (BID) | INTRAVENOUS | Status: DC
  Administered 2022-08-21: 3 mL via INTRAVENOUS

## 2022-08-21 MED ORDER — SURGIFLO WITH THROMBIN (HEMOSTATIC MATRIX KIT) OPTIME
TOPICAL | Status: DC | PRN
  Administered 2022-08-21: 1 via TOPICAL

## 2022-08-21 MED ORDER — DEXAMETHASONE SODIUM PHOSPHATE 10 MG/ML IJ SOLN
INTRAMUSCULAR | Status: DC | PRN
  Administered 2022-08-21: 10 mg via INTRAVENOUS

## 2022-08-21 MED ORDER — SODIUM CHLORIDE 0.9% FLUSH: 3.0000 mL | INTRAVENOUS | Status: DC | PRN

## 2022-08-21 MED ORDER — SODIUM CHLORIDE (PF) 0.9 % IJ SOLN
INTRAMUSCULAR | Status: AC
  Filled 2022-08-21: qty 20

## 2022-08-21 MED ORDER — ONDANSETRON HCL 4 MG/2ML IJ SOLN: 4.0000 mg | Freq: Four times a day (QID) | INTRAMUSCULAR | Status: DC | PRN

## 2022-08-21 MED ORDER — ONDANSETRON HCL 4 MG/2ML IJ SOLN
INTRAMUSCULAR | Status: AC
  Filled 2022-08-21: qty 2

## 2022-08-21 MED ORDER — REMIFENTANIL HCL 1 MG IV SOLR
INTRAVENOUS | Status: DC | PRN
  Administered 2022-08-21: .1 ug/kg/min via INTRAVENOUS

## 2022-08-21 MED ORDER — LACTATED RINGERS IV SOLN: INTRAVENOUS | Status: DC

## 2022-08-21 MED ORDER — BUDESON-GLYCOPYRROL-FORMOTEROL 160-9-4.8 MCG/ACT IN AERO: 2.0000 | INHALATION_SPRAY | Freq: Two times a day (BID) | RESPIRATORY_TRACT | Status: DC

## 2022-08-21 MED ORDER — 0.9 % SODIUM CHLORIDE (POUR BTL) OPTIME
TOPICAL | Status: DC | PRN
  Administered 2022-08-21: 500 mL

## 2022-08-21 MED ORDER — DEXAMETHASONE SODIUM PHOSPHATE 10 MG/ML IJ SOLN
INTRAMUSCULAR | Status: AC
  Filled 2022-08-21: qty 1

## 2022-08-21 MED ORDER — GLYCOPYRROLATE 0.2 MG/ML IJ SOLN
INTRAMUSCULAR | Status: DC | PRN
  Administered 2022-08-21: .2 mg via INTRAVENOUS

## 2022-08-21 MED ORDER — CEFAZOLIN IN SODIUM CHLORIDE 2-0.9 GM/100ML-% IV SOLN
2.0000 g | Freq: Once | INTRAVENOUS | Status: DC
  Filled 2022-08-21: qty 100

## 2022-08-21 MED ORDER — PROPOFOL 10 MG/ML IV BOLUS
INTRAVENOUS | Status: AC
  Filled 2022-08-21: qty 20

## 2022-08-21 MED ORDER — OXYCODONE HCL 5 MG PO TABS: 5.0000 mg | ORAL_TABLET | ORAL | Status: DC | PRN

## 2022-08-21 MED ORDER — ACETAMINOPHEN 325 MG PO TABS: 650.0000 mg | ORAL_TABLET | ORAL | Status: DC | PRN

## 2022-08-21 MED ORDER — HYDROCHLOROTHIAZIDE 12.5 MG PO TABS
12.5000 mg | ORAL_TABLET | Freq: Every day | ORAL | Status: DC
  Administered 2022-08-21 – 2022-08-22 (×2): 12.5 mg via ORAL
  Filled 2022-08-21 (×2): qty 1

## 2022-08-21 MED ORDER — PROPOFOL 500 MG/50ML IV EMUL
INTRAVENOUS | Status: DC | PRN
  Administered 2022-08-21: 150 ug/kg/min via INTRAVENOUS

## 2022-08-21 MED ORDER — PHENOL 1.4 % MT LIQD: 1.0000 | OROMUCOSAL | Status: DC | PRN

## 2022-08-21 MED ORDER — ONDANSETRON HCL 4 MG PO TABS: 4.0000 mg | ORAL_TABLET | Freq: Four times a day (QID) | ORAL | Status: DC | PRN

## 2022-08-21 MED ORDER — PROPOFOL 1000 MG/100ML IV EMUL
INTRAVENOUS | Status: AC
  Filled 2022-08-21: qty 100

## 2022-08-21 MED ORDER — SODIUM CHLORIDE 0.9 % IV SOLN: INTRAVENOUS | Status: DC | PRN

## 2022-08-21 MED ORDER — MIDAZOLAM HCL 2 MG/2ML IJ SOLN
INTRAMUSCULAR | Status: DC | PRN
  Administered 2022-08-21: 2 mg via INTRAVENOUS

## 2022-08-21 MED ORDER — ACETAMINOPHEN 650 MG RE SUPP: 650.0000 mg | RECTAL | Status: DC | PRN

## 2022-08-21 MED ORDER — CELECOXIB 200 MG PO CAPS
200.0000 mg | ORAL_CAPSULE | Freq: Two times a day (BID) | ORAL | Status: DC
  Administered 2022-08-21 – 2022-08-22 (×2): 200 mg via ORAL
  Filled 2022-08-21 (×2): qty 1

## 2022-08-21 MED ORDER — FENTANYL CITRATE (PF) 100 MCG/2ML IJ SOLN
INTRAMUSCULAR | Status: DC | PRN
  Administered 2022-08-21 (×2): 100 ug via INTRAVENOUS

## 2022-08-21 MED ORDER — SUCCINYLCHOLINE CHLORIDE 200 MG/10ML IV SOSY
PREFILLED_SYRINGE | INTRAVENOUS | Status: DC | PRN
  Administered 2022-08-21: 100 mg via INTRAVENOUS

## 2022-08-21 MED ORDER — MIDAZOLAM HCL 2 MG/2ML IJ SOLN
INTRAMUSCULAR | Status: AC
  Filled 2022-08-21: qty 2

## 2022-08-21 MED ORDER — FLUTICASONE FUROATE-VILANTEROL 200-25 MCG/ACT IN AEPB
1.0000 | INHALATION_SPRAY | Freq: Every day | RESPIRATORY_TRACT | Status: DC
  Administered 2022-08-22: 1 via RESPIRATORY_TRACT
  Filled 2022-08-21: qty 28

## 2022-08-21 MED ORDER — MENTHOL 3 MG MT LOZG: 1.0000 | LOZENGE | OROMUCOSAL | Status: DC | PRN

## 2022-08-21 MED ORDER — IRBESARTAN 150 MG PO TABS
150.0000 mg | ORAL_TABLET | Freq: Every day | ORAL | Status: DC
  Administered 2022-08-21 – 2022-08-22 (×2): 150 mg via ORAL
  Filled 2022-08-21 (×2): qty 1

## 2022-08-21 MED ORDER — CEFAZOLIN SODIUM-DEXTROSE 2-4 GM/100ML-% IV SOLN
INTRAVENOUS | Status: AC
  Filled 2022-08-21: qty 100

## 2022-08-21 MED ORDER — TAMSULOSIN HCL 0.4 MG PO CAPS
0.4000 mg | ORAL_CAPSULE | Freq: Every day | ORAL | Status: DC
  Administered 2022-08-22: 0.4 mg via ORAL
  Filled 2022-08-21: qty 1

## 2022-08-21 MED ORDER — FINASTERIDE 5 MG PO TABS
5.0000 mg | ORAL_TABLET | Freq: Every day | ORAL | Status: DC
  Administered 2022-08-22: 5 mg via ORAL
  Filled 2022-08-21: qty 1

## 2022-08-21 MED ORDER — ONDANSETRON HCL 4 MG/2ML IJ SOLN
INTRAMUSCULAR | Status: DC | PRN
  Administered 2022-08-21 (×2): 4 mg via INTRAVENOUS

## 2022-08-21 MED ORDER — REMIFENTANIL HCL 1 MG IV SOLR
INTRAVENOUS | Status: AC
  Filled 2022-08-21: qty 1000

## 2022-08-21 MED ORDER — OLMESARTAN MEDOXOMIL-HCTZ 20-12.5 MG PO TABS: 1.0000 | ORAL_TABLET | Freq: Every day | ORAL | Status: DC

## 2022-08-21 MED ORDER — SUCCINYLCHOLINE CHLORIDE 200 MG/10ML IV SOSY
PREFILLED_SYRINGE | INTRAVENOUS | Status: AC
  Filled 2022-08-21: qty 10

## 2022-08-21 MED ORDER — ATORVASTATIN CALCIUM 10 MG PO TABS
10.0000 mg | ORAL_TABLET | Freq: Every day | ORAL | Status: DC
  Administered 2022-08-22: 10 mg via ORAL
  Filled 2022-08-21: qty 1

## 2022-08-21 MED ORDER — DEXMEDETOMIDINE HCL IN NACL 200 MCG/50ML IV SOLN
INTRAVENOUS | Status: DC | PRN
  Administered 2022-08-21: 12 ug via INTRAVENOUS

## 2022-08-21 MED ORDER — UMECLIDINIUM BROMIDE 62.5 MCG/ACT IN AEPB
1.0000 | INHALATION_SPRAY | Freq: Every day | RESPIRATORY_TRACT | Status: DC
  Administered 2022-08-22: 1 via RESPIRATORY_TRACT
  Filled 2022-08-21: qty 7

## 2022-08-21 MED ORDER — OXYCODONE HCL 5 MG PO TABS
5.0000 mg | ORAL_TABLET | Freq: Once | ORAL | Status: AC | PRN
  Administered 2022-08-21: 5 mg via ORAL

## 2022-08-21 MED ORDER — PROPOFOL 10 MG/ML IV BOLUS
INTRAVENOUS | Status: DC | PRN
  Administered 2022-08-21: 200 mg via INTRAVENOUS

## 2022-08-21 MED ORDER — SENNA 8.6 MG PO TABS
1.0000 | ORAL_TABLET | Freq: Two times a day (BID) | ORAL | Status: DC
  Administered 2022-08-22: 8.6 mg via ORAL
  Filled 2022-08-21: qty 1

## 2022-08-21 MED ORDER — VANCOMYCIN HCL IN DEXTROSE 1-5 GM/200ML-% IV SOLN
INTRAVENOUS | Status: AC
  Administered 2022-08-21: 1000 mg via INTRAVENOUS
  Filled 2022-08-21: qty 200

## 2022-08-21 MED ORDER — LIDOCAINE HCL (PF) 2 % IJ SOLN
INTRAMUSCULAR | Status: AC
  Filled 2022-08-21: qty 5

## 2022-08-21 MED ORDER — PHENYLEPHRINE HCL-NACL 20-0.9 MG/250ML-% IV SOLN
INTRAVENOUS | Status: DC | PRN
  Administered 2022-08-21: 40 ug/min via INTRAVENOUS

## 2022-08-21 SURGICAL SUPPLY — 55 items
ADH SKN CLS APL DERMABOND .7 (GAUZE/BANDAGES/DRESSINGS) ×1
AGENT HMST KT MTR STRL THRMB (HEMOSTASIS) ×1
ALLOGRAFT BONE FIBER KORE 1CC (Bone Implant) IMPLANT
BASIN KIT SINGLE STR (MISCELLANEOUS) ×1 IMPLANT
BASKET BONE COLLECTION (BASKET) IMPLANT
BULB RESERV EVAC DRAIN JP 100C (MISCELLANEOUS) IMPLANT
BUR NEURO DRILL SOFT 3.0X3.8M (BURR) ×1 IMPLANT
DERMABOND ADVANCED .7 DNX12 (GAUZE/BANDAGES/DRESSINGS) ×1 IMPLANT
DRAIN CHANNEL JP 10F RND 20C F (MISCELLANEOUS) IMPLANT
DRAPE C ARM PK CFD 31 SPINE (DRAPES) ×1 IMPLANT
DRAPE LAPAROTOMY 77X122 PED (DRAPES) ×1 IMPLANT
DRAPE MICROSCOPE SPINE 48X150 (DRAPES) ×1 IMPLANT
DRAPE SURG 17X11 SM STRL (DRAPES) ×1 IMPLANT
ELECT REM PT RETURN 9FT ADLT (ELECTROSURGICAL) ×1
ELECTRODE REM PT RTRN 9FT ADLT (ELECTROSURGICAL) ×1 IMPLANT
FEE INTRAOP CADWELL SUPPLY NCS (MISCELLANEOUS) IMPLANT
FEE INTRAOP MONITOR IMPULS NCS (MISCELLANEOUS) IMPLANT
GLOVE BIOGEL PI IND STRL 6.5 (GLOVE) ×1 IMPLANT
GLOVE BIOGEL PI IND STRL 8.5 (GLOVE) ×1 IMPLANT
GLOVE SURG SYN 6.5 ES PF (GLOVE) ×1 IMPLANT
GLOVE SURG SYN 6.5 PF PI (GLOVE) ×1 IMPLANT
GLOVE SURG SYN 8.5  E (GLOVE) ×3
GLOVE SURG SYN 8.5 E (GLOVE) ×3 IMPLANT
GLOVE SURG SYN 8.5 PF PI (GLOVE) ×3 IMPLANT
GOWN SRG LRG LVL 4 IMPRV REINF (GOWNS) ×1 IMPLANT
GOWN SRG XL LVL 3 NONREINFORCE (GOWNS) ×1 IMPLANT
GOWN STRL NON-REIN TWL XL LVL3 (GOWNS) ×1
GOWN STRL REIN LRG LVL4 (GOWNS) ×1
INTRAOP CADWELL SUPPLY FEE NCS (MISCELLANEOUS) ×1
INTRAOP DISP SUPPLY FEE NCS (MISCELLANEOUS) ×1
INTRAOP MONITOR FEE IMPULS NCS (MISCELLANEOUS) ×1
INTRAOP MONITOR FEE IMPULSE (MISCELLANEOUS) ×1
KIT TURNOVER KIT A (KITS) ×1 IMPLANT
MANIFOLD NEPTUNE II (INSTRUMENTS) ×1 IMPLANT
NDL SAFETY ECLIP 18X1.5 (MISCELLANEOUS) ×1 IMPLANT
NS IRRIG 1000ML POUR BTL (IV SOLUTION) ×1 IMPLANT
NS IRRIG 500ML POUR BTL (IV SOLUTION) IMPLANT
PACK LAMINECTOMY NEURO (CUSTOM PROCEDURE TRAY) ×1 IMPLANT
PAD ARMBOARD 7.5X6 YLW CONV (MISCELLANEOUS) ×2 IMPLANT
PIN CASPAR 14 (PIN) ×1 IMPLANT
PIN CASPAR 14MM (PIN) ×1
PLATE ANT CERV XTEND 1 LV 12 (Plate) IMPLANT
SCREW VAR 4.2 XD SELF DRILL 16 (Screw) IMPLANT
SPACER HEDRON C 12X14X8 7D (Spacer) IMPLANT
SPONGE KITTNER 5P (MISCELLANEOUS) ×1 IMPLANT
STAPLER SKIN PROX 35W (STAPLE) IMPLANT
SURGIFLO W/THROMBIN 8M KIT (HEMOSTASIS) ×1 IMPLANT
SUT DVC VLOC 3-0 CL 6 P-12 (SUTURE) IMPLANT
SUT V-LOC 90 ABS DVC 3-0 CL (SUTURE) ×1 IMPLANT
SUT VIC AB 3-0 SH 8-18 (SUTURE) ×1 IMPLANT
SYR 20ML LL LF (SYRINGE) ×1 IMPLANT
TAPE CLOTH 3X10 WHT NS LF (GAUZE/BANDAGES/DRESSINGS) ×3 IMPLANT
TRAP FLUID SMOKE EVACUATOR (MISCELLANEOUS) ×1 IMPLANT
TRAY FOLEY MTR SLVR 16FR STAT (SET/KITS/TRAYS/PACK) IMPLANT
WATER STERILE IRR 1000ML POUR (IV SOLUTION) ×2 IMPLANT

## 2022-08-21 NOTE — Transfer of Care (Signed)
Immediate Anesthesia Transfer of Care Note  Patient: Luke Briggs.  Procedure(s) Performed: C5-6 ANTERIOR CERVICAL DISCECTOMY AND FUSION (GLOBUS HEDRON) (Spine Cervical)  Patient Location: PACU  Anesthesia Type:General  Level of Consciousness: awake, drowsy and patient cooperative  Airway & Oxygen Therapy: Patient Spontanous Breathing  Post-op Assessment: Report given to RN and Post -op Vital signs reviewed and stable  Post vital signs: Reviewed and stable  Last Vitals:  Vitals Value Taken Time  BP 142/97 08/21/22 1507  Temp 36.1 C 08/21/22 1507  Pulse 92 08/21/22 1512  Resp 16 08/21/22 1512  SpO2 93 % 08/21/22 1512  Vitals shown include unvalidated device data.  Last Pain:  Vitals:   08/21/22 1507  TempSrc:   PainSc: 0-No pain         Complications: No notable events documented.

## 2022-08-21 NOTE — Anesthesia Procedure Notes (Signed)
Procedure Name: Intubation Date/Time: 08/21/2022 1:41 PM  Performed by: Rolla Plate, CRNAPre-anesthesia Checklist: Patient identified, Patient being monitored, Timeout performed, Emergency Drugs available and Suction available Patient Re-evaluated:Patient Re-evaluated prior to induction Oxygen Delivery Method: Circle system utilized Preoxygenation: Pre-oxygenation with 100% oxygen Induction Type: IV induction Ventilation: Mask ventilation without difficulty Laryngoscope Size: Mac, McGraph and 4 Grade View: Grade I Tube type: Oral Tube size: 7.5 mm Number of attempts: 1 Airway Equipment and Method: Stylet and Video-laryngoscopy Placement Confirmation: ETT inserted through vocal cords under direct vision, positive ETCO2 and breath sounds checked- equal and bilateral Secured at: 23 cm Tube secured with: Tape Dental Injury: Teeth and Oropharynx as per pre-operative assessment

## 2022-08-21 NOTE — Op Note (Signed)
Indications: Luke Nydam. 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 he failed conservative management, and elected to proceed with surgery.  Findings: cervical stenosis  Preoperative Diagnosis: Cervical myelopathy, cervical stenosis Postoperative Diagnosis: same   EBL: 25 ml IVF: 500 ml Drains: none Disposition: Extubated and Stable to PACU Complications: none  No foley catheter was placed.   Preoperative Note:   Risks of surgery discussed include: infection, bleeding, stroke, coma, death, paralysis, CSF leak, nerve/spinal cord injury, numbness, tingling, weakness, complex regional pain syndrome, recurrent stenosis and/or disc herniation, vascular injury, development of instability, neck/back pain, need for further surgery, persistent symptoms, development of deformity, and the risks of anesthesia. The patient understood these risks and agreed to proceed.  Operative Note:   Procedure:  1) Anterior cervical diskectomy and fusion at C5-6 2) Anterior cervical instrumentation at C5-6 using Globus Xtend 3) Insertion of biomechanical device at C5-6   Procedure: After obtaining informed consent, the patient taken to the operating room, placed in supine position, general anesthesia induced.  The patient had a small shoulder roll placed behind their shoulders.  The patient received preop antibiotics and IV Decadron.  The patient had a neck incision outlined, was prepped and draped in usual sterile fashion. The incision was injected with local anesthetic.   An incision was opened, dissection taken down medial to the carotid artery and jugular vein, lateral to the trachea and esophagus.  The prevertebral fascia was identified, and a localizing x-ray demonstrated the correct level.  The longus colli were dissected laterally, and self-retaining retractors placed to open the operative field. The microscope was then brought into the field.  With this complete, distractor  pins were placed in the vertebral bodies of C5 and C6. The distractor was placed, and the annulus at C5/6 was opened using a bovie.  Curettes and pituitary rongeurs used to remove the majority of disk, then the drill was used to remove the posterior osteophyte and begin the foraminotomies. The nerve hook was used to elevate the posterior longitudinal ligament, which was then removed with Kerrison rongeurs. Bilateral foraminotomies were performed. The microblunt nerve hook could be passed out the foramen bilaterally.   Meticulous hemostasis was obtained.  A biomechanical device (Globus Hedron 8 mm height x 14 mm width by 12 mm depth) was placed at C5/6. The device had been filled with allograft for aid in arthrodesis.  The caspar distractor was removed, and bone wax used for hemostasis. A separate, 12 mm Globus Xtend plate was chosen.  Two screws placed in each vertebral body, respectively making sure the screws were behind the locking mechanism.  Final AP and lateral radiographs were taken.   Please note that the plate is not inclusive to the biomechanical device.  The anchoring mechanism of the plate is completely separate from the biomechanical device.  With everything in good position, the wound was irrigated copiously and meticulous hemostasis obtained.  Wound was closed in 2 layers using interrupted inverted 3-0 Vicryl sutures.  The wound was dressed with dermabond, the head of bed at 30 degrees, taken to recovery room in stable condition.  No new postop neurological deficits were identified.  Sponge and pattie counts were correct at the end of the procedure.   I performed the entire procedure with the assistance of Geronimo Boot PA as an Pensions consultant. An assistant was required for this procedure due to the complexity.  The assistant provided assistance in tissue manipulation and suction, and was required for the successful  and safe performance of the procedure. I performed the critical portions of  the procedure.   Meade Maw MD

## 2022-08-21 NOTE — Anesthesia Preprocedure Evaluation (Signed)
Anesthesia Evaluation  Patient identified by MRN, date of birth, ID band Patient awake    Reviewed: Allergy & Precautions, NPO status , Patient's Chart, lab work & pertinent test results  History of Anesthesia Complications Negative for: history of anesthetic complications  Airway Mallampati: II  TM Distance: >3 FB Neck ROM: full    Dental  (+) Dental Advidsory Given, Teeth Intact   Pulmonary shortness of breath and with exertion, COPD,  oxygen dependent, Current SmokerPatient did not abstain from smoking.,    Pulmonary exam normal        Cardiovascular hypertension, negative cardio ROS Normal cardiovascular exam     Neuro/Psych PSYCHIATRIC DISORDERS negative neurological ROS     GI/Hepatic Neg liver ROS, GERD  ,  Endo/Other  negative endocrine ROS  Renal/GU      Musculoskeletal   Abdominal   Peds  Hematology negative hematology ROS (+)   Anesthesia Other Findings Past Medical History: 08/07/2019: Angioedema No date: Arthritis     Comment:  knees,shoulders, ankles, most joints No date: Cervical disc disorder     Comment:  difficuly moving neck left No date: COPD (chronic obstructive pulmonary disease) (HCC)     Comment:  chronic cough and wheezing No date: Dyspnea     Comment:  easily No date: GERD (gastroesophageal reflux disease) No date: Headache No date: Hemorrhoids No date: History of hiatal hernia No date: Hypertension     Comment:  controlled on meds 04/2019: Pneumonia No date: Prostate disorder No date: PTSD (post-traumatic stress disorder) No date: Schizophrenia ([place])     Comment:  Per patient's report.  Past Surgical History: 03/28/2020: CATARACT EXTRACTION W/PHACO; Left     Comment:  Procedure: CATARACT EXTRACTION PHACO AND INTRAOCULAR               LENS PLACEMENT ([place]) LEFT MALYUGIN  MILOOP,   5.29                00:41.0;  Surgeon: Birder Robson, MD;  Location:               [facility];  Service: Ophthalmology;                Laterality: Left; 12/14/2020: COLONOSCOPY WITH PROPOFOL; N/A     Comment:  Procedure: COLONOSCOPY WITH PROPOFOL;  Surgeon: Jonathon Bellows, MD;  Location: [facility] ENDOSCOPY;  Service:               Gastroenterology;  Laterality: N/A; 10/18/2021: ESOPHAGOGASTRODUODENOSCOPY (EGD) WITH PROPOFOL; N/A     Comment:  Procedure: ESOPHAGOGASTRODUODENOSCOPY (EGD) WITH               PROPOFOL;  Surgeon: Virgel Manifold, MD;  Location:               [facility] ENDOSCOPY;  Service: Endoscopy;  Laterality: N/A; No date: EYE SURGERY; Left     Comment:  injury from a nail No date: FOOT SURGERY; Left  BMI    Body Mass Index: 28.06 kg/m      Reproductive/Obstetrics negative OB ROS                             Anesthesia Physical Anesthesia Plan  ASA: 3  Anesthesia Plan: General ETT   Post-op Pain Management:    Induction: Intravenous  PONV Risk Score and Plan: 2 and Ondansetron, Dexamethasone, Propofol  infusion, TIVA and Midazolam  Airway Management Planned: Oral ETT  Additional Equipment:   Intra-op Plan:   Post-operative Plan: Extubation in OR  Informed Consent: I have reviewed the patients History and Physical, chart, labs and discussed the procedure including the risks, benefits and alternatives for the proposed anesthesia with the patient or authorized representative who has indicated his/her understanding and acceptance.     Dental Advisory Given  Plan Discussed with: Anesthesiologist, CRNA and Surgeon  Anesthesia Plan Comments: (Patient consented for risks of anesthesia including but not limited to:  - adverse reactions to medications - damage to eyes, teeth, lips or other oral mucosa - nerve damage due to positioning  - sore throat or hoarseness - Damage to heart, brain, nerves, lungs, other parts of body or loss of life  Patient voiced understanding.)        Anesthesia Quick  Evaluation

## 2022-08-21 NOTE — H&P (Signed)
Referring Physician:  No referring provider defined for this encounter.  Primary Physician:  [name], NP  History of Present Illness: 08/21/2022 [name] is here today with a chief complaint of neck and left arm pain.    07/18/2022 [name] is here today with a chief complaint of discomfort in his left arm and neck pain.  He feels that his balance has worsened.   02/26/2022 [name] is here today with a chief complaint of pain in his head, neck, and bilateral shoulder blades. He also complains of intermittent numbness and tingling in his hands, balance issues, dropping items, difficulty picking up items, and difficulty opening bottles.  Been having slightly worsening symptoms over the past year. He reports numbness and tingling. Movement makes his symptoms worse. Sleeping makes it better.  Conservative measures:  Physical therapy: has not participated  Multimodal medical therapy including regular antiinflammatories: tylenol, vicodin Injections: has not had any epidural steroid injections  Past Surgery: denies  Luke Briggs has symptoms of cervical myelopathy.  History of present illness from [name], [place] on 02/07/22: [name] is a 61 y.o with a history of reactive airway disease, HLD, GERD, urinary retention, hx of CVA who is here today for further evaluation of his cervical spine. He states he has had neck pain for many years that radiates into his bilateral shoulder blades and posterior aspect of his neck and head into the top of his scalp bilaterally. States this is worse with movement of his neck. He endorses numbness and tingling in his hands and all 5 digits that is constant in nature. He also endorses some intermittent episodes of imbalance as well as difficulty dropping things and changes in dexterity over the last year. He denies any radiating arm symptoms. In addition to these complaints, his primary complaint is  constant bilateral ringing in his ears that sounds like cicadas. He has seen Dr. Melrose Nakayama and states he has seen an ENT (however I do not see documentation of this). This seems to be unrelated to the severity of his neck pain. Of note, Mr. Hooton smokes a pack of cigarettes a day and drinks about 4-5 beers daily.  The symptoms are causing a significant impact on the patient's life.   Review of Systems:  A 10 point review of systems is negative, except for the pertinent positives and negatives detailed in the HPI.  Past Medical History: Past Medical History:  Diagnosis Date   Angioedema 08/07/2019   Arthritis    knees,shoulders, ankles, most joints   Cervical disc disorder    difficuly moving neck left   COPD (chronic obstructive pulmonary disease) (HCC)    chronic cough and wheezing   Dyspnea    easily   GERD (gastroesophageal reflux disease)    Headache    Hemorrhoids    History of hiatal hernia    Hypertension    controlled on meds   Pneumonia 04/2019   Prostate disorder    PTSD (post-traumatic stress disorder)    Schizophrenia ([place])    Per patient's report.    Past Surgical History: Past Surgical History:  Procedure Laterality Date   CATARACT EXTRACTION W/PHACO Left 03/28/2020   Procedure: CATARACT EXTRACTION PHACO AND INTRAOCULAR LENS PLACEMENT ([place]) LEFT MALYUGIN  MILOOP,   5.29  00:41.0;  Surgeon: Birder Robson, MD;  Location: [facility];  Service: Ophthalmology;  Laterality: Left;   COLONOSCOPY WITH PROPOFOL N/A 12/14/2020   Procedure: COLONOSCOPY WITH  PROPOFOL;  Surgeon: Jonathon Bellows, MD;  Location: [facility] ENDOSCOPY;  Service: Gastroenterology;  Laterality: N/A;   ESOPHAGOGASTRODUODENOSCOPY (EGD) WITH PROPOFOL N/A 10/18/2021   Procedure: ESOPHAGOGASTRODUODENOSCOPY (EGD) WITH PROPOFOL;  Surgeon: Virgel Manifold, MD;  Location: [facility] ENDOSCOPY;  Service: Endoscopy;  Laterality: N/A;   EYE SURGERY Left    injury from a nail   FOOT SURGERY Left      Allergies: Allergies as of 08/01/2022 - Review Complete 07/18/2022  Allergen Reaction Noted   Lisinopril Swelling 08/08/2019   Iodinated contrast media Hives 02/01/2022   Amlodipine  10/08/2019   Penicillins Rash 04/28/2019    Medications: Current Meds  Medication Sig   albuterol (VENTOLIN HFA) 108 (90 Base) MCG/ACT inhaler Inhale 2 puffs into the lungs every 6 (six) hours as needed for wheezing orshortness of breath.   atorvastatin (LIPITOR) 10 MG tablet Take 1 tablet (10 mg total) by mouth daily.   Budeson-Glycopyrrol-Formoterol (BREZTRI AEROSPHERE) 160-9-4.8 MCG/ACT AERO Inhale 2 puffs into the lungs in the morning and at bedtime.   Cholecalciferol (VITAMIN D3) 25 MCG (1000 UT) CAPS Take 2 capsules (2,000 Units total) by mouth daily.   finasteride (PROSCAR) 5 MG tablet Take 1 tablet (5 mg total) by mouth daily.   montelukast (SINGULAIR) 10 MG tablet Take 10 mg by mouth at bedtime.   olmesartan-hydrochlorothiazide (BENICAR HCT) 20-12.5 MG tablet Take 1 tablet by mouth daily.   omeprazole (PRILOSEC) 40 MG capsule Take 1 capsule (40 mg total) by mouth in the morning and at bedtime.   tamsulosin (FLOMAX) 0.4 MG CAPS capsule Take 1 capsule (0.4 mg total) by mouth daily.    Social History: Social History   Tobacco Use   Smoking status: Every Day    Packs/day: 2.50    Years: 43.00    Total pack years: 107.50    Types: Cigarettes   Smokeless tobacco: Never   Tobacco comments:    1.5PPD 08/02/2022  Vaping Use   Vaping Use: Never used  Substance Use Topics   Alcohol use: Yes    Alcohol/week: 8.0 standard drinks of alcohol    Types: 8 Cans of beer per week    Comment: beer   Drug use: Never    Family Medical History: Family History  Problem Relation Age of Onset   Heart disease Mother    Osteoporosis Mother    Heart disease Father    Emphysema Father    Heart disease Sister    Emphysema Maternal Grandmother    Heart disease Maternal Grandfather    Osteoporosis  Sister     Physical Examination: Vitals:   08/21/22 1131  BP: (!) 144/98  Pulse: 88  Resp: 18  Temp: 98.1 F (36.7 C)  SpO2: 96%   Heart sounds normal no MRG. Chest Clear to Auscultation Bilaterally.  General: Patient is well developed, well nourished, calm, collected, and in no apparent distress. Attention to examination is appropriate.  Neck:   Supple.  Full range of motion.  Respiratory: Patient is breathing without any difficulty.   NEUROLOGICAL:     Awake, alert, oriented to person, place, and time.  Speech is clear and fluent. Fund of knowledge is appropriate.   Cranial Nerves: Pupils equal round and reactive to light.  Facial tone is symmetric.  Facial sensation is symmetric. Shoulder shrug is symmetric. Tongue protrusion is midline.  There is no pronator drift.    Strength: Side Biceps Triceps Deltoid Interossei Grip Wrist Ext. Wrist Flex.  R '5 5 5 5 5 '$ 5  5  L '5 5 5 5 5 5 5   '$ Side Iliopsoas Quads Hamstring PF DF EHL  R '5 5 5 5 5 5  '$ L '5 5 5 5 5 5   '$ Reflexes are 2+ and symmetric at the biceps, triceps, brachioradialis, patella and achilles.   Hoffman's is absent.   Bilateral upper and lower extremity sensation is intact to light touch except L C6 which is diminished.    No evidence of dysmetria noted.  Gait is normal.     Medical Decision Making  Imaging: MRI C spine 01/04/22 IMPRESSION: 1. At C4-C5, there is a posterior disc osteophyte complex with bilateral uncovertebral ridging and facet arthropathy resulting in severe right and moderate left neural foraminal stenosis and mild spinal canal stenosis. 2. At C5-C6, there is grade 1 retrolisthesis, a prominent posterior disc osteophyte complex eccentric to the left with left worse than right uncovertebral ridging, and bilateral facet arthropathy resulting in severe left and moderate right neural foraminal stenosis and moderate spinal canal stenosis with mass effect on the cord but no cord signal  abnormality. 3. Severe left and moderate right neural foraminal stenosis at C6-C7.     Electronically Signed   By: Valetta Mole M.D.   On: 01/04/2022 11:48  I have personally reviewed the images and agree with the above interpretation.  Assessment and Plan: Mr. Moorer is a pleasant 61 y.o. male with stenosis and possible myelopathy.    We previously reviewed the risks for surgery.  I discussed the planned procedure at length with the patient, including the risks, benefits, alternatives, and indications. The risks discussed include but are not limited to bleeding, infection, need for reoperation, spinal fluid leak, stroke, vision loss, anesthetic complication, coma, paralysis, and even death. We also discussed the possibility of post-operative dysphagia, vocal cord paralysis, and the risk of adjacent segment disease in the future. I also described in detail that improvement was not guaranteed.  The patient expressed understanding of these risks, and asked that we proceed with surgery. I described the surgery in layman's terms, and gave ample opportunity for questions, which were answered to the best of my ability.  Thank you for involving me in the care of this patient.      Maryan Sivak K. Izora Ribas MD, [facility]

## 2022-08-22 ENCOUNTER — Encounter: Payer: Self-pay | Admitting: Neurosurgery

## 2022-08-22 ENCOUNTER — Ambulatory Visit: Payer: Self-pay | Admitting: Licensed Clinical Social Worker

## 2022-08-22 DIAGNOSIS — M4802 Spinal stenosis, cervical region: Secondary | ICD-10-CM | POA: Diagnosis not present

## 2022-08-22 MED ORDER — HYDRALAZINE HCL 20 MG/ML IJ SOLN
5.0000 mg | INTRAMUSCULAR | Status: DC | PRN
  Administered 2022-08-22: 5 mg via INTRAVENOUS
  Filled 2022-08-22: qty 1

## 2022-08-22 MED ORDER — ACETAMINOPHEN 325 MG PO TABS: 650.0000 mg | ORAL_TABLET | ORAL | Status: AC | PRN

## 2022-08-22 MED ORDER — CELECOXIB 200 MG PO CAPS: 200.0000 mg | ORAL_CAPSULE | Freq: Two times a day (BID) | ORAL | 0 refills | Status: DC

## 2022-08-22 MED ORDER — OXYCODONE HCL 5 MG PO TABS: 5.0000 mg | ORAL_TABLET | ORAL | 0 refills | Status: DC | PRN

## 2022-08-22 MED ORDER — METHOCARBAMOL 500 MG PO TABS: 500.0000 mg | ORAL_TABLET | Freq: Four times a day (QID) | ORAL | 0 refills | Status: DC | PRN

## 2022-08-22 NOTE — Discharge Summary (Signed)
Physician Discharge Summary  Patient ID: [name]. MRN: [id number] DOB/AGE: 01/11/1961 61 y.o.  Admit date: 08/21/2022 Discharge date: 08/22/2022  Admission Diagnoses:Principal Problem:   Cervical myelopathy [facility]) Active Problems:   Cervical spinal stenosis  Discharge Diagnoses:  Principal Problem:   Cervical myelopathy ([place]) Active Problems:   Cervical spinal stenosis   Discharged Condition: good  Hospital Course: Mr. Sedor was admitted for stenosis and myelopathy.  He is doing well from surgery on POD1 and was stable for discharge.  Consults: None  Significant Diagnostic Studies: radiology: X-Ray: C5-6 ACDF  Treatments: surgery: ACDF C5-6  Discharge Exam: Blood pressure (!) 183/86, pulse (!) 59, temperature 97.7 F (36.5 C), resp. rate 16, height '5\' 10"'$  (1.778 m), weight 88.7 kg, SpO2 100 %. General appearance: alert and cooperative CNI MAEW 5/5 Trachea midline, soft  Disposition: Discharge disposition: 01-Home or Self Care       Discharge Instructions     Discharge patient   Complete by: As directed    Discharge disposition: 01-Home or Self Care   Discharge patient date: 08/22/2022   Incentive spirometry RT   Complete by: As directed       Allergies as of 08/22/2022       Reactions   Lisinopril Swelling   Reaction: angioedema   Iodinated Contrast Media Hives   Amlodipine    Dizziness and edema with this   Penicillins Rash   Reaction: Feels like skin is on fire Did it involve swelling of the face/tongue/throat, SOB, or low BP? No Did it involve sudden or severe rash/hives, skin peeling, or any reaction on the inside of your mouth or nose? No Did you need to seek medical attention at a hospital or doctor's office? Yes When did it last happen? 30 years ago If all above answers are "NO", may proceed with cephalosporin use.        Medication List     TAKE these medications    acetaminophen 325 MG tablet Commonly known as:  TYLENOL Take 2 tablets (650 mg total) by mouth every 4 (four) hours as needed for mild pain ((score 1 to 3) or temp > 100.5).   albuterol 108 (90 Base) MCG/ACT inhaler Commonly known as: VENTOLIN HFA Inhale 2 puffs into the lungs every 6 (six) hours as needed for wheezing orshortness of breath.   atorvastatin 10 MG tablet Commonly known as: LIPITOR Take 1 tablet (10 mg total) by mouth daily.   Breztri Aerosphere 160-9-4.8 MCG/ACT Aero Generic drug: Budeson-Glycopyrrol-Formoterol Inhale 2 puffs into the lungs in the morning and at bedtime.   celecoxib 200 MG capsule Commonly known as: CELEBREX Take 1 capsule (200 mg total) by mouth every 12 (twelve) hours.   EPINEPHrine 0.3 mg/0.3 mL Soaj injection Commonly known as: EPI-PEN Inject 0.3 mLs (0.3 mg total) into the muscle as needed for anaphylaxis.   finasteride 5 MG tablet Commonly known as: PROSCAR Take 1 tablet (5 mg total) by mouth daily.   methocarbamol 500 MG tablet Commonly known as: ROBAXIN Take 1 tablet (500 mg total) by mouth every 6 (six) hours as needed for muscle spasms.   montelukast 10 MG tablet Commonly known as: SINGULAIR Take 10 mg by mouth at bedtime.   olmesartan-hydrochlorothiazide 20-12.5 MG tablet Commonly known as: BENICAR HCT Take 1 tablet by mouth daily.   omeprazole 40 MG capsule Commonly known as: PRILOSEC Take 1 capsule (40 mg total) by mouth in the morning and at bedtime.   oxyCODONE 5 MG immediate release  tablet Commonly known as: Oxy IR/ROXICODONE Take 1 tablet (5 mg total) by mouth every 4 (four) hours as needed for moderate pain ((score 4 to 6)).   tadalafil 5 MG tablet Commonly known as: CIALIS TAKE ONE TABLET BY MOUTH DAILY AS NEEDED FOR ERECTILE DYSFUNCTION   tamsulosin 0.4 MG Caps capsule Commonly known as: FLOMAX Take 1 capsule (0.4 mg total) by mouth daily.   Vitamin D3 25 MCG (1000 UT) Caps Take 2 capsules (2,000 Units total) by mouth daily.        Follow-up  Information     Geronimo Boot, PA-C Follow up on 09/05/2022.   Specialty: Neurosurgery Why: 0830 per schedule Contact information: [address] [place] [address] [phone]                 Signed: Meade Maw 08/22/2022, 8:32 AM

## 2022-08-22 NOTE — Progress Notes (Signed)
    Attending Progress Note  History: Luke Briggs. is here for cervical myelopathy.   POD1: He is doing well  Physical Exam: Vitals:   08/22/22 0508 08/22/22 0720  BP: (!) 170/104 (!) 183/86  Pulse: 67 (!) 59  Resp: 18 16  Temp: 97.7 F (36.5 C) 97.7 F (36.5 C)  SpO2: 100% 100%    AA Ox3 CNI  Strength:5/5 throughout BUE  Data:  No results for input(s): "NA", "K", "CL", "CO2", "BUN", "CREATININE", "LABGLOM", "GLUCOSE", "CALCIUM" in the last 168 hours. No results for input(s): "AST", "ALT", "ALKPHOS" in the last 168 hours.  Invalid input(s): "TBILI"   No results for input(s): "WBC", "HGB", "HCT", "PLT" in the last 168 hours. No results for input(s): "APTT", "INR" in the last 168 hours.       Other tests/results: n/a  Assessment/Plan:  Luke Briggs. Is doing well after surgical intervention  - mobilize - pain control - DVT prophylaxis   Meade Maw MD, [facility] Department of Neurosurgery

## 2022-08-22 NOTE — Patient Instructions (Signed)
Visit Information  Thank you for taking time to visit with me today. Please don't hesitate to contact me if I can be of assistance to you.   Following are the goals we discussed today:   Goals Addressed             This Visit's Progress    Care Coordination Activities- Patient Declined       Care Coordination Interventions: Assessed social determinant of health barriers  Active listening / Reflection utilized  Patient declined needing any services at this time.          Patient verbalizes understanding of instructions and care plan provided today and agrees to view in [place]. Active MyChart status and patient understanding of how to access instructions and care plan via MyChart confirmed with patient.     No further follow up required: .  Lenor Derrick, MSW  Social Worker [facility] Care [facility]  [phone]

## 2022-08-22 NOTE — Patient Outreach (Signed)
  Care Coordination   Initial Visit Note   08/22/2022 Name: Tahje Borawski. MRN: [id number] DOB: [birth year]  [name]. is a 61 y.o. year old male who sees [name], [name], NP for primary care. I spoke with  [name]. by phone today.  What matters to the patients health and wellness today?  Patient declined needing any services at this time.    Goals Addressed             This Visit's Progress    Care Coordination Activities- Patient Declined       Care Coordination Interventions: Assessed social determinant of health barriers  Active listening / Reflection utilized  Patient declined needing any services at this time.         SDOH assessments and interventions completed:  Yes     Care Coordination Interventions Activated:  Yes  Care Coordination Interventions:  Yes, provided   Follow up plan: No further intervention required.   Encounter Outcome:  Pt. Visit Completed

## 2022-08-22 NOTE — Plan of Care (Signed)

## 2022-08-22 NOTE — Progress Notes (Signed)
Spoke with the patient, his S/O is here with him and will provide transportation He stated that she will be helping him He stated that he does not need any DME and no HH

## 2022-08-22 NOTE — Anesthesia Postprocedure Evaluation (Signed)
Anesthesia Post Note  Patient: Luke Briggs.  Procedure(s) Performed: C5-6 ANTERIOR CERVICAL DISCECTOMY AND FUSION (GLOBUS HEDRON) (Spine Cervical)  Patient location during evaluation: PACU Anesthesia Type: General Level of consciousness: awake and alert Pain management: pain level controlled Vital Signs Assessment: post-procedure vital signs reviewed and stable Respiratory status: spontaneous breathing, nonlabored ventilation, respiratory function stable and patient connected to nasal cannula oxygen Cardiovascular status: blood pressure returned to baseline and stable Postop Assessment: no apparent nausea or vomiting Anesthetic complications: no   No notable events documented.   Last Vitals:  Vitals:   08/22/22 0508 08/22/22 0720  BP: (!) 170/104 (!) 183/86  Pulse: 67 (!) 59  Resp: 18 16  Temp: 36.5 C 36.5 C  SpO2: 100% 100%    Last Pain:  Vitals:   08/22/22 0559  TempSrc:   PainSc: [place]

## 2022-08-22 NOTE — Discharge Instructions (Signed)
Your surgeon has performed an operation on your cervical spine (neck) to relieve pressure on the spinal cord and/or nerves. This involved making an incision in the front of your neck and removing one or more of the discs that support your spine. Next, a small piece of bone, a titanium plate, and screws were used to fuse two or more of the vertebrae (bones) together.  The following are instructions to help in your recovery once you have been discharged from the hospital. Even if you feel well, it is important that you follow these activity guidelines. If you do not let your neck heal properly from the surgery, you can increase the chance of return of your symptoms and other complications.  * Do not take anti-inflammatory medications for 3 months after surgery (naproxen [Aleve], ibuprofen [Advil, Motrin], etc.). These medications can prevent your bones from healing properly.  Celebrex is OK to take.  Activity    No bending, lifting, or twisting ("BLT"). Avoid lifting objects heavier than 10 pounds (gallon milk jug).  Where possible, avoid household activities that involve lifting, bending, reaching, pushing, or pulling such as laundry, vacuuming, grocery shopping, and childcare. Try to arrange for help from friends and family for these activities while your back heals.  Increase physical activity slowly as tolerated.  Taking short walks is encouraged, but avoid strenuous exercise. Do not jog, run, bicycle, lift weights, or participate in any other exercises unless specifically allowed by your doctor.  Talk to your doctor before resuming sexual activity.  You should not drive until cleared by your doctor.  Until released by your doctor, you should not return to work or school.  You should rest at home and let your body heal.   You may shower three days after your surgery.  After showering, lightly dab your incision dry. Do not take a tub bath or go swimming until approved by your doctor at your  follow-up appointment.  If your doctor ordered a cervical collar (neck brace) for you, you should wear it whenever you are out of bed. You may remove it when lying down or sleeping, but you should wear it at all other times. Not all neck surgeries require a cervical collar.  If you smoke, we strongly recommend that you quit.  Smoking has been proven to interfere with normal bone healing and will dramatically reduce the success rate of your surgery. Please contact [facility] (800-QUIT-NOW) and use the resources at www.[facility].com for assistance in stopping smoking.  Surgical Incision   If you have a dressing on your incision, you may remove it two days after your surgery. Keep your incision area clean and dry.  If you have staples or stitches on your incision, you should have a follow up scheduled for removal. If you do not have staples or stitches, you will have steri-strips (small pieces of surgical tape) or Dermabond glue. The steri-strips/glue should begin to peel away within about a week (it is fine if the steri-strips fall off before then). If the strips are still in place one week after your surgery, you may gently remove them.  Diet           You may return to your usual diet. However, you may experience discomfort when swallowing in the first month after your surgery. This is normal. You may find that softer foods are more comfortable for you to swallow. Be sure to stay hydrated.  When to Contact us  You may experience pain in your neck and/or  pain between your shoulder blades. This is normal and should improve in the next few weeks with the help of pain medication, muscle relaxers, and rest. Some patients report that a warm compress on the back of the neck or between the shoulder blades helps.  However, should you experience any of the following, contact us immediately: New numbness or weakness Pain that is progressively getting worse, and is not relieved by your pain medication,  muscle relaxers, rest, and warm compresses Bleeding, redness, swelling, pain, or drainage from surgical incision Chills or flu-like symptoms Fever greater than 101.0 F (38.3 C) Inability to eat, drink fluids, or take medications Problems with bowel or bladder functions Difficulty breathing or shortness of breath Warmth, tenderness, or swelling in your calf Contact Information During office hours (Monday-Friday 9 am to 5 pm), please call your physician at [phone] and ask for Berdine Addison After hours and weekends, please call [phone] and speak with the neurosurgeon on call For a life-threatening emergency, call 911

## 2022-08-22 NOTE — Evaluation (Signed)
Occupational Therapy Evaluation Patient Details Name: Luke Briggs. MRN: [id number] DOB: 1961-11-14 Today's Date: 08/22/2022   History of Present Illness [name] is here today with a chief complaint of neck and left arm pain. s/p C5-6 ANTERIOR CERVICAL DISCECTOMY AND FUSION (GLOBUS HEDRON) (Spine Cervical)   Clinical Impression   Patient presenting with decreased independence in self-care, functional mobility, and safety. Patient standing at bedside, upon arrival  and agreeable to OT services. Patient reports he lives with his spouse in a mobile home, 3 stairs at entrance, no AD in the home. Patient reports he is independent at baseline. Patient currently functioning independently for functional mobility. Patient was able to ambulate ~100 ft independently around nursing station. Patient was also able to ambulate up and down 7 stairs using railings on 1 side independently; no LOB observed. Patient with c/o dizziness, stating "it is from the pain medication." Upon arrival patient was already dressed stating "it went well." Patient was educated on figure 4 dressing technique to assist with LB dressing and educated on neck precautions. Patient was able to demonstrate and verbalize understanding. No OT follow up recommended at this time. OT to complete order.      Recommendations for follow up therapy are one component of a multi-disciplinary discharge planning process, led by the attending physician.  Recommendations may be updated based on patient status, additional functional criteria and insurance authorization.   Follow Up Recommendations  No OT follow up    Assistance Recommended at Discharge Set up Supervision/Assistance  Patient can return home with the following A lot of help with bathing/dressing/bathroom;Assist for transportation    Functional Status Assessment  Patient has had a recent decline in their functional status and demonstrates the ability to make significant  improvements in function in a reasonable and predictable amount of time.  Equipment Recommendations  None recommended by OT    Recommendations for Other Services       Precautions / Restrictions Precautions Precautions: Fall Restrictions Weight Bearing Restrictions: No      Mobility Bed Mobility Overal bed mobility: Independent             General bed mobility comments: Patient standing at bedside upon arrival.    Transfers Overall transfer level: Independent Equipment used: None                      Balance Overall balance assessment: Independent                                         ADL either performed or assessed with clinical judgement   ADL Overall ADL's : Independent                                       General ADL Comments: Patient dressed prior to arrival. Reports it wents well. Educated provied for cervical precautions for safety.      Pertinent Vitals/Pain Pain Assessment Pain Assessment: Faces Faces Pain Scale: Hurts a little bit Pain Location: neck Pain Descriptors / Indicators: Discomfort, Sore Pain Intervention(s): Monitored during session, Premedicated before session, Limited activity within patient's tolerance     Hand Dominance Right   Extremity/Trunk Assessment Upper Extremity Assessment Upper Extremity Assessment: Overall WFL for tasks assessed   Lower Extremity Assessment Lower Extremity Assessment:  Overall WFL for tasks assessed       Communication Communication Communication: No difficulties   Cognition Arousal/Alertness: Awake/alert Behavior During Therapy: WFL for tasks assessed/performed Overall Cognitive Status: Within Functional Limits for tasks assessed                                                  Home Living Family/patient expects to be discharged to:: Private residence Living Arrangements: Spouse/significant other Available Help at Discharge:  Family;Available PRN/intermittently Type of Home: Mobile home Home Access: Stairs to enter Entrance Stairs-Number of Steps: 3 Entrance Stairs-Rails: Right;Left Home Layout: One level     Bathroom Shower/Tub: Teacher, early years/pre: Standard                Prior Functioning/Environment Prior Level of Function : Independent/Modified Independent                                OT Goals(Current goals can be found in the care plan section) Acute Rehab OT Goals Patient Stated Goal: to return home. OT Goal Formulation: With patient Time For Goal Achievement: 08/22/22 Potential to Achieve Goals: Good   AM-PAC OT "6 Clicks" Daily Activity     Outcome Measure Help from another person eating meals?: None Help from another person taking care of personal grooming?: None Help from another person toileting, which includes using toliet, bedpan, or urinal?: None Help from another person bathing (including washing, rinsing, drying)?: None Help from another person to put on and taking off regular upper body clothing?: A Little Help from another person to put on and taking off regular lower body clothing?: A Little 6 Click Score: 22   End of Session Nurse Communication: Mobility status  Activity Tolerance: Patient tolerated treatment well Patient left: with call bell/phone within reach                   Time: 0900-0908 OT Time Calculation (min): 8 min Charges:  OT General Charges $OT Visit: 1 Visit OT Evaluation $OT Eval Low Complexity: 1 Low    Roney Youtz, OTS 08/22/2022, 9:21 AM

## 2022-08-23 ENCOUNTER — Emergency Department: Payer: Medicaid Other

## 2022-08-23 ENCOUNTER — Encounter: Payer: Self-pay | Admitting: *Deleted

## 2022-08-23 ENCOUNTER — Emergency Department
Admission: EM | Admit: 2022-08-23 | Discharge: 2022-08-24 | Disposition: A | Discharge status: Home / Self Care | Payer: Medicaid Other | Attending: Emergency Medicine | Admitting: Emergency Medicine

## 2022-08-23 ENCOUNTER — Other Ambulatory Visit: Payer: Self-pay

## 2022-08-23 DIAGNOSIS — G8918 Other acute postprocedural pain: Secondary | ICD-10-CM | POA: Diagnosis not present

## 2022-08-23 DIAGNOSIS — R079 Chest pain, unspecified: Secondary | ICD-10-CM | POA: Insufficient documentation

## 2022-08-23 DIAGNOSIS — R42 Dizziness and giddiness: Secondary | ICD-10-CM | POA: Diagnosis not present

## 2022-08-23 DIAGNOSIS — Z7951 Long term (current) use of inhaled steroids: Secondary | ICD-10-CM | POA: Diagnosis not present

## 2022-08-23 DIAGNOSIS — M96840 Postprocedural hematoma of a musculoskeletal structure following a musculoskeletal system procedure: Secondary | ICD-10-CM

## 2022-08-23 DIAGNOSIS — I10 Essential (primary) hypertension: Secondary | ICD-10-CM | POA: Diagnosis not present

## 2022-08-23 DIAGNOSIS — M96830 Postprocedural hemorrhage and hematoma of a musculoskeletal structure following a musculoskeletal system procedure: Secondary | ICD-10-CM | POA: Insufficient documentation

## 2022-08-23 DIAGNOSIS — J449 Chronic obstructive pulmonary disease, unspecified: Secondary | ICD-10-CM | POA: Diagnosis not present

## 2022-08-23 DIAGNOSIS — R1310 Dysphagia, unspecified: Secondary | ICD-10-CM | POA: Insufficient documentation

## 2022-08-23 DIAGNOSIS — T8189XA Other complications of procedures, not elsewhere classified, initial encounter: Secondary | ICD-10-CM | POA: Diagnosis not present

## 2022-08-23 DIAGNOSIS — Z79899 Other long term (current) drug therapy: Secondary | ICD-10-CM | POA: Insufficient documentation

## 2022-08-23 LAB — BASIC METABOLIC PANEL
Anion gap: 10 (ref 5–15)
BUN: 10 mg/dL (ref 6–20)
CO2: 28 mmol/L (ref 22–32)
Calcium: 9 mg/dL (ref 8.9–10.3)
Chloride: 97 mmol/L — ABNORMAL LOW (ref 98–111)
Creatinine, Ser: 0.74 mg/dL (ref 0.61–1.24)
GFR, Estimated: >60 mL/min (ref >60)
Glucose, Bld: 99 mg/dL (ref 70–99)
Potassium: 3.4 mmol/L — ABNORMAL LOW (ref 3.5–5.1)
Sodium: 135 mmol/L (ref 135–145)

## 2022-08-23 LAB — CBC
HCT: 41.6 % (ref 39.0–52.0)
Hemoglobin: 14.4 g/dL (ref 13.0–17.0)
MCH: 31 pg (ref 26.0–34.0)
MCHC: 34.6 g/dL (ref 30.0–36.0)
MCV: 89.7 fL (ref 80.0–100.0)
Platelets: 319 10*3/uL (ref 150–400)
RBC: 4.64 MIL/uL (ref 4.22–5.81)
RDW: 12.1 % (ref 11.5–15.5)
WBC: 14.7 10*3/uL — ABNORMAL HIGH (ref 4.0–10.5)
nRBC: 0 % (ref 0.0–0.2)

## 2022-08-23 LAB — TROPONIN I (HIGH SENSITIVITY): Troponin I (High Sensitivity): 5 ng/L (ref <18)

## 2022-08-23 MED ORDER — DEXAMETHASONE SODIUM PHOSPHATE 10 MG/ML IJ SOLN
10.0000 mg | Freq: Once | INTRAMUSCULAR | Status: AC
  Administered 2022-08-23: 10 mg via INTRAVENOUS
  Filled 2022-08-23: qty 1

## 2022-08-23 MED ORDER — HYDROMORPHONE HCL 1 MG/ML IJ SOLN
0.5000 mg | Freq: Once | INTRAMUSCULAR | Status: AC
  Administered 2022-08-23: 0.5 mg via INTRAVENOUS
  Filled 2022-08-23: qty 0.5

## 2022-08-23 MED ORDER — ONDANSETRON HCL 4 MG/2ML IJ SOLN
4.0000 mg | Freq: Once | INTRAMUSCULAR | Status: AC
  Administered 2022-08-23: 4 mg via INTRAVENOUS
  Filled 2022-08-23: qty 2

## 2022-08-23 MED ORDER — SODIUM CHLORIDE 0.9 % IV BOLUS
500.0000 mL | Freq: Once | INTRAVENOUS | Status: AC
  Administered 2022-08-23: 500 mL via INTRAVENOUS

## 2022-08-23 NOTE — ED Notes (Signed)
Pt brought to ED rm 10, this RN now assuming care.

## 2022-08-23 NOTE — ED Triage Notes (Signed)
Pt reports he had a C5-6 anterior cervical discectomy and fusion on 10/4. He presents with increased bleeding to incision on his anterior neck that worsened about 2 hours ago associated with trouble swallowing, chest pain, dizziness, and headache.

## 2022-08-23 NOTE — ED Provider Notes (Incomplete)
[facility] Provider Note    Event Date/Time   First MD Initiated Contact with Patient 08/23/22 2340     (approximate)   History   Post-op Problem   HPI  Luke Briggs. is a 61 y.o. male who presents to the ED from home with postoperative pain and bleeding.  Patient had C5-6 anterior cervical discectomy and fusion on 08/21/2022 by Dr. Cari Caraway.  States he was at rest when he began to bleed through his bandage about 2 hours ago.  Endorses difficulty swallowing, chest pain and dizziness.  Denies fever/chills, cough, abdominal pain, nausea/vomiting.     Past Medical History   Past Medical History:  Diagnosis Date  . Angioedema 08/07/2019  . Arthritis    knees,shoulders, ankles, most joints  . Cervical disc disorder    difficuly moving neck left  . COPD (chronic obstructive pulmonary disease) (HCC)    chronic cough and wheezing  . Dyspnea    easily  . GERD (gastroesophageal reflux disease)   . Headache   . Hemorrhoids   . History of hiatal hernia   . Hypertension    controlled on meds  . Pneumonia 04/2019  . Prostate disorder   . PTSD (post-traumatic stress disorder)   . Schizophrenia ([place])    Per patient's report.     Active Problem List   Patient Active Problem List   Diagnosis Date Noted  . Cervical spinal stenosis 08/21/2022  . Cervical myelopathy ([place]) 05/28/2022  . Cervical stenosis of spinal canal 12/18/2021  . Gastric erythema   . Elevated serum GGT level 07/26/2021  . Fatigue 06/05/2021  . Mixed hyperlipidemia 03/27/2021  . History of lacunar cerebrovascular accident 02/13/2021  . Tinnitus of both ears 02/13/2021  . Migraines 02/13/2021  . History of opioid abuse ([place]) 01/10/2021  . Carotid stenosis 12/31/2020  . Chronic vertigo 08/28/2020  . Erectile dysfunction 03/22/2020  . Aortic atherosclerosis ([place]) 09/29/2019  . Bilateral primary osteoarthritis of knee 07/01/2019  . Paraseptal emphysema ([place]) 06/17/2019  .  Controlled substance agreement signed 05/05/2019  . Chronic pain syndrome 04/28/2019  . BPH (benign prostatic hyperplasia) 04/28/2019  . Eczema 04/28/2019  . Essential hypertension 04/28/2019  . GERD (gastroesophageal reflux disease) 04/28/2019  . Schizophrenia ([place]) 04/28/2019  . Chronic post-traumatic stress disorder (PTSD) 04/28/2019  . Nicotine dependence, cigarettes, w unsp disorders 04/28/2019  . Alcohol dependence ([place]) 04/28/2019     Past Surgical History   Past Surgical History:  Procedure Laterality Date  . ANTERIOR CERVICAL DECOMP/DISCECTOMY FUSION N/A 08/21/2022   Procedure: C5-6 ANTERIOR CERVICAL DISCECTOMY AND FUSION (GLOBUS HEDRON);  Surgeon: Meade Maw, MD;  Location: [facility] ORS;  Service: Neurosurgery;  Laterality: N/A;  . CATARACT EXTRACTION W/PHACO Left 03/28/2020   Procedure: CATARACT EXTRACTION PHACO AND INTRAOCULAR LENS PLACEMENT ([place]) LEFT MALYUGIN  MILOOP,   5.29  00:41.0;  Surgeon: Birder Robson, MD;  Location: [facility];  Service: Ophthalmology;  Laterality: Left;  . COLONOSCOPY WITH PROPOFOL N/A 12/14/2020   Procedure: COLONOSCOPY WITH PROPOFOL;  Surgeon: Jonathon Bellows, MD;  Location: [facility] ENDOSCOPY;  Service: Gastroenterology;  Laterality: N/A;  . ESOPHAGOGASTRODUODENOSCOPY (EGD) WITH PROPOFOL N/A 10/18/2021   Procedure: ESOPHAGOGASTRODUODENOSCOPY (EGD) WITH PROPOFOL;  Surgeon: Virgel Manifold, MD;  Location: [facility] ENDOSCOPY;  Service: Endoscopy;  Laterality: N/A;  . EYE SURGERY Left    injury from a nail  . FOOT SURGERY Left      Home Medications   Prior to Admission medications   Medication Sig Start Date  End Date Taking? Authorizing Provider  acetaminophen (TYLENOL) 325 MG tablet Take 2 tablets (650 mg total) by mouth every 4 (four) hours as needed for mild pain ((score 1 to 3) or temp > 100.5). 08/22/22   Meade Maw, MD  albuterol (VENTOLIN HFA) 108 (90 Base) MCG/ACT inhaler Inhale 2 puffs into the lungs every 6 (six) hours  as needed for wheezing orshortness of breath. 03/27/21   [name], [name], NP  atorvastatin (LIPITOR) 10 MG tablet Take 1 tablet (10 mg total) by mouth daily. 01/14/22   [name], [name], NP  Budeson-Glycopyrrol-Formoterol (BREZTRI AEROSPHERE) 160-9-4.8 MCG/ACT AERO Inhale 2 puffs into the lungs in the morning and at bedtime. 05/28/22   [name], [name], NP  celecoxib (CELEBREX) 200 MG capsule Take 1 capsule (200 mg total) by mouth every 12 (twelve) hours. 08/22/22   Meade Maw, MD  Cholecalciferol (VITAMIN D3) 25 MCG (1000 UT) CAPS Take 2 capsules (2,000 Units total) by mouth daily. 01/15/22   [name], [name], NP  EPINEPHrine 0.3 mg/0.3 mL IJ SOAJ injection Inject 0.3 mLs (0.3 mg total) into the muscle as needed for anaphylaxis. 08/09/19   Gouru, Illene Silver, MD  finasteride (PROSCAR) 5 MG tablet Take 1 tablet (5 mg total) by mouth daily. 01/14/22   [name], [name], NP  methocarbamol (ROBAXIN) 500 MG tablet Take 1 tablet (500 mg total) by mouth every 6 (six) hours as needed for muscle spasms. 08/22/22   Meade Maw, MD  montelukast (SINGULAIR) 10 MG tablet Take 10 mg by mouth at bedtime.    [provider]  olmesartan-hydrochlorothiazide (BENICAR HCT) 20-12.5 MG tablet Take 1 tablet by mouth daily. 05/18/21   [provider]  omeprazole (PRILOSEC) 40 MG capsule Take 1 capsule (40 mg total) by mouth in the morning and at bedtime. 01/14/22   [name], [name], NP  oxyCODONE (OXY IR/ROXICODONE) 5 MG immediate release tablet Take 1 tablet (5 mg total) by mouth every 4 (four) hours as needed for moderate pain ((score 4 to 6)). 08/22/22   Meade Maw, MD  tadalafil (CIALIS) 5 MG tablet TAKE ONE TABLET BY MOUTH DAILY AS NEEDED FOR ERECTILE DYSFUNCTION 04/26/22   Stoioff, Ronda Fairly, MD  tamsulosin (FLOMAX) 0.4 MG CAPS capsule Take 1 capsule (0.4 mg total) by mouth daily. 06/28/21   [name], [name], NP  carvedilol (COREG) 3.125 MG tablet Take 1 tablet (3.125 mg total) by mouth 2 (two)  times daily. 07/03/20 08/10/20  Kate Sable, MD     Allergies  Lisinopril, Iodinated contrast media, Amlodipine, and Penicillins   Family History   Family History  Problem Relation Age of Onset  . Heart disease Mother   . Osteoporosis Mother   . Heart disease Father   . Emphysema Father   . Heart disease Sister   . Emphysema Maternal Grandmother   . Heart disease Maternal Grandfather   . Osteoporosis Sister      Physical Exam  Triage Vital Signs: ED Triage Vitals [08/23/22 2248]  Enc Vitals Group     BP 137/88     Pulse Rate 98     Resp 18     Temp 98 F (36.7 C)     Temp Source Oral     SpO2 96 %     Weight 195 lb (88.5 kg)     Height '5\' 10"'$  (1.778 m)     Head Circumference      Peak Flow      Pain Score 10  Pain Loc      Pain Edu?      Excl. in [place]?     Updated Vital Signs: BP 137/88 (BP Location: Left Arm)   Pulse 98   Temp 98 F (36.7 C) (Oral)   Resp 18   Ht '5\' 10"'$  (1.778 m)   Wt 88.5 kg   SpO2 96%   BMI 27.98 kg/m    General: Awake, mild distress.  CV:  RRR.  Good peripheral perfusion.  Resp:  Normal effort.  CTA B. Abd:  Nontender.  No distention.  Other:  Posterior oropharynx clear.  No tongue or lip angioedema.  Postop site with moderate swelling, some serosanguineous discharge expressed from lateral aspect.   ED Results / Procedures / Treatments  Labs (all labs ordered are listed, but only abnormal results are displayed) Labs Reviewed  BASIC METABOLIC PANEL - Abnormal; Notable for the following components:      Result Value   Potassium 3.4 (*)    Chloride 97 (*)    All other components within normal limits  CBC - Abnormal; Notable for the following components:   WBC 14.7 (*)    All other components within normal limits  TROPONIN I (HIGH SENSITIVITY)     EKG  ED ECG REPORT I, Braylynn Lewing J, the attending physician, personally viewed and interpreted this ECG.   Date: 08/23/2022  EKG Time: 2250  Rate: 99  Rhythm:  normal sinus rhythm  Axis: Normal  Intervals:none  ST&T Change: Nonspecific    RADIOLOGY I have independently visualized and interpreted patient's chest xray as well as noted the radiology interpretation:  Chest xray: No acute cardiopulmonary process  Official radiology report(s): DG Chest Portable 1 View  Result Date: 08/23/2022 CLINICAL DATA:  Chest pain, recent ACDF EXAM: PORTABLE CHEST 1 VIEW COMPARISON:  CT chest dated 10/23/2021 FINDINGS: Lungs are clear.  No pleural effusion or pneumothorax. The heart is normal in size. IMPRESSION: No evidence of acute cardiopulmonary disease. Electronically Signed   By: Julian Hy M.D.   On: 08/23/2022 23:20     PROCEDURES:  Critical Care performed: {CriticalCareYesNo:19197::"Yes, see critical care procedure note(s)","No"}  Procedures   MEDICATIONS ORDERED IN ED: Medications  HYDROmorphone (DILAUDID) injection 0.5 mg (has no administration in time range)  ondansetron (ZOFRAN) injection 4 mg (has no administration in time range)  dexamethasone (DECADRON) injection 10 mg (has no administration in time range)  sodium chloride 0.9 % bolus 500 mL (500 mLs Intravenous New Bag/Given 08/23/22 2354)     IMPRESSION / MDM / ASSESSMENT AND PLAN / ED COURSE  I reviewed the triage vital signs and the nursing notes.                              Differential diagnosis includes, but is not limited to, ***  Patient's presentation is most consistent with {EM COPA:27473}  {If the patient is on the monitor, remove the brackets and asterisks on the sentence below and remember to document it as a Procedure as well. Otherwise delete the sentence below:1} {**The patient is on the cardiac monitor to evaluate for evidence of arrhythmia and/or significant heart rate changes.**}  {Remember to include, when applicable, any/all of the following data: independent review of imaging independent review of labs (comment specifically on pertinent positives  and negatives) review of specific prior hospitalizations, PCP/specialist notes, etc. discuss meds given and prescribed document any discussion with consultants (including hospitalists) any clinical decision tools  you used and why (PECARN, NEXUS, etc.) did you consider admitting the patient? document social determinants of health affecting patient's care (homelessness, inability to follow up in a timely fashion, etc) document any pre-existing conditions increasing risk on current visit (e.g. diabetes and HTN increasing danger of high-risk chest pain/ACS) describes what meds you gave (especially parenteral) and why any other interventions?:1}  Clinical Course as of 08/23/22 2356  Fri Aug 23, 2022  2351 Spoke with Dr. Cari Caraway from neurosurgery who does not recommend imaging.  Recommends '10mg'$  IV Decadron and will evaluate patient in the ED. [JS]    Clinical Course User Index [JS] Paulette Blanch, MD     FINAL CLINICAL IMPRESSION(S) / ED DIAGNOSES   Final diagnoses:  Postoperative hematoma of musculoskeletal structure following musculoskeletal procedure     Rx / DC Orders   ED Discharge Orders     None        Note:  This document was prepared using Dragon voice recognition software and may include unintentional dictation errors.

## 2022-08-23 NOTE — ED Provider Notes (Signed)
[facility] Provider Note    Event Date/Time   First MD Initiated Contact with Patient 08/23/22 2340     (approximate)   History   Post-op Problem   HPI  Luke Briggs. is a 61 y.o. male who presents to the ED from home with postoperative pain and bleeding.  Patient had C5-6 anterior cervical discectomy and fusion on 08/21/2022 by Dr. Cari Caraway.  States he was at rest when he began to bleed through his bandage about 2 hours ago.  Endorses difficulty swallowing, chest pain and dizziness.  Denies fever/chills, cough, abdominal pain, nausea/vomiting.     Past Medical History   Past Medical History:  Diagnosis Date   Angioedema 08/07/2019   Arthritis    knees,shoulders, ankles, most joints   Cervical disc disorder    difficuly moving neck left   COPD (chronic obstructive pulmonary disease) (HCC)    chronic cough and wheezing   Dyspnea    easily   GERD (gastroesophageal reflux disease)    Headache    Hemorrhoids    History of hiatal hernia    Hypertension    controlled on meds   Pneumonia 04/2019   Prostate disorder    PTSD (post-traumatic stress disorder)    Schizophrenia ([place])    Per patient's report.     Active Problem List   Patient Active Problem List   Diagnosis Date Noted   Draining postoperative wound    Cervical spinal stenosis 08/21/2022   Cervical myelopathy ([place]) 05/28/2022   Cervical stenosis of spinal canal 12/18/2021   Gastric erythema    Elevated serum GGT level 07/26/2021   Fatigue 06/05/2021   Mixed hyperlipidemia 03/27/2021   History of lacunar cerebrovascular accident 02/13/2021   Tinnitus of both ears 02/13/2021   Migraines 02/13/2021   History of opioid abuse ([place]) 01/10/2021   Carotid stenosis 12/31/2020   Chronic vertigo 08/28/2020   Erectile dysfunction 03/22/2020   Aortic atherosclerosis ([place]) 09/29/2019   Bilateral primary osteoarthritis of knee 07/01/2019   Paraseptal emphysema ([place]) 06/17/2019    Controlled substance agreement signed 05/05/2019   Chronic pain syndrome 04/28/2019   BPH (benign prostatic hyperplasia) 04/28/2019   Eczema 04/28/2019   Essential hypertension 04/28/2019   GERD (gastroesophageal reflux disease) 04/28/2019   Schizophrenia ([place]) 04/28/2019   Chronic post-traumatic stress disorder (PTSD) 04/28/2019   Nicotine dependence, cigarettes, w unsp disorders 04/28/2019   Alcohol dependence ([place]) 04/28/2019     Past Surgical History   Past Surgical History:  Procedure Laterality Date   ANTERIOR CERVICAL DECOMP/DISCECTOMY FUSION N/A 08/21/2022   Procedure: C5-6 ANTERIOR CERVICAL DISCECTOMY AND FUSION (GLOBUS HEDRON);  Surgeon: Meade Maw, MD;  Location: [facility] ORS;  Service: Neurosurgery;  Laterality: N/A;   CATARACT EXTRACTION W/PHACO Left 03/28/2020   Procedure: CATARACT EXTRACTION PHACO AND INTRAOCULAR LENS PLACEMENT ([place]) LEFT MALYUGIN  MILOOP,   5.29  00:41.0;  Surgeon: Birder Robson, MD;  Location: [place];  Service: Ophthalmology;  Laterality: Left;   COLONOSCOPY WITH PROPOFOL N/A 12/14/2020   Procedure: COLONOSCOPY WITH PROPOFOL;  Surgeon: Jonathon Bellows, MD;  Location: [facility] ENDOSCOPY;  Service: Gastroenterology;  Laterality: N/A;   ESOPHAGOGASTRODUODENOSCOPY (EGD) WITH PROPOFOL N/A 10/18/2021   Procedure: ESOPHAGOGASTRODUODENOSCOPY (EGD) WITH PROPOFOL;  Surgeon: Virgel Manifold, MD;  Location: [facility] ENDOSCOPY;  Service: Endoscopy;  Laterality: N/A;   EYE SURGERY Left    injury from a nail   FOOT SURGERY Left      Home Medications   Prior to Admission medications  Medication Sig Start Date End Date Taking? Authorizing Provider  atorvastatin (LIPITOR) 10 MG tablet Take 1 tablet (10 mg total) by mouth daily. 01/14/22  Yes [name], [name] T, NP  Budeson-Glycopyrrol-Formoterol (BREZTRI AEROSPHERE) 160-9-4.8 MCG/ACT AERO Inhale 2 puffs into the lungs in the morning and at bedtime. 05/28/22  Yes [name], [name] T, NP  celecoxib  (CELEBREX) 200 MG capsule Take 1 capsule (200 mg total) by mouth every 12 (twelve) hours. 08/22/22  Yes Meade Maw, MD  Cholecalciferol (VITAMIN D3) 25 MCG (1000 UT) CAPS Take 2 capsules (2,000 Units total) by mouth daily. 01/15/22  Yes [name], [name] T, NP  finasteride (PROSCAR) 5 MG tablet Take 1 tablet (5 mg total) by mouth daily. 01/14/22  Yes [name], [name] T, NP  methocarbamol (ROBAXIN) 500 MG tablet Take 1 tablet (500 mg total) by mouth every 6 (six) hours as needed for muscle spasms. 08/22/22  Yes Meade Maw, MD  methylPREDNISolone (MEDROL DOSEPAK) 4 MG TBPK tablet Take as directed 08/24/22  Yes Paulette Blanch, MD  montelukast (SINGULAIR) 10 MG tablet Take 10 mg by mouth at bedtime.   Yes [provider]  olmesartan-hydrochlorothiazide (BENICAR HCT) 20-12.5 MG tablet Take 1 tablet by mouth daily. 05/18/21  Yes [provider]  omeprazole (PRILOSEC) 40 MG capsule Take 1 capsule (40 mg total) by mouth in the morning and at bedtime. 01/14/22  Yes [name], [name] T, NP  oxyCODONE (OXY IR/ROXICODONE) 5 MG immediate release tablet Take 1 tablet (5 mg total) by mouth every 4 (four) hours as needed for moderate pain ((score 4 to 6)). 08/22/22  Yes Meade Maw, MD  tamsulosin (FLOMAX) 0.4 MG CAPS capsule Take 1 capsule (0.4 mg total) by mouth daily. 06/28/21  Yes [name], [name] T, NP  acetaminophen (TYLENOL) 325 MG tablet Take 2 tablets (650 mg total) by mouth every 4 (four) hours as needed for mild pain ((score 1 to 3) or temp > 100.5). 08/22/22   Meade Maw, MD  albuterol (VENTOLIN HFA) 108 (90 Base) MCG/ACT inhaler Inhale 2 puffs into the lungs every 6 (six) hours as needed for wheezing orshortness of breath. 03/27/21   [name], [name], NP  EPINEPHrine 0.3 mg/0.3 mL IJ SOAJ injection Inject 0.3 mLs (0.3 mg total) into the muscle as needed for anaphylaxis. 08/09/19   Gouru, Illene Silver, MD  tadalafil (CIALIS) 5 MG tablet TAKE ONE TABLET BY MOUTH DAILY AS NEEDED FOR  ERECTILE DYSFUNCTION 04/26/22   Stoioff, Ronda Fairly, MD  carvedilol (COREG) 3.125 MG tablet Take 1 tablet (3.125 mg total) by mouth 2 (two) times daily. 07/03/20 08/10/20  Kate Sable, MD     Allergies  Lisinopril, Iodinated contrast media, Amlodipine, and Penicillins   Family History   Family History  Problem Relation Age of Onset   Heart disease Mother    Osteoporosis Mother    Heart disease Father    Emphysema Father    Heart disease Sister    Emphysema Maternal Grandmother    Heart disease Maternal Grandfather    Osteoporosis Sister      Physical Exam  Triage Vital Signs: ED Triage Vitals [08/23/22 2248]  Enc Vitals Group     BP 137/88     Pulse Rate 98     Resp 18     Temp 98 F (36.7 C)     Temp Source Oral     SpO2 96 %     Weight 195 lb (88.5 kg)     Height '5\' 10"'$  (1.778 m)  Head Circumference      Peak Flow      Pain Score 10     Pain Loc      Pain Edu?      Excl. in [place]?     Updated Vital Signs: BP 126/79   Pulse 66   Temp 98 F (36.7 C) (Oral)   Resp 18   Ht '5\' 10"'$  (1.778 m)   Wt 88.5 kg   SpO2 94%   BMI 27.98 kg/m    General: Awake, mild distress.  CV:  RRR.  Good peripheral perfusion.  Resp:  Normal effort.  CTA B. Abd:  Nontender.  No distention.  Other:  Posterior oropharynx clear.  No tongue or lip angioedema.  Postop site with moderate swelling, some serosanguineous discharge expressed from lateral aspect.   ED Results / Procedures / Treatments  Labs (all labs ordered are listed, but only abnormal results are displayed) Labs Reviewed  BASIC METABOLIC PANEL - Abnormal; Notable for the following components:      Result Value   Potassium 3.4 (*)    Chloride 97 (*)    All other components within normal limits  CBC - Abnormal; Notable for the following components:   WBC 14.7 (*)    All other components within normal limits  TROPONIN I (HIGH SENSITIVITY)     EKG  ED ECG REPORT I, Han Lysne J, the attending physician,  personally viewed and interpreted this ECG.   Date: 08/23/2022  EKG Time: 2250  Rate: 99  Rhythm: normal sinus rhythm  Axis: Normal  Intervals:none  ST&T Change: Nonspecific    RADIOLOGY I have independently visualized and interpreted patient's chest xray as well as noted the radiology interpretation:  Chest xray: No acute cardiopulmonary process  Official radiology report(s): DG Chest Portable 1 View  Result Date: 08/23/2022 CLINICAL DATA:  Chest pain, recent ACDF EXAM: PORTABLE CHEST 1 VIEW COMPARISON:  CT chest dated 10/23/2021 FINDINGS: Lungs are clear.  No pleural effusion or pneumothorax. The heart is normal in size. IMPRESSION: No evidence of acute cardiopulmonary disease. Electronically Signed   By: Julian Hy M.D.   On: 08/23/2022 23:20     PROCEDURES:  Critical Care performed: No  Procedures   MEDICATIONS ORDERED IN ED: Medications  sodium chloride 0.9 % bolus 500 mL (0 mLs Intravenous Stopped 08/24/22 0050)  HYDROmorphone (DILAUDID) injection 0.5 mg (0.5 mg Intravenous Given 08/23/22 2356)  ondansetron (ZOFRAN) injection 4 mg (4 mg Intravenous Given 08/23/22 2356)  dexamethasone (DECADRON) injection 10 mg (10 mg Intravenous Given 08/23/22 2356)  lidocaine (PF) (XYLOCAINE) 1 % injection 5 mL (5 mLs Intradermal Given 08/24/22 0050)     IMPRESSION / MDM / ASSESSMENT AND PLAN / ED COURSE  I reviewed the triage vital signs and the nursing notes.                             61y/o male presenting with post-operative neck swelling and bleeding. Differential diagnosis includes but is not limited to expanding neck hematoma from active bleeding/extravasation, seroma, abscess, etc. I have personally reviewed patient's records and note his op note from 08/21/2022.  Patient's presentation is most consistent with acute presentation with potential threat to life or bodily function.  Laboratory results notable for moderate leukocytosis WBC 14.7, normal electrolytes. Will  discuss with neurosurgery for further recommendations/imaging.  Clinical Course as of 08/24/22 0454  Fri Aug 23, 2022  2351 Spoke with Dr. Cari Caraway from  neurosurgery who does not recommend imaging.  Recommends '10mg'$  IV Decadron and will evaluate patient in the ED. [JS]  Sat Aug 24, 2022  0124 Dr. Cari Caraway evaluated patient at bedside.  Provided staple closure.  Recommends observation 2 to 3 hours and if no rebleeding, may discharge patient home on Medrol Dosepak. [JS]  2951 Patient asleep.  No bleeding through bandage.  Will observe for another hour. [JS]  0420 No bleeding through dressing.  Patient feels significantly better and is eager for discharge home.  Will discharge with Medrol Dosepak as recommended by neurosurgery.  Patient has postop appointment next week.  Strict return precautions given.  Patient and family member verbalized understanding and agree with plan of care. [JS]    Clinical Course User Index [JS] Paulette Blanch, MD     FINAL CLINICAL IMPRESSION(S) / ED DIAGNOSES   Final diagnoses:  Postoperative hematoma of musculoskeletal structure following musculoskeletal procedure     Rx / DC Orders   ED Discharge Orders          Ordered    methylPREDNISolone (MEDROL DOSEPAK) 4 MG TBPK tablet        08/24/22 0420             Note:  This document was prepared using Dragon voice recognition software and may include unintentional dictation errors.   Paulette Blanch, MD 08/24/22 [phone]

## 2022-08-24 DIAGNOSIS — M96840 Postprocedural hematoma of a musculoskeletal structure following a musculoskeletal system procedure: Secondary | ICD-10-CM | POA: Diagnosis not present

## 2022-08-24 DIAGNOSIS — T8189XA Other complications of procedures, not elsewhere classified, initial encounter: Secondary | ICD-10-CM | POA: Diagnosis not present

## 2022-08-24 MED ORDER — LIDOCAINE HCL (PF) 1 % IJ SOLN
INTRAMUSCULAR | Status: AC
  Administered 2022-08-24: 5 mL via INTRADERMAL
  Filled 2022-08-24: qty 5

## 2022-08-24 MED ORDER — LIDOCAINE HCL (PF) 1 % IJ SOLN: 5.0000 mL | Freq: Once | INTRAMUSCULAR | Status: AC

## 2022-08-24 MED ORDER — METHYLPREDNISOLONE 4 MG PO TBPK: ORAL_TABLET | ORAL | 0 refills | Status: DC

## 2022-08-24 NOTE — ED Notes (Signed)
Dr. Izora Ribas at bedside.

## 2022-08-24 NOTE — Discharge Instructions (Signed)
Wound care as directed by your surgeon.  Take steroid taper as prescribed.  Return to the ER for worsening symptoms, persistent vomiting, difficulty breathing, fever or other concerns.

## 2022-08-24 NOTE — Consult Note (Signed)
Referring Physician:  No referring provider defined for this encounter.  Primary Physician:  [name], NP  History of Present Illness: 08/24/2022 Luke Briggs is here today after waking up with some drainage on his shirt.  He had a C5-6 ACDF on 08/21/22.  He is able to swallow and has a strong voice.  Review of Systems:  A 10 point review of systems is negative, except for the pertinent positives and negatives detailed in the HPI.  Past Medical History: Past Medical History:  Diagnosis Date   Angioedema 08/07/2019   Arthritis    knees,shoulders, ankles, most joints   Cervical disc disorder    difficuly moving neck left   COPD (chronic obstructive pulmonary disease) (HCC)    chronic cough and wheezing   Dyspnea    easily   GERD (gastroesophageal reflux disease)    Headache    Hemorrhoids    History of hiatal hernia    Hypertension    controlled on meds   Pneumonia 04/2019   Prostate disorder    PTSD (post-traumatic stress disorder)    Schizophrenia ([place])    Per patient's report.    Past Surgical History: Past Surgical History:  Procedure Laterality Date   ANTERIOR CERVICAL DECOMP/DISCECTOMY FUSION N/A 08/21/2022   Procedure: C5-6 ANTERIOR CERVICAL DISCECTOMY AND FUSION (GLOBUS HEDRON);  Surgeon: Meade Maw, MD;  Location: [facility] ORS;  Service: Neurosurgery;  Laterality: N/A;   CATARACT EXTRACTION W/PHACO Left 03/28/2020   Procedure: CATARACT EXTRACTION PHACO AND INTRAOCULAR LENS PLACEMENT ([place]) LEFT MALYUGIN  MILOOP,   5.29  00:41.0;  Surgeon: Birder Robson, MD;  Location: [place];  Service: Ophthalmology;  Laterality: Left;   COLONOSCOPY WITH PROPOFOL N/A 12/14/2020   Procedure: COLONOSCOPY WITH PROPOFOL;  Surgeon: Jonathon Bellows, MD;  Location: [facility] ENDOSCOPY;  Service: Gastroenterology;  Laterality: N/A;   ESOPHAGOGASTRODUODENOSCOPY (EGD) WITH PROPOFOL N/A 10/18/2021   Procedure: ESOPHAGOGASTRODUODENOSCOPY (EGD) WITH PROPOFOL;   Surgeon: Virgel Manifold, MD;  Location: [facility] ENDOSCOPY;  Service: Endoscopy;  Laterality: N/A;   EYE SURGERY Left    injury from a nail   FOOT SURGERY Left     Allergies: Allergies as of 08/23/2022 - Review Complete 08/23/2022  Allergen Reaction Noted   Lisinopril Swelling 08/08/2019   Iodinated contrast media Hives 02/01/2022   Amlodipine  10/08/2019   Penicillins Rash 04/28/2019    Medications: Current Meds  Medication Sig   atorvastatin (LIPITOR) 10 MG tablet Take 1 tablet (10 mg total) by mouth daily.   Budeson-Glycopyrrol-Formoterol (BREZTRI AEROSPHERE) 160-9-4.8 MCG/ACT AERO Inhale 2 puffs into the lungs in the morning and at bedtime.   celecoxib (CELEBREX) 200 MG capsule Take 1 capsule (200 mg total) by mouth every 12 (twelve) hours.   Cholecalciferol (VITAMIN D3) 25 MCG (1000 UT) CAPS Take 2 capsules (2,000 Units total) by mouth daily.   finasteride (PROSCAR) 5 MG tablet Take 1 tablet (5 mg total) by mouth daily.   methocarbamol (ROBAXIN) 500 MG tablet Take 1 tablet (500 mg total) by mouth every 6 (six) hours as needed for muscle spasms.   montelukast (SINGULAIR) 10 MG tablet Take 10 mg by mouth at bedtime.   olmesartan-hydrochlorothiazide (BENICAR HCT) 20-12.5 MG tablet Take 1 tablet by mouth daily.   omeprazole (PRILOSEC) 40 MG capsule Take 1 capsule (40 mg total) by mouth in the morning and at bedtime.   oxyCODONE (OXY IR/ROXICODONE) 5 MG immediate release tablet Take 1 tablet (5 mg total) by mouth every 4 (four) hours as  needed for moderate pain ((score 4 to 6)).   tamsulosin (FLOMAX) 0.4 MG CAPS capsule Take 1 capsule (0.4 mg total) by mouth daily.    Social History: Social History   Tobacco Use   Smoking status: Every Day    Packs/day: 2.50    Years: 43.00    Total pack years: 107.50    Types: Cigarettes   Smokeless tobacco: Never   Tobacco comments:    1.5PPD 08/02/2022  Vaping Use   Vaping Use: Never used  Substance Use Topics   Alcohol use: Yes     Alcohol/week: 8.0 standard drinks of alcohol    Types: 8 Cans of beer per week    Comment: beer   Drug use: Never    Family Medical History: Family History  Problem Relation Age of Onset   Heart disease Mother    Osteoporosis Mother    Heart disease Father    Emphysema Father    Heart disease Sister    Emphysema Maternal Grandmother    Heart disease Maternal Grandfather    Osteoporosis Sister     Physical Examination: Vitals:   08/23/22 2248 08/24/22 0015  BP: 137/88 138/76  Pulse: 98 83  Resp: 18 18  Temp: 98 F (36.7 C)   SpO2: 96% 96%    General: Patient is well developed, well nourished, calm, collected, and in no apparent distress. Attention to examination is appropriate.  Neck:   Supple. Mild to moderate swelling around incision.  Respiratory: Patient is breathing without any difficulty.   NEUROLOGICAL:     Awake, alert, oriented to person, place, and time.  Speech is clear and fluent. Fund of knowledge is appropriate.   Cranial Nerves: Pupils equal round and reactive to light.  Facial tone is symmetric.  Facial sensation is symmetric. Shoulder shrug is symmetric. Tongue protrusion is midline.  There is no pronator drift.  Voice Strong. Trachea Midline  Incision with punctate serous drainage .  Swelling as expecting.  Small subcutaneous hematoma palpable.  No erythema or induration   Medical Decision Making  Imaging: N/a  I have personally reviewed the images and agree with the above interpretation.  Assessment and Plan: Luke Briggs is a pleasant 61 y.o. male with small area of drainage from his incision.  Voice strong, swallowing well.  - Reassuring exam for post-operative hematoma of any significance - Expected post-operative pain and dysphagia - Incision prepped, 5 ml 1% lidocaine given, then stapled where small drainage noted.  No drainage expressible after this.  Dressing applied. - Observe for 2-3 hours.  If no drainage, dc home with medrol  dosepak.     Jesaiah Fabiano K. Izora Ribas MD, [facility] Neurosurgery

## 2022-08-26 ENCOUNTER — Encounter: Payer: Self-pay | Admitting: Neurosurgery

## 2022-08-26 ENCOUNTER — Telehealth: Payer: Self-pay

## 2022-08-26 NOTE — Patient Outreach (Signed)
  Care Coordination   08/26/2022 Name: Luke Briggs. MRN: [id number] DOB: [birth year]   Care Coordination Outreach Attempts:  An unsuccessful telephone outreach was attempted today to offer the patient information about available care coordination services as a benefit of their health plan.   Follow Up Plan:  Additional outreach attempts will be made to offer the patient care coordination information and services. The patient has worked with the [facility] in the [place] program before.  Encounter Outcome:  No Answer  Care Coordination Interventions Activated:  No   Care Coordination Interventions:  No, not indicated    Noreene Larsson RN, MSN, [facility]  Mobile: [phone]

## 2022-08-26 NOTE — Telephone Encounter (Signed)
Transition Care Management Unsuccessful Follow-up Telephone Call  Date of discharge and from where:  [facility] ER, 08/24/22  Attempts:  1st Attempt  Reason for unsuccessful TCM follow-up call:  Left voice message

## 2022-08-27 NOTE — Telephone Encounter (Signed)
Transition Care Management Unsuccessful Follow-up Telephone Call  Date of discharge and from where:  [facility] ER, 08/24/22  Attempts:  2nd Attempt  Reason for unsuccessful TCM follow-up call:  Left voice message

## 2022-08-28 NOTE — Telephone Encounter (Signed)
Pt returning Brittany's call / please advise

## 2022-08-28 NOTE — Telephone Encounter (Signed)
Transition Care Management Follow-up Telephone Call Date of discharge and from where: 08/23/22, [facility] ED How have you been since you were released from the hospital? Good Any questions or concerns? No  Items Reviewed: Did the pt receive and understand the discharge instructions provided? Yes  Medications obtained and verified? Yes  Other? No  Any new allergies since your discharge? No  Dietary orders reviewed? Yes Do you have support at home? Yes   Home Care and Equipment/Supplies: Were home health services ordered? not applicable If so, what is the name of the agency? N/A  Has the agency set up a time to come to the patient's home? not applicable Were any new equipment or medical supplies ordered?  No What is the name of the medical supply agency? N/A Were you able to get the supplies/equipment? not applicable Do you have any questions related to the use of the equipment or supplies? No  Functional Questionnaire: (I = Independent and D = Dependent) ADLs: I  Bathing/Dressing- I  Meal Prep- I  Eating- I  Maintaining continence- I  Transferring/Ambulation- I  Managing Meds- I  Follow up appointments reviewed:  PCP Hospital f/u appt confirmed? Yes  Scheduled to see Marnee Guarneri on 09/03/22 @ 8:40 am. [facility] f/u appt confirmed? No   Are transportation arrangements needed? No  If their condition worsens, is the pt aware to call PCP or go to the Emergency Dept.? Yes Was the patient provided with contact information for the PCP's office or ED? Yes Was to pt encouraged to call back with questions or concerns? Yes

## 2022-09-01 DIAGNOSIS — Z9889 Other specified postprocedural states: Secondary | ICD-10-CM | POA: Insufficient documentation

## 2022-09-03 ENCOUNTER — Ambulatory Visit: Payer: Medicaid Other | Admitting: Nurse Practitioner

## 2022-09-03 DIAGNOSIS — Z9889 Other specified postprocedural states: Secondary | ICD-10-CM

## 2022-09-03 DIAGNOSIS — R42 Dizziness and giddiness: Secondary | ICD-10-CM

## 2022-09-03 DIAGNOSIS — Z23 Encounter for immunization: Secondary | ICD-10-CM

## 2022-09-03 DIAGNOSIS — E559 Vitamin D deficiency, unspecified: Secondary | ICD-10-CM

## 2022-09-04 NOTE — Progress Notes (Unsigned)
   REFERRING PHYSICIAN:  Ursula Beath [address] [place],  [place] [address]  DOS: 08/21/22 ACDF C5-C6  HISTORY OF PRESENT ILLNESS: Luke Briggs. is 2 weeks status post ACDF C5-C6. Given celebrex, robaxin, and oxycodone on discharge from the hospital.   Was seen back in ED on 08/23/22 with some drainage from incision. Incision was stapled by Dr. Izora Ribas and he was sent home with medrol dose pack.   He is not taking celebrex or robaxin. He needs a refill of oxycodone.   He has expected posterior cervical pain. No arm pain. No numbness, tingling, or weakness in his arms.    PHYSICAL EXAMINATION:  NEUROLOGICAL:  General: In no acute distress.   Awake, alert, oriented to person, place, and time.  Pupils equal round and reactive to light.  Facial tone is symmetric.  Tongue protrusion is midline.  There is no pronator drift.  Strength: Side Biceps Triceps Deltoid Interossei Grip Wrist Ext. Wrist Flex.  R '5 5 5 5 5 5 5  '$ L '5 5 5 5 5 5 5   '$ Incision clean, dry and intact. Staples removed prior to my seeing him.   Imaging:  Nothing new to review.   Assessment / Plan: Luke Briggs. is doing well s/p above surgery. Treatment options reviewed with patient and following plan made:   - We discussed activity escalation and I have advised the patient to lift up to 10 pounds until 6 weeks after surgery (until your follow up).   - Reviewed wound care. Staples removed without complication.  - Continue current medications including prn oxycodone. Limited refill given. Reviewed dosing and side effects. PMP reviewed and is appropriate.  - Follow up as scheduled in 4 weeks and prn.   Advised to contact the office if any questions or concerns arise.   Geronimo Boot PA-C Dept of Neurosurgery

## 2022-09-05 ENCOUNTER — Ambulatory Visit (INDEPENDENT_AMBULATORY_CARE_PROVIDER_SITE_OTHER): Payer: Medicaid Other | Admitting: Orthopedic Surgery

## 2022-09-05 ENCOUNTER — Encounter: Payer: Self-pay | Admitting: Orthopedic Surgery

## 2022-09-05 VITALS — BP 138/82 | Temp 97.7°F | Ht 70.0 in | Wt 195.0 lb

## 2022-09-05 DIAGNOSIS — G959 Disease of spinal cord, unspecified: Secondary | ICD-10-CM

## 2022-09-05 DIAGNOSIS — M4802 Spinal stenosis, cervical region: Secondary | ICD-10-CM

## 2022-09-05 DIAGNOSIS — Z981 Arthrodesis status: Secondary | ICD-10-CM

## 2022-09-05 DIAGNOSIS — Z09 Encounter for follow-up examination after completed treatment for conditions other than malignant neoplasm: Secondary | ICD-10-CM

## 2022-09-05 MED ORDER — OXYCODONE HCL 5 MG PO TABS: 5.0000 mg | ORAL_TABLET | Freq: Four times a day (QID) | ORAL | 0 refills | Status: DC | PRN

## 2022-09-16 ENCOUNTER — Encounter (INDEPENDENT_AMBULATORY_CARE_PROVIDER_SITE_OTHER): Payer: Self-pay

## 2022-09-23 ENCOUNTER — Ambulatory Visit
Admission: RE | Admit: 2022-09-23 | Discharge: 2022-09-23 | Disposition: A | Discharge status: Home / Self Care | Payer: Medicaid Other | Source: Ambulatory Visit | Attending: Pulmonary Disease | Admitting: Pulmonary Disease

## 2022-09-23 ENCOUNTER — Encounter: Payer: Self-pay | Admitting: Pulmonary Disease

## 2022-09-23 ENCOUNTER — Ambulatory Visit (INDEPENDENT_AMBULATORY_CARE_PROVIDER_SITE_OTHER): Payer: Medicaid Other | Admitting: Pulmonary Disease

## 2022-09-23 ENCOUNTER — Ambulatory Visit
Admission: RE | Admit: 2022-09-23 | Discharge: 2022-09-23 | Disposition: A | Discharge status: Home / Self Care | Payer: Medicaid Other | Attending: Pulmonary Disease | Admitting: Pulmonary Disease

## 2022-09-23 VITALS — BP 148/90 | HR 94 | Temp 97.7°F | Ht 70.0 in | Wt 195.2 lb

## 2022-09-23 DIAGNOSIS — R0609 Other forms of dyspnea: Secondary | ICD-10-CM | POA: Insufficient documentation

## 2022-09-23 MED ORDER — DOXYCYCLINE HYCLATE 100 MG PO TABS: 100.0000 mg | ORAL_TABLET | Freq: Two times a day (BID) | ORAL | 0 refills | Status: DC

## 2022-09-23 MED ORDER — PREDNISONE 10 MG PO TABS: ORAL_TABLET | ORAL | 0 refills | Status: DC

## 2022-09-23 NOTE — Progress Notes (Signed)
[facility]  Chief Complaint  Patient presents with   Follow-up    Breathing has worsen since last OV. C/o bilateral chest discomfort, wheezing at night, SOB with exertion. No PFT or ONO.     Past Surgical History:  He  has a past surgical history that includes Eye surgery (Left); Foot surgery (Left); Cataract extraction w/PHACO (Left, 03/28/2020); Colonoscopy with propofol (N/A, 12/14/2020); Esophagogastroduodenoscopy (egd) with propofol (N/A, 10/18/2021); and Anterior cervical decomp/discectomy fusion (N/A, 08/21/2022).  Past Medical History:  ACE inhibitor angioedema, Arthritis, GERD, Hiatal hernia, HTN, PNA, PTSD, Schizophrenia, BPH, Cervical stenosis, CVA  Constitutional:  BP (!) 148/90 (BP Location: Left Arm, Cuff Size: Normal)   Pulse 94   Temp 97.7 F (36.5 C) (Temporal)   Ht '5\' 10"'$  (1.778 m)   Wt 195 lb 3.2 oz (88.5 kg)   SpO2 91%   BMI 28.01 kg/m   Brief Summary:  Luke Briggs. is a 61 y.o. male smoker with emphysema and chronic bronchitis.      Subjective:   He continues to smoke cigarettes.  He isn't sure if inhalers help.    He has been feeling more short of breath.  Has chest congestion and soreness.  He is getting a cough with clear to yellow sputum. No leg swelling.    He maintained his SpO2 on room air while walking today.  Physical Exam:   Appearance - well kempt   ENMT - no sinus tenderness, no oral exudate, no LAN, Mallampati 3 airway, no stridor  Respiratory - scattered rhonchi  CV - s1s2 regular rate and rhythm, no murmurs  Ext - no clubbing, no edema  Skin - no rashes  Psych - normal mood and affect     Pulmonary testing:  Spirometry 05/28/22 >> FEV1 2.56 (70%), FEV1% 79 A1AT 08/02/22 >> 133, MM  Chest Imaging:  LDCT chest 10/24/21 >> centrilobular and paraseptal emphysema, scattered tiny nodules  Sleep Tests:    Cardiac Tests:  Echo 11/18/19 >> EF 60 to 65%  Social History:  He   reports that he has been smoking cigarettes. He has a 112.50 pack-year smoking history. He has never used smokeless tobacco. He reports current alcohol use of about 8.0 standard drinks of alcohol per week. He reports that he does not use drugs.  Family History:  His family history includes Emphysema in his father and maternal grandmother; Heart disease in his father, maternal grandfather, mother, and sister; Osteoporosis in his mother and sister.     Assessment/Plan:   Emphysema with chronic bronchitis. - he has acute exacerbation - will get chest xray - will give course of prednisone and doxycycline - continue breztri and prn albuterol - he didn't find using a spacer device to be helpful - PFT and overnight oximetry on room air pending  Tobacco abuse. - discussed several options for smoking cessation - will try to gradually quit, and might consider electronic cigarettes  Hypertension. - blood pressure remains elevated today - he has been seen by nephrology and cardiology with [facility] in [place] - advised he might need follow up with cardiology if his dyspnea persists  Time Spent Involved in Patient Care on Day of Examination:  37 minutes  Follow up:   Patient Instructions  Prednisone 10 mg pill >> 3 pills daily for 2 days, 2 pills daily for 2 days, 1 pill daily for 2 days  Doxycycline 100 mg twice per day for 7 days  Chest xray  today  Follow up in 4 weeks with Dr. Halford Chessman or Nurse Practitioner  Medication List:   Allergies as of 09/23/2022       Reactions   Lisinopril Swelling   Reaction: angioedema   Iodinated Contrast Media Hives   Amlodipine    Dizziness and edema with this   Penicillins Rash   Reaction: Feels like skin is on fire Did it involve swelling of the face/tongue/throat, SOB, or low BP? No Did it involve sudden or severe rash/hives, skin peeling, or any reaction on the inside of your mouth or nose? No Did you need to seek medical attention at a hospital  or doctor's office? Yes When did it last happen? 30 years ago If all above answers are "NO", may proceed with cephalosporin use.        Medication List        Accurate as of September 23, 2022 11:45 AM. If you have any questions, ask your nurse or doctor.          STOP taking these medications    oxyCODONE 5 MG immediate release tablet Commonly known as: Oxy IR/ROXICODONE Stopped by: Chesley Mires, MD       TAKE these medications    acetaminophen 325 MG tablet Commonly known as: TYLENOL Take 2 tablets (650 mg total) by mouth every 4 (four) hours as needed for mild pain ((score 1 to 3) or temp > 100.5).   albuterol 108 (90 Base) MCG/ACT inhaler Commonly known as: VENTOLIN HFA Inhale 2 puffs into the lungs every 6 (six) hours as needed for wheezing orshortness of breath.   atorvastatin 10 MG tablet Commonly known as: LIPITOR Take 1 tablet (10 mg total) by mouth daily.   Breztri Aerosphere 160-9-4.8 MCG/ACT Aero Generic drug: Budeson-Glycopyrrol-Formoterol Inhale 2 puffs into the lungs in the morning and at bedtime.   doxycycline 100 MG tablet Commonly known as: VIBRA-TABS Take 1 tablet (100 mg total) by mouth 2 (two) times daily. Started by: Chesley Mires, MD   EPINEPHrine 0.3 mg/0.3 mL Soaj injection Commonly known as: EPI-PEN Inject 0.3 mLs (0.3 mg total) into the muscle as needed for anaphylaxis.   finasteride 5 MG tablet Commonly known as: PROSCAR Take 1 tablet (5 mg total) by mouth daily.   montelukast 10 MG tablet Commonly known as: SINGULAIR Take 10 mg by mouth at bedtime.   olmesartan-hydrochlorothiazide 20-12.5 MG tablet Commonly known as: BENICAR HCT Take 1 tablet by mouth daily.   omeprazole 40 MG capsule Commonly known as: PRILOSEC Take 1 capsule (40 mg total) by mouth in the morning and at bedtime.   predniSONE 10 MG tablet Commonly known as: DELTASONE Take 3 tablets (30 mg total) by mouth daily with breakfast for 2 days, THEN 2 tablets (20  mg total) daily with breakfast for 2 days, THEN 1 tablet (10 mg total) daily with breakfast for 2 days. Start taking on: September 23, 2022 Started by: Chesley Mires, MD   tadalafil 5 MG tablet Commonly known as: CIALIS TAKE ONE TABLET BY MOUTH DAILY AS NEEDED FOR ERECTILE DYSFUNCTION   tamsulosin 0.4 MG Caps capsule Commonly known as: FLOMAX Take 1 capsule (0.4 mg total) by mouth daily.   Vitamin D3 25 MCG (1000 UT) Caps Take 2 capsules (2,000 Units total) by mouth daily.        Signature:  Chesley Mires, MD [place] Pager - [phone] 09/23/2022, 11:45 AM

## 2022-09-23 NOTE — Patient Instructions (Signed)
Prednisone 10 mg pill >> 3 pills daily for 2 days, 2 pills daily for 2 days, 1 pill daily for 2 days  Doxycycline 100 mg twice per day for 7 days  Chest xray today  Follow up in 4 weeks with Dr. Halford Chessman or Nurse Practitioner

## 2022-09-29 NOTE — Patient Instructions (Signed)
Dizziness Dizziness is a common problem. It makes you feel unsteady or light-headed. You may feel like you are about to pass out (faint). Dizziness can lead to getting hurt if you stumble or fall. Dizziness can be caused by many things, including: Medicines. Not having enough water in your body (dehydration). Illness. Follow these instructions at home: Eating and drinking  Drink enough fluid to keep your pee (urine) pale yellow. This helps to keep you from getting dehydrated. Try to drink more clear fluids, such as water. Do not drink alcohol. Limit how much caffeine you drink or eat, if your doctor tells you to do that. Limit how much salt (sodium) you drink or eat, if your doctor tells you to do that. Activity  Avoid making quick movements. Stand up slowly from sitting in a chair, and steady yourself until you feel okay. In the morning, first sit up on the side of the bed. When you feel okay, stand up slowly while you hold onto something. Do this until you know that your balance is okay. If you need to stand in one place for a long time, move your legs often. Tighten and relax the muscles in your legs while you are standing. Do not drive or use machinery if you feel dizzy. Avoid bending down if you feel dizzy. Place items in your home so you can reach them easily without leaning over. Lifestyle Do not smoke or use any products that contain nicotine or tobacco. If you need help quitting, ask your doctor. Try to lower your stress level. You can do this by using methods such as yoga or meditation. Talk with your doctor if you need help. General instructions Watch your dizziness for any changes. Take over-the-counter and prescription medicines only as told by your doctor. Talk with your doctor if you think that you are dizzy because of a medicine that you are taking. Tell a friend or a family member that you are feeling dizzy. If he or she notices any changes in your behavior, have this  person call your doctor. Keep all follow-up visits. Contact a doctor if: Your dizziness does not go away. Your dizziness or light-headedness gets worse. You feel like you may vomit (are nauseous). You have trouble hearing. You have new symptoms. You are unsteady on your feet. You feel like the room is spinning. You have neck pain or a stiff neck. You have a fever. Get help right away if: You vomit or have watery poop (diarrhea), and you cannot eat or drink anything. You have trouble: Talking. Walking. Swallowing. Using your arms, hands, or legs. You feel generally weak. You are not thinking clearly, or you have trouble forming sentences. A friend or family member may notice this. You have: Chest pain. Pain in your belly (abdomen). Shortness of breath. Sweating. Your vision changes. You are bleeding. You have a very bad headache. These symptoms may be an emergency. Get help right away. Call your local emergency services (911 in the [place] Do not wait to see if the symptoms will go away. Do not drive yourself to the hospital. Summary Dizziness makes you feel unsteady or light-headed. You may feel like you are about to pass out (faint). Drink enough fluid to keep your pee (urine) pale yellow. Do not drink alcohol. Avoid making quick movements if you feel dizzy. Watch your dizziness for any changes. This information is not intended to replace advice given to you by your health care provider. Make sure you discuss any questions   you have with your health care provider. Document Revised: 10/09/2020 Document Reviewed: 10/09/2020 Elsevier Patient Education  2023 Elsevier Inc.  

## 2022-10-03 ENCOUNTER — Ambulatory Visit: Payer: Medicaid Other | Admitting: Nurse Practitioner

## 2022-10-03 ENCOUNTER — Encounter: Payer: Medicaid Other | Admitting: Neurosurgery

## 2022-10-03 ENCOUNTER — Other Ambulatory Visit: Payer: Self-pay

## 2022-10-03 ENCOUNTER — Encounter: Payer: Self-pay | Admitting: Nurse Practitioner

## 2022-10-03 VITALS — BP 117/78 | HR 94 | Temp 97.6°F | Ht 70.0 in | Wt 194.3 lb

## 2022-10-03 DIAGNOSIS — F10288 Alcohol dependence with other alcohol-induced disorder: Secondary | ICD-10-CM | POA: Diagnosis not present

## 2022-10-03 DIAGNOSIS — J438 Other emphysema: Secondary | ICD-10-CM | POA: Diagnosis not present

## 2022-10-03 DIAGNOSIS — F17219 Nicotine dependence, cigarettes, with unspecified nicotine-induced disorders: Secondary | ICD-10-CM

## 2022-10-03 DIAGNOSIS — Z23 Encounter for immunization: Secondary | ICD-10-CM | POA: Diagnosis not present

## 2022-10-03 DIAGNOSIS — E876 Hypokalemia: Secondary | ICD-10-CM

## 2022-10-03 DIAGNOSIS — I10 Essential (primary) hypertension: Secondary | ICD-10-CM

## 2022-10-03 DIAGNOSIS — E782 Mixed hyperlipidemia: Secondary | ICD-10-CM

## 2022-10-03 DIAGNOSIS — G959 Disease of spinal cord, unspecified: Secondary | ICD-10-CM | POA: Diagnosis not present

## 2022-10-03 DIAGNOSIS — H9313 Tinnitus, bilateral: Secondary | ICD-10-CM

## 2022-10-03 DIAGNOSIS — I700 Atherosclerosis of aorta: Secondary | ICD-10-CM | POA: Diagnosis not present

## 2022-10-03 DIAGNOSIS — R42 Dizziness and giddiness: Secondary | ICD-10-CM

## 2022-10-03 DIAGNOSIS — Z9889 Other specified postprocedural states: Secondary | ICD-10-CM

## 2022-10-03 DIAGNOSIS — E559 Vitamin D deficiency, unspecified: Secondary | ICD-10-CM

## 2022-10-03 DIAGNOSIS — Z981 Arthrodesis status: Secondary | ICD-10-CM

## 2022-10-03 MED ORDER — PREDNISONE 10 MG PO TABS: ORAL_TABLET | ORAL | 0 refills | Status: AC

## 2022-10-03 NOTE — Assessment & Plan Note (Signed)
On 08/21/22 -- continue collaboration with neurosurgery, recent notes reviewed.

## 2022-10-03 NOTE — Assessment & Plan Note (Signed)
Chronic, stable.  BP has been at goal for several months now. Continue current medication regimen as prescribed by Dr. Smith Mince at [facility], will take over these refills and have him return to them as needed.  Continued alcohol use but is cutting back, highly recommend cessation.  Recommend cutting back on alcohol and smoking -- work towards complete cessation.  Recommend he monitor BP at least a few mornings a week at home and document.  DASH diet at home.  Labs today: CBC and CMP.

## 2022-10-03 NOTE — Assessment & Plan Note (Signed)
I have recommended complete cessation of tobacco use. I have discussed various options available for assistance with tobacco cessation including over the counter methods (Nicotine gum, patch and lozenges). We also discussed prescription options (Chantix, Nicotine Inhaler / Nasal Spray). The patient is not interested in pursuing any prescription tobacco cessation options at this time.  Continue yearly lung CA screening.  

## 2022-10-03 NOTE — Assessment & Plan Note (Signed)
Ongoing.  Recent surgery.  Followed by neurosurgery.  Appreciate their input. ?dizziness and numbness related to this. 

## 2022-10-03 NOTE — Assessment & Plan Note (Signed)
Ongoing issue post CVA -- will get him back into neuro and ENT for assessment in new year, refuses now.  Suspect some related to heavier alcohol use, recommend he cut back and work towards cessation.

## 2022-10-03 NOTE — Addendum Note (Signed)
Addended by: Georgina Peer on: 10/03/2022 09:31 AM   Modules accepted: Orders

## 2022-10-03 NOTE — Progress Notes (Signed)
BP 117/78   Pulse 94   Temp 97.6 F (36.4 C) (Oral)   Ht '5\' 10"'$  (1.778 m)   Wt 194 lb 4.8 oz (88.1 kg)   SpO2 98%   BMI 27.88 kg/m    Subjective:    Patient ID: Luke Pollen., male    DOB: [birth year], 61 y.o.   MRN: [id number]  HPI: Luke Briggs. is a 61 y.o. male  Chief Complaint  Patient presents with   Dizziness    Started about a month ago, patient states that it is getting worse and feels weak, that his heart is going to blow out of his chest, and ear ringing.    FATIGUE/DIZZINESS/NUMBNESS Ongoing chronic issues with fatigue/numbness, tinnitus, and dizziness -- has had lengthy work-up.  Known chronic right basal ganglia lacunar infarct on imaging and continues to complain of ongoing tinnitus, fatigue, numbness, and intermittent dizziness -- sees ENT & neuro for this and last saw Dr. Melrose Nakayama 03/08/21. He reports ongoing issues, has not been back to see neurology as instructed.  Recent MRI brain on 01/25/22 showed no acute changes and small remote lacunar infarct.  Last vascular visit 02/01/21 with ultrasound showing widely patent RICA and 32% LICA stenosis -- to return for imaging on 02/17/23.  Does follow neurosurgery for cervical myelopathy and had C5-C6 anterior cervical discectomy on 08/21/22, followed up with them on 09/05/22.   Drinking alcohol nightly -- 4 to 6 [employer] -- drank 30 beers on Monday. Continues to report drinking helps his tinnitus and reports he feels worse if he does not drink.  Reports about 4 beers and his ears stop ringing + feels more energy. Duration:  chronic Severity: 9/10  Onset: gradual Context when symptoms started:  unknown Symptoms improve with rest: no  Depressive symptoms: yes Stress/anxiety: no Insomnia: yes hard to fall asleep Snoring: no Observed apnea by bed partner: no Daytime hypersomnolence:yes Wakes feeling refreshed: no History of sleep study: no Dysnea on exertion:  yes - seeing pulmonary Orthopnea/PND:  no Chest pain: no Chronic cough: no Lower extremity edema: no Arthralgias:no Myalgias: no Weakness: yes Rash: no   COPD Uses Breztri and Albuterol as needed.  Currently followed with pulmonary and saw last 09/23/22 -- recent exacerbation, pulmonary sent abx and Prednisone but he did not take as abx causes diarrhea.  Taking Montelukast.  Had last lung screening on 10/23/21 -- noting emphysema and aortic atherosclerosis.    Continues to smoke 1 PPD, has been smoking for >45 years.  Not interested in quitting.   Satisfied with current treatment?: yes Oxygen use: no Dyspnea frequency: minimal Cough frequency: none Rescue inhaler frequency:  2-3 times a day Limitation of activity: no Productive cough: none Last Spirometry: with pulmonary Pneumovax: Not up to Date Influenza: Up to Date  HYPERTENSION / HYPERLIPIDEMIA Continues on Benicar which offers benefit.  Last saw cardiology 02/12/22. Satisfied with current treatment? yes Duration of hypertension: chronic BP monitoring frequency: not checking BP range:  BP medication side effects: no Duration of hyperlipidemia: chronic Cholesterol medication side effects: no Cholesterol supplements: none Medication compliance: poor compliance Aspirin: no Recent stressors: no Recurrent headaches: no Visual changes: no Palpitations:  occasional faster beats Dyspnea: yes Chest pain: no Lower extremity edema: no Dizzy/lightheaded: yes    Relevant past medical, surgical, family and social history reviewed and updated as indicated. Interim medical history since our last visit reviewed. Allergies and medications reviewed and updated.  Review of Systems  Constitutional:  Positive for  fatigue. Negative for activity change, chills, diaphoresis and fever.  Respiratory:  Positive for shortness of breath. Negative for cough, chest tightness and wheezing.   Cardiovascular:  Negative for chest pain, palpitations and leg swelling.  Gastrointestinal:  Negative.   Endocrine: Negative.   Neurological:  Positive for dizziness and numbness. Negative for syncope, weakness, light-headedness and headaches.  Psychiatric/Behavioral: Negative.     Per HPI unless specifically indicated above     Objective:    BP 117/78   Pulse 94   Temp 97.6 F (36.4 C) (Oral)   Ht '5\' 10"'$  (1.778 m)   Wt 194 lb 4.8 oz (88.1 kg)   SpO2 98%   BMI 27.88 kg/m   Wt Readings from Last 3 Encounters:  10/03/22 194 lb 4.8 oz (88.1 kg)  09/23/22 195 lb 3.2 oz (88.5 kg)  09/05/22 195 lb (88.5 kg)    Physical Exam Vitals and nursing note reviewed.  Constitutional:      General: He is awake. He is not in acute distress.    Appearance: He is well-developed and well-groomed. He is not ill-appearing or toxic-appearing.  HENT:     Head: Normocephalic and atraumatic.     Right Ear: Hearing normal. No drainage.     Left Ear: Hearing normal. No drainage.  Eyes:     General: Lids are normal.        Right eye: No discharge.        Left eye: No discharge.     Conjunctiva/sclera: Conjunctivae normal.     Pupils: Pupils are equal, round, and reactive to light.     Visual Fields: Right eye visual fields normal and left eye visual fields normal.  Neck:     Thyroid: No thyromegaly.     Vascular: No carotid bruit.  Cardiovascular:     Rate and Rhythm: Normal rate and regular rhythm.     Heart sounds: Normal heart sounds, S1 normal and S2 normal. No murmur heard.    No gallop.  Pulmonary:     Effort: Pulmonary effort is normal. No accessory muscle usage or respiratory distress.     Breath sounds: Wheezing present. No decreased breath sounds or rhonchi.     Comments: Scattered expiratory wheezes throughout per baseline. Abdominal:     General: Bowel sounds are normal. There is no distension.     Palpations: Abdomen is soft. There is no hepatomegaly.     Tenderness: There is no abdominal tenderness.     Hernia: No hernia is present.  Musculoskeletal:        General:  Normal range of motion.     Cervical back: Normal range of motion and neck supple.     Right lower leg: No edema.     Left lower leg: No edema.  Skin:    General: Skin is warm and dry.     Capillary Refill: Capillary refill takes less than 2 seconds.     Findings: No rash.  Neurological:     Mental Status: He is alert and oriented to person, place, and time.     Cranial Nerves: Cranial nerves 2-12 are intact.     Sensory: Sensation is intact.     Motor: Motor function is intact.     Coordination: Coordination is intact.     Gait: Gait is intact.     Deep Tendon Reflexes: Reflexes are normal and symmetric.     Reflex Scores:      Brachioradialis reflexes are 2+ on the  right side and 2+ on the left side.      Patellar reflexes are 2+ on the right side and 2+ on the left side. Psychiatric:        Attention and Perception: Attention normal.        Mood and Affect: Mood normal.        Speech: Speech normal.        Behavior: Behavior normal. Behavior is cooperative.        Thought Content: Thought content normal.    Results for orders placed or performed during the hospital encounter of 73/22/02  Basic metabolic panel  Result Value Ref Range   Sodium 135 135 - 145 mmol/L   Potassium 3.4 (L) 3.5 - 5.1 mmol/L   Chloride 97 (L) 98 - 111 mmol/L   CO2 28 22 - 32 mmol/L   Glucose, Bld 99 70 - 99 mg/dL   BUN 10 6 - 20 mg/dL   Creatinine, Ser 0.74 0.61 - 1.24 mg/dL   Calcium 9.0 8.9 - 10.3 mg/dL   GFR, Estimated >60 >60 mL/min   Anion gap 10 5 - 15  CBC  Result Value Ref Range   WBC 14.7 (H) 4.0 - 10.5 K/uL   RBC 4.64 4.22 - 5.81 MIL/uL   Hemoglobin 14.4 13.0 - 17.0 g/dL   HCT 41.6 39.0 - 52.0 %   MCV 89.7 80.0 - 100.0 fL   MCH 31.0 26.0 - 34.0 pg   MCHC 34.6 30.0 - 36.0 g/dL   RDW 12.1 11.5 - 15.5 %   Platelets 319 150 - 400 K/uL   nRBC 0.0 0.0 - 0.2 %  Troponin I (High Sensitivity)  Result Value Ref Range   Troponin I (High Sensitivity) 5 <18 ng/L      Assessment & Plan:    Problem List Items Addressed This Visit       Cardiovascular and Mediastinum   Aortic atherosclerosis (HCC)    Chronic, noted on CT lung screening.  Continue statin therapy + recommend complete cessation of smoking.  Lipid panel up to date.      Essential hypertension    Chronic, stable.  BP has been at goal for several months now. Continue current medication regimen as prescribed by Dr. Smith Mince at [facility], will take over these refills and have him return to them as needed.  Continued alcohol use but is cutting back, highly recommend cessation.  Recommend cutting back on alcohol and smoking -- work towards complete cessation.  Recommend he monitor BP at least a few mornings a week at home and document.  DASH diet at home.  Labs today: CBC and CMP.         Respiratory   Paraseptal emphysema (HCC) - Primary    Chronic, ongoing.  Continue to collaborate with pulmonary, as he continues to complain of SOB.  Continue Breztri BID and Albuterol as needed at this time.  Continue yearly lung screening -- scheduled December 2023.  Recommend complete cessation of smoking. Will send in Prednisone as he did not take this as ordered with pulmonary and after education on use in exacerbations, he wishes to take it.      Relevant Medications   predniSONE (DELTASONE) 10 MG tablet     Nervous and Auditory   Cervical myelopathy (HCC)    Ongoing.  Recent surgery.  Followed by neurosurgery.  Appreciate their input. ?dizziness and numbness related to this.      Nicotine dependence, cigarettes, w unsp disorders  I have recommended complete cessation of tobacco use. I have discussed various options available for assistance with tobacco cessation including over the counter methods (Nicotine gum, patch and lozenges). We also discussed prescription options (Chantix, Nicotine Inhaler / Nasal Spray). The patient is not interested in pursuing any prescription tobacco cessation options at this time.  Continue yearly lung  CA screening.         Other   Alcohol dependence ([place])    Recommend continued cutting back -- concern for overall health with ongoing heavy use and suspect major reason for his overall feeling bad.  Is aware of effect of heavy alcohol use on overall health, especially BP.   Check labs today to include CMP, CBC.      Chronic vertigo    Ongoing issue post CVA -- will get him back into neuro and ENT for assessment in new year, refuses now.  Suspect some related to heavier alcohol use, recommend he cut back and work towards cessation.      Relevant Orders   CBC with Differential/Platelet   Comprehensive metabolic panel   Mixed hyperlipidemia    Chronic, ongoing.  Continue current medication regimen and adjust as needed.  Lipid panel up to date.       S/P cervical discectomy    On 08/21/22 -- continue collaboration with neurosurgery, recent notes reviewed.      Tinnitus of both ears    Ongoing issue since CVA with episodes daily.  Suspect more related to past CVA and heavy drinking.  Referral to return to ENT in new year, refuses to return today.      Other Visit Diagnoses     Hypokalemia       Low recent labs, recheck and start supplement as needed.   Relevant Orders   Comprehensive metabolic panel   Vitamin D deficiency       Check on labs today, history of low, start supplement as needed.   Relevant Orders   VITAMIN D 25 Hydroxy (Vit-D Deficiency, Fractures)        Follow up plan: Return in about 3 months (around 01/03/2023) for DIZZINESS, HTN/HLD, MOOD, COPD, GERD, ALCOHOL USE.

## 2022-10-03 NOTE — Assessment & Plan Note (Signed)
Chronic, noted on CT lung screening.  Continue statin therapy + recommend complete cessation of smoking.  Lipid panel up to date.

## 2022-10-03 NOTE — Assessment & Plan Note (Signed)
Recommend continued cutting back -- concern for overall health with ongoing heavy use and suspect major reason for his overall feeling bad.  Is aware of effect of heavy alcohol use on overall health, especially BP.   Check labs today to include CMP, CBC.

## 2022-10-03 NOTE — Assessment & Plan Note (Signed)
Chronic, ongoing.  Continue current medication regimen and adjust as needed.  Lipid panel up to date. 

## 2022-10-03 NOTE — Assessment & Plan Note (Signed)
Chronic, ongoing.  Continue to collaborate with pulmonary, as he continues to complain of SOB.  Continue Breztri BID and Albuterol as needed at this time.  Continue yearly lung screening -- scheduled December 2023.  Recommend complete cessation of smoking. Will send in Prednisone as he did not take this as ordered with pulmonary and after education on use in exacerbations, he wishes to take it.

## 2022-10-03 NOTE — Assessment & Plan Note (Signed)
Ongoing issue since CVA with episodes daily.  Suspect more related to past CVA and heavy drinking.  Referral to return to ENT in new year, refuses to return today.

## 2022-10-04 ENCOUNTER — Encounter: Payer: Self-pay | Admitting: Nurse Practitioner

## 2022-10-04 LAB — CBC WITH DIFFERENTIAL/PLATELET
Basophils Absolute: 0 10*3/uL (ref 0.0–0.2)
Basos: 0 %
EOS (ABSOLUTE): 0.1 10*3/uL (ref 0.0–0.4)
Eos: 1 %
Hematocrit: 43.4 % (ref 37.5–51.0)
Hemoglobin: 15 g/dL (ref 13.0–17.7)
Immature Grans (Abs): 0 10*3/uL (ref 0.0–0.1)
Immature Granulocytes: 0 %
Lymphocytes Absolute: 1.7 10*3/uL (ref 0.7–3.1)
Lymphs: 18 %
MCH: 31.1 pg (ref 26.6–33.0)
MCHC: 34.6 g/dL (ref 31.5–35.7)
MCV: 90 fL (ref 79–97)
Monocytes Absolute: 1 10*3/uL — ABNORMAL HIGH (ref 0.1–0.9)
Monocytes: 10 %
Neutrophils Absolute: 6.5 10*3/uL (ref 1.4–7.0)
Neutrophils: 71 %
Platelets: 366 10*3/uL (ref 150–450)
RBC: 4.83 x10E6/uL (ref 4.14–5.80)
RDW: 12.2 % (ref 11.6–15.4)
WBC: 9.3 10*3/uL (ref 3.4–10.8)

## 2022-10-04 LAB — COMPREHENSIVE METABOLIC PANEL
ALT: 36 IU/L (ref 0–44)
AST: 36 IU/L (ref 0–40)
Albumin/Globulin Ratio: 1.6 (ref 1.2–2.2)
Albumin: 4.2 g/dL (ref 3.8–4.9)
Alkaline Phosphatase: 101 IU/L (ref 44–121)
BUN/Creatinine Ratio: 8 — ABNORMAL LOW (ref 9–20)
BUN: 9 mg/dL (ref 6–24)
Bilirubin Total: 0.8 mg/dL (ref 0.0–1.2)
CO2: 29 mmol/L (ref 20–29)
Calcium: 9.2 mg/dL (ref 8.7–10.2)
Chloride: 91 mmol/L — ABNORMAL LOW (ref 96–106)
Creatinine, Ser: 1.17 mg/dL (ref 0.76–1.27)
Globulin, Total: 2.7 g/dL (ref 1.5–4.5)
Glucose: 87 mg/dL (ref 70–99)
Potassium: 3.5 mmol/L (ref 3.5–5.2)
Sodium: 133 mmol/L — ABNORMAL LOW (ref 134–144)
Total Protein: 6.9 g/dL (ref 6.0–8.5)
eGFR: 72 mL/min/{1.73_m2} (ref >59)

## 2022-10-04 LAB — VITAMIN D 25 HYDROXY (VIT D DEFICIENCY, FRACTURES): Vit D, 25-Hydroxy: 28.7 ng/mL — ABNORMAL LOW (ref 30.0–100.0)

## 2022-10-04 NOTE — Progress Notes (Signed)
Contacted via [place] afternoon Luke Briggs -- we discussed the major things I wished to discuss in your MyChart message -- remember a little tablet salt added daily, not a ton, and watch water intake.  Vitamin D remains a little low, make sure you are taking Vitamin D3 2000 units daily for bone health. - Kidney function, creatinine and eGFR, remains normal, as is liver function, AST and ALT.  CBC remains stable.  Any questions? Keep being awesome!!  Thank you for allowing me to participate in your care.  I appreciate you. Kindest regards, Mikolaj Woolstenhulme

## 2022-10-07 ENCOUNTER — Other Ambulatory Visit: Payer: Self-pay

## 2022-10-07 MED ORDER — TAMSULOSIN HCL 0.4 MG PO CAPS: 0.4000 mg | ORAL_CAPSULE | Freq: Every day | ORAL | 4 refills | Status: DC

## 2022-10-07 NOTE — Progress Notes (Unsigned)
   REFERRING PHYSICIAN:  Ursula Beath [place],  [place] [address]  DOS: 08/21/22 ACDF C5-C6  HISTORY OF PRESENT ILLNESS: Luke Briggs. is about 6 weeks status post ACDF C5-C6. Doing well at his last visit. He was to continue on prn oxycodone.   He continues with neck pain. No arm pain. No numbness, tingling, or weakness. He does not feel he has improved much since the surgery.   He is not taking any medications for her neck.   He was started on prednisone on 10/03/22 by his PCP for his lungs.   He continues to smoke at least a pack and a half a day.    PHYSICAL EXAMINATION:  NEUROLOGICAL:  General: In no acute distress.   Awake, alert, oriented to person, place, and time.  Pupils equal round and reactive to light.  Facial tone is symmetric.    Strength: Side Biceps Triceps Deltoid Interossei Grip Wrist Ext. Wrist Flex.  R '5 5 5 5 5 5 5  '$ L '5 5 5 5 5 5 5   '$ Incision well healed   Imaging:  Cervical xrays dated 10/08/22:  ACDF C5-C6 with hardware in good position.   Radiology report not available for review.   Assessment / Plan: Luke Briggs. is doing well s/p above surgery. Treatment options reviewed with patient and following plan made:   - We discussed activity escalation and I have advised the patient to lift up to 25 pounds until his follow up with Dr. Izora Ribas.  - Given neck exercises to do on his own at home. We reviewed these.  - Discussed smoking cessation at length. Discussed risk of fusion not healing if he continues to smoke.  - Follow up as scheduled in 6 weeks and prn.   Advised to contact the office if any questions or concerns arise.   Geronimo Boot PA-C Dept of Neurosurgery

## 2022-10-08 ENCOUNTER — Ambulatory Visit
Admission: RE | Admit: 2022-10-08 | Discharge: 2022-10-08 | Disposition: A | Discharge status: Home / Self Care | Payer: Medicaid Other | Attending: Orthopedic Surgery | Admitting: Orthopedic Surgery

## 2022-10-08 ENCOUNTER — Ambulatory Visit (INDEPENDENT_AMBULATORY_CARE_PROVIDER_SITE_OTHER): Payer: Medicaid Other | Admitting: Orthopedic Surgery

## 2022-10-08 ENCOUNTER — Ambulatory Visit
Admission: RE | Admit: 2022-10-08 | Discharge: 2022-10-08 | Disposition: A | Discharge status: Home / Self Care | Payer: Medicaid Other | Source: Ambulatory Visit | Attending: Orthopedic Surgery | Admitting: Orthopedic Surgery

## 2022-10-08 ENCOUNTER — Encounter: Payer: Self-pay | Admitting: Orthopedic Surgery

## 2022-10-08 VITALS — BP 134/84 | Ht 70.0 in | Wt 197.4 lb

## 2022-10-08 DIAGNOSIS — Z981 Arthrodesis status: Secondary | ICD-10-CM | POA: Insufficient documentation

## 2022-10-08 DIAGNOSIS — Z09 Encounter for follow-up examination after completed treatment for conditions other than malignant neoplasm: Secondary | ICD-10-CM

## 2022-10-08 DIAGNOSIS — G959 Disease of spinal cord, unspecified: Secondary | ICD-10-CM

## 2022-10-08 DIAGNOSIS — M4802 Spinal stenosis, cervical region: Secondary | ICD-10-CM

## 2022-10-18 MED ORDER — ALBUTEROL SULFATE HFA 108 (90 BASE) MCG/ACT IN AERS: INHALATION_SPRAY | RESPIRATORY_TRACT | 5 refills | Status: DC

## 2022-10-23 ENCOUNTER — Inpatient Hospital Stay: Admission: RE | Admit: 2022-10-23 | Payer: Medicaid Other | Source: Ambulatory Visit

## 2022-10-23 ENCOUNTER — Ambulatory Visit
Admission: RE | Admit: 2022-10-23 | Discharge: 2022-10-23 | Disposition: A | Discharge status: Home / Self Care | Payer: Medicaid Other | Source: Ambulatory Visit | Attending: Acute Care | Admitting: Acute Care

## 2022-10-23 DIAGNOSIS — F17210 Nicotine dependence, cigarettes, uncomplicated: Secondary | ICD-10-CM | POA: Diagnosis present

## 2022-10-23 DIAGNOSIS — Z87891 Personal history of nicotine dependence: Secondary | ICD-10-CM | POA: Diagnosis not present

## 2022-10-25 ENCOUNTER — Other Ambulatory Visit: Payer: Self-pay

## 2022-10-25 DIAGNOSIS — Z87891 Personal history of nicotine dependence: Secondary | ICD-10-CM

## 2022-10-25 DIAGNOSIS — Z122 Encounter for screening for malignant neoplasm of respiratory organs: Secondary | ICD-10-CM

## 2022-10-25 DIAGNOSIS — F17210 Nicotine dependence, cigarettes, uncomplicated: Secondary | ICD-10-CM

## 2022-11-05 ENCOUNTER — Ambulatory Visit: Payer: Medicaid Other | Attending: Nurse Practitioner

## 2022-11-05 DIAGNOSIS — J439 Emphysema, unspecified: Secondary | ICD-10-CM | POA: Insufficient documentation

## 2022-11-05 DIAGNOSIS — J4489 Other specified chronic obstructive pulmonary disease: Secondary | ICD-10-CM | POA: Diagnosis present

## 2022-11-05 NOTE — Progress Notes (Unsigned)
Patient came to door of lab and saw the body box and proceeded to tell this RT that if he had to get in the box that he would not be doing test. RT attempted to set up testing to be completed sitting outside of the box. Patient sat down in chair for a min or so and proceeded to stand up and state that "he would not be doing this, if he died, he died."  Patient was walked back to cardiopulmonary entrance.

## 2022-11-08 ENCOUNTER — Telehealth: Payer: Self-pay | Admitting: Primary Care

## 2022-11-08 NOTE — Telephone Encounter (Signed)
PT was sched for a PTF but could not do it because he is claustrophobic. He see's he has an appt w/Ms. Volanda Napoleon in the [place] office. Does he need that appt since he did not take the test. Pls call to advise @ [phone]

## 2022-11-08 NOTE — Telephone Encounter (Signed)
Called and spoke to patient. He stated that he was unable to complete PFT due to being claustrophobic. He is scheduled to see Beth on 11/13/2022. He is wanting to know if he should keep scheduled visit. Sx are baseline--SOB and cough is baseline. He is aware to scheduled appointment to discuss persistent sx and possible spiro.  Routing to [employer] as an Pharmacist, hospital

## 2022-11-12 MED ORDER — OLMESARTAN MEDOXOMIL-HCTZ 20-12.5 MG PO TABS: 1.0000 | ORAL_TABLET | Freq: Every day | ORAL | 4 refills | Status: DC

## 2022-11-12 NOTE — Addendum Note (Signed)
Addended by: Marnee Guarneri T on: 11/12/2022 12:28 PM   Modules accepted: Orders

## 2022-11-13 ENCOUNTER — Other Ambulatory Visit: Payer: Self-pay

## 2022-11-13 ENCOUNTER — Ambulatory Visit (INDEPENDENT_AMBULATORY_CARE_PROVIDER_SITE_OTHER): Payer: Medicaid Other | Admitting: Primary Care

## 2022-11-13 VITALS — BP 132/80 | HR 82 | Temp 97.7°F | Ht 70.0 in | Wt 191.2 lb

## 2022-11-13 DIAGNOSIS — F17208 Nicotine dependence, unspecified, with other nicotine-induced disorders: Secondary | ICD-10-CM

## 2022-11-13 DIAGNOSIS — J438 Other emphysema: Secondary | ICD-10-CM | POA: Diagnosis not present

## 2022-11-13 DIAGNOSIS — Z981 Arthrodesis status: Secondary | ICD-10-CM

## 2022-11-13 NOTE — Patient Instructions (Signed)
Recommendations - Try to use your Breztri every morning and evening (tentatively 6 AM and 6 PM- set alarm to help remind you to take inhaler) - Use albuterol 2 puffs every 4-6 hours for breakthrough shortness of breath or wheezing - Mucinex 600 mg 1-2 times daily to loosen chest congestion - Cut down amount you smoke as much as possible   Orders: - ONO on room air re: emphysema  Follow-up: - 6 months with Dr. Halford Chessman or sooner if needed   Steps to Quit Smoking Smoking tobacco is the leading cause of preventable death. It can affect almost every organ in the body. Smoking puts you and people around you at risk for many serious, long-lasting (chronic) diseases. Quitting smoking can be hard, but it is one of the best things that you can do for your health. It is never too late to quit. Do not give up if you cannot quit the first time. Some people need to try many times to quit. Do your best to stick to your quit plan, and talk with your doctor if you have any questions or concerns. How do I get ready to quit? Pick a date to quit. Set a date within the next 2 weeks to give you time to prepare. Write down the reasons why you are quitting. Keep this list in places where you will see it often. Tell your family, friends, and co-workers that you are quitting. Their support is important. Talk with your doctor about the choices that may help you quit. Find out if your health insurance will pay for these treatments. Know the people, places, things, and activities that make you want to smoke (triggers). Avoid them. What first steps can I take to quit smoking? Throw away all cigarettes at home, at work, and in your car. Throw away the things that you use when you smoke, such as ashtrays and lighters. Clean your car. Empty the ashtray. Clean your home, including curtains and carpets. What can I do to help me quit smoking? Talk with your doctor about taking medicines and seeing a counselor. You are more  likely to succeed when you do both. If you are pregnant or breastfeeding: Talk with your doctor about counseling or other ways to quit smoking. Do not take medicine to help you quit smoking unless your doctor tells you to. Quit right away Quit smoking completely, instead of slowly cutting back on how much you smoke over a period of time. Stopping smoking right away may be more successful than slowly quitting. Go to counseling. In-person is best if this is an option. You are more likely to quit if you go to counseling sessions regularly. Take medicine You may take medicines to help you quit. Some medicines need a prescription, and some you can buy over-the-counter. Some medicines may contain a drug called nicotine to replace the nicotine in cigarettes. Medicines may: Help you stop having the desire to smoke (cravings). Help to stop the problems that come when you stop smoking (withdrawal symptoms). Your doctor may ask you to use: Nicotine patches, gum, or lozenges. Nicotine inhalers or sprays. Non-nicotine medicine that you take by mouth. Find resources Find resources and other ways to help you quit smoking and remain smoke-free after you quit. They include: Online chats with a Social worker. Phone quitlines. Printed Furniture conservator/restorer. Support groups or group counseling. Text messaging programs. Mobile phone apps. Use apps on your mobile phone or tablet that can help you stick to your quit plan. Examples of  free services include Quit Guide from the CDC and smokefree.gov  What can I do to make it easier to quit?  Talk to your family and friends. Ask them to support and encourage you. Call a phone quitline, such as 1-800-QUIT-NOW, reach out to support groups, or work with a Social worker. Ask people who smoke to not smoke around you. Avoid places that make you want to smoke, such as: Bars. Parties. Smoke-break areas at work. Spend time with people who do not smoke. Lower the stress in your  life. Stress can make you want to smoke. Try these things to lower stress: Getting regular exercise. Doing deep-breathing exercises. Doing yoga. Meditating. What benefits will I see if I quit smoking? Over time, you may have: A better sense of smell and taste. Less coughing and sore throat. A slower heart rate. Lower blood pressure. Clearer skin. Better breathing. Fewer sick days. Summary Quitting smoking can be hard, but it is one of the best things that you can do for your health. Do not give up if you cannot quit the first time. Some people need to try many times to quit. When you decide to quit smoking, make a plan to help you succeed. Quit smoking right away, not slowly over a period of time. When you start quitting, get help and support to keep you smoke-free. This information is not intended to replace advice given to you by your health care provider. Make sure you discuss any questions you have with your health care provider. Document Revised: 10/26/2021 Document Reviewed: 10/26/2021 Elsevier Patient Education  [employer].

## 2022-11-13 NOTE — Assessment & Plan Note (Addendum)
-   Mild emphysema on CT imaging with diffuse bronchial wall thickening  - Patient was unable to complete PFTs - Plan continue Breztri Aerosphere two puffs q12 hours and prn albuterolhfa 2 puffs every 4-6 hours for breakthrough sob/wheezing - We will re-order ONO to check oxygen levels at night  - Advised patient take mucinex '600mg'$  twice daily when needed to loosen mucus  - Encourage patient stay as active as possible - FU in 6 months with Dr. Halford Chessman or sooner if needed

## 2022-11-13 NOTE — Assessment & Plan Note (Addendum)
-   Strongly encourage smoking cessation, he is not ready to quit at this time and may be hard d/t his psych history. He has not had prior success with nicotine gum or patches. We discussed tapering amount he smokes and he agreed to work on this.  - Continue annual LDCT for lung cancer screening, next due in 10/2023

## 2022-11-13 NOTE — Progress Notes (Signed)
$'@Patient'k$  ID: Luke Briggs., male    DOB: [birth year], 61 y.o.   MRN: [id number]  Chief Complaint  Patient presents with   Follow-up    Unable to complete PFT- SOB with exertion and prod cough with clear to yellow sputum.     Referring provider: [name], NP  HPI: 61 year old male, current smoker. PMH significant for HTN, emphysema, aortic atherosclerosis, GERD, PTSD, hyperlipidemia.    Previous [facility] pulmonary encounter: 09/23/22- Dr. Halford Chessman  He continues to smoke cigarettes.  He isn't sure if inhalers help.    He has been feeling more short of breath.  Has chest congestion and soreness.  He is getting a cough with clear to yellow sputum. No leg swelling.    He maintained his SpO2 on room air while walking today.   Emphysema with chronic bronchitis. - he has acute exacerbation - will get chest xray - will give course of prednisone and doxycycline - continue breztri and prn albuterol - he didn't find using a spacer device to be helpful - PFT and overnight oximetry on room air pending  Tobacco abuse. - discussed several options for smoking cessation - will try to gradually quit, and might consider electronic cigarettes   11/13/2022- interim hx  Patient presents today for follow-up. He was treated for acute exacerbation of COPD in early November with course of Doxycycline and prednisone. He was unable to complete PFTs d/t claustrophobia. He does not require daytime oxygen, I do not have any results from an overnight oximetry test that was ordered back in September by Dr. Halford Chessman. He had a LDCT chest in December that showed mild centrilobular emphysema with diffuse bronchial wall thickening. No acute consolidative airspace disease or lung masses. No new significant pulmonary nodules.   His breathing is baseline. He has no acute complaints today. He experiences dyspnea with exertion. He does not regularly take his Judithann Sauger, forgets at times. He as a chronic cough with  clear-yellow mucus. He is not ready to quit smoking. He never had overnight oximetry test.    Allergies  Allergen Reactions   Lisinopril Swelling    Reaction: angioedema   Iodinated Contrast Media Hives   Amlodipine     Dizziness and edema with this   Penicillins Rash    Reaction: Feels like skin is on fire Did it involve swelling of the face/tongue/throat, SOB, or low BP? No Did it involve sudden or severe rash/hives, skin peeling, or any reaction on the inside of your mouth or nose? No Did you need to seek medical attention at a hospital or doctor's office? Yes When did it last happen? 30 years ago If all above answers are "NO", may proceed with cephalosporin use.     Immunization History  Administered Date(s) Administered   Influenza,inj,Quad PF,6+ Mos 08/17/2019, 08/28/2020, 10/04/2021, 10/03/2022   Moderna Sars-Covid-2 Vaccination 02/19/2020, 03/18/2020, 11/16/2020, 10/08/2021   Pneumococcal Polysaccharide-23 06/28/2021   Td 04/18/2017    Past Medical History:  Diagnosis Date   Angioedema 08/07/2019   Arthritis    knees,shoulders, ankles, most joints   Cervical disc disorder    difficuly moving neck left   COPD (chronic obstructive pulmonary disease) (HCC)    chronic cough and wheezing   Dyspnea    easily   GERD (gastroesophageal reflux disease)    Headache    Hemorrhoids    History of hiatal hernia    Hypertension    controlled on meds   Pneumonia 04/2019   Prostate  disorder    PTSD (post-traumatic stress disorder)    Schizophrenia ([place])    Per patient's report.    Tobacco History: Social History   Tobacco Use  Smoking Status Every Day   Packs/day: 2.50   Years: 45.00   Total pack years: 112.50   Types: Cigarettes  Smokeless Tobacco Never  Tobacco Comments   1.5PPD 09/23/2022   Ready to quit: Not Answered Counseling given: Not Answered Tobacco comments: 1.5PPD 09/23/2022   Outpatient Medications Prior to Visit  Medication Sig Dispense  Refill   acetaminophen (TYLENOL) 325 MG tablet Take 2 tablets (650 mg total) by mouth every 4 (four) hours as needed for mild pain ((score 1 to 3) or temp > 100.5).     albuterol (VENTOLIN HFA) 108 (90 Base) MCG/ACT inhaler Inhale 2 puffs into the lungs every 6 (six) hours as needed for wheezing orshortness of breath. 18 g 5   atorvastatin (LIPITOR) 10 MG tablet Take 1 tablet (10 mg total) by mouth daily. 90 tablet 4   Budeson-Glycopyrrol-Formoterol (BREZTRI AEROSPHERE) 160-9-4.8 MCG/ACT AERO Inhale 2 puffs into the lungs in the morning and at bedtime. 10.7 g 12   Cholecalciferol (VITAMIN D3) 25 MCG (1000 UT) CAPS Take 2 capsules (2,000 Units total) by mouth daily. 180 capsule 4   EPINEPHrine 0.3 mg/0.3 mL IJ SOAJ injection Inject 0.3 mLs (0.3 mg total) into the muscle as needed for anaphylaxis. 1 each 1   finasteride (PROSCAR) 5 MG tablet Take 1 tablet (5 mg total) by mouth daily. 90 tablet 4   montelukast (SINGULAIR) 10 MG tablet Take 10 mg by mouth at bedtime.     olmesartan-hydrochlorothiazide (BENICAR HCT) 20-12.5 MG tablet Take 1 tablet by mouth daily. 90 tablet 4   omeprazole (PRILOSEC) 40 MG capsule Take 1 capsule (40 mg total) by mouth in the morning and at bedtime. 180 capsule 4   tadalafil (CIALIS) 5 MG tablet TAKE ONE TABLET BY MOUTH DAILY AS NEEDED FOR ERECTILE DYSFUNCTION 90 tablet 3   tamsulosin (FLOMAX) 0.4 MG CAPS capsule Take 1 capsule (0.4 mg total) by mouth daily. 90 capsule 4   No facility-administered medications prior to visit.   Review of Systems  Review of Systems  Constitutional: Negative.   HENT: Negative.    Respiratory:  Positive for cough. Negative for chest tightness and wheezing.        DOE  Cardiovascular: Negative.    Physical Exam  BP 132/80 (BP Location: Left Arm, Cuff Size: Normal)   Pulse 82   Temp 97.7 F (36.5 C) (Temporal)   Ht '5\' 10"'$  (1.778 m)   Wt 191 lb 3.2 oz (86.7 kg)   SpO2 100%   BMI 27.43 kg/m  Physical Exam Constitutional:       Appearance: Normal appearance.  HENT:     Head: Normocephalic and atraumatic.     Mouth/Throat:     Mouth: Mucous membranes are moist.     Pharynx: Oropharynx is clear.  Cardiovascular:     Rate and Rhythm: Normal rate and regular rhythm.  Pulmonary:     Effort: Pulmonary effort is normal.     Breath sounds: Normal breath sounds.  Musculoskeletal:        General: Normal range of motion.  Skin:    General: Skin is warm and dry.     Comments: Strong smoke smell to clothes   Neurological:     General: No focal deficit present.     Mental Status: He is alert and oriented  to person, place, and time. Mental status is at baseline.  Psychiatric:        Mood and Affect: Mood normal.        Behavior: Behavior normal.        Thought Content: Thought content normal.        Judgment: Judgment normal.      Lab Results:  CBC    Component Value Date/Time   WBC 9.3 10/03/2022 0909   WBC 14.7 (H) 08/23/2022 2258   RBC 4.83 10/03/2022 0909   RBC 4.64 08/23/2022 2258   HGB 15.0 10/03/2022 0909   HCT 43.4 10/03/2022 0909   PLT 366 10/03/2022 0909   MCV 90 10/03/2022 0909   MCH 31.1 10/03/2022 0909   MCH 31.0 08/23/2022 2258   MCHC 34.6 10/03/2022 0909   MCHC 34.6 08/23/2022 2258   RDW 12.2 10/03/2022 0909   LYMPHSABS 1.7 10/03/2022 0909   MONOABS 0.4 10/30/2020 1604   EOSABS 0.1 10/03/2022 0909   BASOSABS 0.0 10/03/2022 0909    BMET    Component Value Date/Time   NA 133 (L) 10/03/2022 0909   K 3.5 10/03/2022 0909   CL 91 (L) 10/03/2022 0909   CO2 29 10/03/2022 0909   GLUCOSE 87 10/03/2022 0909   GLUCOSE 99 08/23/2022 2258   BUN 9 10/03/2022 0909   CREATININE 1.17 10/03/2022 0909   CALCIUM 9.2 10/03/2022 0909   GFRNONAA >60 08/23/2022 2258   GFRAA 113 01/10/2021 1502    BNP    Component Value Date/Time   BNP 19.0 07/06/2019 1805    ProBNP No results found for: "PROBNP"  Imaging: CT CHEST LUNG CA SCREEN LOW DOSE W/O CM  Result Date: 10/24/2022 CLINICAL DATA:   61 year old asymptomatic male current smoker with 50 pack-year smoking history. EXAM: CT CHEST WITHOUT CONTRAST LOW-DOSE FOR LUNG CANCER SCREENING TECHNIQUE: Multidetector CT imaging of the chest was performed following the standard protocol without IV contrast. RADIATION DOSE REDUCTION: This exam was performed according to the departmental dose-optimization program which includes automated exposure control, adjustment of the mA and/or kV according to patient size and/or use of iterative reconstruction technique. COMPARISON:  10/23/2021 screening chest CT. FINDINGS: Cardiovascular: Normal heart size. No significant pericardial effusion/thickening. Left anterior descending coronary atherosclerosis. Atherosclerotic nonaneurysmal thoracic aorta. Normal caliber pulmonary arteries. Mediastinum/Nodes: No significant thyroid nodules. Unremarkable esophagus. No pathologically enlarged axillary, mediastinal or hilar lymph nodes, noting limited sensitivity for the detection of hilar adenopathy on this noncontrast study. Lungs/Pleura: No pneumothorax. No pleural effusion. Mild centrilobular emphysema with diffuse bronchial wall thickening. No acute consolidative airspace disease or lung masses. No significant growth of numerous previously visualized pulmonary nodules. No new significant pulmonary nodules. Upper abdomen: No acute abnormality. Musculoskeletal: No aggressive appearing focal osseous lesions. Mild thoracic spondylosis. Partially visualized surgical hardware from ACDF. IMPRESSION: 1. Lung-RADS 2, benign appearance or behavior. Continue annual screening with low-dose chest CT without contrast in 12 months. 2. One vessel coronary atherosclerosis. 3. Aortic Atherosclerosis (ICD10-I70.0) and Emphysema (ICD10-J43.9). Electronically Signed   By: Ilona Sorrel M.D.   On: 10/24/2022 14:39    Assessment & Plan:   Paraseptal emphysema ([place]) - Mild emphysema on CT imaging with diffuse bronchial wall thickening  - Patient  was unable to complete PFTs - Plan continue Breztri Aerosphere two puffs q12 hours and prn albuterolhfa 2 puffs every 4-6 hours for breakthrough sob/wheezing - We will re-order ONO to check oxygen levels at night  - Advised patient take mucinex '600mg'$  twice daily when needed to  loosen mucus  - Encourage patient stay as active as possible - FU in 6 months with Dr. Halford Chessman or sooner if needed  Nicotine dependence - Strongly encourage smoking cessation, he is not ready to quit at this time and may be hard d/t his psych history. He has not had prior success with nicotine gum or patches. We discussed tapering amount he smokes and he agreed to work on this.  - Continue annual LDCT for lung cancer screening, next due in 10/2023    Martyn Ehrich, NP 11/13/2022

## 2022-11-14 NOTE — Progress Notes (Signed)
Reviewed and agree with assessment/plan.   Chesley Mires, MD [facility] Pulmonary/Critical Care 11/14/2022, 8:20 AM Pager:  [phone]

## 2022-11-19 ENCOUNTER — Encounter: Payer: Self-pay | Admitting: Neurosurgery

## 2022-11-19 ENCOUNTER — Ambulatory Visit (INDEPENDENT_AMBULATORY_CARE_PROVIDER_SITE_OTHER): Payer: Medicaid Other | Admitting: Neurosurgery

## 2022-11-19 ENCOUNTER — Ambulatory Visit
Admission: RE | Admit: 2022-11-19 | Discharge: 2022-11-19 | Disposition: A | Discharge status: Home / Self Care | Payer: Medicaid Other | Attending: Neurosurgery | Admitting: Neurosurgery

## 2022-11-19 ENCOUNTER — Ambulatory Visit
Admission: RE | Admit: 2022-11-19 | Discharge: 2022-11-19 | Disposition: A | Discharge status: Home / Self Care | Payer: Medicaid Other | Source: Ambulatory Visit | Attending: Neurosurgery | Admitting: Neurosurgery

## 2022-11-19 VITALS — BP 171/89 | HR 76 | Temp 97.9°F | Wt 192.8 lb

## 2022-11-19 DIAGNOSIS — G959 Disease of spinal cord, unspecified: Secondary | ICD-10-CM

## 2022-11-19 DIAGNOSIS — Z981 Arthrodesis status: Secondary | ICD-10-CM

## 2022-11-19 DIAGNOSIS — M4802 Spinal stenosis, cervical region: Secondary | ICD-10-CM

## 2022-11-19 DIAGNOSIS — Z09 Encounter for follow-up examination after completed treatment for conditions other than malignant neoplasm: Secondary | ICD-10-CM

## 2022-11-19 NOTE — Progress Notes (Signed)
   REFERRING PHYSICIAN:  Ursula Beath [address] [place],  [place] [address]  DOS: 08/21/22 ACDF C5-C6  HISTORY OF PRESENT ILLNESS: Luke Briggs. is status post ACDF C5-C6.  His neck has been getting better.  He does have some pain around his left base of thumb.  He has tenderness to palpation in this area.       PHYSICAL EXAMINATION:  NEUROLOGICAL:  General: In no acute distress.   Awake, alert, oriented to person, place, and time.  Pupils equal round and reactive to light.  Facial tone is symmetric.    Strength: Side Biceps Triceps Deltoid Interossei Grip Wrist Ext. Wrist Flex.  R '5 5 5 5 5 5 5  '$ L '5 5 5 5 5 5 5   '$ Incision well healed   Imaging:  Cervical xrays dated 10/08/22:  ACDF C5-C6 with hardware in good position.   Radiology report not available for review.   Cervical spine x-rays from November 19, 2022 stable  Assessment / Plan: Luke Briggs. is doing well s/p above surgery.  He has continued to make improvements.  I think he will continue to slowly improve over time.  He is off activity limitations.  I recommended that he discontinue smoking.  I offered him referral to orthopedics for evaluation of his left thumb and hand, but he declined.  I will see him back in 6 months with x-rays.    Meade Maw MD Dept of Neurosurgery

## 2022-12-03 ENCOUNTER — Telehealth: Payer: Self-pay | Admitting: Nurse Practitioner

## 2022-12-03 DIAGNOSIS — Z599 Problem related to housing and economic circumstances, unspecified: Secondary | ICD-10-CM

## 2022-12-03 NOTE — Telephone Encounter (Signed)
Copied from [place] [phone]. Topic: General - Inquiry >> Dec 03, 2022 11:11 AM Marcellus Scott wrote: Reason for CRM: Pt stated that around this time of year, he contacts his healthcare team to help him get his propane tank refilled. He stated that he cannot afford this on his own and is unsure of who has helped him in the past.  Please advise.

## 2022-12-09 ENCOUNTER — Telehealth: Payer: Self-pay | Admitting: *Deleted

## 2022-12-09 NOTE — Patient Outreach (Signed)
  Care Coordination   Initial Visit Note   12/09/2022 Name: Luke Briggs. MRN: [id number] DOB: 1961/08/10  Luke Briggs. is a 62 y.o. year old male who sees [name], [name], NP for primary care. I spoke with  Luke Briggs. by phone today.  What matters to the patients health and wellness today?  Propane tank filled    Goals Addressed             This Visit's Progress    "I need help with getting my propane tank filled"       Care Coordination Interventions: Phone call from patient requesting assistance with getting his propane tank filled Phone call to the [employer] who state that they have funds, left message for follow up call SDOH screen completed, patient verbalized having no additional needs other than resources for propane tank           SDOH assessments and interventions completed:  Yes  SDOH Interventions Today    Flowsheet Row Most Recent Value  SDOH Interventions   Food Insecurity Interventions Intervention Not Indicated  Housing Interventions Intervention Not Indicated  Transportation Interventions Intervention Not Indicated        Care Coordination Interventions:  Yes, provided   Follow up plan: Follow up call scheduled for 12/16/22    Encounter Outcome:  Pt. Visit Completed

## 2022-12-09 NOTE — Patient Instructions (Signed)
Visit Information  Thank you for taking time to visit with me today. Please don't hesitate to contact me if I can be of assistance to you.   Following are the goals we discussed today:   Goals Addressed             This Visit's Progress    "I need help with getting my propane tank filled"       Care Coordination Interventions: Phone call from patient requesting assistance with getting his propane tank filled Phone call to the [employer] who state that they have funds, left message for follow up call SDOH screen completed, patient verbalized having no additional needs other than resources for propane tank           Our next appointment is by telephone on 12/16/22 at 11am  Please call the care guide team at [phone] if you need to cancel or reschedule your appointment.   If you are experiencing a Mental Health or [place] or need someone to talk to, please call 911   Patient verbalizes understanding of instructions and care plan provided today and agrees to view in [place]. Active MyChart status and patient understanding of how to access instructions and care plan via MyChart confirmed with patient.     Telephone follow up appointment with care management team member scheduled for: 12/16/22  Elliot Gurney, [place] Worker  [facility] Care Management [phone]

## 2022-12-12 ENCOUNTER — Telehealth: Payer: Self-pay | Admitting: *Deleted

## 2022-12-12 NOTE — Patient Outreach (Signed)
  Care Coordination   Follow Up Visit Note   12/12/2022 Name: Braedon Sjogren. MRN: [id number] DOB: [birth year]  [name]. is a 62 y.o. year old male who sees [name], [name], NP for primary care. I spoke with  [name]. by phone today.  What matters to the patients health and wellness today? Utility payments    Goals Addressed             This Visit's Progress    "I need help with getting my propane tank filled"       Care Coordination Interventions: Follow up phone call to patient regarding assistance with getting his propane tank filled Confirmed that the [employer] has funds to provide assistnace, patient informed that he will need to present to the [place].Azucena Kuba to pick up the application to complete between the hours of 9-12 and 1-4pm Monday-Friday Patient states that he will try to make it there on Monday to pick up the application. Patient's provider office to be notified of this plan Patient verbalized having no additional needs other than resources for propane tank and will plan to present to the [employer] by Monday to obtain the application           SDOH assessments and interventions completed:  No     Care Coordination Interventions:  Yes, provided   Follow up plan: Follow up call scheduled for      Encounter Outcome:  Pt. Visit Completed

## 2022-12-12 NOTE — Patient Instructions (Signed)
Visit Information  Thank you for taking time to visit with me today. Please don't hesitate to contact me if I can be of assistance to you.   Following are the goals we discussed today:   Goals Addressed             This Visit's Progress    "I need help with getting my propane tank filled"       Care Coordination Interventions: Follow up phone call to patient regarding assistance with getting his propane tank filled Confirmed that the [employer] has funds to provide assistnace, patient informed that he will need to present to the [place].Azucena Kuba to pick up the application to complete between the hours of 9-12 and 1-4pm Monday-Friday Patient states that he will try to make it there on Monday to pick up the application. Patient's provider office to be notified of this plan Patient verbalized having no additional needs other than resources for propane tank and will plan to present to the [employer] by Monday to obtain the application            Please call the care guide team at [phone] if you need to cancel or reschedule your appointment.   If you are experiencing a Mental Health or [place] or need someone to talk to, please call 911   Patient verbalizes understanding of instructions and care plan provided today and agrees to view in [place]. Active MyChart status and patient understanding of how to access instructions and care plan via MyChart confirmed with patient.     No further follow up required: patient agrees to present to the [employer] by [employer] ay to complete application to assist with [place], [place] Worker  [facility] Care Management [phone]

## 2022-12-13 ENCOUNTER — Telehealth: Payer: Self-pay | Admitting: *Deleted

## 2022-12-13 NOTE — Patient Instructions (Signed)
Visit Information  Thank you for taking time to visit with me today. Please don't hesitate to contact me if I can be of assistance to you.   Following are the goals we discussed today:   Goals Addressed             This Visit's Progress    "I need help with getting my propane tank filled"       Care Coordination Interventions: Follow up phone call to patient regarding assistance with getting his propane tank filled Verbal permission previously received to discussed propane needs with the gift of warmth program Phone call received from [place] of warmth program who confirmed that they have funds for propane, however need patient to complete application process Patient contacted and reinforced need to present to the [employer] [address] to pick up the application to complete between the hours of 9-12 and 1-4pm Monday-Friday Patient states that he will go on Monday to pick up the application, application to be processed  Patient verbalized having no additional needs other than resources for propane tank and will plan to present to the [employer] by Monday to obtain the application            Please call the care guide team at [phone] if you need to cancel or reschedule your appointment.   If you are experiencing a Mental Health or [place] or need someone to talk to, please call 911   Patient verbalizes understanding of instructions and care plan provided today and agrees to view in [place]. Active MyChart status and patient understanding of how to access instructions and care plan via MyChart confirmed with patient.     No further follow up required: patient to present to the [employer] to complete paperwork for assistance for [place], [place] Worker  [facility] Care Management [phone]

## 2022-12-13 NOTE — Patient Outreach (Signed)
  Care Coordination   Follow Up Visit Note   12/13/2022 Name: Luke Briggs. MRN: [id number] DOB: 03-Sep-1961  Luke Briggs. is a 62 y.o. year old male who sees [name], [name], NP for primary care. I spoke with  Luke Briggs. by phone today.  What matters to the patients health and wellness today?  Need for  propane   Goals Addressed             This Visit's Progress    "I need help with getting my propane tank filled"       Care Coordination Interventions: Follow up phone call to patient regarding assistance with getting his propane tank filled Verbal permission previously received to discussed propane needs with the gift of warmth program Phone call received from [place] of warmth program who confirmed that they have funds for propane, however need patient to complete application process Patient contacted and reinforced need to present to the [employer] [address] to pick up the application to complete between the hours of 9-12 and 1-4pm Monday-Friday Patient states that he will go on Monday to pick up the application, application to be processed  Patient verbalized having no additional needs other than resources for propane tank and will plan to present to the [employer] by Monday to obtain the application           SDOH assessments and interventions completed:  No     Care Coordination Interventions:  Yes, provided   Follow up plan: No further intervention required.   Encounter Outcome:  Pt. Visit Completed

## 2022-12-16 ENCOUNTER — Ambulatory Visit: Payer: Self-pay | Admitting: *Deleted

## 2022-12-16 NOTE — Patient Outreach (Signed)
  Care Coordination   Follow Up Visit Note   12/16/2022 Name: Luke Briggs. MRN: [id number] DOB: [birth year]  Luke Briggs. is a 62 y.o. year old male who sees [name], [name], NP for primary care. I spoke with  Luke Briggs. by phone today.  What matters to the patients health and wellness today?  propane    Goals Addressed             This Visit's Progress    "I need help with getting my propane tank filled"       Care Coordination Interventions: Follow up phone call to patient regarding assistance with getting his propane tank filled Verbal permission previously received to discussed propane needs with the gift of warmth program through [place] Return call from [facility] who requested update on the application process Patient confirmed that he did pick up the application and will have it completed and returned by Wednesday  Patient continues to verbalize having no additional needs other than resources for propane tank and will plan to present to the [employer] by Monday to obtain the application           SDOH assessments and interventions completed:  No     Care Coordination Interventions:  Yes, provided   Follow up plan: Follow up call scheduled for 12/18/22    Encounter Outcome:  Pt. Visit Completed

## 2022-12-16 NOTE — Patient Instructions (Signed)
Visit Information  Thank you for taking time to visit with me today. Please don't hesitate to contact me if I can be of assistance to you.   Following are the goals we discussed today:   Goals Addressed             This Visit's Progress    "I need help with getting my propane tank filled"       Care Coordination Interventions: Follow up phone call to patient regarding assistance with getting his propane tank filled Verbal permission previously received to discussed propane needs with the gift of warmth program through [place] Return call from [facility] who requested update on the application process Patient confirmed that he did pick up the application and will have it completed and returned by Wednesday  Patient continues to verbalize having no additional needs other than resources for propane tank and will plan to present to the [employer] by Monday to obtain the application           Our next appointment is by telephone on 12/18/22 at 9am  Please call the care guide team at [phone] if you need to cancel or reschedule your appointment.   If you are experiencing a Mental Health or [place] or need someone to talk to, please call 911   Patient verbalizes understanding of instructions and care plan provided today and agrees to view in [place]. Active MyChart status and patient understanding of how to access instructions and care plan via MyChart confirmed with patient.     Telephone follow up appointment with care management team member scheduled for:12/18/22  Elliot Gurney, [place] Worker  [facility] Care Management [phone]

## 2022-12-18 ENCOUNTER — Telehealth: Payer: Self-pay | Admitting: *Deleted

## 2022-12-18 ENCOUNTER — Encounter: Payer: Self-pay | Admitting: *Deleted

## 2022-12-18 NOTE — Patient Instructions (Signed)
Visit Information  Thank you for taking time to visit with me today. Please don't hesitate to contact me if I can be of assistance to you.   Following are the goals we discussed today:   Goals Addressed             This Visit's Progress    "I need help with getting my propane tank filled"       Care Coordination Interventions: Follow up phone call from patient regarding assistance with getting his propane tank filled Patient states that he has a friend that is willing to help him with the cost of the propane  Patient did obtain the paper work from [employer], however opted not to complete it based in information requested Patient continues to verbalize having no additional needs other and voiced appreciation for assistance provided.             Please call the care guide team at [phone] if you need to cancel or reschedule your appointment.   If you are experiencing a Mental Health or [place] or need someone to talk to, please call 911   Patient verbalizes understanding of instructions and care plan provided today and agrees to view in [place]. Active MyChart status and patient understanding of how to access instructions and care plan via MyChart confirmed with patient.     No further follow up required: patient to contact this Education officer, museum with any additional community resource needs  [employer], [place] Worker  [facility] Care Management [phone]

## 2022-12-18 NOTE — Patient Outreach (Signed)
  Care Coordination   Follow Up Visit Note   12/18/2022 Name: Luke Briggs. MRN: [id number] DOB: April 23, 1961  Luke Briggs. is a 62 y.o. year old male who sees [name], [name], NP for primary care. I spoke with  Luke Briggs. by phone today.  What matters to the patients health and wellness today? Patient verbalized having no additional community resource needs at this time     Goals Addressed             This Visit's Progress    "I need help with getting my propane tank filled"       Care Coordination Interventions: Follow up phone call from patient regarding assistance with getting his propane tank filled Patient states that he has a friend that is willing to help him with the cost of the propane  Patient did obtain the paper work from [employer], however opted not to complete it based in information requested Patient continues to verbalize having no additional needs other and voiced appreciation for assistance provided.           SDOH assessments and interventions completed:  No     Care Coordination Interventions:  Yes, provided   Follow up plan: No further intervention required.   Encounter Outcome:  Pt. Visit Completed

## 2022-12-19 ENCOUNTER — Encounter: Payer: Self-pay | Admitting: *Deleted

## 2022-12-20 ENCOUNTER — Telehealth: Payer: Self-pay | Admitting: Primary Care

## 2022-12-20 NOTE — Telephone Encounter (Signed)
Pt is aware of results and voiced his understanding. Nothing further needed.  

## 2022-12-20 NOTE — Telephone Encounter (Signed)
ONO 12/13/22 on room air>> patient spent 1 min 19 seconds with SpO2 <88%. SpO2 low 86% with baseline O2 95%. He does not need nocturnal oxygen  Thanks

## 2022-12-29 DIAGNOSIS — E559 Vitamin D deficiency, unspecified: Secondary | ICD-10-CM | POA: Insufficient documentation

## 2022-12-29 NOTE — Patient Instructions (Incomplete)
Eating Plan for Chronic Obstructive Pulmonary Disease Chronic obstructive pulmonary disease (COPD) causes symptoms such as shortness of breath, coughing, and chest discomfort. These symptoms can make it difficult to eat enough to maintain a healthy weight. Generally, people with COPD should eat a diet that is high in calories, protein, and other nutrients to maintain body weight and to keep the lungs as healthy as possible. Depending on the medicines you take and other health conditions you may have, your health care provider may give you additional recommendations on what to eat or avoid. Talk with your health care provider about your goals for body weight, and work with a dietitian to develop an eating plan that is right for you. What are tips for following this plan? Reading food labels  Avoid foods with more than 300 milligrams (mg) of salt (sodium) per serving. Choose foods that contain at least 4 grams (g) of fiber per serving. Try to eat 20-30 g of fiber each day. Choose foods that are high in calories and protein, such as nuts, beans, yogurt, and cheese. Shopping Do not buy foods labeled as diet, low-calorie, or low-fat. If you are able to eat dairy products: Avoid low-fat or skim milk. Buy dairy products that have at least 2% fat. Buy nutritional supplement drinks. Buy grains and prepared foods labeled as enriched or fortified. Consider buying low-sodium, pre-made foods to conserve energy for eating. Cooking Add dry milk or protein powder to smoothies. Cook with healthy fats, such as olive oil, canola oil, sunflower oil, and grapeseed oil. Add oil, butter, cream cheese, or nut butters to foods to increase fat and calories. To make foods easier to chew and swallow: Cook vegetables, pasta, and rice until soft. Cut or grind meat into very small pieces. Dip breads in liquid. Meal planning  Eat when you feel hungry. Eat 5-6 small meals throughout the day. Drink 6-8 glasses of water  each day. Do not drink liquids with meals. Drink liquids at the end of the meal to avoid feeling full too quickly. Eat a variety of fruits and vegetables every day. Ask for assistance from family or friends with planning and preparing meals as needed. Avoid foods that cause you to feel bloated, such as carbonated drinks, fried foods, beans, broccoli, cabbage, and apples. For older adults, ask your local agency on aging whether you are eligible for meal assistance programs, such as Meals on Wheels. Lifestyle  Do not smoke. Eat slowly. Take small bites and chew food well before swallowing. Do not overeat. This may make it more difficult to breathe after eating. Sit up while eating. If needed, continue to use supplemental oxygen while eating. Rest or relax for 30 minutes before and after eating. Monitor your weight as told by your health care provider. Exercise as told by your health care provider. What foods should I eat? Fruits All fresh, dried, canned, or frozen fruits that do not cause gas. Vegetables All fresh, canned (no salt added), or frozen vegetables that do not cause gas. Grains Whole-grain bread. Enriched whole-grain pasta. Fortified whole-grain cereals. Fortified rice. Quinoa. Meats and other proteins Lean meat. Poultry. Fish. Dried beans. Unsalted nuts. Tofu. Eggs. Nut butters. Dairy Whole or 2% milk. Cheese. Yogurt. Fats and oils Olive oil. Canola oil. Butter. Margarine. Beverages Water. Vegetable juice (no salt added). Decaffeinated coffee. Decaffeinated or herbal tea. Seasonings and condiments Fresh or dried herbs. Low-salt or salt-free seasonings. Low-sodium soy sauce. The items listed above may not be a complete list of foods  and beverages you can eat. Contact a dietitian for more information. What foods should I avoid? Fruits Fruits that cause gas, such as apples or melon. Vegetables Vegetables that cause gas, such as broccoli, Brussels sprouts, cabbage,  cauliflower, and onions. Canned vegetables with added salt. Meats and other proteins Fried meat. Salt-cured meat. Processed meat. Dairy Fat-free or low-fat milk, yogurt, or cheese. Processed cheese. Beverages Carbonated drinks. Caffeinated drinks, such as coffee, tea, and soft drinks. Juice. Alcohol. Vegetable juice with added salt. Seasonings and condiments Salt. Seasoning mixes with salt. Soy sauce. Angie Fava. Other foods Clear soup or broth. Fried foods. Prepared frozen meals. The items listed above may not be a complete list of foods and beverages you should avoid. Contact a dietitian for more information. Summary COPD symptoms can make it difficult to eat enough to maintain a healthy weight. A COPD eating plan can help you maintain your body weight and keep your lungs as healthy as possible. Eat a diet that is high in calories, protein, and other nutrients. Read labels to make sure that you are getting the right nutrients. Cook foods to make them easier to chew and swallow. Eat 5-6 small meals throughout the day, and avoid foods that cause gas or make you feel bloated. This information is not intended to replace advice given to you by your health care provider. Make sure you discuss any questions you have with your health care provider. Document Revised: 09/12/2020 Document Reviewed: 09/12/2020 Elsevier Patient Education  [place].

## 2023-01-03 ENCOUNTER — Ambulatory Visit: Payer: Medicaid Other | Admitting: Nurse Practitioner

## 2023-01-03 DIAGNOSIS — I700 Atherosclerosis of aorta: Secondary | ICD-10-CM

## 2023-01-03 DIAGNOSIS — G959 Disease of spinal cord, unspecified: Secondary | ICD-10-CM

## 2023-01-03 DIAGNOSIS — G894 Chronic pain syndrome: Secondary | ICD-10-CM

## 2023-01-03 DIAGNOSIS — F17208 Nicotine dependence, unspecified, with other nicotine-induced disorders: Secondary | ICD-10-CM

## 2023-01-03 DIAGNOSIS — I10 Essential (primary) hypertension: Secondary | ICD-10-CM

## 2023-01-03 DIAGNOSIS — F4312 Post-traumatic stress disorder, chronic: Secondary | ICD-10-CM

## 2023-01-03 DIAGNOSIS — F10288 Alcohol dependence with other alcohol-induced disorder: Secondary | ICD-10-CM

## 2023-01-03 DIAGNOSIS — R42 Dizziness and giddiness: Secondary | ICD-10-CM

## 2023-01-03 DIAGNOSIS — F209 Schizophrenia, unspecified: Secondary | ICD-10-CM

## 2023-01-03 DIAGNOSIS — J438 Other emphysema: Secondary | ICD-10-CM

## 2023-01-03 DIAGNOSIS — E559 Vitamin D deficiency, unspecified: Secondary | ICD-10-CM

## 2023-01-03 DIAGNOSIS — E782 Mixed hyperlipidemia: Secondary | ICD-10-CM

## 2023-01-05 NOTE — Patient Instructions (Signed)
COPD and Physical Activity ?Chronic obstructive pulmonary disease (COPD) is a long-term, or chronic, condition that affects the lungs. COPD is a general term that can be used to describe many problems that cause inflammation of the lungs and limit airflow. These conditions include chronic bronchitis and emphysema. ?The main symptom of COPD is shortness of breath, which makes it harder to do even simple tasks. This can also make it harder to exercise and stay active. Talk with your health care provider about treatments to help you breathe better and actions you can take to prevent breathing problems during physical activity. ?What are the benefits of exercising when you have COPD? ?Exercising regularly is an important part of a healthy lifestyle. You can still exercise and do physical activities even though you have COPD. Exercise and physical activity improve your shortness of breath by increasing blood flow (circulation). This causes your heart to pump more oxygen through your body. Moderate exercise can: ?Improve oxygen use. ?Increase your energy level. ?Help with shortness of breath. ?Strengthen your breathing muscles. ?Improve heart health. ?Help with sleep. ?Improve your self-esteem and feelings of self-worth. ?Lower depression, stress, and anxiety. ?Exercise can benefit everyone with COPD. The severity of your disease may affect how hard you can exercise, especially at first, but everyone can benefit. Talk with your health care provider about how much exercise is safe for you, and which activities and exercises are safe for you. ?What actions can I take to prevent breathing problems during physical activity? ?Sign up for a pulmonary rehabilitation program. This type of program may include: ?Education about lung diseases. ?Exercise classes that teach you how to exercise and be more active while improving your breathing. This usually involves: ?Exercise using your lower extremities, such as a stationary  bicycle. ?About 30 minutes of exercise, 2 to 5 times per week, for 6 to 12 weeks. ?Strength training, such as push-ups or leg lifts. ?Nutrition education. ?Group classes in which you can talk with others who also have COPD and learn ways to manage stress. ?If you use an oxygen tank, you should use it while you exercise. Work with your health care provider to adjust your oxygen for your physical activity. Your resting flow rate is different from your flow rate during physical activity. ?How to manage your breathing while exercising ?While you are exercising: ?Take slow breaths. ?Pace yourself, and do nottry to go too fast. ?Purse your lips while breathing out. Pursing your lips is similar to a kissing or whistling position. ?If doing exercise that uses a quick burst of effort, such as weight lifting: ?Breathe in before starting the exercise. ?Breathe out during the hardest part of the exercise, such as raising the weights. ?Where to find support ?You can find support for exercising with COPD from: ?Your health care provider. ?A pulmonary rehabilitation program. ?Your local health department or community health programs. ?Support groups, either online or in-person. Your health care provider may be able to recommend support groups. ?Where to find more information ?You can find more information about exercising with COPD from: ?American Lung Association: lung.org ?COPD Foundation: copdfoundation.org ?Contact a health care provider if: ?Your symptoms get worse. ?You have nausea. ?You have a fever. ?You want to start a new exercise program or a new activity. ?Get help right away if: ?You have chest pain. ?You cannot breathe. ?These symptoms may represent a serious problem that is an emergency. Do not wait to see if the symptoms will go away. Get medical help right away. Call   your local emergency services (911 in the [place] Do not drive yourself to the hospital. ?Summary ?COPD is a general term that can be used to describe  many different lung problems that cause lung inflammation and limit airflow. This includes chronic bronchitis and emphysema. ?Exercise and physical activity improve your shortness of breath by increasing blood flow (circulation). This causes your heart to provide more oxygen to your body. ?Contact your health care provider before starting any exercise program or new activity. Ask your health care provider what exercises and activities are safe for you. ?This information is not intended to replace advice given to you by your health care provider. Make sure you discuss any questions you have with your health care provider. ?Document Revised: 09/12/2020 Document Reviewed: 09/12/2020 ?Elsevier Patient Education ? 2023 Elsevier Inc. ? ?

## 2023-01-10 ENCOUNTER — Encounter: Payer: Self-pay | Admitting: Nurse Practitioner

## 2023-01-10 ENCOUNTER — Ambulatory Visit: Payer: Medicaid Other | Admitting: Nurse Practitioner

## 2023-01-10 VITALS — BP 148/90 | HR 80 | Temp 97.8°F | Ht 70.0 in | Wt 192.7 lb

## 2023-01-10 DIAGNOSIS — Z8673 Personal history of transient ischemic attack (TIA), and cerebral infarction without residual deficits: Secondary | ICD-10-CM

## 2023-01-10 DIAGNOSIS — F10288 Alcohol dependence with other alcohol-induced disorder: Secondary | ICD-10-CM | POA: Diagnosis not present

## 2023-01-10 DIAGNOSIS — E782 Mixed hyperlipidemia: Secondary | ICD-10-CM

## 2023-01-10 DIAGNOSIS — F17210 Nicotine dependence, cigarettes, uncomplicated: Secondary | ICD-10-CM

## 2023-01-10 DIAGNOSIS — G959 Disease of spinal cord, unspecified: Secondary | ICD-10-CM | POA: Diagnosis not present

## 2023-01-10 DIAGNOSIS — E559 Vitamin D deficiency, unspecified: Secondary | ICD-10-CM

## 2023-01-10 DIAGNOSIS — R42 Dizziness and giddiness: Secondary | ICD-10-CM

## 2023-01-10 DIAGNOSIS — F1111 Opioid abuse, in remission: Secondary | ICD-10-CM

## 2023-01-10 DIAGNOSIS — J438 Other emphysema: Secondary | ICD-10-CM | POA: Diagnosis not present

## 2023-01-10 DIAGNOSIS — I10 Essential (primary) hypertension: Secondary | ICD-10-CM

## 2023-01-10 DIAGNOSIS — F209 Schizophrenia, unspecified: Secondary | ICD-10-CM | POA: Diagnosis not present

## 2023-01-10 DIAGNOSIS — I700 Atherosclerosis of aorta: Secondary | ICD-10-CM

## 2023-01-10 DIAGNOSIS — G894 Chronic pain syndrome: Secondary | ICD-10-CM

## 2023-01-10 DIAGNOSIS — I6523 Occlusion and stenosis of bilateral carotid arteries: Secondary | ICD-10-CM

## 2023-01-10 DIAGNOSIS — R748 Abnormal levels of other serum enzymes: Secondary | ICD-10-CM

## 2023-01-10 DIAGNOSIS — Z9889 Other specified postprocedural states: Secondary | ICD-10-CM

## 2023-01-10 DIAGNOSIS — H9313 Tinnitus, bilateral: Secondary | ICD-10-CM

## 2023-01-10 DIAGNOSIS — F4312 Post-traumatic stress disorder, chronic: Secondary | ICD-10-CM

## 2023-01-10 MED ORDER — OLMESARTAN MEDOXOMIL-HCTZ 40-25 MG PO TABS: 1.0000 | ORAL_TABLET | Freq: Every day | ORAL | 4 refills | Status: DC

## 2023-01-10 NOTE — Assessment & Plan Note (Signed)
Refer to schizophrenia plan of care.

## 2023-01-10 NOTE — Assessment & Plan Note (Signed)
Recheck CMP, CBC, GTT today -- may pursue ultrasound to further assess area, recent CT was negative for acute findings.

## 2023-01-10 NOTE — Assessment & Plan Note (Signed)
Chronic, ongoing. Continue current medication regimen and adjust as needed.  Lipid panel today. 

## 2023-01-10 NOTE — Assessment & Plan Note (Signed)
Continue collaboration with vascular on yearly basis, did not attend scheduled 02/01/22 visit. New referral today to get him back on track.  Last doppler 07/29/22.

## 2023-01-10 NOTE — Assessment & Plan Note (Signed)
Noted on past labs, check level today and recommend he start Vitamin D3 2000 units daily.

## 2023-01-10 NOTE — Assessment & Plan Note (Signed)
Chronic, noted on CT lung screening.  Continue statin therapy + recommend complete cessation of smoking.  Lipid panel today.

## 2023-01-10 NOTE — Assessment & Plan Note (Signed)
Chronic, with BP elevated today.  Will go to increased dose per Dr. Smith Mince of Benicar 40-25, this had levels stable in past.  Continue to recommend reduction to cessation of alcohol use, however he continues to drink heavily.  Recommend cutting back on alcohol and smoking -- work towards complete cessation.  Recommend he monitor BP at least a few mornings a week at home and document.  DASH diet at home.  Labs today: CBC and CMP.

## 2023-01-10 NOTE — Assessment & Plan Note (Signed)
Ongoing issue post CVA -- will get him back into neurology - referral placed and recommend he attend.  Suspect some related to heavier alcohol use, recommend he cut back and work towards cessation.  However, he reports this helps vertigo.

## 2023-01-10 NOTE — Assessment & Plan Note (Signed)
Chronic, ongoing.  Continue to collaborate with pulmonary, recommend he continue to attend scheduled visits with them.  Continue Breztri BID and Albuterol as needed at this time.  Continue yearly lung screening.  Recommend complete cessation of smoking.

## 2023-01-10 NOTE — Assessment & Plan Note (Signed)
Chronic, ongoing.  No current medications.  Tolerating this and does not wish to start medication.  Could consider Seroquel in future if needed or return to Clonidine.  Denies SI/HI.

## 2023-01-10 NOTE — Assessment & Plan Note (Signed)
Has quit taking opioids, at this time continues to remain off these per report -- monitor closely.  He reports Medicaid sent him a letter stating PCP can only give him pain medication, have recommended he bring this letter in as PCP does not perform chronic pain management and would have to discuss with Medicaid if this is the case.

## 2023-01-10 NOTE — Assessment & Plan Note (Signed)
Recommend cutting back -- concern for overall health with ongoing heavy use and suspect major reason for his overall feeling bad.  Is aware of effect of heavy alcohol use on overall health, especially BP.   Check labs today to include CMP, CBC.

## 2023-01-10 NOTE — Assessment & Plan Note (Signed)
History of infarct, continue collaboration with neuro and ENT for ongoing tinnitus, headaches, and dizziness.  Recent MRI remains stable.  Referral to neurology, as would benefit follow-up with them.

## 2023-01-10 NOTE — Assessment & Plan Note (Signed)
I have recommended complete cessation of tobacco use. I have discussed various options available for assistance with tobacco cessation including over the counter methods (Nicotine gum, patch and lozenges). We also discussed prescription options (Chantix, Nicotine Inhaler / Nasal Spray). The patient is not interested in pursuing any prescription tobacco cessation options at this time.  Continue yearly lung CA screening.

## 2023-01-10 NOTE — Assessment & Plan Note (Signed)
Ongoing.  Recent surgery.  Followed by neurosurgery.  Appreciate their input. ?dizziness and numbness related to this.

## 2023-01-10 NOTE — Assessment & Plan Note (Signed)
Ongoing issue since CVA with episodes daily.  Suspect more related to past CVA and heavy drinking, although he reports drinking helps the ringing.  He refuses to return to ENT at this time.

## 2023-01-10 NOTE — Progress Notes (Signed)
BP (!) 148/90 (BP Location: Left Arm, Patient Position: Sitting, Cuff Size: Normal)   Pulse 80   Temp 97.8 F (36.6 C) (Oral)   Ht '5\' 10"'$  (1.778 m)   Wt 192 lb 11.2 oz (87.4 kg)   SpO2 94%   BMI 27.65 kg/m    Subjective:    Patient ID: Luke Briggs., male    DOB: 30-Jul-1961, 63 y.o.   MRN: [id number]  HPI: Luke Briggs. is a 62 y.o. male  Chief Complaint  Patient presents with   Hypertension   Hyperlipidemia   COPD   Mood   Alcohol use   COPD Uses Breztri and Albuterol as needed.  Currently followed with pulmonary and saw last 09/23/22 -- recent exacerbation, pulmonary sent abx and Prednisone but he did not take as abx causes diarrhea.  Taking Montelukast.  Had last lung screening on 10/23/21 -- noting emphysema and aortic atherosclerosis.    Continues to smoke 1 PPD, has been smoking for >45 years.  Not interested in quitting.   Satisfied with current treatment?: yes Oxygen use: no Dyspnea frequency: minimal Cough frequency: none Rescue inhaler frequency:  2-3 times a day Limitation of activity: no Productive cough: none Last Spirometry: with pulmonary Pneumovax: Not up to Date Influenza: Up to Date  HYPERTENSION / HYPERLIPIDEMIA Continues on Benicar, needs the 40-25 dosing sent in, currently has 20-12.5 MG.  Last saw cardiology 02/12/22.  Drinking alcohol nightly -- 6-12 [employer] as day -- has tried cutting back. Continues to report drinking helps his tinnitus and reports he feels worse if he does not drink.  Reports about 4 beers and his ears stop ringing + feels more energy. Satisfied with current treatment? yes Duration of hypertension: chronic BP monitoring frequency: not checking BP range:  BP medication side effects: no Duration of hyperlipidemia: chronic Cholesterol medication side effects: no Cholesterol supplements: none Medication compliance: poor compliance Aspirin: no Recent stressors: no Recurrent headaches: no Visual changes:  no Palpitations: no Dyspnea: no Chest pain: no Lower extremity edema: no Dizzy/lightheaded: yes   COPD Last visit to pulmonary on 11/13/22. Continues to smoke 1 PPD, has smoked since age 9.  Last lung CA screening 10/23/22 with emphysema and aortic atherosclerosis. COPD status: stable Satisfied with current treatment?: yes Oxygen use: no Dyspnea frequency: none Cough frequency: none Rescue inhaler frequency: none   Limitation of activity: no Productive cough: none Last Spirometry: with pulmonary Pneumovax: Up to Date Influenza: Up to Date   FATIGUE/DIZZINESS/NUMBNESS Ongoing, chronic issues with fatigue/numbness, tinnitus, and dizziness -- has had lengthy work-up.  Has a known chronic right basal ganglia lacunar infarct on imaging and continues to complain of ongoing tinnitus, fatigue, numbness, and intermittent dizziness -- sees ENT & neuro for this as needed. He reports ongoing issues, has not been back to see neurology as instructed or ENT.  Last MRI brain on 07/29/22 showed no acute changes and small chronic lacunar infarct.  Vascular visit last 02/01/21 with ultrasound showing widely patent RICA and [id number] LICA stenosis -- to return for imaging on 02/17/23.  Does follow neurosurgery for cervical myelopathy and had C5-C6 anterior cervical discectomy on 08/21/22, followed up with them on 11/19/22.  He reports he got a message from [facility] stating he can only get pain management from PCP. Duration:  chronic Severity: 9/10  Onset: gradual Context when symptoms started:  unknown Symptoms improve with rest: no  Depressive symptoms: yes Stress/anxiety: no Insomnia: yes hard to fall asleep Snoring: no  Observed apnea by bed partner: no Daytime hypersomnolence:yes Wakes feeling refreshed: no History of sleep study: no Dysnea on exertion:  yes - seeing pulmonary Orthopnea/PND: no Chest pain: no Chronic cough: no Lower extremity edema: no Arthralgias:no Myalgias: no Weakness: yes Rash:  no   SCHIZOPHRENIA No current medications.  Mood status: stable Satisfied with current treatment?: yes Symptom severity:mild Psychotherapy/counseling: in past Previous psychiatric medications: multiple in past Depressed mood: no Anxious mood: no Anhedonia: no Significant weight loss or gain: no Insomnia: yes hard to fall asleep Fatigue: no Feelings of worthlessness or guilt: no Impaired concentration/indecisiveness: no Suicidal ideations: no Hopelessness: no Crying spells: no    01/10/2023    9:47 AM 10/03/2022    8:55 AM 07/18/2022    8:32 AM 05/28/2022    9:31 AM 02/01/2022    9:33 AM  Depression screen PHQ 2/9  Decreased Interest 0 '2 2 3 2  '$ Down, Depressed, Hopeless 0 '3 3 3 2  '$ PHQ - 2 Score 0 '5 5 6 4  '$ Altered sleeping 0 '3 3 3 2  '$ Tired, decreased energy 0 '3 3 3 2  '$ Change in appetite 0 '2 2 1 2  '$ Feeling bad or failure about yourself  0 '3 2 1 2  '$ Trouble concentrating 0 '3 3 3 2  '$ Moving slowly or fidgety/restless 0 '2 3 2 2  '$ Suicidal thoughts 0 0 0 0 0  PHQ-9 Score 0 '21 21 19 16  '$ Difficult doing work/chores Not difficult at all Extremely dIfficult Extremely dIfficult Very difficult        01/10/2023    9:47 AM 10/03/2022    8:56 AM 07/18/2022    8:32 AM 05/28/2022    9:31 AM  GAD 7 : Generalized Anxiety Score  Nervous, Anxious, on Edge 0 '2 2 2  '$ Control/stop worrying 0 '3 3 2  '$ Worry too much - different things 0 '3 3 2  '$ Trouble relaxing 0 '3 3 3  '$ Restless 0 '2 1 2  '$ Easily annoyed or irritable 0 '3 3 3  '$ Afraid - awful might happen 0 '3 3 1  '$ Total GAD 7 Score 0 '19 18 15  '$ Anxiety Difficulty Not difficult at all Extremely difficult Extremely difficult Somewhat difficult       Relevant past medical, surgical, family and social history reviewed and updated as indicated. Interim medical history since our last visit reviewed. Allergies and medications reviewed and updated.  Review of Systems  Constitutional:  Positive for fatigue. Negative for activity change, chills,  diaphoresis and fever.  Respiratory:  Negative for cough, chest tightness, shortness of breath and wheezing.   Cardiovascular:  Negative for chest pain, palpitations and leg swelling.  Gastrointestinal: Negative.   Endocrine: Negative.   Neurological:  Positive for dizziness and numbness. Negative for syncope, weakness, light-headedness and headaches.  Psychiatric/Behavioral: Negative.     Per HPI unless specifically indicated above     Objective:    BP (!) 148/90 (BP Location: Left Arm, Patient Position: Sitting, Cuff Size: Normal)   Pulse 80   Temp 97.8 F (36.6 C) (Oral)   Ht '5\' 10"'$  (1.778 m)   Wt 192 lb 11.2 oz (87.4 kg)   SpO2 94%   BMI 27.65 kg/m   Wt Readings from Last 3 Encounters:  01/10/23 192 lb 11.2 oz (87.4 kg)  11/19/22 192 lb 12.8 oz (87.5 kg)  11/13/22 191 lb 3.2 oz (86.7 kg)    Physical Exam Vitals and nursing note reviewed.  Constitutional:  General: He is awake. He is not in acute distress.    Appearance: He is well-developed and well-groomed. He is not ill-appearing or toxic-appearing.  HENT:     Head: Normocephalic and atraumatic.     Right Ear: Hearing normal. No drainage.     Left Ear: Hearing normal. No drainage.  Eyes:     General: Lids are normal.        Right eye: No discharge.        Left eye: No discharge.     Conjunctiva/sclera: Conjunctivae normal.     Pupils: Pupils are equal, round, and reactive to light.     Visual Fields: Right eye visual fields normal and left eye visual fields normal.  Neck:     Thyroid: No thyromegaly.     Vascular: No carotid bruit.  Cardiovascular:     Rate and Rhythm: Normal rate and regular rhythm.     Heart sounds: Normal heart sounds, S1 normal and S2 normal. No murmur heard.    No gallop.  Pulmonary:     Effort: Pulmonary effort is normal. No accessory muscle usage or respiratory distress.     Breath sounds: Wheezing present. No decreased breath sounds or rhonchi.     Comments: Scattered expiratory  wheezes throughout per baseline. Abdominal:     General: Bowel sounds are normal. There is no distension.     Palpations: Abdomen is soft. There is no hepatomegaly.     Tenderness: There is no abdominal tenderness.     Hernia: No hernia is present.  Musculoskeletal:        General: Normal range of motion.     Cervical back: Normal range of motion and neck supple.     Right lower leg: No edema.     Left lower leg: No edema.  Skin:    General: Skin is warm and dry.     Capillary Refill: Capillary refill takes less than 2 seconds.     Findings: No rash.  Neurological:     Mental Status: He is alert and oriented to person, place, and time.     Cranial Nerves: Cranial nerves 2-12 are intact.     Sensory: Sensation is intact.     Motor: Motor function is intact.     Coordination: Coordination is intact.     Gait: Gait is intact.     Deep Tendon Reflexes: Reflexes are normal and symmetric.     Reflex Scores:      Brachioradialis reflexes are 2+ on the right side and 2+ on the left side.      Patellar reflexes are 2+ on the right side and 2+ on the left side. Psychiatric:        Attention and Perception: Attention normal.        Mood and Affect: Mood normal.        Speech: Speech normal.        Behavior: Behavior normal. Behavior is cooperative.        Thought Content: Thought content normal.    Results for orders placed or performed in visit on 10/03/22  CBC with Differential/Platelet  Result Value Ref Range   WBC 9.3 3.4 - 10.8 x10E3/uL   RBC 4.83 4.14 - 5.80 x10E6/uL   Hemoglobin 15.0 13.0 - 17.7 g/dL   Hematocrit 43.4 37.5 - 51.0 %   MCV 90 79 - 97 fL   MCH 31.1 26.6 - 33.0 pg   MCHC 34.6 31.5 - 35.7 g/dL  RDW 12.2 11.6 - 15.4 %   Platelets 366 150 - 450 x10E3/uL   Neutrophils 71 Not Estab. %   Lymphs 18 Not Estab. %   Monocytes 10 Not Estab. %   Eos 1 Not Estab. %   Basos 0 Not Estab. %   Neutrophils Absolute 6.5 1.4 - 7.0 x10E3/uL   Lymphocytes Absolute 1.7 0.7 -  3.1 x10E3/uL   Monocytes Absolute 1.0 (H) 0.1 - 0.9 x10E3/uL   EOS (ABSOLUTE) 0.1 0.0 - 0.4 x10E3/uL   Basophils Absolute 0.0 0.0 - 0.2 x10E3/uL   Immature Granulocytes 0 Not Estab. %   Immature Grans (Abs) 0.0 0.0 - 0.1 x10E3/uL  VITAMIN D 25 Hydroxy (Vit-D Deficiency, Fractures)  Result Value Ref Range   Vit D, 25-Hydroxy 28.7 (L) 30.0 - 100.0 ng/mL  Comprehensive metabolic panel  Result Value Ref Range   Glucose 87 70 - 99 mg/dL   BUN 9 6 - 24 mg/dL   Creatinine, Ser 1.17 0.76 - 1.27 mg/dL   eGFR 72 >59 mL/min/1.73   BUN/Creatinine Ratio 8 (L) 9 - 20   Sodium 133 (L) 134 - 144 mmol/L   Potassium 3.5 3.5 - 5.2 mmol/L   Chloride 91 (L) 96 - 106 mmol/L   CO2 29 20 - 29 mmol/L   Calcium 9.2 8.7 - 10.2 mg/dL   Total Protein 6.9 6.0 - 8.5 g/dL   Albumin 4.2 3.8 - 4.9 g/dL   Globulin, Total 2.7 1.5 - 4.5 g/dL   Albumin/Globulin Ratio 1.6 1.2 - 2.2   Bilirubin Total 0.8 0.0 - 1.2 mg/dL   Alkaline Phosphatase 101 44 - 121 IU/L   AST 36 0 - 40 IU/L   ALT 36 0 - 44 IU/L      Assessment & Plan:   Problem List Items Addressed This Visit       Cardiovascular and Mediastinum   Aortic atherosclerosis (HCC)    Chronic, noted on CT lung screening.  Continue statin therapy + recommend complete cessation of smoking.  Lipid panel today.      Relevant Medications   olmesartan-hydrochlorothiazide (BENICAR HCT) 40-25 MG tablet   Other Relevant Orders   Comprehensive metabolic panel   Lipid Panel w/o Chol/HDL Ratio   Carotid stenosis    Continue collaboration with vascular on yearly basis, did not attend scheduled 02/01/22 visit. New referral today to get him back on track.  Last doppler 07/29/22.      Relevant Medications   olmesartan-hydrochlorothiazide (BENICAR HCT) 40-25 MG tablet   Essential hypertension    Chronic, with BP elevated today.  Will go to increased dose per Dr. Smith Mince of Benicar 40-25, this had levels stable in past.  Continue to recommend reduction to cessation of  alcohol use, however he continues to drink heavily.  Recommend cutting back on alcohol and smoking -- work towards complete cessation.  Recommend he monitor BP at least a few mornings a week at home and document.  DASH diet at home.  Labs today: CBC and CMP.       Relevant Medications   olmesartan-hydrochlorothiazide (BENICAR HCT) 40-25 MG tablet   Other Relevant Orders   Comprehensive metabolic panel     Respiratory   Paraseptal emphysema (HCC) - Primary    Chronic, ongoing.  Continue to collaborate with pulmonary, recommend he continue to attend scheduled visits with them.  Continue Breztri BID and Albuterol as needed at this time.  Continue yearly lung screening.  Recommend complete cessation of smoking.  Relevant Orders   CBC with Differential/Platelet     Nervous and Auditory   Cervical myelopathy (HCC)    Ongoing.  Recent surgery.  Followed by neurosurgery.  Appreciate their input. ?dizziness and numbness related to this.        Other   Alcohol dependence ([place])    Recommend cutting back -- concern for overall health with ongoing heavy use and suspect major reason for his overall feeling bad.  Is aware of effect of heavy alcohol use on overall health, especially BP.   Check labs today to include CMP, CBC.      Relevant Orders   Comprehensive metabolic panel   Chronic post-traumatic stress disorder (PTSD)    Refer to schizophrenia plan of care.      Chronic vertigo    Ongoing issue post CVA -- will get him back into neurology - referral placed and recommend he attend.  Suspect some related to heavier alcohol use, recommend he cut back and work towards cessation.  However, he reports this helps vertigo.      Relevant Orders   Ambulatory referral to Neurology   Elevated serum GGT level    Recheck CMP, CBC, GTT today -- may pursue ultrasound to further assess area, recent CT was negative for acute findings.      Relevant Orders   Gamma GT   History of lacunar  cerebrovascular accident    History of infarct, continue collaboration with neuro and ENT for ongoing tinnitus, headaches, and dizziness.  Recent MRI remains stable.  Referral to neurology, as would benefit follow-up with them.      Relevant Orders   Ambulatory referral to Neurology   Ambulatory referral to Vascular Surgery   History of opioid abuse ([place])    Has quit taking opioids, at this time continues to remain off these per report -- monitor closely.  He reports Medicaid sent him a letter stating PCP can only give him pain medication, have recommended he bring this letter in as PCP does not perform chronic pain management and would have to discuss with Medicaid if this is the case.      Mixed hyperlipidemia    Chronic, ongoing.  Continue current medication regimen and adjust as needed.  Lipid panel today.       Relevant Medications   olmesartan-hydrochlorothiazide (BENICAR HCT) 40-25 MG tablet   Other Relevant Orders   Comprehensive metabolic panel   Lipid Panel w/o Chol/HDL Ratio   Nicotine dependence    I have recommended complete cessation of tobacco use. I have discussed various options available for assistance with tobacco cessation including over the counter methods (Nicotine gum, patch and lozenges). We also discussed prescription options (Chantix, Nicotine Inhaler / Nasal Spray). The patient is not interested in pursuing any prescription tobacco cessation options at this time.  Continue yearly lung CA screening.       Schizophrenia ([place])    Chronic, ongoing.  No current medications.  Tolerating this and does not wish to start medication.  Could consider Seroquel in future if needed or return to Clonidine.  Denies SI/HI.      Tinnitus of both ears    Ongoing issue since CVA with episodes daily.  Suspect more related to past CVA and heavy drinking, although he reports drinking helps the ringing.  He refuses to return to ENT at this time.      Vitamin D deficiency     Noted on past labs, check level today and recommend he start  Vitamin D3 2000 units daily.      Relevant Orders   VITAMIN D 25 Hydroxy (Vit-D Deficiency, Fractures)     Follow up plan: Return in about 3 months (around 04/10/2023) for COPD, MOOD, HTN/HLD, DIZZINESS, ALCOHOL USE.

## 2023-01-11 ENCOUNTER — Other Ambulatory Visit: Payer: Self-pay | Admitting: Nurse Practitioner

## 2023-01-11 DIAGNOSIS — R7989 Other specified abnormal findings of blood chemistry: Secondary | ICD-10-CM

## 2023-01-11 LAB — CBC WITH DIFFERENTIAL/PLATELET
Basophils Absolute: 0 10*3/uL (ref 0.0–0.2)
Basos: 0 %
EOS (ABSOLUTE): 0 10*3/uL (ref 0.0–0.4)
Eos: 0 %
Hematocrit: 44.9 % (ref 37.5–51.0)
Hemoglobin: 15 g/dL (ref 13.0–17.7)
Immature Grans (Abs): 0 10*3/uL (ref 0.0–0.1)
Immature Granulocytes: 0 %
Lymphocytes Absolute: 2.6 10*3/uL (ref 0.7–3.1)
Lymphs: 23 %
MCH: 30.5 pg (ref 26.6–33.0)
MCHC: 33.4 g/dL (ref 31.5–35.7)
MCV: 91 fL (ref 79–97)
Monocytes Absolute: 0.8 10*3/uL (ref 0.1–0.9)
Monocytes: 8 %
Neutrophils Absolute: 7.7 10*3/uL — ABNORMAL HIGH (ref 1.4–7.0)
Neutrophils: 69 %
Platelets: 380 10*3/uL (ref 150–450)
RBC: 4.92 x10E6/uL (ref 4.14–5.80)
RDW: 12.3 % (ref 11.6–15.4)
WBC: 11.2 10*3/uL — ABNORMAL HIGH (ref 3.4–10.8)

## 2023-01-11 LAB — COMPREHENSIVE METABOLIC PANEL
ALT: 18 IU/L (ref 0–44)
AST: 19 IU/L (ref 0–40)
Albumin/Globulin Ratio: 1.4 (ref 1.2–2.2)
Albumin: 4.2 g/dL (ref 3.8–4.9)
Alkaline Phosphatase: 84 IU/L (ref 44–121)
BUN/Creatinine Ratio: 17 (ref 9–20)
BUN: 15 mg/dL (ref 6–24)
Bilirubin Total: 0.5 mg/dL (ref 0.0–1.2)
CO2: 21 mmol/L (ref 20–29)
Calcium: 9.4 mg/dL (ref 8.7–10.2)
Chloride: 93 mmol/L — ABNORMAL LOW (ref 96–106)
Creatinine, Ser: 0.89 mg/dL (ref 0.76–1.27)
Globulin, Total: 3 g/dL (ref 1.5–4.5)
Glucose: 68 mg/dL — ABNORMAL LOW (ref 70–99)
Potassium: 3.7 mmol/L (ref 3.5–5.2)
Sodium: 134 mmol/L (ref 134–144)
Total Protein: 7.2 g/dL (ref 6.0–8.5)
eGFR: 99 mL/min/{1.73_m2} (ref >59)

## 2023-01-11 LAB — LIPID PANEL W/O CHOL/HDL RATIO
Cholesterol, Total: 167 mg/dL (ref 100–199)
HDL: 74 mg/dL (ref >39)
LDL Chol Calc (NIH): 78 mg/dL (ref 0–99)
Triglycerides: 80 mg/dL (ref 0–149)
VLDL Cholesterol Cal: 15 mg/dL (ref 5–40)

## 2023-01-11 LAB — VITAMIN D 25 HYDROXY (VIT D DEFICIENCY, FRACTURES): Vit D, 25-Hydroxy: 38.4 ng/mL (ref 30.0–100.0)

## 2023-01-11 LAB — GAMMA GT: GGT: 58 IU/L (ref 0–65)

## 2023-01-11 NOTE — Progress Notes (Signed)
Contacted via [place] - lab visit only in 4 weeks please   Good morning Luke Briggs, your labs have returned: - CBC shows mild elevation in white blood cell count and neutrophils, have you been sick recently? Please let me know. We need to recheck this in 4 weeks via outpatient labs, my staff will call to schedule. - Kidney function, creatinine and eGFR, remains normal, as is liver function, AST and ALT. Sugar (glucose) is a little low, please ensure you are getting 3 meals a day and healthy snacks in between. - Remainder of labs all stable.  Any questions? Keep being amazing!!  Thank you for allowing me to participate in your care.  I appreciate you. Kindest regards, Arlanda Shiplett

## 2023-01-13 NOTE — Progress Notes (Signed)
Called pt and schedule lab appointment for Apr 11, 2023 @ 8:20 am.

## 2023-02-07 ENCOUNTER — Other Ambulatory Visit: Payer: Medicaid Other

## 2023-02-07 DIAGNOSIS — R7989 Other specified abnormal findings of blood chemistry: Secondary | ICD-10-CM

## 2023-02-08 LAB — CBC WITH DIFFERENTIAL/PLATELET
Basophils Absolute: 0 10*3/uL (ref 0.0–0.2)
Basos: 0 %
EOS (ABSOLUTE): 0.1 10*3/uL (ref 0.0–0.4)
Eos: 1 %
Hematocrit: 46.1 % (ref 37.5–51.0)
Hemoglobin: 15.4 g/dL (ref 13.0–17.7)
Immature Grans (Abs): 0 10*3/uL (ref 0.0–0.1)
Immature Granulocytes: 0 %
Lymphocytes Absolute: 2 10*3/uL (ref 0.7–3.1)
Lymphs: 22 %
MCH: 30.3 pg (ref 26.6–33.0)
MCHC: 33.4 g/dL (ref 31.5–35.7)
MCV: 91 fL (ref 79–97)
Monocytes Absolute: 0.8 10*3/uL (ref 0.1–0.9)
Monocytes: 9 %
Neutrophils Absolute: 6 10*3/uL (ref 1.4–7.0)
Neutrophils: 68 %
Platelets: 336 10*3/uL (ref 150–450)
RBC: 5.08 x10E6/uL (ref 4.14–5.80)
RDW: 12.3 % (ref 11.6–15.4)
WBC: 9 10*3/uL (ref 3.4–10.8)

## 2023-02-08 LAB — AMYLASE: Amylase: 80 U/L (ref 31–110)

## 2023-02-08 LAB — LIPASE: Lipase: 33 U/L (ref 13–78)

## 2023-02-08 LAB — URIC ACID: Uric Acid: 4.8 mg/dL (ref 3.8–8.4)

## 2023-02-08 NOTE — Progress Notes (Signed)
Contacted via [place] afternoon Luke Briggs, your labs have returned and everything looks nice and normal.  No changes needed.  Great news!!

## 2023-02-17 ENCOUNTER — Encounter (INDEPENDENT_AMBULATORY_CARE_PROVIDER_SITE_OTHER): Payer: Medicaid Other

## 2023-02-17 ENCOUNTER — Ambulatory Visit (INDEPENDENT_AMBULATORY_CARE_PROVIDER_SITE_OTHER): Payer: Medicaid Other | Admitting: Vascular Surgery

## 2023-02-18 ENCOUNTER — Telehealth: Payer: Self-pay | Admitting: Nurse Practitioner

## 2023-02-18 MED ORDER — TRIAMCINOLONE ACETONIDE 0.1 % EX CREA: 1.0000 | TOPICAL_CREAM | Freq: Two times a day (BID) | CUTANEOUS | 2 refills | Status: DC

## 2023-02-18 NOTE — Telephone Encounter (Signed)
Medication Refill - Medication: triamcinolone cream (KENALOG) 0.1 %   Has the patient contacted their pharmacy? No. (Agent: If no, request that the patient contact the pharmacy for the refill. If patient does not wish to contact the pharmacy document the reason why and proceed with request.) (Agent: If yes, when and what did the pharmacy advise?)  Preferred Pharmacy (with phone number or street name):  [place], [place] Phone: [phone]  Fax: [phone]     Has the patient been seen for an appointment in the last year OR does the patient have an upcoming appointment? Yes.    Agent: Please be advised that RX refills may take up to 3 business days. We ask that you follow-up with your pharmacy.

## 2023-02-26 ENCOUNTER — Other Ambulatory Visit (INDEPENDENT_AMBULATORY_CARE_PROVIDER_SITE_OTHER): Payer: Self-pay | Admitting: Vascular Surgery

## 2023-02-26 DIAGNOSIS — I6523 Occlusion and stenosis of bilateral carotid arteries: Secondary | ICD-10-CM

## 2023-03-09 DIAGNOSIS — I739 Peripheral vascular disease, unspecified: Secondary | ICD-10-CM | POA: Insufficient documentation

## 2023-03-09 NOTE — Progress Notes (Unsigned)
MRN : [id number]  Luke Briggs. is a 62 y.o. (04/14/1961) male who presents with chief complaint of check carotid arteries.  History of Present Illness:   The patient is seen for follow up evaluation of carotid stenosis status post CT angiogram. CT scan was done 01/25/2021.  Patient reports that the test went well with no problems or complications.    The patient denies interval amaurosis fugax. There is no recent or interval TIA symptoms or focal motor deficits. There is no prior documented CVA.   The patient is taking enteric-coated aspirin 81 mg daily.   There is no history of migraine headaches. There is no history of seizures.   The patient has a history of coronary artery disease, no recent episodes of angina or shortness of breath. The patient denies PAD or claudication symptoms. There is a history of hyperlipidemia which is being treated with a statin.     Previous CT angiogram is reviewed by me personally and shows widely patent intracranial arteries. Previous MRA of the neck shows widely patent RICA and a 50% LICA  Duplex ultrasound of the bilateral carotid arteries today shows RICA 1-39% and 40-59% LICA. Left vertebral is antegrade and the right is chronically occluded.  No outpatient medications have been marked as taking for the 03/10/23 encounter (Appointment) with Gilda Crease, Latina Craver, MD.    Past Medical History:  Diagnosis Date   Angioedema 08/07/2019   Arthritis    knees,shoulders, ankles, most joints   Cervical disc disorder    difficuly moving neck left   COPD (chronic obstructive pulmonary disease) (HCC)    chronic cough and wheezing   Dyspnea    easily   GERD (gastroesophageal reflux disease)    Headache    Hemorrhoids    History of hiatal hernia    Hypertension    controlled on meds   Pneumonia 04/2019   Prostate disorder    PTSD (post-traumatic stress disorder)    Schizophrenia (HCC)    Per patient's report.    Past Surgical History:   Procedure Laterality Date   ANTERIOR CERVICAL DECOMP/DISCECTOMY FUSION N/A 08/21/2022   Procedure: C5-6 ANTERIOR CERVICAL DISCECTOMY AND FUSION (GLOBUS HEDRON);  Surgeon: Venetia Night, MD;  Location: [facility];  Service: Neurosurgery;  Laterality: N/A;   CATARACT EXTRACTION W/PHACO Left 03/28/2020   Procedure: CATARACT EXTRACTION PHACO AND INTRAOCULAR LENS PLACEMENT (IOC) LEFT MALYUGIN  MILOOP,   5.29  00:41.0;  Surgeon: Galen Manila, MD;  Location: [facility];  Service: Ophthalmology;  Laterality: Left;   COLONOSCOPY WITH PROPOFOL N/A 12/14/2020   Procedure: COLONOSCOPY WITH PROPOFOL;  Surgeon: Wyline Mood, MD;  Location: [facility];  Service: Gastroenterology;  Laterality: N/A;   ESOPHAGOGASTRODUODENOSCOPY (EGD) WITH PROPOFOL N/A 10/18/2021   Procedure: ESOPHAGOGASTRODUODENOSCOPY (EGD) WITH PROPOFOL;  Surgeon: Pasty Spillers, MD;  Location: [facility] ENDOSCOPY;  Service: Endoscopy;  Laterality: N/A;   EYE SURGERY Left    injury from a nail   FOOT SURGERY Left     Social History Social History   Tobacco Use   Smoking status: Every Day    Packs/day: 2.50    Years: 45.00    Additional pack years: 0.00    Total pack years: 112.50    Types: Cigarettes   Smokeless tobacco: Never   Tobacco comments:    1.5PPD 09/23/2022  Vaping Use   Vaping Use: Never used  Substance Use Topics   Alcohol use: Yes    Alcohol/week: 8.0 standard drinks of  alcohol    Types: 8 Cans of beer per week    Comment: beer   Drug use: Never    Family History Family History  Problem Relation Age of Onset   Heart disease Mother    Osteoporosis Mother    Heart disease Father    Emphysema Father    Heart disease Sister    Emphysema Maternal Grandmother    Heart disease Maternal Grandfather    Osteoporosis Sister     Allergies  Allergen Reactions   Lisinopril Swelling    Reaction: angioedema   Iodinated Contrast Media Hives   Amlodipine     Dizziness and edema with this    Penicillins Rash    Reaction: Feels like skin is on fire Did it involve swelling of the face/tongue/throat, SOB, or low BP? No Did it involve sudden or severe rash/hives, skin peeling, or any reaction on the inside of your mouth or nose? No Did you need to seek medical attention at a hospital or doctor's office? Yes When did it last happen? 30 years ago If all above answers are "NO", may proceed with cephalosporin use.      REVIEW OF SYSTEMS (Negative unless checked)  Constitutional: [] Weight loss  [] Fever  [] Chills Cardiac: [] Chest pain   [] Chest pressure   [] Palpitations   [] Shortness of breath when laying flat   [] Shortness of breath with exertion. Vascular:  [x] Pain in legs with walking   [] Pain in legs at rest  [] History of DVT   [] Phlebitis   [] Swelling in legs   [] Varicose veins   [] Non-healing ulcers Pulmonary:   [] Uses home oxygen   [] Productive cough   [] Hemoptysis   [] Wheeze  [x] COPD   [] Asthma Neurologic:  [] Dizziness   [] Seizures   [] History of stroke   [] History of TIA  [] Aphasia   [] Vissual changes   [x] Weakness or numbness in arm   [x] Weakness or numbness in leg Musculoskeletal:   [] Joint swelling   [x] Joint pain   [] Low back pain Hematologic:  [] Easy bruising  [] Easy bleeding   [] Hypercoagulable state   [] Anemic Gastrointestinal:  [] Diarrhea   [] Vomiting  [x] Gastroesophageal reflux/heartburn   [] Difficulty swallowing. Genitourinary:  [] Chronic kidney disease   [] Difficult urination  [] Frequent urination   [] Blood in urine Skin:  [] Rashes   [] Ulcers  Psychological:  [] History of anxiety   []  History of major depression.  Physical Examination  There were no vitals filed for this visit. There is no height or weight on file to calculate BMI. Gen: WD/WN, NAD Head: [place]/AT, No temporalis wasting.  Ear/Nose/Throat: Hearing grossly intact, nares w/o erythema or drainage Eyes: PER, EOMI, sclera nonicteric.  Neck: Supple, no masses.  No bruit or JVD.  Pulmonary:  Good air  movement, no audible wheezing, no use of accessory muscles.  Cardiac: RRR, normal S1, S2, no Murmurs. Vascular:  carotid bruit noted Vessel Right Left  Radial Palpable Palpable  Carotid  Palpable  Palpable  Subclav  Palpable Palpable  Gastrointestinal: soft, non-distended. No guarding/no peritoneal signs.  Musculoskeletal: M/S 5/5 throughout.  No visible deformity.  Neurologic: CN 2-12 intact. Pain and light touch intact in extremities.  Symmetrical.  Speech is fluent. Motor exam as listed above. Psychiatric: Judgment intact, Mood & affect appropriate for pt's clinical situation. Dermatologic: No rashes or ulcers noted.  No changes consistent with cellulitis.   CBC Lab Results  Component Value Date   WBC 9.0 02/07/2023   HGB 15.4 02/07/2023   HCT 46.1 02/07/2023   MCV 91  02/07/2023   PLT 336 02/07/2023    BMET    Component Value Date/Time   NA 134 01/10/2023 0953   K 3.7 01/10/2023 0953   CL 93 (L) 01/10/2023 0953   CO2 21 01/10/2023 0953   GLUCOSE 68 (L) 01/10/2023 0953   GLUCOSE 99 08/23/2022 2258   BUN 15 01/10/2023 0953   CREATININE 0.89 01/10/2023 0953   CALCIUM 9.4 01/10/2023 0953   GFRNONAA >60 08/23/2022 2258   GFRAA 113 01/10/2021 1502   CrCl cannot be calculated (Patient's most recent lab result is older than the maximum 21 days allowed.).  COAG No results found for: "INR", "PROTIME"  Radiology No results found.   Assessment/Plan 1. Bilateral carotid artery stenosis Recommend:   Given the patient's asymptomatic subcritical stenosis no further invasive testing or surgery at this time.   Duplex ultrasound shows 1-39% RICA and 40-59% LICA stenosis.   Continue antiplatelet therapy as prescribed Continue management of CAD, HTN and Hyperlipidemia Healthy heart diet,  encouraged exercise at least 4 times per week Follow up in 12 months with duplex ultrasound and physical exam. - VAS US CAROTID - VAS US CAROTID; Future  2. PAD (peripheral artery  disease)  Recommend:  The patient has evidence of atherosclerosis of the lower extremities with claudication.  The patient does not voice lifestyle limiting changes at this point in time.  Noninvasive studies do not suggest clinically significant change.  No invasive studies, angiography or surgery at this time The patient should continue walking and begin a more formal exercise program.  The patient should continue antiplatelet therapy and aggressive treatment of the lipid abnormalities  No changes in the patient's medications at this time  Continued surveillance is indicated as atherosclerosis is likely to progress with time.    The patient will continue follow up with noninvasive studies as ordered.   3. Essential hypertension Continue antihypertensive medications as already ordered, these medications have been reviewed and there are no changes at this time.  4. Paraseptal emphysema Continue pulmonary medications and aerosols as already ordered, these medications have been reviewed and there are no changes at this time.   5. Mixed hyperlipidemia Continue statin as ordered and reviewed, no changes at this time     Levora Dredge, MD  03/09/2023 11:20 AM

## 2023-03-10 ENCOUNTER — Encounter (INDEPENDENT_AMBULATORY_CARE_PROVIDER_SITE_OTHER): Payer: Self-pay | Admitting: Vascular Surgery

## 2023-03-10 ENCOUNTER — Ambulatory Visit (INDEPENDENT_AMBULATORY_CARE_PROVIDER_SITE_OTHER): Payer: Medicaid Other | Admitting: Vascular Surgery

## 2023-03-10 ENCOUNTER — Ambulatory Visit (INDEPENDENT_AMBULATORY_CARE_PROVIDER_SITE_OTHER): Payer: Medicaid Other

## 2023-03-10 VITALS — BP 147/93 | HR 82 | Resp 18 | Ht 70.0 in | Wt 192.4 lb

## 2023-03-10 DIAGNOSIS — I6523 Occlusion and stenosis of bilateral carotid arteries: Secondary | ICD-10-CM

## 2023-03-10 DIAGNOSIS — J438 Other emphysema: Secondary | ICD-10-CM | POA: Diagnosis not present

## 2023-03-10 DIAGNOSIS — I739 Peripheral vascular disease, unspecified: Secondary | ICD-10-CM

## 2023-03-10 DIAGNOSIS — E782 Mixed hyperlipidemia: Secondary | ICD-10-CM

## 2023-03-10 DIAGNOSIS — I10 Essential (primary) hypertension: Secondary | ICD-10-CM | POA: Diagnosis not present

## 2023-04-06 NOTE — Patient Instructions (Signed)
Eating Plan for Chronic Obstructive Pulmonary Disease Chronic obstructive pulmonary disease (COPD) causes symptoms such as shortness of breath, coughing, and chest discomfort. These symptoms can make it difficult to eat enough to maintain a healthy weight. Generally, people with COPD should eat a diet that is high in calories, protein, and other nutrients to maintain body weight and to keep the lungs as healthy as possible. Depending on the medicines you take and other health conditions you may have, your health care provider may give you additional recommendations on what to eat or avoid. Talk with your health care provider about your goals for body weight, and work with a dietitian to develop an eating plan that is right for you. What are tips for following this plan? Reading food labels  Avoid foods with more than 300 milligrams (mg) of salt (sodium) per serving. Choose foods that contain at least 4 grams (g) of fiber per serving. Try to eat 20-30 g of fiber each day. Choose foods that are high in calories and protein, such as nuts, beans, yogurt, and cheese. Shopping Do not buy foods labeled as diet, low-calorie, or low-fat. If you are able to eat dairy products: Avoid low-fat or skim milk. Buy dairy products that have at least 2% fat. Buy nutritional supplement drinks. Buy grains and prepared foods labeled as enriched or fortified. Consider buying low-sodium, pre-made foods to conserve energy for eating. Cooking Add dry milk or protein powder to smoothies. Cook with healthy fats, such as olive oil, canola oil, sunflower oil, and grapeseed oil. Add oil, butter, cream cheese, or nut butters to foods to increase fat and calories. To make foods easier to chew and swallow: Cook vegetables, pasta, and rice until soft. Cut or grind meat into very small pieces. Dip breads in liquid. Meal planning  Eat when you feel hungry. Eat 5-6 small meals throughout the day. Drink 6-8 glasses of water  each day. Do not drink liquids with meals. Drink liquids at the end of the meal to avoid feeling full too quickly. Eat a variety of fruits and vegetables every day. Ask for assistance from family or friends with planning and preparing meals as needed. Avoid foods that cause you to feel bloated, such as carbonated drinks, fried foods, beans, broccoli, cabbage, and apples. For older adults, ask your local agency on aging whether you are eligible for meal assistance programs, such as Meals on Wheels. Lifestyle  Do not smoke. Eat slowly. Take small bites and chew food well before swallowing. Do not overeat. This may make it more difficult to breathe after eating. Sit up while eating. If needed, continue to use supplemental oxygen while eating. Rest or relax for 30 minutes before and after eating. Monitor your weight as told by your health care provider. Exercise as told by your health care provider. What foods should I eat? Fruits All fresh, dried, canned, or frozen fruits that do not cause gas. Vegetables All fresh, canned (no salt added), or frozen vegetables that do not cause gas. Grains Whole-grain bread. Enriched whole-grain pasta. Fortified whole-grain cereals. Fortified rice. Quinoa. Meats and other proteins Lean meat. Poultry. Fish. Dried beans. Unsalted nuts. Tofu. Eggs. Nut butters. Dairy Whole or 2% milk. Cheese. Yogurt. Fats and oils Olive oil. Canola oil. Butter. Margarine. Beverages Water. Vegetable juice (no salt added). Decaffeinated coffee. Decaffeinated or herbal tea. Seasonings and condiments Fresh or dried herbs. Low-salt or salt-free seasonings. Low-sodium soy sauce. The items listed above may not be a complete list of foods   and beverages you can eat. Contact a dietitian for more information. What foods should I avoid? Fruits Fruits that cause gas, such as apples or melon. Vegetables Vegetables that cause gas, such as broccoli, Brussels sprouts, cabbage,  cauliflower, and onions. Canned vegetables with added salt. Meats and other proteins Fried meat. Salt-cured meat. Processed meat. Dairy Fat-free or low-fat milk, yogurt, or cheese. Processed cheese. Beverages Carbonated drinks. Caffeinated drinks, such as coffee, tea, and soft drinks. Juice. Alcohol. Vegetable juice with added salt. Seasonings and condiments Salt. Seasoning mixes with salt. Soy sauce. Pickles. Other foods Clear soup or broth. Fried foods. Prepared frozen meals. The items listed above may not be a complete list of foods and beverages you should avoid. Contact a dietitian for more information. Summary COPD symptoms can make it difficult to eat enough to maintain a healthy weight. A COPD eating plan can help you maintain your body weight and keep your lungs as healthy as possible. Eat a diet that is high in calories, protein, and other nutrients. Read labels to make sure that you are getting the right nutrients. Cook foods to make them easier to chew and swallow. Eat 5-6 small meals throughout the day, and avoid foods that cause gas or make you feel bloated. This information is not intended to replace advice given to you by your health care provider. Make sure you discuss any questions you have with your health care provider. Document Revised: 09/12/2020 Document Reviewed: 09/12/2020 Elsevier Patient Education  2023 [employer]  

## 2023-04-10 ENCOUNTER — Encounter: Payer: Self-pay | Admitting: Nurse Practitioner

## 2023-04-10 ENCOUNTER — Ambulatory Visit: Payer: Medicaid Other | Admitting: Nurse Practitioner

## 2023-04-10 VITALS — BP 134/87 | HR 89 | Temp 97.8°F | Ht 70.0 in | Wt 190.0 lb

## 2023-04-10 DIAGNOSIS — F209 Schizophrenia, unspecified: Secondary | ICD-10-CM

## 2023-04-10 DIAGNOSIS — H9313 Tinnitus, bilateral: Secondary | ICD-10-CM

## 2023-04-10 DIAGNOSIS — R21 Rash and other nonspecific skin eruption: Secondary | ICD-10-CM

## 2023-04-10 DIAGNOSIS — I739 Peripheral vascular disease, unspecified: Secondary | ICD-10-CM

## 2023-04-10 DIAGNOSIS — F10288 Alcohol dependence with other alcohol-induced disorder: Secondary | ICD-10-CM | POA: Diagnosis not present

## 2023-04-10 DIAGNOSIS — I6523 Occlusion and stenosis of bilateral carotid arteries: Secondary | ICD-10-CM

## 2023-04-10 DIAGNOSIS — G894 Chronic pain syndrome: Secondary | ICD-10-CM

## 2023-04-10 DIAGNOSIS — J438 Other emphysema: Secondary | ICD-10-CM | POA: Diagnosis not present

## 2023-04-10 DIAGNOSIS — R42 Dizziness and giddiness: Secondary | ICD-10-CM

## 2023-04-10 DIAGNOSIS — E782 Mixed hyperlipidemia: Secondary | ICD-10-CM

## 2023-04-10 DIAGNOSIS — I700 Atherosclerosis of aorta: Secondary | ICD-10-CM

## 2023-04-10 DIAGNOSIS — I10 Essential (primary) hypertension: Secondary | ICD-10-CM

## 2023-04-10 DIAGNOSIS — F17210 Nicotine dependence, cigarettes, uncomplicated: Secondary | ICD-10-CM

## 2023-04-10 DIAGNOSIS — G959 Disease of spinal cord, unspecified: Secondary | ICD-10-CM | POA: Diagnosis not present

## 2023-04-10 DIAGNOSIS — F4312 Post-traumatic stress disorder, chronic: Secondary | ICD-10-CM

## 2023-04-10 DIAGNOSIS — Z8673 Personal history of transient ischemic attack (TIA), and cerebral infarction without residual deficits: Secondary | ICD-10-CM

## 2023-04-10 MED ORDER — PREDNISONE 10 MG PO TABS: ORAL_TABLET | ORAL | 0 refills | Status: DC

## 2023-04-10 MED ORDER — TRIAMCINOLONE ACETONIDE 0.1 % EX CREA: 1.0000 | TOPICAL_CREAM | Freq: Two times a day (BID) | CUTANEOUS | 2 refills | Status: DC

## 2023-04-10 NOTE — Assessment & Plan Note (Signed)
Ongoing.  Recent surgery.  Followed by neurosurgery.  Appreciate their input. ?dizziness and numbness related to this. 

## 2023-04-10 NOTE — Assessment & Plan Note (Signed)
History of infarct, continue collaboration with neuro and ENT for ongoing tinnitus, headaches, and dizziness.  Recent MRI remains stable.  Referral to neurology, as would benefit follow-up with them. 

## 2023-04-10 NOTE — Assessment & Plan Note (Signed)
Refer to schizophrenia plan of care. 

## 2023-04-10 NOTE — Assessment & Plan Note (Signed)
Chronic, ongoing.  Continue to collaborate with pulmonary, recommend he continue to attend scheduled visits with them.  Continue Breztri BID and Albuterol as needed at this time.  Continue yearly lung screening.  Recommend complete cessation of smoking.  

## 2023-04-10 NOTE — Assessment & Plan Note (Signed)
Is aware PCP does not perform chronic pain management.  If needed will place referral for new pain clinic in future.

## 2023-04-10 NOTE — Assessment & Plan Note (Addendum)
Recommend cutting back -- concern for overall health with ongoing heavy use and suspect major reason for his overall feeling bad.  Is aware of effect of heavy alcohol use on overall health, especially BP.   Check labs today to include CMP, CBC.  He refuses rehab, AA, or medications.

## 2023-04-10 NOTE — Assessment & Plan Note (Signed)
Ongoing issue since CVA with episodes daily.  Suspect more related to past CVA and heavy drinking, although he reports drinking helps the ringing.  He refuses to return to ENT at this time. 

## 2023-04-10 NOTE — Assessment & Plan Note (Signed)
Chronic, noted on CT lung screening.  Continue statin therapy + recommend complete cessation of smoking.  Lipid panel today. 

## 2023-04-10 NOTE — Assessment & Plan Note (Signed)
Chronic, stable.  BP at goal for patient today.  Continue to recommend reduction to cessation of alcohol use, however he continues to drink heavily.  Recommend cutting back on alcohol and smoking -- work towards complete cessation.  Recommend he monitor BP at least a few mornings a week at home and document.  DASH diet at home.  Labs today: CBC and CMP.

## 2023-04-10 NOTE — Assessment & Plan Note (Signed)
Ongoing issue post CVA -- will get him back into neurology - referral placed and recommend he attend.  Suspect some related to heavier alcohol use, recommend he cut back and work towards cessation.  However, he reports this helps vertigo. 

## 2023-04-10 NOTE — Assessment & Plan Note (Signed)
I have recommended complete cessation of tobacco use. I have discussed various options available for assistance with tobacco cessation including over the counter methods (Nicotine gum, patch and lozenges). We also discussed prescription options (Chantix, Nicotine Inhaler / Nasal Spray). The patient is not interested in pursuing any prescription tobacco cessation options at this time.  Continue yearly lung CA screening.  

## 2023-04-10 NOTE — Assessment & Plan Note (Signed)
Chronic, ongoing.  No current medications.  Tolerating this and does not wish to start medication.  Could consider Seroquel in future if needed or return to Clonidine.  Denies SI/HI. 

## 2023-04-10 NOTE — Assessment & Plan Note (Signed)
Continue collaboration with vascular on yearly basis, recent notes reviewed.

## 2023-04-10 NOTE — Progress Notes (Addendum)
BP 134/87   Pulse 89   Temp 97.8 F (36.6 C) (Oral)   Ht 5\' 10"  (1.778 m)   Wt 190 lb (86.2 kg)   SpO2 98%   BMI 27.26 kg/m    Subjective:    Patient ID: Luke Briggs., male    DOB: [birth year], 62 y.o.   MRN: [id number]  HPI: Luke Briggs. is a 62 y.o. male  Chief Complaint  Patient presents with   Hypertension   Hyperlipidemia   Mood   ETOH use   COPD Uses Breztri and Albuterol as needed.  Currently followed with pulmonary and saw last 09/23/22 -- recent exacerbation, pulmonary sent abx and Prednisone but he did not take as abx causes diarrhea.  Taking Montelukast.  Had last lung screening on 10/23/21 -- noting emphysema and aortic atherosclerosis.    Continues to smoke 1 PPD, has been smoking for >45 years.  Not interested in quitting.   Satisfied with current treatment?: yes Oxygen use: no Dyspnea frequency: minimal Cough frequency: none Rescue inhaler frequency:  2-3 times a day Limitation of activity: no Productive cough: none Last Spirometry: with pulmonary Pneumovax: Not up to Date Influenza: Up to Date  HYPERTENSION / HYPERLIPIDEMIA Continues on Benicar 40-25 dosing.  Last saw cardiology 02/12/22.  Drinking alcohol nightly -- 6-12 [employer] Lights a day -- has tried cutting back. Continues to report drinking helps his tinnitus and reports he feels worse if he does not drink.  About 4 beers and his ears stop ringing + feels more energy. Satisfied with current treatment? yes Duration of hypertension: chronic BP monitoring frequency: not checking BP range:  BP medication side effects: no Duration of hyperlipidemia: chronic Cholesterol medication side effects: no Cholesterol supplements: none Medication compliance: good compliance Aspirin: no Recent stressors: no Recurrent headaches: no Visual changes: no Palpitations: no Dyspnea: yes, ongoing at baseline Chest pain: no Lower extremity edema: no Dizzy/lightheaded: yes   RASH Started 2  weeks ago. Duration:  weeks  Location: generalized  Itching: yes Burning: no Redness: yes Oozing: no Scaling: yes Blisters: no Painful: no Fevers: no Change in detergents/soaps/personal care products: no Recent illness: no Recent travel:no History of same: yes Context: stable Alleviating factors: hydrocortisone cream Treatments attempted:hydrocortisone cream Shortness of breath: no  Throat/tongue swelling: no Myalgias/arthralgias: no   COPD Last visit to pulmonary on 11/13/22. Continues to smoke 1 PPD, has smoked since age 62.  Last lung CA screening 10/23/22 with emphysema and aortic atherosclerosis.  Continues to use Breztri BID.  Reports he will probably not return as they tried to stick him in little machine to test lungs and he is not going to do it.   COPD status: stable Satisfied with current treatment?: yes Oxygen use: no Dyspnea frequency: no Cough frequency: no Rescue inhaler frequency: every day Limitation of activity: no Productive cough: none Last Spirometry: with pulmonary Pneumovax: Up to Date Influenza: Up to Date   DIZZINESS/NUMBNESS Ongoing, chronic issues with fatigue/numbness, tinnitus, and dizziness -- has had lengthy work-up.  Known chronic right basal ganglia lacunar infarct on imaging and continues to complain of ongoing tinnitus, fatigue, numbness, and intermittent dizziness -- sees ENT & neuro for this as needed -- have recommended he return for visits, but he has not as of yet. Last MRI brain on 07/29/22 showed no acute changes and small chronic lacunar infarct.  Vascular visit last 02/01/21 with ultrasound showing widely patent RICA and 50% LICA stenosis, returned to see then on 03/10/23 with  no changes.  Does follow neurosurgery for cervical myelopathy and had C5-C6 anterior cervical discectomy on 08/21/22, followed up with them on 11/19/22 -- only needs to return to see as needed. Duration:  chronic Severity: 6/10  Onset: gradual Context when symptoms  started:  unknown Symptoms improve with rest: no  Depressive symptoms: yes Stress/anxiety: no Insomnia: yes hard to fall asleep Snoring: no Observed apnea by bed partner: no Daytime hypersomnolence:yes Wakes feeling refreshed: no History of sleep study: no Dysnea on exertion:  yes - at baseline Orthopnea/PND: no Chest pain: no Chronic cough: no Lower extremity edema: no Arthralgias:no Myalgias: no Weakness: yes at baseline Rash: no   SCHIZOPHRENIA No current medications.  Mood status: stable Satisfied with current treatment?: yes Symptom severity:mild Psychotherapy/counseling: in past Previous psychiatric medications: multiple in past Depressed mood: no Anxious mood: no Anhedonia: no Significant weight loss or gain: no Insomnia: yes hard to fall asleep Fatigue: no Feelings of worthlessness or guilt: no Impaired concentration/indecisiveness: no Suicidal ideations: no Hopelessness: no Crying spells: no    04/10/2023    8:18 AM 01/10/2023    9:47 AM 10/03/2022    8:55 AM 07/18/2022    8:32 AM 05/28/2022    9:31 AM  Depression screen PHQ 2/9  Decreased Interest 1 0 2 2 3   Down, Depressed, Hopeless 0 0 3 3 3   PHQ - 2 Score 1 0 5 5 6   Altered sleeping 1 0 3 3 3   Tired, decreased energy 3 0 3 3 3   Change in appetite 1 0 2 2 1   Feeling bad or failure about yourself  0 0 3 2 1   Trouble concentrating 2 0 3 3 3   Moving slowly or fidgety/restless 1 0 2 3 2   Suicidal thoughts 0 0 0 0 0  PHQ-9 Score 9 0 21 21 19   Difficult doing work/chores Very difficult Not difficult at all Extremely dIfficult Extremely dIfficult Very difficult       04/10/2023    8:18 AM 01/10/2023    9:47 AM 10/03/2022    8:56 AM 07/18/2022    8:32 AM  GAD 7 : Generalized Anxiety Score  Nervous, Anxious, on Edge 2 0 2 2  Control/stop worrying 0 0 3 3  Worry too much - different things 0 0 3 3  Trouble relaxing 2 0 3 3  Restless 0 0 2 1  Easily annoyed or irritable 3 0 3 3  Afraid - awful might  happen 2 0 3 3  Total GAD 7 Score 9 0 19 18  Anxiety Difficulty Very difficult Not difficult at all Extremely difficult Extremely difficult       Relevant past medical, surgical, family and social history reviewed and updated as indicated. Interim medical history since our last visit reviewed. Allergies and medications reviewed and updated.  Review of Systems  Constitutional:  Positive for fatigue. Negative for activity change, chills, diaphoresis and fever.  Respiratory:  Negative for cough, chest tightness, shortness of breath and wheezing.   Cardiovascular:  Negative for chest pain, palpitations and leg swelling.  Gastrointestinal: Negative.   Endocrine: Negative.   Neurological:  Positive for dizziness and numbness. Negative for syncope, weakness, light-headedness and headaches.  Psychiatric/Behavioral: Negative.     Per HPI unless specifically indicated above     Objective:    BP 134/87   Pulse 89   Temp 97.8 F (36.6 C) (Oral)   Ht 5\' 10"  (1.778 m)   Wt 190 lb (86.2 kg)  SpO2 98%   BMI 27.26 kg/m   Wt Readings from Last 3 Encounters:  04/10/23 190 lb (86.2 kg)  03/10/23 192 lb 6.4 oz (87.3 kg)  01/10/23 192 lb 11.2 oz (87.4 kg)    Physical Exam Vitals and nursing note reviewed.  Constitutional:      General: He is awake. He is not in acute distress.    Appearance: He is well-developed and well-groomed. He is not ill-appearing or toxic-appearing.  HENT:     Head: Normocephalic and atraumatic.     Right Ear: Hearing normal. No drainage.     Left Ear: Hearing normal. No drainage.  Eyes:     General: Lids are normal.        Right eye: No discharge.        Left eye: No discharge.     Conjunctiva/sclera: Conjunctivae normal.     Pupils: Pupils are equal, round, and reactive to light.     Visual Fields: Right eye visual fields normal and left eye visual fields normal.  Neck:     Thyroid: No thyromegaly.     Vascular: No carotid bruit.  Cardiovascular:     Rate  and Rhythm: Normal rate and regular rhythm.     Heart sounds: Normal heart sounds, S1 normal and S2 normal. No murmur heard.    No gallop.  Pulmonary:     Effort: Pulmonary effort is normal. No accessory muscle usage or respiratory distress.     Breath sounds: Wheezing present. No decreased breath sounds or rhonchi.     Comments: Scattered expiratory wheezes throughout per baseline. Abdominal:     General: Bowel sounds are normal. There is no distension.     Palpations: Abdomen is soft. There is no hepatomegaly.     Tenderness: There is no abdominal tenderness.     Hernia: No hernia is present.  Musculoskeletal:        General: Normal range of motion.     Cervical back: Normal range of motion and neck supple.     Right lower leg: No edema.     Left lower leg: No edema.  Skin:    General: Skin is warm and dry.     Capillary Refill: Capillary refill takes less than 2 seconds.     Findings: Rash present. Rash is urticarial.     Comments: Scattered rash to back and extremities.  Mild erythema, red patches with slight scaling.  Neurological:     Mental Status: He is alert and oriented to person, place, and time.     Cranial Nerves: Cranial nerves 2-12 are intact.     Sensory: Sensation is intact.     Motor: Motor function is intact.     Coordination: Coordination is intact.     Gait: Gait is intact.     Deep Tendon Reflexes: Reflexes are normal and symmetric.     Reflex Scores:      Brachioradialis reflexes are 2+ on the right side and 2+ on the left side.      Patellar reflexes are 2+ on the right side and 2+ on the left side. Psychiatric:        Attention and Perception: Attention normal.        Mood and Affect: Mood normal.        Speech: Speech normal.        Behavior: Behavior normal. Behavior is cooperative.        Thought Content: Thought content normal.    Results  for orders placed or performed in visit on 02/07/23  Uric acid  Result Value Ref Range   Uric Acid 4.8 3.8  - 8.4 mg/dL  Lipase  Result Value Ref Range   Lipase 33 13 - 78 U/L  Amylase  Result Value Ref Range   Amylase 80 31 - 110 U/L  CBC with Differential/Platelet  Result Value Ref Range   WBC 9.0 3.4 - 10.8 x10E3/uL   RBC 5.08 4.14 - 5.80 x10E6/uL   Hemoglobin 15.4 13.0 - 17.7 g/dL   Hematocrit 16.1 09.6 - 51.0 %   MCV 91 79 - 97 fL   MCH 30.3 26.6 - 33.0 pg   MCHC 33.4 31.5 - 35.7 g/dL   RDW 04.5 40.9 - 81.1 %   Platelets 336 150 - 450 x10E3/uL   Neutrophils 68 Not Estab. %   Lymphs 22 Not Estab. %   Monocytes 9 Not Estab. %   Eos 1 Not Estab. %   Basos 0 Not Estab. %   Neutrophils Absolute 6.0 1.4 - 7.0 x10E3/uL   Lymphocytes Absolute 2.0 0.7 - 3.1 x10E3/uL   Monocytes Absolute 0.8 0.1 - 0.9 x10E3/uL   EOS (ABSOLUTE) 0.1 0.0 - 0.4 x10E3/uL   Basophils Absolute 0.0 0.0 - 0.2 x10E3/uL   Immature Granulocytes 0 Not Estab. %   Immature Grans (Abs) 0.0 0.0 - 0.1 x10E3/uL      Assessment & Plan:   Problem List Items Addressed This Visit       Cardiovascular and Mediastinum   Aortic atherosclerosis (HCC)    Chronic, noted on CT lung screening.  Continue statin therapy + recommend complete cessation of smoking.  Lipid panel today.      Relevant Orders   Comprehensive metabolic panel   Lipid Panel w/o Chol/HDL Ratio   Carotid stenosis    Continue collaboration with vascular on yearly basis, recent notes reviewed.      Essential hypertension    Chronic, stable.  BP at goal for patient today.  Continue to recommend reduction to cessation of alcohol use, however he continues to drink heavily.  Recommend cutting back on alcohol and smoking -- work towards complete cessation.  Recommend he monitor BP at least a few mornings a week at home and document.  DASH diet at home.  Labs today: CBC and CMP.       Relevant Orders   CBC with Differential/Platelet   PAD (peripheral artery disease) (HCC)    Chronic, ongoing.  Followed by vascular, recent notes reviewed.      Relevant  Orders   Comprehensive metabolic panel   Lipid Panel w/o Chol/HDL Ratio     Respiratory   Paraseptal emphysema (HCC) - Primary    Chronic, ongoing.  Continue to collaborate with pulmonary, recommend he continue to attend scheduled visits with them.  Continue Breztri BID and Albuterol as needed at this time.  Continue yearly lung screening.  Recommend complete cessation of smoking.       Relevant Medications   predniSONE (DELTASONE) 10 MG tablet   Other Relevant Orders   CBC with Differential/Platelet     Nervous and Auditory   Cervical myelopathy (HCC)    Ongoing.  Recent surgery.  Followed by neurosurgery.  Appreciate their input. ?dizziness and numbness related to this.        Other   Alcohol dependence (HCC)    Recommend cutting back -- concern for overall health with ongoing heavy use and suspect major reason for his overall feeling  bad.  Is aware of effect of heavy alcohol use on overall health, especially BP.   Check labs today to include CMP, CBC.  He refuses rehab, AA, or medications.      Relevant Orders   Comprehensive metabolic panel   Chronic pain syndrome    Is aware PCP does not perform chronic pain management.  If needed will place referral for new pain clinic in future.      Relevant Medications   predniSONE (DELTASONE) 10 MG tablet   Chronic post-traumatic stress disorder (PTSD)    Refer to schizophrenia plan of care.      Chronic vertigo    Ongoing issue post CVA -- will get him back into neurology - referral placed and recommend he attend.  Suspect some related to heavier alcohol use, recommend he cut back and work towards cessation.  However, he reports this helps vertigo.      Relevant Orders   Ambulatory referral to Neurology   History of lacunar cerebrovascular accident    History of infarct, continue collaboration with neuro and ENT for ongoing tinnitus, headaches, and dizziness.  Recent MRI remains stable.  Referral to neurology, as would benefit  follow-up with them.      Relevant Orders   Ambulatory referral to Neurology   Mixed hyperlipidemia    Chronic, ongoing.  Continue current medication regimen and adjust as needed.  Lipid panel today.       Relevant Orders   Comprehensive metabolic panel   Lipid Panel w/o Chol/HDL Ratio   Nicotine dependence    I have recommended complete cessation of tobacco use. I have discussed various options available for assistance with tobacco cessation including over the counter methods (Nicotine gum, patch and lozenges). We also discussed prescription options (Chantix, Nicotine Inhaler / Nasal Spray). The patient is not interested in pursuing any prescription tobacco cessation options at this time.  Continue yearly lung CA screening.       Schizophrenia (HCC)    Chronic, ongoing.  No current medications.  Tolerating this and does not wish to start medication.  Could consider Seroquel in future if needed or return to Clonidine.  Denies SI/HI.      Tinnitus of both ears    Ongoing issue since CVA with episodes daily.  Suspect more related to past CVA and heavy drinking, although he reports drinking helps the ringing.  He refuses to return to ENT at this time.      Other Visit Diagnoses     Rash       Appears pruritic in nature.  ?allergy.  Will send in Prednisone taper which has worked in past and refills on steroid cream.  Return if ongoing.        Follow up plan: Return in about 3 months (around 07/11/2023) for COPD, HTN/HLD, MOOD, ETOH.

## 2023-04-10 NOTE — Assessment & Plan Note (Signed)
Chronic, ongoing.  Followed by vascular, recent notes reviewed.

## 2023-04-10 NOTE — Assessment & Plan Note (Signed)
Chronic, ongoing.  Continue current medication regimen and adjust as needed. Lipid panel today. 

## 2023-04-11 LAB — CBC WITH DIFFERENTIAL/PLATELET
Basophils Absolute: 0.1 10*3/uL (ref 0.0–0.2)
Basos: 1 %
EOS (ABSOLUTE): 0.1 10*3/uL (ref 0.0–0.4)
Eos: 1 %
Hematocrit: 46.8 % (ref 37.5–51.0)
Hemoglobin: 15.2 g/dL (ref 13.0–17.7)
Immature Grans (Abs): 0 10*3/uL (ref 0.0–0.1)
Immature Granulocytes: 0 %
Lymphocytes Absolute: 2.1 10*3/uL (ref 0.7–3.1)
Lymphs: 20 %
MCH: 29.5 pg (ref 26.6–33.0)
MCHC: 32.5 g/dL (ref 31.5–35.7)
MCV: 91 fL (ref 79–97)
Monocytes Absolute: 1 10*3/uL — ABNORMAL HIGH (ref 0.1–0.9)
Monocytes: 9 %
Neutrophils Absolute: 7.5 10*3/uL — ABNORMAL HIGH (ref 1.4–7.0)
Neutrophils: 69 %
Platelets: 403 10*3/uL (ref 150–450)
RBC: 5.16 x10E6/uL (ref 4.14–5.80)
RDW: 12.7 % (ref 11.6–15.4)
WBC: 10.8 10*3/uL (ref 3.4–10.8)

## 2023-04-11 LAB — COMPREHENSIVE METABOLIC PANEL
ALT: 19 IU/L (ref 0–44)
AST: 22 IU/L (ref 0–40)
Albumin/Globulin Ratio: 1.5 (ref 1.2–2.2)
Albumin: 4.6 g/dL (ref 3.8–4.9)
Alkaline Phosphatase: 80 IU/L (ref 44–121)
BUN/Creatinine Ratio: 17 (ref 9–20)
BUN: 20 mg/dL (ref 6–24)
Bilirubin Total: 0.6 mg/dL (ref 0.0–1.2)
CO2: 24 mmol/L (ref 20–29)
Calcium: 9.8 mg/dL (ref 8.7–10.2)
Chloride: 95 mmol/L — ABNORMAL LOW (ref 96–106)
Creatinine, Ser: 1.21 mg/dL (ref 0.76–1.27)
Globulin, Total: 3 g/dL (ref 1.5–4.5)
Glucose: 82 mg/dL (ref 70–99)
Potassium: 4.3 mmol/L (ref 3.5–5.2)
Sodium: 135 mmol/L (ref 134–144)
Total Protein: 7.6 g/dL (ref 6.0–8.5)
eGFR: 69 mL/min/{1.73_m2} (ref >59)

## 2023-04-11 LAB — LIPID PANEL W/O CHOL/HDL RATIO
Cholesterol, Total: 192 mg/dL (ref 100–199)
HDL: 61 mg/dL (ref >39)
LDL Chol Calc (NIH): 100 mg/dL — ABNORMAL HIGH (ref 0–99)
Triglycerides: 183 mg/dL — ABNORMAL HIGH (ref 0–149)
VLDL Cholesterol Cal: 31 mg/dL (ref 5–40)

## 2023-04-11 NOTE — Progress Notes (Signed)
Contacted via MyChart   Good afternoon Luke Briggs, your labs have returned: - CBC show normal white blood cell count, but very mild elevation in neutrophils.  May be related to current rash or recent infection.  We will recheck next visit.   - Kidney function, creatinine and eGFR, remains normal, as is liver function, AST and ALT.  - Cholesterol levels have trended up with LDL above goal.  Were you fasting?  Are you taking Atorvastatin every day?  Let me know, so I can determine whether we need to make changes in medication as we want LDL <70 for stroke prevention, new guidelines even state <55.  Any questions? Keep being amazing!!  Thank you for allowing me to participate in your care.  I appreciate you. Kindest regards, Jaelynn Currier

## 2023-04-16 ENCOUNTER — Other Ambulatory Visit: Payer: Self-pay | Admitting: Nurse Practitioner

## 2023-04-16 MED ORDER — OMEPRAZOLE 40 MG PO CPDR: 40.0000 mg | DELAYED_RELEASE_CAPSULE | Freq: Two times a day (BID) | ORAL | 4 refills | Status: DC

## 2023-04-16 NOTE — Addendum Note (Signed)
Addended by: Aura Dials T on: 04/16/2023 10:56 AM   Modules accepted: Orders

## 2023-04-16 NOTE — Telephone Encounter (Signed)
Unable to refill per protocol, Rx request was refilled 04/16/23 for 90 days, duplicate request.  Requested Prescriptions  Pending Prescriptions Disp Refills   omeprazole (PRILOSEC) 40 MG capsule [Pharmacy Med Name: OMEPRAZOLE DR 40 MG CAPSULE] 60 capsule 0    Sig: Take 1 capsule (40 mg total) by mouth in the morning and at bedtime.     Gastroenterology: Proton Pump Inhibitors Passed - 04/16/2023  9:54 AM      Passed - Valid encounter within last 12 months    Recent Outpatient Visits           6 days ago Paraseptal emphysema (HCC)   [place] [facility] [place], Bertrand T, NP   3 months ago Paraseptal emphysema (HCC)   [place] [facility] [place], Corrie Dandy T, NP   6 months ago Paraseptal emphysema [facility])   [place] [facility] [place], Dorie Rank, NP   9 months ago Paraseptal emphysema [facility])   [facility] [place], Dorie Rank, NP   10 months ago Paraseptal emphysema [facility])   [place] [facility] [place], Corrie Dandy T, NP       Future Appointments             In 2 weeks Stoioff, Verna Czech, MD [facility] [place]   In 2 months Ben[place]z, Dorie Rank, NP [place] [facility]

## 2023-05-02 ENCOUNTER — Encounter: Payer: Self-pay | Admitting: Urology

## 2023-05-02 ENCOUNTER — Ambulatory Visit (INDEPENDENT_AMBULATORY_CARE_PROVIDER_SITE_OTHER): Payer: Medicaid Other | Admitting: Urology

## 2023-05-02 VITALS — BP 144/89 | HR 80 | Ht 70.0 in | Wt 193.0 lb

## 2023-05-02 DIAGNOSIS — N401 Enlarged prostate with lower urinary tract symptoms: Secondary | ICD-10-CM | POA: Diagnosis not present

## 2023-05-02 DIAGNOSIS — N529 Male erectile dysfunction, unspecified: Secondary | ICD-10-CM

## 2023-05-02 LAB — MICROSCOPIC EXAMINATION: Bacteria, UA: NONE SEEN

## 2023-05-02 LAB — URINALYSIS, COMPLETE
Bilirubin, UA: NEGATIVE
Glucose, UA: NEGATIVE
Ketones, UA: NEGATIVE
Leukocytes,UA: NEGATIVE
Nitrite, UA: NEGATIVE
Protein,UA: NEGATIVE
RBC, UA: NEGATIVE
Specific Gravity, UA: 1.01 (ref 1.005–1.030)
Urobilinogen, Ur: 0.2 mg/dL (ref 0.2–1.0)
pH, UA: 5.5 (ref 5.0–7.5)

## 2023-05-02 LAB — BLADDER SCAN AMB NON-IMAGING: Scan Result: 15

## 2023-05-02 MED ORDER — TAMSULOSIN HCL 0.4 MG PO CAPS: 0.4000 mg | ORAL_CAPSULE | Freq: Two times a day (BID) | ORAL | 3 refills | Status: DC

## 2023-05-02 MED ORDER — FINASTERIDE 5 MG PO TABS: 5.0000 mg | ORAL_TABLET | Freq: Every day | ORAL | 3 refills | Status: DC

## 2023-05-02 NOTE — Progress Notes (Signed)
I, Luke Briggs,acting as a scribe for Luke Altes, MD.,have documented all relevant documentation on the behalf of Luke Altes, MD,as directed by  Luke Altes, MD while in the presence of Luke Altes, MD.  05/02/2023 10:05 AM   [name] 03-26-1961 [id number]  Referring provider: Marjie Skiff, NP [address] [place],  [place] [address]  Chief Complaint  Patient presents with   Benign Prostatic Hypertrophy   Urologic history: BPH with severe LUTS Tadalafil  Erectile dysfunction  HPI: Luke Briggs. is a 62 y.o. male presents for annual follow-up for medication refill.  Remains on Finasteride, Tamsulosin, and Tadalafil for a BPH. Still with severe symptoms with IPSS 24/35. Tadalafil is working well for his ED. PSA performed by PCP July 2023 was 0.4   PMH: Past Medical History:  Diagnosis Date   Angioedema 08/07/2019   Arthritis    knees,shoulders, ankles, most joints   Cervical disc disorder    difficuly moving neck left   COPD (chronic obstructive pulmonary disease) (HCC)    chronic cough and wheezing   Dyspnea    easily   GERD (gastroesophageal reflux disease)    Headache    Hemorrhoids    History of hiatal hernia    Hypertension    controlled on meds   Pneumonia 04/2019   Prostate disorder    PTSD (post-traumatic stress disorder)    Schizophrenia (HCC)    Per patient's report.    Surgical History: Past Surgical History:  Procedure Laterality Date   ANTERIOR CERVICAL DECOMP/DISCECTOMY FUSION N/A 08/21/2022   Procedure: C5-6 ANTERIOR CERVICAL DISCECTOMY AND FUSION (GLOBUS HEDRON);  Surgeon: Venetia Night, MD;  Location: [facility];  Service: Neurosurgery;  Laterality: N/A;   CATARACT EXTRACTION W/PHACO Left 03/28/2020   Procedure: CATARACT EXTRACTION PHACO AND INTRAOCULAR LENS PLACEMENT (IOC) LEFT MALYUGIN  MILOOP,   5.29  00:41.0;  Surgeon: Galen Manila, MD;  Location: [facility];  Service:  Ophthalmology;  Laterality: Left;   COLONOSCOPY WITH PROPOFOL N/A 12/14/2020   Procedure: COLONOSCOPY WITH PROPOFOL;  Surgeon: Wyline Mood, MD;  Location: [facility];  Service: Gastroenterology;  Laterality: N/A;   ESOPHAGOGASTRODUODENOSCOPY (EGD) WITH PROPOFOL N/A 10/18/2021   Procedure: ESOPHAGOGASTRODUODENOSCOPY (EGD) WITH PROPOFOL;  Surgeon: Pasty Spillers, MD;  Location: [facility] ENDOSCOPY;  Service: Endoscopy;  Laterality: N/A;   EYE SURGERY Left    injury from a nail   FOOT SURGERY Left     Home Medications:  Allergies as of 05/02/2023       Reactions   Lisinopril Swelling   Reaction: angioedema   Iodinated Contrast Media Hives   Amlodipine    Dizziness and edema with this   Penicillins Rash   Reaction: Feels like skin is on fire Did it involve swelling of the face/tongue/throat, SOB, or low BP? No Did it involve sudden or severe rash/hives, skin peeling, or any reaction on the inside of your mouth or nose? No Did you need to seek medical attention at a hospital or doctor's office? Yes When did it last happen? 30 years ago If all above answers are "NO", may proceed with cephalosporin use.        Medication List        Accurate as of May 02, 2023 10:05 AM. If you have any questions, ask your nurse or doctor.          acetaminophen 325 MG tablet Commonly known as: TYLENOL Take 2 tablets (650  mg total) by mouth every 4 (four) hours as needed for mild pain ((score 1 to 3) or temp > 100.5).   albuterol 108 (90 Base) MCG/ACT inhaler Commonly known as: VENTOLIN HFA Inhale 2 puffs into the lungs every 6 (six) hours as needed for wheezing orshortness of breath.   atorvastatin 10 MG tablet Commonly known as: LIPITOR Take 1 tablet (10 mg total) by mouth daily.   Breztri Aerosphere 160-9-4.8 MCG/ACT Aero Generic drug: Budeson-Glycopyrrol-Formoterol Inhale 2 puffs into the lungs in the morning and at bedtime.   EPINEPHrine 0.3 mg/0.3 mL Soaj injection Commonly  known as: EPI-PEN Inject 0.3 mLs (0.3 mg total) into the muscle as needed for anaphylaxis.   finasteride 5 MG tablet Commonly known as: PROSCAR Take 1 tablet (5 mg total) by mouth daily.   montelukast 10 MG tablet Commonly known as: SINGULAIR Take 10 mg by mouth at bedtime.   olmesartan-hydrochlorothiazide 40-25 MG tablet Commonly known as: Benicar HCT Take 1 tablet by mouth daily.   omeprazole 40 MG capsule Commonly known as: PRILOSEC Take 1 capsule (40 mg total) by mouth in the morning and at bedtime.   predniSONE 10 MG tablet Commonly known as: DELTASONE Take 6 tablets by mouth daily for 2 days, then reduce by 1 tablet every 2 days until gone   tadalafil 5 MG tablet Commonly known as: CIALIS TAKE ONE TABLET BY MOUTH DAILY AS NEEDED FOR ERECTILE DYSFUNCTION   tamsulosin 0.4 MG Caps capsule Commonly known as: FLOMAX Take 1 capsule (0.4 mg total) by mouth 2 (two) times daily. What changed: when to take this Changed by: Luke Altes, MD   triamcinolone cream 0.1 % Commonly known as: KENALOG Apply 1 Application topically 2 (two) times daily.   Vitamin D3 25 MCG (1000 UT) Caps Take 2 capsules (2,000 Units total) by mouth daily.        Allergies:  Allergies  Allergen Reactions   Lisinopril Swelling    Reaction: angioedema   Iodinated Contrast Media Hives   Amlodipine     Dizziness and edema with this   Penicillins Rash    Reaction: Feels like skin is on fire Did it involve swelling of the face/tongue/throat, SOB, or low BP? No Did it involve sudden or severe rash/hives, skin peeling, or any reaction on the inside of your mouth or nose? No Did you need to seek medical attention at a hospital or doctor's office? Yes When did it last happen? 30 years ago If all above answers are "NO", may proceed with cephalosporin use.     Family History: Family History  Problem Relation Age of Onset   Heart disease Mother    Osteoporosis Mother    Heart disease Father     Emphysema Father    Heart disease Sister    Emphysema Maternal Grandmother    Heart disease Maternal Grandfather    Osteoporosis Sister     Social History:  reports that he has been smoking cigarettes. He has a 112.50 pack-year smoking history. He has never used smokeless tobacco. He reports current alcohol use of about 8.0 standard drinks of alcohol per week. He reports that he does not use drugs.   Physical Exam: BP (!) 144/89   Pulse 80   Ht 5\' 10"  (1.778 m)   Wt 193 lb (87.5 kg)   BMI 27.69 kg/m   Constitutional:  Alert and oriented, No acute distress. HEENT: [place] AT, moist mucus membranes.  Trachea midline, no masses. Cardiovascular: No clubbing, cyanosis,  or edema. Respiratory: Normal respiratory effort, no increased work of breathing. GI: Abdomen is soft, nontender, nondistended, no abdominal masses Skin: No rashes, bruises or suspicious lesions. Neurologic: Grossly intact, no focal deficits, moving all 4 extremities. Psychiatric: Normal mood and affect.   Assessment & Plan:    1. BPH with severe LUTS  PVR today 15 mL  Increased Tamsulosin to 0.4 mg BID Finasteride/Tadalafil refill Standard and minimally invasive outlet procedures were discussed however, he states that symptoms are not bothersome enough that he desires to proceed with surgery.  2. Erectile dysfunction Doing well on Tadalafil Refill as above. Continue annual follow up  [facility] [address] [place], [place] [address] [phone]

## 2023-05-20 ENCOUNTER — Ambulatory Visit: Payer: Self-pay | Admitting: Neurosurgery

## 2023-06-06 ENCOUNTER — Telehealth: Payer: Self-pay | Admitting: Nurse Practitioner

## 2023-06-06 DIAGNOSIS — I10 Essential (primary) hypertension: Secondary | ICD-10-CM

## 2023-06-06 DIAGNOSIS — E782 Mixed hyperlipidemia: Secondary | ICD-10-CM

## 2023-06-06 DIAGNOSIS — I6523 Occlusion and stenosis of bilateral carotid arteries: Secondary | ICD-10-CM

## 2023-06-06 NOTE — Telephone Encounter (Signed)
Copied from CRM [id number]. Topic: Referral - Request for Referral >> Jun 06, 2023  8:15 AM Everette C wrote: Has patient seen PCP for this complaint? Yes.   *If NO, is insurance requiring patient see PCP for this issue before PCP can refer them? Referral for which specialty: Cardiology  Preferred provider/office: Patient has no preference but would like for them to be in [place], [place]  Reason for referral: Cardio concerns

## 2023-06-09 ENCOUNTER — Emergency Department
Admission: EM | Admit: 2023-06-09 | Discharge: 2023-06-09 | Disposition: A | Discharge status: Home / Self Care | Payer: MEDICAID | Attending: Emergency Medicine | Admitting: Emergency Medicine

## 2023-06-09 ENCOUNTER — Emergency Department: Payer: MEDICAID

## 2023-06-09 ENCOUNTER — Other Ambulatory Visit: Payer: Self-pay

## 2023-06-09 DIAGNOSIS — I10 Essential (primary) hypertension: Secondary | ICD-10-CM | POA: Insufficient documentation

## 2023-06-09 DIAGNOSIS — S20212A Contusion of left front wall of thorax, initial encounter: Secondary | ICD-10-CM | POA: Diagnosis not present

## 2023-06-09 DIAGNOSIS — W010XXA Fall on same level from slipping, tripping and stumbling without subsequent striking against object, initial encounter: Secondary | ICD-10-CM | POA: Diagnosis not present

## 2023-06-09 DIAGNOSIS — S299XXA Unspecified injury of thorax, initial encounter: Secondary | ICD-10-CM | POA: Diagnosis present

## 2023-06-09 MED ORDER — LIDOCAINE 5 % EX PTCH
1.0000 | MEDICATED_PATCH | CUTANEOUS | Status: DC
  Administered 2023-06-09: 1 via TRANSDERMAL
  Filled 2023-06-09: qty 1

## 2023-06-09 MED ORDER — LIDOCAINE 5 % EX PTCH: 1.0000 | MEDICATED_PATCH | Freq: Two times a day (BID) | CUTANEOUS | 0 refills | Status: AC

## 2023-06-09 MED ORDER — KETOROLAC TROMETHAMINE 15 MG/ML IJ SOLN
15.0000 mg | Freq: Once | INTRAMUSCULAR | Status: AC
  Administered 2023-06-09: 15 mg via INTRAMUSCULAR
  Filled 2023-06-09: qty 1

## 2023-06-09 NOTE — ED Provider Notes (Signed)
[facility] Provider Note    Event Date/Time   First MD Initiated Contact with Patient 06/09/23 0840     (approximate)   History   Fall   HPI  Luke Briggs. is a 62 y.o. male with a past medical history of opiate abuse, chronic pain syndrome, schizophrenia, PTSD who presents today for for evaluation of left anterior chest wall pain after he tripped over his dog bowl and landed on his left chest wall with his fist between his chest wall and the floor.  He reports that he has had pain ever since.  He does not take anticoagulation.  He has pain with truncal rotation and palpation of the area.  He denies head strike or LOC.  He does not take anticoagulation.  Patient Active Problem List   Diagnosis Date Noted   PAD (peripheral artery disease) (HCC) 03/09/2023   Vitamin D deficiency 12/29/2022   S/P cervical discectomy 09/01/2022   Cervical spinal stenosis 08/21/2022   Cervical myelopathy (HCC) 05/28/2022   Gastric erythema    Elevated serum GGT level 07/26/2021   Mixed hyperlipidemia 03/27/2021   History of lacunar cerebrovascular accident 02/13/2021   Tinnitus of both ears 02/13/2021   Migraines 02/13/2021   History of opioid abuse (HCC) 01/10/2021   Carotid stenosis 12/31/2020   Chronic vertigo 08/28/2020   Erectile dysfunction 03/22/2020   Aortic atherosclerosis (HCC) 09/29/2019   Bilateral primary osteoarthritis of knee 07/01/2019   Paraseptal emphysema (HCC) 06/17/2019   Controlled substance agreement signed 05/05/2019   Chronic pain syndrome 04/28/2019   BPH (benign prostatic hyperplasia) 04/28/2019   Eczema 04/28/2019   Essential hypertension 04/28/2019   GERD (gastroesophageal reflux disease) 04/28/2019   Schizophrenia (HCC) 04/28/2019   Chronic post-traumatic stress disorder (PTSD) 04/28/2019   Nicotine dependence 04/28/2019   Alcohol dependence (HCC) 04/28/2019          Physical Exam   Triage Vital Signs: ED Triage Vitals  [06/09/23 0823]  Encounter Vitals Group     BP (!) 142/85     Systolic BP Percentile      Diastolic BP Percentile      Pulse Rate 95     Resp 20     Temp 97.8 F (36.6 C)     Temp src      SpO2 96 %     Weight 200 lb (90.7 kg)     Height 5\' 10"  (1.778 m)     Head Circumference      Peak Flow      Pain Score 10     Pain Loc      Pain Education      Exclude from Growth Chart     Most recent vital signs: Vitals:   06/09/23 0823 06/09/23 1017  BP: (!) 142/85 (!) 140/80  Pulse: 95 88  Resp: 20 18  Temp: 97.8 F (36.6 C)   SpO2: 96% 96%    Physical Exam Vitals and nursing note reviewed.  Constitutional:      General: Awake and alert. No acute distress.    Appearance: Normal appearance. The patient is normal weight.  HENT:     Head: Normocephalic and atraumatic.     Mouth: Mucous membranes are moist.  Eyes:     General: PERRL. Normal EOMs        Right eye: No discharge.        Left eye: No discharge.     Conjunctiva/sclera: Conjunctivae normal.  Cardiovascular:  Rate and Rhythm: Normal rate and regular rhythm.     Pulses: Normal pulses.  Left anterior chest wall tenderness without ecchymosis, swelling, or abrasion noted. Pulmonary:     Effort: Pulmonary effort is normal. No respiratory distress.     Breath sounds: Normal breath sounds.  Abdominal:     Abdomen is soft. There is no abdominal tenderness. No rebound or guarding. No distention. Musculoskeletal:        General: No swelling. Normal range of motion.     Cervical back: Normal range of motion and neck supple.  No cervical spine tenderness, full normal range of motion of neck. Skin:    General: Skin is warm and dry.     Capillary Refill: Capillary refill takes less than 2 seconds.     Findings: No rash.  Neurological:     Mental Status: The patient is awake and alert.  At baseline, no focal neurological deficits.     ED Results / Procedures / Treatments   Labs (all labs ordered are listed, but  only abnormal results are displayed) Labs Reviewed - No data to display   EKG     RADIOLOGY I independently reviewed and interpreted imaging and agree with radiologists findings.     PROCEDURES:  Critical Care performed:   Procedures   MEDICATIONS ORDERED IN ED: Medications  lidocaine (LIDODERM) 5 % 1 patch (1 patch Transdermal Patch Applied 06/09/23 0908)  ketorolac (TORADOL) 15 MG/ML injection 15 mg (15 mg Intramuscular Given 06/09/23 0907)     IMPRESSION / MDM / ASSESSMENT AND PLAN / ED COURSE  I reviewed the triage vital signs and the nursing notes.   Differential diagnosis includes, but is not limited to, fracture, contusion, pneumothorax, costochondritis.  Patient is awake and alert, hemodynamically stable and afebrile.  He has normal oxygen saturation of 96% on room air.  This is his baseline as he is a tobacco user.  He is easily reproducible tenderness palpation to his left anterior chest wall.  There is no swelling or overlying skin changes noted.  He is able to speak easily in complete sentences and demonstrates no acute increased work of breathing.  He has no accessory muscle use, has normal and equal lung sounds bilaterally.  X-ray obtained and reveals no acute findings.  He was treated symptomatically with improvement of his symptoms.  Discussed the importance of continuing to take deep breaths in order to prevent atelectasis and possible subsequent pneumonia.  There was no head strike or LOC, no indication for CT imaging per [place] criteria.  He has no pain or injury elsewhere.  We discussed return precautions and outpatient follow-up.  Patient or stands and agrees with plan.  He was discharged in stable condition.   Patient's presentation is most consistent with acute complicated illness / injury requiring diagnostic workup.      FINAL CLINICAL IMPRESSION(S) / ED DIAGNOSES   Final diagnoses:  Rib contusion, left, initial encounter     Rx / DC  Orders   ED Discharge Orders          Ordered    lidocaine (LIDODERM) 5 %  Every 12 hours        06/09/23 1011             Note:  This document was prepared using Dragon voice recognition software and may include unintentional dictation errors.   Keturah Shavers 06/09/23 1119    Pilar Jarvis, MD 06/09/23 [phone]

## 2023-06-09 NOTE — Discharge Instructions (Addendum)
Your x-ray does not show any broken ribs or collapsed lungs.  Please use the Lidoderm patches as prescribed.  Please return for any new, worsening, or change in symptoms or other concerns.  It was a pleasure caring for you today.

## 2023-06-09 NOTE — ED Triage Notes (Signed)
Pt to ED for tripping over dog bowl Saturday, fell and hit chest. Denies hitting head or LOC.  Reports pain to left rib cage. Pain worsens with movement and inspiration

## 2023-06-11 ENCOUNTER — Other Ambulatory Visit: Payer: Self-pay | Admitting: Nurse Practitioner

## 2023-06-11 ENCOUNTER — Telehealth: Payer: Self-pay

## 2023-06-11 NOTE — Transitions of Care (Post Inpatient/ED Visit) (Signed)
   06/11/2023  Name: Luke Briggs. MRN: [id number] DOB: 1961-03-27  Today's TOC FU Call Status: Today's TOC FU Call Status:: Unsuccessul Call (1st Attempt) Unsuccessful Call (1st Attempt) Date: 06/11/23  Attempted to reach the patient regarding the most recent Inpatient/ED visit.  Follow Up Plan: Additional outreach attempts will be made to reach the patient to complete the Transitions of Care (Post Inpatient/ED visit) call.   Signature [place], [facility]

## 2023-06-12 ENCOUNTER — Ambulatory Visit: Payer: Self-pay | Admitting: *Deleted

## 2023-06-12 NOTE — Telephone Encounter (Signed)
  Chief Complaint: severe chest pain s/p recent fall Saturday now difficulty breathing wants pain medication  Symptoms: chest pain in ribs. Grunting in pain  Frequency: fell Saturday  Pertinent Negatives: Patient denies sweating no nausea no dizziness reported  Disposition: [x] ED /[] Urgent Care (no appt availability in office) / [] Appointment(In office/virtual)/ []  [place] [facility]/ [] Home Care/ [] Refused Recommended Disposition /[] [place] [facility]/ []  Follow-up with PCP Additional Notes:   Recommended ED now and have someone else to drive. Unsure of disposition . Wants PCP notified.     Reason for Disposition  SEVERE chest pain  Answer Assessment - Initial Assessment Questions 1. LOCATION: "Where does it hurt?"       Ribs  2. RADIATION: "Does the pain go anywhere else?" (e.g., into neck, jaw, arms, back)     na 3. ONSET: "When did the chest pain begin?" (Minutes, hours or days)      Worsening since Saturday  4. PATTERN: "Does the pain come and go, or has it been constant since it started?"  "Does it get worse with exertion?"      Constant grunted in pain chest pain and difficulty breathing  5. DURATION: "How long does it last" (e.g., seconds, minutes, hours)     na 6. SEVERITY: "How bad is the pain?"  (e.g., Scale 1-10; mild, moderate, or severe)    - MILD (1-3): doesn't interfere with normal activities     - MODERATE (4-7): interferes with normal activities or awakens from sleep    - SEVERE (8-10): excruciating pain, unable to do any normal activities       severe 7. CARDIAC RISK FACTORS: "Do you have any history of heart problems or risk factors for heart disease?" (e.g., angina, prior heart attack; diabetes, high blood pressure, high cholesterol, smoker, or strong family history of heart disease)     na 8. PULMONARY RISK FACTORS: "Do you have any history of lung disease?"  (e.g., blood clots in lung, asthma, emphysema, birth control pills)     na 9. CAUSE: "What do  you think is causing the chest pain?"     Fell on Saturday 10. OTHER SYMPTOMS: "Do you have any other symptoms?" (e.g., dizziness, nausea, vomiting, sweating, fever, difficulty breathing, cough)       Severe pain in chest due to fall rib pain "feels like stabbing pain ripping apart inside" 11. PREGNANCY: "Is there any chance you are pregnant?" "When was your last menstrual period?"       na  Protocols used: Chest Pain-A-AH

## 2023-06-12 NOTE — Telephone Encounter (Signed)
Called and LVM asking for patient to please return my call. LVM advising patient that [name] recommends he be seen at the ER.

## 2023-06-12 NOTE — Telephone Encounter (Signed)
Requested Prescriptions  Pending Prescriptions Disp Refills   atorvastatin (LIPITOR) 10 MG tablet [Pharmacy Med Name: ATORVASTATIN 10 MG TABLET] 90 tablet 0    Sig: Take 1 tablet (10 mg total) by mouth daily.     Cardiovascular:  Antilipid - Statins Failed - 06/11/2023  8:52 AM      Failed - Lipid Panel in normal range within the last 12 months    Cholesterol, Total  Date Value Ref Range Status  04/10/2023 192 100 - 199 mg/dL Final   Cholesterol Piccolo, Waived  Date Value Ref Range Status  03/22/2020 171 <200 mg/dL Final    Comment:                            Desirable                <200                         Borderline High      200- 239                         High                     >239    LDL Chol Calc ([facility])  Date Value Ref Range Status  04/10/2023 100 (H) 0 - 99 mg/dL Final   HDL  Date Value Ref Range Status  04/10/2023 61 >39 mg/dL Final   Triglycerides  Date Value Ref Range Status  04/10/2023 183 (H) 0 - 149 mg/dL Final   Triglycerides Piccolo,Waived  Date Value Ref Range Status  03/22/2020 173 (H) <150 mg/dL Final    Comment:                            Normal                   <150                         Borderline High     150 - 199                         High                200 - 499                         Very High                >499          Passed - Patient is not pregnant      Passed - Valid encounter within last 12 months    Recent Outpatient Visits           2 months ago Paraseptal emphysema (HCC)   [facility] [place], [place]ull[place] T, NP   5 months ago Paraseptal emphysema (HCC)   [facility] [place], Witmer T, NP   8 months ago Paraseptal emphysema (HCC)   [facility] [place], [place] T, NP   10 months ago Paraseptal emphysema [facility])   [place] [facility] [place], Corrie Dandy T, NP   1 year ago Paraseptal emphysema (HCC)  [facility] [place], Dorie Rank, NP       Future Appointments             In 4 weeks [name], Dorie Rank, NP [place] [facility]   In 10 months Stoioff, Verna Czech, MD [facility]

## 2023-06-13 ENCOUNTER — Other Ambulatory Visit: Payer: Self-pay

## 2023-06-13 ENCOUNTER — Emergency Department
Admission: EM | Admit: 2023-06-13 | Discharge: 2023-06-13 | Disposition: A | Discharge status: Home / Self Care | Payer: MEDICAID | Attending: Student in an Organized Health Care Education/Training Program | Admitting: Student in an Organized Health Care Education/Training Program

## 2023-06-13 ENCOUNTER — Telehealth: Payer: Self-pay | Admitting: Nurse Practitioner

## 2023-06-13 ENCOUNTER — Emergency Department: Payer: MEDICAID

## 2023-06-13 ENCOUNTER — Encounter: Payer: Self-pay | Admitting: Emergency Medicine

## 2023-06-13 DIAGNOSIS — U071 COVID-19: Secondary | ICD-10-CM

## 2023-06-13 DIAGNOSIS — R0789 Other chest pain: Secondary | ICD-10-CM | POA: Diagnosis present

## 2023-06-13 DIAGNOSIS — W19XXXA Unspecified fall, initial encounter: Secondary | ICD-10-CM | POA: Insufficient documentation

## 2023-06-13 DIAGNOSIS — I10 Essential (primary) hypertension: Secondary | ICD-10-CM | POA: Diagnosis not present

## 2023-06-13 DIAGNOSIS — J439 Emphysema, unspecified: Secondary | ICD-10-CM | POA: Diagnosis not present

## 2023-06-13 LAB — CBC WITH DIFFERENTIAL/PLATELET
Abs Immature Granulocytes: 0.03 10*3/uL (ref 0.00–0.07)
Basophils Absolute: 0 10*3/uL (ref 0.0–0.1)
Basophils Relative: 0 %
Eosinophils Absolute: 0 10*3/uL (ref 0.0–0.5)
Eosinophils Relative: 0 %
HCT: 41.8 % (ref 39.0–52.0)
Hemoglobin: 15 g/dL (ref 13.0–17.0)
Immature Granulocytes: 0 %
Lymphocytes Relative: 15 %
Lymphs Abs: 1.1 10*3/uL (ref 0.7–4.0)
MCH: 30.9 pg (ref 26.0–34.0)
MCHC: 35.9 g/dL (ref 30.0–36.0)
MCV: 86 fL (ref 80.0–100.0)
Monocytes Absolute: 1.4 10*3/uL — ABNORMAL HIGH (ref 0.1–1.0)
Monocytes Relative: 18 %
Neutro Abs: 5 10*3/uL (ref 1.7–7.7)
Neutrophils Relative %: 67 %
Platelets: 301 10*3/uL (ref 150–400)
RBC: 4.86 MIL/uL (ref 4.22–5.81)
RDW: 12.2 % (ref 11.5–15.5)
WBC: 7.5 10*3/uL (ref 4.0–10.5)
nRBC: 0 % (ref 0.0–0.2)

## 2023-06-13 LAB — BASIC METABOLIC PANEL
Anion gap: 13 (ref 5–15)
BUN: 10 mg/dL (ref 6–20)
CO2: 25 mmol/L (ref 22–32)
Calcium: 9 mg/dL (ref 8.9–10.3)
Chloride: 86 mmol/L — ABNORMAL LOW (ref 98–111)
Creatinine, Ser: 0.85 mg/dL (ref 0.61–1.24)
GFR, Estimated: >60 mL/min (ref >60)
Glucose, Bld: 100 mg/dL — ABNORMAL HIGH (ref 70–99)
Potassium: 3.9 mmol/L (ref 3.5–5.1)
Sodium: 124 mmol/L — ABNORMAL LOW (ref 135–145)

## 2023-06-13 LAB — TROPONIN I (HIGH SENSITIVITY): Troponin I (High Sensitivity): 4 ng/L (ref <18)

## 2023-06-13 LAB — D-DIMER, QUANTITATIVE: D-Dimer, Quant: 0.76 ug/mL-FEU — ABNORMAL HIGH (ref 0.00–0.50)

## 2023-06-13 LAB — SARS CORONAVIRUS 2 BY RT PCR: SARS Coronavirus 2 by RT PCR: POSITIVE — AB

## 2023-06-13 MED ORDER — DIPHENHYDRAMINE HCL 50 MG/ML IJ SOLN: 50.0000 mg | Freq: Once | INTRAMUSCULAR | Status: DC

## 2023-06-13 MED ORDER — METHYLPREDNISOLONE SODIUM SUCC 40 MG IJ SOLR
40.0000 mg | Freq: Once | INTRAMUSCULAR | Status: DC
  Filled 2023-06-13: qty 1

## 2023-06-13 MED ORDER — SODIUM CHLORIDE 0.9 % IV BOLUS
1000.0000 mL | Freq: Once | INTRAVENOUS | Status: AC
  Administered 2023-06-13: 1000 mL via INTRAVENOUS

## 2023-06-13 MED ORDER — KETOROLAC TROMETHAMINE 15 MG/ML IJ SOLN
15.0000 mg | Freq: Once | INTRAMUSCULAR | Status: AC
  Administered 2023-06-13: 15 mg via INTRAMUSCULAR
  Filled 2023-06-13: qty 1

## 2023-06-13 MED ORDER — DIPHENHYDRAMINE HCL 25 MG PO CAPS: 50.0000 mg | ORAL_CAPSULE | Freq: Once | ORAL | Status: DC

## 2023-06-13 MED ORDER — OXYCODONE HCL 5 MG PO TABS
5.0000 mg | ORAL_TABLET | Freq: Once | ORAL | Status: AC
  Administered 2023-06-13: 5 mg via ORAL
  Filled 2023-06-13: qty 1

## 2023-06-13 NOTE — ED Provider Notes (Signed)
[facility] Provider Note    Event Date/Time   First MD Initiated Contact with Patient 06/13/23 [phone]     (approximate)   History   Fall and URI   HPI  Luke Briggs. is a 62 y.o. male who presents today for evaluation of left anterior chest pain after he had a fall on Monday.  Patient reports that the following day, Tuesday he developed cough, nasal congestion, and sneezing.  He reports that with his sneezing his chest pain has gotten worse.  He denies fevers or chills.  He reports that he has been using Lidoderm patches without significant improvement of his symptoms.  He denies nausea or diaphoresis.  Patient is requesting blood work because last time he felt like this his potassium was low.  Patient Active Problem List   Diagnosis Date Noted   PAD (peripheral artery disease) (HCC) 03/09/2023   Vitamin D deficiency 12/29/2022   S/P cervical discectomy 09/01/2022   Cervical spinal stenosis 08/21/2022   Cervical myelopathy (HCC) 05/28/2022   Gastric erythema    Elevated serum GGT level 07/26/2021   Mixed hyperlipidemia 03/27/2021   History of lacunar cerebrovascular accident 02/13/2021   Tinnitus of both ears 02/13/2021   Migraines 02/13/2021   History of opioid abuse (HCC) 01/10/2021   Carotid stenosis 12/31/2020   Chronic vertigo 08/28/2020   Erectile dysfunction 03/22/2020   Aortic atherosclerosis (HCC) 09/29/2019   Bilateral primary osteoarthritis of knee 07/01/2019   Paraseptal emphysema (HCC) 06/17/2019   Controlled substance agreement signed 05/05/2019   Chronic pain syndrome 04/28/2019   BPH (benign prostatic hyperplasia) 04/28/2019   Eczema 04/28/2019   Essential hypertension 04/28/2019   GERD (gastroesophageal reflux disease) 04/28/2019   Schizophrenia (HCC) 04/28/2019   Chronic post-traumatic stress disorder (PTSD) 04/28/2019   Nicotine dependence 04/28/2019   Alcohol dependence (HCC) 04/28/2019          Physical Exam    Triage Vital Signs: ED Triage Vitals [06/13/23 0747]  Encounter Vitals Group     BP (!) 162/90     Systolic BP Percentile      Diastolic BP Percentile      Pulse Rate (!) 104     Resp 16     Temp 98.7 F (37.1 C)     Temp Source Oral     SpO2 98 %     Weight 200 lb (90.7 kg)     Height 5\' 10"  (1.778 m)     Head Circumference      Peak Flow      Pain Score 10     Pain Loc      Pain Education      Exclude from Growth Chart     Most recent vital signs: Vitals:   06/13/23 0747  BP: (!) 162/90  Pulse: (!) 104  Resp: 16  Temp: 98.7 F (37.1 C)  SpO2: 98%    Physical Exam Vitals and nursing note reviewed.  Constitutional:      General: Awake and alert. No acute distress.    Appearance: Normal appearance. The patient is normal weight.  HENT:     Head: Normocephalic and atraumatic.     Mouth: Mucous membranes are moist.  Eyes:     General: PERRL. Normal EOMs        Right eye: No discharge.        Left eye: No discharge.     Conjunctiva/sclera: Conjunctivae normal.  Cardiovascular:     Rate and  Rhythm: Normal rate and regular rhythm.     Pulses: Normal pulses.     Heart sounds: Normal heart sounds Left anterior chest wall tenderness to palpation.  No ecchymosis noted. Pulmonary:     Effort: Pulmonary effort is normal. No respiratory distress.  Able to speak easily in complete sentences    Breath sounds: Normal breath sounds.  Abdominal:     Abdomen is soft. There is no abdominal tenderness. No rebound or guarding. No distention. Musculoskeletal:        General: No swelling. Normal range of motion.     Cervical back: Normal range of motion and neck supple.  No lower extremity edema Skin:    General: Skin is warm and dry.     Capillary Refill: Capillary refill takes less than 2 seconds.     Findings: No rash.  Neurological:     Mental Status: The patient is awake and alert.      ED Results / Procedures / Treatments   Labs (all labs ordered are listed,  but only abnormal results are displayed) Labs Reviewed  SARS CORONAVIRUS 2 BY RT PCR - Abnormal; Notable for the following components:      Result Value   SARS Coronavirus 2 by RT PCR POSITIVE (*)    All other components within normal limits  CBC WITH DIFFERENTIAL/PLATELET - Abnormal; Notable for the following components:   Monocytes Absolute 1.4 (*)    All other components within normal limits  BASIC METABOLIC PANEL - Abnormal; Notable for the following components:   Sodium 124 (*)    Chloride 86 (*)    Glucose, Bld 100 (*)    All other components within normal limits  D-DIMER, QUANTITATIVE - Abnormal; Notable for the following components:   D-Dimer, Quant 0.76 (*)    All other components within normal limits  TROPONIN I (HIGH SENSITIVITY)     EKG     RADIOLOGY I independently reviewed and interpreted imaging and agree with radiologists findings.     PROCEDURES:  Critical Care performed:   Procedures   MEDICATIONS ORDERED IN ED: Medications  methylPREDNISolone sodium succinate (SOLU-MEDROL) 40 mg/mL injection 40 mg (40 mg Intravenous Not Given 06/13/23 0905)  diphenhydrAMINE (BENADRYL) capsule 50 mg (has no administration in time range)    Or  diphenhydrAMINE (BENADRYL) injection 50 mg (has no administration in time range)  ketorolac (TORADOL) 15 MG/ML injection 15 mg (15 mg Intramuscular Given 06/13/23 0811)  oxyCODONE (Oxy IR/ROXICODONE) immediate release tablet 5 mg (5 mg Oral Given 06/13/23 0812)  sodium chloride 0.9 % bolus 1,000 mL (0 mLs Intravenous Stopped 06/13/23 0944)     IMPRESSION / MDM / ASSESSMENT AND PLAN / ED COURSE  I reviewed the triage vital signs and the nursing notes.   Differential diagnosis includes, but is not limited to, rib fracture, pneumonia, contusion, pneumothorax.  I reviewed the patient's chart.  He was seen on 06/09/2023 and had a chest x-ray which was negative for any acute findings.  He was treated symptomatically and ultimately  discharged.  Patient presents emergency department today with a normal oxygen saturation of 98% on room air.  He is afebrile.  He demonstrates no increased work of breathing.  He was placed on the cardiac monitor.  COVID swab obtained in triage.  Given patient's worsening symptoms, I have also added on blood work.  Given that he was mildly tachycardic to 104 on arrival, I also added a D-dimer.  EKG obtained in triage is negative for  any acute ischemic changes, similar to previous.  X-ray of his chest does not reveal any cardiopulmonary abnormality or rib fracture.  There is no development of bacterial pneumonia which we had discussed previously is a risk if he does not take deep breaths.  Troponin is negative.  There is no leukocytosis.  Patient's D-dimer is found to be slightly elevated, possibly due to his COVID-positive status.  He has never had a blood clot in the past, he has no clinical signs or symptoms of DVT currently.  I do suspect that his chest wall discomfort is due to his known fall/injury, and worsened by his coughing and sneezing.  However, I did recommend that we obtain a CT angiogram for further evaluation of pulmonary embolism given his elevated D-dimer and his tachycardia.  However, patient has an allergy to IV contrast.  I recommended premedication per our protocol.  Patient did not wish to wait for this process and declined CT angiogram.  He actually requested to go home at this time and does not want any further workup today.  He understands the risks of missing a pulmonary embolism.  We did discuss very strict return precautions, he understands and agrees.  We also discussed symptomatic management from a chest wall pain standpoint as well as from a COVID standpoint.  We also discussed isolation instructions unless he requires further medical attention.  Patient and his significant other understand and agree with plan.  He was discharged in stable condition.   Patient's presentation  is most consistent with acute presentation with potential threat to life or bodily function.   Clinical Course as of 06/13/23 1152  Fri Jun 13, 2023  1914 Patient does not wish to do the premedication process for the CT PE.  He is declining PE at this time [JP]  0914 Patient is requesting to be discharged at this time.   [JP]    Clinical Course User Index [JP] Kavya Haag, Herb Grays, PA-C     FINAL CLINICAL IMPRESSION(S) / ED DIAGNOSES   Final diagnoses:  COVID-19  Chest wall pain     Rx / DC Orders   ED Discharge Orders     None        Note:  This document was prepared using Dragon voice recognition software and may include unintentional dictation errors.   Keturah Shavers 06/13/23 1152    Willy Eddy, MD 06/13/23 1309

## 2023-06-13 NOTE — ED Triage Notes (Signed)
Pt to ED via POV. Pt states that he fell on Monday. Pt was seen at that time and was told that he bruised his ribs. Pt states that since then he has developed cold symptoms. Pt reports pain when coughing. Pt states that he did have 1 episode of vomiting yesterday from coughing so much. Pt reports that he called his PCP and she recommended that he come back to the ED to get more imaging.

## 2023-06-13 NOTE — Discharge Instructions (Signed)
You did not wish to undergo CT imaging today for evaluation of pulmonary embolism.  If you develop worsening pain with deep inspiration, trouble breathing, blood in your sputum, or any other new, worsening, or change in symptoms or other concerns, please return the emergency department.  You were found to be positive for COVID-19.  Please remain in isolation as much as possible, and if you must be around other people, then please wear a mask.  You should quarantine at home for 5 days unless you require medical attention, when you return to work, you wish to wear a mask for an additional 5 days.  Please return for any new, worsening, or change in symptoms or other concerns.  It was a pleasure caring for you today.

## 2023-06-13 NOTE — Telephone Encounter (Signed)
Pt is calling for advice - Pt tested positive for COVID at the ED. The ED stated that they want to check for a PE but it would take 4-6 hours. Would like advice if an order through the office can be placed for imaging faster? Please advise  CB- [phone]

## 2023-06-13 NOTE — Telephone Encounter (Signed)
Patient notified of [name]'s message.   °

## 2023-06-20 ENCOUNTER — Other Ambulatory Visit: Payer: Self-pay | Admitting: Nurse Practitioner

## 2023-06-20 NOTE — Telephone Encounter (Signed)
Requested medication (s) are due for refill today - yes  Requested medication (s) are on the active medication list -yes  Future visit scheduled -yes  Last refill: 05/28/22 10.7g 12RF  Notes to clinic: off protocol- provider review   Requested Prescriptions  Pending Prescriptions Disp Refills   BREZTRI AEROSPHERE 160-9-4.8 MCG/ACT AERO [Pharmacy Med Name: BREZTRI AEROSPHERE INHALER] 10.7 g 0    Sig: Inhale 2 puffs into the lungs in the morning and at bedtime.     Off-Protocol Failed - 06/20/2023 10:48 AM      Failed - Medication not assigned to a protocol, review manually.      Passed - Valid encounter within last 12 months    Recent Outpatient Visits           2 months ago Paraseptal emphysema (HCC)   [place] [facility] [place], Corrie Dandy T, NP   5 months ago Paraseptal emphysema (HCC)   [place] [facility] [place], Corrie Dandy T, NP   8 months ago Paraseptal emphysema [facility])   [place] [facility] [place], Dorie Rank, NP   11 months ago Paraseptal emphysema [facility])   [facility] [place], Corrie Dandy T, NP   1 year ago [facility]aseptal emphysema [facility])   [place] [facility] [place], Dorie Rank, NP       Future Appointments             In 3 weeks [name], Dorie Rank, NP [place] [facility]   In 3 weeks Charlsie Quest, NP [facility] [place]   In 10 months Stoioff, Verna Czech, MD [facility] [place]               Requested Prescriptions  Pending Prescriptions Disp Refills   BREZTRI AEROSPHERE 160-9-4.8 MCG/ACT AERO [Pharmacy Med Name: BREZTRI AEROSPHERE INHALER] 10.7 g 0    Sig: Inhale 2 puffs into the lungs in the morning and at bedtime.     Off-Protocol Failed - 06/20/2023 10:48 AM      Failed - Medication not assigned to a protocol, review manually.      Passed - Valid encounter within last 12 months    Recent Outpatient Visits           2 months  ago Paraseptal emphysema (HCC)   [place] [facility] [place], Corrie Dandy T, NP   5 months ago Paraseptal emphysema (HCC)   [place] [facility] [place], Dorie Rank, NP   8 months ago Paraseptal emphysema [facility])   [place] [facility] [place], Dorie Rank, NP   11 months ago Paraseptal emphysema [facility])   [place] [facility] [place], Corrie Dandy T, NP   1 year ago Paraseptal emphysema [facility])   [place] [facility] [place], Dorie Rank, NP       Future Appointments             In 3 weeks [name], Dorie Rank, NP [facility]   In 3 weeks [place], Lavonna Rua, NP  [facility] [place]   In 10 months Stoioff, Verna Czech, MD [facility] [place]

## 2023-06-23 ENCOUNTER — Telehealth: Payer: Self-pay

## 2023-06-23 NOTE — Telephone Encounter (Signed)
PA for [facility] initiated and submitted via Cover My Meds. Key: [id number]

## 2023-06-24 NOTE — Telephone Encounter (Signed)
 PA approved. Called and notified patient of approval.  

## 2023-07-06 NOTE — Patient Instructions (Signed)

## 2023-07-11 ENCOUNTER — Encounter: Payer: Self-pay | Admitting: Nurse Practitioner

## 2023-07-11 ENCOUNTER — Telehealth (INDEPENDENT_AMBULATORY_CARE_PROVIDER_SITE_OTHER): Payer: Self-pay | Admitting: Nurse Practitioner

## 2023-07-11 DIAGNOSIS — F4312 Post-traumatic stress disorder, chronic: Secondary | ICD-10-CM

## 2023-07-11 DIAGNOSIS — K219 Gastro-esophageal reflux disease without esophagitis: Secondary | ICD-10-CM

## 2023-07-11 DIAGNOSIS — R42 Dizziness and giddiness: Secondary | ICD-10-CM

## 2023-07-11 DIAGNOSIS — E782 Mixed hyperlipidemia: Secondary | ICD-10-CM

## 2023-07-11 DIAGNOSIS — J438 Other emphysema: Secondary | ICD-10-CM

## 2023-07-11 DIAGNOSIS — F209 Schizophrenia, unspecified: Secondary | ICD-10-CM

## 2023-07-11 DIAGNOSIS — N401 Enlarged prostate with lower urinary tract symptoms: Secondary | ICD-10-CM

## 2023-07-11 DIAGNOSIS — I10 Essential (primary) hypertension: Secondary | ICD-10-CM

## 2023-07-11 DIAGNOSIS — F10288 Alcohol dependence with other alcohol-induced disorder: Secondary | ICD-10-CM

## 2023-07-11 DIAGNOSIS — I739 Peripheral vascular disease, unspecified: Secondary | ICD-10-CM

## 2023-07-11 DIAGNOSIS — R351 Nocturia: Secondary | ICD-10-CM

## 2023-07-11 DIAGNOSIS — F17210 Nicotine dependence, cigarettes, uncomplicated: Secondary | ICD-10-CM

## 2023-07-11 NOTE — Progress Notes (Signed)
Appointment scheduled.

## 2023-07-11 NOTE — Progress Notes (Signed)
Did not attend visit, could not get on video.  Will have reschedule.

## 2023-07-15 ENCOUNTER — Encounter: Payer: Self-pay | Admitting: Cardiology

## 2023-07-15 ENCOUNTER — Ambulatory Visit: Payer: MEDICAID | Attending: Cardiology | Admitting: Cardiology

## 2023-07-15 VITALS — BP 130/80 | HR 81 | Ht 70.0 in | Wt 185.6 lb

## 2023-07-15 DIAGNOSIS — E782 Mixed hyperlipidemia: Secondary | ICD-10-CM

## 2023-07-15 DIAGNOSIS — I6523 Occlusion and stenosis of bilateral carotid arteries: Secondary | ICD-10-CM

## 2023-07-15 DIAGNOSIS — I739 Peripheral vascular disease, unspecified: Secondary | ICD-10-CM

## 2023-07-15 DIAGNOSIS — R002 Palpitations: Secondary | ICD-10-CM

## 2023-07-15 DIAGNOSIS — F10288 Alcohol dependence with other alcohol-induced disorder: Secondary | ICD-10-CM

## 2023-07-15 DIAGNOSIS — R0602 Shortness of breath: Secondary | ICD-10-CM

## 2023-07-15 DIAGNOSIS — R072 Precordial pain: Secondary | ICD-10-CM | POA: Diagnosis not present

## 2023-07-15 DIAGNOSIS — I10 Essential (primary) hypertension: Secondary | ICD-10-CM

## 2023-07-15 DIAGNOSIS — F17210 Nicotine dependence, cigarettes, uncomplicated: Secondary | ICD-10-CM

## 2023-07-15 MED ORDER — ROSUVASTATIN CALCIUM 5 MG PO TABS: 5.0000 mg | ORAL_TABLET | ORAL | 0 refills | Status: DC

## 2023-07-15 NOTE — Patient Instructions (Signed)
Medication Instructions:   Stop Taking: Atorvastatin (Lipitor)  Start Taking: Rosuvastatin (Crestor) 5 mg three times a week on  Monday, Wednesday, Friday   *If you need a refill on your cardiac medications before your next appointment, please call your pharmacy*   Lab Work:  If you have labs (blood work) drawn today and your tests are completely normal, you will receive your results only by: MyChart Message (if you have MyChart) OR A paper copy in the mail If you have any lab test that is abnormal or we need to change your treatment, we will call you to review the results.   Testing/Procedures: Your physician has requested that you have an echocardiogram. Echocardiography is a painless test that uses sound waves to create images of your heart. It provides your doctor with information about the size and shape of your heart and how well your heart's chambers and valves are working.   You may receive an ultrasound enhancing agent through an IV if needed to better visualize your heart during the echo. This procedure takes approximately one hour.  There are no restrictions for this procedure.  This will take place at [address], [place] [address]     Your provider has ordered a Lexiscan/ Exercise Myoview Stress test. This will take place at [facility]. Please report to the [facility] medical mall entrance. The volunteers at the first desk will direct you where to go.   [facility] MYOVIEW  Your provider has ordered a Stress Test with nuclear imaging. The purpose of this test is to evaluate the blood supply to your heart muscle. This procedure is referred to as a "Non-Invasive Stress Test." This is because other than having an IV started in your vein, nothing is inserted or "invades" your body. Cardiac stress tests are done to find areas of poor blood flow to the heart by determining the extent of coronary artery disease (CAD). Some patients exercise on a treadmill,  which naturally increases the blood flow to your heart, while others who are unable to walk on a treadmill due to physical limitations will have a pharmacologic/chemical stress agent called Lexiscan . This medicine will mimic walking on a treadmill by temporarily increasing your coronary blood flow.   Please note: these test may take anywhere between 2-4 hours to complete  How to prepare for your Myoview test:  Nothing to eat for 6 hours prior to the test No caffeine for 24 hours prior to test No smoking 24 hours prior to test. Your medication may be taken with water.  If your doctor stopped a medication because of this test, do not take that medication. Ladies, please do not wear dresses.  Skirts or pants are appropriate. Please wear a short sleeve shirt. No perfume, cologne or lotion. Wear comfortable walking shoes. No heels!   PLEASE NOTIFY THE OFFICE AT LEAST 24 HOURS IN ADVANCE IF YOU ARE UNABLE TO KEEP YOUR APPOINTMENT.  [phone] AND  PLEASE NOTIFY NUCLEAR MEDICINE AT [facility] AT LEAST 24 HOURS IN ADVANCE IF YOU ARE UNABLE TO KEEP YOUR APPOINTMENT. [phone]    Follow-Up: At [facility], you and your health needs are our priority.  As part of our continuing mission to provide you with exceptional heart care, we have created designated Provider Care Teams.  These Care Teams include your primary Cardiologist (physician) and Advanced Practice Providers (APPs -  Physician Assistants and Nurse Practitioners) who all work together to provide you with the care you  need, when you need it.  We recommend signing up for the patient portal called "MyChart".  Sign up information is provided on this After Visit Summary.  MyChart is used to connect with patients for Virtual Visits (Telemedicine).  Patients are able to view lab/test results, encounter notes, upcoming appointments, etc.  Non-urgent messages can be sent to your provider as well.   To learn more about what you can do with  MyChart, go to [email or web address].    Your next appointment:   Follow up after test  Provider:   Charlsie Quest, NP

## 2023-07-15 NOTE — Progress Notes (Signed)
Cardiology Office Note:  .   Date:  07/15/2023  ID:  Luke Boatman., DOB [birth year], MRN [id number] PCP: Luke Skiff, NP  [facility] Providers Cardiologist:  None    History of Present Illness: .   Luke Glen. is a 62 y.o. male with a past medical history of aortic atherosclerosis, carotid stenosis followed by [facility], essential hypertension, GERD, tobacco abuse, heavy Etoh abuse,osteoarthritis, BPH, chronic pain syndrome, schizophrenia, mixed hyperlipidemia, who is here today for follow-up.  Echocardiogram completed 10/2019 showed normal systolic and diastolic function.  Lexiscan Myoview 01/24/2021 showed no evidence of ischemia.  He was last seen in clinic 04/05/2021.  At that time he had complaints of shortness of breath and swelling in his ankles.  He had stopped taking all of his medications as stated he only wanted to take clonidine at night.  At the time the patient stated he felt better since stopping all of his blood pressure medications.  He continued to smoke and heavy alcohol use.  He was continued on clonidine 0.2 mg at bedtime for his blood pressure with tobacco and alcohol cessation encouraged.  Since his last visit he has undergone surgery ACDF C5-6 with neurosurgery.  Reported back to the hospital for postoperative complaint.  And then was evaluated by urgent care for mechanical falls.Was found to be COVID positive 06/12/23.  He returns to clinic today with complaints of night sweats, palpitations, shortness of breath, fatigue, and chest pain/tightness. He states that he sustained a fall and landed on the left side of his chest and has ongoing chest tightness since that time. He was evaluated and told he had a contusion. The SOB has gradually been worsening over the last year with increasing fatigue. He states that his palpitations have increased since his fall and COVID infection. He states that he had right hip pain, thigh pain, and bilateral calf pain that is  not limiting. He continues to smoke 1.5 ppd and drinks 12-18 beers daily. During his ED visit he had an elevated d-dimer which they wanted to complete a CTA chest to rule out a PE. Patient stated that was being managed through his PCP's office.  ROS: 10 point review of systems has been reviewed and considered negative with exception of what is been listed in the HPI  Studies Reviewed: [name]   EKG Interpretation Date/Time:  Tuesday July 15 2023 08:33:12 EDT Ventricular Rate:  81 PR Interval:  142 QRS Duration:  76 QT Interval:  360 QTC Calculation: 418 R Axis:   10  Text Interpretation: Normal sinus rhythm Normal ECG When compared with ECG of 13-Jun-2023 08:08, No significant change was found Confirmed by Charlsie Quest ([id number]) on 07/15/2023 11:42:09 AM   Lexiscan MPI 01/24/2021 Narrative & Impression  T wave inversion was noted during stress in the V4, V5, V6 and II leads. There was no ST segment deviation noted during stress. The study is normal. This is a low risk study. The left ventricular ejection fraction is normal (55-65%). CT attenuation images showed no significant aortic or coronary calcifications.    This result has not been signed. Information might be incomplete.   TTE 11/18/2019 1. Left ventricular ejection fraction, by visual estimation, is 60 to  65%. The left ventricle has normal function. There is no left ventricular  hypertrophy.   2. The left ventricle has no regional wall motion abnormalities.   3. Global right ventricle has normal systolic function.The right  ventricular size is normal. No  increase in right ventricular wall  thickness.   4. Left atrial size was normal.   5. Right atrial size was normal.   6. The mitral valve is normal in structure. No evidence of mitral valve  regurgitation. No evidence of mitral stenosis.   7. The tricuspid valve is normal in structure.   8. The aortic valve was not well visualized. Aortic valve regurgitation  is not  visualized. No evidence of aortic valve sclerosis or stenosis.   9. The pulmonic valve was not well visualized. Pulmonic valve  regurgitation is not visualized.  10. Normal pulmonary artery systolic pressure.  11. The inferior vena cava is normal in size with greater than 50%  respiratory variability, suggesting right atrial pressure of 3 mmHg.  Risk Assessment/Calculations:             Physical Exam:   VS:  BP 130/80 (BP Location: Left Arm, Patient Position: Sitting, Cuff Size: Normal)   Pulse 81   Ht 5\' 10"  (1.778 m)   Wt 185 lb 9.6 oz (84.2 kg)   SpO2 93%   BMI 26.63 kg/m    Wt Readings from Last 3 Encounters:  07/15/23 185 lb 9.6 oz (84.2 kg)  06/13/23 200 lb (90.7 kg)  06/09/23 200 lb (90.7 kg)    GEN: Well nourished, well developed in no acute distress NECK: No JVD; No carotid bruits CARDIAC: RRR, no murmurs, rubs, gallops RESPIRATORY:  Clear to auscultation without rales, wheezing or rhonchi  ABDOMEN: Soft, non-tender, non-distended EXTREMITIES:  No edema; No deformity   ASSESSMENT AND PLAN: .   Precordial chest pain and chest tightness for the last 4-6 weeks. He had a mechanical fall landing on his left chest, was evaluated in the [facility] with a negative troponin. Work-up was unrevealing in the ED. Continued to have chest tightness and pressure with associated SOB and fatigue. EKG today showed sinus with no ischemic changes. He is being scheduled for a [employer].  Shortness of breath that he states is been worsening over the last year with associated chest tightness and chest pain.  Previous echocardiogram completed in 2020 with an LVEF of 60 to 65%, no regional wall motion abnormalities and no LVH.  Recently was in the emergency department with a positive D-dimer a month prior without CT of the chest rule out PE.  Patient states his PCP was notified to see if he can have the scan done sooner versus while he was in the emergency department.  He has been scheduled for  repeat echocardiogram to evaluate function and or strain.  Palpitations that he notices more with his shortness of breath.  He has been encouraged to decrease his caffeine and alcohol intake.  If his Myoview and echocardiogram remain unrevealing can consider placing him on a ZIO XT monitor at home return.  Primary hypertension with blood pressure today 130/80.  Patient is continued on olmesartan HCTZ 40/25 mg daily.  Mixed hyperlipidemia with an LDL of 100.  He had previously been on atorvastatin 10 mg daily stating that he had stopped taking this medication approximately 3 months prior.  He states that he had muscle aches and muscle pains from the atorvastatin.  After further discussion he is being started on rosuvastatin 5 mg 3 times a week with increasing up to daily with a repeat lipid and hepatic panel in 10 to 12 weeks.  Peripheral arterial disease with bilateral carotid stenosis (and continues to be followed by [facility].  He will be started on  statin therapy today.  His carotid duplex revealed right ICA with 1-39% stenosis in left ICA consistent with 40-59% stenosis.  Tobacco and alcohol abuse where he has no desire to quit either at this time.  Did advise him the increased risk of heart attack or stroke as well as alcohol and decreasing heart function.  Total cessation is recommended.    Informed Consent   Shared Decision Making/Informed Consent The risks [chest pain, shortness of breath, cardiac arrhythmias, dizziness, blood pressure fluctuations, myocardial infarction, stroke/transient ischemic attack, nausea, vomiting, allergic reaction, radiation exposure, metallic taste sensation and life-threatening complications (estimated to be 1 in 10,000)], benefits (risk stratification, diagnosing coronary artery disease, treatment guidance) and alternatives of a nuclear stress test were discussed in detail with Luke Briggs and he agrees to proceed.     Dispo: Patient to return to clinic to see MD/APP  after testing is completed or sooner if needed to reevaluate symptoms.  Signed, Aireona Torelli, NP

## 2023-07-21 NOTE — Patient Instructions (Signed)
Eating Plan for Chronic Obstructive Pulmonary Disease Chronic obstructive pulmonary disease (COPD) causes symptoms such as shortness of breath, coughing, and chest discomfort. These symptoms can make it difficult to eat enough to maintain a healthy weight. Generally, people with COPD should eat a diet that is high in calories, protein, and other nutrients to maintain body weight and to keep the lungs as healthy as possible. Depending on the medicines you take and other health conditions you may have, your health care provider may give you additional recommendations on what to eat or avoid. Talk with your health care provider about your goals for body weight, and work with a dietitian to develop an eating plan that is right for you. What are tips for following this plan? Reading food labels  Avoid foods with more than 300 milligrams (mg) of salt (sodium) per serving. Choose foods that contain at least 4 grams (g) of fiber per serving. Try to eat 20-30 g of fiber each day. Choose foods that are high in calories and protein, such as nuts, beans, yogurt, and cheese. Shopping Do not buy foods labeled as diet, low-calorie, or low-fat. If you are able to eat dairy products: Avoid low-fat or skim milk. Buy dairy products that have at least 2% fat. Buy nutritional supplement drinks. Buy grains and prepared foods labeled as enriched or fortified. Consider buying low-sodium, pre-made foods to conserve energy for eating. Cooking Add dry milk or protein powder to smoothies. Cook with healthy fats, such as olive oil, canola oil, sunflower oil, and grapeseed oil. Add oil, butter, cream cheese, or nut butters to foods to increase fat and calories. To make foods easier to chew and swallow: Cook vegetables, pasta, and rice until soft. Cut or grind meat into very small pieces. Dip breads in liquid. Meal planning  Eat when you feel hungry. Eat 5-6 small meals throughout the day. Drink 6-8 glasses of water  each day. Do not drink liquids with meals. Drink liquids at the end of the meal to avoid feeling full too quickly. Eat a variety of fruits and vegetables every day. Ask for assistance from family or friends with planning and preparing meals as needed. Avoid foods that cause you to feel bloated, such as carbonated drinks, fried foods, beans, broccoli, cabbage, and apples. For older adults, ask your local agency on aging whether you are eligible for meal assistance programs, such as Meals on Wheels. Lifestyle  Do not smoke. Eat slowly. Take small bites and chew food well before swallowing. Do not overeat. This may make it more difficult to breathe after eating. Sit up while eating. If needed, continue to use supplemental oxygen while eating. Rest or relax for 30 minutes before and after eating. Monitor your weight as told by your health care provider. Exercise as told by your health care provider. What foods should I eat? Fruits All fresh, dried, canned, or frozen fruits that do not cause gas. Vegetables All fresh, canned (no salt added), or frozen vegetables that do not cause gas. Grains Whole-grain bread. Enriched whole-grain pasta. Fortified whole-grain cereals. Fortified rice. Quinoa. Meats and other proteins Lean meat. Poultry. Fish. Dried beans. Unsalted nuts. Tofu. Eggs. Nut butters. Dairy Whole or 2% milk. Cheese. Yogurt. Fats and oils Olive oil. Canola oil. Butter. Margarine. Beverages Water. Vegetable juice (no salt added). Decaffeinated coffee. Decaffeinated or herbal tea. Seasonings and condiments Fresh or dried herbs. Low-salt or salt-free seasonings. Low-sodium soy sauce. The items listed above may not be a complete list of foods   and beverages you can eat. Contact a dietitian for more information. What foods should I avoid? Fruits Fruits that cause gas, such as apples or melon. Vegetables Vegetables that cause gas, such as broccoli, Brussels sprouts, cabbage,  cauliflower, and onions. Canned vegetables with added salt. Meats and other proteins Fried meat. Salt-cured meat. Processed meat. Dairy Fat-free or low-fat milk, yogurt, or cheese. Processed cheese. Beverages Carbonated drinks. Caffeinated drinks, such as coffee, tea, and soft drinks. Juice. Alcohol. Vegetable juice with added salt. Seasonings and condiments Salt. Seasoning mixes with salt. Soy sauce. Pickles. Other foods Clear soup or broth. Fried foods. Prepared frozen meals. The items listed above may not be a complete list of foods and beverages you should avoid. Contact a dietitian for more information. Summary COPD symptoms can make it difficult to eat enough to maintain a healthy weight. A COPD eating plan can help you maintain your body weight and keep your lungs as healthy as possible. Eat a diet that is high in calories, protein, and other nutrients. Read labels to make sure that you are getting the right nutrients. Cook foods to make them easier to chew and swallow. Eat 5-6 small meals throughout the day, and avoid foods that cause gas or make you feel bloated. This information is not intended to replace advice given to you by your health care provider. Make sure you discuss any questions you have with your health care provider. Document Revised: 09/12/2020 Document Reviewed: 09/12/2020 Elsevier Patient Education  2024 [employer]  

## 2023-07-25 ENCOUNTER — Ambulatory Visit (INDEPENDENT_AMBULATORY_CARE_PROVIDER_SITE_OTHER): Payer: MEDICAID | Admitting: Nurse Practitioner

## 2023-07-25 ENCOUNTER — Encounter: Payer: Self-pay | Admitting: Nurse Practitioner

## 2023-07-25 VITALS — BP 138/80 | HR 77 | Temp 97.7°F | Ht 70.0 in | Wt 188.0 lb

## 2023-07-25 DIAGNOSIS — R21 Rash and other nonspecific skin eruption: Secondary | ICD-10-CM

## 2023-07-25 DIAGNOSIS — J438 Other emphysema: Secondary | ICD-10-CM

## 2023-07-25 DIAGNOSIS — F209 Schizophrenia, unspecified: Secondary | ICD-10-CM

## 2023-07-25 DIAGNOSIS — F10288 Alcohol dependence with other alcohol-induced disorder: Secondary | ICD-10-CM

## 2023-07-25 DIAGNOSIS — Z23 Encounter for immunization: Secondary | ICD-10-CM

## 2023-07-25 DIAGNOSIS — F4312 Post-traumatic stress disorder, chronic: Secondary | ICD-10-CM

## 2023-07-25 DIAGNOSIS — E782 Mixed hyperlipidemia: Secondary | ICD-10-CM

## 2023-07-25 DIAGNOSIS — R7989 Other specified abnormal findings of blood chemistry: Secondary | ICD-10-CM | POA: Insufficient documentation

## 2023-07-25 DIAGNOSIS — Z8673 Personal history of transient ischemic attack (TIA), and cerebral infarction without residual deficits: Secondary | ICD-10-CM

## 2023-07-25 DIAGNOSIS — K219 Gastro-esophageal reflux disease without esophagitis: Secondary | ICD-10-CM

## 2023-07-25 DIAGNOSIS — I739 Peripheral vascular disease, unspecified: Secondary | ICD-10-CM

## 2023-07-25 DIAGNOSIS — F17210 Nicotine dependence, cigarettes, uncomplicated: Secondary | ICD-10-CM

## 2023-07-25 DIAGNOSIS — I10 Essential (primary) hypertension: Secondary | ICD-10-CM

## 2023-07-25 MED ORDER — PREDNISONE 10 MG PO TABS: ORAL_TABLET | ORAL | 0 refills | Status: DC

## 2023-07-25 MED ORDER — PREDNISONE 50 MG PO TABS: ORAL_TABLET | ORAL | 0 refills | Status: DC

## 2023-07-25 MED ORDER — DIPHENHYDRAMINE HCL 50 MG PO TABS: 50.0000 mg | ORAL_TABLET | Freq: Every evening | ORAL | 0 refills | Status: AC | PRN

## 2023-07-25 MED ORDER — TRIAMCINOLONE ACETONIDE 0.1 % EX CREA: 1.0000 | TOPICAL_CREAM | Freq: Two times a day (BID) | CUTANEOUS | 2 refills | Status: AC

## 2023-07-25 MED ORDER — DOXYCYCLINE HYCLATE 100 MG PO TABS: 100.0000 mg | ORAL_TABLET | Freq: Two times a day (BID) | ORAL | 0 refills | Status: AC

## 2023-07-25 NOTE — Assessment & Plan Note (Signed)
Chronic, ongoing.  Followed by vascular, recent notes reviewed.

## 2023-07-25 NOTE — Assessment & Plan Note (Signed)
Recommend cutting back -- concern for overall health with ongoing heavy use and suspect major reason for his overall feeling bad.  Is aware of effect of heavy alcohol use on overall health, especially BP.   Check labs today to include CMP, CBC, MAG.  He refuses rehab, AA, or medications.

## 2023-07-25 NOTE — Assessment & Plan Note (Signed)
Chronic, ongoing.  BP initially elevated, but is trending down with recheck -- currently not feeling well.  Continue to recommend reduction to cessation of alcohol use, however he continues to drink heavily.  Recommend cutting back on alcohol and smoking -- work towards complete cessation.  Recommend he monitor BP at least a few mornings a week at home and document.  DASH diet at home.  Labs today: CBC, TSH, and CMP.

## 2023-07-25 NOTE — Assessment & Plan Note (Addendum)
Chronic, ongoing.  Continue to collaborate with pulmonary, recommend he continue to attend scheduled visits with them and if cardiac work-up reassuring recommend he return to pulmonary for further work-up for SOB.  Continue Breztri BID and Albuterol as needed at this time.  Continue yearly lung screening.  Recommend complete cessation of smoking. Have ordered CT angiogram due to positive D Dimer in ER, this was recommended.  Discussed with patient.  Have sent in premedication to take for this due to allergy to contrast (Prednisone and Benadryl). - Current ongoing cough post Covid, will obtain imaging and start Prednisone and Doxycycline

## 2023-07-25 NOTE — Assessment & Plan Note (Signed)
History of infarct, continue collaboration with neuro and ENT for ongoing tinnitus, headaches, and dizziness.  Recent MRI remains stable.  Referral to neurology placed last visit and has not attended., as would benefit follow-up with them.

## 2023-07-25 NOTE — Assessment & Plan Note (Signed)
Chronic, ongoing.  Continue current medication regimen and adjust as needed. Lipid panel today. 

## 2023-07-25 NOTE — Assessment & Plan Note (Signed)
Have ordered CT angiogram due to positive D Dimer in ER, this was recommended.  Discussed with patient.  Have sent in premedication to take for this due to allergy to contrast (Prednisone and Benadryl).

## 2023-07-25 NOTE — Assessment & Plan Note (Signed)
I have recommended complete cessation of tobacco use. I have discussed various options available for assistance with tobacco cessation including over the counter methods (Nicotine gum, patch and lozenges). We also discussed prescription options (Chantix, Nicotine Inhaler / Nasal Spray). The patient is not interested in pursuing any prescription tobacco cessation options at this time.  Continue yearly lung CA screening.

## 2023-07-25 NOTE — Progress Notes (Signed)
BP 138/80 (BP Location: Left Arm, Patient Position: Sitting, Cuff Size: Normal)   Pulse 77   Temp 97.7 F (36.5 C) (Oral)   Ht 5\' 10"  (1.778 m)   Wt 188 lb (85.3 kg)   SpO2 98%   BMI 26.98 kg/m    Subjective:    Patient ID: Luke Boatman., male    DOB: 1961/05/07, 62 y.o.   MRN: [id number]  HPI: Luke Ruane. is a 62 y.o. male  Chief Complaint  Patient presents with   COPD   Depression   Hyperlipidemia   Hypertension   COPD Continues Breztri BID and Albuterol as needed.  Was following with pulmonary and saw last 09/23/22. Reports he will probably not return as they tried to stick him in little machine to test lungs and he is not going to do it -- made him to nervous.   Last illness was Covid in July 2024.  Taking Montelukast.  Had last lung screening on 10/24/22 -- continues to note emphysema and aortic atherosclerosis.    Continues to smoke 1 to 1 1/2 PPD, has been smoking for >45 years.  Not interested in quitting.   Satisfied with current treatment?: yes Oxygen use: no Dyspnea frequency: yes -- all the time Cough frequency: none Rescue inhaler frequency:  once a week Limitation of activity: no Productive cough: none Last Spirometry: with pulmonary Pneumovax: Up To Date Influenza: Up to Date  HYPERTENSION / HYPERLIPIDEMIA Continues on Benicar 40-25 dosing.  Last saw cardiology 07/15/23 -- going for stress test on Monday due to palpitations. Recently had Covid and was seen in ER, an elevated D Dimer was noted and it was recommended he obtain CT angiogram, but he is allergic to contrast and declined this, even with premedication.    Continues to drink alcohol nightly -- 6-8 [employer] a day -- has tried cutting back. Continues to report drinking helps his tinnitus.  States he feels worse if he does not drink.  About 4 beers and his ears stop ringing and he has more energy. Satisfied with current treatment? yes Duration of hypertension: chronic BP  monitoring frequency: not checking BP range:  BP medication side effects: no Duration of hyperlipidemia: chronic Cholesterol medication side effects: no Cholesterol supplements: none Medication compliance: good compliance Aspirin: no Recent stressors: no Recurrent headaches: no Visual changes: no Palpitations: yes Dyspnea: yes, ongoing at baseline Chest pain: no Lower extremity edema: no Dizzy/lightheaded: yes  The 10-year ASCVD risk score (Arnett DK, et al., 2019) is: 15.1%   Values used to calculate the score:     Age: 89 years     Sex: Male     Is Non-Hispanic African American: No     Diabetic: No     Tobacco smoker: Yes     Systolic Blood Pressure: 138 mmHg     Is BP treated: Yes     HDL Cholesterol: 61 mg/dL     Total Cholesterol: 192 mg/dL  DIZZINESS/NUMBNESS/FATIGUE Chronic issues with fatigue/numbness, tinnitus, and dizziness -- has had lengthy work-up.  Known chronic right basal ganglia lacunar infarct on imaging and continues to complain of ongoing tinnitus, fatigue, numbness, and intermittent dizziness -- sees ENT & neuro for this as needed -- have recommended he return for visits, but he has not as of yet. Last MRI brain on 07/29/22 showed no acute changes and small chronic lacunar infarct.    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 Duration:  chronic Severity: 7/10  Onset: gradual Context when symptoms started:  unknown Symptoms improve with rest: no  Depressive symptoms: yes Stress/anxiety: no Insomnia: yes hard to fall asleep Snoring: no Observed apnea by bed partner: no Daytime hypersomnolence:yes Wakes feeling refreshed: no History of sleep study: no Dysnea on exertion:  yes - at baseline Orthopnea/PND: no Chest pain: no Chronic cough: no Lower extremity edema: no Arthralgias:no Myalgias: no Weakness: yes at baseline Rash: no   SCHIZOPHRENIA No  current medications.  Mood status: stable Satisfied with current treatment?: yes Symptom severity:mild Psychotherapy/counseling: in past Previous psychiatric medications: multiple in past Depressed mood: no Anxious mood: no Anhedonia: no Significant weight loss or gain: no Insomnia: yes hard to fall asleep Fatigue: no Feelings of worthlessness or guilt: no Impaired concentration/indecisiveness: no Suicidal ideations: no Hopelessness: no Crying spells: no    07/25/2023    9:24 AM 07/11/2023    8:52 AM 04/10/2023    8:18 AM 01/10/2023    9:47 AM 10/03/2022    8:55 AM  Depression screen PHQ 2/9  Decreased Interest 3 0 1 0 2  Down, Depressed, Hopeless 3 0 0 0 3  PHQ - 2 Score 6 0 1 0 5  Altered sleeping 3 0 1 0 3  Tired, decreased energy 3 3 3  0 3  Change in appetite 1 0 1 0 2  Feeling bad or failure about yourself  2 0 0 0 3  Trouble concentrating 3 2 2  0 3  Moving slowly or fidgety/restless 3 0 1 0 2  Suicidal thoughts 0 0 0 0 0  PHQ-9 Score 21 5 9  0 21  Difficult doing work/chores Very difficult Not difficult at all Very difficult Not difficult at all Extremely dIfficult       07/25/2023    9:24 AM 07/11/2023    8:53 AM 04/10/2023    8:18 AM 01/10/2023    9:47 AM  GAD 7 : Generalized Anxiety Score  Nervous, Anxious, on Edge 2 0 2 0  Control/stop worrying 2 0 0 0  Worry too much - different things 2 0 0 0  Trouble relaxing 3 2 2  0  Restless 2 0 0 0  Easily annoyed or irritable 3 3 3  0  Afraid - awful might happen 3 0 2 0  Total GAD 7 Score 17 5 9  0  Anxiety Difficulty Very difficult Not difficult at all Very difficult Not difficult at all   Relevant past medical, surgical, family and social history reviewed and updated as indicated. Interim medical history since our last visit reviewed. Allergies and medications reviewed and updated.  Review of Systems  Constitutional:  Positive for fatigue. Negative for activity change, chills, diaphoresis and fever.  Respiratory:   Positive for cough, shortness of breath and wheezing. Negative for chest tightness.   Cardiovascular:  Positive for palpitations. Negative for chest pain and leg swelling.  Gastrointestinal: Negative.   Endocrine: Negative.   Neurological:  Positive for dizziness. Negative for syncope, weakness, light-headedness, numbness and headaches.  Psychiatric/Behavioral: Negative.     Per HPI unless specifically indicated above     Objective:    BP 138/80 (BP Location: Left Arm, Patient Position: Sitting, Cuff Size: Normal)   Pulse 77   Temp 97.7 F (36.5 C) (Oral)   Ht 5\' 10"  (1.778 m)   Wt 188 lb (85.3 kg)   SpO2 98%   BMI 26.98 kg/m  Wt Readings from Last 3 Encounters:  07/25/23 188 lb (85.3 kg)  07/15/23 185 lb 9.6 oz (84.2 kg)  06/13/23 200 lb (90.7 kg)    Physical Exam Vitals and nursing note reviewed.  Constitutional:      General: He is awake. He is not in acute distress.    Appearance: He is well-developed and well-groomed. He is not ill-appearing or toxic-appearing.  HENT:     Head: Normocephalic and atraumatic.     Right Ear: Hearing normal. No drainage.     Left Ear: Hearing normal. No drainage.  Eyes:     General: Lids are normal.        Right eye: No discharge.        Left eye: No discharge.     Conjunctiva/sclera: Conjunctivae normal.     Pupils: Pupils are equal, round, and reactive to light.     Visual Fields: Right eye visual fields normal and left eye visual fields normal.  Neck:     Thyroid: No thyromegaly.     Vascular: No carotid bruit.  Cardiovascular:     Rate and Rhythm: Normal rate and regular rhythm.     Heart sounds: Normal heart sounds, S1 normal and S2 normal. No murmur heard.    No gallop.  Pulmonary:     Effort: Pulmonary effort is normal. No accessory muscle usage or respiratory distress.     Breath sounds: Wheezing and rhonchi present. No decreased breath sounds.     Comments: Scattered expiratory wheezes throughout and rhonchi noted to  lower lobes.  Productive cough present. Abdominal:     General: Bowel sounds are normal. There is no distension.     Palpations: Abdomen is soft. There is no hepatomegaly.     Tenderness: There is no abdominal tenderness.     Hernia: No hernia is present.  Musculoskeletal:        General: Normal range of motion.     Cervical back: Normal range of motion and neck supple.     Right lower leg: No edema.     Left lower leg: No edema.  Skin:    General: Skin is warm and dry.     Capillary Refill: Capillary refill takes less than 2 seconds.     Findings: Rash present. Rash is urticarial.     Comments: Scattered rash to back and extremities.  Mild erythema, red patches with slight scaling.  Neurological:     Mental Status: He is alert and oriented to person, place, and time.     Cranial Nerves: Cranial nerves 2-12 are intact.     Sensory: Sensation is intact.     Motor: Motor function is intact.     Coordination: Coordination is intact.     Gait: Gait is intact.     Deep Tendon Reflexes: Reflexes are normal and symmetric.     Reflex Scores:      Brachioradialis reflexes are 2+ on the right side and 2+ on the left side.      Patellar reflexes are 2+ on the right side and 2+ on the left side. Psychiatric:        Attention and Perception: Attention normal.        Mood and Affect: Mood normal.        Speech: Speech normal.        Behavior: Behavior normal. Behavior is cooperative.        Thought Content: Thought content normal.    Results for orders placed  or performed during the hospital encounter of 06/13/23  SARS Coronavirus 2 by RT PCR (hospital order, performed in [facility] hospital lab) *cepheid single result test* Anterior Nasal Swab   Specimen: Anterior Nasal Swab  Result Value Ref Range   SARS Coronavirus 2 by RT PCR POSITIVE (A) NEGATIVE  CBC with Differential  Result Value Ref Range   WBC 7.5 4.0 - 10.5 K/uL   RBC 4.86 4.22 - 5.81 MIL/uL   Hemoglobin 15.0 13.0 - 17.0  g/dL   HCT 28.4 13.2 - 44.0 %   MCV 86.0 80.0 - 100.0 fL   MCH 30.9 26.0 - 34.0 pg   MCHC 35.9 30.0 - 36.0 g/dL   RDW 10.2 72.5 - 36.6 %   Platelets 301 150 - 400 K/uL   nRBC 0.0 0.0 - 0.2 %   Neutrophils Relative % 67 %   Neutro Abs 5.0 1.7 - 7.7 K/uL   Lymphocytes Relative 15 %   Lymphs Abs 1.1 0.7 - 4.0 K/uL   Monocytes Relative 18 %   Monocytes Absolute 1.4 (H) 0.1 - 1.0 K/uL   Eosinophils Relative 0 %   Eosinophils Absolute 0.0 0.0 - 0.5 K/uL   Basophils Relative 0 %   Basophils Absolute 0.0 0.0 - 0.1 K/uL   Immature Granulocytes 0 %   Abs Immature Granulocytes 0.03 0.00 - 0.07 K/uL  Basic metabolic panel  Result Value Ref Range   Sodium 124 (L) 135 - 145 mmol/L   Potassium 3.9 3.5 - 5.1 mmol/L   Chloride 86 (L) 98 - 111 mmol/L   CO2 25 22 - 32 mmol/L   Glucose, Bld 100 (H) 70 - 99 mg/dL   BUN 10 6 - 20 mg/dL   Creatinine, Ser 4.40 0.61 - 1.24 mg/dL   Calcium 9.0 8.9 - 34.7 mg/dL   GFR, Estimated >42 >59 mL/min   Anion gap 13 5 - 15  D-dimer, quantitative  Result Value Ref Range   D-Dimer, Quant 0.76 (H) 0.00 - 0.50 ug/mL-FEU  Troponin I (High Sensitivity)  Result Value Ref Range   Troponin I (High Sensitivity) 4 <18 ng/L      Assessment & Plan:   Problem List Items Addressed This Visit       Cardiovascular and Mediastinum   Essential hypertension    Chronic, ongoing.  BP initially elevated, but is trending down with recheck -- currently not feeling well.  Continue to recommend reduction to cessation of alcohol use, however he continues to drink heavily.  Recommend cutting back on alcohol and smoking -- work towards complete cessation.  Recommend he monitor BP at least a few mornings a week at home and document.  DASH diet at home.  Labs today: CBC, TSH, and CMP.       Relevant Orders   Comprehensive metabolic panel   TSH   PAD (peripheral artery disease) (HCC)    Chronic, ongoing.  Followed by vascular, recent notes reviewed.      Relevant Orders    Iron Binding Cap (TIBC)([facility])   Ferritin     Respiratory   Paraseptal emphysema (HCC) - Primary    Chronic, ongoing.  Continue to collaborate with pulmonary, recommend he continue to attend scheduled visits with them and if cardiac work-up reassuring recommend he return to pulmonary for further work-up for SOB.  Continue Breztri BID and Albuterol as needed at this time.  Continue yearly lung screening.  Recommend complete cessation of smoking. Have ordered CT angiogram due to positive D  Dimer in ER, this was recommended.  Discussed with patient.  Have sent in premedication to take for this due to allergy to contrast (Prednisone and Benadryl). - Current ongoing cough post Covid, will obtain imaging and start Prednisone and Doxycycline      Relevant Medications   predniSONE (DELTASONE) 10 MG tablet   predniSONE (DELTASONE) 50 MG tablet   diphenhydrAMINE (BENADRYL ALLERGY EXTRA STR) 50 MG tablet   Other Relevant Orders   CBC with Differential/Platelet   DG Chest 2 View   CT Angio Chest W/Cm &/Or Wo Cm     Other   Alcohol dependence (HCC)    Recommend cutting back -- concern for overall health with ongoing heavy use and suspect major reason for his overall feeling bad.  Is aware of effect of heavy alcohol use on overall health, especially BP.   Check labs today to include CMP, CBC, MAG.  He refuses rehab, AA, or medications.      Relevant Orders   Comprehensive metabolic panel   Magnesium   Chronic post-traumatic stress disorder (PTSD)    Refer to schizophrenia plan of care.      History of lacunar cerebrovascular accident    History of infarct, continue collaboration with neuro and ENT for ongoing tinnitus, headaches, and dizziness.  Recent MRI remains stable.  Referral to neurology placed last visit and has not attended., as would benefit follow-up with them.      Mixed hyperlipidemia    Chronic, ongoing.  Continue current medication regimen and adjust as needed.  Lipid  panel today.       Relevant Orders   Comprehensive metabolic panel   Lipid Panel w/o Chol/HDL Ratio   Nicotine dependence    I have recommended complete cessation of tobacco use. I have discussed various options available for assistance with tobacco cessation including over the counter methods (Nicotine gum, patch and lozenges). We also discussed prescription options (Chantix, Nicotine Inhaler / Nasal Spray). The patient is not interested in pursuing any prescription tobacco cessation options at this time.  Continue yearly lung CA screening.       Positive D dimer    Have ordered CT angiogram due to positive D Dimer in ER, this was recommended.  Discussed with patient.  Have sent in premedication to take for this due to allergy to contrast (Prednisone and Benadryl).      Relevant Orders   CT Angio Chest W/Cm &/Or Wo Cm   Schizophrenia (HCC)    Chronic, ongoing.  No current medications.  Tolerating this and does not wish to start medication.  Could consider Seroquel in future if needed or return to Clonidine.  Denies SI/HI.      Other Visit Diagnoses     Rash       Referral to dermatology for further recommendations.   Relevant Orders   Ambulatory referral to Dermatology   Need for influenza vaccination       Flu vaccine today, educated on this.   Relevant Orders   Flu vaccine trivalent PF, 6mos and older(Flulaval,Afluria,Fluarix,Fluzone) (Completed)        Follow up plan: Return in about 2 weeks (around 08/08/2023) for Cough Post Covid.

## 2023-07-25 NOTE — Assessment & Plan Note (Signed)
Refer to schizophrenia plan of care. 

## 2023-07-25 NOTE — Assessment & Plan Note (Signed)
Chronic, ongoing.  No current medications.  Tolerating this and does not wish to start medication.  Could consider Seroquel in future if needed or return to Clonidine.  Denies SI/HI.

## 2023-07-26 ENCOUNTER — Other Ambulatory Visit: Payer: Self-pay | Admitting: Nurse Practitioner

## 2023-07-26 DIAGNOSIS — E871 Hypo-osmolality and hyponatremia: Secondary | ICD-10-CM

## 2023-07-26 LAB — LIPID PANEL W/O CHOL/HDL RATIO
Cholesterol, Total: 185 mg/dL (ref 100–199)
HDL: 68 mg/dL (ref >39)
LDL Chol Calc (NIH): 99 mg/dL (ref 0–99)
Triglycerides: 98 mg/dL (ref 0–149)
VLDL Cholesterol Cal: 18 mg/dL (ref 5–40)

## 2023-07-26 LAB — CBC WITH DIFFERENTIAL/PLATELET
Basophils Absolute: 0 10*3/uL (ref 0.0–0.2)
Basos: 0 %
EOS (ABSOLUTE): 0.1 10*3/uL (ref 0.0–0.4)
Eos: 1 %
Hematocrit: 45.9 % (ref 37.5–51.0)
Hemoglobin: 15.6 g/dL (ref 13.0–17.7)
Immature Grans (Abs): 0 10*3/uL (ref 0.0–0.1)
Immature Granulocytes: 0 %
Lymphocytes Absolute: 1.7 10*3/uL (ref 0.7–3.1)
Lymphs: 18 %
MCH: 31.3 pg (ref 26.6–33.0)
MCHC: 34 g/dL (ref 31.5–35.7)
MCV: 92 fL (ref 79–97)
Monocytes Absolute: 0.8 10*3/uL (ref 0.1–0.9)
Monocytes: 8 %
Neutrophils Absolute: 6.9 10*3/uL (ref 1.4–7.0)
Neutrophils: 73 %
Platelets: 334 10*3/uL (ref 150–450)
RBC: 4.98 x10E6/uL (ref 4.14–5.80)
RDW: 13 % (ref 11.6–15.4)
WBC: 9.6 10*3/uL (ref 3.4–10.8)

## 2023-07-26 LAB — COMPREHENSIVE METABOLIC PANEL
ALT: 18 IU/L (ref 0–44)
AST: 20 IU/L (ref 0–40)
Albumin: 4.3 g/dL (ref 3.8–4.9)
Alkaline Phosphatase: 93 IU/L (ref 44–121)
BUN/Creatinine Ratio: 13 (ref 10–24)
BUN: 13 mg/dL (ref 8–27)
Bilirubin Total: 0.6 mg/dL (ref 0.0–1.2)
CO2: 25 mmol/L (ref 20–29)
Calcium: 9.5 mg/dL (ref 8.6–10.2)
Chloride: 93 mmol/L — ABNORMAL LOW (ref 96–106)
Creatinine, Ser: 0.99 mg/dL (ref 0.76–1.27)
Globulin, Total: 2.8 g/dL (ref 1.5–4.5)
Glucose: 86 mg/dL (ref 70–99)
Potassium: 4 mmol/L (ref 3.5–5.2)
Sodium: 131 mmol/L — ABNORMAL LOW (ref 134–144)
Total Protein: 7.1 g/dL (ref 6.0–8.5)
eGFR: 87 mL/min/{1.73_m2} (ref >59)

## 2023-07-26 LAB — TSH: TSH: 1.45 u[IU]/mL (ref 0.450–4.500)

## 2023-07-26 LAB — IRON AND TIBC
Iron Saturation: 26 % (ref 15–55)
Iron: 93 ug/dL (ref 38–169)
Total Iron Binding Capacity: 356 ug/dL (ref 250–450)
UIBC: 263 ug/dL (ref 111–343)

## 2023-07-26 LAB — FERRITIN: Ferritin: 72 ng/mL (ref 30–400)

## 2023-07-26 LAB — MAGNESIUM: Magnesium: 1.8 mg/dL (ref 1.6–2.3)

## 2023-07-26 MED ORDER — ROSUVASTATIN CALCIUM 10 MG PO TABS: 10.0000 mg | ORAL_TABLET | Freq: Every day | ORAL | 4 refills | Status: DC

## 2023-07-26 NOTE — Progress Notes (Signed)
Contacted via MyChart -- lab only visit in 2 weeks please:)   Good morning Luke Briggs, your labs have returned and overall remain stable with exception of salt level (sodium).  I would like to recheck this in 2 weeks via outpatient lab visit and my staff will call to schedule this.  I recommend decrease water intake a little and increase salt intake just a little (do not want too much or blood pressure might get a bit too high).  Cholesterol levels are not quite at goal for stroke prevention, I would like to increase Rosuvastatin to 10 MG daily and stop the 5 MG.  I will send in the 10 MG today.  Would like to see LDL <70.  Any questions for me? Keep being amazing!!  Thank you for allowing me to participate in your care.  I appreciate you. Kindest regards, Rhapsody Wolven

## 2023-07-28 ENCOUNTER — Ambulatory Visit
Admission: RE | Admit: 2023-07-28 | Discharge: 2023-07-28 | Disposition: A | Discharge status: Home / Self Care | Payer: MEDICAID | Attending: Nurse Practitioner | Admitting: Nurse Practitioner

## 2023-07-28 ENCOUNTER — Encounter
Admission: RE | Admit: 2023-07-28 | Discharge: 2023-07-28 | Discharge status: Home / Self Care | Payer: MEDICAID | Source: Ambulatory Visit | Attending: Cardiology | Admitting: Cardiology

## 2023-07-28 ENCOUNTER — Ambulatory Visit
Admission: RE | Admit: 2023-07-28 | Discharge: 2023-07-28 | Disposition: A | Discharge status: Home / Self Care | Payer: MEDICAID | Source: Ambulatory Visit | Attending: Nurse Practitioner | Admitting: Nurse Practitioner

## 2023-07-28 ENCOUNTER — Other Ambulatory Visit: Payer: Self-pay | Admitting: Medical

## 2023-07-28 DIAGNOSIS — R072 Precordial pain: Secondary | ICD-10-CM | POA: Diagnosis not present

## 2023-07-28 DIAGNOSIS — J438 Other emphysema: Secondary | ICD-10-CM

## 2023-07-28 LAB — NM MYOCAR MULTI W/SPECT W/WALL MOTION / EF
LV dias vol: 85 mL (ref 62–150)
LV sys vol: 24 mL
Nuc Stress EF: 72 %
Peak HR: 142 {beats}/min
Rest HR: 76 {beats}/min
Rest Nuclear Isotope Dose: 10.7 mCi
SDS: 2
SRS: 5
SSS: 1
ST Depression (mm): 0 mm
Stress Nuclear Isotope Dose: 28.1 mCi
TID: 0.99

## 2023-07-28 MED ORDER — TECHNETIUM TC 99M TETROFOSMIN IV KIT
28.0900 | PACK | Freq: Once | INTRAVENOUS | Status: AC | PRN
  Administered 2023-07-28: 28.09 via INTRAVENOUS

## 2023-07-28 MED ORDER — TECHNETIUM TC 99M TETROFOSMIN IV KIT
10.7000 | PACK | Freq: Once | INTRAVENOUS | Status: AC | PRN
  Administered 2023-07-28: 10.7 via INTRAVENOUS

## 2023-07-28 MED ORDER — ALBUTEROL SULFATE HFA 108 (90 BASE) MCG/ACT IN AERS: 2.0000 | INHALATION_SPRAY | Freq: Four times a day (QID) | RESPIRATORY_TRACT | 2 refills | Status: DC | PRN

## 2023-07-28 MED ORDER — REGADENOSON 0.4 MG/5ML IV SOLN
0.4000 mg | Freq: Once | INTRAVENOUS | Status: AC
  Administered 2023-07-28: 0.4 mg via INTRAVENOUS

## 2023-07-28 NOTE — Progress Notes (Signed)
Patient has h/o tobacco use. During stress test he started coughing with SOB. HE was administered albuterol inhaler puffs x 2. After test, he reported he has CXR ordered for possible PNA that was to be done today. Physical exam showed very minimal wheezing.

## 2023-07-29 NOTE — Progress Notes (Signed)
Stress test shows no ischemia and is considered a low risk scan.

## 2023-07-29 NOTE — Progress Notes (Signed)
Pt already has an appointment on 08/14/2023 @ 8:20 am.

## 2023-08-01 ENCOUNTER — Ambulatory Visit: Payer: Self-pay | Admitting: *Deleted

## 2023-08-01 NOTE — Telephone Encounter (Signed)
Called and notified patient of [name]'s message.

## 2023-08-01 NOTE — Telephone Encounter (Signed)
  Chief Complaint: He is on prednisone and [name] has ordered more prednisone for him to take prior to a CT scan with contrast due to an allergy to contrast.   Is it ok to be taking this much prednisone?  "I just want to be sure". Symptoms: N/A Frequency: N/A Pertinent Negatives: Patient denies N/A Disposition: [] ED /[] Urgent Care (no appt availability in office) / [] Appointment(In office/virtual)/ []  [facility]/ [] Home Care/ [] Refused Recommended Disposition /[] [facility]/ [x]  Follow-up with PCP Additional Notes: His question has been sent to Aura Dials, NP.    Pt. Agreeable to someone calling him back with her answer.

## 2023-08-01 NOTE — Telephone Encounter (Signed)
Message from Turkey B sent at 08/01/2023  9:02 AM EDT  Summary: med ?   Pt called in about ct scan he is having and he takes prednisone. He is asking is it safe to take more of this prednisone before his Ct scan with this med, that's already in him. He is allergic to the med given before the CT scan is why he was told to take it before the scan          Call History  Contact Date/Time Type Contact Phone/Fax User  08/01/2023 09:00 AM EDT Phone (Incoming) Dahir, Jesusita Oka. (Self) [phone] Rexene Edison) Benton, Victoria M   Reason for Disposition  [1] Caller has URGENT medicine question about med that PCP or specialist prescribed AND [2] triager unable to answer question    Has a question about the extra prednisone he has been ordered for the CT scan with contrast coming up.   He is allergic to CT contrast.  Answer Assessment - Initial Assessment Questions 1. NAME of MEDICINE: "What medicine(s) are you calling about?"     Prednisone 2. QUESTION: "What is your question?" (e.g., double dose of medicine, side effect)     I'm already on prednisone.   [name] has ordered more prednisone for me to take before my CT scan because I'm allergic to the contrast.   Is it ok to be taking this much prednisone?   I just wanted to be sure it was safe. 3. PRESCRIBER: "Who prescribed the medicine?" Reason: if prescribed by specialist, call should be referred to that group.     Aura Dials, NP 4. SYMPTOMS: "Do you have any symptoms?" If Yes, ask: "What symptoms are you having?"  "How bad are the symptoms (e.g., mild, moderate, severe)     No   Prescribed extra prednisone due to an allergy to CT contrast.  5. PREGNANCY:  "Is there any chance that you are pregnant?" "When was your last menstrual period?"     N/A  Protocols used: Medication Question Call-A-AH

## 2023-08-04 ENCOUNTER — Ambulatory Visit
Admission: RE | Admit: 2023-08-04 | Discharge: 2023-08-04 | Disposition: A | Discharge status: Home / Self Care | Payer: MEDICAID | Source: Ambulatory Visit | Attending: Nurse Practitioner | Admitting: Nurse Practitioner

## 2023-08-04 DIAGNOSIS — J438 Other emphysema: Secondary | ICD-10-CM | POA: Diagnosis present

## 2023-08-04 DIAGNOSIS — R7989 Other specified abnormal findings of blood chemistry: Secondary | ICD-10-CM | POA: Diagnosis present

## 2023-08-04 MED ORDER — IOHEXOL 350 MG/ML SOLN
75.0000 mL | Freq: Once | INTRAVENOUS | Status: AC | PRN
  Administered 2023-08-04: 75 mL via INTRAVENOUS

## 2023-08-04 NOTE — Progress Notes (Signed)
Discussed via other imaging with patient

## 2023-08-04 NOTE — Progress Notes (Signed)
Contacted via MyChart   Good afternoon Luke Briggs, all imaging has returned and no clots were noted on CT scan.  Great news!!  Chest x-ray noted no pneumonia.  There were some healing rib fractures noted, consistent with your July ER visit.  Any questions? Keep being amazing!!  Thank you for allowing me to participate in your care.  I appreciate you. Kindest regards, Dallas Scorsone

## 2023-08-10 NOTE — Patient Instructions (Signed)

## 2023-08-11 ENCOUNTER — Other Ambulatory Visit: Payer: MEDICAID

## 2023-08-11 DIAGNOSIS — E871 Hypo-osmolality and hyponatremia: Secondary | ICD-10-CM

## 2023-08-11 DIAGNOSIS — E782 Mixed hyperlipidemia: Secondary | ICD-10-CM

## 2023-08-11 DIAGNOSIS — I739 Peripheral vascular disease, unspecified: Secondary | ICD-10-CM

## 2023-08-11 DIAGNOSIS — K219 Gastro-esophageal reflux disease without esophagitis: Secondary | ICD-10-CM

## 2023-08-11 DIAGNOSIS — I10 Essential (primary) hypertension: Secondary | ICD-10-CM

## 2023-08-11 DIAGNOSIS — N401 Enlarged prostate with lower urinary tract symptoms: Secondary | ICD-10-CM

## 2023-08-11 DIAGNOSIS — F10288 Alcohol dependence with other alcohol-induced disorder: Secondary | ICD-10-CM

## 2023-08-11 DIAGNOSIS — J438 Other emphysema: Secondary | ICD-10-CM

## 2023-08-12 ENCOUNTER — Ambulatory Visit: Payer: MEDICAID | Attending: Cardiology

## 2023-08-12 ENCOUNTER — Other Ambulatory Visit: Payer: Self-pay | Admitting: Nurse Practitioner

## 2023-08-12 DIAGNOSIS — E871 Hypo-osmolality and hyponatremia: Secondary | ICD-10-CM

## 2023-08-12 DIAGNOSIS — R0602 Shortness of breath: Secondary | ICD-10-CM | POA: Diagnosis not present

## 2023-08-12 LAB — COMPREHENSIVE METABOLIC PANEL
ALT: 29 IU/L (ref 0–44)
AST: 40 IU/L (ref 0–40)
Albumin: 4.5 g/dL (ref 3.8–4.9)
Alkaline Phosphatase: 90 IU/L (ref 44–121)
BUN/Creatinine Ratio: 12 (ref 10–24)
BUN: 11 mg/dL (ref 8–27)
Bilirubin Total: 1.4 mg/dL — ABNORMAL HIGH (ref 0.0–1.2)
CO2: 17 mmol/L — ABNORMAL LOW (ref 20–29)
Calcium: 9.5 mg/dL (ref 8.6–10.2)
Chloride: 86 mmol/L — ABNORMAL LOW (ref 96–106)
Creatinine, Ser: 0.91 mg/dL (ref 0.76–1.27)
Globulin, Total: 2.9 g/dL (ref 1.5–4.5)
Glucose: 55 mg/dL — ABNORMAL LOW (ref 70–99)
Potassium: 4.6 mmol/L (ref 3.5–5.2)
Sodium: 126 mmol/L — ABNORMAL LOW (ref 134–144)
Total Protein: 7.4 g/dL (ref 6.0–8.5)
eGFR: 96 mL/min/{1.73_m2} (ref >59)

## 2023-08-12 LAB — ECHOCARDIOGRAM COMPLETE
Area-P 1/2: 3.08 cm2
S' Lateral: 3.5 cm

## 2023-08-12 LAB — MAGNESIUM: Magnesium: 2 mg/dL (ref 1.6–2.3)

## 2023-08-12 LAB — CBC WITH DIFFERENTIAL/PLATELET
Basophils Absolute: 0 10*3/uL (ref 0.0–0.2)
Basos: 0 %
EOS (ABSOLUTE): 0.1 10*3/uL (ref 0.0–0.4)
Eos: 1 %
Hematocrit: 48.1 % (ref 37.5–51.0)
Hemoglobin: 16.3 g/dL (ref 13.0–17.7)
Immature Grans (Abs): 0 10*3/uL (ref 0.0–0.1)
Immature Granulocytes: 0 %
Lymphocytes Absolute: 1.6 10*3/uL (ref 0.7–3.1)
Lymphs: 15 %
MCH: 31.3 pg (ref 26.6–33.0)
MCHC: 33.9 g/dL (ref 31.5–35.7)
MCV: 93 fL (ref 79–97)
Monocytes Absolute: 0.6 10*3/uL (ref 0.1–0.9)
Monocytes: 6 %
Neutrophils Absolute: 7.9 10*3/uL — ABNORMAL HIGH (ref 1.4–7.0)
Neutrophils: 78 %
Platelets: 344 10*3/uL (ref 150–450)
RBC: 5.2 x10E6/uL (ref 4.14–5.80)
RDW: 12.9 % (ref 11.6–15.4)
WBC: 10.2 10*3/uL (ref 3.4–10.8)

## 2023-08-12 LAB — LIPID PANEL W/O CHOL/HDL RATIO
Cholesterol, Total: 178 mg/dL (ref 100–199)
HDL: 72 mg/dL (ref >39)
LDL Chol Calc (NIH): 83 mg/dL (ref 0–99)
Triglycerides: 136 mg/dL (ref 0–149)
VLDL Cholesterol Cal: 23 mg/dL (ref 5–40)

## 2023-08-12 LAB — TSH: TSH: 1.42 u[IU]/mL (ref 0.450–4.500)

## 2023-08-12 LAB — PSA: Prostate Specific Ag, Serum: 0.3 ng/mL (ref 0.0–4.0)

## 2023-08-12 NOTE — Progress Notes (Signed)
Contacted via MyChart -- please call to ensure he receives message, also needs lab visit only Thursday please. Good afternoon Luke Briggs, your labs have returned and I have some concerns: - Your sodium (salt) level is quite low.  This is concerning.  Are you having any increase in fatigue, cramping of muscles, headaches, loss of energy, or irritability.  If so these are symptoms and we need you to go to ER for further evaluation and IV fluids to get levels up. Have you been drinking a lot of water or alcohol?  Try to cut back on these and add a tad of salt to diet.  Remember if symptoms above GO TO ER.  If no symptoms we can try outpatient treatment with the recommendations above and we will recheck level on Thursday outpatient. - Remainder of labs are all stable.  Any questions?  Please let me know how you are doing ASAP as I am concerned about this. Keep being excellent!!  Thank you for allowing me to participate in your care.  I appreciate you. Kindest regards, Leonia Heatherly

## 2023-08-13 NOTE — Progress Notes (Signed)
Heart squeeze 50-55%, there are no wall motion abnormalities noted, there is some muscle stiffness or impaired relaxation noted which can be from high blood pressure or from age.  There is mild leakage noted in the mitral valve.

## 2023-08-14 ENCOUNTER — Ambulatory Visit (INDEPENDENT_AMBULATORY_CARE_PROVIDER_SITE_OTHER): Payer: MEDICAID | Admitting: Nurse Practitioner

## 2023-08-14 ENCOUNTER — Other Ambulatory Visit: Payer: Self-pay

## 2023-08-14 ENCOUNTER — Encounter: Payer: Self-pay | Admitting: Emergency Medicine

## 2023-08-14 ENCOUNTER — Encounter: Payer: Self-pay | Admitting: Nurse Practitioner

## 2023-08-14 ENCOUNTER — Emergency Department
Admission: EM | Admit: 2023-08-14 | Discharge: 2023-08-14 | Disposition: A | Discharge status: Home / Self Care | Payer: MEDICAID | Attending: Emergency Medicine | Admitting: Emergency Medicine

## 2023-08-14 VITALS — BP 129/77 | HR 82 | Temp 97.7°F | Wt 188.0 lb

## 2023-08-14 DIAGNOSIS — J438 Other emphysema: Secondary | ICD-10-CM

## 2023-08-14 DIAGNOSIS — R21 Rash and other nonspecific skin eruption: Secondary | ICD-10-CM | POA: Diagnosis present

## 2023-08-14 DIAGNOSIS — E871 Hypo-osmolality and hyponatremia: Secondary | ICD-10-CM

## 2023-08-14 LAB — COMPREHENSIVE METABOLIC PANEL
ALT: 33 U/L (ref 0–44)
AST: 28 U/L (ref 15–41)
Albumin: 3.9 g/dL (ref 3.5–5.0)
Alkaline Phosphatase: 74 U/L (ref 38–126)
Anion gap: 13 (ref 5–15)
BUN: 14 mg/dL (ref 6–20)
CO2: 23 mmol/L (ref 22–32)
Calcium: 9.2 mg/dL (ref 8.9–10.3)
Chloride: 94 mmol/L — ABNORMAL LOW (ref 98–111)
Creatinine, Ser: 0.91 mg/dL (ref 0.61–1.24)
GFR, Estimated: >60 mL/min (ref >60)
Glucose, Bld: 90 mg/dL (ref 70–99)
Potassium: 3.9 mmol/L (ref 3.5–5.1)
Sodium: 130 mmol/L — ABNORMAL LOW (ref 135–145)
Total Bilirubin: 1.3 mg/dL — ABNORMAL HIGH (ref 0.3–1.2)
Total Protein: 7.6 g/dL (ref 6.5–8.1)

## 2023-08-14 LAB — CBC WITH DIFFERENTIAL/PLATELET
Abs Immature Granulocytes: 0.03 10*3/uL (ref 0.00–0.07)
Basophils Absolute: 0 10*3/uL (ref 0.0–0.1)
Basophils Relative: 0 %
Eosinophils Absolute: 0.2 10*3/uL (ref 0.0–0.5)
Eosinophils Relative: 2 %
HCT: 46 % (ref 39.0–52.0)
Hemoglobin: 16.1 g/dL (ref 13.0–17.0)
Immature Granulocytes: 0 %
Lymphocytes Relative: 16 %
Lymphs Abs: 1.7 10*3/uL (ref 0.7–4.0)
MCH: 31 pg (ref 26.0–34.0)
MCHC: 35 g/dL (ref 30.0–36.0)
MCV: 88.6 fL (ref 80.0–100.0)
Monocytes Absolute: 1 10*3/uL (ref 0.1–1.0)
Monocytes Relative: 10 %
Neutro Abs: 7.5 10*3/uL (ref 1.7–7.7)
Neutrophils Relative %: 72 %
Platelets: 309 10*3/uL (ref 150–400)
RBC: 5.19 MIL/uL (ref 4.22–5.81)
RDW: 12.8 % (ref 11.5–15.5)
WBC: 10.4 10*3/uL (ref 4.0–10.5)
nRBC: 0 % (ref 0.0–0.2)

## 2023-08-14 LAB — URINALYSIS, ROUTINE W REFLEX MICROSCOPIC
Bilirubin Urine: NEGATIVE
Glucose, UA: NEGATIVE mg/dL
Hgb urine dipstick: NEGATIVE
Ketones, ur: NEGATIVE mg/dL
Leukocytes,Ua: NEGATIVE
Nitrite: NEGATIVE
Protein, ur: NEGATIVE mg/dL
Specific Gravity, Urine: 1.011 (ref 1.005–1.030)
pH: 6 (ref 5.0–8.0)

## 2023-08-14 LAB — CBG MONITORING, ED: Glucose-Capillary: 84 mg/dL (ref 70–99)

## 2023-08-14 MED ORDER — DEXAMETHASONE SODIUM PHOSPHATE 10 MG/ML IJ SOLN
10.0000 mg | Freq: Once | INTRAMUSCULAR | Status: AC
  Administered 2023-08-14: 10 mg via INTRAMUSCULAR
  Filled 2023-08-14: qty 1

## 2023-08-14 NOTE — Progress Notes (Signed)
BP 129/77   Pulse 82   Temp 97.7 F (36.5 C) (Oral)   Wt 188 lb (85.3 kg)   SpO2 97%   BMI 26.98 kg/m    Subjective:    Patient ID: Luke Boatman., male    DOB: 06/27/1961, 62 y.o.   MRN: [id number]  HPI: Luke Garfias. is a 62 y.o. male  Chief Complaint  Patient presents with   Cough    2 week f/up   COUGH + HYPONATREMIA Has improved -- has returned to smokers cough only.  Low sodium on recent labs -- 126, he has been trying to take sodium.  Reports feeling tired, muscle twitches, cramps, and weak today + has been more irritable and nauseous.  Feels like he is dying. Duration: weeks Circumstances of initial development of cough:  Covid  Cough severity: mild Cough description: non-productive Status:  stable Treatments attempted: abx therapy and steroids Wheezing: yes Shortness of breath: no Chest pain: no Chest tightness:yes Nasal congestion: no Runny nose: no Postnasal drip: no Frequent throat clearing or swallowing: no Hemoptysis: no Fevers: no Night sweats:  sometimes Weight loss: no Heartburn: no Recent foreign travel: no Tuberculosis contacts: no   Relevant past medical, surgical, family and social history reviewed and updated as indicated. Interim medical history since our last visit reviewed. Allergies and medications reviewed and updated.  Review of Systems  Constitutional:  Positive for fatigue. Negative for activity change, appetite change, chills, diaphoresis and fever.  Respiratory:  Positive for cough (mild per baseline) and chest tightness. Negative for shortness of breath and wheezing.   Cardiovascular:  Negative for chest pain, palpitations and leg swelling.  Gastrointestinal:  Positive for nausea. Negative for abdominal distention, abdominal pain, constipation, diarrhea and vomiting.  Musculoskeletal:  Positive for arthralgias.  Skin:  Positive for rash.  Neurological:  Positive for tremors, weakness, light-headedness and  headaches. Negative for dizziness, syncope, facial asymmetry, speech difficulty and numbness.  Psychiatric/Behavioral:  Negative for confusion, decreased concentration, self-injury, sleep disturbance and suicidal ideas. The patient is not nervous/anxious.     Per HPI unless specifically indicated above     Objective:    BP 129/77   Pulse 82   Temp 97.7 F (36.5 C) (Oral)   Wt 188 lb (85.3 kg)   SpO2 97%   BMI 26.98 kg/m   Wt Readings from Last 3 Encounters:  08/14/23 188 lb (85.3 kg)  07/25/23 188 lb (85.3 kg)  07/15/23 185 lb 9.6 oz (84.2 kg)    Physical Exam Vitals and nursing note reviewed.  Constitutional:      General: He is awake. He is not in acute distress.    Appearance: He is well-developed and well-groomed. He is ill-appearing. He is not toxic-appearing.  HENT:     Head: Normocephalic.     Right Ear: Hearing and external ear normal.     Left Ear: Hearing and external ear normal.  Eyes:     General: Lids are normal. No visual field deficit.    Extraocular Movements: Extraocular movements intact.     Conjunctiva/sclera: Conjunctivae normal.  Neck:     Thyroid: No thyromegaly.     Vascular: No carotid bruit.  Cardiovascular:     Rate and Rhythm: Normal rate and regular rhythm.     Heart sounds: Normal heart sounds. No murmur heard.    No gallop.  Pulmonary:     Effort: Pulmonary effort is normal. No accessory muscle usage or respiratory distress.  Breath sounds: Wheezing present. No decreased breath sounds or rhonchi.     Comments: Occasional expiratory wheezes noted. Abdominal:     General: Bowel sounds are normal. There is no distension.     Palpations: Abdomen is soft.     Tenderness: There is no abdominal tenderness.  Musculoskeletal:     Cervical back: Full passive range of motion without pain.     Right lower leg: No edema.     Left lower leg: No edema.  Lymphadenopathy:     Cervical: No cervical adenopathy.  Skin:    General: Skin is warm.      Capillary Refill: Capillary refill takes less than 2 seconds.     Findings: Rash present.     Comments: Blister type rash noted to palmar aspect of right hand.  Both hands with swelling and erythema.    Neurological:     Mental Status: He is alert and oriented to person, place, and time.     Cranial Nerves: No facial asymmetry.     Sensory: No sensory deficit.     Motor: Weakness and tremor (both hands) present.     Coordination: Coordination is intact.     Gait: Gait is intact.     Deep Tendon Reflexes: Reflexes are normal and symmetric.     Reflex Scores:      Brachioradialis reflexes are 2+ on the right side and 2+ on the left side.      Patellar reflexes are 2+ on the right side and 2+ on the left side. Psychiatric:        Attention and Perception: Attention normal.        Mood and Affect: Mood normal.        Speech: Speech normal.        Behavior: Behavior is cooperative.        Thought Content: Thought content normal.     Comments: Slightly more irritable then his baseline.     Results for orders placed or performed in visit on 08/12/23  ECHOCARDIOGRAM COMPLETE  Result Value Ref Range   S' Lateral 3.50 cm   Area-P 1/2 3.08 cm2   Est EF 50 - 55%       Assessment & Plan:   Problem List Items Addressed This Visit       Respiratory   Paraseptal emphysema (HCC) - Primary    Chronic, ongoing.  Continue to collaborate with pulmonary, recommend he continue to attend scheduled visits with them and if cardiac work-up reassuring recommend he return to pulmonary for further work-up for SOB.  Continue Breztri BID and Albuterol as needed at this time.  Continue yearly lung screening.  Recommend complete cessation of smoking. Cough has overall improved.        Other   Hyponatremia    Recent labs NA+ 126, on exam today he is more irritable and tremor to upper extremities.  He reports symptoms of hyponatremia: nausea, weakness, cramping, headaches, and fatigue.  Has been advised  to go to ER, his significant other will take him over there today.  Concerned that he is symptomatic with low sodium.  Appears ill today.  May benefit from IV fluids.  Recommend he cut back on alcohol use at home, suspect this is contributing to low NA+.      Other Visit Diagnoses     Rash       Recommend he trial Kenalog cream at home and take Claritin daily for next 10 days.  May  need Prednisone or dermatology referral if ongoing.        Follow up plan: Return for will follow-up with him after ER visit.[name]

## 2023-08-14 NOTE — ED Triage Notes (Signed)
Pt to ED via POV from his PCP office. Pt states that he had lab work done 3 days ago and went for follow up today. Pt was sent to the ED because his sodium was 126 and glucose was 55 on 9/23. Pt is also c/o having a rash all over his body that itches. Pt is in NAD.

## 2023-08-14 NOTE — Assessment & Plan Note (Signed)
Recent labs NA+ 126, on exam today he is more irritable and tremor to upper extremities.  He reports symptoms of hyponatremia: nausea, weakness, cramping, headaches, and fatigue.  Has been advised to go to ER, his significant other will take him over there today.  Concerned that he is symptomatic with low sodium.  Appears ill today.  May benefit from IV fluids.  Recommend he cut back on alcohol use at home, suspect this is contributing to low NA+.

## 2023-08-14 NOTE — ED Notes (Signed)
Pt reports that this RN that he has had vomiting and diarrhea every day for the last 2 years. Pt was asked if he had follow up with GI doctor about his and he said he has not.

## 2023-08-14 NOTE — ED Provider Notes (Signed)
[facility] Provider Note    Event Date/Time   First MD Initiated Contact with Patient 08/14/23 1112     (approximate)   History   Abnormal Labs   HPI  Luke Briggs. is a 62 y.o. male sent in by his PCP for evaluation of hyponatremia, outpatient sodium of 126.  Patient complains primarily of recurrent pruritic diffuse rash typically resolve briefly by prednisone which he just finished.  No dizziness, no lightheadedness no extremity weakness.  No fevers     Physical Exam   Triage Vital Signs: ED Triage Vitals  Encounter Vitals Group     BP 08/14/23 0910 (!) 161/97     Systolic BP Percentile --      Diastolic BP Percentile --      Pulse Rate 08/14/23 0910 84     Resp 08/14/23 0910 17     Temp 08/14/23 0910 98.1 F (36.7 C)     Temp Source 08/14/23 0910 Oral     SpO2 08/14/23 0910 97 %     Weight 08/14/23 0911 85.3 kg (188 lb)     Height 08/14/23 0911 1.778 m (5\' 10" )     Head Circumference --      Peak Flow --      Pain Score 08/14/23 0911 0     Pain Loc --      Pain Education --      Exclude from Growth Chart --     Most recent vital signs: Vitals:   08/14/23 0910  BP: (!) 161/97  Pulse: 84  Resp: 17  Temp: 98.1 F (36.7 C)  SpO2: 97%     General: Awake, no distress.  CV:  Good peripheral perfusion.  Resp:  Normal effort.  Abd:  No distention.  Other:  Diffuse pruritic macular rash   ED Results / Procedures / Treatments   Labs (all labs ordered are listed, but only abnormal results are displayed) Labs Reviewed  COMPREHENSIVE METABOLIC PANEL - Abnormal; Notable for the following components:      Result Value   Sodium 130 (*)    Chloride 94 (*)    Total Bilirubin 1.3 (*)    All other components within normal limits  URINALYSIS, ROUTINE W REFLEX MICROSCOPIC - Abnormal; Notable for the following components:   Color, Urine YELLOW (*)    APPearance CLEAR (*)    All other components within normal limits  CBC WITH  DIFFERENTIAL/PLATELET  CBG MONITORING, ED     EKG     RADIOLOGY     PROCEDURES:  Critical Care performed:   Procedures   MEDICATIONS ORDERED IN ED: Medications  dexamethasone (DECADRON) injection 10 mg (10 mg Intramuscular Given 08/14/23 1130)     IMPRESSION / MDM / ASSESSMENT AND PLAN / ED COURSE  I reviewed the triage vital signs and the nursing notes. Patient's presentation is most consistent with acute presentation with potential threat to life or bodily function.  Patient presents with reported hyponatremia, chronic rash.  Does not appear to be on diuretics.  Sodium rechecked here and it is 130 which appears to be in his typical range.  Otherwise lab work is unremarkable.  Will give a dose of Decadron for his pruritic rash, outpatient follow-up recommended.        FINAL CLINICAL IMPRESSION(S) / ED DIAGNOSES   Final diagnoses:  Hyponatremia  Rash     Rx / DC Orders   ED Discharge Orders     None  Note:  This document was prepared using Dragon voice recognition software and may include unintentional dictation errors.   Jene Every, MD 08/14/23 1147

## 2023-08-14 NOTE — Assessment & Plan Note (Addendum)
Chronic, ongoing.  Continue to collaborate with pulmonary, recommend he continue to attend scheduled visits with them and if cardiac work-up reassuring recommend he return to pulmonary for further work-up for SOB.  Continue Breztri BID and Albuterol as needed at this time.  Continue yearly lung screening.  Recommend complete cessation of smoking. Cough has overall improved.

## 2023-08-18 ENCOUNTER — Encounter: Payer: Self-pay | Admitting: Cardiology

## 2023-08-18 ENCOUNTER — Ambulatory Visit: Payer: MEDICAID | Attending: Cardiology | Admitting: Cardiology

## 2023-08-18 VITALS — BP 142/88 | HR 86 | Ht 70.0 in | Wt 188.4 lb

## 2023-08-18 DIAGNOSIS — I10 Essential (primary) hypertension: Secondary | ICD-10-CM | POA: Diagnosis not present

## 2023-08-18 DIAGNOSIS — R002 Palpitations: Secondary | ICD-10-CM

## 2023-08-18 DIAGNOSIS — I6523 Occlusion and stenosis of bilateral carotid arteries: Secondary | ICD-10-CM | POA: Diagnosis not present

## 2023-08-18 DIAGNOSIS — F17210 Nicotine dependence, cigarettes, uncomplicated: Secondary | ICD-10-CM

## 2023-08-18 DIAGNOSIS — E782 Mixed hyperlipidemia: Secondary | ICD-10-CM

## 2023-08-18 DIAGNOSIS — I739 Peripheral vascular disease, unspecified: Secondary | ICD-10-CM

## 2023-08-18 DIAGNOSIS — R0602 Shortness of breath: Secondary | ICD-10-CM

## 2023-08-18 DIAGNOSIS — E871 Hypo-osmolality and hyponatremia: Secondary | ICD-10-CM

## 2023-08-18 DIAGNOSIS — F10288 Alcohol dependence with other alcohol-induced disorder: Secondary | ICD-10-CM

## 2023-08-18 MED ORDER — LOSARTAN POTASSIUM 100 MG PO TABS: 100.0000 mg | ORAL_TABLET | Freq: Every day | ORAL | 3 refills | Status: DC

## 2023-08-18 NOTE — Progress Notes (Signed)
Cardiology Office Note:  .   Date:  08/18/2023  ID:  [name], DOB 01/06/1961, MRN [id number] PCP: Marjie Skiff, NP  [facility] Providers Cardiologist:  Debbe Odea, MD    History of Present Illness: .   Velton Bou. is a 62 y.o. male with a past medical history of aortic atherosclerosis, carotid stenosis followed by [facility], essential hypertension, GERD, tobacco abuse, heavy EtOH abuse, osteoarthritis, BPH, chronic pain syndrome, schizophrenia, mixed hyperlipidemia, who is here for follow-up after recent visit to the emergency department.  Prior echocardiogram completed 10/2019 showed normal systolic and diastolic function.  Lexiscan Myoview completed 01/24/2021 showed no evidence of ischemia.  Was last seen in clinic 07/15/2023 with complaints of night sweats, palpitations, shortness of breath, fatigue, chest pain/tightness.  He stated he had sustained a fall and landed on the left side of his chest and had ongoing chest pain since that time.  He was scheduled for repeat Lexiscan Myoview and a repeat echocardiogram.  If palpitations were to continue that he would be placed on a ZIO XT monitor.  He presented to the [facility] emergency department on 08/14/2023 for abnormal labs.  He was sent by his PCP for the evaluation of hyponatremia with an outpatient sodium of 126.  Blood pressure was suboptimally controlled at 161/97, pulse of 84, respirations 17, temperature 98.1.  Repeat labs in the hospital revealed sodium of 130, chloride 94, total bilirubin of 1.3.  He was given a dose of 10 mg of dexamethasone.  He was discharged with outpatient follow-up recommended.  He returns to clinic today with various complaints of fatigue, shortness of breath, lightheadedness/dizziness, rash and recent emergency department visit for low sodium.  He has lots of questions today about his blood pressure medications and is requesting changes to his current medication regimen.  He denies any  chest pain, chest tightness, or palpitations.  States that he has been compliant with the current medications that he is on.  He also has questions of his steroids have exacerbated his symptoms.  He endorses he continues to drink 8-12 beers daily.  He states he was also advised to increase his sodium intake with his sodium being low.  ROS: 10 point review of system has been reviewed and considered negative with exception was been listed in the HPI  Studies Reviewed: [name]       TTE 08/12/23 1. Left ventricular ejection fraction, by estimation, is 50 to 55%. Left  ventricular ejection fraction by 3D volume is 51 %. The left ventricle has  low normal function. The left ventricle has no regional wall motion  abnormalities. Left ventricular  diastolic parameters are consistent with Grade I diastolic dysfunction  (impaired relaxation). The average left ventricular global longitudinal  strain is -11.0 %.   2. Right ventricular systolic function is normal. The right ventricular  size is normal. There is normal pulmonary artery systolic pressure. The  estimated right ventricular systolic pressure is 21.0 mmHg.   3. The mitral valve is normal in structure. Mild mitral valve  regurgitation. No evidence of mitral stenosis.   4. The aortic valve is tricuspid. Aortic valve regurgitation is not  visualized. No aortic stenosis is present.   5. The inferior vena cava is normal in size with greater than 50%  respiratory variability, suggesting right atrial pressure of 3 mmHg.   Lexiscan MPI 07/28/23   The study is normal. The study is low risk.   No ST deviation was noted.  LV perfusion is normal. There is no evidence of ischemia. There is no evidence of infarction.   Left ventricular function is normal. End diastolic cavity size is normal. End systolic cavity size is normal.   CT attenuation images showed minimal aortic and coronary calcifications.  Lexiscan MPI 01/24/2021 Narrative & Impression  T wave  inversion was noted during stress in the V4, V5, V6 and II leads. There was no ST segment deviation noted during stress. The study is normal. This is a low risk study. The left ventricular ejection fraction is normal (55-65%). CT attenuation images showed no significant aortic or coronary calcifications.    This result has not been signed. Information might be incomplete.    TTE 11/18/2019 1. Left ventricular ejection fraction, by visual estimation, is 60 to  65%. The left ventricle has normal function. There is no left ventricular  hypertrophy.   2. The left ventricle has no regional wall motion abnormalities.   3. Global right ventricle has normal systolic function.The right  ventricular size is normal. No increase in right ventricular wall  thickness.   4. Left atrial size was normal.   5. Right atrial size was normal.   6. The mitral valve is normal in structure. No evidence of mitral valve  regurgitation. No evidence of mitral stenosis.   7. The tricuspid valve is normal in structure.   8. The aortic valve was not well visualized. Aortic valve regurgitation  is not visualized. No evidence of aortic valve sclerosis or stenosis.   9. The pulmonic valve was not well visualized. Pulmonic valve  regurgitation is not visualized.  10. Normal pulmonary artery systolic pressure.  11. The inferior vena cava is normal in size with greater than 50%  respiratory variability, suggesting right atrial pressure of 3 mmHg.  Risk Assessment/Calculations:     HYPERTENSION CONTROL Vitals:   08/18/23 0802 08/18/23 0807  BP: (!) 152/84 (!) 142/88    The patient's blood pressure is elevated above target today.  In order to address the patient's elevated BP: A current anti-hypertensive medication was adjusted today.          Physical Exam:   VS:  BP (!) 142/88 (BP Location: Right Arm, Patient Position: Sitting, Cuff Size: Normal)   Pulse 86   Ht 5\' 10"  (1.778 m)   Wt 188 lb 6.4 oz (85.5  kg)   SpO2 98%   BMI 27.03 kg/m    Wt Readings from Last 3 Encounters:  08/18/23 188 lb 6.4 oz (85.5 kg)  08/14/23 188 lb (85.3 kg)  08/14/23 188 lb (85.3 kg)    GEN: Well nourished, well developed in no acute distress NECK: No JVD; No carotid bruits CARDIAC: RRR, no murmurs, rubs, gallops RESPIRATORY:  Clear to auscultation without rales, wheezing or rhonchi  ABDOMEN: Soft, non-tender, non-distended Integumentary: Cracked skin to the palms of his hands with rashes on his bilateral upper extremities resembling eczema EXTREMITIES:  No edema; No deformity   ASSESSMENT AND PLAN: .   Shortness of breath that he states has been worsening over the last year without any current chest tightness or chest pain.  Echocardiogram revealed LVEF 50-55%, no RWMA, G1 DD, and mild MR.  Previous concerns for anginal equivalent with a low risk Myoview completed.  He also underwent chest CT ordered by his PCP they ruled out PE.   Palpitations are less intense at this time.  He has been encouraged to decrease his caffeine and alcohol intake.  His Myoview and  echocardiogram were unrevealing.  Previously he had been on carvedilol 25 mg twice daily unfortunately he states the medication was continued as he is unsure of the plan.  If he has increased episodes of palpitations in the future can revisit this once yearly to monitor.  Hypertension with blood pressure today 152/84 140/88.  Blood pressures are slightly elevated today he is concerned about his current medication of olmesartan/HCTZ 40/25 mg daily.  Patient attributes this medication to his current symptoms.  He has been changed from olmesartan 40 mg daily to losartan 100 mg daily.  He will have a BMP in 2 weeks.  He has been encouraged to monitor his pressure at home as well if possible.  Chronic hyponatremia with worsening sodium prompting emergency department visit by his PCP with a serum sodium of 126.  On arrival to the emergency department serum sodium  was up to 130 which trends closer to his baseline.  With his chronic hyponatremia his HCTZ has been discontinued out of his blood pressure medication today.  Repeat BMP ordered in 2 weeks.  Mixed hyperlipidemia with an LDL of 100.  He had recently been changed from atorvastatin to rosuvastatin.  He has upcoming hepatic and lipid panel ordered.  Peripheral arterial disease of bilateral carotid stenosis he continues to be followed by [facility].  He is encouraged to continue with his statin therapy.  Carotid duplex ordered by [facility].  Tobacco and alcohol abuse/desire to quit either at this time.  Total cessation is recommended.      Dispo: Patient to return to clinic to see MD/APP in 4 weeks or sooner if needed to reevaluate medication changes made today  Signed, Imanol Bihl, NP

## 2023-08-18 NOTE — Patient Instructions (Signed)
Medication Instructions:  Your physician has recommended you make the following change in your medication:   STOP - olmesartan-hydrochlorothiazide (BENICAR HCT) 40-25 MG tablet  START - losartan (COZAAR) 100 MG tablet - Take 1 tablet (100 mg total) by mouth daily  *If you need a refill on your cardiac medications before your next appointment, please call your pharmacy*  Lab Work: Your provider would like for you to return in 2 weeks to have the following labs drawn: BMET.   Please go to [facility] [address] ([facility] [address], [place] [address] You do not need an appointment.  They are open from 7:30 am-4 pm.  Lunch from 1:00 pm- 2:00 pm You do need to be fasting.  You may also go to any of these [facility] locations:  [employer]  - [address] [employer] [address] Chief Technology Officer)   If you have labs (blood work) drawn today and your tests are completely normal, you will receive your results only by: [employer] (if you have MyChart) OR A paper copy in the mail If you have any lab test that is abnormal or we need to change your treatment, we will call you to review the results.  Testing/Procedures: -None ordered  Follow-Up: At [facility], you and your health needs are our priority.  As part of our continuing mission to provide you with exceptional heart care, we have created designated Provider Care Teams.  These Care Teams include your primary Cardiologist (physician) and Advanced Practice Providers (APPs -  Physician Assistants and Nurse Practitioners) who all work together to provide you with the care you need, when you need it.  Your next appointment:   4 week(s)  Provider:   You may see Debbe Odea, MD or one of the following Advanced Practice Providers on your designated Care Team:   Nicolasa Ducking, NP Eula Listen, PA-C Cadence Fransico Michael, PA-C Charlsie Quest, NP    Other Instructions -None

## 2023-08-28 ENCOUNTER — Encounter: Payer: Self-pay | Admitting: Dermatology

## 2023-08-28 ENCOUNTER — Ambulatory Visit: Payer: MEDICAID | Admitting: Family Medicine

## 2023-08-28 ENCOUNTER — Ambulatory Visit: Payer: MEDICAID | Admitting: Dermatology

## 2023-08-28 VITALS — BP 210/112 | HR 74

## 2023-08-28 DIAGNOSIS — I10 Essential (primary) hypertension: Secondary | ICD-10-CM | POA: Diagnosis not present

## 2023-08-28 DIAGNOSIS — L309 Dermatitis, unspecified: Secondary | ICD-10-CM

## 2023-08-28 DIAGNOSIS — L209 Atopic dermatitis, unspecified: Secondary | ICD-10-CM | POA: Diagnosis not present

## 2023-08-28 MED ORDER — DUPIXENT 300 MG/2ML ~~LOC~~ SOAJ
300.0000 mg | SUBCUTANEOUS | 5 refills | Status: DC
Start: 2023-08-28 — End: 2023-12-24

## 2023-08-28 MED ORDER — HYDROCORTISONE 2.5 % EX CREA: TOPICAL_CREAM | CUTANEOUS | 2 refills | Status: AC

## 2023-08-28 MED ORDER — DUPILUMAB 300 MG/2ML ~~LOC~~ SOPN
600.0000 mg | PEN_INJECTOR | Freq: Once | SUBCUTANEOUS | Status: AC
Start: 2023-08-28 — End: 2023-08-28
  Administered 2023-08-28: 600 mg via SUBCUTANEOUS

## 2023-08-28 MED ORDER — TRIAMCINOLONE ACETONIDE 0.1 % EX CREA: TOPICAL_CREAM | CUTANEOUS | 3 refills | Status: DC

## 2023-08-28 MED ORDER — DUPILUMAB 300 MG/2ML ~~LOC~~ SOAJ
300.0000 mg | SUBCUTANEOUS | Status: AC
Start: 2023-09-11 — End: 2023-09-11
  Administered 2023-09-11: 300 mg via SUBCUTANEOUS

## 2023-08-28 MED ORDER — CLOBETASOL PROPIONATE 0.05 % EX CREA: TOPICAL_CREAM | CUTANEOUS | 2 refills | Status: DC

## 2023-08-28 NOTE — Progress Notes (Signed)
   New Patient Visit   Subjective  Luke Briggs. is a 62 y.o. male who presents for the following: Rash. Itches from head to toe. Has rash all over. Hands and head itch worse. States has had a hx of eczema all his life. The last 3 months have been worse. PCP has prescribed Triamcinolone cream helps but only had 30 gm tube. Has tried OTC moisturizers and eczema creams. Worse when takes a shower, warm or cool.  Per PCP note rash/itching improves/resolves while on oral prednisone but recurs when finished.  States feels like bugs are crawling on his head. States he feels like he is being eaten alive by bugs on his body. No recent changes to detergent, topical products, etc  The following portions of the chart were reviewed this encounter and updated as appropriate: medications, allergies, medical history  Review of Systems:  No other skin or systemic complaints except as noted in HPI or Assessment and Plan.  Objective  Well appearing patient in no apparent distress; mood and affect are within normal limits.  A focused examination was performed of the following areas: Scalp, face, torso, arms, hands, legs, feet  Relevant exam findings are noted in the Assessment and Plan.    Assessment & Plan   Atopic dermatitis, unspecified type  Related Medications Dupilumab SOPN 600 mg   Dupilumab SOPN 300 mg     RASH, severe eczema flare Exam: pink scaly papules and plaques on all extremities, trunk, face. Diffuse erythema and scaling of scalp. Diffuse excoriations. Lichenification and fissuring on hands. 20% BSA  Chronic and persistent condition with duration or expected duration over one year. Condition is bothersome/symptomatic for patient. Currently flared.   Treatment Plan: Start Triamcinolone 0.1% cream twice daily to affected body areas as needed for itching. Avoid applying to face, groin, and axilla. Use as directed. Long-term use can cause thinning of the skin.  Start  Clobetasol 0.05% cream twice daily to hands as needed for itching. Avoid applying to face, groin, and axilla. Use as directed. Long-term use can cause thinning of the skin.  Start Hydrocortisone 2.5% cream twice daily 1-2 weeks as needed for rash/itching on face.   Dupixent 300 mg/2 ml x2 pens injected into B/L upper arms. Patient tolerated well.  NDC: 5427-0623-76 Lot: [id number] Exp: 03/17/2025  Start Dupixent 300 mg/2 ml injection every 2 weeks.   Routine skin care guide given.   Return in about 2 weeks (around 09/11/2023) for Dupixent Injection On Nurse Schedule, 4 weeks dermatitis follow up with Dr. Katrinka Briggs.  Hypertension Dr. Katrinka Briggs messaged patient's PCP regarding his elevated blood pressure (210/112). Patient did not report symptoms suggestive of hypertensive emergency. He was given a 1:20 pm PCP appointment for today. I advised Luke Briggs of this appointment. Luke Briggs advised me that he will not be able to make that appointment. He states he is going home to eat and will take his other BP medication. He states he took his new BP med last night. I advised he should try to keep this appointment at PCP office. Discussed risk of stroke and stressed the importance of keeping his appointment today or going to the emergency department if he develops symptoms.    I, Luke Briggs, CMA, am acting as scribe for Luke Goody, MD.   Documentation: I have reviewed the above documentation for accuracy and completeness, and I agree with the above.  Luke Goody, MD

## 2023-08-28 NOTE — Patient Instructions (Addendum)
Start Dupixent 300 mg/2 ml injection every 2 weeks.   Dupixent MyWay paperwork filled out and faxed.   Dupilumab (Dupixent) is a treatment given by injection for adults and children with moderate-to-severe atopic dermatitis. Goal is control of skin condition, not cure. It is given as 2 injections at the first dose followed by 1 injection ever 2 weeks thereafter.  Young children are dosed monthly.  Potential side effects include allergic reaction, herpes infections, injection site reactions and conjunctivitis (inflammation of the eyes).  The use of Dupixent requires long term medication management, including periodic office visits.     Start Triamcinolone 0.1% cream twice daily to affected body areas as needed for itching. Avoid applying to face, groin, and axilla. Use as directed. Long-term use can cause thinning of the skin.   Start Clobetasol 0.05% cream twice daily to hands as needed for itching. Avoid applying to face, groin, and axilla. Use as directed. Long-term use can cause thinning of the skin.  Start Hydrocortisone 2.5% cream twice daily 1-2 weeks as needed for rash/itching on face.   Topical steroids (such as triamcinolone, fluocinolone, fluocinonide, mometasone, clobetasol, halobetasol, betamethasone, hydrocortisone) can cause thinning and lightening of the skin if they are used for too long in the same area. Your physician has selected the right strength medicine for your problem and area affected on the body. Please use your medication only as directed by your physician to prevent side effects.    Gentle Skin Care Guide  1. Bathe no more than once a day.  2. Avoid bathing in hot water  3. Use a mild soap like Dove, Vanicream, Cetaphil, CeraVe. Can use Lever 2000 or Cetaphil antibacterial soap  4. Use soap only where you need it. On most days, use it under your arms, between your legs, and on your feet. Let the water rinse other areas unless visibly dirty.  5. When you get  out of the bath/shower, use a towel to gently blot your skin dry, don't rub it.  6. While your skin is still a little damp, apply a moisturizing cream such as Vanicream, CeraVe, Cetaphil, Eucerin, Sarna lotion or plain Vaseline Jelly. For hands apply Neutrogena [place] Hand Cream or Excipial Hand Cream.  7. Reapply moisturizer any time you start to itch or feel dry.  8. Sometimes using free and clear laundry detergents can be helpful. Fabric softener sheets should be avoided. Downy Free & Gentle liquid, or any liquid fabric softener that is free of dyes and perfumes, it acceptable to use  9. If your doctor has given you prescription creams you may apply moisturizers over them      Due to recent changes in healthcare laws, you may see results of your pathology and/or laboratory studies on MyChart before the doctors have had a chance to review them. We understand that in some cases there may be results that are confusing or concerning to you. Please understand that not all results are received at the same time and often the doctors may need to interpret multiple results in order to provide you with the best plan of care or course of treatment. Therefore, we ask that you please give [place] 2 business days to thoroughly review all your results before contacting the office for clarification. Should we see a critical lab result, you will be contacted sooner.   If You Need Anything After Your Visit  If you have any questions or concerns for your doctor, please call our main line at [phone] and  press option 4 to reach your doctor's medical assistant. If no one answers, please leave a voicemail as directed and we will return your call as soon as possible. Messages left after 4 pm will be answered the following business day.   You may also send [place] a message via MyChart. We typically respond to MyChart messages within 1-2 business days.  For prescription refills, please ask your pharmacy to contact our  office. Our fax number is [phone].  If you have an urgent issue when the clinic is closed that cannot wait until the next business day, you can page your doctor at the number below.    Please note that while we do our best to be available for urgent issues outside of office hours, we are not available 24/7.   If you have an urgent issue and are unable to reach [place], you may choose to seek medical care at your doctor's office, retail clinic, urgent care center, or emergency room.  If you have a medical emergency, please immediately call 911 or go to the emergency department.  Pager Numbers  - Dr. Gwen Pounds: [phone]  - Dr. Roseanne Reno: [phone]  - Dr. Katrinka Blazing: [phone]   In the event of inclement weather, please call our main line at [phone] for an update on the status of any delays or closures.  Dermatology Medication Tips: Please keep the boxes that topical medications come in in order to help keep track of the instructions about where and how to use these. Pharmacies typically print the medication instructions only on the boxes and not directly on the medication tubes.   If your medication is too expensive, please contact our office at [phone] option 4 or send [place] a message through MyChart.   We are unable to tell what your co-pay for medications will be in advance as this is different depending on your insurance coverage. However, we may be able to find a substitute medication at lower cost or fill out paperwork to get insurance to cover a needed medication.   If a prior authorization is required to get your medication covered by your insurance company, please allow [place] 1-2 business days to complete this process.  Drug prices often vary depending on where the prescription is filled and some pharmacies may offer cheaper prices.  The website www.goodrx.com contains coupons for medications through different pharmacies. The prices here do not account for what the cost  may be with help from insurance (it may be cheaper with your insurance), but the website can give you the price if you did not use any insurance.  - You can print the associated coupon and take it with your prescription to the pharmacy.  - You may also stop by our office during regular business hours and pick up a GoodRx coupon card.  - If you need your prescription sent electronically to a different pharmacy, notify our office through [facility] or by phone at [phone] option 4.     Si Usted Necesita Algo Despus de Su Visita  Tambin puede enviarnos un mensaje a travs de Clinical cytogeneticist. Por lo general respondemos a los mensajes de MyChart en el transcurso de 1 a 2 das hbiles.  Para renovar recetas, por favor pida a su farmacia que se ponga en contacto con nuestra oficina. Annie Sable de fax es [place] [phone].  Si tiene un asunto urgente cuando la clnica est cerrada y que no puede esperar hasta el siguiente da hbil, puede llamar/localizar a su doctor(a) al  nmero que aparece a continuacin.   Por favor, tenga en cuenta que aunque hacemos todo lo posible para estar disponibles para asuntos urgentes fuera del horario de [place], no estamos disponibles las 24 horas del da, los 7 [address]  [address] de la [place].   Si tiene un problema urgente y no puede comunicarse con nosotros, puede optar por buscar atencin mdica  en el consultorio de su doctor(a), en una clnica privada, en un centro de atencin urgente o en una sala de emergencias.  Si tiene Engineer, drilling, por favor llame inmediatamente al 911 o vaya a la sala de emergencias.  Nmeros de bper  - Dr. Gwen Pounds: [phone]  - Dra. Roseanne Reno: [phone]  - Dr. Katrinka Blazing: [phone]   En caso de inclemencias del tiempo, por favor llame a Lacy Duverney principal al [phone] para una actualizacin sobre el [place] de cualquier retraso o cierre.  Consejos para la medicacin en dermatologa: Por favor, guarde las cajas en  las que vienen los medicamentos de uso tpico para ayudarle a seguir las instrucciones sobre dnde y cmo usarlos. Las farmacias generalmente imprimen las instrucciones del medicamento slo en las cajas y no directamente en los tubos del [place].   Si su medicamento es muy caro, por favor, pngase en contacto con Rolm Gala llamando al [phone] y presione la opcin 4 o envenos un mensaje a travs de Clinical cytogeneticist.   No podemos decirle cul ser su copago por los medicamentos por adelantado ya que esto es diferente dependiendo de la cobertura de su seguro. Sin embargo, es posible que podamos encontrar un medicamento sustituto a Audiological scientist un formulario para que el seguro cubra el medicamento que se considera necesario.   Si se requiere una autorizacin previa para que su compaa de seguros [place] su medicamento, por favor permtanos de 1 a 2 das hbiles para completar [address]  Los precios de los medicamentos varan con frecuencia dependiendo del Environmental consultant de dnde se surte la receta y alguna farmacias pueden ofrecer precios ms baratos.  El sitio web www.goodrx.com tiene cupones para medicamentos de Health and safety inspector. Los precios aqu no tienen en cuenta lo que podra costar con la ayuda del seguro (puede ser ms barato con su seguro), pero el sitio web puede darle el precio si no utiliz Tourist information centre manager.  - Puede imprimir el cupn correspondiente y llevarlo con su receta a la farmacia.  - Tambin puede pasar por nuestra oficina durante el horario de atencin regular y Education officer, museum una tarjeta de cupones de GoodRx.  - Si necesita que su receta se enve electrnicamente a una farmacia diferente, informe a nuestra oficina a travs de MyChart de [place] o por telfono llamando al [phone] y presione la opcin 4.

## 2023-09-04 ENCOUNTER — Telehealth: Payer: Self-pay

## 2023-09-04 NOTE — Telephone Encounter (Signed)
Patient's insurance is going to require trail and failure of Pimecrolimus Cream or Tacrolimus Ointment for Dupixent PA.

## 2023-09-08 MED ORDER — PIMECROLIMUS 1 % EX CREA: TOPICAL_CREAM | Freq: Two times a day (BID) | CUTANEOUS | 1 refills | Status: DC

## 2023-09-08 NOTE — Telephone Encounter (Signed)
Patient advised and RX sent in. aw 

## 2023-09-11 ENCOUNTER — Ambulatory Visit: Payer: MEDICAID

## 2023-09-11 DIAGNOSIS — L209 Atopic dermatitis, unspecified: Secondary | ICD-10-CM | POA: Diagnosis not present

## 2023-09-11 NOTE — Progress Notes (Signed)
Patient here today for Dupixent injection for severe atopic dermatitis.  Dupixent Pen 300mg /84mL injected into left upper arm. Patient tolerated well.  LOT: [id number] EXP: 03/17/2025  Dorathy Daft, RMA

## 2023-09-22 NOTE — Progress Notes (Unsigned)
Cardiology Office Note:  .   Date:  09/23/2023  ID:  Luke Boatman., DOB 02-Apr-1961, MRN [id number] PCP: Marjie Skiff, NP  [facility] Providers Cardiologist:  Debbe Odea, MD    History of Present Illness: .   Luke Briggs. is a 62 y.o. male with a past medical history of aortic atherosclerosis, carotid stenosis followed by [facility], essential hypertension, GERD, tobacco abuse, heavy EtOH abuse, osteoarthritis, BPH, chronic pain syndrome, schizophrenia, mixed hyperlipidemia, who is here to follow-up.  Echocardiogram completed in 10/2019 showed normal systolic and diastolic function.  Lexiscan Myoview completed 01/24/21 showed no evidence of ischemia.  Lexiscan Myoview done 07/2023 showed low risk study with no evidence of ischemia.  Echocardiogram completed on 07/2023 revealed an LVEF of 50-55%, no RWMA, G1 DD, mild mitral regurgitation.  He was last seen in clinic 08/18/2023 after recent visit to the emergency department for abnormal labs.  He had various complaints of fatigue, shortness of breath, lightheadedness/dizziness, rash and low sodium.  He had continues to drink about 8-12 beers daily and was advised to increase his sodium intake as his sodium was low.  He was changed from olmesartan 40 mg daily to losartan 100 mg daily with repeat BMP in 2 weeks.  He was encouraged to continue to monitor his blood pressures at home as well.  Primary care provider had also ordered a chest CT to rule out PE for possible anginal equivalent.  He returns to clinic today stating that he has made some medication changes on his own.  He had previously been off of olmesartan HCTZ due to hypokalemia erythremia was started on losartan.  He followed up with dermatology.  Due to continued atopic dermatitis was started on Dupixent and some cream and was found to have blood pressures of over 200.  They advised him to go to the emergency department which he declined.  He had stopped taking the  losartan and started taking a half a dose of his olmesartan HCTZ 20/12.5 mg at bedtime and noted improvement in his blood pressure at home.  He continues to drink daily and has been advised that alcohol is not good for his blood pressure as well.  Previously had been scheduled for labs which she did not have completed.  He denies any chest pain, palpitations, chest pressure.  Continues to endorse chronic dyspnea on exertion and smoking a pack to a pack and half per day.  States that he has been compliant with the remainder of his medications.  He also denies any recent hospitalizations or visits to the emergency department.  ROS: 10 point review of systems has been reviewed and considered negative with exception of what is been listed in the HPI  Studies Reviewed: [name]        TTE 08/12/23 1. Left ventricular ejection fraction, by estimation, is 50 to 55%. Left  ventricular ejection fraction by 3D volume is 51 %. The left ventricle has  low normal function. The left ventricle has no regional wall motion  abnormalities. Left ventricular  diastolic parameters are consistent with Grade I diastolic dysfunction  (impaired relaxation). The average left ventricular global longitudinal  strain is -11.0 %.   2. Right ventricular systolic function is normal. The right ventricular  size is normal. There is normal pulmonary artery systolic pressure. The  estimated right ventricular systolic pressure is 21.0 mmHg.   3. The mitral valve is normal in structure. Mild mitral valve  regurgitation. No evidence of mitral  stenosis.   4. The aortic valve is tricuspid. Aortic valve regurgitation is not  visualized. No aortic stenosis is present.   5. The inferior vena cava is normal in size with greater than 50%  respiratory variability, suggesting right atrial pressure of 3 mmHg.    Lexiscan MPI 07/28/23   The study is normal. The study is low risk.   No ST deviation was noted.   LV perfusion is normal. There is no  evidence of ischemia. There is no evidence of infarction.   Left ventricular function is normal. End diastolic cavity size is normal. End systolic cavity size is normal.   CT attenuation images showed minimal aortic and coronary calcifications.   Lexiscan MPI 01/24/2021 Narrative & Impression  T wave inversion was noted during stress in the V4, V5, V6 and II leads. There was no ST segment deviation noted during stress. The study is normal. This is a low risk study. The left ventricular ejection fraction is normal (55-65%). CT attenuation images showed no significant aortic or coronary calcifications.    This result has not been signed. Information might be incomplete.    TTE 11/18/2019 1. Left ventricular ejection fraction, by visual estimation, is 60 to  65%. The left ventricle has normal function. There is no left ventricular  hypertrophy.   2. The left ventricle has no regional wall motion abnormalities.   3. Global right ventricle has normal systolic function.The right  ventricular size is normal. No increase in right ventricular wall  thickness.   4. Left atrial size was normal.   5. Right atrial size was normal.   6. The mitral valve is normal in structure. No evidence of mitral valve  regurgitation. No evidence of mitral stenosis.   7. The tricuspid valve is normal in structure.   8. The aortic valve was not well visualized. Aortic valve regurgitation  is not visualized. No evidence of aortic valve sclerosis or stenosis.   9. The pulmonic valve was not well visualized. Pulmonic valve  regurgitation is not visualized.  10. Normal pulmonary artery systolic pressure.  11. The inferior vena cava is normal in size with greater than 50%  respiratory variability, suggesting right atrial pressure of 3 mmHg.  Risk Assessment/Calculations:     HYPERTENSION CONTROL Vitals:   09/23/23 0757 09/23/23 0810  BP: (!) 156/93 (!) 158/90    The patient's blood pressure is elevated above  target today.  In order to address the patient's elevated BP: A current anti-hypertensive medication was adjusted today.          Physical Exam:   VS:  BP (!) 158/90 (BP Location: Left Arm, Patient Position: Sitting, Cuff Size: Normal)   Pulse 92   Ht 5\' 10"  (1.778 m)   Wt 187 lb 9.6 oz (85.1 kg)   SpO2 96%   BMI 26.92 kg/m    Wt Readings from Last 3 Encounters:  09/23/23 187 lb 9.6 oz (85.1 kg)  08/18/23 188 lb 6.4 oz (85.5 kg)  08/14/23 188 lb (85.3 kg)    GEN: Well nourished, well developed in no acute distress NECK: No JVD; No carotid bruits CARDIAC: RRR, no murmurs, rubs, gallops RESPIRATORY:  Clear to auscultation without rales, wheezing or rhonchi  ABDOMEN: Soft, non-tender, non-distended EXTREMITIES:  No edema; No deformity   ASSESSMENT AND PLAN: .   Hypertension with blood pressure today 158/90 and 156/93.  He is taking his self off of losartan and put his self back on olmesartan HCTZ 20/12.5 mg  daily.  With blood pressure continue to be elevated in the continue amount of alcohol that he continues to drink we are adding an additional 10 mg of olmesartan to his a.m. regimen which she is willing to try.  With longstanding history of hyponatremia with his alcoholism we are not adding a more HCTZ to his current medication regimen.  He has been sent for BMP today to recheck his sodium level.  Chronic shortness of breath and dyspnea on exertion that is unchanged.  Last echocardiogram revealed LVEF 50-55%, no RWMA, G1 DD, mild MR.  No further testing needed at this time.  Previous CTA of the chest negative for PE.  Palpitations which are less intense.  He is encouraged to decrease his caffeine intake.  Previously had been on carvedilol 3.125 mg twice daily which she states he is no longer taking.  States that the palpitations are not as frequent as they were prior and has no desire to restart taking the medication.  Advised that this carvedilol did help with his blood pressure he is  not interested in any additional medication other than what he is taking at this time.  Chronic hyponatremia which has been scheduled for repeat BMP today.  Previously had tried to take him off HCTZ and that he started taking that medication and HCTZ at home.  Mixed hyperlipidemia with an LDL of 83 that has improved since being changed off of atorvastatin to rosuvastatin.  Peripheral arterial disease with bilateral carotid stenosis and continues to be followed by [facility].  He is encouraged to continue with the statin therapy as well.  States that chronic duplex was previously ordered by [facility] follow-up with him for study results.  Tobacco and alcohol abuse with no desire to quit at this time.  Total cessation is recommended.       Dispo: Patient to return to clinic to see MD/APP in 3 months or sooner if needed  Signed, Hadasa Gasner, NP

## 2023-09-23 ENCOUNTER — Ambulatory Visit: Payer: MEDICAID | Attending: Cardiology | Admitting: Cardiology

## 2023-09-23 ENCOUNTER — Encounter: Payer: Self-pay | Admitting: Cardiology

## 2023-09-23 ENCOUNTER — Other Ambulatory Visit: Payer: Self-pay | Admitting: *Deleted

## 2023-09-23 VITALS — BP 158/90 | HR 92 | Ht 70.0 in | Wt 187.6 lb

## 2023-09-23 DIAGNOSIS — F10288 Alcohol dependence with other alcohol-induced disorder: Secondary | ICD-10-CM

## 2023-09-23 DIAGNOSIS — I739 Peripheral vascular disease, unspecified: Secondary | ICD-10-CM | POA: Diagnosis not present

## 2023-09-23 DIAGNOSIS — I10 Essential (primary) hypertension: Secondary | ICD-10-CM | POA: Diagnosis not present

## 2023-09-23 DIAGNOSIS — E871 Hypo-osmolality and hyponatremia: Secondary | ICD-10-CM | POA: Diagnosis not present

## 2023-09-23 DIAGNOSIS — R0602 Shortness of breath: Secondary | ICD-10-CM

## 2023-09-23 DIAGNOSIS — I6523 Occlusion and stenosis of bilateral carotid arteries: Secondary | ICD-10-CM

## 2023-09-23 DIAGNOSIS — E782 Mixed hyperlipidemia: Secondary | ICD-10-CM

## 2023-09-23 DIAGNOSIS — R002 Palpitations: Secondary | ICD-10-CM

## 2023-09-23 DIAGNOSIS — F17210 Nicotine dependence, cigarettes, uncomplicated: Secondary | ICD-10-CM

## 2023-09-23 MED ORDER — OLMESARTAN MEDOXOMIL 20 MG PO TABS: 10.0000 mg | ORAL_TABLET | Freq: Every day | ORAL | 3 refills | Status: DC

## 2023-09-23 MED ORDER — OLMESARTAN MEDOXOMIL-HCTZ 40-25 MG PO TABS: 0.5000 | ORAL_TABLET | Freq: Every day | ORAL | 4 refills | Status: DC

## 2023-09-23 NOTE — Patient Instructions (Addendum)
Medication Instructions:  Your physician has recommended you make the following change in your medication:   Olmesartan-Hydrochlorothiazide take 1/2 tablet in the evening Take Olmesartan 1/2 tablet in the morning 10 mg in the morning  *If you need a refill on your cardiac medications before your next appointment, please call your pharmacy*   Lab Work: [facility] today here in the office.  If you have labs (blood work) drawn today and your tests are completely normal, you will receive your results only by: MyChart Message (if you have MyChart) OR A paper copy in the mail If you have any lab test that is abnormal or we need to change your treatment, we will call you to review the results.   Testing/Procedures: None   Follow-Up: At [facility], you and your health needs are our priority.  As part of our continuing mission to provide you with exceptional heart care, we have created designated Provider Care Teams.  These Care Teams include your primary Cardiologist (physician) and Advanced Practice Providers (APPs -  Physician Assistants and Nurse Practitioners) who all work together to provide you with the care you need, when you need it.   Your next appointment:   3 month(s)  Provider:   Debbe Odea, MD or Charlsie Quest, NP

## 2023-09-24 LAB — BASIC METABOLIC PANEL
BUN/Creatinine Ratio: 16 (ref 10–24)
BUN: 18 mg/dL (ref 8–27)
CO2: 24 mmol/L (ref 20–29)
Calcium: 9.3 mg/dL (ref 8.6–10.2)
Chloride: 98 mmol/L (ref 96–106)
Creatinine, Ser: 1.12 mg/dL (ref 0.76–1.27)
Glucose: 112 mg/dL — ABNORMAL HIGH (ref 70–99)
Potassium: 3.8 mmol/L (ref 3.5–5.2)
Sodium: 139 mmol/L (ref 134–144)
eGFR: 75 mL/min/{1.73_m2} (ref >59)

## 2023-09-25 ENCOUNTER — Other Ambulatory Visit: Payer: Self-pay | Admitting: Acute Care

## 2023-09-25 DIAGNOSIS — Z122 Encounter for screening for malignant neoplasm of respiratory organs: Secondary | ICD-10-CM

## 2023-09-25 DIAGNOSIS — F17210 Nicotine dependence, cigarettes, uncomplicated: Secondary | ICD-10-CM

## 2023-09-25 DIAGNOSIS — Z87891 Personal history of nicotine dependence: Secondary | ICD-10-CM

## 2023-09-25 NOTE — Progress Notes (Signed)
Kidney function has remained stable.  Continue current medication regimen without changes at this time.

## 2023-09-29 ENCOUNTER — Encounter: Payer: Self-pay | Admitting: Dermatology

## 2023-09-29 ENCOUNTER — Ambulatory Visit: Payer: MEDICAID | Admitting: Dermatology

## 2023-09-29 DIAGNOSIS — Z79899 Other long term (current) drug therapy: Secondary | ICD-10-CM

## 2023-09-29 DIAGNOSIS — L209 Atopic dermatitis, unspecified: Secondary | ICD-10-CM | POA: Diagnosis not present

## 2023-09-29 DIAGNOSIS — Z7189 Other specified counseling: Secondary | ICD-10-CM

## 2023-09-29 MED ORDER — DUPILUMAB 300 MG/2ML ~~LOC~~ SOSY
300.0000 mg | PREFILLED_SYRINGE | SUBCUTANEOUS | Status: AC
Start: 2023-09-29 — End: 2023-11-24
  Administered 2023-10-13 – 2023-10-27 (×2): 300 mg via SUBCUTANEOUS

## 2023-09-29 MED ORDER — ELIDEL 1 % EX CREA: TOPICAL_CREAM | Freq: Two times a day (BID) | CUTANEOUS | 0 refills | Status: DC

## 2023-09-29 MED ORDER — DUPILUMAB 300 MG/2ML ~~LOC~~ SOSY
300.0000 mg | PREFILLED_SYRINGE | Freq: Once | SUBCUTANEOUS | Status: AC
Start: 2023-09-29 — End: 2023-09-29
  Administered 2023-09-29: 300 mg via SUBCUTANEOUS

## 2023-09-29 NOTE — Patient Instructions (Signed)

## 2023-09-29 NOTE — Progress Notes (Signed)
   Follow-Up Visit   Subjective  Luke Briggs. is a 62 y.o. male who presents for the following: atopic dermatitis - improved with Dupixent injections, pt reports no s/e from medication. Topically he is using TMC 0.1% cream, and Clobetasol 0.05% cream BID. Pt states that Elidel was not covered by insurance so he was unable to pick it up from the pharmacy.   The following portions of the chart were reviewed this encounter and updated as appropriate: medications, allergies, medical history  Review of Systems:  No other skin or systemic complaints except as noted in HPI or Assessment and Plan.  Objective  Well appearing patient in no apparent distress; mood and affect are within normal limits.    A focused examination was performed of the following areas: the face, hands, trunk, and arms   Relevant exam findings are noted in the Assessment and Plan.    Assessment & Plan   ATOPIC DERMATITIS - severe, but significantly improved on Dupixent injections.   Exam: Clear on arms and back and hands. Patient reports no rash elsewhere  0% BSA on Dupixent, previous BSA 20%  Chronic and persistent condition with duration or expected duration over one year. Condition is improving with treatment but not currently at goal.  Atopic dermatitis (eczema) is a chronic, relapsing, pruritic condition that can significantly affect quality of life. It is often associated with allergic rhinitis and/or asthma and can require treatment with topical medications, phototherapy, or in severe cases biologic injectable medication (Dupixent; Adbry) or Oral JAK inhibitors.  Treatment Plan: Continue Dupixent 300mg /67mL SQ QOW. Dupilumab (Dupixent) is a treatment given by injection for adults and children with moderate-to-severe atopic dermatitis. Goal is control of skin condition, not cure. Patient reports no side effects  Potential side effects include allergic reaction, herpes infections, injection site  reactions and conjunctivitis (inflammation of the eyes).  The use of Dupixent requires long term medication management, including periodic office visits.  Dupixent 300mg /52mL injected SQ into the R upper arm post. Pt tolerated injection well. AL, CMA, AAMA  Pimecrolimus sent to pharmacy, but patient states was not covered by insurance so we will do a prior authorization today, and let patient know when approved by his insurance.  Start Elidel 1% cream (Pimecrolimus) to aa's BID once prior authorization is approved.   Continue TMC 0.1% cream to aa's BID PRN, and Clobetasol 0.05% cream to aa's hands BID PRN. . Topical steroids (such as triamcinolone, fluocinolone, fluocinonide, mometasone, clobetasol, halobetasol, betamethasone, hydrocortisone) can cause thinning and lightening of the skin if they are used for too long in the same area. Your physician has selected the right strength medicine for your problem and area affected on the body. Please use your medication only as directed by your physician to prevent side effects.   Recommend gentle skin care  Atopic dermatitis, unspecified type  Related Medications dupilumab (DUPIXENT) prefilled syringe 300 mg   dupilumab (DUPIXENT) prefilled syringe 300 mg     Return in about 2 weeks (around 10/13/2023) for nurse visit - dupixent injection; 3 mths follow up with Dr. Katrinka Blazing.  Maylene Roes, CMA, am acting as scribe for Elie Goody, MD .   Documentation: I have reviewed the above documentation for accuracy and completeness, and I agree with the above.  Elie Goody, MD

## 2023-10-13 ENCOUNTER — Ambulatory Visit: Payer: MEDICAID

## 2023-10-13 DIAGNOSIS — L209 Atopic dermatitis, unspecified: Secondary | ICD-10-CM

## 2023-10-13 NOTE — Progress Notes (Signed)
Patient here today for Dupixent injection for severe atopic dermatitis.   Dupixent Pen 300mg /31mL injected into left upper arm. Patient tolerated well.   LOT: [id number] EXP: 06/18/2025   Dorathy Daft, RMA

## 2023-10-23 ENCOUNTER — Telehealth: Payer: Self-pay | Admitting: Neurosurgery

## 2023-10-23 DIAGNOSIS — M79642 Pain in left hand: Secondary | ICD-10-CM

## 2023-10-23 NOTE — Telephone Encounter (Signed)
08/21/22 ACDF C5-C6  Last visit on 11/19/2022 "I offered him referral to orthopedics for evaluation of his left thumb and hand, but he declined." He is calling back that he would like that referral now. He is still having issues with his hand.  Do you need to see him to place the referral?

## 2023-10-24 ENCOUNTER — Other Ambulatory Visit: Payer: Self-pay | Admitting: *Deleted

## 2023-10-24 MED ORDER — TADALAFIL 5 MG PO TABS: ORAL_TABLET | ORAL | 3 refills | Status: AC

## 2023-10-24 NOTE — Telephone Encounter (Signed)
Referral to Ortho has been placed.  

## 2023-10-24 NOTE — Telephone Encounter (Signed)
[facility]

## 2023-10-24 NOTE — Telephone Encounter (Signed)
Left message for patient that the referral was sent to [facility].

## 2023-10-27 ENCOUNTER — Ambulatory Visit (INDEPENDENT_AMBULATORY_CARE_PROVIDER_SITE_OTHER): Payer: MEDICAID

## 2023-10-27 ENCOUNTER — Ambulatory Visit: Payer: MEDICAID

## 2023-10-27 DIAGNOSIS — L209 Atopic dermatitis, unspecified: Secondary | ICD-10-CM

## 2023-10-27 NOTE — Progress Notes (Signed)
Patient here today for Dupixent injection for severe atopic dermatitis.   Dupixent Pen 300mg /38mL injected into right upper arm. Patient tolerated well.   LOT: [id number] EXP: 09/18/2025   Dorathy Daft, RMA

## 2023-11-17 ENCOUNTER — Ambulatory Visit: Payer: MEDICAID

## 2023-11-17 DIAGNOSIS — L209 Atopic dermatitis, unspecified: Secondary | ICD-10-CM

## 2023-11-17 MED ORDER — DUPILUMAB 300 MG/2ML ~~LOC~~ SOSY
300.0000 mg | PREFILLED_SYRINGE | Freq: Once | SUBCUTANEOUS | Status: AC
Start: 2023-11-17 — End: 2023-11-17
  Administered 2023-11-17: 300 mg via SUBCUTANEOUS

## 2023-11-17 NOTE — Patient Instructions (Signed)

## 2023-11-17 NOTE — Progress Notes (Signed)
Patient here today for Dupixent injection for severe atopic dermatitis.   Dupixent syringe 300mg /106mL injected into left upper arm. Patient tolerated well.  Lot number: [id number] Expiration date: 06/2025  Cari Caraway CMA, AAMA

## 2023-12-01 ENCOUNTER — Telehealth: Payer: Self-pay

## 2023-12-01 ENCOUNTER — Ambulatory Visit (INDEPENDENT_AMBULATORY_CARE_PROVIDER_SITE_OTHER): Payer: MEDICAID

## 2023-12-01 DIAGNOSIS — L209 Atopic dermatitis, unspecified: Secondary | ICD-10-CM

## 2023-12-01 MED ORDER — DUPILUMAB 300 MG/2ML ~~LOC~~ SOAJ
300.0000 mg | Freq: Once | SUBCUTANEOUS | Status: AC
Start: 2023-12-01 — End: 2023-12-01
  Administered 2023-12-01: 300 mg via SUBCUTANEOUS

## 2023-12-01 NOTE — Telephone Encounter (Signed)
 I need to enter orders for patient's Dupixent injection in office today. Okay to enter?  Also patient has been approved. Once pharmacy runs benefits again, he plans on self injecting at home with significant other. aw

## 2023-12-01 NOTE — Progress Notes (Signed)
 Patient here today for Dupixent  injection for severe atopic dermatitis.   Dupixent  Pen 300mg /54mL injected into right upper arm. Patient tolerated well.  Sent message to [facility] pharmacy for them to re run prescription with approval on file.    LOT: [id number] EXP: 07/18/2025   Alan Pizza, RMA

## 2023-12-15 ENCOUNTER — Ambulatory Visit: Payer: MEDICAID

## 2023-12-18 ENCOUNTER — Telehealth: Payer: Self-pay | Admitting: Cardiology

## 2023-12-18 NOTE — Telephone Encounter (Signed)
Patient called to cancel their appointment on 12/24/23 with NP Sherri Hammock. Patient wanted to inform NP Frutoso Schatz that the medications are working good for him. Please advise.

## 2023-12-18 NOTE — Telephone Encounter (Signed)
Returned call to the patient. The patient requested to reschedule his appointment on 12/22/23 due to transportation issues. He mentioned that his significant other works the third shift and brings him to all his appointments. The appointment has been rescheduled for 12/29/23 at 8:25 a.m.

## 2023-12-21 NOTE — Patient Instructions (Addendum)
 Call pulmonary and schedule to see them -- [phone]  Fatigue If you have fatigue, you feel tired all the time and have a lack of energy or a lack of motivation. Fatigue may make it difficult to start or complete tasks because of exhaustion. Occasional or mild fatigue is often a normal response to activity or life. However, long-term (chronic) or extreme fatigue may be a symptom of a medical condition such as: Depression. Not having enough red blood cells or hemoglobin in the blood (anemia). A problem with a small gland located in the lower front part of the neck (thyroid disorder). Rheumatologic conditions. These are problems related to the body's defense system (immune system). Infections, especially certain viral infections. Fatigue can also lead to negative health outcomes over time. Follow these instructions at home: Medicines Take over-the-counter and prescription medicines only as told by your health care provider. Take a multivitamin if told by your health care provider. Do not use herbal or dietary supplements unless they are approved by your health care provider. Eating and drinking  Avoid heavy meals in the evening. Eat a well-balanced diet, which includes lean proteins, whole grains, plenty of fruits and vegetables, and low-fat dairy products. Avoid eating or drinking too many products with caffeine in them. Avoid alcohol. Drink enough fluid to keep your urine pale yellow. Activity  Exercise regularly, as told by your health care provider. Use or practice techniques to help you relax, such as yoga, tai chi, meditation, or massage therapy. Lifestyle Change situations that cause you stress. Try to keep your work and personal schedules in balance. Do not use recreational or illegal drugs. General instructions Monitor your fatigue for any changes. Go to bed and get up at the same time every day. Avoid fatigue by pacing yourself during the day and getting enough sleep at  night. Maintain a healthy weight. Contact a health care provider if: Your fatigue does not get better. You have a fever. You suddenly lose or gain weight. You have headaches. You have trouble falling asleep or sleeping through the night. You feel angry, guilty, anxious, or sad. You have swelling in your legs or another part of your body. Get help right away if: You feel confused, feel like you might faint, or faint. Your vision is blurry or you have a severe headache. You have severe pain in your abdomen, your back, or the area between your waist and hips (pelvis). You have chest pain, shortness of breath, or an irregular or fast heartbeat. You are unable to urinate, or you urinate less than normal. You have abnormal bleeding from the rectum, nose, lungs, nipples, or, if you are male, the vagina. You vomit blood. You have thoughts about hurting yourself or others. These symptoms may be an emergency. Get help right away. Call 911. Do not wait to see if the symptoms will go away. Do not drive yourself to the hospital. Get help right away if you feel like you may hurt yourself or others, or have thoughts about taking your own life. Go to your nearest emergency room or: Call 911. Call the National Suicide Prevention Lifeline at 361-463-6634 or 988. This is open 24 hours a day. Text the Crisis Text Line at 901-004-1967. Summary If you have fatigue, you feel tired all the time and have a lack of energy or a lack of motivation. Fatigue may make it difficult to start or complete tasks because of exhaustion. Long-term (chronic) or extreme fatigue may be a symptom of a  medical condition. Exercise regularly, as told by your health care provider. Change situations that cause you stress. Try to keep your work and personal schedules in balance. This information is not intended to replace advice given to you by your health care provider. Make sure you discuss any questions you have with your health  care provider. Document Revised: 08/27/2021 Document Reviewed: 08/27/2021 Elsevier Patient Education  2024 [employer].

## 2023-12-24 ENCOUNTER — Encounter: Payer: Self-pay | Admitting: Nurse Practitioner

## 2023-12-24 ENCOUNTER — Ambulatory Visit: Payer: MEDICAID | Admitting: Cardiology

## 2023-12-24 ENCOUNTER — Ambulatory Visit (INDEPENDENT_AMBULATORY_CARE_PROVIDER_SITE_OTHER): Payer: MEDICAID | Admitting: Nurse Practitioner

## 2023-12-24 VITALS — BP 142/88 | HR 73 | Temp 97.7°F | Ht 70.0 in | Wt 191.2 lb

## 2023-12-24 DIAGNOSIS — F209 Schizophrenia, unspecified: Secondary | ICD-10-CM | POA: Diagnosis not present

## 2023-12-24 DIAGNOSIS — F10288 Alcohol dependence with other alcohol-induced disorder: Secondary | ICD-10-CM

## 2023-12-24 DIAGNOSIS — I739 Peripheral vascular disease, unspecified: Secondary | ICD-10-CM

## 2023-12-24 DIAGNOSIS — F4312 Post-traumatic stress disorder, chronic: Secondary | ICD-10-CM

## 2023-12-24 DIAGNOSIS — F17210 Nicotine dependence, cigarettes, uncomplicated: Secondary | ICD-10-CM

## 2023-12-24 DIAGNOSIS — F1111 Opioid abuse, in remission: Secondary | ICD-10-CM

## 2023-12-24 DIAGNOSIS — J438 Other emphysema: Secondary | ICD-10-CM

## 2023-12-24 DIAGNOSIS — I10 Essential (primary) hypertension: Secondary | ICD-10-CM

## 2023-12-24 DIAGNOSIS — I700 Atherosclerosis of aorta: Secondary | ICD-10-CM | POA: Diagnosis not present

## 2023-12-24 DIAGNOSIS — E871 Hypo-osmolality and hyponatremia: Secondary | ICD-10-CM

## 2023-12-24 DIAGNOSIS — E782 Mixed hyperlipidemia: Secondary | ICD-10-CM

## 2023-12-24 NOTE — Assessment & Plan Note (Signed)
 Chronic and ongoing due to heavier alcohol intake.  Highly recommend he cut back on this.  Also drinks a lot of water with this.  Check level today.

## 2023-12-24 NOTE — Assessment & Plan Note (Signed)
Chronic, ongoing.  No current medications.  Tolerating this and does not wish to start medication.  Could consider Seroquel in future if needed or return to Clonidine.  Denies SI/HI.

## 2023-12-24 NOTE — Assessment & Plan Note (Signed)
 Chronic, ongoing.  Continue to collaborate with pulmonary, highly recommend he return to them for visit and repeat lung testing due to his increased SOB and fatigue -- overall cardiac work-up has been reassuring.  Continue Breztri  BID and Albuterol  as needed at this time.  Continue yearly lung screening.  Recommend complete cessation of smoking.

## 2023-12-24 NOTE — Assessment & Plan Note (Signed)
I have recommended complete cessation of tobacco use. I have discussed various options available for assistance with tobacco cessation including over the counter methods (Nicotine gum, patch and lozenges). We also discussed prescription options (Chantix, Nicotine Inhaler / Nasal Spray). The patient is not interested in pursuing any prescription tobacco cessation options at this time.  Continue yearly lung CA screening.

## 2023-12-24 NOTE — Progress Notes (Signed)
 BP (!) 142/88 (BP Location: Left Arm, Patient Position: Sitting, Cuff Size: Normal)   Pulse 73   Temp 97.7 F (36.5 C) (Oral)   Ht 5' 10 (1.778 m)   Wt 191 lb 3.2 oz (86.7 kg)   SpO2 96%   BMI 27.43 kg/m    Subjective:    Patient ID: Luke Briggs Luke Briggs., male    DOB: 1961-03-25, 63 y.o.   MRN: [id number]  HPI: [name]. is a 63 y.o. male  Chief Complaint  Patient presents with   Fatigue    Patient states he has been feeling fatigued and tired for the last month. States he feels this way constantly.    HYPERTENSION without Chronic Kidney Disease (has not taken medication this morning) Taking Olmesartan /HCTZ 20/12.5 MG + 10 MG of Olmesartan  (to equal total daily of 30 MG -- however he is taking 20 MG to equal 40 MG daily).  Last saw cardiology 09/23/23 when they increased his Olmesartan  to 30 MG.  At the visit he reported he had stopped Losartan  and restarted Olmesartan .  Last echo noted EF 50-55% and G1 DD on 08/12/23.  Has not been taking Rosuvastatin , did not realize it was for cholesterol, he forgot.   Continues to drink alcohol nightly -- 8-12 Natural Lights a day, this is increased from previous 6-8. Continues to report drinking helps his tinnitus.  States he feels worse if he does not drink.  4 beers and his ears stop ringing and he has more energy.  Continue to smoke 1 to 1 1/2 PPD.  Has smoked for >45 years. Hypertension status: uncontrolled  Satisfied with current treatment? yes Duration of hypertension: chronic BP monitoring frequency:  not checking BP range:  BP medication side effects:  no Medication compliance: good compliance Aspirin : no Recurrent headaches: no Visual changes: no Palpitations: no Dyspnea: no Chest pain: no Lower extremity edema: no Dizzy/lightheaded: on occasion at baseline  The 10-year ASCVD risk score (Arnett DK, et al., 2019) is: 13.2%   Values used to calculate the score:     Age: 67 years     Sex: Male     Is Non-Hispanic  African American: No     Diabetic: No     Tobacco smoker: Yes     Systolic Blood Pressure: 142 mmHg     Is BP treated: Yes     HDL Cholesterol: 72 mg/dL     Total Cholesterol: 178 mg/dL   FATIGUE Reports ongoing fatigue which we have discussed at past visits.  Has history of low NA+ on labs.  Continues on Breztri  every day and occasionally uses Albuterol  which does help with his SOB.  Vacuuming makes him tired, this is getting worse and gets SOB with this.    Does have some mood triggers with friend just passing.  Sees GI upcoming, wishes to hol off on colonoscopy. Duration:  chronic Severity: moderate  Onset: gradual Context when symptoms started:  unknown Symptoms improve with rest: no  Depressive symptoms: no Stress/anxiety: no Insomnia: yes hard to fall asleep -- takes Benadryl  Snoring: yes Observed apnea by bed partner: no Daytime hypersomnolence:no Wakes feeling refreshed: no History of sleep study: no Dysnea on exertion:  yes occasional Orthopnea/PND: no Chest pain: no Chronic cough: yes Lower extremity edema: no Arthralgias:no Myalgias: no Weakness: yes Rash: no     12/24/2023    9:03 AM 08/14/2023    8:27 AM 07/25/2023    9:24 AM 07/11/2023  8:52 AM 04/10/2023    8:18 AM  Depression screen PHQ 2/9  Decreased Interest 3 3 3  0 1  Down, Depressed, Hopeless 2 3 3  0 0  PHQ - 2 Score 5 6 6  0 1  Altered sleeping 2 3 3  0 1  Tired, decreased energy 3 3 3 3 3   Change in appetite 1 3 1  0 1  Feeling bad or failure about yourself  2 1 2  0 0  Trouble concentrating 1 3 3 2 2   Moving slowly or fidgety/restless 0 2 3 0 1  Suicidal thoughts 0 0 0 0 0  PHQ-9 Score 14 21 21 5 9   Difficult doing work/chores Very difficult Extremely dIfficult Very difficult Not difficult at all Very difficult       12/24/2023    9:03 AM 08/14/2023    8:27 AM 07/25/2023    9:24 AM 07/11/2023    8:53 AM  GAD 7 : Generalized Anxiety Score  Nervous, Anxious, on Edge 1 2 2  0  Control/stop worrying 2  2 2  0  Worry too much - different things 2 2 2  0  Trouble relaxing 3 2 3 2   Restless 0 1 2 0  Easily annoyed or irritable 1 3 3 3   Afraid - awful might happen 2 3 3  0  Total GAD 7 Score 11 15 17 5   Anxiety Difficulty Somewhat difficult Very difficult Very difficult Not difficult at all   Relevant past medical, surgical, family and social history reviewed and updated as indicated. Interim medical history since our last visit reviewed. Allergies and medications reviewed and updated.  Review of Systems  Constitutional:  Positive for fatigue. Negative for activity change, diaphoresis and fever.  Respiratory:  Positive for cough (chronic) and shortness of breath (with activity sometimes). Negative for chest tightness and wheezing.   Cardiovascular:  Negative for chest pain, palpitations and leg swelling.  Gastrointestinal: Negative.   Musculoskeletal: Negative.   Skin: Negative.   Neurological: Negative.   Psychiatric/Behavioral: Negative.     Per HPI unless specifically indicated above     Objective:    BP (!) 142/88 (BP Location: Left Arm, Patient Position: Sitting, Cuff Size: Normal)   Pulse 73   Temp 97.7 F (36.5 C) (Oral)   Ht 5' 10 (1.778 m)   Wt 191 lb 3.2 oz (86.7 kg)   SpO2 96%   BMI 27.43 kg/m   Wt Readings from Last 3 Encounters:  12/24/23 191 lb 3.2 oz (86.7 kg)  09/23/23 187 lb 9.6 oz (85.1 kg)  08/18/23 188 lb 6.4 oz (85.5 kg)    Physical Exam Vitals and nursing note reviewed.  Constitutional:      General: He is awake. He is not in acute distress.    Appearance: He is well-developed and well-groomed. He is not ill-appearing or toxic-appearing.  HENT:     Head: Normocephalic.     Right Ear: Hearing and external ear normal.     Left Ear: Hearing and external ear normal.  Eyes:     General: Lids are normal. No visual field deficit.    Extraocular Movements: Extraocular movements intact.     Conjunctiva/sclera: Conjunctivae normal.  Neck:     Thyroid: No  thyromegaly.     Vascular: No carotid bruit.  Cardiovascular:     Rate and Rhythm: Normal rate and regular rhythm.     Heart sounds: Normal heart sounds. No murmur heard.    No gallop.  Pulmonary:  Effort: Pulmonary effort is normal. No accessory muscle usage or respiratory distress.     Breath sounds: Wheezing present. No decreased breath sounds or rhonchi.     Comments: Occasional expiratory wheezes noted. Abdominal:     General: Bowel sounds are normal. There is no distension.     Palpations: Abdomen is soft.     Tenderness: There is no abdominal tenderness.  Musculoskeletal:     Cervical back: Full passive range of motion without pain.     Right lower leg: No edema.     Left lower leg: No edema.  Lymphadenopathy:     Cervical: No cervical adenopathy.  Skin:    General: Skin is warm.     Capillary Refill: Capillary refill takes less than 2 seconds.     Findings: No rash.  Neurological:     Mental Status: He is alert and oriented to person, place, and time.     Cranial Nerves: No facial asymmetry.     Sensory: No sensory deficit.     Motor: Tremor (both hands, baseline) present. No weakness.     Coordination: Coordination is intact.     Gait: Gait is intact.     Deep Tendon Reflexes: Reflexes are normal and symmetric.     Reflex Scores:      Brachioradialis reflexes are 2+ on the right side and 2+ on the left side.      Patellar reflexes are 2+ on the right side and 2+ on the left side. Psychiatric:        Attention and Perception: Attention normal.        Mood and Affect: Mood normal.        Speech: Speech normal.        Behavior: Behavior is cooperative.        Thought Content: Thought content normal.    Results for orders placed or performed in visit on 09/23/23  Basic metabolic panel   Collection Time: 09/23/23  8:39 AM  Result Value Ref Range   Glucose 112 (H) 70 - 99 mg/dL   BUN 18 8 - 27 mg/dL   Creatinine, Ser 8.87 0.76 - 1.27 mg/dL   eGFR 75 >40  fO/fpw/8.26   BUN/Creatinine Ratio 16 10 - 24   Sodium 139 134 - 144 mmol/L   Potassium 3.8 3.5 - 5.2 mmol/L   Chloride 98 96 - 106 mmol/L   CO2 24 20 - 29 mmol/L   Calcium  9.3 8.6 - 10.2 mg/dL      Assessment & Plan:   Problem List Items Addressed This Visit       Cardiovascular and Mediastinum   Aortic atherosclerosis (HCC)   Essential hypertension   Chronic, ongoing.  BP elevated today, has not taken medications this morning. Olmesartan  recently increased for cardiology.  Continue to recommend reduction to cessation of alcohol use, however he continues to drink heavily, his intake has increased since last visit.  Recommend cutting back on alcohol and smoking -- work towards complete cessation.  Recommend he monitor BP at least a few mornings a week at home and document.  DASH diet at home.  Labs today: CBC, TSH, and CMP.       Relevant Orders   TSH     Respiratory   Paraseptal emphysema (HCC) - Primary   Chronic, ongoing.  Continue to collaborate with pulmonary, highly recommend he return to them for visit and repeat lung testing due to his increased SOB and fatigue -- overall cardiac work-up  has been reassuring.  Continue Breztri  BID and Albuterol  as needed at this time.  Continue yearly lung screening.  Recommend complete cessation of smoking.       Relevant Orders   CBC with Differential/Platelet     Other   Alcohol dependence (HCC)   Recommend cutting back -- concern for overall health with ongoing heavy use and suspect major reason for his overall feeling bad at baseline.  Intake has increased since last visit.  Is aware of effect of heavy alcohol use on overall health, especially BP.   Check labs today to include CMP, CBC, MAG.  He refuses rehab, AA, or medications.      Relevant Orders   CBC with Differential/Platelet   Comprehensive metabolic panel   Magnesium    Chronic post-traumatic stress disorder (PTSD)   Refer to schizophrenia plan of care.       Hyponatremia   Chronic and ongoing due to heavier alcohol intake.  Highly recommend he cut back on this.  Also drinks a lot of water with this.  Check level today.      Mixed hyperlipidemia   Chronic, ongoing.  Recommend he start Rosuvastatin  as ordered.  Lipid panel today.       Relevant Orders   Lipid Panel w/o Chol/HDL Ratio   Nicotine  dependence   I have recommended complete cessation of tobacco use. I have discussed various options available for assistance with tobacco cessation including over the counter methods (Nicotine  gum, patch and lozenges). We also discussed prescription options (Chantix, Nicotine  Inhaler / Nasal Spray). The patient is not interested in pursuing any prescription tobacco cessation options at this time.  Continue yearly lung CA screening.       Schizophrenia (HCC)   Chronic, ongoing.  No current medications.  Tolerating this and does not wish to start medication.  Could consider Seroquel in future if needed or return to Clonidine .  Denies SI/HI.        Follow up plan: Return in about 5 weeks (around 01/28/2024) for HTN/HLD, COPD, MOOD.

## 2023-12-24 NOTE — Assessment & Plan Note (Signed)
 Recommend cutting back -- concern for overall health with ongoing heavy use and suspect major reason for his overall feeling bad at baseline.  Intake has increased since last visit.  Is aware of effect of heavy alcohol use on overall health, especially BP.   Check labs today to include CMP, CBC, MAG.  He refuses rehab, AA, or medications.

## 2023-12-24 NOTE — Assessment & Plan Note (Signed)
 Chronic, ongoing.  BP elevated today, has not taken medications this morning. Olmesartan  recently increased for cardiology.  Continue to recommend reduction to cessation of alcohol use, however he continues to drink heavily, his intake has increased since last visit.  Recommend cutting back on alcohol and smoking -- work towards complete cessation.  Recommend he monitor BP at least a few mornings a week at home and document.  DASH diet at home.  Labs today: CBC, TSH, and CMP.

## 2023-12-24 NOTE — Assessment & Plan Note (Signed)
 Chronic, ongoing.  Recommend he start Rosuvastatin  as ordered.  Lipid panel today.

## 2023-12-24 NOTE — Assessment & Plan Note (Signed)
Refer to schizophrenia plan of care. 

## 2023-12-25 ENCOUNTER — Encounter: Payer: Self-pay | Admitting: Nurse Practitioner

## 2023-12-25 LAB — COMPREHENSIVE METABOLIC PANEL
ALT: 18 [IU]/L (ref 0–44)
AST: 21 [IU]/L (ref 0–40)
Albumin: 4 g/dL (ref 3.8–4.9)
Alkaline Phosphatase: 88 [IU]/L (ref 44–121)
BUN/Creatinine Ratio: 14 (ref 10–24)
BUN: 13 mg/dL (ref 8–27)
Bilirubin Total: 0.4 mg/dL (ref 0.0–1.2)
CO2: 24 mmol/L (ref 20–29)
Calcium: 9.1 mg/dL (ref 8.6–10.2)
Chloride: 99 mmol/L (ref 96–106)
Creatinine, Ser: 0.94 mg/dL (ref 0.76–1.27)
Globulin, Total: 2.7 g/dL (ref 1.5–4.5)
Glucose: 85 mg/dL (ref 70–99)
Potassium: 4.1 mmol/L (ref 3.5–5.2)
Sodium: 138 mmol/L (ref 134–144)
Total Protein: 6.7 g/dL (ref 6.0–8.5)
eGFR: 93 mL/min/{1.73_m2} (ref >59)

## 2023-12-25 LAB — CBC WITH DIFFERENTIAL/PLATELET
Basophils Absolute: 0 10*3/uL (ref 0.0–0.2)
Basos: 1 %
EOS (ABSOLUTE): 0.1 10*3/uL (ref 0.0–0.4)
Eos: 1 %
Hematocrit: 46.8 % (ref 37.5–51.0)
Hemoglobin: 15.8 g/dL (ref 13.0–17.7)
Immature Grans (Abs): 0 10*3/uL (ref 0.0–0.1)
Immature Granulocytes: 0 %
Lymphocytes Absolute: 2.1 10*3/uL (ref 0.7–3.1)
Lymphs: 24 %
MCH: 31.2 pg (ref 26.6–33.0)
MCHC: 33.8 g/dL (ref 31.5–35.7)
MCV: 92 fL (ref 79–97)
Monocytes Absolute: 0.7 10*3/uL (ref 0.1–0.9)
Monocytes: 8 %
Neutrophils Absolute: 5.9 10*3/uL (ref 1.4–7.0)
Neutrophils: 66 %
Platelets: 307 10*3/uL (ref 150–450)
RBC: 5.07 x10E6/uL (ref 4.14–5.80)
RDW: 12.2 % (ref 11.6–15.4)
WBC: 8.8 10*3/uL (ref 3.4–10.8)

## 2023-12-25 LAB — LIPID PANEL W/O CHOL/HDL RATIO
Cholesterol, Total: 179 mg/dL (ref 100–199)
HDL: 68 mg/dL (ref >39)
LDL Chol Calc (NIH): 95 mg/dL (ref 0–99)
Triglycerides: 90 mg/dL (ref 0–149)
VLDL Cholesterol Cal: 16 mg/dL (ref 5–40)

## 2023-12-25 LAB — MAGNESIUM: Magnesium: 1.9 mg/dL (ref 1.6–2.3)

## 2023-12-25 LAB — TSH: TSH: 1.61 u[IU]/mL (ref 0.450–4.500)

## 2023-12-25 NOTE — Progress Notes (Signed)
 Contacted via MyChart   Good evening Luke Briggs, your labs have returned and overall these are stable.   - Kidney function, creatinine and eGFR, remains normal, as is liver function, AST and ALT.  - CBC shows no anemia or infection - Thyroid level and magnesium  are normal - Lipid panel shows LDL at 94, would like to see <70 for stroke prevention.  Please ensure you are taking the Rosuvastatin  daily as we discussed.  Any questions?  Please ensure to schedule with pulmonary when they call as I suspect some symptoms may be related to your lung function. Keep being amazing!!  Thank you for allowing me to participate in your care.  I appreciate you. Kindest regards, Jolane Bankhead

## 2023-12-29 ENCOUNTER — Ambulatory Visit: Payer: MEDICAID | Attending: Cardiology | Admitting: Cardiology

## 2023-12-29 ENCOUNTER — Ambulatory Visit: Payer: MEDICAID

## 2023-12-29 ENCOUNTER — Encounter: Payer: Self-pay | Admitting: Cardiology

## 2023-12-29 VITALS — BP 158/90 | HR 89 | Ht 70.0 in | Wt 186.4 lb

## 2023-12-29 DIAGNOSIS — I10 Essential (primary) hypertension: Secondary | ICD-10-CM | POA: Diagnosis not present

## 2023-12-29 DIAGNOSIS — E782 Mixed hyperlipidemia: Secondary | ICD-10-CM

## 2023-12-29 DIAGNOSIS — R002 Palpitations: Secondary | ICD-10-CM

## 2023-12-29 DIAGNOSIS — E871 Hypo-osmolality and hyponatremia: Secondary | ICD-10-CM | POA: Diagnosis not present

## 2023-12-29 DIAGNOSIS — I739 Peripheral vascular disease, unspecified: Secondary | ICD-10-CM

## 2023-12-29 DIAGNOSIS — F17210 Nicotine dependence, cigarettes, uncomplicated: Secondary | ICD-10-CM

## 2023-12-29 DIAGNOSIS — F10288 Alcohol dependence with other alcohol-induced disorder: Secondary | ICD-10-CM

## 2023-12-29 DIAGNOSIS — R0602 Shortness of breath: Secondary | ICD-10-CM

## 2023-12-29 MED ORDER — OLMESARTAN MEDOXOMIL-HCTZ 40-25 MG PO TABS: 1.0000 | ORAL_TABLET | Freq: Every day | ORAL | 1 refills | Status: DC

## 2023-12-29 NOTE — Progress Notes (Signed)
 Cardiology Office Note:  .   Date:  12/29/2023  ID:  Luke Lia., DOB 02/09/1961, MRN [id number] PCP: Lemar Pyles, NP  [facility] Providers Cardiologist:  Constancia Delton, MD    History of Present Illness: .   Luke Briggs. is a 63 y.o. male with a past medical history of aortic atherosclerosis, carotid stenosis followed by [facility], syndrome, GERD, alcohol abuse, tobacco abuse, osteoarthritis, BPH, chronic pain syndrome, schizophrenia, mixed hyperlipidemia, who is here today to follow-up on his hypertension.  Prior echocardiogram completed 10/2019 showed normal systolic and diastolic function.  Lexiscan  Myoview  completed 01/25/2019 showed no evidence of ischemia.  Repeat Lexiscan  Myoview  done 07/2011 for showed low restudy with no evidence of ischemia.  Repeat echocardiogram completed 07/2023 revealed an EF of 50-55%, no RWMA, G1 DD, and mild mitral regurgitation.  He was last seen in clinic 09/23/2023.  At that time he had made some changes to his medication regimen. Previously he had been off on olmesartan  HCTZ due to hypokalemia and was started on losartan .  He had had atopic dermatitis and was started on Dupixent  and was found to have blood pressures of over 200.  Advised to go to the emergency department which she declined.  He had stopped taking the losartan  and started taking half a dose of the olmesartan  HCTZ 20/12.5 mg at bedtime and noted improvement in his blood pressures at home.  After much discussion he was sent in for a follow-up BMP to recheck his sodium level and he was started on an additional 10 mg of olmesartan  in the morning and is continued on losartan  HCTZ 20/12.5 mg in the evening.  There was no other testing that was ordered for the medication changes that were needed at that time.  He returns to clinic today stating that he has been doing well.  Appears to still be intoxicated from the night prior.  Blood pressure is elevated he stated that he may have  forgotten to take his medications last evening.  He continues to have chronic shortness of breathcontinues to smoke a pack and 1/2/day.  Denies any chest pain, peripheral edema, palpitations lightheadedness or dizziness.  States that he has been trying to be more compliant with his current medication regimen.  Denies any recent hospitalizations or visits to the emergency department.  ROS: 10 point review of systems were reviewed and are negative with exception was been listed in the HPI  Studies Reviewed: Aaron Aas   EKG Interpretation Date/Time:  Monday December 29 2023 08:17:06 EST Ventricular Rate:  89 PR Interval:  132 QRS Duration:  82 QT Interval:  370 QTC Calculation: 450 R Axis:   17  Text Interpretation: Normal sinus rhythm Normal ECG When compared with ECG of 15-Jul-2023 08:33, No significant change was found Confirmed by Ronald Cockayne ([id number]) on 12/29/2023 8:23:58 AM    TTE 08/12/23 1. Left ventricular ejection fraction, by estimation, is 50 to 55%. Left  ventricular ejection fraction by 3D volume is 51 %. The left ventricle has  low normal function. The left ventricle has no regional wall motion  abnormalities. Left ventricular  diastolic parameters are consistent with Grade I diastolic dysfunction  (impaired relaxation). The average left ventricular global longitudinal  strain is -11.0 %.   2. Right ventricular systolic function is normal. The right ventricular  size is normal. There is normal pulmonary artery systolic pressure. The  estimated right ventricular systolic pressure is 21.0 mmHg.   3. The mitral valve is  normal in structure. Mild mitral valve  regurgitation. No evidence of mitral stenosis.   4. The aortic valve is tricuspid. Aortic valve regurgitation is not  visualized. No aortic stenosis is present.   5. The inferior vena cava is normal in size with greater than 50%  respiratory variability, suggesting right atrial pressure of 3 mmHg.    Lexiscan  MPI 07/28/23   The  study is normal. The study is low risk.   No ST deviation was noted.   LV perfusion is normal. There is no evidence of ischemia. There is no evidence of infarction.   Left ventricular function is normal. End diastolic cavity size is normal. End systolic cavity size is normal.   CT attenuation images showed minimal aortic and coronary calcifications.   Lexiscan  MPI 01/24/2021 Narrative & Impression  T wave inversion was noted during stress in the V4, V5, V6 and II leads. There was no ST segment deviation noted during stress. The study is normal. This is a low risk study. The left ventricular ejection fraction is normal (55-65%). CT attenuation images showed no significant aortic or coronary calcifications.    This result has not been signed. Information might be incomplete.    TTE 11/18/2019 1. Left ventricular ejection fraction, by visual estimation, is 60 to  65%. The left ventricle has normal function. There is no left ventricular  hypertrophy.   2. The left ventricle has no regional wall motion abnormalities.   3. Global right ventricle has normal systolic function.The right  ventricular size is normal. No increase in right ventricular wall  thickness.   4. Left atrial size was normal.   5. Right atrial size was normal.   6. The mitral valve is normal in structure. No evidence of mitral valve  regurgitation. No evidence of mitral stenosis.   7. The tricuspid valve is normal in structure.   8. The aortic valve was not well visualized. Aortic valve regurgitation  is not visualized. No evidence of aortic valve sclerosis or stenosis.   9. The pulmonic valve was not well visualized. Pulmonic valve  regurgitation is not visualized.  10. Normal pulmonary artery systolic pressure.  11. The inferior vena cava is normal in size with greater than 50%  respiratory variability, suggesting right atrial pressure of 3 mmHg.  Risk Assessment/Calculations:     HYPERTENSION CONTROL Vitals:    12/29/23 0811 12/29/23 0820  BP: (!) 168/88 (!) 158/90    The patient's blood pressure is elevated above target today.  In order to address the patient's elevated BP: A current anti-hypertensive medication was adjusted today.          Physical Exam:   VS:  BP (!) 158/90 (BP Location: Left Arm, Patient Position: Sitting)   Pulse 89   Ht 5\' 10"  (1.778 m)   Wt 186 lb 6.4 oz (84.6 kg)   SpO2 98%   BMI 26.75 kg/m    Wt Readings from Last 3 Encounters:  12/29/23 186 lb 6.4 oz (84.6 kg)  12/24/23 191 lb 3.2 oz (86.7 kg)  09/23/23 187 lb 9.6 oz (85.1 kg)    GEN: Well nourished, well developed in no acute distress NECK: No JVD; No carotid bruits CARDIAC: RRR, no murmurs, rubs, gallops RESPIRATORY:  Clear to auscultation without rales, wheezing or rhonchi  ABDOMEN: Soft, non-tender, non-distended EXTREMITIES:  No edema; No deformity   ASSESSMENT AND PLAN: .   Primary hypertension with blood pressure today 116/88 and recheck of 158/90.  Unfortunately patient is unsure  if he took his medication the past several evenings.  Medications have been changes to use began restarted back on Benicar  HCTZ 40/25 mg once daily.  Previously he was for some carvedilol  but states he never picked up this prescription and does not recall taking it. Per chart review looks like last time it was ordered was 06/2023. He has been encouraged to monitor blood pressure 1-2 hours post medication administration. Previously he had a history of hyponatremia recent labs completed with normal sodium level.   Chronic shortness of breath with chronic tobacco use that is unchanged from previous visits.  Smoking cessation continues to be encouraged.  EKG today revealed normal sinus rhythm with a rate of 89 with no acute change from prior studies.  Chronic hyponatremia last sodium of 138 on 12/24/2023.  Will need to follow-up BMP in several weeks after being restarted on 40/25 mg of olmesartan  HCTZ daily for better blood pressure  control.  Palpitations have improved he occasionally notes them when he has had increased alcohol intake but they are no worse or no better.  Mixed hyperlipidemia with his last LDL of 95 on 12/24/2023.  He is continued on rosuvastatin  10 mg daily.  This continues to be managed by his PCP.  Peripheral arterial disease with bilateral carotid stenosis and continues to be followed by [facility].  He is encouraged to continue with statin therapy as well.  Tobacco and alcohol abuse he has no desire to quit at this time but total cessation continues to be recommended.       Dispo: Patient return to clinic to see MD/APP in 6 months or sooner if needed for further evaluation  Signed, Shron Ozer, NP

## 2023-12-29 NOTE — Patient Instructions (Signed)
 Medication Instructions:  INCREASE the Olmesartan -hydrochlorothiazide  to 40-25 mg once daily (the whole tablet)  STOP the Olmesartan   *If you need a refill on your cardiac medications before your next appointment, please call your pharmacy*   Lab Work: None ordered If you have labs (blood work) drawn today and your tests are completely normal, you will receive your results only by: MyChart Message (if you have MyChart) OR A paper copy in the mail If you have any lab test that is abnormal or we need to change your treatment, we will call you to review the results.   Testing/Procedures: None ordered   Follow-Up: At [facility], you and your health needs are our priority.  As part of our continuing mission to provide you with exceptional heart care, we have created designated Provider Care Teams.  These Care Teams include your primary Cardiologist (physician) and Advanced Practice Providers (APPs -  Physician Assistants and Nurse Practitioners) who all work together to provide you with the care you need, when you need it.  We recommend signing up for the patient portal called "MyChart".  Sign up information is provided on this After Visit Summary.  MyChart is used to connect with patients for Virtual Visits (Telemedicine).  Patients are able to view lab/test results, encounter notes, upcoming appointments, etc.  Non-urgent messages can be sent to your provider as well.   To learn more about what you can do with MyChart, go to [email or web address].    Your next appointment:   6 month(s)  Provider:   You may see Constancia Delton, MD or one of the following Advanced Practice Providers on your designated Care Team:   Ronald Cockayne, NP

## 2024-01-12 ENCOUNTER — Encounter: Payer: Self-pay | Admitting: Dermatology

## 2024-01-12 ENCOUNTER — Ambulatory Visit (INDEPENDENT_AMBULATORY_CARE_PROVIDER_SITE_OTHER): Payer: MEDICAID | Admitting: Dermatology

## 2024-01-12 DIAGNOSIS — L209 Atopic dermatitis, unspecified: Secondary | ICD-10-CM

## 2024-01-12 NOTE — Patient Instructions (Signed)

## 2024-01-12 NOTE — Progress Notes (Signed)
   Follow-Up Visit   Subjective  Luke Briggs. is a 63 y.o. male who presents for the following: atopic dermatitis follow-up, pt states his last injection was on 12/01/23, pt did receive call from [facility], but did not feel comfortable paying invoice through the phone so they mailed him invoice. Pt states he is unsure if he wants to proceed with mailing invoice back with payment pt states he does not want to deal with the delivery and sending money to foreign country. Pt also using topicals at home, TMC % cream and work well.    The following portions of the chart were reviewed this encounter and updated as appropriate: medications, allergies, medical history  Review of Systems:  No other skin or systemic complaints except as noted in HPI or Assessment and Plan.  Objective  Well appearing patient in no apparent distress; mood and affect are within normal limits.   A focused examination was performed of the following areas: Face, hands, trunk, arms   Relevant exam findings are noted in the Assessment and Plan.    Assessment & Plan   ATOPIC DERMATITIS - severe, but significantly improved on Dupixent injections and topical steroids, at patient goal   Exam: Clear  0% BSA on Dupixent, previous BSA 20%   Chronic and persistent condition with duration or expected duration over one year. Atopic dermatitis (eczema) is a chronic, relapsing, pruritic condition that can significantly affect quality of life. It is often associated with allergic rhinitis and/or asthma and can require treatment with topical medications, phototherapy, or in severe cases biologic injectable medication (Dupixent; Adbry) or Oral JAK inhibitors.   Treatment Plan: -patient had $4 copay with [facility] but did not feel comfortable sending money over the phone. Has concerns about the money going to foreign countries. Atopic dermatitis has improved and patient prefers to control it with steroid creams for now.  Patient will follow up if it severely flares again -Will cancel Dupixent injections per patient request. -Patient will continue using TMC 0.1% cream BID until smooth. No refills needed  Topical steroids (such as triamcinolone, fluocinolone, fluocinonide, mometasone, clobetasol, halobetasol, betamethasone, hydrocortisone) can cause thinning and lightening of the skin if they are used for too long in the same area. Your physician has selected the right strength medicine for your problem and area affected on the body. Please use your medication only as directed by your physician to prevent side effects.     ATOPIC DERMATITIS, UNSPECIFIED TYPE    Return if symptoms worsen or fail to improve.  Wynonia Lawman, CMA, am acting as scribe for Elie Goody, MD .   Documentation: I have reviewed the above documentation for accuracy and completeness, and I agree with the above.  Elie Goody, MD

## 2024-01-25 NOTE — Patient Instructions (Incomplete)
 Living with COPD Being diagnosed with chronic obstructive pulmonary disease (COPD) changes your life physically and emotionally. Having COPD can affect your ability to work and do things you enjoy. COPD is not the same for everyone, and it may change over time. Your health care providers can help you come up with the COPD management plan that works best for you. How to manage lifestyle changes Treatment plan Work closely with your health care providers. Follow your COPD management plan. This plan includes: Instructions about activities, exercises, diet, medicines, what to do when COPD flares up, and when to call your health care provider. A pulmonary rehabilitation program. In pulmonary rehab, you will learn about COPD, do exercises for fitness and breathing, and get support from health care providers and other people who have COPD. Managing emotions and stress Living with a chronic disease means you may also struggle with stressful emotions, such as sadness, fear, and worry. Here are some ways to manage these emotions: Talk to someone about your fear, anxiety, depression, or stress. Learn strategies to avoid or reduce stress and ask for help if you are struggling with depression or anxiety. Consider joining a COPD support group, online or in person.  Adjusting to changes COPD may limit the things you can do, but you can make certain changes to help you cope with the diagnosis. Ask for help when you need it. Getting support from friends, family, and your health care team is an important part of managing the condition. Try to get regular exercise as prescribed by a health care provider or pulmonary rehab team. Exercising can help COPD, even if you are a bit short of breath. Take steps to prevent infection and protect your lungs: Wash your hands often and avoid being in crowds. Stay away from friends and family members who are sick. Check your local air quality each day, and stay out of areas  where air pollution is likely. How to recognize changes in your condition Recognizing changes in your COPD COPD is a progressive disease. It is important to let the health care team know if your COPD is getting worse. Your treatment plan may need to change. Watch for: Increased shortness of breath, wheezing, cough, or fatigue. Loss of ability to exercise or perform daily activities, like climbing stairs. More frequent symptom flares. Signs of depression or anxiety. Recognizing stress It is normal to have additional stress when you have COPD. However, prolonged stress and anxiety can make COPD worse and lead to depression. Recognize the warning signs, which include: Feeling sad or worried more often or most of the time. Having less energy and losing interest in pleasurable activities. Changes in your appetite or sleeping patterns. Being easily angered or irritated. Having unexplained aches and pains, digestive problems, or headaches. Follow these instructions at home: Eating and drinking  Eat foods that are high in fiber, such as fresh fruits and vegetables, whole grains, and beans. Limit foods that are high in fat and processed sugars, such as fried or sweet foods. Follow a balanced diet and maintain a healthy weight. Being overweight or underweight can make COPD worse. You may work with a Data processing manager as part of your pulmonary rehab program. Drink enough fluid to keep your urine pale yellow. If you drink alcohol: Limit how much you have to: 0-1 drink a day for women who are not pregnant. 0-2 drinks a day for men. Know how much alcohol is in your drink. In the [place] one drink equals one 12 oz bottle  of beer (355 mL), one 5 oz glass of wine (148 mL), or one 1 oz glass of hard liquor (44 mL). Lifestyle If you smoke, the most important thing that you can do is to stop smoking. Continuing to smoke will cause the disease to progress faster. Do not use any products that contain nicotine or  tobacco. These products include cigarettes, chewing tobacco, and vaping devices, such as e-cigarettes. If you need help quitting, ask your health care provider. Avoid exposure to things that irritate your lungs, such as smoke, chemicals, and fumes. Activity Balance exercise and rest. Take short walks every 1-2 hours. This is important to improve blood flow and breathing. Ask for help if you feel weak or unsteady. Do exercises that include controlled breathing with body movement, such as tai chi. General instructions Take over-the-counter and prescription medicines only as told by your health care provider. Take vitamin and protein supplements as told by your health care provider or dietitian. Practice good oral hygiene and see your dental care provider regularly. An oral infection can also spread to your lungs. Make sure you receive all the vaccines that your health care provider recommends. Keep all follow-up visits. This is important. Contact a health care provider if you: Are struggling to manage your COPD. Have emotional stress that interferes with your ability to cope with COPD. Get help right away if you: Have thoughts of suicide, death, or hurting yourself or others. If you ever feel like you may hurt yourself or others, or have thoughts about taking your own life, get help right away. Go to your nearest emergency department or: Call your local emergency services (911 in the [place] Call a suicide crisis helpline, such as the National Suicide Prevention Lifeline at 778-809-3222 or 988 in the [place] This is open 24 hours a day in the [place] If you're a Veteran: Call 988 and press 1. This is open 24 hours a day. Text the [employer] at 603-761-8082. Summary Being diagnosed with chronic obstructive pulmonary disease (COPD) changes your life physically and emotionally. Work with your health care providers and follow your COPD management plan. A pulmonary rehabilitation program is an  important part of COPD management. Prolonged stress, anxiety, and depression can make COPD worse. Let your health care provider know if emotional stress interferes with your ability to cope with and manage COPD. This information is not intended to replace advice given to you by your health care provider. Make sure you discuss any questions you have with your health care provider. Document Revised: 06/19/2023 Document Reviewed: 11/22/2020 Elsevier Patient Education  2024 [employer].

## 2024-01-30 ENCOUNTER — Ambulatory Visit: Payer: MEDICAID | Admitting: Nurse Practitioner

## 2024-01-30 ENCOUNTER — Encounter: Payer: Self-pay | Admitting: Nurse Practitioner

## 2024-01-30 VITALS — BP 138/90 | HR 87 | Temp 98.4°F | Ht 70.0 in | Wt 187.2 lb

## 2024-01-30 DIAGNOSIS — F209 Schizophrenia, unspecified: Secondary | ICD-10-CM

## 2024-01-30 DIAGNOSIS — I739 Peripheral vascular disease, unspecified: Secondary | ICD-10-CM | POA: Diagnosis not present

## 2024-01-30 DIAGNOSIS — I10 Essential (primary) hypertension: Secondary | ICD-10-CM

## 2024-01-30 DIAGNOSIS — I700 Atherosclerosis of aorta: Secondary | ICD-10-CM

## 2024-01-30 DIAGNOSIS — E782 Mixed hyperlipidemia: Secondary | ICD-10-CM

## 2024-01-30 DIAGNOSIS — J438 Other emphysema: Secondary | ICD-10-CM | POA: Diagnosis not present

## 2024-01-30 DIAGNOSIS — F10288 Alcohol dependence with other alcohol-induced disorder: Secondary | ICD-10-CM

## 2024-01-30 DIAGNOSIS — F4312 Post-traumatic stress disorder, chronic: Secondary | ICD-10-CM

## 2024-01-30 MED ORDER — PREDNISONE 20 MG PO TABS: 40.0000 mg | ORAL_TABLET | Freq: Every day | ORAL | 0 refills | Status: AC

## 2024-01-30 MED ORDER — IPRATROPIUM-ALBUTEROL 0.5-2.5 (3) MG/3ML IN SOLN: 3.0000 mL | RESPIRATORY_TRACT | 4 refills | Status: AC | PRN

## 2024-01-30 MED ORDER — OMEPRAZOLE 40 MG PO CPDR: 40.0000 mg | DELAYED_RELEASE_CAPSULE | Freq: Two times a day (BID) | ORAL | 4 refills | Status: AC

## 2024-01-30 MED ORDER — IPRATROPIUM-ALBUTEROL 0.5-2.5 (3) MG/3ML IN SOLN
3.0000 mL | Freq: Once | RESPIRATORY_TRACT | Status: AC
Start: 2024-01-30 — End: 2024-01-30
  Administered 2024-01-30: 3 mL via RESPIRATORY_TRACT

## 2024-01-30 MED ORDER — DOXYCYCLINE HYCLATE 100 MG PO TABS: 100.0000 mg | ORAL_TABLET | Freq: Two times a day (BID) | ORAL | 0 refills | Status: AC

## 2024-01-30 NOTE — Assessment & Plan Note (Signed)
Chronic, ongoing.  Followed by vascular, recent notes reviewed.

## 2024-01-30 NOTE — Assessment & Plan Note (Signed)
Refer to schizophrenia plan of care. 

## 2024-01-30 NOTE — Assessment & Plan Note (Addendum)
Chronic, noted on CT lung screening.  Continue statin therapy + recommend complete cessation of smoking.  Lipid panel up to date.

## 2024-01-30 NOTE — Progress Notes (Signed)
 BP (!) 138/90 (BP Location: Left Arm, Patient Position: Sitting, Cuff Size: Normal)   Pulse 87   Temp 98.4 F (36.9 C) (Oral)   Ht 5\' 10"  (1.778 m)   Wt 187 lb 3.2 oz (84.9 kg)   SpO2 98%   BMI 26.86 kg/m    Subjective:    Patient ID: Luke Boatman., male    DOB: May 27, 1961, 63 y.o.   MRN: [id number]  HPI: Luke Ahrendt. is a 63 y.o. male  Chief Complaint  Patient presents with   COPD   Depression   Hyperlipidemia   Hypertension   HYPERTENSION without Chronic Kidney Disease (has not taken medication this morning, reports he was told to take at night) Taking Olmesartan/HCTZ 40/25 MG, it was increased to this recently with cardiology.  Taking Rosuvastatin.  Saw cardiology 12/29/23.  Last echo noted EF 50-55% and G1 DD on 08/12/23.    Continues to drink alcohol nightly -- 8-12 [employer] a day, previous was 6-8. Reports drinking helps his tinnitus.  Feels worse if he does not drink.  Hypertension status: uncontrolled  Satisfied with current treatment? yes Duration of hypertension: chronic BP monitoring frequency:  not checking BP range:  BP medication side effects:  no Medication compliance: good compliance Aspirin: no Recurrent headaches: no Visual changes: no Palpitations: no Dyspnea: yes Chest pain: no Lower extremity edema: no Dizzy/lightheaded: occasional The 10-year ASCVD risk score (Arnett DK, et al., 2019) is: 13.2%   Values used to calculate the score:     Age: 47 years     Sex: Male     Is Non-Hispanic African American: No     Diabetic: No     Tobacco smoker: Yes     Systolic Blood Pressure: 138 mmHg     Is BP treated: Yes     HDL Cholesterol: 68 mg/dL     Total Cholesterol: 179 mg/dL   COPD Continues on Breztri BID and using Albuterol BID.  Reports ongoing fatigue which we have discussed at past visits, even vacuuming will make him out of breath.  Has history of low NA+ on labs, recent was stable. Sees pulmonary on the 24th.  Continues  to smoke 1 to 1 1/2 PPD.  Has smoked for >45 years. COPD status: stable Satisfied with current treatment?: yes Oxygen use: no Dyspnea frequency: yes Cough frequency: smoker's cough Rescue inhaler frequency:  twice a day Limitation of activity: no Productive cough: none Last Spirometry: with pulmonary Pneumovax: Up to Date Influenza: Up to Date   DEPRESSION He would like to cut back on drinking, does not want to lose his girl though.  She comes home and drinks after work.  He cannot stop at one. Mood status: stable Symptom severity: moderate  Psychotherapy/counseling: no none Depressed mood: yes Anxious mood: yes Anhedonia: no Significant weight loss or gain: no Insomnia: yes hard to fall asleep Fatigue: yes Feelings of worthlessness or guilt: yes Impaired concentration/indecisiveness: no Suicidal ideations: fleeting on occasion, but will not act on it as he wants to go to heaven Hopelessness: yes Crying spells: yes    01/30/2024    8:37 AM 12/24/2023    9:03 AM 08/14/2023    8:27 AM 07/25/2023    9:24 AM 07/11/2023    8:52 AM  Depression screen PHQ 2/9  Decreased Interest 2 3 3 3  0  Down, Depressed, Hopeless 3 2 3 3  0  PHQ - 2 Score 5 5 6 6  0  Altered sleeping  2 2 3 3  0  Tired, decreased energy 3 3 3 3 3   Change in appetite 2 1 3 1  0  Feeling bad or failure about yourself  3 2 1 2  0  Trouble concentrating 2 1 3 3 2   Moving slowly or fidgety/restless 0 0 2 3 0  Suicidal thoughts 2 0 0 0 0  PHQ-9 Score 19 14 21 21 5   Difficult doing work/chores Very difficult Very difficult Extremely dIfficult Very difficult Not difficult at all       01/30/2024    8:37 AM 12/24/2023    9:03 AM 08/14/2023    8:27 AM 07/25/2023    9:24 AM  GAD 7 : Generalized Anxiety Score  Nervous, Anxious, on Edge 2 1 2 2   Control/stop worrying 1 2 2 2   Worry too much - different things 2 2 2 2   Trouble relaxing 2 3 2 3   Restless 1 0 1 2  Easily annoyed or irritable 2 1 3 3   Afraid - awful might  happen 3 2 3 3   Total GAD 7 Score 13 11 15 17   Anxiety Difficulty Very difficult Somewhat difficult Very difficult Very difficult   Relevant past medical, surgical, family and social history reviewed and updated as indicated. Interim medical history since our last visit reviewed. Allergies and medications reviewed and updated.  Review of Systems  Constitutional:  Positive for fatigue. Negative for activity change, diaphoresis and fever.  Respiratory:  Positive for cough (chronic) and shortness of breath (with activity sometimes). Negative for chest tightness and wheezing.   Cardiovascular:  Negative for chest pain, palpitations and leg swelling.  Gastrointestinal: Negative.   Musculoskeletal: Negative.   Skin: Negative.   Neurological: Negative.   Psychiatric/Behavioral: Negative.     Per HPI unless specifically indicated above     Objective:    BP (!) 138/90 (BP Location: Left Arm, Patient Position: Sitting, Cuff Size: Normal)   Pulse 87   Temp 98.4 F (36.9 C) (Oral)   Ht 5\' 10"  (1.778 m)   Wt 187 lb 3.2 oz (84.9 kg)   SpO2 98%   BMI 26.86 kg/m   Wt Readings from Last 3 Encounters:  01/30/24 187 lb 3.2 oz (84.9 kg)  12/29/23 186 lb 6.4 oz (84.6 kg)  12/24/23 191 lb 3.2 oz (86.7 kg)    Physical Exam Vitals and nursing note reviewed.  Constitutional:      General: He is awake. He is not in acute distress.    Appearance: He is well-developed and well-groomed. He is not ill-appearing or toxic-appearing.  HENT:     Head: Normocephalic.     Right Ear: Hearing and external ear normal.     Left Ear: Hearing and external ear normal.  Eyes:     General: Lids are normal. No visual field deficit.    Extraocular Movements: Extraocular movements intact.     Conjunctiva/sclera: Conjunctivae normal.  Neck:     Thyroid: No thyromegaly.     Vascular: No carotid bruit.  Cardiovascular:     Rate and Rhythm: Normal rate and regular rhythm.     Heart sounds: Normal heart sounds. No  murmur heard.    No gallop.  Pulmonary:     Effort: Pulmonary effort is normal. No accessory muscle usage or respiratory distress.     Breath sounds: Decreased air movement present. Wheezing present. No decreased breath sounds or rhonchi.     Comments: Expiratory wheezes noted throughout. Abdominal:  General: Bowel sounds are normal. There is no distension.     Palpations: Abdomen is soft.     Tenderness: There is no abdominal tenderness.  Musculoskeletal:     Cervical back: Full passive range of motion without pain.     Right lower leg: No edema.     Left lower leg: No edema.  Lymphadenopathy:     Cervical: No cervical adenopathy.  Skin:    General: Skin is warm.     Capillary Refill: Capillary refill takes less than 2 seconds.     Findings: No rash.  Neurological:     Mental Status: He is alert and oriented to person, place, and time.     Cranial Nerves: No facial asymmetry.     Sensory: No sensory deficit.     Motor: Tremor (both hands, baseline) present. No weakness.     Coordination: Coordination is intact.     Gait: Gait is intact.     Deep Tendon Reflexes: Reflexes are normal and symmetric.     Reflex Scores:      Brachioradialis reflexes are 2+ on the right side and 2+ on the left side.      Patellar reflexes are 2+ on the right side and 2+ on the left side. Psychiatric:        Attention and Perception: Attention normal.        Mood and Affect: Mood normal.        Speech: Speech normal.        Behavior: Behavior is cooperative.        Thought Content: Thought content normal.    Results for orders placed or performed in visit on 12/24/23  CBC with Differential/Platelet   Collection Time: 12/24/23  9:51 AM  Result Value Ref Range   WBC 8.8 3.4 - 10.8 x10E3/uL   RBC 5.07 4.14 - 5.80 x10E6/uL   Hemoglobin 15.8 13.0 - 17.7 g/dL   Hematocrit 65.7 84.6 - 51.0 %   MCV 92 79 - 97 fL   MCH 31.2 26.6 - 33.0 pg   MCHC 33.8 31.5 - 35.7 g/dL   RDW 96.2 95.2 - 84.1 %    Platelets 307 150 - 450 x10E3/uL   Neutrophils 66 Not Estab. %   Lymphs 24 Not Estab. %   Monocytes 8 Not Estab. %   Eos 1 Not Estab. %   Basos 1 Not Estab. %   Neutrophils Absolute 5.9 1.4 - 7.0 x10E3/uL   Lymphocytes Absolute 2.1 0.7 - 3.1 x10E3/uL   Monocytes Absolute 0.7 0.1 - 0.9 x10E3/uL   EOS (ABSOLUTE) 0.1 0.0 - 0.4 x10E3/uL   Basophils Absolute 0.0 0.0 - 0.2 x10E3/uL   Immature Granulocytes 0 Not Estab. %   Immature Grans (Abs) 0.0 0.0 - 0.1 x10E3/uL  Comprehensive metabolic panel   Collection Time: 12/24/23  9:51 AM  Result Value Ref Range   Glucose 85 70 - 99 mg/dL   BUN 13 8 - 27 mg/dL   Creatinine, Ser 3.24 0.76 - 1.27 mg/dL   eGFR 93 >40 NU/UVO/5.36   BUN/Creatinine Ratio 14 10 - 24   Sodium 138 134 - 144 mmol/L   Potassium 4.1 3.5 - 5.2 mmol/L   Chloride 99 96 - 106 mmol/L   CO2 24 20 - 29 mmol/L   Calcium 9.1 8.6 - 10.2 mg/dL   Total Protein 6.7 6.0 - 8.5 g/dL   Albumin 4.0 3.8 - 4.9 g/dL   Globulin, Total 2.7 1.5 - 4.5 g/dL  Bilirubin Total 0.4 0.0 - 1.2 mg/dL   Alkaline Phosphatase 88 44 - 121 IU/L   AST 21 0 - 40 IU/L   ALT 18 0 - 44 IU/L  TSH   Collection Time: 12/24/23  9:51 AM  Result Value Ref Range   TSH 1.610 0.450 - 4.500 uIU/mL  Lipid Panel w/o Chol/HDL Ratio   Collection Time: 12/24/23  9:51 AM  Result Value Ref Range   Cholesterol, Total 179 100 - 199 mg/dL   Triglycerides 90 0 - 149 mg/dL   HDL 68 >16 mg/dL   VLDL Cholesterol Cal 16 5 - 40 mg/dL   LDL Chol Calc (NIH) 95 0 - 99 mg/dL  Magnesium   Collection Time: 12/24/23  9:51 AM  Result Value Ref Range   Magnesium 1.9 1.6 - 2.3 mg/dL      Assessment & Plan:   Problem List Items Addressed This Visit       Cardiovascular and Mediastinum   Aortic atherosclerosis (HCC)   Chronic, noted on CT lung screening.  Continue statin therapy + recommend complete cessation of smoking.  Lipid panel up to date.      Essential hypertension   Chronic, ongoing.  BP slightly above goal, has not  taken medication. Olmesartan recently increased by cardiology.  Continue to recommend reduction to cessation of alcohol use, however he continues to drink heavily, his intake has increased.  Recommend cutting back on alcohol and smoking -- work towards complete cessation.  Recommend he monitor BP at least a few mornings a week at home and document.  DASH diet at home.  Labs today: up to date.  Could consider Hydralazine in future.       PAD (peripheral artery disease) (HCC)   Chronic, ongoing.  Followed by vascular, recent notes reviewed.        Respiratory   Paraseptal emphysema (HCC) - Primary   Chronic, ongoing.  Suspect slight exacerbation present.  Continue to collaborate with pulmonary, he is scheduled upcoming which will be beneficial with his increased SOB.  Continue Breztri BID and Albuterol as needed at this time.  Continue yearly lung screening.  Recommend complete cessation of smoking. Will start Prednisone 40 MG daily for 5 days and Doxycycline BID for 7 days.  Sent in Duoneb solution and will get him a nebulizer machine, this may be beneficial to his SOB.      Relevant Medications   ipratropium-albuterol (DUONEB) 0.5-2.5 (3) MG/3ML nebulizer solution 3 mL   predniSONE (DELTASONE) 20 MG tablet   ipratropium-albuterol (DUONEB) 0.5-2.5 (3) MG/3ML SOLN     Other   Alcohol dependence (HCC)   Ongoing.  Recommend cutting back -- concern for overall health with ongoing heavy use and suspect major reason for his overall feeling bad at baseline.  Intake has increased recently, but he is considering rehab.  Main trigger is his significant other also drinks.  Is aware of effect of heavy alcohol use on overall health, especially BP.   LABS: up to date.  Refuses AA or medication at this time.      Chronic post-traumatic stress disorder (PTSD)   Refer to schizophrenia plan of care.      Mixed hyperlipidemia   Chronic, ongoing.  Recommend he continue Rosuvastatin daily and will adjust as  needed.  Lipid panel up to date.       Schizophrenia (HCC)   Chronic, ongoing.  No current medications.  Tolerating this and does not wish to start medication.  Could consider  Seroquel in future if needed or return to Clonidine.  Denies SI/HI - fleeting thoughts, but reports he would never harm himself and has safety plan.         Follow up plan: Return in about 3 months (around 05/01/2024) for HTN/HLD, COPD, Depression.

## 2024-01-30 NOTE — Assessment & Plan Note (Signed)
 Ongoing.  Recommend cutting back -- concern for overall health with ongoing heavy use and suspect major reason for his overall feeling bad at baseline.  Intake has increased recently, but he is considering rehab.  Main trigger is his significant other also drinks.  Is aware of effect of heavy alcohol use on overall health, especially BP.   LABS: up to date.  Refuses AA or medication at this time.

## 2024-01-30 NOTE — Assessment & Plan Note (Addendum)
 Chronic, ongoing.  No current medications.  Tolerating this and does not wish to start medication.  Could consider Seroquel in future if needed or return to Clonidine.  Denies SI/HI - fleeting thoughts, but reports he would never harm himself and has safety plan.

## 2024-01-30 NOTE — Assessment & Plan Note (Signed)
 Chronic, ongoing.  Recommend he continue Rosuvastatin daily and will adjust as needed.  Lipid panel up to date.

## 2024-01-30 NOTE — Assessment & Plan Note (Signed)
 Chronic, ongoing.  BP slightly above goal, has not taken medication. Olmesartan recently increased by cardiology.  Continue to recommend reduction to cessation of alcohol use, however he continues to drink heavily, his intake has increased.  Recommend cutting back on alcohol and smoking -- work towards complete cessation.  Recommend he monitor BP at least a few mornings a week at home and document.  DASH diet at home.  Labs today: up to date.  Could consider Hydralazine in future.

## 2024-01-30 NOTE — Assessment & Plan Note (Signed)
 Chronic, ongoing.  Suspect slight exacerbation present.  Continue to collaborate with pulmonary, he is scheduled upcoming which will be beneficial with his increased SOB.  Continue Breztri BID and Albuterol as needed at this time.  Continue yearly lung screening.  Recommend complete cessation of smoking. Will start Prednisone 40 MG daily for 5 days and Doxycycline BID for 7 days.  Sent in Duoneb solution and will get him a nebulizer machine, this may be beneficial to his SOB.

## 2024-02-05 ENCOUNTER — Telehealth: Payer: Self-pay | Admitting: Nurse Practitioner

## 2024-02-05 NOTE — Telephone Encounter (Signed)
 Nebulizer has been ordered through [employer]. Awaiting providers signature on the order.

## 2024-02-05 NOTE — Telephone Encounter (Signed)
 Copied from CRM [id number]. Topic: General - Other >> Feb 04, 2024  4:49 PM Emylou G wrote: Reason for CRM: Patient call checking on status of cpap order his visit was 3/14 pls call him [phone]

## 2024-02-09 ENCOUNTER — Telehealth: Payer: Self-pay | Admitting: Nurse Practitioner

## 2024-02-09 ENCOUNTER — Encounter: Payer: Self-pay | Admitting: Pulmonary Disease

## 2024-02-09 ENCOUNTER — Ambulatory Visit: Payer: MEDICAID | Admitting: Pulmonary Disease

## 2024-02-09 VITALS — BP 124/84 | HR 90 | Temp 97.1°F | Ht 70.0 in | Wt 187.6 lb

## 2024-02-09 DIAGNOSIS — J439 Emphysema, unspecified: Secondary | ICD-10-CM

## 2024-02-09 DIAGNOSIS — J4489 Other specified chronic obstructive pulmonary disease: Secondary | ICD-10-CM

## 2024-02-09 MED ORDER — AEROCHAMBER MV MISC: 0 refills | Status: AC

## 2024-02-09 MED ORDER — AZITHROMYCIN 250 MG PO TABS: 250.0000 mg | ORAL_TABLET | ORAL | 3 refills | Status: AC

## 2024-02-09 NOTE — Progress Notes (Signed)
 Synopsis: Referred in  by Marjie Skiff, NP   Subjective:   PATIENT ID: Luke Briggs. GENDER: male DOB: 1960-12-28, MRN: [id number]  Chief Complaint  Patient presents with   Follow-up    Increased DOE. Cough with clear sputum. Occasional wheezing.  Wakes up gasping for air. Restless Sleep.     HPI Case of a 64 year old male patient with a past medical history of heavy tobacco use, alcohol use disorder and COPD presenting today to the pulmonary clinic for ongoing worsening shortness of breath for the past 6 months.   He reports that he has been compliant with Breztri 2 puffs twice a day however his breathing has been getting gradually worse. To the point now where he notices it with basic ADL. Associated with cough and clear jelly like sputum production.   He does also report loud snoring and waking up gasping for air couple of times a night.   He had eczema in the past and was on dupixent.   CTA chest obtained by his PCP 07/2023 did not show any PE, but showed mild emphysema and signs of chronic bronchitis.   Echocardiogram 07/2023 with normal LVEF wo RWMA. Grade I Diastolic dysfunction.   Unfortunately he continues to smoke 2 packs per day and has been smoking for 49 years. He drinks 12 beers a night.   Strong family history of emphysema most notable in his father who was a heavy smoker.    ROS All systems were reviewed and are negative except for the above.  Objective:   Vitals:   02/09/24 0953  BP: 124/84  Pulse: 90  Temp: (!) 97.1 F (36.2 C)  SpO2: 98%  Weight: 187 lb 9.6 oz (85.1 kg)  Height: 5\' 10"  (1.778 m)   98% on RA BMI Readings from Last 3 Encounters:  02/09/24 26.92 kg/m  01/30/24 26.86 kg/m  12/29/23 26.75 kg/m   Wt Readings from Last 3 Encounters:  02/09/24 187 lb 9.6 oz (85.1 kg)  01/30/24 187 lb 3.2 oz (84.9 kg)  12/29/23 186 lb 6.4 oz (84.6 kg)    Physical Exam GEN: NAD HEENT: Supple Neck, Reactive Pupils, EOMI  CVS: Normal  S1, Normal S2, RRR, No murmurs or ES appreciated  Lungs: Poor air movement but mostly clear. .  Abdomen: Soft, non tender, non distended, + BS  Extremities: Warm and well perfused, No edema  Skin: No suspicious lesions appreciated  Psych: Normal Affect  Ancillary Information   CBC    Component Value Date/Time   WBC 8.8 12/24/2023 0951   WBC 10.4 08/14/2023 0913   RBC 5.07 12/24/2023 0951   RBC 5.19 08/14/2023 0913   HGB 15.8 12/24/2023 0951   HCT 46.8 12/24/2023 0951   PLT 307 12/24/2023 0951   MCV 92 12/24/2023 0951   MCH 31.2 12/24/2023 0951   MCH 31.0 08/14/2023 0913   MCHC 33.8 12/24/2023 0951   MCHC 35.0 08/14/2023 0913   RDW 12.2 12/24/2023 0951   LYMPHSABS 2.1 12/24/2023 0951   MONOABS 1.0 08/14/2023 0913   EOSABS 0.1 12/24/2023 0951   BASOSABS 0.0 12/24/2023 0951        No data to display           Assessment & Plan:  Case of a 63 year old male patient with a past medical history of heavy tobacco use, alcohol use disorder and COPD presenting today to the pulmonary clinic for ongoing worsening shortness of breath for the past 6  months.   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  []  Referral to pulmonary rehab placed.  []  Will consider starting biologics vs ohtubayre depending on his pfts.   #Suspicon for OSA  Witness apneic episodes. Loud snoring and fatigue during the day.   #Tobaco use disorder  Has 94 PPY. Already enrolled in LDCT next CT chest 07/2024. Not interested in smoking cessation but will try to cut down to 1 ppd.   RTC 3 months .  I spent 60 minutes caring for this patient today, including preparing to see the patient, obtaining a medical history , reviewing a separately obtained history, performing a medically appropriate examination and/or evaluation, counseling and educating the patient/family/caregiver,  ordering medications, tests, or procedures, documenting clinical information in the electronic health record, and independently interpreting results (not separately reported/billed) and communicating results to the patient/family/caregiver  Janann Colonel, MD [facility] 02/09/2024 10:45 AM

## 2024-02-09 NOTE — Telephone Encounter (Signed)
 Copied from CRM [id number]. Topic: General - Other >> Feb 04, 2024  4:49 PM Emylou G wrote: Reason for CRM: Patient call checking on status of cpap order his visit was 3/14 pls call him [phone] >> Feb 06, 2024  4:25 PM Emylou G wrote: Lurena Joiner w/[employer] called adv unable to do the nebulizer order.. they are not in network.. Pls advise.

## 2024-02-12 NOTE — Telephone Encounter (Signed)
 Patient states he is terrible and he will likely be going to the ED to be checked. States he is having a lot of lower abdominal pain and is constipated.   For the nebulizer (not CPAP) he girlfriend did buy him one OTC however the order we had started needed to be sent to  new vendor who works with his insurance. He can decide when they call him for the order whether he wants to proceed or not.

## 2024-02-12 NOTE — Telephone Encounter (Signed)
 Noted.

## 2024-03-01 ENCOUNTER — Ambulatory Visit (INDEPENDENT_AMBULATORY_CARE_PROVIDER_SITE_OTHER): Payer: MEDICAID

## 2024-03-01 ENCOUNTER — Ambulatory Visit (INDEPENDENT_AMBULATORY_CARE_PROVIDER_SITE_OTHER): Payer: MEDICAID | Admitting: Nurse Practitioner

## 2024-03-01 ENCOUNTER — Encounter (INDEPENDENT_AMBULATORY_CARE_PROVIDER_SITE_OTHER): Payer: Self-pay | Admitting: Nurse Practitioner

## 2024-03-01 VITALS — BP 163/97 | HR 82 | Resp 18 | Ht 70.0 in | Wt 189.2 lb

## 2024-03-01 DIAGNOSIS — E782 Mixed hyperlipidemia: Secondary | ICD-10-CM

## 2024-03-01 DIAGNOSIS — I10 Essential (primary) hypertension: Secondary | ICD-10-CM | POA: Diagnosis not present

## 2024-03-01 DIAGNOSIS — I739 Peripheral vascular disease, unspecified: Secondary | ICD-10-CM

## 2024-03-01 DIAGNOSIS — I6523 Occlusion and stenosis of bilateral carotid arteries: Secondary | ICD-10-CM | POA: Diagnosis not present

## 2024-03-01 NOTE — Progress Notes (Signed)
 MRN : [id number]  Luke Briggs. is a 63 y.o. ([birth year]) male who presents with chief complaint of check carotid arteries.  History of Present Illness:   The patient is seen for follow up evaluation of carotid stenosis status post CT angiogram. CT scan was done 01/25/2021.  Patient reports that the test went well with no problems or complications.    The patient denies interval amaurosis fugax. There is no recent or interval TIA symptoms or focal motor deficits. There is no prior documented CVA.   The patient is taking enteric-coated aspirin 81 mg daily.   There is no history of migraine headaches. There is no history of seizures.   The patient has a history of coronary artery disease, no recent episodes of angina or shortness of breath. The patient denies PAD or claudication symptoms. There is a history of hyperlipidemia which is being treated with a statin.     Previous CT angiogram is reviewed by me personally and shows widely patent intracranial arteries. Previous MRA of the neck shows widely patent RICA and a 50% LICA  Duplex ultrasound of the bilateral carotid arteries today shows RICA 1-39% and 40-59% LICA. Left vertebral is antegrade and the right is chronically occluded.  Current Meds  Medication Sig   acetaminophen (TYLENOL) 325 MG tablet Take 2 tablets (650 mg total) by mouth every 4 (four) hours as needed for mild pain ((score 1 to 3) or temp > 100.5).   albuterol (VENTOLIN HFA) 108 (90 Base) MCG/ACT inhaler Inhale 2 puffs into the lungs every 6 (six) hours as needed for wheezing orshortness of breath.   azithromycin (ZITHROMAX) 250 MG tablet Take 1 tablet (250 mg total) by mouth every Monday, Wednesday, and Friday. Take 2 tablets (500 mg) on  Day 1,  followed by 1 tablet (250 mg) once daily on Days 2 through 5.   BREZTRI AEROSPHERE 160-9-4.8 MCG/ACT AERO Inhale 2 puffs into the lungs in the morning and at bedtime.   diphenhydrAMINE (BENADRYL ALLERGY EXTRA STR)  50 MG tablet Take 1 tablet (50 mg total) by mouth at bedtime as needed for itching. For CT Angiogram due to allergy to contrast.  Take one tablet (50 MG) by mouth one hour before CT angiogram performed.   finasteride (PROSCAR) 5 MG tablet Take 1 tablet (5 mg total) by mouth daily.   hydrocortisone 2.5 % cream Apply twice daily up to 2 weeks as needed for rash/itching on face   ipratropium-albuterol (DUONEB) 0.5-2.5 (3) MG/3ML SOLN Take 3 mLs by nebulization every 4 (four) hours as needed.   olmesartan-hydrochlorothiazide (BENICAR HCT) 40-25 MG tablet Take 1 tablet by mouth daily.   omeprazole (PRILOSEC) 40 MG capsule Take 1 capsule (40 mg total) by mouth in the morning and at bedtime.   rosuvastatin (CRESTOR) 10 MG tablet Take 1 tablet (10 mg total) by mouth daily.   Spacer/Aero-Holding Chambers (AEROCHAMBER MV) inhaler Use as instructed   tadalafil (CIALIS) 5 MG tablet TAKE ONE TABLET BY MOUTH DAILY AS NEEDED FOR ERECTILE DYSFUNCTION   tamsulosin (FLOMAX) 0.4 MG CAPS capsule Take 1 capsule (0.4 mg total) by mouth 2 (two) times daily.   triamcinolone cream (KENALOG) 0.1 % Apply 1 Application topically 2 (two) times daily.    Past Medical History:  Diagnosis Date   Angioedema 08/07/2019   Arthritis    knees,shoulders, ankles, most joints   Cervical disc disorder    difficuly moving neck left   COPD (chronic obstructive pulmonary disease) (  HCC)    chronic cough and wheezing   Dyspnea    easily   GERD (gastroesophageal reflux disease)    Headache    Hemorrhoids    History of hiatal hernia    Hypertension    controlled on meds   Pneumonia 04/2019   Prostate disorder    PTSD (post-traumatic stress disorder)    Schizophrenia (HCC)    Per patient's report.    Past Surgical History:  Procedure Laterality Date   ANTERIOR CERVICAL DECOMP/DISCECTOMY FUSION N/A 08/21/2022   Procedure: C5-6 ANTERIOR CERVICAL DISCECTOMY AND FUSION (GLOBUS HEDRON);  Surgeon: Venetia Night, MD;  Location:  [facility];  Service: Neurosurgery;  Laterality: N/A;   CATARACT EXTRACTION W/PHACO Left 03/28/2020   Procedure: CATARACT EXTRACTION PHACO AND INTRAOCULAR LENS PLACEMENT (IOC) LEFT MALYUGIN  MILOOP,   5.29  00:41.0;  Surgeon: Galen Manila, MD;  Location: [facility];  Service: Ophthalmology;  Laterality: Left;   COLONOSCOPY WITH PROPOFOL N/A 12/14/2020   Procedure: COLONOSCOPY WITH PROPOFOL;  Surgeon: Wyline Mood, MD;  Location: [facility];  Service: Gastroenterology;  Laterality: N/A;   ESOPHAGOGASTRODUODENOSCOPY (EGD) WITH PROPOFOL N/A 10/18/2021   Procedure: ESOPHAGOGASTRODUODENOSCOPY (EGD) WITH PROPOFOL;  Surgeon: Pasty Spillers, MD;  Location: [facility] ENDOSCOPY;  Service: Endoscopy;  Laterality: N/A;   EYE SURGERY Left    injury from a nail   FOOT SURGERY Left     Social History Social History   Tobacco Use   Smoking status: Every Day    Current packs/day: 1.50    Average packs/day: 1.5 packs/day for 45.0 years (67.5 ttl pk-yrs)    Types: Cigarettes   Smokeless tobacco: Never   Tobacco comments:    1.5 to 2 PPD- khj 02/09/2024    Started smoking at 63 years old    Smoked 1.5- 2 PPD at his heaviest.  Vaping Use   Vaping status: Never Used  Substance Use Topics   Alcohol use: Yes    Comment: 8 to 12 cans of beer daily   Drug use: Never    Family History Family History  Problem Relation Age of Onset   Heart disease Mother    Osteoporosis Mother    Heart disease Father    Emphysema Father    Heart disease Sister    Emphysema Maternal Grandmother    Heart disease Maternal Grandfather    Osteoporosis Sister     Allergies  Allergen Reactions   Lisinopril Swelling    Reaction: angioedema   Iodinated Contrast Media Hives   Amlodipine     Dizziness and edema with this   Penicillins Rash    Reaction: Feels like skin is on fire Did it involve swelling of the face/tongue/throat, SOB, or low BP? No Did it involve sudden or severe rash/hives, skin peeling,  or any reaction on the inside of your mouth or nose? No Did you need to seek medical attention at a hospital or doctor's office? Yes When did it last happen? 30 years ago If all above answers are "NO", may proceed with cephalosporin use.      REVIEW OF SYSTEMS (Negative unless checked)  Constitutional: [] Weight loss  [] Fever  [] Chills Cardiac: [] Chest pain   [] Chest pressure   [] Palpitations   [] Shortness of breath when laying flat   [] Shortness of breath with exertion. Vascular:  [x] Pain in legs with walking   [] Pain in legs at rest  [] History of DVT   [] Phlebitis   [] Swelling in legs   [] Varicose veins   [] Non-healing  ulcers Pulmonary:   [] Uses home oxygen   [] Productive cough   [] Hemoptysis   [] Wheeze  [x] COPD   [] Asthma Neurologic:  [] Dizziness   [] Seizures   [] History of stroke   [] History of TIA  [] Aphasia   [] Vissual changes   [] Weakness or numbness in arm   [] Weakness or numbness in leg Musculoskeletal:   [] Joint swelling   [x] Joint pain   [] Low back pain Hematologic:  [] Easy bruising  [] Easy bleeding   [] Hypercoagulable state   [] Anemic Gastrointestinal:  [] Diarrhea   [] Vomiting  [x] Gastroesophageal reflux/heartburn   [] Difficulty swallowing. Genitourinary:  [] Chronic kidney disease   [] Difficult urination  [] Frequent urination   [] Blood in urine Skin:  [] Rashes   [] Ulcers  Psychological:  [] History of anxiety   []  History of major depression.  Physical Examination  Vitals:   03/01/24 1317  BP: (!) 163/97  Pulse: 82  Resp: 18  Weight: 189 lb 3.2 oz (85.8 kg)  Height: 5\' 10"  (1.778 m)   Body mass index is 27.15 kg/m. Gen: WD/WN, NAD Head: [place]/AT, No temporalis wasting.  Ear/Nose/Throat: Hearing grossly intact, nares w/o erythema or drainage Eyes: PER, EOMI, sclera nonicteric.  Neck: Supple, no masses.  No bruit or JVD.  Pulmonary:  Good air movement, no audible wheezing, no use of accessory muscles.  Cardiac: RRR, normal S1, S2, no Murmurs. Vascular:  carotid bruit  noted Vessel Right Left  Radial Palpable Palpable  Carotid  Palpable  Palpable  Subclav  Palpable Palpable  Gastrointestinal: soft, non-distended. No guarding/no peritoneal signs.  Musculoskeletal: M/S 5/5 throughout.  No visible deformity.  Neurologic: CN 2-12 intact. Pain and light touch intact in extremities.  Symmetrical.  Speech is fluent. Motor exam as listed above. Psychiatric: Judgment intact, Mood & affect appropriate for pt's clinical situation. Dermatologic: No rashes or ulcers noted.  No changes consistent with cellulitis.   CBC Lab Results  Component Value Date   WBC 8.8 12/24/2023   HGB 15.8 12/24/2023   HCT 46.8 12/24/2023   MCV 92 12/24/2023   PLT 307 12/24/2023    BMET    Component Value Date/Time   NA 138 12/24/2023 0951   K 4.1 12/24/2023 0951   CL 99 12/24/2023 0951   CO2 24 12/24/2023 0951   GLUCOSE 85 12/24/2023 0951   GLUCOSE 90 08/14/2023 0913   BUN 13 12/24/2023 0951   CREATININE 0.94 12/24/2023 0951   CALCIUM 9.1 12/24/2023 0951   GFRNONAA >60 08/14/2023 0913   GFRAA 113 01/10/2021 1502   CrCl cannot be calculated (Patient's most recent lab result is older than the maximum 21 days allowed.).  COAG No results found for: "INR", "PROTIME"  Radiology No results found.   Assessment/Plan 1. Bilateral carotid artery stenosis Recommend:   Given the patient's asymptomatic subcritical stenosis no further invasive testing or surgery at this time.   Duplex ultrasound shows 1-39% RICA and 40-59% LICA stenosis.   Continue antiplatelet therapy as prescribed Continue management of CAD, HTN and Hyperlipidemia Healthy heart diet,  encouraged exercise at least 4 times per week Follow up in 12 months with duplex ultrasound and physical exam.  2. PAD (peripheral artery disease)  Recommend:  The patient has evidence of atherosclerosis of the lower extremities with no claudication.  The patient does not voice lifestyle limiting changes at this point in  time.  Noninvasive studies do not suggest clinically significant change.  No invasive studies, angiography or surgery at this time The patient should continue walking and begin a more formal  exercise program.  The patient should continue antiplatelet therapy and aggressive treatment of the lipid abnormalities  No changes in the patient's medications at this time  Continued surveillance is indicated as atherosclerosis is likely to progress with time.    The patient will continue follow up with noninvasive studies as ordered.   3. Essential hypertension Continue antihypertensive medications as already ordered, these medications have been reviewed and there are no changes at this time.    4. Mixed hyperlipidemia Continue statin as ordered and reviewed, no changes at this time     Valene Gash, NP  03/01/2024 11:59 PM

## 2024-03-09 ENCOUNTER — Ambulatory Visit: Payer: MEDICAID | Attending: Otolaryngology

## 2024-03-09 DIAGNOSIS — G4761 Periodic limb movement disorder: Secondary | ICD-10-CM | POA: Insufficient documentation

## 2024-03-09 DIAGNOSIS — J449 Chronic obstructive pulmonary disease, unspecified: Secondary | ICD-10-CM | POA: Diagnosis not present

## 2024-03-09 DIAGNOSIS — G4733 Obstructive sleep apnea (adult) (pediatric): Secondary | ICD-10-CM | POA: Diagnosis present

## 2024-03-09 DIAGNOSIS — E669 Obesity, unspecified: Secondary | ICD-10-CM | POA: Insufficient documentation

## 2024-03-09 DIAGNOSIS — Z6826 Body mass index (BMI) 26.0-26.9, adult: Secondary | ICD-10-CM | POA: Insufficient documentation

## 2024-03-09 DIAGNOSIS — I10 Essential (primary) hypertension: Secondary | ICD-10-CM | POA: Insufficient documentation

## 2024-03-17 DIAGNOSIS — G4733 Obstructive sleep apnea (adult) (pediatric): Secondary | ICD-10-CM | POA: Diagnosis not present

## 2024-04-06 ENCOUNTER — Encounter (INDEPENDENT_AMBULATORY_CARE_PROVIDER_SITE_OTHER): Payer: Self-pay

## 2024-04-06 ENCOUNTER — Ambulatory Visit (INDEPENDENT_AMBULATORY_CARE_PROVIDER_SITE_OTHER): Payer: MEDICAID | Admitting: Sleep Medicine

## 2024-04-06 NOTE — Telephone Encounter (Signed)
 PSG results relayed to patient.

## 2024-04-30 ENCOUNTER — Ambulatory Visit (INDEPENDENT_AMBULATORY_CARE_PROVIDER_SITE_OTHER): Payer: MEDICAID | Admitting: Urology

## 2024-04-30 ENCOUNTER — Encounter: Payer: Self-pay | Admitting: Urology

## 2024-04-30 VITALS — BP 164/97 | HR 78

## 2024-04-30 DIAGNOSIS — N5201 Erectile dysfunction due to arterial insufficiency: Secondary | ICD-10-CM | POA: Diagnosis not present

## 2024-04-30 DIAGNOSIS — N401 Enlarged prostate with lower urinary tract symptoms: Secondary | ICD-10-CM | POA: Diagnosis not present

## 2024-04-30 LAB — URINALYSIS, COMPLETE
Bilirubin, UA: NEGATIVE
Glucose, UA: NEGATIVE
Ketones, UA: NEGATIVE
Leukocytes,UA: NEGATIVE
Nitrite, UA: NEGATIVE
Protein,UA: NEGATIVE
RBC, UA: NEGATIVE
Specific Gravity, UA: 1.025 (ref 1.005–1.030)
Urobilinogen, Ur: 0.2 mg/dL (ref 0.2–1.0)
pH, UA: 5.5 (ref 5.0–7.5)

## 2024-04-30 LAB — BLADDER SCAN AMB NON-IMAGING: Scan Result: 32

## 2024-04-30 LAB — MICROSCOPIC EXAMINATION: Bacteria, UA: NONE SEEN

## 2024-04-30 NOTE — Patient Instructions (Signed)
 Transrectal Ultrasound  A transrectal ultrasound is a procedure that uses sound waves to create images of the prostate gland and nearby tissues. For this procedure, an ultrasound probe is placed in the rectum. The probe sends sound waves through the wall of the rectum into the prostate gland. The prostate is a walnut-sized gland that is located below the bladder and in front of the rectum. The images show the size and shape of the prostate gland and nearby structures. You may need this test if you have: Trouble urinating. Trouble getting your partner pregnant (infertility). An abnormal result from a prostate screening exam. Tell a health care provider about: Any allergies you have. All medicines you are taking, including vitamins, herbs, eye drops, creams, and over-the-counter medicines. Any bleeding problems you have. Any surgeries you have had. Any medical conditions you have. Any prostate infections you have had. What are the risks? Generally, this is a safe procedure. However, problems may occur, including: Discomfort during the procedure. Blood in your urine or sperm after the procedure. This may occur if a sample of tissue is taken to look at under a microscope (biopsy) during the procedure. What happens before the procedure? Your health care provider may instruct you to use an enema 1-4 hours before the procedure. Follow instructions from your health care provider about how to do the enema. Ask your health care provider about: Changing or stopping your regular medicines. This is especially important if you are taking diabetes medicines or blood thinners. Taking medicines such as aspirin and ibuprofen. These medicines can thin your blood. Do not take these medicines unless your health care provider tells you to take them. Taking over-the-counter medicines, vitamins, herbs, and supplements. What happens during the procedure? You will be asked to lie down on your left side on an exam  table. You will bend your knees toward your chest. Gel will be put on a small probe that is about the width of a finger. The probe will be gently inserted into your rectum. You may have a feeling of fullness but should not feel pain. The probe will send signals to a computer that will create images. These will be displayed on a monitor that looks like a small television screen. The technician will slightly rotate the probe throughout the procedure. While rotating the probe, he or she will view and capture images of the prostate gland and the surrounding structures from different angles. Your health care provider may take a biopsy sample of prostate tissue during the procedure. The images captured from the ultrasound will help guide the needle that is used to remove a sample of tissue. The sample will be sent to a lab for testing. The probe will be removed. The procedure may vary among health care providers and hospitals. What can I expect after the procedure? It is up to you to get the results of your procedure. Ask your health care provider, or the department that is doing the procedure, when your results will be ready. Keep all follow-up visits. This is important. Summary A transrectal ultrasound is a procedure that uses sound waves to create images of the prostate gland and nearby tissues. The images show the size and shape of the prostate gland and nearby structures. Before the procedure, ask your health care provider about changing or stopping your regular medicines. This is especially important if you are taking diabetes medicines or blood thinners. This information is not intended to replace advice given to you by your health care  provider. Make sure you discuss any questions you have with your health care provider. Document Revised: 07/18/2021 Document Reviewed: 04/30/2021 Elsevier Patient Education  2024 [employer]    Cystoscopy Cystoscopy is a procedure that is used to help  diagnose and sometimes treat conditions that affect the lower urinary tract. The lower urinary tract includes the bladder and the urethra. The urethra is the tube that drains urine from the bladder. Cystoscopy is done using a thin, tube-shaped instrument with a light and camera at the end (cystoscope). The cystoscope may be hard or flexible, depending on the goal of the procedure. The cystoscope is inserted through the urethra, into the bladder. Cystoscopy may be recommended if you have: Urinary tract infections that keep coming back. Blood in the urine (hematuria). An inability to control when you urinate (urinary incontinence) or an overactive bladder. Unusual cells found in a urine sample. A blockage in the urethra, such as a urinary stone. Painful urination. An abnormality in the bladder found during an intravenous pyelogram (IVP) or CT scan. What are the risks? Generally, this is a safe procedure. However, problems may occur, including: Infection. Bleeding.  What happens during the procedure?  You will be given one or more of the following: A medicine to numb the area (local anesthetic). The area around the opening of your urethra will be cleaned. The cystoscope will be passed through your urethra into your bladder. Germ-free (sterile) fluid will flow through the cystoscope to fill your bladder. The fluid will stretch your bladder so that your health care provider can clearly examine your bladder walls. Your doctor will look at the urethra and bladder. The cystoscope will be removed The procedure may vary among health care providers  What can I expect after the procedure? After the procedure, it is common to have: Some soreness or pain in your urethra. Urinary symptoms. These include: Mild pain or burning when you urinate. Pain should stop within a few minutes after you urinate. This may last for up to a few days after the procedure. A small amount of blood in your urine for several  days. Feeling like you need to urinate but producing only a small amount of urine. Follow these instructions at home: General instructions Return to your normal activities as told by your health care provider.  Drink plenty of fluids after the procedure. Keep all follow-up visits as told by your health care provider. This is important. Contact a health care provider if you: Have pain that gets worse or does not get better with medicine, especially pain when you urinate lasting longer than 72 hours after the procedure. Have trouble urinating. Get help right away if you: Have blood clots in your urine. Have a fever or chills. Are unable to urinate. Summary Cystoscopy is a procedure that is used to help diagnose and sometimes treat conditions that affect the lower urinary tract. Cystoscopy is done using a thin, tube-shaped instrument with a light and camera at the end. After the procedure, it is common to have some soreness or pain in your urethra. It is normal to have blood in your urine after the procedure.  If you were prescribed an antibiotic medicine, take it as told by your health care provider.  This information is not intended to replace advice given to you by your health care provider. Make sure you discuss any questions you have with your health care provider. Document Revised: 10/27/2018 Document Reviewed: 10/27/2018 Elsevier Patient Education  2020 [employer].

## 2024-04-30 NOTE — Progress Notes (Signed)
 04/30/2024 8:35 AM   Luke Lia. [birth year] [id number]  Referring provider: Lemar Pyles, NP [address] [place],  [place] [address]  Chief Complaint  Patient presents with   Follow-up   Urologic history: BPH with severe LUTS  Erectile dysfunction  HPI: Luke Kines. is a 63 y.o. male presents for annual follow-up.  Remains on Finasteride , Tamsulosin  0.8 mg, and Tadalafil  for BPH. No significant change in his symptoms since last year's visit; IPSS 23/35. Tadalafil  is working well for his ED. PSA performed by PCP September 2024 was 0.3   PMH: Past Medical History:  Diagnosis Date   Angioedema 08/07/2019   Arthritis    knees,shoulders, ankles, most joints   Cervical disc disorder    difficuly moving neck left   COPD (chronic obstructive pulmonary disease) (HCC)    chronic cough and wheezing   Dyspnea    easily   GERD (gastroesophageal reflux disease)    Headache    Hemorrhoids    History of hiatal hernia    Hypertension    controlled on meds   Pneumonia 04/2019   Prostate disorder    PTSD (post-traumatic stress disorder)    Schizophrenia (HCC)    Per patient's report.    Surgical History: Past Surgical History:  Procedure Laterality Date   ANTERIOR CERVICAL DECOMP/DISCECTOMY FUSION N/A 08/21/2022   Procedure: C5-6 ANTERIOR CERVICAL DISCECTOMY AND FUSION (GLOBUS HEDRON);  Surgeon: Jodeen Munch, MD;  Location: [facility];  Service: Neurosurgery;  Laterality: N/A;   CATARACT EXTRACTION W/PHACO Left 03/28/2020   Procedure: CATARACT EXTRACTION PHACO AND INTRAOCULAR LENS PLACEMENT (IOC) LEFT MALYUGIN  MILOOP,   5.29  00:41.0;  Surgeon: Clair Crews, MD;  Location: [facility];  Service: Ophthalmology;  Laterality: Left;   COLONOSCOPY WITH PROPOFOL  N/A 12/14/2020   Procedure: COLONOSCOPY WITH PROPOFOL ;  Surgeon: Luke Salaam, MD;  Location: [facility];  Service: Gastroenterology;  Laterality: N/A;   ESOPHAGOGASTRODUODENOSCOPY  (EGD) WITH PROPOFOL  N/A 10/18/2021   Procedure: ESOPHAGOGASTRODUODENOSCOPY (EGD) WITH PROPOFOL ;  Surgeon: Irby Mannan, MD;  Location: [facility];  Service: Endoscopy;  Laterality: N/A;   EYE SURGERY Left    injury from a nail   FOOT SURGERY Left     Home Medications:  Allergies as of 04/30/2024       Reactions   Lisinopril  Swelling   Reaction: angioedema   Iodinated Contrast Media Hives   Amlodipine     Dizziness and edema with this   Penicillins Rash   Reaction: Feels like skin is on fire Did it involve swelling of the face/tongue/throat, SOB, or low BP? No Did it involve sudden or severe rash/hives, skin peeling, or any reaction on the inside of your mouth or nose? No Did you need to seek medical attention at a hospital or doctor's office? Yes When did it last happen? 30 years ago If all above answers are "NO", may proceed with cephalosporin use.        Medication List        Accurate as of April 30, 2024  8:35 AM. If you have any questions, ask your nurse or doctor.          STOP taking these medications    carvedilol  3.125 MG tablet Commonly known as: COREG        TAKE these medications    acetaminophen  325 MG tablet Commonly known as: TYLENOL  Take 2 tablets (650 mg total) by mouth every 4 (four) hours as needed for mild pain ((  score 1 to 3) or temp > 100.5).   AeroChamber MV inhaler Use as instructed   albuterol  108 (90 Base) MCG/ACT inhaler Commonly known as: VENTOLIN  HFA Inhale 2 puffs into the lungs every 6 (six) hours as needed for wheezing orshortness of breath.   azithromycin  250 MG tablet Commonly known as: Zithromax  Take 1 tablet (250 mg total) by mouth every Monday, Wednesday, and Friday. Take 2 tablets (500 mg) on  Day 1,  followed by 1 tablet (250 mg) once daily on Days 2 through 5.   Breztri  Aerosphere 160-9-4.8 MCG/ACT Aero inhaler Generic drug: budesonide-glycopyrrolate -formoterol  Inhale 2 puffs into the lungs in the morning  and at bedtime.   diphenhydrAMINE  50 MG tablet Commonly known as: Benadryl  Allergy Extra Str Take 1 tablet (50 mg total) by mouth at bedtime as needed for itching. For CT Angiogram due to allergy to contrast.  Take one tablet (50 MG) by mouth one hour before CT angiogram performed.   finasteride  5 MG tablet Commonly known as: PROSCAR  Take 1 tablet (5 mg total) by mouth daily.   hydrocortisone  2.5 % cream Apply twice daily up to 2 weeks as needed for rash/itching on face   ipratropium-albuterol  0.5-2.5 (3) MG/3ML Soln Commonly known as: DUONEB Take 3 mLs by nebulization every 4 (four) hours as needed.   olmesartan -hydrochlorothiazide  40-25 MG tablet Commonly known as: Benicar  HCT Take 1 tablet by mouth daily.   omeprazole  40 MG capsule Commonly known as: PRILOSEC Take 1 capsule (40 mg total) by mouth in the morning and at bedtime.   rosuvastatin  10 MG tablet Commonly known as: Crestor  Take 1 tablet (10 mg total) by mouth daily.   tadalafil  5 MG tablet Commonly known as: CIALIS  TAKE ONE TABLET BY MOUTH DAILY AS NEEDED FOR ERECTILE DYSFUNCTION   tamsulosin  0.4 MG Caps capsule Commonly known as: FLOMAX  Take 1 capsule (0.4 mg total) by mouth 2 (two) times daily.   triamcinolone  cream 0.1 % Commonly known as: KENALOG  Apply 1 Application topically 2 (two) times daily.        Allergies:  Allergies  Allergen Reactions   Lisinopril  Swelling    Reaction: angioedema   Iodinated Contrast Media Hives   Amlodipine      Dizziness and edema with this   Penicillins Rash    Reaction: Feels like skin is on fire Did it involve swelling of the face/tongue/throat, SOB, or low BP? No Did it involve sudden or severe rash/hives, skin peeling, or any reaction on the inside of your mouth or nose? No Did you need to seek medical attention at a hospital or doctor's office? Yes When did it last happen? 30 years ago If all above answers are "NO", may proceed with cephalosporin use.      Family History: Family History  Problem Relation Age of Onset   Heart disease Mother    Osteoporosis Mother    Heart disease Father    Emphysema Father    Heart disease Sister    Emphysema Maternal Grandmother    Heart disease Maternal Grandfather    Osteoporosis Sister     Social History:  reports that he has been smoking cigarettes. He has a 67.5 pack-year smoking history. He has never used smokeless tobacco. He reports current alcohol use. He reports that he does not use drugs.   Physical Exam: BP (!) 164/97   Pulse 78   Constitutional:  Alert and oriented, No acute distress. HEENT: [place] AT Respiratory: Normal respiratory effort, no increased work of breathing.  Psychiatric: Normal mood and affect.   Assessment & Plan:    1. BPH with severe LUTS  PVR today 32 mL  He had previously declined outlet procedures however is interested in pursuing if it will improve his voiding symptoms Schedule cystoscopy/TRUS  2. Erectile dysfunction Stable on tadalafil    Geraline Knapp, MD  [facility] [address], [address] [place], [place] [address] [phone]

## 2024-05-01 DIAGNOSIS — G4733 Obstructive sleep apnea (adult) (pediatric): Secondary | ICD-10-CM | POA: Insufficient documentation

## 2024-05-01 NOTE — Patient Instructions (Signed)

## 2024-05-04 ENCOUNTER — Encounter: Payer: Self-pay | Admitting: Nurse Practitioner

## 2024-05-04 ENCOUNTER — Ambulatory Visit (INDEPENDENT_AMBULATORY_CARE_PROVIDER_SITE_OTHER): Payer: MEDICAID | Admitting: Nurse Practitioner

## 2024-05-04 VITALS — BP 138/86 | HR 81 | Ht 70.0 in | Wt 191.0 lb

## 2024-05-04 DIAGNOSIS — F10288 Alcohol dependence with other alcohol-induced disorder: Secondary | ICD-10-CM

## 2024-05-04 DIAGNOSIS — E871 Hypo-osmolality and hyponatremia: Secondary | ICD-10-CM

## 2024-05-04 DIAGNOSIS — E782 Mixed hyperlipidemia: Secondary | ICD-10-CM

## 2024-05-04 DIAGNOSIS — F17210 Nicotine dependence, cigarettes, uncomplicated: Secondary | ICD-10-CM

## 2024-05-04 DIAGNOSIS — I700 Atherosclerosis of aorta: Secondary | ICD-10-CM

## 2024-05-04 DIAGNOSIS — N401 Enlarged prostate with lower urinary tract symptoms: Secondary | ICD-10-CM

## 2024-05-04 DIAGNOSIS — I10 Essential (primary) hypertension: Secondary | ICD-10-CM

## 2024-05-04 DIAGNOSIS — F209 Schizophrenia, unspecified: Secondary | ICD-10-CM

## 2024-05-04 DIAGNOSIS — I739 Peripheral vascular disease, unspecified: Secondary | ICD-10-CM

## 2024-05-04 DIAGNOSIS — Z8673 Personal history of transient ischemic attack (TIA), and cerebral infarction without residual deficits: Secondary | ICD-10-CM

## 2024-05-04 DIAGNOSIS — J438 Other emphysema: Secondary | ICD-10-CM | POA: Diagnosis not present

## 2024-05-04 DIAGNOSIS — G4733 Obstructive sleep apnea (adult) (pediatric): Secondary | ICD-10-CM

## 2024-05-04 MED ORDER — BREZTRI AEROSPHERE 160-9-4.8 MCG/ACT IN AERO: 2.0000 | INHALATION_SPRAY | Freq: Two times a day (BID) | RESPIRATORY_TRACT | 4 refills | Status: DC

## 2024-05-04 NOTE — Progress Notes (Signed)
 BP 138/86 (BP Location: Left Arm, Patient Position: Sitting, Cuff Size: Normal)   Pulse 81   Ht 5' 10 (1.778 m)   Wt 191 lb (86.6 kg)   SpO2 99%   BMI 27.41 kg/m    Subjective:    Patient ID: Luke Lia., male    DOB: 11/13/1961, 63 y.o.   MRN: [id number]  HPI: Luke Whidbee. is a 63 y.o. male  Chief Complaint  Patient presents with   Medical Management of Chronic Issues   HYPERTENSION without Chronic Kidney Disease  Continues on Olmesartan /HCTZ 40/25 MG and Rosuvastatin . Last visit with cardiology was 12/29/23.  Echo noted EF 50-55% and G1 DD on 08/12/23. History of CVA.  Last saw vascular on 03/01/24. Ultrasound with RICA 1-39% and LICA 40-59%.   Ongoing drinking alcohol nightly -- 8-12 [place] a day, previous was 6-8 months ago.  Does take occasional night off. Reports drinking helps his tinnitus.  Feels worse if he does not drink.  Hypertension status: uncontrolled  Satisfied with current treatment? yes Duration of hypertension: chronic BP monitoring frequency:  not checking BP range:  BP medication side effects:  no Medication compliance: good compliance Aspirin : no Recurrent headaches: no Visual changes: no Palpitations: no Dyspnea: ongoing issue, follows pulmonary Chest pain: no Lower extremity edema: no Dizzy/lightheaded: occasional The 10-year ASCVD risk score (Arnett DK, et al., 2019) is: 13.2%   Values used to calculate the score:     Age: 57 years     Clincally relevant sex: Male     Is Non-Hispanic African American: No     Diabetic: No     Tobacco smoker: Yes     Systolic Blood Pressure: 138 mmHg     Is BP treated: Yes     HDL Cholesterol: 68 mg/dL     Total Cholesterol: 179 mg/dL   COPD Uses Breztri  BID and using Albuterol  BID.  Ongoing issue with fatigue which we have discussed at past visits, even vacuuming will make him out of breath.  Has history of low NA+ on labs, recent was stable. Continues to smoke 1 1/2 PPD.  Has smoked  for >45 years. Saw pulmonary last on 02/09/24, was given Azithromycin  to take MWF, but reports he has not been taking it consistently as read if smoking it does not help.  They also ordered pulmonary rehab, but cannot perform as has no ride 3 days a week. Considering biologics. COPD status: stable Satisfied with current treatment?: yes Oxygen  use: no Dyspnea frequency: yes Cough frequency: at baseline  Rescue inhaler frequency: every other day Limitation of activity: no Productive cough: none Last Spirometry: with pulmonary Pneumovax: Up to Date Influenza: Up to Date   BPH Follows with urology, last visit was 04/30/24.  They are considering surgery.  He reports they requested labs, but this is not seen on review of note. BPH status: uncontrolled Satisfied with current treatment?: yes Medication side effects: no Medication compliance: good compliance Duration: chronic Nocturia: 4-5x per night Urinary frequency:yes Incomplete voiding: no Urgency: yes Weak urinary stream: yes Straining to start stream: yes Dysuria: no Onset: gradual Severity: moderate Alleviating factors: nothing Aggravating factors: unknown Treatments attempted: Tamsulosin  and Finasteride  IPSS Questionnaire (AUA-7): Over the past month.   1)  How often have you had a sensation of not emptying your bladder completely after you finish urinating?  3 - About half the time  2)  How often have you had to urinate again less than two hours  after you finished urinating? 3 - About half the time  3)  How often have you found you stopped and started again several times when you urinated?  3 - About half the time  4) How difficult have you found it to postpone urination?  3 - About half the time  5) How often have you had a weak urinary stream?  3 - About half the time  6) How often have you had to push or strain to begin urination?  3 - About half the time  7) How many times did you most typically get up to urinate from the  time you went to bed until the time you got up in the morning?  4 - 4 times  Total score:  0-7 mildly symptomatic   8-19 moderately symptomatic   20-35 severely symptomatic     DEPRESSION No current medications. Mood status: stable Symptom severity: moderate  Psychotherapy/counseling: no none Depressed mood: yes Anxious mood: yes Anhedonia: no Significant weight loss or gain: no Insomnia: yes hard to fall asleep Fatigue: yes Feelings of worthlessness or guilt: yes Impaired concentration/indecisiveness: no Suicidal ideations: fleeting on occasion, but will not act on it as he wants to go to heaven Hopelessness: yes Crying spells: yes    05/04/2024    8:00 AM 01/30/2024    8:37 AM 12/24/2023    9:03 AM 08/14/2023    8:27 AM 07/25/2023    9:24 AM  Depression screen PHQ 2/9  Decreased Interest 2 2 3 3 3   Down, Depressed, Hopeless 2 3 2 3 3   PHQ - 2 Score 4 5 5 6 6   Altered sleeping 2 2 2 3 3   Tired, decreased energy 3 3 3 3 3   Change in appetite 1 2 1 3 1   Feeling bad or failure about yourself  2 3 2 1 2   Trouble concentrating 2 2 1 3 3   Moving slowly or fidgety/restless 2 0 0 2 3  Suicidal thoughts 0 2 0 0 0  PHQ-9 Score 16 19 14 21 21   Difficult doing work/chores Very difficult Very difficult Very difficult Extremely dIfficult Very difficult       05/04/2024    8:01 AM 01/30/2024    8:37 AM 12/24/2023    9:03 AM 08/14/2023    8:27 AM  GAD 7 : Generalized Anxiety Score  Nervous, Anxious, on Edge 2 2 1 2   Control/stop worrying 2 1 2 2   Worry too much - different things 2 2 2 2   Trouble relaxing 2 2 3 2   Restless 1 1 0 1  Easily annoyed or irritable 2 2 1 3   Afraid - awful might happen 2 3 2 3   Total GAD 7 Score 13 13 11 15   Anxiety Difficulty Very difficult Very difficult Somewhat difficult Very difficult   Relevant past medical, surgical, family and social history reviewed and updated as indicated. Interim medical history since our last visit reviewed. Allergies and  medications reviewed and updated.  Review of Systems  Constitutional:  Positive for fatigue. Negative for activity change, diaphoresis and fever.  Respiratory:  Positive for cough (chronic) and shortness of breath (at baseline, no worse). Negative for chest tightness and wheezing.   Cardiovascular:  Negative for chest pain, palpitations and leg swelling.  Gastrointestinal: Negative.   Musculoskeletal: Negative.   Skin: Negative.   Neurological: Negative.   Psychiatric/Behavioral: Negative.     Per HPI unless specifically indicated above     Objective:  BP 138/86 (BP Location: Left Arm, Patient Position: Sitting, Cuff Size: Normal)   Pulse 81   Ht 5' 10 (1.778 m)   Wt 191 lb (86.6 kg)   SpO2 99%   BMI 27.41 kg/m   Wt Readings from Last 3 Encounters:  05/04/24 191 lb (86.6 kg)  03/01/24 189 lb 3.2 oz (85.8 kg)  02/09/24 187 lb 9.6 oz (85.1 kg)    Physical Exam Vitals and nursing note reviewed.  Constitutional:      General: He is awake. He is not in acute distress.    Appearance: He is well-developed and well-groomed. He is not ill-appearing or toxic-appearing.  HENT:     Head: Normocephalic.     Right Ear: Hearing and external ear normal.     Left Ear: Hearing and external ear normal.   Eyes:     General: Lids are normal. No visual field deficit.    Extraocular Movements: Extraocular movements intact.     Conjunctiva/sclera: Conjunctivae normal.   Neck:     Thyroid: No thyromegaly.     Vascular: No carotid bruit.   Cardiovascular:     Rate and Rhythm: Normal rate and regular rhythm.     Heart sounds: Normal heart sounds. No murmur heard.    No gallop.  Pulmonary:     Effort: Pulmonary effort is normal. No accessory muscle usage or respiratory distress.     Breath sounds: Wheezing present. No decreased breath sounds or rales.     Comments: Expiratory wheezes noted throughout, per baseline. Abdominal:     General: Bowel sounds are normal. There is no  distension.     Palpations: Abdomen is soft.     Tenderness: There is no abdominal tenderness.   Musculoskeletal:     Cervical back: Full passive range of motion without pain.     Right lower leg: No edema.     Left lower leg: No edema.  Lymphadenopathy:     Cervical: No cervical adenopathy.   Skin:    General: Skin is warm.     Capillary Refill: Capillary refill takes less than 2 seconds.     Findings: No rash.   Neurological:     Mental Status: He is alert and oriented to person, place, and time.     Cranial Nerves: No facial asymmetry.     Sensory: No sensory deficit.     Motor: Tremor (both hands, baseline) present. No weakness.     Coordination: Coordination is intact.     Gait: Gait is intact.     Deep Tendon Reflexes: Reflexes are normal and symmetric.     Reflex Scores:      Brachioradialis reflexes are 2+ on the right side and 2+ on the left side.      Patellar reflexes are 2+ on the right side and 2+ on the left side.  Psychiatric:        Attention and Perception: Attention normal.        Mood and Affect: Mood normal.        Speech: Speech normal.        Behavior: Behavior is cooperative.        Thought Content: Thought content normal.    Results for orders placed or performed in visit on 04/30/24  Microscopic Examination   Collection Time: 04/30/24  8:12 AM   Urine  Result Value Ref Range   WBC, UA 0-5 0 - 5 /hpf   RBC, Urine 0-2 0 - 2 /hpf  Epithelial Cells (non renal) 0-10 0 - 10 /hpf   Bacteria, UA None seen None seen/Few  Urinalysis, Complete   Collection Time: 04/30/24  8:12 AM  Result Value Ref Range   Specific Gravity, UA 1.025 1.005 - 1.030   pH, UA 5.5 5.0 - 7.5   Color, UA Yellow Yellow   Appearance Ur Clear Clear   Leukocytes,UA Negative Negative   Protein,UA Negative Negative/Trace   Glucose, UA Negative Negative   Ketones, UA Negative Negative   RBC, UA Negative Negative   Bilirubin, UA Negative Negative   Urobilinogen, Ur 0.2 0.2 -  1.0 mg/dL   Nitrite, UA Negative Negative   Microscopic Examination Comment    Microscopic Examination See below:   BLADDER SCAN AMB NON-IMAGING   Collection Time: 04/30/24  8:17 AM  Result Value Ref Range   Scan Result 32 ml       Assessment & Plan:   Problem List Items Addressed This Visit       Cardiovascular and Mediastinum   PAD (peripheral artery disease) (HCC)   Chronic, ongoing.  Followed by vascular, recent notes reviewed.      Essential hypertension   Chronic, ongoing.  BP slightly above goal, does not always take medication before visits. Continue to recommend reduction to cessation of alcohol use, however he continues to drink heavily and refuses AA or medications.  Recommend cutting back on alcohol and smoking -- work towards complete cessation.  Recommend he monitor BP at least a few mornings a week at home and document.  DASH diet at home.  Labs today: CBC,CMP, TSH.  Could consider Hydralazine  in future.       Aortic atherosclerosis (HCC)   Chronic, noted on CT lung screening.  Continue statin therapy + recommend complete cessation of smoking.  Lipid panel today.        Respiratory   Paraseptal emphysema (HCC) - Primary   Chronic, ongoing. Continue to collaborate with pulmonary and highly recommend he restart the Azithromycin  they ordered 3 days a week, educated him on why this was ordered.  Also discussed that alcohol can effect the abx, to cut back.  Continue Breztri  BID and Albuterol  as needed at this time.  Continue yearly lung screening.  Recommend complete cessation of smoking.       Relevant Medications   budesonide-glycopyrrolate -formoterol  (BREZTRI  AEROSPHERE) 160-9-4.8 MCG/ACT AERO inhaler   OSA (obstructive sleep apnea)   Diagnosed on 04/06/24, mild.  Recommended CPAP by pulmonary and has not obtained.        Genitourinary   BPH (benign prostatic hyperplasia)   Chronic, ongoing.  Is taking Finasteride  and Flomax  daily.  Continue to collaborate with  urology. PSA today on labs.      Relevant Orders   PSA     Other   Schizophrenia (HCC)   Chronic, ongoing.  No current medications.  Tolerating this and does not wish to start medication.  Could consider Seroquel in future if needed or return to Clonidine .  Denies SI/HI.      Nicotine  dependence   I have recommended complete cessation of tobacco use. I have discussed various options available for assistance with tobacco cessation including over the counter methods (Nicotine  gum, patch and lozenges). We also discussed prescription options (Chantix, Nicotine  Inhaler / Nasal Spray). The patient is not interested in pursuing any prescription tobacco cessation options at this time.  Continue yearly lung CA screening.       Mixed hyperlipidemia   Chronic, ongoing.  Recommend  he continue Rosuvastatin  daily and will adjust as needed.  Lipid panel today.       Relevant Orders   Comprehensive metabolic panel with GFR   Lipid Panel w/o Chol/HDL Ratio   Hyponatremia   Chronic and ongoing due to heavier alcohol intake.  Highly recommend he cut back on this.  Also drinks a lot of water with this.  Check level today.      Relevant Orders   Comprehensive metabolic panel with GFR   History of lacunar cerebrovascular accident   History of infarct, continue collaboration with neuro and ENT for ongoing tinnitus, headaches, and dizziness.  Recent MRI remains stable.  Highly recommend he return for neurology visits.      Alcohol dependence (HCC)   Ongoing and endorses at visits that he feels bad all the time.  Recommend cutting back -- concern for overall health with ongoing heavy use and suspect major reason for his overall feeling bad at baseline although he feels it makes him feel better.  Main trigger is his significant other also drinks.  Is aware of effect of heavy alcohol use on overall health, especially BP and overall health.   LABS: CBC and CMP.  Refuses AA or medication at this time. We  discussed at length that if ongoing heavy alcohol use it could lead to poor outcomes in the future.      Relevant Orders   CBC with Differential/Platelet   TSH      Follow up plan: Return in about 3 months (around 08/04/2024) for HTN/HLD, COPD, MOOD, ALCOHOL ABUSE.

## 2024-05-04 NOTE — Assessment & Plan Note (Signed)
Chronic, ongoing.  No current medications.  Tolerating this and does not wish to start medication.  Could consider Seroquel in future if needed or return to Clonidine.  Denies SI/HI.

## 2024-05-04 NOTE — Assessment & Plan Note (Signed)
 Chronic and ongoing due to heavier alcohol intake.  Highly recommend he cut back on this.  Also drinks a lot of water with this.  Check level today.

## 2024-05-04 NOTE — Assessment & Plan Note (Signed)
Chronic, noted on CT lung screening.  Continue statin therapy + recommend complete cessation of smoking.  Lipid panel today. 

## 2024-05-04 NOTE — Assessment & Plan Note (Signed)
 Chronic, ongoing. Continue to collaborate with pulmonary and highly recommend he restart the Azithromycin  they ordered 3 days a week, educated him on why this was ordered.  Also discussed that alcohol can effect the abx, to cut back.  Continue Breztri  BID and Albuterol  as needed at this time.  Continue yearly lung screening.  Recommend complete cessation of smoking.

## 2024-05-04 NOTE — Assessment & Plan Note (Signed)
Chronic, ongoing.  Is taking Finasteride and Flomax daily.  Continue to collaborate with urology. PSA today on labs.

## 2024-05-04 NOTE — Assessment & Plan Note (Signed)
I have recommended complete cessation of tobacco use. I have discussed various options available for assistance with tobacco cessation including over the counter methods (Nicotine gum, patch and lozenges). We also discussed prescription options (Chantix, Nicotine Inhaler / Nasal Spray). The patient is not interested in pursuing any prescription tobacco cessation options at this time.  Continue yearly lung CA screening.

## 2024-05-04 NOTE — Assessment & Plan Note (Signed)
 Ongoing and endorses at visits that he feels bad all the time.  Recommend cutting back -- concern for overall health with ongoing heavy use and suspect major reason for his overall feeling bad at baseline although he feels it makes him feel better.  Main trigger is his significant other also drinks.  Is aware of effect of heavy alcohol use on overall health, especially BP and overall health.   LABS: CBC and CMP.  Refuses AA or medication at this time. We discussed at length that if ongoing heavy alcohol use it could lead to poor outcomes in the future.

## 2024-05-04 NOTE — Assessment & Plan Note (Signed)
Chronic, ongoing.  Followed by vascular, recent notes reviewed.

## 2024-05-04 NOTE — Assessment & Plan Note (Signed)
 Chronic, ongoing.  Recommend he continue Rosuvastatin  daily and will adjust as needed.  Lipid panel today.

## 2024-05-04 NOTE — Assessment & Plan Note (Signed)
 Diagnosed on 04/06/24, mild.  Recommended CPAP by pulmonary and has not obtained.

## 2024-05-04 NOTE — Assessment & Plan Note (Signed)
 History of infarct, continue collaboration with neuro and ENT for ongoing tinnitus, headaches, and dizziness.  Recent MRI remains stable.  Highly recommend he return for neurology visits.

## 2024-05-04 NOTE — Assessment & Plan Note (Signed)
 Chronic, ongoing.  BP slightly above goal, does not always take medication before visits. Continue to recommend reduction to cessation of alcohol use, however he continues to drink heavily and refuses AA or medications.  Recommend cutting back on alcohol and smoking -- work towards complete cessation.  Recommend he monitor BP at least a few mornings a week at home and document.  DASH diet at home.  Labs today: CBC,CMP, TSH.  Could consider Hydralazine  in future.

## 2024-05-05 ENCOUNTER — Ambulatory Visit: Payer: Self-pay | Admitting: Nurse Practitioner

## 2024-05-05 DIAGNOSIS — E871 Hypo-osmolality and hyponatremia: Secondary | ICD-10-CM

## 2024-05-05 LAB — TSH: TSH: 0.875 u[IU]/mL (ref 0.450–4.500)

## 2024-05-05 LAB — LIPID PANEL W/O CHOL/HDL RATIO
Cholesterol, Total: 181 mg/dL (ref 100–199)
HDL: 64 mg/dL (ref >39)
LDL Chol Calc (NIH): 102 mg/dL — ABNORMAL HIGH (ref 0–99)
Triglycerides: 82 mg/dL (ref 0–149)
VLDL Cholesterol Cal: 15 mg/dL (ref 5–40)

## 2024-05-05 LAB — CBC WITH DIFFERENTIAL/PLATELET
Basophils Absolute: 0 10*3/uL (ref 0.0–0.2)
Basos: 0 %
EOS (ABSOLUTE): 0.1 10*3/uL (ref 0.0–0.4)
Eos: 1 %
Hematocrit: 45.3 % (ref 37.5–51.0)
Hemoglobin: 15.1 g/dL (ref 13.0–17.7)
Immature Grans (Abs): 0 10*3/uL (ref 0.0–0.1)
Immature Granulocytes: 0 %
Lymphocytes Absolute: 2.2 10*3/uL (ref 0.7–3.1)
Lymphs: 25 %
MCH: 31.1 pg (ref 26.6–33.0)
MCHC: 33.3 g/dL (ref 31.5–35.7)
MCV: 93 fL (ref 79–97)
Monocytes Absolute: 0.9 10*3/uL (ref 0.1–0.9)
Monocytes: 10 %
Neutrophils Absolute: 5.8 10*3/uL (ref 1.4–7.0)
Neutrophils: 64 %
Platelets: 331 10*3/uL (ref 150–450)
RBC: 4.85 x10E6/uL (ref 4.14–5.80)
RDW: 13 % (ref 11.6–15.4)
WBC: 9 10*3/uL (ref 3.4–10.8)

## 2024-05-05 LAB — COMPREHENSIVE METABOLIC PANEL WITH GFR
ALT: 17 IU/L (ref 0–44)
AST: 21 IU/L (ref 0–40)
Albumin: 4.3 g/dL (ref 3.8–4.9)
Alkaline Phosphatase: 87 IU/L (ref 44–121)
BUN/Creatinine Ratio: 11 (ref 10–24)
BUN: 11 mg/dL (ref 8–27)
Bilirubin Total: 0.9 mg/dL (ref 0.0–1.2)
CO2: 22 mmol/L (ref 20–29)
Calcium: 9.2 mg/dL (ref 8.6–10.2)
Chloride: 89 mmol/L — ABNORMAL LOW (ref 96–106)
Creatinine, Ser: 0.99 mg/dL (ref 0.76–1.27)
Globulin, Total: 2.9 g/dL (ref 1.5–4.5)
Glucose: 80 mg/dL (ref 70–99)
Potassium: 4.2 mmol/L (ref 3.5–5.2)
Sodium: 129 mmol/L — ABNORMAL LOW (ref 134–144)
Total Protein: 7.2 g/dL (ref 6.0–8.5)
eGFR: 87 mL/min/{1.73_m2} (ref >59)

## 2024-05-05 LAB — PSA: Prostate Specific Ag, Serum: 0.6 ng/mL (ref 0.0–4.0)

## 2024-05-05 NOTE — Progress Notes (Signed)
 Contacted via MyChart -- lab only visit in one week please  Good afternoon Luke Briggs, your labs have returned: - Kidney function, creatinine and eGFR, remains normal, as is liver function, AST and ALT.  - Sodium is low, please cut back on alcohol use and reduce water intake a little.  Due to your blood pressure I do not want to add too much such, but please add just a little tablet salt daily.  We will recheck outpatient in one week please. - LDL, bad cholesterol, is above goal for stroke prevention.  Are you taking Rosuvastatin  consistently? Please let me know because if you are we will need to increase dose. - Remainder of labs stable.  Any questions? Keep being amazing!!  Thank you for allowing me to participate in your care.  I appreciate you. Kindest regards, Sanika Brosious

## 2024-05-06 ENCOUNTER — Telehealth: Payer: Self-pay

## 2024-05-06 NOTE — Telephone Encounter (Signed)
-----   Message from [name] T [name] sent at 05/04/2024  8:29 AM EDT ----- When you return can you order a BP machine for him to [facility] supply.  Thank you:)

## 2024-05-06 NOTE — Telephone Encounter (Signed)
 Blood pressure monitor ordered for the patient on [place].

## 2024-05-07 NOTE — Progress Notes (Signed)
 Lab appt scheduled.

## 2024-05-14 ENCOUNTER — Other Ambulatory Visit: Payer: MEDICAID

## 2024-05-14 DIAGNOSIS — E871 Hypo-osmolality and hyponatremia: Secondary | ICD-10-CM

## 2024-05-15 ENCOUNTER — Ambulatory Visit: Payer: Self-pay | Admitting: Nurse Practitioner

## 2024-05-15 DIAGNOSIS — E871 Hypo-osmolality and hyponatremia: Secondary | ICD-10-CM

## 2024-05-15 LAB — SODIUM: Sodium: 128 mmol/L — ABNORMAL LOW (ref 134–144)

## 2024-05-15 NOTE — Progress Notes (Signed)
 Contacted via MyChart -- lab visit only in one week please  Good evening Wael, your sodium has returned and continues to be low in the 120's.  Pleas ensure you are adding just a little tablet salt to diet, but most important please cut back some on alcohol use as I suspect this is the biggest culprit of the low levels.  My goal is to avoid sending you to the ER for replacement, but low sodium can cause a lot of symptoms: fatigue (which you have often), dizziness (which you have often), thirst, headaches, nausea, muscle cramps, and mental confusion. IF severe you could have seizures and a coma. So we need to get this improved.  I would like to recheck again in one week.  Any questions? Keep being stellar!!  Thank you for allowing me to participate in your care.  I appreciate you. Kindest regards, Laina Guerrieri

## 2024-05-27 ENCOUNTER — Telehealth: Payer: Self-pay

## 2024-05-27 ENCOUNTER — Other Ambulatory Visit: Payer: MEDICAID

## 2024-05-27 ENCOUNTER — Ambulatory Visit: Payer: MEDICAID | Admitting: Pulmonary Disease

## 2024-05-27 ENCOUNTER — Ambulatory Visit (INDEPENDENT_AMBULATORY_CARE_PROVIDER_SITE_OTHER): Payer: MEDICAID | Admitting: Pulmonary Disease

## 2024-05-27 ENCOUNTER — Encounter: Payer: Self-pay | Admitting: Pulmonary Disease

## 2024-05-27 VITALS — BP 120/78 | HR 90 | Temp 97.1°F | Ht 70.0 in | Wt 194.0 lb

## 2024-05-27 DIAGNOSIS — J4489 Other specified chronic obstructive pulmonary disease: Secondary | ICD-10-CM

## 2024-05-27 DIAGNOSIS — J439 Emphysema, unspecified: Secondary | ICD-10-CM | POA: Diagnosis not present

## 2024-05-27 DIAGNOSIS — R0602 Shortness of breath: Secondary | ICD-10-CM | POA: Diagnosis not present

## 2024-05-27 DIAGNOSIS — E871 Hypo-osmolality and hyponatremia: Secondary | ICD-10-CM

## 2024-05-27 LAB — PULMONARY FUNCTION TEST
FEF 25-75 Pre: 1.9 L/s
FEF2575-%Pred-Pre: 63 %
FEV1-%Pred-Pre: 80 %
FEV1-Pre: 2.89 L
FEV1FVC-%Pred-Pre: 92 %
FEV6-%Pred-Pre: 88 %
FEV6-Pre: 4.05 L
FEV6FVC-%Pred-Pre: 103 %
FVC-%Pred-Pre: 86 %
FVC-Pre: 4.12 L
Pre FEV1/FVC ratio: 70 %
Pre FEV6/FVC Ratio: 98 %

## 2024-05-27 MED ORDER — FLUTICASONE PROPIONATE 50 MCG/ACT NA SUSP: 1.0000 | Freq: Every day | NASAL | 3 refills | Status: AC

## 2024-05-27 NOTE — Progress Notes (Signed)
 Just spirometry performed today.

## 2024-05-27 NOTE — Telephone Encounter (Signed)
 Patient was seen in the office today. Dr. Larinda Buttery has ordered Dupixent for the patient.  He has signed the form and it has been faxed to the pharmacy team for completion.

## 2024-05-27 NOTE — Progress Notes (Signed)
 Synopsis: Referred in  by Valerio Melanie DASEN, NP   Subjective:   PATIENT ID: Luke Briggs Luke Briggs. GENDER: male DOB: 02-15-1961, MRN: [id number]  Chief Complaint  Patient presents with   Follow-up    SOB. Wheezing at night. Cough with clear sputum.     HPI Case of a 63 year old male patient with a past medical history of heavy tobacco use, alcohol use disorder and COPD presenting today to the pulmonary clinic for ongoing worsening shortness of breath for the past 6 months.   He reports that he has been compliant with Breztri  2 puffs twice a day however his breathing has been getting gradually worse. To the point now where he notices it with basic ADL. Associated with cough and clear jelly like sputum production.   He does also report loud snoring and waking up gasping for air couple of times a night.   He had eczema in the past and was on dupixent .   CTA chest obtained by his PCP 07/2023 did not show any PE, but showed mild emphysema and signs of chronic bronchitis.   Echocardiogram 07/2023 with normal LVEF wo RWMA. Grade I Diastolic dysfunction.   Unfortunately he continues to smoke 2 packs per day and has been smoking for 49 years. He drinks 12 beers a night.   Strong family history of emphysema most notable in his father who was a heavy smoker.    OV 05/27/2024 - [name] is here to follow up regarding his breathing problem. He could not proceed with PFTS as he is very claustrophobic. Spirometry with normal FVC, FEV-1 at LLN, FEV-1/FVC ratio at LLN. PEFR is reduced. Started on Breztri  and azithromycin  suppressive therapy (01/2024) during the last visit with only minimal response. He does seem to have post nasal drip and I will start him on Flonase . Further more will obtain a CT chest to assess for parenchymal lung disease. Finally, I am inclined on starting him on dupixent  given history of eczema and typ2 inflammation.   ROS All systems were reviewed and are negative except for  the above.  Objective:   Vitals:   05/27/24 0933  BP: 120/78  Pulse: 90  Temp: (!) 97.1 F (36.2 C)  SpO2: 96%  Weight: 194 lb (88 kg)  Height: 5' 10 (1.778 m)   96% on RA BMI Readings from Last 3 Encounters:  05/27/24 27.84 kg/m  05/04/24 27.41 kg/m  03/01/24 27.15 kg/m   Wt Readings from Last 3 Encounters:  05/27/24 194 lb (88 kg)  05/04/24 191 lb (86.6 kg)  03/01/24 189 lb 3.2 oz (85.8 kg)    Physical Exam GEN: NAD HEENT: Supple Neck, Reactive Pupils, EOMI  CVS: Normal S1, Normal S2, RRR, No murmurs or ES appreciated  Lungs: Poor air movement but mostly clear. .  Abdomen: Soft, non tender, non distended, + BS  Extremities: Warm and well perfused, No edema  Skin: No suspicious lesions appreciated  Psych: Normal Affect  Ancillary Information   CBC    Component Value Date/Time   WBC 9.0 05/04/2024 0837   WBC 10.4 08/14/2023 0913   RBC 4.85 05/04/2024 0837   RBC 5.19 08/14/2023 0913   HGB 15.1 05/04/2024 0837   HCT 45.3 05/04/2024 0837   PLT 331 05/04/2024 0837   MCV 93 05/04/2024 0837   MCH 31.1 05/04/2024 0837   MCH 31.0 08/14/2023 0913   MCHC 33.3 05/04/2024 0837   MCHC 35.0 08/14/2023 0913   RDW 13.0 05/04/2024 0837  LYMPHSABS 2.2 05/04/2024 0837   MONOABS 1.0 08/14/2023 0913   EOSABS 0.1 05/04/2024 0837   BASOSABS 0.0 05/04/2024 0837        No data to display           Assessment & Plan:  Case of a 63 year old male patient with a past medical history of heavy tobacco use, alcohol use disorder and COPD presenting today to the pulmonary clinic for ongoing worsening shortness of breath for the past 6 months.   #COPD Stage I gp B with possible asthma overlap  #Chronic bronchitis #Allergic rhinitis   Spiro w/ FEV-1 2.9L 80% of predicted.   []  Agree with Breztri  2 puffs BID  []  c/w azithromycin  1 tab MWF for COPD associated chronic bronchitis.   []  Referral to start Dupixent  placed.  []  Flonase  1 spray each nostril daily.    #Mild OSA  RDI 11 Witness apneic episodes. Loud snoring and fatigue during the day.  Not interested in PAP therapy.   #Tobaco use disorder  Has 94 PPY. Already enrolled in LDCT next CT chest 07/2024. Not interested in smoking cessation but will try to cut down to 1 ppd.   RTC 3 months .  I spent 30 minutes caring for this patient today, including preparing to see the patient, obtaining a medical history , reviewing a separately obtained history, performing a medically appropriate examination and/or evaluation, counseling and educating the patient/family/caregiver, ordering medications, tests, or procedures, documenting clinical information in the electronic health record, and independently interpreting results (not separately reported/billed) and communicating results to the patient/family/caregiver  Darrin Barn, MD [facility] 05/27/2024 9:37 AM

## 2024-05-28 ENCOUNTER — Telehealth: Payer: Self-pay | Admitting: Pharmacist

## 2024-05-28 ENCOUNTER — Ambulatory Visit: Payer: Self-pay | Admitting: Nurse Practitioner

## 2024-05-28 DIAGNOSIS — J439 Emphysema, unspecified: Secondary | ICD-10-CM

## 2024-05-28 LAB — PHOSPHORUS: Phosphorus: 3 mg/dL (ref 2.8–4.1)

## 2024-05-28 LAB — SODIUM: Sodium: 132 mmol/L — ABNORMAL LOW (ref 134–144)

## 2024-05-28 NOTE — Telephone Encounter (Signed)
 Will await receipt of Dupixent  forms

## 2024-05-28 NOTE — Telephone Encounter (Signed)
 Awaiting receipt of Dupixent  forms via fax from [facility] office  Submitted a Prior Authorization request to [facility] for DUPIXENT  via CoverMyMeds. Will update once we receive a response.  Key: [id number]   Per automated response: An active authorization already exists for this patient. Proceed with prescribing the Medication or call Pharmacy Customer Care at [phone]. MPA: [id number] Effective: 11/04/2023 12:00:00 AM Terminate: 11/03/2024 12:00:00 AM  Patient appears to have previously taken Dupixent  through dermatology for atopic dermatitis and was transitioning to self-administration. However, Per derm note from 01/12/2024, patient had $4 copay with [facility] but did not feel comfortable sending money over the phone. Has concerns about the money going to foreign countries.

## 2024-05-28 NOTE — Progress Notes (Signed)
 Contacted via MyChart  Good morning Geneva, your sodium level is trending up now towards normal but still is low.  Please continue current plan of care, cutting back on alcohol as much as possible to prevent sodium being too low, which in turn could lead to lots of issues, including your fatigue getting worse.  Phosphorous level is normal.  We will recheck next visit.  Any questions? Keep being stellar!!  Thank you for allowing me to participate in your care.  I appreciate you. Kindest regards, Yamir Carignan

## 2024-05-28 NOTE — Telephone Encounter (Signed)
 Received DPX forms. Sent to Media tab since patient has Medicaid

## 2024-05-31 ENCOUNTER — Other Ambulatory Visit: Payer: Self-pay | Admitting: Urology

## 2024-06-07 ENCOUNTER — Ambulatory Visit (INDEPENDENT_AMBULATORY_CARE_PROVIDER_SITE_OTHER): Payer: MEDICAID | Admitting: Urology

## 2024-06-07 VITALS — BP 152/87 | HR 96 | Ht 70.0 in | Wt 194.0 lb

## 2024-06-07 DIAGNOSIS — N401 Enlarged prostate with lower urinary tract symptoms: Secondary | ICD-10-CM

## 2024-06-07 LAB — URINALYSIS, COMPLETE
Bilirubin, UA: NEGATIVE
Glucose, UA: NEGATIVE
Leukocytes,UA: NEGATIVE
Nitrite, UA: NEGATIVE
Protein,UA: NEGATIVE
RBC, UA: NEGATIVE
Specific Gravity, UA: 1.02 (ref 1.005–1.030)
Urobilinogen, Ur: 0.2 mg/dL (ref 0.2–1.0)
pH, UA: 6 (ref 5.0–7.5)

## 2024-06-07 LAB — MICROSCOPIC EXAMINATION
Epithelial Cells (non renal): NONE SEEN /HPF (ref 0–10)
Mucus, UA: NONE SEEN

## 2024-06-07 NOTE — Progress Notes (Signed)
    06/07/24  Chief Complaint  Patient presents with   Benign Prostatic Hypertrophy     HPI: Refer to my prior note 04/30/2024.  UA negative   Blood pressure (!) 152/87, pulse 96, height 5' 10 (1.778 m), weight 194 lb (88 kg). NED. A&Ox3.   No respiratory distress   Abd soft, NT, ND Normal phallus with bilateral descended testicles    Cystoscopy Procedure Note  Patient identification was confirmed, informed consent was obtained, and patient was prepped using Betadine solution.  Lidocaine  jelly was administered per urethral meatus.     Pre-Procedure: - Inspection reveals a normal caliber urethral meatus.  Procedure: The flexible cystoscope was introduced without difficulty - No urethral strictures/lesions are present. - Mild lateral lobe enlargement prostate  - Normal bladder neck - Bilateral ureteral orifices identified - Bladder mucosa  reveals no ulcers, tumors, or lesions - No bladder stones - No trabeculation  Retroflexion shows no abnormalities or intravesical median lobe   Post-Procedure: - Patient tolerated the procedure well   Prostate transrectal ultrasound sizing   Informed consent was obtained after discussing risks/benefits of the procedure.  A time out was performed to ensure correct patient identity.   Pre-Procedure: -Transrectal probe was placed without difficulty -Transrectal Ultrasound performed revealing a 19.7 gm prostate measuring 2.4 x 4.0 x 3.9 cm (length) -No significant hypoechoic or median lobe noted      Assessment/ Plan: Mild lateral lobe enlargement Small volume prostate Recommend urodynamics prior to any outlet procedure Today he states he is not interested in pursuing surgery unless his voiding symptoms got a lot worse.  He will continue tamsulosin , finasteride  and tadalafil  Schedule 1 year follow-up and will call earlier for worsening voiding symptoms   Glendia JAYSON Barba, MD

## 2024-06-07 NOTE — Telephone Encounter (Signed)
 Dayne Sherry RAMAN, RPH-CPP    05/28/24 10:47 AM Note Received DPX forms. Sent to Media tab since patient has Medicaid       Nothing further needed.

## 2024-06-14 NOTE — Telephone Encounter (Signed)
 ATC patient regarding Dupixent . Unable to reach  Could potentially send rx to local pharmacy but no guarantees that they will fill it (they may also triage to their own specialty pharmacy)  Sherry Pennant, PharmD, MPH, BCPS, CPP Clinical Pharmacist (Rheumatology and Pulmonology)

## 2024-06-16 MED ORDER — DUPIXENT 300 MG/2ML ~~LOC~~ SOAJ: 300.0000 mg | SUBCUTANEOUS | 1 refills | Status: DC

## 2024-06-16 MED ORDER — DUPIXENT 300 MG/2ML ~~LOC~~ SOAJ: 300.0000 mg | SUBCUTANEOUS | 1 refills | Status: AC

## 2024-06-16 NOTE — Telephone Encounter (Signed)
 Called patient in regards to Dupixent . Advised patient to utilize [facility] at [place] to pick up his prescription. Patient willing to fill there.  Provided patient with address to pharmacy. Patient already trained on how to use Dupixent  autoinjector pen. Advised patient that [facility] will call to set up shipment of Dupixent  to the pharmacy. Encouraged patient to call the pharmacy to ensure prescription was received before picking it up.   Deleta Colt PharmD Candidate [phone]  [facility]

## 2024-06-16 NOTE — Telephone Encounter (Signed)
 Received notice from [facility] that spec pharmacy will not be able to coordinate shipment to [facility]. Reached out to [facility] in [place], [place] where he usually fills his non-spec meds  Pharmacy rep states they may be able to fill rx. If placed on order today, likely will not arrive until Friday, 06/18/2024 for pickup.  Patient advised with update and recommended he call pharmacy on Friday if no update. Pharmacy also stated that if any issues filling med, they will directly call patient with resolution and/or options  Sherry Pennant, PharmD, MPH, BCPS, CPP Clinical Pharmacist (Rheumatology and Pulmonology)

## 2024-06-16 NOTE — Telephone Encounter (Signed)
 Rx sent to [facility] for onboarding. Patient would like to pick up [facility] but aware that [facility] will have to call to set up shipment and coordination  Sherry Pennant, PharmD, MPH, BCPS, CPP Clinical Pharmacist (Rheumatology and Pulmonology)

## 2024-06-16 NOTE — Addendum Note (Signed)
 Addended by: DAYNE SHERRY RAMAN on: 06/16/2024 03:59 PM   Modules accepted: Orders

## 2024-06-24 ENCOUNTER — Ambulatory Visit
Admission: RE | Admit: 2024-06-24 | Discharge: 2024-06-24 | Disposition: A | Discharge status: Home / Self Care | Payer: MEDICAID | Source: Ambulatory Visit | Attending: Pulmonary Disease | Admitting: Pulmonary Disease

## 2024-06-24 DIAGNOSIS — R0602 Shortness of breath: Secondary | ICD-10-CM | POA: Diagnosis present

## 2024-06-28 ENCOUNTER — Ambulatory Visit: Payer: Self-pay | Admitting: Student in an Organized Health Care Education/Training Program

## 2024-07-15 ENCOUNTER — Other Ambulatory Visit: Payer: Self-pay | Admitting: Urology

## 2024-07-21 ENCOUNTER — Encounter: Payer: Self-pay | Admitting: Cardiology

## 2024-07-21 ENCOUNTER — Ambulatory Visit: Payer: MEDICAID | Attending: Cardiology | Admitting: Cardiology

## 2024-07-21 VITALS — BP 140/80 | HR 71 | Ht 70.0 in | Wt 188.8 lb

## 2024-07-21 DIAGNOSIS — E871 Hypo-osmolality and hyponatremia: Secondary | ICD-10-CM | POA: Diagnosis present

## 2024-07-21 DIAGNOSIS — E782 Mixed hyperlipidemia: Secondary | ICD-10-CM | POA: Insufficient documentation

## 2024-07-21 DIAGNOSIS — R0602 Shortness of breath: Secondary | ICD-10-CM | POA: Insufficient documentation

## 2024-07-21 DIAGNOSIS — I6523 Occlusion and stenosis of bilateral carotid arteries: Secondary | ICD-10-CM | POA: Diagnosis present

## 2024-07-21 DIAGNOSIS — F10288 Alcohol dependence with other alcohol-induced disorder: Secondary | ICD-10-CM | POA: Diagnosis present

## 2024-07-21 DIAGNOSIS — I10 Essential (primary) hypertension: Secondary | ICD-10-CM | POA: Diagnosis not present

## 2024-07-21 DIAGNOSIS — F17210 Nicotine dependence, cigarettes, uncomplicated: Secondary | ICD-10-CM | POA: Insufficient documentation

## 2024-07-21 DIAGNOSIS — I739 Peripheral vascular disease, unspecified: Secondary | ICD-10-CM | POA: Insufficient documentation

## 2024-07-21 NOTE — Progress Notes (Signed)
 Cardiology Office Note   Date:  07/21/2024  ID:  Luke Carpenter., DOB 1960/12/21, MRN [id number] PCP: Valerio Melanie DASEN, NP  [facility] Providers Cardiologist:  Redell Cave, MD     History of Present Illness Luke Briggs. is a 63 y.o. male with a past medical history of aortic atherosclerosis, carotid stenosis followed by [facility], GERD, alcohol abuse, tobacco abuse, osteoarthritis, BPH, chronic pain syndrome, schizophrenia, mixed hyperlipidemia, who is here today for follow-up.   Prior echocardiogram completed 10/2019 showed normal systolic and diastolic function. Lexiscan  Myoview  completed 01/25/2019 showed no evidence of ischemia. Repeat Lexiscan  Myoview  done 07/2011 for showed low restudy with no evidence of ischemia. Repeat echocardiogram completed 07/2023 revealed an EF of 50-55%, no RWMA, G1 DD, and mild mitral regurgitation.   He was seen in clinic 09/23/2023 at that time he had made some changes to his medication.  He had been taken off olmesartan  HCTZ due to hypokalemia and was started on losartan .  He had had atopic dermatitis and was started on Dupixent  and was found to have blood pressures of over 200.  He was advised to go to the emergency department at that point he declined.  He had stopped taking the losartan  and started taking half of a dose of olmesartan  HCTZ 20/12.5 at bedtime and an improvement in his blood pressures at home.  After much discussion he was sent for follow-up labs to recheck his sodium level started on additional 10 mg of olmesartan  in the morning any continued on olmesartan  HCTZ 20/12.5 mg in the evening.  There was no further testing that was ordered and no other medication changes that were made.   He was last seen in clinic 12/29/2023.  Been doing well from a cardiac perspective.  Blood pressures were elevated he stated may have forgot to take his medications last evening.  He appeared to be intoxicated from the night before.  He was continued on  his current medication regimen without changes being made.  He returns to clinic today stating he occasionally has shortness of breath, fatigue, dizziness, and has been coughing.  He states he recently had labs done with his PCP.  States that he has been compliant with his current medication regimen.  Continues to consume large quantities of alcohol.  States that he is drinking an increased amount of water.  Denies any recent hospitalizations or visits to the emergency department.  ROS: 10 point review of systems has been reviewed and considered negative except ones were listed in the HPI  Studies Reviewed EKG Interpretation Date/Time:  Wednesday July 21 2024 08:45:26 EDT Ventricular Rate:  71 PR Interval:  142 QRS Duration:  78 QT Interval:  396 QTC Calculation: 430 R Axis:   3  Text Interpretation: Normal sinus rhythm Normal ECG When compared with ECG of 29-Dec-2023 08:17, No significant change was found Confirmed by Gerard Frederick ([id number]) on 07/21/2024 8:58:16 AM    TTE 08/12/23 1. Left ventricular ejection fraction, by estimation, is 50 to 55%. Left  ventricular ejection fraction by 3D volume is 51 %. The left ventricle has  low normal function. The left ventricle has no regional wall motion  abnormalities. Left ventricular  diastolic parameters are consistent with Grade I diastolic dysfunction  (impaired relaxation). The average left ventricular global longitudinal  strain is -11.0 %.   2. Right ventricular systolic function is normal. The right ventricular  size is normal. There is normal pulmonary artery systolic pressure. The  estimated right  ventricular systolic pressure is 21.0 mmHg.   3. The mitral valve is normal in structure. Mild mitral valve  regurgitation. No evidence of mitral stenosis.   4. The aortic valve is tricuspid. Aortic valve regurgitation is not  visualized. No aortic stenosis is present.   5. The inferior vena cava is normal in size with greater than 50%   respiratory variability, suggesting right atrial pressure of 3 mmHg.    Lexiscan  MPI 07/28/23   The study is normal. The study is low risk.   No ST deviation was noted.   LV perfusion is normal. There is no evidence of ischemia. There is no evidence of infarction.   Left ventricular function is normal. End diastolic cavity size is normal. End systolic cavity size is normal.   CT attenuation images showed minimal aortic and coronary calcifications.   Lexiscan  MPI 01/24/2021 Narrative & Impression  T wave inversion was noted during stress in the V4, V5, V6 and II leads. There was no ST segment deviation noted during stress. The study is normal. This is a low risk study. The left ventricular ejection fraction is normal (55-65%). CT attenuation images showed no significant aortic or coronary calcifications.    This result has not been signed. Information might be incomplete.    TTE 11/18/2019 1. Left ventricular ejection fraction, by visual estimation, is 60 to  65%. The left ventricle has normal function. There is no left ventricular  hypertrophy.   2. The left ventricle has no regional wall motion abnormalities.   3. Global right ventricle has normal systolic function.The right  ventricular size is normal. No increase in right ventricular wall  thickness.   4. Left atrial size was normal.   5. Right atrial size was normal.   6. The mitral valve is normal in structure. No evidence of mitral valve  regurgitation. No evidence of mitral stenosis.   7. The tricuspid valve is normal in structure.   8. The aortic valve was not well visualized. Aortic valve regurgitation  is not visualized. No evidence of aortic valve sclerosis or stenosis.   9. The pulmonic valve was not well visualized. Pulmonic valve  regurgitation is not visualized.  10. Normal pulmonary artery systolic pressure.  11. The inferior vena cava is normal in size with greater than 50%  respiratory variability, suggesting  right atrial pressure of 3 mmHg.   Risk Assessment/Calculations     Physical Exam VS:  BP (!) 140/80 (BP Location: Left Arm, Patient Position: Sitting, Cuff Size: Normal)   Pulse 71   Ht 5' 10 (1.778 m)   Wt 188 lb 12.8 oz (85.6 kg)   SpO2 98%   BMI 27.09 kg/m        Wt Readings from Last 3 Encounters:  07/21/24 188 lb 12.8 oz (85.6 kg)  06/07/24 194 lb (88 kg)  05/27/24 194 lb (88 kg)    GEN: Well nourished, well developed in no acute distress NECK: No JVD; No carotid bruits CARDIAC: RRR, no murmurs, rubs, gallops RESPIRATORY:  Clear to auscultation without rales, wheezing or rhonchi  ABDOMEN: Soft, non-tender, non-distended EXTREMITIES:  No edema; No deformity   ASSESSMENT AND PLAN Primary hypertension with a blood pressure of 140/80.  He has been continued on olmesartan /HCTZ 40/25 mg daily and is not sure if he has taking his medication today.  He has been encouraged to continue monitoring pressure 1 to 2 hours postmedication titration at home as well.  Chart review reveals elevation in blood pressure  noted at several different appointments.  Discussed the potential need for additional medications and he declined at this time stating he has upcoming labs with his PCP.  Chronic shortness of breath with chronic tobacco use.  Echocardiogram completed in 07/2023 revealed an LVEF of 50-55%, no RWMA.  States that his shortness of breath has not changed within the last 6 months.  He has continued on his inhalers.  EKG today reveals sinus rhythm with a rate of 71 with no acute ischemic changes noted.  No further testing needed at this time.  Chronic hyponatremia with most recent sodium level of 132.  He states he has labs and recommend with his PCP.  Advised that oftentimes the sodium issues are water issues and he should limit the amount of free water that he is drinking daily.  Also advised to getsecondary to his HCTZ.  Declines any medication changes.  Mixed hyperlipidemia with last  LDL of 102.  At encouraged to increase rosuvastatin  to 20 mg daily.  This continues to be managed by his PCP.  Peripheral arterial disease with bilateral carotid stenosis and continues to be followed by [facility].  He has continued on his statin therapy.    Continued tobacco and alcohol abuse where he has no desire to quit at this time but total cessation continues to be recommended.       Dispo: Patient to return to clinic to see MD/APP in 6 months or sooner if needed for further evaluation.  Signed, Mang Hazelrigg, NP

## 2024-07-21 NOTE — Patient Instructions (Signed)
 Medication Instructions:  Your physician recommends that you continue on your current medications as directed. Please refer to the Current Medication list given to you today.  *If you need a refill on your cardiac medications before your next appointment, please call your pharmacy*  Lab Work: No labs ordered today  If you have labs (blood work) drawn today and your tests are completely normal, you will receive your results only by: MyChart Message (if you have MyChart) OR A paper copy in the mail If you have any lab test that is abnormal or we need to change your treatment, we will call you to review the results.  Testing/Procedures: No test ordered today   Follow-Up: At [facility], you and your health needs are our priority.  As part of our continuing mission to provide you with exceptional heart care, our providers are all part of one team.  This team includes your primary Cardiologist (physician) and Advanced Practice Providers or APPs (Physician Assistants and Nurse Practitioners) who all work together to provide you with the care you need, when you need it.  Your next appointment:   6 month(s)  Provider:   You may see Constancia Delton, MD or one of the following Advanced Practice Providers on your designated Care Team:   Laneta Pintos, NP Gildardo Labrador, PA-C Varney Gentleman, PA-C Cadence [place], PA-C Ronald Cockayne, NP Morey Ar, NP

## 2024-07-21 NOTE — Progress Notes (Signed)
Patient declined avs

## 2024-08-01 NOTE — Patient Instructions (Signed)
 Be Involved in Caring For Your Health:  Taking Medications When medications are taken as directed, they can greatly improve your health. But if they are not taken as prescribed, they may not work. In some cases, not taking them correctly can be harmful. To help ensure your treatment remains effective and safe, understand your medications and how to take them. Bring your medications to each visit for review by your provider.  Your lab results, notes, and after visit summary will be available on My Chart. We strongly encourage you to use this feature. If lab results are abnormal the clinic will contact you with the appropriate steps. If the clinic does not contact you assume the results are satisfactory. You can always view your results on My Chart. If you have questions regarding your health or results, please contact the clinic during office hours. You can also ask questions on My Chart.  We at [facility] are grateful that you chose [place] to provide your care. We strive to provide evidence-based and compassionate care and are always looking for feedback. If you get a survey from the clinic please complete this so we can hear your opinions.  Heart-Healthy Eating Plan Many factors influence your heart health, including eating and exercise habits. Heart health is also called coronary health. Coronary risk increases with abnormal blood fat (lipid) levels. A heart-healthy eating plan includes limiting unhealthy fats, increasing healthy fats, limiting salt (sodium) intake, and making other diet and lifestyle changes. What is my plan? Your health care provider may recommend that: You limit your fat intake to _________% or less of your total calories each day. You limit your saturated fat intake to _________% or less of your total calories each day. You limit the amount of cholesterol in your diet to less than _________ mg per day. You limit the amount of sodium in your diet to less than _________  mg per day. What are tips for following this plan? Cooking Cook foods using methods other than frying. Baking, boiling, grilling, and broiling are all good options. Other ways to reduce fat include: Removing the skin from poultry. Removing all visible fats from meats. Steaming vegetables in water or broth. Meal planning  At meals, imagine dividing your plate into fourths: Fill one-half of your plate with vegetables and green salads. Fill one-fourth of your plate with whole grains. Fill one-fourth of your plate with lean protein foods. Eat 2-4 cups of vegetables per day. One cup of vegetables equals 1 cup (91 g) broccoli or cauliflower florets, 2 medium carrots, 1 large bell pepper, 1 large sweet potato, 1 large tomato, 1 medium white potato, 2 cups (150 g) raw leafy greens. Eat 1-2 cups of fruit per day. One cup of fruit equals 1 small apple, 1 large banana, 1 cup (237 g) mixed fruit, 1 large orange,  cup (82 g) dried fruit, 1 cup (240 mL) 100% fruit juice. Eat more foods that contain soluble fiber. Examples include apples, broccoli, carrots, beans, peas, and barley. Aim to get 25-30 g of fiber per day. Increase your consumption of legumes, nuts, and seeds to 4-5 servings per week. One serving of dried beans or legumes equals  cup (90 g) cooked, 1 serving of nuts is  oz (12 almonds, 24 pistachios, or 7 walnut halves), and 1 serving of seeds equals  oz (8 g). Fats Choose healthy fats more often. Choose monounsaturated and polyunsaturated fats, such as olive and canola oils, avocado oil, flaxseeds, walnuts, almonds, and seeds. Eat  more omega-3 fats. Choose salmon, mackerel, sardines, tuna, flaxseed oil, and ground flaxseeds. Aim to eat fish at least 2 times each week. Check food labels carefully to identify foods with trans fats or high amounts of saturated fat. Limit saturated fats. These are found in animal products, such as meats, butter, and cream. Plant sources of saturated fats  include palm oil, palm kernel oil, and coconut oil. Avoid foods with partially hydrogenated oils in them. These contain trans fats. Examples are stick margarine, some tub margarines, cookies, crackers, and other baked goods. Avoid fried foods. General information Eat more home-cooked food and less restaurant, buffet, and fast food. Limit or avoid alcohol. Limit foods that are high in added sugar and simple starches such as foods made using white refined flour (white breads, pastries, sweets). Lose weight if you are overweight. Losing just 5-10% of your body weight can help your overall health and prevent diseases such as diabetes and heart disease. Monitor your sodium intake, especially if you have high blood pressure. Talk with your health care provider about your sodium intake. Try to incorporate more vegetarian meals weekly. What foods should I eat? Fruits All fresh, canned (in natural juice), or frozen fruits. Vegetables Fresh or frozen vegetables (raw, steamed, roasted, or grilled). Green salads. Grains Most grains. Choose whole wheat and whole grains most of the time. Rice and pasta, including brown rice and pastas made with whole wheat. Meats and other proteins Lean, well-trimmed beef, veal, pork, and lamb. Chicken and [place] without skin. All fish and shellfish. Wild duck, rabbit, pheasant, and venison. Egg whites or low-cholesterol egg substitutes. Dried beans, peas, lentils, and tofu. Seeds and most nuts. Dairy Low-fat or nonfat cheeses, including ricotta and mozzarella. Skim or 1% milk (liquid, powdered, or evaporated). Buttermilk made with low-fat milk. Nonfat or low-fat yogurt. Fats and oils Non-hydrogenated (trans-free) margarines. Vegetable oils, including soybean, sesame, sunflower, olive, avocado, peanut, safflower, corn, canola, and cottonseed. Salad dressings or mayonnaise made with a vegetable oil. Beverages Water (mineral or sparkling). Coffee and tea. Unsweetened ice  tea. Diet beverages. Sweets and desserts Sherbet, gelatin, and fruit ice. Small amounts of dark chocolate. Limit all sweets and desserts. Seasonings and condiments All seasonings and condiments. The items listed above may not be a complete list of foods and beverages you can eat. Contact a dietitian for more options. What foods should I avoid? Fruits Canned fruit in heavy syrup. Fruit in cream or butter sauce. Fried fruit. Limit coconut. Vegetables Vegetables cooked in cheese, cream, or butter sauce. Fried vegetables. Grains Breads made with saturated or trans fats, oils, or whole milk. Croissants. Sweet rolls. Donuts. High-fat crackers, such as cheese crackers and chips. Meats and other proteins Fatty meats, such as hot dogs, ribs, sausage, bacon, rib-eye roast or steak. High-fat deli meats, such as salami and bologna. Caviar. Domestic duck and goose. Organ meats, such as liver. Dairy Cream, sour cream, cream cheese, and creamed cottage cheese. Whole-milk cheeses. Whole or 2% milk (liquid, evaporated, or condensed). Whole buttermilk. Cream sauce or high-fat cheese sauce. Whole-milk yogurt. Fats and oils Meat fat, or shortening. Cocoa butter, hydrogenated oils, palm oil, coconut oil, palm kernel oil. Solid fats and shortenings, including bacon fat, salt pork, lard, and butter. Nondairy cream substitutes. Salad dressings with cheese or sour cream. Beverages Regular sodas and any drinks with added sugar. Sweets and desserts Frosting. Pudding. Cookies. Cakes. Pies. Milk chocolate or white chocolate. Buttered syrups. Full-fat ice cream or ice cream drinks. The items listed above may  not be a complete list of foods and beverages to avoid. Contact a dietitian for more information. Summary Heart-healthy meal planning includes limiting unhealthy fats, increasing healthy fats, limiting salt (sodium) intake and making other diet and lifestyle changes. Lose weight if you are overweight. Losing just  5-10% of your body weight can help your overall health and prevent diseases such as diabetes and heart disease. Focus on eating a balance of foods, including fruits and vegetables, low-fat or nonfat dairy, lean protein, nuts and legumes, whole grains, and heart-healthy oils and fats. This information is not intended to replace advice given to you by your health care provider. Make sure you discuss any questions you have with your health care provider. Document Revised: 12/10/2021 Document Reviewed: 12/10/2021 Elsevier Patient Education  2024 [employer].

## 2024-08-06 ENCOUNTER — Encounter: Payer: Self-pay | Admitting: Nurse Practitioner

## 2024-08-06 ENCOUNTER — Telehealth: Payer: Self-pay | Admitting: Nurse Practitioner

## 2024-08-06 ENCOUNTER — Ambulatory Visit: Payer: MEDICAID | Admitting: Nurse Practitioner

## 2024-08-06 VITALS — BP 142/90 | HR 60 | Wt 189.4 lb

## 2024-08-06 DIAGNOSIS — I10 Essential (primary) hypertension: Secondary | ICD-10-CM

## 2024-08-06 DIAGNOSIS — Z8673 Personal history of transient ischemic attack (TIA), and cerebral infarction without residual deficits: Secondary | ICD-10-CM

## 2024-08-06 DIAGNOSIS — E782 Mixed hyperlipidemia: Secondary | ICD-10-CM

## 2024-08-06 DIAGNOSIS — Z23 Encounter for immunization: Secondary | ICD-10-CM

## 2024-08-06 DIAGNOSIS — F10288 Alcohol dependence with other alcohol-induced disorder: Secondary | ICD-10-CM

## 2024-08-06 DIAGNOSIS — J438 Other emphysema: Secondary | ICD-10-CM | POA: Diagnosis not present

## 2024-08-06 DIAGNOSIS — N401 Enlarged prostate with lower urinary tract symptoms: Secondary | ICD-10-CM

## 2024-08-06 DIAGNOSIS — F4312 Post-traumatic stress disorder, chronic: Secondary | ICD-10-CM

## 2024-08-06 DIAGNOSIS — E559 Vitamin D deficiency, unspecified: Secondary | ICD-10-CM

## 2024-08-06 DIAGNOSIS — F17210 Nicotine dependence, cigarettes, uncomplicated: Secondary | ICD-10-CM

## 2024-08-06 DIAGNOSIS — F209 Schizophrenia, unspecified: Secondary | ICD-10-CM | POA: Diagnosis not present

## 2024-08-06 DIAGNOSIS — I700 Atherosclerosis of aorta: Secondary | ICD-10-CM

## 2024-08-06 DIAGNOSIS — I739 Peripheral vascular disease, unspecified: Secondary | ICD-10-CM | POA: Diagnosis not present

## 2024-08-06 DIAGNOSIS — G894 Chronic pain syndrome: Secondary | ICD-10-CM

## 2024-08-06 NOTE — Assessment & Plan Note (Signed)
 Ongoing and endorses at visits that he feels bad all the time.  Recommend cutting back -- concern for overall health with ongoing heavy use and suspect major reason for his overall feeling bad at baseline although he feels it makes him feel better.  Main trigger is his significant other also drinks.  Is aware of effect of heavy alcohol use on overall health, especially BP and overall health.   LABS: CBC and CMP.  Refuses AA or medication at this time. We discussed at length that if ongoing heavy alcohol use it could lead to poor outcomes in the future.

## 2024-08-06 NOTE — Telephone Encounter (Signed)
-----   Message from [name] T [name] sent at 08/06/2024  9:40 AM EDT ----- We missed his pneumonia and flu vaccines being given today, please call and see if he would like to do an outpatient nurse visit to obtain.

## 2024-08-06 NOTE — Assessment & Plan Note (Signed)
 Chronic, ongoing.  Recommend he continue Rosuvastatin  daily and will adjust as needed.  Lipid panel today.

## 2024-08-06 NOTE — Telephone Encounter (Signed)
 Called patient to see if he would like to come in and get the Flu and pneumonia shots that he didn't during the visit. He can get scheduled for a Nurse visit to receive these.

## 2024-08-06 NOTE — Assessment & Plan Note (Signed)
I have recommended complete cessation of tobacco use. I have discussed various options available for assistance with tobacco cessation including over the counter methods (Nicotine gum, patch and lozenges). We also discussed prescription options (Chantix, Nicotine Inhaler / Nasal Spray). The patient is not interested in pursuing any prescription tobacco cessation options at this time.  Continue yearly lung CA screening.

## 2024-08-06 NOTE — Assessment & Plan Note (Signed)
Chronic, ongoing.  Followed by vascular, recent notes reviewed.

## 2024-08-06 NOTE — Assessment & Plan Note (Signed)
 History of infarct, continue collaboration with neuro and ENT for ongoing tinnitus, headaches, and dizziness.  Recent MRI remains stable.  Highly recommend he return for neurology visits.

## 2024-08-06 NOTE — Assessment & Plan Note (Signed)
Chronic, ongoing.  No current medications.  Tolerating this and does not wish to start medication.  Could consider Seroquel in future if needed or return to Clonidine.  Denies SI/HI.

## 2024-08-06 NOTE — Assessment & Plan Note (Signed)
 Noted on past labs, check level today and recommend he take Vitamin D3 2000 units daily.

## 2024-08-06 NOTE — Assessment & Plan Note (Signed)
Chronic, noted on CT lung screening.  Continue statin therapy + recommend complete cessation of smoking.  Lipid panel today. 

## 2024-08-06 NOTE — Progress Notes (Signed)
 BP (!) 142/90 (BP Location: Left Arm, Patient Position: Sitting)   Pulse 60   Wt 189 lb 6.4 oz (85.9 kg)   SpO2 99%   BMI 27.18 kg/m    Subjective:    Patient ID: Luke Briggs., male    DOB: Jul 13, 1961, 63 y.o.   MRN: [id number]  HPI: Luke Briggs. is a 63 y.o. male  Chief Complaint  Patient presents with   Hypertension   COPD   Mood    Alcohol Problem   Hyperlipidemia   Follows with urology for BPH, last visit on 06/07/24 with cysto.  HYPERTENSION without Chronic Kidney Disease  Taking Olmesartan /HCTZ 40/25 MG and Rosuvastatin .  Saw cardiology last on 07/21/24. Echo with EF 50-55% and G1 DD on 08/12/23. History of CVA.  Saw vascular on 03/01/24, ultrasound with RICA 1-39% and LICA 40-59%.   Ongoing alcohol use nightly -- 8-12 [employer] [place] a day.  Takes occasional night off. Reports drinking helps his tinnitus.  Feels worse if he does not drink.   HISTORY: Anaphylaxis with Lisinopril , Carvedilol  N&V, Amlodipine  dizziness. Hypertension status: varies Satisfied with current treatment? yes Duration of hypertension: chronic BP monitoring frequency:  not checking BP range:  BP medication side effects:  no Medication compliance: good compliance Aspirin : no Recurrent headaches: no Visual changes: no Palpitations: no Dyspnea: ongoing issue, follows pulmonary Chest pain: no Lower extremity edema: no Dizzy/lightheaded: occasional The 10-year ASCVD risk score (Arnett DK, et al., 2019) is: 15.5%   Values used to calculate the score:     Age: 25 years     Clincally relevant sex: Male     Is Non-Hispanic African American: No     Diabetic: No     Tobacco smoker: Yes     Systolic Blood Pressure: 142 mmHg     Is BP treated: Yes     HDL Cholesterol: 64 mg/dL     Total Cholesterol: 181 mg/dL   COPD Using Breztri  BID and using Albuterol  BID.  Has history of low NA+ on labs, recent was stable. Continues to smoke 1 PPD.  Has smoked for >45 years. Last visit with  pulmonary on 05/27/24. Was given Azithromycin  to take MWF.  They ordered pulmonary rehab, but cannot perform as has no ride 3 days a week. Considering biologics.  Continues Vitamin D  and B12 supplements daily. COPD status: stable Satisfied with current treatment?: yes Oxygen  use: no Dyspnea frequency: yes Cough frequency: at baseline  Rescue inhaler frequency: every other day Limitation of activity: no Productive cough: none Last Spirometry: with pulmonary Pneumovax: Up to Date Influenza: Up to Date   SCHIZOPHRENIA No current medications. Mood status: stable Symptom severity: moderate  Psychotherapy/counseling: no none Depressed mood: sometimes Anxious mood: yes Anhedonia: no Significant weight loss or gain: no Insomnia: yes hard to fall asleep Fatigue: yes Feelings of worthlessness or guilt: yes Impaired concentration/indecisiveness: no Suicidal ideations: no Hopelessness: yes Crying spells: yes    08/06/2024    8:59 AM 05/04/2024    8:00 AM 01/30/2024    8:37 AM 12/24/2023    9:03 AM 08/14/2023    8:27 AM  Depression screen PHQ 2/9  Decreased Interest 3 2 2 3 3   Down, Depressed, Hopeless 2 2 3 2 3   PHQ - 2 Score 5 4 5 5 6   Altered sleeping 3 2 2 2 3   Tired, decreased energy 3 3 3 3 3   Change in appetite 1 1 2 1 3   Feeling bad or failure  about yourself  1 2 3 2 1   Trouble concentrating 2 2 2 1 3   Moving slowly or fidgety/restless 2 2 0 0 2  Suicidal thoughts 0 0 2 0 0  PHQ-9 Score 17 16 19 14 21   Difficult doing work/chores  Very difficult Very difficult Very difficult Extremely dIfficult       08/06/2024    8:58 AM 05/04/2024    8:01 AM 01/30/2024    8:37 AM 12/24/2023    9:03 AM  GAD 7 : Generalized Anxiety Score  Nervous, Anxious, on Edge 2 2 2 1   Control/stop worrying 1 2 1 2   Worry too much - different things 1 2 2 2   Trouble relaxing 2 2 2 3   Restless 1 1 1  0  Easily annoyed or irritable 3 2 2 1   Afraid - awful might happen 3 2 3 2   Total GAD 7 Score 13 13  13 11   Anxiety Difficulty  Very difficult Very difficult Somewhat difficult   Relevant past medical, surgical, family and social history reviewed and updated as indicated. Interim medical history since our last visit reviewed. Allergies and medications reviewed and updated.  Review of Systems  Constitutional:  Positive for fatigue. Negative for activity change, diaphoresis and fever.  Respiratory:  Positive for cough (chronic) and shortness of breath (at baseline, no worse). Negative for chest tightness and wheezing.   Cardiovascular:  Negative for chest pain, palpitations and leg swelling.  Gastrointestinal: Negative.   Musculoskeletal: Negative.   Skin: Negative.   Neurological: Negative.   Psychiatric/Behavioral: Negative.     Per HPI unless specifically indicated above     Objective:    BP (!) 142/90 (BP Location: Left Arm, Patient Position: Sitting)   Pulse 60   Wt 189 lb 6.4 oz (85.9 kg)   SpO2 99%   BMI 27.18 kg/m   Wt Readings from Last 3 Encounters:  08/06/24 189 lb 6.4 oz (85.9 kg)  07/21/24 188 lb 12.8 oz (85.6 kg)  06/07/24 194 lb (88 kg)    Physical Exam Vitals and nursing note reviewed.  Constitutional:      General: He is awake. He is not in acute distress.    Appearance: He is well-developed and well-groomed. He is not ill-appearing or toxic-appearing.  HENT:     Head: Normocephalic.     Right Ear: Hearing and external ear normal.     Left Ear: Hearing and external ear normal.  Eyes:     General: Lids are normal. No visual field deficit.    Extraocular Movements: Extraocular movements intact.     Conjunctiva/sclera: Conjunctivae normal.  Neck:     Thyroid: No thyromegaly.     Vascular: No carotid bruit.  Cardiovascular:     Rate and Rhythm: Normal rate and regular rhythm.     Heart sounds: Normal heart sounds. No murmur heard.    No gallop.  Pulmonary:     Effort: Pulmonary effort is normal. No accessory muscle usage or respiratory distress.      Breath sounds: Wheezing present. No decreased breath sounds or rales.     Comments: Expiratory wheezes noted throughout, per baseline. Abdominal:     General: Bowel sounds are normal. There is no distension.     Palpations: Abdomen is soft.     Tenderness: There is no abdominal tenderness.  Musculoskeletal:     Cervical back: Full passive range of motion without pain.     Right lower leg: No edema.  Left lower leg: No edema.  Lymphadenopathy:     Cervical: No cervical adenopathy.  Skin:    General: Skin is warm.     Capillary Refill: Capillary refill takes less than 2 seconds.     Findings: No rash.  Neurological:     Mental Status: He is alert and oriented to person, place, and time.     Cranial Nerves: No facial asymmetry.     Sensory: No sensory deficit.     Motor: Tremor (both hands, baseline) present. No weakness.     Coordination: Coordination is intact.     Gait: Gait is intact.     Deep Tendon Reflexes: Reflexes are normal and symmetric.     Reflex Scores:      Brachioradialis reflexes are 2+ on the right side and 2+ on the left side.      Patellar reflexes are 2+ on the right side and 2+ on the left side. Psychiatric:        Attention and Perception: Attention normal.        Mood and Affect: Mood normal.        Speech: Speech normal.        Behavior: Behavior is cooperative.        Thought Content: Thought content normal.    Results for orders placed or performed in visit on 06/07/24  Microscopic Examination   Collection Time: 06/07/24  8:35 AM   Urine  Result Value Ref Range   WBC, UA 0-5 0 - 5 /hpf   RBC, Urine 0-2 0 - 2 /hpf   Epithelial Cells (non renal) None seen 0 - 10 /hpf   Mucus, UA None seen Not Estab.  Urinalysis, Complete   Collection Time: 06/07/24  8:35 AM  Result Value Ref Range   Specific Gravity, UA 1.020 1.005 - 1.030   pH, UA 6.0 5.0 - 7.5   Color, UA Yellow Yellow   Appearance Ur Clear Clear   Leukocytes,UA Negative Negative    Protein,UA Negative Negative/Trace   Glucose, UA Negative Negative   Ketones, UA 1+ (A) Negative   RBC, UA Negative Negative   Bilirubin, UA Negative Negative   Urobilinogen, Ur 0.2 0.2 - 1.0 mg/dL   Nitrite, UA Negative Negative   Microscopic Examination Comment    Microscopic Examination See below:       Assessment & Plan:   Problem List Items Addressed This Visit       Cardiovascular and Mediastinum   PAD (peripheral artery disease) (HCC)   Chronic, ongoing.  Followed by vascular, recent notes reviewed.      Essential hypertension   Chronic, ongoing.  BP slightly above goal, does not always take medication before visits. Continue to recommend reduction to cessation of alcohol use, however he continues to drink heavily and refuses AA or medications.  Recommend cutting back on alcohol and smoking -- work towards complete cessation.  Recommend he monitor BP at least a few mornings a week at home and document.  DASH diet at home.  Labs today: CBC,CMP.  Could consider Hydralazine  in future or change to Valsartan from Olmesartan  -- discussed with him.  See what labs show and discuss with cardiology.       Relevant Orders   Comprehensive metabolic panel with GFR   TSH   Aortic atherosclerosis (HCC)   Chronic, noted on CT lung screening.  Continue statin therapy + recommend complete cessation of smoking.  Lipid panel today.  Respiratory   Paraseptal emphysema (HCC) - Primary   Chronic, ongoing. Continue to collaborate with pulmonary and medication regimen as ordered by them.  Also discussed that alcohol can effect the abx, to cut back on use.  Continue Breztri  BID and Albuterol  as needed at this time.  Continue yearly lung screening.  Recommend complete cessation of smoking.       Relevant Orders   CBC with Differential/Platelet     Genitourinary   BPH (benign prostatic hyperplasia)   Chronic, ongoing.  Is taking Flomax  daily.  Continue to collaborate with urology. PSA  today on labs.      Relevant Orders   PSA     Other   Vitamin D  deficiency   Noted on past labs, check level today and recommend he take Vitamin D3 2000 units daily.      Relevant Orders   VITAMIN D  25 Hydroxy (Vit-D Deficiency, Fractures)   Schizophrenia (HCC)   Chronic, ongoing.  No current medications.  Tolerating this and does not wish to start medication.  Could consider Seroquel in future if needed or return to Clonidine .  Denies SI/HI.      Nicotine  dependence   I have recommended complete cessation of tobacco use. I have discussed various options available for assistance with tobacco cessation including over the counter methods (Nicotine  gum, patch and lozenges). We also discussed prescription options (Chantix, Nicotine  Inhaler / Nasal Spray). The patient is not interested in pursuing any prescription tobacco cessation options at this time.  Continue yearly lung CA screening.       Mixed hyperlipidemia   Chronic, ongoing.  Recommend he continue Rosuvastatin  daily and will adjust as needed.  Lipid panel today.       Relevant Orders   Lipid Panel w/o Chol/HDL Ratio   History of lacunar cerebrovascular accident   History of infarct, continue collaboration with neuro and ENT for ongoing tinnitus, headaches, and dizziness.  Recent MRI remains stable.  Highly recommend he return for neurology visits.      Chronic post-traumatic stress disorder (PTSD)   Refer to schizophrenia plan of care.      Alcohol dependence (HCC)   Ongoing and endorses at visits that he feels bad all the time.  Recommend cutting back -- concern for overall health with ongoing heavy use and suspect major reason for his overall feeling bad at baseline although he feels it makes him feel better.  Main trigger is his significant other also drinks.  Is aware of effect of heavy alcohol use on overall health, especially BP and overall health.   LABS: CBC and CMP.  Refuses AA or medication at this time. We  discussed at length that if ongoing heavy alcohol use it could lead to poor outcomes in the future.      Relevant Orders   Comprehensive metabolic panel with GFR   Magnesium    Folate   Vitamin B12      Follow up plan: Return in about 4 months (around 12/06/2024) for HTN/HLD, COPD, SCHIZOPHRENIA.

## 2024-08-06 NOTE — Assessment & Plan Note (Signed)
Refer to schizophrenia plan of care. 

## 2024-08-06 NOTE — Assessment & Plan Note (Signed)
 Chronic, ongoing. Continue to collaborate with pulmonary and medication regimen as ordered by them.  Also discussed that alcohol can effect the abx, to cut back on use.  Continue Breztri  BID and Albuterol  as needed at this time.  Continue yearly lung screening.  Recommend complete cessation of smoking.

## 2024-08-06 NOTE — Assessment & Plan Note (Signed)
 Chronic, ongoing.  BP slightly above goal, does not always take medication before visits. Continue to recommend reduction to cessation of alcohol use, however he continues to drink heavily and refuses AA or medications.  Recommend cutting back on alcohol and smoking -- work towards complete cessation.  Recommend he monitor BP at least a few mornings a week at home and document.  DASH diet at home.  Labs today: CBC,CMP.  Could consider Hydralazine  in future or change to Valsartan from Olmesartan  -- discussed with him.  See what labs show and discuss with cardiology.

## 2024-08-06 NOTE — Assessment & Plan Note (Signed)
 Chronic, ongoing.  Is taking Flomax  daily.  Continue to collaborate with urology. PSA today on labs.

## 2024-08-07 ENCOUNTER — Ambulatory Visit: Payer: Self-pay | Admitting: Nurse Practitioner

## 2024-08-07 LAB — COMPREHENSIVE METABOLIC PANEL WITH GFR
ALT: 23 IU/L (ref 0–44)
AST: 29 IU/L (ref 0–40)
Albumin: 4.2 g/dL (ref 3.9–4.9)
Alkaline Phosphatase: 84 IU/L (ref 47–123)
BUN/Creatinine Ratio: 10 (ref 10–24)
BUN: 10 mg/dL (ref 8–27)
Bilirubin Total: 1 mg/dL (ref 0.0–1.2)
CO2: 22 mmol/L (ref 20–29)
Calcium: 9.5 mg/dL (ref 8.6–10.2)
Chloride: 96 mmol/L (ref 96–106)
Creatinine, Ser: 0.96 mg/dL (ref 0.76–1.27)
Globulin, Total: 2.6 g/dL (ref 1.5–4.5)
Glucose: 91 mg/dL (ref 70–99)
Potassium: 4.4 mmol/L (ref 3.5–5.2)
Sodium: 133 mmol/L — ABNORMAL LOW (ref 134–144)
Total Protein: 6.8 g/dL (ref 6.0–8.5)
eGFR: 90 mL/min/1.73 (ref >59)

## 2024-08-07 LAB — CBC WITH DIFFERENTIAL/PLATELET
Basophils Absolute: 0 x10E3/uL (ref 0.0–0.2)
Basos: 1 %
EOS (ABSOLUTE): 0.1 x10E3/uL (ref 0.0–0.4)
Eos: 1 %
Hematocrit: 44.3 % (ref 37.5–51.0)
Hemoglobin: 14.7 g/dL (ref 13.0–17.7)
Immature Grans (Abs): 0 x10E3/uL (ref 0.0–0.1)
Immature Granulocytes: 0 %
Lymphocytes Absolute: 2.1 x10E3/uL (ref 0.7–3.1)
Lymphs: 23 %
MCH: 30.8 pg (ref 26.6–33.0)
MCHC: 33.2 g/dL (ref 31.5–35.7)
MCV: 93 fL (ref 79–97)
Monocytes Absolute: 0.8 x10E3/uL (ref 0.1–0.9)
Monocytes: 9 %
Neutrophils Absolute: 5.9 x10E3/uL (ref 1.4–7.0)
Neutrophils: 66 %
Platelets: 329 x10E3/uL (ref 150–450)
RBC: 4.77 x10E6/uL (ref 4.14–5.80)
RDW: 12.3 % (ref 11.6–15.4)
WBC: 8.8 x10E3/uL (ref 3.4–10.8)

## 2024-08-07 LAB — LIPID PANEL W/O CHOL/HDL RATIO
Cholesterol, Total: 142 mg/dL (ref 100–199)
HDL: 64 mg/dL (ref >39)
LDL Chol Calc (NIH): 64 mg/dL (ref 0–99)
Triglycerides: 71 mg/dL (ref 0–149)
VLDL Cholesterol Cal: 14 mg/dL (ref 5–40)

## 2024-08-07 LAB — FOLATE: Folate: 9.9 ng/mL (ref >3.0)

## 2024-08-07 LAB — VITAMIN D 25 HYDROXY (VIT D DEFICIENCY, FRACTURES): Vit D, 25-Hydroxy: 33.5 ng/mL (ref 30.0–100.0)

## 2024-08-07 LAB — TSH: TSH: 0.847 u[IU]/mL (ref 0.450–4.500)

## 2024-08-07 LAB — VITAMIN B12: Vitamin B-12: 624 pg/mL (ref 232–1245)

## 2024-08-07 LAB — PSA: Prostate Specific Ag, Serum: 0.3 ng/mL (ref 0.0–4.0)

## 2024-08-07 LAB — MAGNESIUM: Magnesium: 1.8 mg/dL (ref 1.6–2.3)

## 2024-08-07 NOTE — Progress Notes (Signed)
 Contacted via MyChart  Good evening Bexton, your labs have returned and overall remain stable. Sodium remains a little low, please ensure to cut back on alcohol intake some. Lipid panel looks a lot better this check, continue all medications.  No changes needed.  Any questions? Keep being amazing!!  Thank you for allowing me to participate in your care.  I appreciate you. Kindest regards, Bucky Grigg

## 2024-08-09 NOTE — Telephone Encounter (Signed)
 Copied from CRM #[id number]. Topic: General - Other >> Aug 06, 2024  3:27 PM Luke Briggs wrote: Reason for CRM: returning call, does not want to schedule any shots right now, just got flu shot today.

## 2024-09-20 ENCOUNTER — Telehealth: Payer: Self-pay | Admitting: Nurse Practitioner

## 2024-09-20 ENCOUNTER — Telehealth: Payer: Self-pay | Admitting: Pharmacy Technician

## 2024-09-20 ENCOUNTER — Other Ambulatory Visit (HOSPITAL_COMMUNITY): Payer: Self-pay

## 2024-09-20 NOTE — Telephone Encounter (Signed)
 Pharmacy Patient Advocate Encounter   Received notification from Onbase that prior authorization for Breztri  Aerosphere 160-9-4.8MCG/ACT aerosol is required/requested.   Insurance verification completed.   The patient is insured through [employer].   Per test claim:  DULERA, NAME BRAND SYMBICORT, OR ADVAIR HFA is preferred by the insurance.  If suggested medication is appropriate, Please send in a new RX and discontinue this one. If not, please advise as to why it's not appropriate so that we may request a Prior Authorization. Please note, some preferred medications may still require a PA.  If the suggested medications have not been trialed and there are no contraindications to their use, the PA will not be submitted, as it will not be approved.

## 2024-09-20 NOTE — Telephone Encounter (Signed)
 Copied from CRM [id number]. Topic: Clinical - Medication Prior Auth >> Sep 20, 2024 10:22 AM Victoria A wrote: Reason for CRM: Patient said Pharmacy told him he will need a Prior Authorization for budesonide-glycopyrrolate -formoterol  (BREZTRI  AEROSPHERE) 160-9-4.8 MCG/ACT AERO inhaler- Patient wants medication sent to [facility].

## 2024-09-27 ENCOUNTER — Other Ambulatory Visit: Payer: Self-pay

## 2024-09-27 ENCOUNTER — Encounter: Payer: Self-pay | Admitting: Pulmonary Disease

## 2024-09-27 ENCOUNTER — Telehealth: Payer: Self-pay

## 2024-09-27 ENCOUNTER — Ambulatory Visit: Payer: MEDICAID | Admitting: Pulmonary Disease

## 2024-09-27 VITALS — BP 138/86 | HR 85 | Temp 98.5°F | Ht 70.0 in | Wt 188.8 lb

## 2024-09-27 DIAGNOSIS — F17210 Nicotine dependence, cigarettes, uncomplicated: Secondary | ICD-10-CM

## 2024-09-27 DIAGNOSIS — J4489 Other specified chronic obstructive pulmonary disease: Secondary | ICD-10-CM | POA: Diagnosis not present

## 2024-09-27 DIAGNOSIS — Z122 Encounter for screening for malignant neoplasm of respiratory organs: Secondary | ICD-10-CM

## 2024-09-27 DIAGNOSIS — Z87891 Personal history of nicotine dependence: Secondary | ICD-10-CM

## 2024-09-27 MED ORDER — BREZTRI AEROSPHERE 160-9-4.8 MCG/ACT IN AERO: 2.0000 | INHALATION_SPRAY | Freq: Two times a day (BID) | RESPIRATORY_TRACT | 0 refills | Status: AC

## 2024-09-27 MED ORDER — BREZTRI AEROSPHERE 160-9-4.8 MCG/ACT IN AERO: 2.0000 | INHALATION_SPRAY | Freq: Two times a day (BID) | RESPIRATORY_TRACT | 6 refills | Status: AC

## 2024-09-27 NOTE — Telephone Encounter (Signed)
*  Pulm  Pharmacy Patient Advocate Encounter   Received notification from Fax that prior authorization for Breztri  Aerosphere 160-9-4.8MCG/ACT aerosol  is required/requested.   Insurance verification completed.   The patient is insured through [employer].   Per test claim: PA required; PA started via CoverMyMeds. KEY [id number] . Waiting for clinical questions to populate.

## 2024-09-27 NOTE — Addendum Note (Signed)
 Addended by: VICCI EVALENE DEL on: 09/27/2024 10:09 AM   Modules accepted: Orders

## 2024-09-27 NOTE — Telephone Encounter (Signed)
 What inhalers has the patient tried and failed?

## 2024-09-27 NOTE — Progress Notes (Signed)
 Synopsis: Referred in  by Valerio Melanie DASEN, NP   Subjective:   PATIENT ID: Luke Briggs. GENDER: male DOB: [birth year], MRN: [id number]  Chief Complaint  Patient presents with   COPD    Increased SOB. Occasional wheezing. Cough, dry. Breztri - has been out for a week. Does not really help. Albuterol - 2-3 times daily.  Dupixent - did not get  Azithromycin - taking three time a week    HPI Case of a 63 year old male patient with a past medical history of heavy tobacco use, alcohol use disorder and COPD presenting today to the pulmonary clinic for ongoing worsening shortness of breath for the past 6 months.   He reports that he has been compliant with Breztri  2 puffs twice a day however his breathing has been getting gradually worse. To the point now where he notices it with basic ADL. Associated with cough and clear jelly like sputum production.   He does also report loud snoring and waking up gasping for air couple of times a night.   He had eczema in the past and was on dupixent .   CTA chest obtained by his PCP 07/2023 did not show any PE, but showed mild emphysema and signs of chronic bronchitis.   Echocardiogram 07/2023 with normal LVEF wo RWMA. Grade I Diastolic dysfunction.   Unfortunately he continues to smoke 2 packs per day and has been smoking for 49 years. He drinks 12 beers a night.   Strong family history of emphysema most notable in his father who was a heavy smoker.    OV 05/27/2024 - Luke Briggs is here to follow up regarding his breathing problem. He could not proceed with PFTS as he is very claustrophobic. Spirometry with normal FVC, FEV-1 at LLN, FEV-1/FVC ratio at LLN. PEFR is reduced. Started on Breztri  and azithromycin  suppressive therapy (01/2024) during the last visit with only minimal response. He does seem to have post nasal drip and I will start him on Flonase . Further more will obtain a CT chest to assess for parenchymal lung disease. Finally, I am  inclined on starting him on dupixent  given history of eczema and typ2 inflammation.   OV 09/27/2024 - Luke Briggs is here to follow up on multiple issues. He has clinical COPD and I am suspecting some asthma overlap. Unable to tdo PFTs due to clautrophobia. He ran out of Breztri  for the past week and has started to feel some shortness of breath and wheezing. His eczema has flared. Previously on Dupixent . Hasn't been using for the last 6 months. Discussed multiple options including Ohtuvayre . Agreed to restart Dupixent  and assess in 4 months.   ROS All systems were reviewed and are negative except for the above.  Objective:   Vitals:   09/27/24 0941  BP: 138/86  Pulse: 85  Temp: 98.5 F (36.9 C)  SpO2: 98%  Weight: 188 lb 12.8 oz (85.6 kg)  Height: 5' 10 (1.778 m)   98% on RA BMI Readings from Last 3 Encounters:  09/27/24 27.09 kg/m  08/06/24 27.18 kg/m  07/21/24 27.09 kg/m   Wt Readings from Last 3 Encounters:  09/27/24 188 lb 12.8 oz (85.6 kg)  08/06/24 189 lb 6.4 oz (85.9 kg)  07/21/24 188 lb 12.8 oz (85.6 kg)    Physical Exam GEN: NAD HEENT: Supple Neck, Reactive Pupils, EOMI  CVS: Normal S1, Normal S2, RRR, No murmurs or ES appreciated  Lungs: Poor air movement with diffuse exiratory wheezing.  Abdomen: Soft, non tender, non  distended, + BS  Extremities: Warm and well perfused, No edema   Labs and imaging were reviewed.   Ancillary Information   CBC    Component Value Date/Time   WBC 8.8 08/06/2024 0900   WBC 10.4 08/14/2023 0913   RBC 4.77 08/06/2024 0900   RBC 5.19 08/14/2023 0913   HGB 14.7 08/06/2024 0900   HCT 44.3 08/06/2024 0900   PLT 329 08/06/2024 0900   MCV 93 08/06/2024 0900   MCH 30.8 08/06/2024 0900   MCH 31.0 08/14/2023 0913   MCHC 33.2 08/06/2024 0900   MCHC 35.0 08/14/2023 0913   RDW 12.3 08/06/2024 0900   LYMPHSABS 2.1 08/06/2024 0900   MONOABS 1.0 08/14/2023 0913   EOSABS 0.1 08/06/2024 0900   BASOSABS 0.0 08/06/2024 0900        Latest Ref Rng & Units 05/27/2024    9:44 AM  PFT Results  FVC-Pre L 4.12   FVC-Predicted Pre % 86   Pre FEV1/FVC % % 70   FEV1-Pre L 2.89   FEV1-Predicted Pre % 80      Assessment & Plan:  Case of a 63 year old male patient with a past medical history of heavy tobacco use, alcohol use disorder and COPD presenting today to the pulmonary clinic for ongoing worsening shortness of breath for the past 6 months.   #COPD Stage I gp B with possible asthma overlap  #Chronic bronchitis #Allergic rhinitis   #Eczema Spiro w/ FEV-1 2.9L 80% of predicted.   []  c/wwith Breztri  2 puffs BID  []  c/w azithromycin  1 tab MWF for COPD associated chronic bronchitis.   []  re start Dupixent  placed.  []  Flonase  1 spray each nostril daily.    #Mild OSA RDI 11 Witness apneic episodes. Loud snoring and fatigue during the day.  Not interested in PAP therapy.   #Tobaco use disorder  Has 94 PPY. Already enrolled in LDCT next CT chest 07/2024. Not interested in smoking cessation but will try to cut down to 1 ppd.   Smoking/Tobacco Cessation Counseling Tage Feggins. is a current user of tobacco or nicotine  products. He is not ready to quit at this time. Counseling provided today addressed the risks of continued use and the benefits of cessation. Discussed tobacco/nicotine  use history, readiness to quit, and evidence-based treatment options including behavioral strategies, support resources, and pharmacologic therapies. Provided encouragement and educational materials on steps and resources to quit smoking. Patient questions were addressed, and follow-up recommended for continued support. Total time spent on counseling: 4 minutes.     RTC 3 months .  I personally spent a total of 40 minutes in the care of the patient today including preparing to see the patient, getting/reviewing separately obtained history, performing a medically appropriate exam/evaluation, counseling and educating, placing orders,  documenting clinical information in the EHR, independently interpreting results, and communicating results.   Darrin Barn, MD  Pulmonary Critical Care 09/27/2024 10:04 AM

## 2024-09-28 ENCOUNTER — Other Ambulatory Visit (HOSPITAL_COMMUNITY): Payer: Self-pay

## 2024-09-28 NOTE — Telephone Encounter (Signed)
 Received message from Kristina Johnson that patient did not get Dupixent . Seen by Dr. Malka on 09/27/24. Looking to start Dupixent .  Re-initiating benefits investigation to clarify where Dupixent  can be filled - appears he had Medicaid and could fill with [facility], but may have had concerns about paying over the phone. Possible that patient could pick up Rx from [facility], but will need to clarify if he has options to pay in person.   Will re-investigate to clarify insurance and preferred pharmacy for Dupixent .

## 2024-09-28 NOTE — Telephone Encounter (Signed)
 Your request has been approved Authorization Expiration11/09/2025

## 2024-09-28 NOTE — Telephone Encounter (Signed)
 Noted. Nothing further needed.

## 2024-09-28 NOTE — Telephone Encounter (Signed)
 Per test claim copay is $4 for 28 day supply. Confirmed with our specialty pharmacy that if pt would like to pick up the prescription he would pay at pickup and would not have to give credit card info over the phone. Attempted to call pt to confirm if he is ok with picking up at [facility] but he did not answer and the mailbox is full. Will attempt to contact later this week.

## 2024-09-28 NOTE — Telephone Encounter (Signed)
 Information added and submitted to plan, pending determination.

## 2024-10-05 NOTE — Telephone Encounter (Signed)
 Second attempt to contact pt at [phone]. Did not pick up and mailbox is full.

## 2024-10-07 ENCOUNTER — Other Ambulatory Visit: Payer: Self-pay | Admitting: Cardiology

## 2024-10-11 ENCOUNTER — Telehealth: Payer: Self-pay | Admitting: Cardiology

## 2024-10-11 MED ORDER — OLMESARTAN MEDOXOMIL-HCTZ 40-25 MG PO TABS: 1.0000 | ORAL_TABLET | Freq: Every day | ORAL | 3 refills | Status: AC

## 2024-10-11 NOTE — Telephone Encounter (Signed)
*  STAT* If patient is at the pharmacy, call can be transferred to refill team.   1. Which medications need to be refilled? (please list name of each medication and dose if known) olmesartan  40-25mg  1 tablet daily   2. Would you like to learn more about the convenience, safety, & potential cost savings by using the [facility]?    3. Are you open to using the [facility] (Type [facility].  ).   4. Which pharmacy/location (including street and city if local pharmacy) is medication to be sent to?[address] [place]   5. Do they need a 30 day or 90 day supply? 90days

## 2024-10-11 NOTE — Telephone Encounter (Signed)
 Pt's medication was sent to pt's pharmacy as requested. Confirmation received.

## 2024-10-25 ENCOUNTER — Other Ambulatory Visit: Payer: Self-pay | Admitting: Urology

## 2024-10-27 NOTE — Telephone Encounter (Signed)
 Closing encounter due to lack of f/u from patient. Has not returned calls or MyChart message (has read these). Routing to provider as RICK

## 2024-11-08 ENCOUNTER — Other Ambulatory Visit (HOSPITAL_COMMUNITY): Payer: Self-pay

## 2024-11-16 ENCOUNTER — Other Ambulatory Visit: Payer: Self-pay | Admitting: *Deleted

## 2024-11-16 MED ORDER — TAMSULOSIN HCL 0.4 MG PO CAPS: 0.4000 mg | ORAL_CAPSULE | Freq: Two times a day (BID) | ORAL | 3 refills | Status: AC

## 2024-11-24 ENCOUNTER — Other Ambulatory Visit: Payer: Self-pay | Admitting: Nurse Practitioner

## 2024-12-04 NOTE — Patient Instructions (Signed)
 Be Involved in Caring For Your Health:  Taking Medications When medications are taken as directed, they can greatly improve your health. But if they are not taken as prescribed, they may not work. In some cases, not taking them correctly can be harmful. To help ensure your treatment remains effective and safe, understand your medications and how to take them. Bring your medications to each visit for review by your provider.  Your lab results, notes, and after visit summary will be available on My Chart. We strongly encourage you to use this feature. If lab results are abnormal the clinic will contact you with the appropriate steps. If the clinic does not contact you assume the results are satisfactory. You can always view your results on My Chart. If you have questions regarding your health or results, please contact the clinic during office hours. You can also ask questions on My Chart.  We at [facility] are grateful that you chose [place] to provide your care. We strive to provide evidence-based and compassionate care and are always looking for feedback. If you get a survey from the clinic please complete this so we can hear your opinions.  Eating Plan for Chronic Obstructive Pulmonary Disease Chronic obstructive pulmonary disease (COPD) causes symptoms such as shortness of breath, coughing, and chest discomfort. These symptoms can make it difficult to eat enough to maintain a healthy weight. Generally, people with COPD should eat a diet that is high in calories, protein, and other nutrients to maintain body weight and to keep the lungs as healthy as possible. Depending on the medicines you take and other health conditions you may have, your health care provider may give you additional recommendations on what to eat or avoid. Talk with your health care provider about your goals for body weight, and work with a dietitian to develop an eating plan that is right for you. What are tips for  following this plan? Reading food labels  Avoid foods with more than 300 milligrams (mg) of salt (sodium) per serving. Choose foods that contain at least 4 grams (g) of fiber per serving. Try to eat 20-30 g of fiber each day. Choose foods that are high in calories and protein, such as nuts, beans, yogurt, and cheese. Shopping Do not buy foods labeled as diet, low-calorie, or low-fat. If you are able to eat dairy products: Avoid low-fat or skim milk. Buy dairy products that have at least 2% fat. Buy nutritional supplement drinks. Buy grains and prepared foods labeled as enriched or fortified. Consider buying low-sodium, pre-made foods to conserve energy for eating. Cooking Add dry milk or protein powder to smoothies. Cook with healthy fats, such as olive oil, canola oil, sunflower oil, and grapeseed oil. Add oil, butter, cream cheese, or nut butters to foods to increase fat and calories. To make foods easier to chew and swallow: Cook vegetables, pasta, and rice until soft. Cut or grind meat into very small pieces. Dip breads in liquid. Meal planning  Eat when you feel hungry. Eat 5-6 small meals throughout the day. Drink 6-8 glasses of water each day. Do not drink liquids with meals. Drink liquids at the end of the meal to avoid feeling full too quickly. Eat a variety of fruits and vegetables every day. Ask for assistance from family or friends with planning and preparing meals as needed. Avoid foods that cause you to feel bloated, such as carbonated drinks, fried foods, beans, broccoli, cabbage, and apples. For older adults, ask your local  agency on aging whether you are eligible for meal assistance programs, such as Meals on Wheels. Lifestyle  Do not smoke. Eat slowly. Take small bites and chew food well before swallowing. Do not overeat. This may make it more difficult to breathe after eating. Sit up while eating. If needed, continue to use supplemental oxygen while  eating. Rest or relax for 30 minutes before and after eating. Monitor your weight as told by your health care provider. Exercise as told by your health care provider. What foods should I eat? Fruits All fresh, dried, canned, or frozen fruits that do not cause gas. Vegetables All fresh, canned (no salt added), or frozen vegetables that do not cause gas. Grains Whole-grain bread. Enriched whole-grain pasta. Fortified whole-grain cereals. Fortified rice. Quinoa. Meats and other proteins Lean meat. Poultry. Fish. Dried beans. Unsalted nuts. Tofu. Eggs. Nut butters. Dairy Whole or 2% milk. Cheese. Yogurt. Fats and oils Olive oil. Canola oil. Butter. Margarine. Beverages Water. Vegetable juice (no salt added). Decaffeinated coffee. Decaffeinated or herbal tea. Seasonings and condiments Fresh or dried herbs. Low-salt or salt-free seasonings. Low-sodium soy sauce. The items listed above may not be a complete list of foods and beverages you can eat. Contact a dietitian for more information. What foods should I avoid? Fruits Fruits that cause gas, such as apples or melon. Vegetables Vegetables that cause gas, such as broccoli, Brussels sprouts, cabbage, cauliflower, and onions. Canned vegetables with added salt. Meats and other proteins Fried meat. Salt-cured meat. Processed meat. Dairy Fat-free or low-fat milk, yogurt, or cheese. Processed cheese. Beverages Carbonated drinks. Caffeinated drinks, such as coffee, tea, and soft drinks. Juice. Alcohol. Vegetable juice with added salt. Seasonings and condiments Salt. Seasoning mixes with salt. Soy sauce. Rosita Fire. Other foods Clear soup or broth. Fried foods. Prepared frozen meals. The items listed above may not be a complete list of foods and beverages you should avoid. Contact a dietitian for more information. Summary COPD symptoms can make it difficult to eat enough to maintain a healthy weight. A COPD eating plan can help you maintain  your body weight and keep your lungs as healthy as possible. Eat a diet that is high in calories, protein, and other nutrients. Read labels to make sure that you are getting the right nutrients. Cook foods to make them easier to chew and swallow. Eat 5-6 small meals throughout the day, and avoid foods that cause gas or make you feel bloated. This information is not intended to replace advice given to you by your health care provider. Make sure you discuss any questions you have with your health care provider. Document Revised: 09/12/2023 Document Reviewed: 09/12/2023 Elsevier Patient Education  2024 [employer].

## 2024-12-07 ENCOUNTER — Ambulatory Visit: Payer: MEDICAID | Admitting: Nurse Practitioner

## 2024-12-07 ENCOUNTER — Encounter: Payer: Self-pay | Admitting: Nurse Practitioner

## 2024-12-07 VITALS — BP 138/82 | HR 82 | Temp 97.9°F | Ht 70.0 in | Wt 189.4 lb

## 2024-12-07 DIAGNOSIS — F1111 Opioid abuse, in remission: Secondary | ICD-10-CM | POA: Diagnosis not present

## 2024-12-07 DIAGNOSIS — Z23 Encounter for immunization: Secondary | ICD-10-CM

## 2024-12-07 DIAGNOSIS — I700 Atherosclerosis of aorta: Secondary | ICD-10-CM

## 2024-12-07 DIAGNOSIS — I10 Essential (primary) hypertension: Secondary | ICD-10-CM | POA: Diagnosis not present

## 2024-12-07 DIAGNOSIS — F209 Schizophrenia, unspecified: Secondary | ICD-10-CM

## 2024-12-07 DIAGNOSIS — G4733 Obstructive sleep apnea (adult) (pediatric): Secondary | ICD-10-CM

## 2024-12-07 DIAGNOSIS — F17210 Nicotine dependence, cigarettes, uncomplicated: Secondary | ICD-10-CM

## 2024-12-07 DIAGNOSIS — J438 Other emphysema: Secondary | ICD-10-CM

## 2024-12-07 DIAGNOSIS — E782 Mixed hyperlipidemia: Secondary | ICD-10-CM | POA: Diagnosis not present

## 2024-12-07 DIAGNOSIS — F10288 Alcohol dependence with other alcohol-induced disorder: Secondary | ICD-10-CM | POA: Diagnosis not present

## 2024-12-07 DIAGNOSIS — Z8673 Personal history of transient ischemic attack (TIA), and cerebral infarction without residual deficits: Secondary | ICD-10-CM | POA: Diagnosis not present

## 2024-12-07 LAB — MICROALBUMIN, URINE WAIVED
Creatinine, Urine Waived: 50 mg/dL (ref 10–300)
Microalb, Ur Waived: 30 mg/L — ABNORMAL HIGH (ref 0–19)

## 2024-12-07 NOTE — Assessment & Plan Note (Signed)
I have recommended complete cessation of tobacco use. I have discussed various options available for assistance with tobacco cessation including over the counter methods (Nicotine gum, patch and lozenges). We also discussed prescription options (Chantix, Nicotine Inhaler / Nasal Spray). The patient is not interested in pursuing any prescription tobacco cessation options at this time.  Continue yearly lung CA screening.

## 2024-12-07 NOTE — Assessment & Plan Note (Signed)
 Chronic, ongoing.  BP slightly above goal, does not always take medication before visits. Continue to recommend reduction to cessation of alcohol use, however he continues to drink heavily and refuses AA or medications. Recommend he monitor BP at least a few mornings a week at home and document.  DASH diet at home.  Labs today: CBC,CMP, urine ALB.  Could consider Hydralazine  in future or change to Valsartan from Olmesartan  -- discussed with him.  See what labs show and discuss with cardiology.

## 2024-12-07 NOTE — Assessment & Plan Note (Signed)
 Chronic, ongoing. Continue to collaborate with pulmonary and medication regimen as ordered by them.  Also discussed that alcohol can effect the abx, to cut back on use.  Continue Breztri  BID and Albuterol  as needed at this time.  Continue yearly lung screening.  Recommend complete cessation of smoking.  He did not get Dupixent  as does not want to use mail order due to concerns about giving someone his card information.

## 2024-12-07 NOTE — Progress Notes (Signed)
 "  BP 138/82 (BP Location: Left Arm, Patient Position: Sitting, Cuff Size: Normal)   Pulse 82   Temp 97.9 F (36.6 C) (Oral)   Ht 5' 10 (1.778 m)   Wt 189 lb 6.4 oz (85.9 kg)   SpO2 94%   BMI 27.18 kg/m    Subjective:    Patient ID: Luke FORBES Lenord Mickey., male    DOB: [birth year], 64 y.o.   MRN: [id number]  HPI: Luke Ramus. is a 64 y.o. male  Chief Complaint  Patient presents with   Hyperlipidemia   Hypertension   COPD   Schizophrenia   HYPERTENSION without Chronic Kidney Disease  Last cardiology visit was on 07/21/24 with no changes. Continues on Olmesartan /HCTZ 40/25 MG and Rosuvastatin . Echo showed EF 50-55% and G1 DD on 08/12/23. He has a history of a CVA, remote lacunar infarct noted on imaging 09/28/20. Last vascular visit was on 03/01/24, ultrasound with RICA 1-39% and LICA 40-59%. Continues to drink alcohol daily, 8-12 [place] a day.  Will skip days on occasion. Reports drinking helps his tinnitus. Feels worse if he does not drink.   HISTORY: Anaphylaxis with Lisinopril , Carvedilol  N&V, Amlodipine  dizziness. Hypertension status: varies Satisfied with current treatment? yes Duration of hypertension: chronic BP monitoring frequency:  not checking BP range:  BP medication side effects:  no Medication compliance: good compliance Aspirin : no Recurrent headaches: no Visual changes: no Palpitations: no Dyspnea: ongoing issue, is following with pulmonary Chest pain: no Lower extremity edema: no Dizzy/lightheaded: occasional -- passed out in bathroom x 2 since last visit with no injuries The 10-year ASCVD risk score (Arnett DK, et al., 2019) is: 12%   Values used to calculate the score:     Age: 47 years     Clinically relevant sex: Male     Is Non-Hispanic African American: No     Diabetic: No     Tobacco smoker: Yes     Systolic Blood Pressure: 138 mmHg     Is BP treated: Yes     HDL Cholesterol: 64 mg/dL     Total Cholesterol: 142 mg/dL    COPD Follows with pulmonary, last visit on 11/1/0/25.  Continues use of Breztri  BID and using Albuterol  once or twice a day. Continues to smoke 1 PPD.  Has smoked for >46 years. Was given Azithromycin  to take MWF. They ordered Dupixent , they want to send via mail and need card number, but he does not trust that. Takes Vitamin D  and B12 supplements daily. COPD status: stable Satisfied with current treatment?: yes Oxygen  use: no Dyspnea frequency: yes Cough frequency: at baseline  Rescue inhaler frequency: every other day Limitation of activity: no Productive cough: none Last Spirometry: with pulmonary Pneumovax: Up to Date Influenza: Up to Date   SCHIZOPHRENIA No current medications. Feels like he does well without medication. Mood status: stable Symptom severity: moderate  Psychotherapy/counseling: no none Depressed mood: sometimes Anxious mood: yes Anhedonia: no Significant weight loss or gain: no Insomnia: yes hard to fall asleep Fatigue: yes Feelings of worthlessness or guilt: yes Impaired concentration/indecisiveness: no Suicidal ideations: no Hopelessness: yes Crying spells: yes    12/07/2024    8:15 AM 08/06/2024    8:59 AM 05/04/2024    8:00 AM 01/30/2024    8:37 AM 12/24/2023    9:03 AM  Depression screen PHQ 2/9  Decreased Interest 2 3 2 2 3   Down, Depressed, Hopeless 1 2 2 3 2   PHQ - 2 Score 3 5  4 5 5   Altered sleeping 2 3 2 2 2   Tired, decreased energy 2 3 3 3 3   Change in appetite 1 1 1 2 1   Feeling bad or failure about yourself  1 1 2 3 2   Trouble concentrating 1 2 2 2 1   Moving slowly or fidgety/restless 1 2 2  0 0  Suicidal thoughts 0 0 0 2 0  PHQ-9 Score 11 17  16  19  14    Difficult doing work/chores Somewhat difficult  Very difficult Very difficult Very difficult     Data saved with a previous flowsheet row definition       12/07/2024    8:16 AM 08/06/2024    8:58 AM 05/04/2024    8:01 AM 01/30/2024    8:37 AM  GAD 7 : Generalized Anxiety Score   Nervous, Anxious, on Edge 1 2  2  2    Control/stop worrying 1 1  2  1    Worry too much - different things 1 1  2  2    Trouble relaxing 1 2  2  2    Restless 0 1  1  1    Easily annoyed or irritable 2 3  2  2    Afraid - awful might happen 2 3  2  3    Total GAD 7 Score 8 13 13 13   Anxiety Difficulty Somewhat difficult  Very difficult Very difficult     Data saved with a previous flowsheet row definition   Relevant past medical, surgical, family and social history reviewed and updated as indicated. Interim medical history since our last visit reviewed. Allergies and medications reviewed and updated.  Review of Systems  Constitutional:  Positive for fatigue. Negative for activity change, diaphoresis and fever.  Respiratory:  Positive for cough (chronic) and shortness of breath (at baseline, no worse). Negative for chest tightness and wheezing.   Cardiovascular:  Negative for chest pain, palpitations and leg swelling.  Gastrointestinal: Negative.   Musculoskeletal: Negative.   Skin: Negative.   Neurological: Negative.   Psychiatric/Behavioral: Negative.     Per HPI unless specifically indicated above     Objective:    BP 138/82 (BP Location: Left Arm, Patient Position: Sitting, Cuff Size: Normal)   Pulse 82   Temp 97.9 F (36.6 C) (Oral)   Ht 5' 10 (1.778 m)   Wt 189 lb 6.4 oz (85.9 kg)   SpO2 94%   BMI 27.18 kg/m   Wt Readings from Last 3 Encounters:  12/07/24 189 lb 6.4 oz (85.9 kg)  09/27/24 188 lb 12.8 oz (85.6 kg)  08/06/24 189 lb 6.4 oz (85.9 kg)    Physical Exam Vitals and nursing note reviewed.  Constitutional:      General: He is awake. He is not in acute distress.    Appearance: He is well-developed and well-groomed. He is not ill-appearing or toxic-appearing.  HENT:     Head: Normocephalic.     Right Ear: Hearing and external ear normal.     Left Ear: Hearing and external ear normal.  Eyes:     General: Lids are normal. No visual field deficit.     Extraocular Movements: Extraocular movements intact.     Conjunctiva/sclera: Conjunctivae normal.  Neck:     Thyroid: No thyromegaly.     Vascular: No carotid bruit.  Cardiovascular:     Rate and Rhythm: Normal rate and regular rhythm.     Heart sounds: Normal heart sounds. No murmur heard.    No  gallop.  Pulmonary:     Effort: Pulmonary effort is normal. No accessory muscle usage or respiratory distress.     Breath sounds: Wheezing present. No decreased breath sounds or rales.     Comments: Expiratory wheezes noted throughout, per baseline. Abdominal:     General: Bowel sounds are normal. There is no distension.     Palpations: Abdomen is soft.     Tenderness: There is no abdominal tenderness.  Musculoskeletal:     Cervical back: Full passive range of motion without pain.     Right lower leg: No edema.     Left lower leg: No edema.  Lymphadenopathy:     Cervical: No cervical adenopathy.  Skin:    General: Skin is warm.     Capillary Refill: Capillary refill takes less than 2 seconds.     Findings: No rash.  Neurological:     Mental Status: He is alert and oriented to person, place, and time.     Cranial Nerves: No facial asymmetry.     Sensory: No sensory deficit.     Motor: Tremor (both hands, baseline) present. No weakness.     Coordination: Coordination is intact.     Gait: Gait is intact.     Deep Tendon Reflexes: Reflexes are normal and symmetric.     Reflex Scores:      Brachioradialis reflexes are 2+ on the right side and 2+ on the left side.      Patellar reflexes are 2+ on the right side and 2+ on the left side. Psychiatric:        Attention and Perception: Attention normal.        Mood and Affect: Mood normal.        Speech: Speech normal.        Behavior: Behavior is cooperative.        Thought Content: Thought content normal.    Results for orders placed or performed in visit on 08/06/24  CBC with Differential/Platelet   Collection Time: 08/06/24  9:00 AM   Result Value Ref Range   WBC 8.8 3.4 - 10.8 x10E3/uL   RBC 4.77 4.14 - 5.80 x10E6/uL   Hemoglobin 14.7 13.0 - 17.7 g/dL   Hematocrit 55.6 62.4 - 51.0 %   MCV 93 79 - 97 fL   MCH 30.8 26.6 - 33.0 pg   MCHC 33.2 31.5 - 35.7 g/dL   RDW 87.6 88.3 - 84.5 %   Platelets 329 150 - 450 x10E3/uL   Neutrophils 66 Not Estab. %   Lymphs 23 Not Estab. %   Monocytes 9 Not Estab. %   Eos 1 Not Estab. %   Basos 1 Not Estab. %   Neutrophils Absolute 5.9 1.4 - 7.0 x10E3/uL   Lymphocytes Absolute 2.1 0.7 - 3.1 x10E3/uL   Monocytes Absolute 0.8 0.1 - 0.9 x10E3/uL   EOS (ABSOLUTE) 0.1 0.0 - 0.4 x10E3/uL   Basophils Absolute 0.0 0.0 - 0.2 x10E3/uL   Immature Granulocytes 0 Not Estab. %   Immature Grans (Abs) 0.0 0.0 - 0.1 x10E3/uL  Comprehensive metabolic panel with GFR   Collection Time: 08/06/24  9:00 AM  Result Value Ref Range   Glucose 91 70 - 99 mg/dL   BUN 10 8 - 27 mg/dL   Creatinine, Ser 9.03 0.76 - 1.27 mg/dL   eGFR 90 >40 fO/fpw/8.26   BUN/Creatinine Ratio 10 10 - 24   Sodium 133 (L) 134 - 144 mmol/L   Potassium 4.4 3.5 - 5.2  mmol/L   Chloride 96 96 - 106 mmol/L   CO2 22 20 - 29 mmol/L   Calcium  9.5 8.6 - 10.2 mg/dL   Total Protein 6.8 6.0 - 8.5 g/dL   Albumin 4.2 3.9 - 4.9 g/dL   Globulin, Total 2.6 1.5 - 4.5 g/dL   Bilirubin Total 1.0 0.0 - 1.2 mg/dL   Alkaline Phosphatase 84 47 - 123 IU/L   AST 29 0 - 40 IU/L   ALT 23 0 - 44 IU/L  TSH   Collection Time: 08/06/24  9:00 AM  Result Value Ref Range   TSH 0.847 0.450 - 4.500 uIU/mL  PSA   Collection Time: 08/06/24  9:00 AM  Result Value Ref Range   Prostate Specific Ag, Serum 0.3 0.0 - 4.0 ng/mL  Magnesium    Collection Time: 08/06/24  9:00 AM  Result Value Ref Range   Magnesium  1.8 1.6 - 2.3 mg/dL  Lipid Panel w/o Chol/HDL Ratio   Collection Time: 08/06/24  9:00 AM  Result Value Ref Range   Cholesterol, Total 142 100 - 199 mg/dL   Triglycerides 71 0 - 149 mg/dL   HDL 64 >60 mg/dL   VLDL Cholesterol Cal 14 5 - 40 mg/dL    LDL Chol Calc (NIH) 64 0 - 99 mg/dL  VITAMIN D  25 Hydroxy (Vit-D Deficiency, Fractures)   Collection Time: 08/06/24  9:00 AM  Result Value Ref Range   Vit D, 25-Hydroxy 33.5 30.0 - 100.0 ng/mL  Folate   Collection Time: 08/06/24  9:00 AM  Result Value Ref Range   Folate 9.9 >3.0 ng/mL  Vitamin B12   Collection Time: 08/06/24  9:00 AM  Result Value Ref Range   Vitamin B-12 624 232 - 1,245 pg/mL      Assessment & Plan:   Problem List Items Addressed This Visit       Cardiovascular and Mediastinum   Essential hypertension   Chronic, ongoing.  BP slightly above goal, does not always take medication before visits. Continue to recommend reduction to cessation of alcohol use, however he continues to drink heavily and refuses AA or medications. Recommend he monitor BP at least a few mornings a week at home and document.  DASH diet at home.  Labs today: CBC,CMP, urine ALB.  Could consider Hydralazine  in future or change to Valsartan from Olmesartan  -- discussed with him.  See what labs show and discuss with cardiology.       Relevant Orders   Comprehensive metabolic panel with GFR   Aortic atherosclerosis   Chronic, noted on CT lung screening.  Continue statin therapy + recommend complete cessation of smoking.  Lipid panel today.        Respiratory   Paraseptal emphysema (HCC) - Primary   Chronic, ongoing. Continue to collaborate with pulmonary and medication regimen as ordered by them.  Also discussed that alcohol can effect the abx, to cut back on use.  Continue Breztri  BID and Albuterol  as needed at this time.  Continue yearly lung screening.  Recommend complete cessation of smoking.  He did not get Dupixent  as does not want to use mail order due to concerns about giving someone his card information.      Relevant Orders   CBC with Differential/Platelet     Other   Schizophrenia (HCC)   Chronic, ongoing.  No current medications.  Tolerating this and does not wish to start  medication.  Could consider Seroquel in future if needed or return to Clonidine .  Denies SI/HI.  Nicotine  dependence   I have recommended complete cessation of tobacco use. I have discussed various options available for assistance with tobacco cessation including over the counter methods (Nicotine  gum, patch and lozenges). We also discussed prescription options (Chantix, Nicotine  Inhaler / Nasal Spray). The patient is not interested in pursuing any prescription tobacco cessation options at this time.  Continue yearly lung CA screening.       Mixed hyperlipidemia   Chronic, ongoing.  Recommend he continue Rosuvastatin  daily and will adjust as needed.  Lipid panel today.       Relevant Orders   Comprehensive metabolic panel with GFR   Lipid Panel w/o Chol/HDL Ratio   Microalbumin, Urine Waived   History of opioid abuse (HCC)   Quit taking opioids years ago, at this time continues to remain off these per report -- monitor closely.       History of lacunar cerebrovascular accident   History of infarct, continue collaboration with neuro and ENT for ongoing tinnitus, headaches, and dizziness.  Recent MRI remains stable.  Highly recommend he return for neurology visits.      Alcohol dependence (HCC)   Ongoing and endorses at all visits he feels bad all the time.  Recommend cutting back -- concern for overall health with ongoing heavy use and suspect major reason for his overall feeling bad at baseline. Discussed with him that with aging the liver cannot process alcohol like it once could which means it will stay in system for longer periods.  Main trigger is his significant other also drinks.  Is aware of effect of heavy alcohol use on overall health, especially BP and liver health.  LABS: CBC and CMP.  Refuses AA or medication at this time. We discussed at length that if ongoing heavy alcohol use it could lead to poor outcomes in the future.      Relevant Orders   Comprehensive metabolic  panel with GFR   Other Visit Diagnoses       Need for pneumococcal 20-valent conjugate vaccination       PCV20 in office today, discussed with patient.   Relevant Orders   Pneumococcal conjugate vaccine 20-valent (Prevnar 20) (Completed)     Need for influenza vaccination       Flu vaccine in office today, educated patient.   Relevant Orders   Flu vaccine trivalent PF, 6mos and older(Flulaval,Afluria,Fluarix,Fluzone) (Completed)        Follow up plan: Return in about 5 months (around 05/07/2025) for HTN/HLD, COPD, Depression.      "

## 2024-12-07 NOTE — Assessment & Plan Note (Addendum)
 History of infarct, continue collaboration with neuro and ENT for ongoing tinnitus, headaches, and dizziness.  Recent MRI remains stable.  Highly recommend he return for neurology visits.

## 2024-12-07 NOTE — Assessment & Plan Note (Signed)
Chronic, noted on CT lung screening.  Continue statin therapy + recommend complete cessation of smoking.  Lipid panel today. 

## 2024-12-07 NOTE — Assessment & Plan Note (Signed)
Chronic, ongoing.  No current medications.  Tolerating this and does not wish to start medication.  Could consider Seroquel in future if needed or return to Clonidine.  Denies SI/HI.

## 2024-12-07 NOTE — Assessment & Plan Note (Signed)
 Chronic, ongoing.  Recommend he continue Rosuvastatin  daily and will adjust as needed.  Lipid panel today.

## 2024-12-07 NOTE — Assessment & Plan Note (Signed)
 Ongoing and endorses at all visits he feels bad all the time.  Recommend cutting back -- concern for overall health with ongoing heavy use and suspect major reason for his overall feeling bad at baseline. Discussed with him that with aging the liver cannot process alcohol like it once could which means it will stay in system for longer periods.  Main trigger is his significant other also drinks.  Is aware of effect of heavy alcohol use on overall health, especially BP and liver health.  LABS: CBC and CMP.  Refuses AA or medication at this time. We discussed at length that if ongoing heavy alcohol use it could lead to poor outcomes in the future.

## 2024-12-07 NOTE — Assessment & Plan Note (Signed)
 Quit taking opioids years ago, at this time continues to remain off these per report -- monitor closely.

## 2024-12-08 ENCOUNTER — Ambulatory Visit: Payer: Self-pay | Admitting: Nurse Practitioner

## 2024-12-08 LAB — CBC WITH DIFFERENTIAL/PLATELET
Basophils Absolute: 0.1 x10E3/uL (ref 0.0–0.2)
Basos: 1 %
EOS (ABSOLUTE): 0.1 x10E3/uL (ref 0.0–0.4)
Eos: 1 %
Hematocrit: 46.1 % (ref 37.5–51.0)
Hemoglobin: 15.2 g/dL (ref 13.0–17.7)
Immature Grans (Abs): 0 x10E3/uL (ref 0.0–0.1)
Immature Granulocytes: 0 %
Lymphocytes Absolute: 2.2 x10E3/uL (ref 0.7–3.1)
Lymphs: 26 %
MCH: 31.3 pg (ref 26.6–33.0)
MCHC: 33 g/dL (ref 31.5–35.7)
MCV: 95 fL (ref 79–97)
Monocytes Absolute: 0.6 x10E3/uL (ref 0.1–0.9)
Monocytes: 8 %
Neutrophils Absolute: 5.3 x10E3/uL (ref 1.4–7.0)
Neutrophils: 64 %
Platelets: 340 x10E3/uL (ref 150–450)
RBC: 4.86 x10E6/uL (ref 4.14–5.80)
RDW: 12.2 % (ref 11.6–15.4)
WBC: 8.3 x10E3/uL (ref 3.4–10.8)

## 2024-12-08 LAB — COMPREHENSIVE METABOLIC PANEL WITH GFR
ALT: 23 IU/L (ref 0–44)
AST: 26 IU/L (ref 0–40)
Albumin: 4.2 g/dL (ref 3.9–4.9)
Alkaline Phosphatase: 83 IU/L (ref 47–123)
BUN/Creatinine Ratio: 12 (ref 10–24)
BUN: 11 mg/dL (ref 8–27)
Bilirubin Total: 0.8 mg/dL (ref 0.0–1.2)
CO2: 24 mmol/L (ref 20–29)
Calcium: 9.4 mg/dL (ref 8.6–10.2)
Chloride: 94 mmol/L — ABNORMAL LOW (ref 96–106)
Creatinine, Ser: 0.95 mg/dL (ref 0.76–1.27)
Globulin, Total: 3 g/dL (ref 1.5–4.5)
Glucose: 98 mg/dL (ref 70–99)
Potassium: 4.8 mmol/L (ref 3.5–5.2)
Sodium: 133 mmol/L — ABNORMAL LOW (ref 134–144)
Total Protein: 7.2 g/dL (ref 6.0–8.5)
eGFR: 91 mL/min/1.73

## 2024-12-08 LAB — LIPID PANEL W/O CHOL/HDL RATIO
Cholesterol, Total: 153 mg/dL (ref 100–199)
HDL: 65 mg/dL
LDL Chol Calc (NIH): 75 mg/dL (ref 0–99)
Triglycerides: 64 mg/dL (ref 0–149)
VLDL Cholesterol Cal: 13 mg/dL (ref 5–40)

## 2024-12-08 NOTE — Progress Notes (Signed)
 Contacted via MyChart  Good morning Luke Briggs, your labs have returned and overall remain stable with exception of slightly low sodium,salt, level. Please ensure to continue to cut back on alcohol use, but I do not want you to add too much salt to diet as this may increase blood pressure more. Any questions? Keep being amazing!!  Thank you for allowing me to participate in your care.  I appreciate you. Kindest regards, Shine Scrogham

## 2024-12-22 MED ORDER — ALBUTEROL SULFATE HFA 108 (90 BASE) MCG/ACT IN AERS: INHALATION_SPRAY | RESPIRATORY_TRACT | 5 refills | Status: AC

## 2025-01-31 ENCOUNTER — Ambulatory Visit: Payer: MEDICAID | Admitting: Pulmonary Disease

## 2025-03-01 ENCOUNTER — Encounter (INDEPENDENT_AMBULATORY_CARE_PROVIDER_SITE_OTHER): Payer: MEDICAID

## 2025-03-01 ENCOUNTER — Ambulatory Visit (INDEPENDENT_AMBULATORY_CARE_PROVIDER_SITE_OTHER): Payer: MEDICAID | Admitting: Nurse Practitioner

## 2025-05-09 ENCOUNTER — Ambulatory Visit: Payer: MEDICAID | Admitting: Nurse Practitioner

## 2025-06-07 ENCOUNTER — Ambulatory Visit: Payer: MEDICAID | Admitting: Urology
